# Patient Record
Sex: Male | Born: 1945
Health system: Southern US, Community
[De-identification: ages and names within clinical notes are randomized; demographics above are authoritative.]

## PROBLEM LIST (undated history)

## (undated) DIAGNOSIS — Z952 Presence of prosthetic heart valve: Secondary | ICD-10-CM

## (undated) DIAGNOSIS — R0600 Dyspnea, unspecified: Secondary | ICD-10-CM

## (undated) DIAGNOSIS — M7989 Other specified soft tissue disorders: Secondary | ICD-10-CM

## (undated) DIAGNOSIS — I10 Essential (primary) hypertension: Secondary | ICD-10-CM

## (undated) DIAGNOSIS — I4891 Unspecified atrial fibrillation: Secondary | ICD-10-CM

## (undated) DIAGNOSIS — I499 Cardiac arrhythmia, unspecified: Secondary | ICD-10-CM

## (undated) DIAGNOSIS — G4733 Obstructive sleep apnea (adult) (pediatric): Secondary | ICD-10-CM

## (undated) DIAGNOSIS — I82409 Acute embolism and thrombosis of unspecified deep veins of unspecified lower extremity: Secondary | ICD-10-CM

## (undated) HISTORY — PX: CARDIAC SURGERY: SHX584

## (undated) HISTORY — DX: Obstructive sleep apnea (adult) (pediatric): G47.33

## (undated) HISTORY — PX: EYE SURGERY: SHX253

## (undated) HISTORY — PX: THYROIDECTOMY, PARTIAL: SHX18

## (undated) HISTORY — DX: Other specified soft tissue disorders: M79.89

## (undated) HISTORY — PX: OTHER SURGICAL HISTORY: SHX169

## (undated) HISTORY — DX: Presence of prosthetic heart valve: Z95.2

---

## 2007-04-08 HISTORY — PX: NM MYOVIEW LTD: HXRAD82

## 2007-05-03 ENCOUNTER — Encounter: Admission: RE | Admit: 2007-05-03 | Discharge: 2007-05-03 | Payer: Self-pay | Admitting: *Deleted

## 2007-05-10 ENCOUNTER — Encounter: Payer: Self-pay | Admitting: Surgery

## 2007-05-10 ENCOUNTER — Ambulatory Visit (HOSPITAL_COMMUNITY): Admission: RE | Admit: 2007-05-10 | Discharge: 2007-05-10 | Payer: Self-pay | Admitting: *Deleted

## 2007-05-10 HISTORY — PX: CARDIAC CATHETERIZATION: SHX172

## 2007-05-14 ENCOUNTER — Ambulatory Visit: Payer: Self-pay | Admitting: Surgery

## 2007-05-16 ENCOUNTER — Ambulatory Visit: Admission: RE | Admit: 2007-05-16 | Discharge: 2007-05-16 | Payer: Self-pay | Admitting: Surgery

## 2007-05-22 ENCOUNTER — Encounter: Payer: Self-pay | Admitting: Surgery

## 2007-05-22 ENCOUNTER — Inpatient Hospital Stay (HOSPITAL_COMMUNITY): Admission: RE | Admit: 2007-05-22 | Discharge: 2007-05-27 | Payer: Self-pay | Admitting: Surgery

## 2007-05-23 ENCOUNTER — Ambulatory Visit: Payer: Self-pay | Admitting: Surgery

## 2007-06-11 ENCOUNTER — Encounter: Admission: RE | Admit: 2007-06-11 | Discharge: 2007-06-11 | Payer: Self-pay | Admitting: Surgery

## 2007-06-11 ENCOUNTER — Ambulatory Visit: Payer: Self-pay | Admitting: Surgery

## 2007-06-20 ENCOUNTER — Encounter (HOSPITAL_COMMUNITY): Admission: RE | Admit: 2007-06-20 | Discharge: 2007-09-08 | Payer: Self-pay | Admitting: *Deleted

## 2007-07-16 ENCOUNTER — Ambulatory Visit: Payer: Self-pay | Admitting: Surgery

## 2007-08-02 ENCOUNTER — Ambulatory Visit (HOSPITAL_COMMUNITY): Admission: RE | Admit: 2007-08-02 | Discharge: 2007-08-02 | Payer: Self-pay | Admitting: *Deleted

## 2007-08-02 HISTORY — PX: CARDIOVERSION: SHX1299

## 2007-10-25 DIAGNOSIS — G4733 Obstructive sleep apnea (adult) (pediatric): Secondary | ICD-10-CM

## 2007-10-25 HISTORY — DX: Obstructive sleep apnea (adult) (pediatric): G47.33

## 2008-08-17 ENCOUNTER — Ambulatory Visit (HOSPITAL_COMMUNITY): Admission: RE | Admit: 2008-08-17 | Discharge: 2008-08-17 | Payer: Self-pay | Admitting: Cardiology

## 2009-08-22 ENCOUNTER — Encounter (INDEPENDENT_AMBULATORY_CARE_PROVIDER_SITE_OTHER): Payer: Self-pay | Admitting: Emergency Medicine

## 2009-08-22 ENCOUNTER — Ambulatory Visit: Payer: Self-pay | Admitting: Surgery

## 2009-08-22 ENCOUNTER — Emergency Department (HOSPITAL_COMMUNITY): Admission: EM | Admit: 2009-08-22 | Discharge: 2009-08-22 | Payer: Self-pay | Admitting: Emergency Medicine

## 2009-12-03 ENCOUNTER — Ambulatory Visit (HOSPITAL_COMMUNITY): Admission: RE | Admit: 2009-12-03 | Discharge: 2009-12-03 | Payer: Self-pay | Admitting: Gastroenterology

## 2010-06-27 LAB — URINALYSIS, ROUTINE W REFLEX MICROSCOPIC
Bilirubin Urine: NEGATIVE
Glucose, UA: NEGATIVE mg/dL
Hgb urine dipstick: NEGATIVE
Ketones, ur: NEGATIVE mg/dL
Nitrite: NEGATIVE
Protein, ur: NEGATIVE mg/dL
Specific Gravity, Urine: 1.011 (ref 1.005–1.030)
Urobilinogen, UA: 0.2 mg/dL (ref 0.0–1.0)
pH: 8 (ref 5.0–8.0)

## 2010-06-27 LAB — POCT I-STAT, CHEM 8
BUN: 13 mg/dL (ref 6–23)
Calcium, Ion: 1.18 mmol/L (ref 1.12–1.32)
Chloride: 105 mEq/L (ref 96–112)
Creatinine, Ser: 0.8 mg/dL (ref 0.4–1.5)
Glucose, Bld: 93 mg/dL (ref 70–99)
HCT: 38 % — ABNORMAL LOW (ref 39.0–52.0)
Hemoglobin: 12.9 g/dL — ABNORMAL LOW (ref 13.0–17.0)
Potassium: 4.3 mEq/L (ref 3.5–5.1)
Sodium: 141 mEq/L (ref 135–145)
TCO2: 27 mmol/L (ref 0–100)

## 2010-06-27 LAB — PROTIME-INR
INR: 3.79 — ABNORMAL HIGH (ref 0.00–1.49)
Prothrombin Time: 37.1 seconds — ABNORMAL HIGH (ref 11.6–15.2)

## 2010-06-27 LAB — APTT

## 2010-08-23 NOTE — Assessment & Plan Note (Signed)
OFFICE VISIT   TRAVER, MECKES  DOB:  05-28-1945                                        June 11, 2007  CHART #:  [id number]   Mr. Astacio returned today for a followup status post coronary artery  bypass graft surgery x2 and Bentall procedure with a 25 mm St. Jude  mechanical valve conduit on May 22, 2007.  His postoperative course  was uncomplicated and he was discharged home on Coumadin.  His INR has  been followed in our office since surgery.  He is currently on 7.5 mg of  Coumadin per day and his last INR checked on Monday was 2.4.  He has  been walking daily without chest pain or shortness of breath.   PHYSICAL EXAMINATION:  His blood pressure is 129/77, pulse 58 and  regular.  Respiratory rate is 18, unlabored.  Oxygen saturation on room  air is 98%.  He looks well.  CARDIAC:  Shows a regular rate and rhythm with normal heart sounds.  There is a crisp mechanical valve click.  There is no murmur.  LUNGS:  Clear.  The chest incision is healing well and the sternum is  stable.  His leg incision is healing well and there is no lower extremity edema.   Followup chest x-ray today shows clear lung fields and no pleural  effusions.   MEDICATIONS:  1. Ramipril 10 mg daily.  2. Bystolic 2.5 mg daily.  3. Lipitor 10 mg daily.  4. Fluticasone nasal spray p.r.n.  5. Baby aspirin 81 mg daily.  6. Coumadin 7.5 mg daily.  7. Multivitamin daily.  8. Vitamin C 1000 mg daily.  9. Calcium with vitamin D 2400 mg daily.  10.Fish oil 1000 mg daily.  11.He had been on Lipitor 20 mg daily prior to surgery and I asked him      to restart that dose.   He has not been taking his oxycodone IR because it upset his stomach and  I wrote him a prescription today for Ultram 50 mg p.o. q.6 hours p.r.n.  for pain #30.   Overall, Mr. Newbury is recovering well following his surgery.  I  encouraged him to continue walking as much as possible. He is interested  in starting  cardiac rehab.  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  He works as a  [employer] and said that he has to lift about 100 pounds at work. I  told him he would have to wait three months to do that.  We will check  his coagulation profile again on June 13, 2007, and if his INR is stable  within the therapeutic window, then I will ask Dr. Mikey Bussing office to  follow his anticoagulation profile long-term.  I will plan to see him  back in about one month.   Evelene Croon, M.D.  Electronically Signed   BB/MEDQ  D:  06/11/2007  T:  06/11/2007  Job:  [id number]   cc:   Darlin Priestly, MD

## 2010-08-23 NOTE — Cardiovascular Report (Signed)
NAMESHOTA, KOHRS NO.:  [id number]   MEDICAL RECORD NO.:  [id number]          PATIENT TYPE:  OIB   LOCATION:  2859                         FACILITY:  [facility]   PHYSICIAN:  Darlin Priestly, MD  DATE OF BIRTH:  May 11, 1945   DATE OF PROCEDURE:  08/02/2007  DATE OF DISCHARGE:                            CARDIAC CATHETERIZATION   PROCEDURE:  DC cardioversion.   ATTENDING PHYSICIAN:  Darlin Priestly, MD.   COMPLICATIONS:  None.   INDICATIONS:  Mr. Arps is a 65 year old male with a history of ascending  aortic aneurysm with moderate-to-severe aortic insufficiency.  He  underwent a Bentall procedure with St. Jude aortic valve replacement.  Postop, he developed AFib.  He has been loaded on amiodarone and failed  to convert chemically.  He is now brought for a DC cardioversion.   DESCRIPTION OF PROCEDURE:  After getting informed consent, the patient  was brought to endoscopy suite in fasting state.  He then underwent DC  cardioversion.  He received 150 Joule biphasic shock with restoration of  sinus rhythm.  Heart rate was 60.  He remained hemodynamically stable.  He woke in satisfactory condition.   CONCLUSION:  Successful DC cardioversion to sinus rhythm.      Darlin Priestly, MD  Electronically Signed     RHM/MEDQ  D:  08/02/2007  T:  08/02/2007  Job:  [id number]

## 2010-08-23 NOTE — Op Note (Signed)
NAMEDOLORES, MCGOVERN NO.:  [id number]   MEDICAL RECORD NO.:  [id number]          PATIENT TYPE:  INP   LOCATION:  2006                         FACILITY:  [facility]   PHYSICIAN:  Evelene Croon, M.D.     DATE OF BIRTH:  1945-08-08   DATE OF PROCEDURE:  05/22/2007  DATE OF DISCHARGE:                               OPERATIVE REPORT   PREOPERATIVE DIAGNOSIS:  Ascending aortic aneurysm with severe aortic  insufficiency and two vessel coronary disease.   POSTOPERATIVE DIAGNOSIS:  Ascending aortic aneurysm with severe aortic  insufficiency and two vessel coronary disease.   OPERATIVE PROCEDURE:  Median sternotomy, extracorporeal circulation,  coronary bypass graft surgery x2 using saphenous vein grafts to the  obtuse marginal branch of the left circumflex coronary and the right  coronary artery; replacement of the aortic valve and ascending aortic  aneurysm using a 25 mm St. Jude mechanical valve conduit with  reimplantation of the right and left coronary artery using deep  hypothermic circulatory arrest (Bentall procedure) endoscopic vein  harvesting from the left leg.   SURGEON:  Evelene Croon, M.D.   ASSISTANT:  Rowe Clack, P.A.-C.   ANESTHESIA:  General endotracheal.   CLINICAL HISTORY:  This patient is a 65 year old gentleman with a  history of hypertension and known moderate aortic insufficiency with a  mildly dilated aortic root that had been followed with yearly  echocardiograms and CT scans.  Previous studies have shown normal left  ventricular function and no significant ischemia.  A repeat  echocardiogram on April 08, 2007, showed a mildly dilated left  ventricle with left ventricular systolic dimension that had now  increased to 4.5 cm which was about 1 cm larger than last year.  Ejection fraction remained normal.  He was found have moderate to severe  aortic insufficiency which had progressed.  There was mild mitral  regurgitation.  The aortic root  diameter was measured at 5.2 cm in the  past and repeat scan showed that increased to 5 x 5.5 cm.  There was no  evidence of aortic dissection.  CT scan of the abdomen and pelvis also  showed ectasia of the iliac arteries but no abdominal aortic aneurysm.  He underwent cardiac catheterization by Dr. Jenne Campus on May 10, 2007.  This showed the left main to be patent without significant disease.  The  LAD was a large vessel that had an insignificant 30% proximal narrowing.  There was a diagonal branch that had about 60% ostial stenosis in one  view.  The left circumflex was a large vessel that gave off one large  bifurcating branch.  There is about a 75% stenosis in the AV groove  portion of the left circumflex compromising this branch.  The right  coronary artery was a small to medium size vessel that was dominant.  There was 99% mid vessel stenosis as well as sequential 90% lesion in  the distal portion extending into the posterior descending branch.  The  posterolateral branch was not visualized off the right coronary artery  and collateralized from the AV groove portion of  the left circumflex.  Ejection fraction was about 50%.  Aortography showed a severely dilated  aortic root with moderate to severe aortic insufficiency.  Abdominal  aortogram showed normal renal arteries.  There were two small infrarenal  aortic aneurysms.  The iliac arteries appeared dilated and tortuous.  PA  pressure was 31/10 with a wedge pressure of 10 and a cardiac index of  2.7.  After review of these studies and examination of the patient in my  office, I felt that it would be best to proceed with coronary bypass to  the left circumflex and right coronary territories as well as  replacement of his aortic valve and the ascending aortic aneurysm using  a mechanical valve conduit.  I discussed the options for valve  replacement with he and his wife including mechanical and tissue valves  and I recommended a  mechanical valve given his age.  I discussed the  operative procedure in detail with them including alternatives,  benefits, and risks including but not limited to bleeding, blood  transfusion, infection, stroke, myocardial infarction, graft failure,  organ failure, and death.  They understood and agreed to proceed.   OPERATIVE PROCEDURE:  The patient was taken to the operating room and  placed on the table in a supine position.  After induction of general  endotracheal anesthesia, a Foley catheter was placed in the bladder  using sterile technique.  Preoperative intravenous antibiotics were  given.  Then, transesophageal echocardiogram was performed.  This showed  severe aortic insufficiency.  There was no aortic stenosis.  There was a  markedly dilated ascending aorta which was the largest in the aortic  root.  There was no significant mitral regurgitation.  There was mild  tricuspid regurgitation.  Left ventricular function appeared well  preserved.   Then, the chest, abdomen and both lower extremities were prepped and  draped in the usual sterile manner.  The chest was opened through a  median sternotomy incision and the pericardium opened in the midline.  Examination of the heart showed good ventricular contractility.  The  ascending aorta was aneurysmal and this was the largest in the aortic  root portion.  At the same time, a segment of greater saphenous vein was  harvested from the left thigh using endoscopic vein harvest technique.  This vein was medium size and good quality.   Then, the patient was heparinized and when an adequate activated  clotting time was achieved, the distal ascending aorta was cannulated  using a 20-French aortic cannula for arterial inflow.  Venous outflow  was achieved using a two stage venous cannula through the right atrial  appendage.  A retrograde cardioplegia cannula was inserted through the  right atrium and the coronary sinus.  A left  ventricular vent was placed  in the right superior pulmonary vein.  A retrograde cerebroplegia  cannula was placed through a pursestring suture in the superior vena  cava.  The superior vena cava was encircled with tape.   The patient was then placed on cardiopulmonary bypass and cooled to a  nasopharyngeal and bladder temperature of 18 degrees centigrade.  The  aorta was then crossclamped and 500 mL of cold blood retrograde  cardioplegia was administered with quick arrest of the heart.  Topical  hypothermia with iced saline was used.  A temperature probe was placed  in the septum and insulating pad in the pericardium.   While the patient was cooling to 18 degrees centigrade, the coronary  distal anastomoses  were performed.  The first anastomosis was performed  to the obtuse marginal branch of the left circumflex coronary.  The  internal diameter was 1.75 mm.  The conduit used was a segment of  greater saphenous vein and the anastomosis was performed in an end-to-  side manner using continuous 7-0 Prolene suture.  Flow was noted through  the graft and was excellent.   The second distal anastomosis was performed to the distal right  coronary.  The internal diameter was about 1.6 mm.  The posterior  descending and posterolateral branches, themselves, were small non-  graftable vessels.  The conduit used was a second segment of greater  saphenous vein.  The anastomosis was performed in an end-to-side manner  using continuous 7-0 Prolene suture.  Flow was noted through the graft  and was excellent.  Then, another dose of retrograde cardioplegia was  given.  About this time, the patient had cooled sufficiently to a  nasopharyngeal and bladder temperature of 18 degrees centigrade.  The  patient was given 200 mg of Solu-Medrol and a dose of Pentothal by  anesthesiology.  The head was packed in ice.  The patient was placed in  a steep Trendelenburg position.  Then, the circulatory arrest was  begun  and the patient's blood volume emptied back into the pump.  The aortic  cannula was removed.  The distal aorta was transected just proximal to  the innominate artery.  The aorta had normal caliber at this location.  Retrograde cerebroplegia was begun at the time of circulatory arrest and  continued throughout the circulatory arrest period.  There was good flow  of the retrograde cerebroplegia out of the head vessels.  Then, a 30 mm  Hemashield platinum tube graft with 10 mm sidearm was cut to the  appropriate length.  This was anastomosed end-to-end to the proximal  aortic arch using continuous 3-0 Prolene suture with a felt strip to  reinforce the anastomosis.  It was lightly coated with CoSeal for  hemostasis.  Then, the arterial line was connected to the sidearm of  this graft.  Circulation was slowly resumed and the graft was  crossclamped just proximal to the sidearm to allow perfusion of the  body.  Total circulatory arrest time was 18 minutes.  Then, the patient  was rewarmed to 28 degrees centigrade.  Another dose of retrograde  cardioplegia was given and additional doses were given at about 20  minute intervals to maintain myocardial temperature around 10 degrees  centigrade.  The heart was packed in ice throughout the procedure.   Then, the ascending aorta was opened down the midline.  The right and  left coronary ostia were identified.  The right coronary artery had two  ostia, one was the primary ostia and one was a small vessel.  They were  lying fairly close to each other and, therefore, were excised as a  single coronary button.  The left coronary ostium was also excised as a  button.  This was of normal caliber.  There was some calcium around the  ostium but no stenosis.  These were retracted out of the way and marked  to prevent rotation.  Then, the native valve was excised.  This valve  appeared fairly normal except that it was stretched open by the aortic  root  aneurysm.  Then, a series of pledgeted 2-0 Ethibond horizontal  mattress sutures were placed around the aortic annulus with the pledgets  in a subannular position.  The annulus was sized and a 25 mm St. Jude  mechanical valve conduit was chosen.  This had model number [id number],  serial number [id number].  The sutures were then placed through a strip of  pericardium for reinforcement of the annulus and then through the sewing  ring.  The valve was lowered into place and the sutures tied  sequentially.  The valve seated nicely.  The leaflets were moving  normally.   Then, the left coronary button was anastomosed to the side of the Dacron  graft in an end-to-side manner using continuous 5-0 Prolene suture.  Then, the graft was cut to the appropriate length and anastomosed end-to-  end to the distal aortic graft using continuous 3-0 Prolene suture.  This anastomosis was also coated with CoSeal for hemostasis.  Then, the  graft was filled with blood and the position of the right coronary  anastomosis was marked.  A small opening was made using hot cautery and  then the right coronary button was anastomosed to the graft in an end-to-  side manner using continuous 5-0 Prolene suture.   Then, the two proximal vein graft anastomoses were anastomosed to the  Dacron graft in an end-to-side manner using continuous 6-0 Prolene  suture.  The patient was rewarmed to 37 degrees centigrade.  The left  side of the heart was then de-aired and the head placed in Trendelenburg  position.  The crossclamp was removed with a time of 174 minutes.  There  was spontaneous return of a complete heart block rhythm.  The proximal  and distal anastomoses appeared hemostatic and the life of the grafts  satisfactory.  The coronary buttons appeared hemostatic.  All those  suture lines appeared hemostatic.  Vein graft markers were placed around  the proximal anastomoses.  Two temporary right ventricular and right  atrial  pacing wires were placed and brought through the skin.   When the patient had been rewarmed to 37 degrees centigrade, he was AV  paced at 80 beats per minute and was weaned from cardiopulmonary bypass  on low dose dopamine.  Total bypass time was 214 minutes.  Cardiac  function appeared good with a cardiac output of 5 liters per minute.  Transesophageal echocardiogram showed normal function of the mechanical  valve.  Left ventricular function appeared well-preserved.  Right  ventricular function appeared normal.  Then, protamine was given and the  venous and aortic cannulae were removed without difficulty.  The patient  was given platelet transfusion due to thrombocytopenia with a platelet  count of 90,000 and obvious coagulopathy.  This resulted in good  hemostasis.  Then, two chest tubes were placed, two in the posterior  pericardium and one in the anterior mediastinum.  The sternum was closed  with the double #6 stainless steel wires.  The fascia was closed with  continuous #1 Vicryl suture.  The subcutaneous tissue was closed with  continuous 2-0 Vicryl and the skin with 3-0 Vicryl subcuticular closure.  The lower extremity vein harvest site was closed in layers  in a similar manner.  The sponge, needle, and instrument counts were  correct according to the scrub nurse.  A dry sterile dressing was  applied over the incisions and around the chest tubes which were hooked  to Pleur-Evac suction.  The patient remained hemodynamically stable and  was transferred to the SICU in guarded but stable condition.      Evelene Croon, M.D.  Electronically Signed     BB/MEDQ  D:  05/22/2007  T:  05/23/2007  Job:  [id number]   cc:   Darlin Priestly, MD

## 2010-08-23 NOTE — Consult Note (Signed)
NEW PATIENT CONSULTATION   Marcus [name], Marcus [name]  DOB:  [birth year]                                        May 14, 2007  CHART #:  [id number]   DATE OF CONSULTATION:  05/14/2007   REFERRING PHYSICIAN:  Darlin Priestly, MD   REASON FOR CONSULTATION:  Aortic root aneurysm and severe aortic  insufficiency and two-vessel coronary artery disease.   CLINICAL HISTORY:  I was asked by Dr. Jenne [name] to evaluate Marcus [name] for  consideration of treatment of an ascending aortic aneurysm with severe  aortic insufficiency and significant two-vessel coronary artery disease.  He is a 65 year old gentleman with a history of hypertension and known  moderate aortic insufficiency with mildly dilated aortic root that has  been having yearly follow up with echocardiogram and CT scan.  Previous  studies have shown normal left ventricular function and no significant  ischemia.  A repeat echocardiogram was done April 08, 2007 that  showed a mildly dilated left ventricle with an LV systolic dimension  that was now at 4.5 cm which is increased to about a centimeter over the  past year.  Ejection fraction remained normal.  He was found to have  moderate to severe aortic insufficiency which had progressed.  There is  mild mitral regurgitation.  The aortic root diameter was measured at 5.2  cm in the past, and repeat CT scan of the chest on May 06, 2007  showed an aortic-root diameter of 5 cm x 5.5 cm.  There is no evidence  of aortic dissection.  CT scan of the abdomen and pelvis was also done  which showed some ectasia of the iliac arteries but no abdominal aortic  aneurysm.  He underwent cardiac catheterization on 05/10/2007.  This  showed the left main to be without significant disease.  The LAD was a  large vessel that had insignificant 30% proximal narrowing.  There was a  diagonal branch with a 60% osteal stenosis.  The left circumflex was a  medium-sized vessel that gave one  large bifurcating branch.  There  appeared to be 75% stenosis in the AV groove portion of the left  circumflex compromising a large marginal branch.  The right coronary  artery was a small to medium-sized vessel that was dominant.  There was  99% mid vessel stenosis as well as sequential 90% lesions in the distal  portion extending in the posterior ascending branch.  There is a  posterolateral branch that was not visualized off the right coronary  artery and collateralized from the AV groove portion of the left  circumflex.  Ejection fraction was about 50%.  Aortography showed  severely dilated aortic root with moderate to severe aortic  insufficiency.  Abdominal aortogram showed normal renal arteries.  There  were two small intrarenal aneurysms present.  The iliacs appeared  dilated and tortuous.  PA pressure was 31/10 with a wedge pressure of  10.  Cardiac index was 2.7.   REVIEW OF SYSTEMS:  GENERAL:  He denies any fever or chills.  He has had  no recent weight changes.  He denies fatigue.  HEENT:  Eyes:  Negative.  ENT:  Negative.  ENDOCRINE:  He denies diabetes and hypothyroidism.  CARDIOVASCULAR:  He denies any chest pain or pressure.  He has had  exertional dyspnea.  He has some  arrhythmia with PVCs noted on previous  tracings.  He denies peripheral edema.  He denies PND and orthopnea.  RESPIRATORY:  He denies cough and sputum production.  GI:  He has had no  nausea or vomiting.  He denies melena and bright red blood per rectum.  GU:  He denies dysuria and hematuria.  VASCULAR:  He denies claudication  and phlebitis.  He has never had a DVT.  NEUROLOGIC:  He denies any  focal weakness or numbness.  He denies dizziness and syncope.  He never  had a TIA or a stroke.  MUSCULOSKELETAL:  He denies arthralgias and  myalgias.  PSYCHIATRIC:  Negative.  HEMATOLOGICAL:  Negative.   ALLERGIES:  CRESTOR CAUSES CRAMPS, VYTORIN CAUSES SLEEPINESS, PENICILLIN  CAUSED SWELLING AND DIFFICULTY  BREATHING.   MEDICATIONS:  1. Ramipril 10 mg daily.  2. Bystolic 2.5 mg daily.  3. Cardia XL 120 mg daily.  4. Lipitor 20 mg daily.  5. Fluticasone nasal spray daily.  6. Aspirin 81 mg daily.  7. He also uses Vitamin C 1000 mg daily.  8. Calcium 2400 mg daily.  9. Fish oil 1000 mg daily with a multivitamin.   PAST MEDICAL HISTORY:  Significant for hyperlipidemia.  He has a history  of hypertension.  He has a history of dilated aortic root with aortic  insufficiency as noted above.  He has had previous arthroscopy on his  knee.   SOCIAL HISTORY:  He is married and has two children.  He works as a  [employer].  He quit smoking in 1989.  He drinks two or three beers a  week.   FAMILY HISTORY:  Negative for cardiac disease.  There is no history of  aneurysm disease or valve disease.   PHYSICAL EXAMINATION:  VITAL SIGNS:  His blood pressure today is 130/71,  pulse of 58 and regular.  Respiratory rate is 18 and unlabored.  Oxygen  saturation on room air is 98%.  GENERAL:  He is a well-developed white male in no distress.  HEENT EXAM:  Shows him to be normocephalic and atraumatic.  Pupils were  equal and reactive to light and accommodation.  Extraocular muscles are  intact.  His throat is clear.  Teeth are in good condition.  NECK EXAM:  Shows normal carotid pulses bilaterally.  There are no  bruits.  There is no adenopathy or thyromegaly.  CARDIAC EXAM:  Shows a regular rate and rhythm with a normal S1 and a  soft S2.  There is a grade 1/6 systolic murmur over the aorta.  There is  a grade 2/6 diastolic murmur at the apex.  LUNGS:  Clear.  ABDOMINAL EXAM:  Shows active bowel sounds.  His abdomen is soft and  nontender.  No palpable masses or organomegaly.  EXTREMITY EXAM:  Shows no peripheral edema.  Pedal pulses are palpable  bilaterally.  SKIN:  Warm and dry.  NEUROLOGIC EXAM:  Shows him to be alert and oriented x3.  Motor and  sensory exams are grossly normal.   Carotid  Doppler exam shows no internal artery stenosis bilaterally.   My review of his CT angiogram of his chest shows that the diameter of  the aortic root is 5 cm x 5.5 cm.  This dilation extends up to the  proximal aortic arch with a diameter just proximal to the innominate  artery about 4 cm.  The aortic arch is mildly enlarged, and the  descending thoracic aorta is mildly enlarged throughout.  IMPRESSION:  Mr. Leabo has a large aortic root aneurysm extending up to  the proximal aortic arch.  He has moderate to severe aortic  insufficiency and significant two-vessel coronary artery disease.  I  think the lesion in his left circumflex and right coronary artery are  significant and should be bypassed.  He does have some mild osteal  narrowing at the takeoff of his diagonal branch which Dr. Jenne [name]  estimated at about 60%.  This does not really look significant to me.  I  think the best treatment would be replacement of his aortic valve and  ascending aorta using a mechanical valve conduit up to the undersurface  of his aortic arch.  He should also have coronary bypass to the right  coronary circulation and left circumflex.  His surgery would require  hypothermic circulatory arrest for replacement to the undersurface of  his aortic arch.  I discussed the operative procedure in detail with the  patient and his wife including alternatives, benefits, and risks  including but not limited to bleeding, blood transfusion, infection,  stroke, myocardial  infarction, graft failure, organ failure, and death.  He understands and  would like to proceed as quickly as possible.  We will plan to do this  on Thursday, 05/16/2007.   Evelene Croon, M.D.  Electronically Signed   BB/MEDQ  D:  05/14/2007  T:  05/15/2007  Job:  [id number]   cc:   Marcus Priestly, MD

## 2010-08-23 NOTE — Cardiovascular Report (Signed)
Marcus [name], Marcus [name] NO.:  [id number]   MEDICAL RECORD NO.:  [id number]          PATIENT TYPE:  OIB   LOCATION:  2871                         FACILITY:  [facility]   PHYSICIAN:  Darlin Priestly, MD  DATE OF BIRTH:  [birth year]   DATE OF PROCEDURE:  05/10/2007  DATE OF DISCHARGE:                            CARDIAC CATHETERIZATION   PROCEDURE:  1. Right heart catheterization.  2. Left heart catheterization.  3. Coronary angiography.  4. Left ventriculogram.  5. Ascending aortography.  6. Abdominal aortogram..   COMPLICATIONS:  None.   INDICATIONS:  Ms. Brow is a 65 year old male with a history of ascending  aortic aneurysm, moderate aortic insufficiency and hypertension who we  have been following via echocardiogram and CT scans for aortic aneurysm  and aortic valve insufficiency.  He is recently noted to have an  increasing LV systolic diameter with a slightly enlarged aortic root and  increasing aortic insufficiency.  He is now brought for cardiac  catheterization to evaluated his coronary anatomy as well as aortic  valve in preparation for possible aortic valve, aortic root and possible  coronary grafting.   DESCRIPTION:  After informed consent, the patient brought to the cardiac  cath lab.  Right groin shaved, prepped and draped in sterile fashion.  Monitors were established.  Using modified Seldinger technique a 7-  French venous sheath inserted into the right femoral vein, #6-French  arterial sheath right femoral artery.  Next under fluoroscopic guidance,  #7-French Swan-Ganz catheter floated to the RA, RV, PA wedge position.  Hemodynamic measures were obtained.   A 6-French diagnostic catheters was used to perform diagnostic  angiography.  It should be noted that a 6-French JL 6 catheter was used  to engage the left main.   Left main was a medium-to-large vessel which is calcified with no  significant disease.   The LAD is a large vessel which  courses to the apex using one diagonal  branch.  The LAD is noted to be calcified throughout its proximal and  mid segment with up to 30% proximal and early mid stenosis.  There is no  further significant disease in the remainder of the LAD.   First diagonal is a medium-size vessel with 60% ostial lesion.   Left circumflex is a medium-size vessel coursing to the AV groove and  goes to one large bifurcating obtuse marginal branch.  There is  calcification noted in the first OM.  The AV groove circumflex does give  a large nodal branch which collateralized the distal posterolateral  branch which is a large vessel.   The first OM is a large vessel which bifurcates in its mid segment.  There is a 50% proximal stenosis.   The right coronary artery is a small to medium size vessel which is  dominant.  There is a 99% midvessel lesion as well as a sequential 90%  lesion in the distal portion of the RCA extending into the PDA.  The PLA  is not visualized off the right coronary.  This is a large vessel which  is collateralized from  the AV groove circumflex.   Left ventriculogram reveals a moderately dilated left ventricle with EF  approximately 50%.   Ascending aortography reveals a severely dilated aortic root with  moderate to severe aortic insufficiency.   Abdominal aortogram reveals normal renal arteries.  There are two small  infrarenal aneurysms present.  The iliacs appear dilated and tortuous.   HEMODYNAMICS:  RA 4, RV 31/2, PA 31/10, pulmonary capillary wedge  pressure 10, systemic arterial pressure 112/58, LV system pressure  115/3, LVEDP of 13, cardiac output 5.7, cardiac index 2.7, PA saturation  69%, AO saturation 96%.   CONCLUSIONS:  1. Significant one-vessel CAD.  2. Mildly dilated left ventricle with low normal EF.  3. Severely dilated aortic root with moderate severe aortic      insufficiency.  4. Normal renal arteries.  5. Small infrarenal aneurysm with tortuous and  dilated iliacs      bilaterally.  6. Normal right heart pressures.  7. Cardiac output 5.7, cardiac index 2.7.  8. PA saturation is 69%, AO saturation 96%.      Darlin Priestly, MD  Electronically Signed     RHM/MEDQ  D:  05/10/2007  T:  05/10/2007  Job:  [id number]

## 2010-08-23 NOTE — Assessment & Plan Note (Signed)
OFFICE VISIT   SARON, TWEED  DOB:  23-Jun-1945                                        July 16, 2007  CHART #:  [id number]   Marcus [name] returns today for followup status post coronary bypass graft  surgery x2 and Bentall procedure with a 25-mm St. Jude mechanical valve  conduit on May 22, 2007.  We had been following his INR in our  office since surgery.  He is currently taking Coumadin 7.5 mg per day.  Last INR on April 1 was 3.1.  He is walking daily without chest pain or  shortness of breath.  His only complaint is a mild left lower leg  swelling which is the side where he had saphenous vein harvested.  He is  anxious to return to work.   PHYSICAL EXAMINATION:  Vital signs:  Today his blood pressure is 144/86,  and his pulse is 64 and irregular.  Respiratory rate is 18 unlabored.  Oxygen saturation on room air is 98%. General:  He looks well.  Cardiac:  Exam shows a regular rate and rhythm with frequent premature beats.  There is a crisp valve click.  There is no murmur.  Lungs:  Clear.  The  incision is healing well, and the sternum is stable.  Extremities:  His  left leg incision is well healed, and there is minimal edema in the left  ankle.   MEDICATIONS:  1. Ramipril 10 mg daily.  2 . Bystolic 2.5 mg daily.  1. Lipitor 20 mg daily.\  2. Baby aspirin 81 mg daily.  5 . Coumadin 7.5 mg daily.  1. He is also taking vitamin C 1000 mg daily.  2. Calcium with vitamin D 2400 mg daily.  3. Fish oil 1000 mg daily.  4. Vitamin daily.   IMPRESSION:  Overall, Marcus [name] is making a very good recovery following  his surgery.  His INR is staying within the therapeutic range on 7.5 mg  per day.  He does have an irregular rhythm and said that Dr. Mikey Bussing  office is following that.  He sounds like he is in sinus rhythm with  frequent premature beats today.  I asked him to continue his present  dose of Coumadin, and we are planning on checking him on July 17, 2007.  We will then transfer long-term monitoring of his coagulation profile to  Dr. Mikey Bussing office.  Hopefully the patient can get on home testing  after 3 months.  I told him he can continue to increase his activity  level at home.  I have asked him not to lift anything heavier than 10  pounds for a total of 3 months from date of surgery.  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  He is making plans to  perform his return-to-work physical exam at that time.  I will plan to  see him back just before that to check his incisions and give him final  clearance to return to work.   Evelene Croon, M.D.  Electronically Signed   BB/MEDQ  D:  07/16/2007  T:  07/16/2007  Job:  [id number]   cc:   Darlin Priestly, MD

## 2010-08-23 NOTE — Discharge Summary (Signed)
Marcus [name], Marcus [name] NO.:  [id number]   MEDICAL RECORD NO.:  [id number]          PATIENT TYPE:  INP   LOCATION:  2006                         FACILITY:  [facility]   PHYSICIAN:  Evelene Croon, M.D.     DATE OF BIRTH:  [birth year]   DATE OF ADMISSION:  05/22/2007  DATE OF DISCHARGE:  05/27/2007                               DISCHARGE SUMMARY   REASON FOR ADMISSION:  The patient is a 65 year old white male referred  to Dr. Laneta Simmers in thoracic surgical consultation for an aortic root  aneurysm and severe aortic insufficiency with two-vessel coronary artery  disease.   HISTORY OF PRESENT ILLNESS:  The patient is a 65 year old gentleman with  a history of hypertension and known moderate aortic insufficiency with  mildly dilated aortic root that had been having yearly follow-up with  echocardiogram and CT scan.  Previous studies have shown normal left  ventricular function and no significant ischemia.  An echocardiogram  done in December 2008 showed a mildly dilated left ventricle with an LV  systolic dimension that was now at 4.5 cm, which has increased about a  centimeter over the past year.  Ejection fraction remained normal.  He  was found to have moderate to severe aortic insufficiency, which has  progressed.  There is mild mitral regurgitation. The aortic root  diameter was measured at 5.2 cm in the past and a repeat CT scan of the  chest on May 06, 2007, showed the aortic root diameter to be 5 x 5.5  cm.  There was no evidence of aortic dissection.  There was also a CT  scan of the abdomen and pelvis which showed some ectasia of the iliac  arteries but no abdominal aortic aneurysm.  He underwent a cardiac  catheterization on May 10, 2007.  This showed a left main with no  significant disease.  The LAD was a large vessel but had an  insignificant 30% proximal narrowing.  There was a diagonal branch with  a 60% ostial stenosis.  The left circumflex was a  medium-sized vessel  that gave one large bifurcating branch.  There appeared to be a 75%  stenosis at the AV groove of the left circumflex, compromising a large  marginal branch.  The right coronary artery was a small to medium-sized  vessel that was dominant.  There was a 99% midvessel stenosis as well as  a 90% lesion in the distal portion extending to the posterior ascending  branch.  There is a posterolateral branch that was not visualized off of  the right coronary artery and collateralized from the AV groove portion  of the left circumflex.  Ejection fraction was about 50%.  Aortography  showed a severely dilated aortic root with moderate to severe aortic  insufficiency.  Abdominal aortogram showed normal renal arteries.  There  were two intrarenal aneurysms present.  The iliacs appeared dilated and  tortuous.  PA pressure was 31/10 with a wedge pressure of 10.  Cardiac  index was 2.7.  These studies were evaluated, as was the patient, per  Dr. Laneta Simmers,  who agreed with recommendations to proceed with surgical  revascularization and replacement of the aortic valve and aortic root.  He was admitted this hospitalization for the procedure.   ALLERGIES:  CRESTOR causes cramps.  VYTORIN causes sleepiness.  PENICILLIN caused swelling and difficulty breathing in the past.   MEDICATIONS PRIOR TO ADMISSION:  1. Ramipril 10 mg daily.  2. Bystolic 2.5 mg daily.  3. Cartia XL 120 mg daily.  4. Lipitor 20 mg daily.  5. Fluticasone nasal spray daily.  6. Aspirin 81 mg daily.  7. Vitamin C 1000 mg daily.  8. Calcium 2400 mg daily.  9. Fish oil 1000 mg daily with a multivitamin.   PAST MEDICAL HISTORY:  1. Hyperlipidemia.  2. Hypertension.  3. Previous arthroscopy of his knee.   Social history, family history, review of systems and physical exam:  Please see the history and physical done at the time of admission.   HOSPITAL COURSE:  The patient was admitted electively and on May 22, 2007, he was taken to the operating room, at which time he underwent  the following procedure:  Aortic valve replacement and root replacement  with a 25-mm St. Jude mechanical valve conduit.  He additionally  underwent a Bentall procedure and coronary artery bypass grafting x2  with a saphenous vein graft to the obtuse marginal and a saphenous vein  graft to the right coronary artery.  The patient tolerated the procedure  well and was taken to the surgical intensive care unit in stable  condition.   Postoperative hospital course:  The patient has done quite well.  He has  remained hemodynamically stable.  He did have postoperative atrial  fibrillation, which was chemically cardioverted with amiodarone back to  normal sinus rhythm.  All routine lines, monitors and drainage devices  have been discontinued in the standard fashion.  His laboratories do  reveal a stable postoperative acute blood loss anemia.  Most recent  hemoglobin and hematocrit dated May 24, 2007, are 9.4 and 27.2,  respectively.  He has been started postoperatively on Coumadin.  His INR  most recently was 1.2 on May 27, 2007.  This will be followed  closely through the office and home health nursing with adjustments on  his dosing.  His incisions are all healing well without evidence of  infection.  He is tolerating routine activities commensurate for level  of postoperative convalescence using standard protocols.  Oxygen has  been weaned and he maintains good saturations on room air.  Overall his  status is felt to be quite stable for discharge on May 27, 2007.   MEDICATIONS ON DISCHARGE:  1. Aspirin 81 mg daily.  2. Bystolic 2.5 mg daily.  3. Altace 10 mg daily.  4. Lipitor 10 mg daily.  5. Coumadin 5 mg daily and as directed.  6. Amiodarone 400 mg twice daily for 7 days, then 400 mg once daily.  7. For pain oxycodone 5 mg one to two every 4-6 hours as needed.  8. Fluticasone nasal spray.  9.  Multivitamin daily.  10.Fish oil 1000 mg daily.  11.Vitamin C 1000 mg daily.   INSTRUCTIONS:  The patient received written instructions regarding  medications, activity, diet, wound care and follow-up.  Follow-up will  include:   1. Dr. Jenne Campus in two weeks.  He is instructed to arrange this      appointment.  2. Dr. Laneta Simmers on June 11, 2007.   We will arrange a home health nurse to draw  a PT/INR on May 29, 2007, to be sending the results to Dr. Laneta Simmers.   FINAL DIAGNOSES:  1. Aortic insufficiency with aortic root aneurysm, now status post      surgical repair with an aortic valve root conduit and coronary      artery bypass grafting x2.  2. Postoperative atrial fibrillation.  3. Postoperative acute blood loss anemia.  4. Hypertension.  5. Hyperlipidemia.  6. Remote history of tobacco use.  He quit smoking in 1989.  7. History of arthroscopy of his knee.      Rowe Clack, P.A.-C.      Evelene Croon, M.D.  Electronically Signed    WEG/MEDQ  D:  05/27/2007  T:  05/28/2007  Job:  [id number]   cc:   Darlin Priestly, MD

## 2010-10-19 ENCOUNTER — Ambulatory Visit: Payer: Medicare Other | Attending: Orthopedic Surgery | Admitting: Physical Therapy

## 2010-10-19 DIAGNOSIS — R5381 Other malaise: Secondary | ICD-10-CM | POA: Insufficient documentation

## 2010-10-19 DIAGNOSIS — M2560 Stiffness of unspecified joint, not elsewhere classified: Secondary | ICD-10-CM | POA: Insufficient documentation

## 2010-10-19 DIAGNOSIS — M25649 Stiffness of unspecified hand, not elsewhere classified: Secondary | ICD-10-CM | POA: Insufficient documentation

## 2010-10-19 DIAGNOSIS — IMO0001 Reserved for inherently not codable concepts without codable children: Secondary | ICD-10-CM | POA: Insufficient documentation

## 2010-10-24 ENCOUNTER — Ambulatory Visit: Payer: Medicare Other | Admitting: Physical Therapy

## 2010-10-26 ENCOUNTER — Ambulatory Visit: Payer: Medicare Other | Admitting: Physical Therapy

## 2010-10-27 ENCOUNTER — Ambulatory Visit: Payer: Medicare Other | Admitting: Rehabilitation

## 2010-11-01 ENCOUNTER — Ambulatory Visit: Payer: Medicare Other | Admitting: Rehabilitation

## 2010-11-02 ENCOUNTER — Ambulatory Visit: Payer: Medicare Other | Admitting: Physical Therapy

## 2010-11-03 ENCOUNTER — Ambulatory Visit: Payer: Medicare Other | Admitting: Rehabilitation

## 2010-11-07 ENCOUNTER — Ambulatory Visit: Payer: Medicare Other | Admitting: Rehabilitation

## 2010-11-11 ENCOUNTER — Ambulatory Visit: Payer: Medicare Other | Attending: Orthopedic Surgery | Admitting: Rehabilitation

## 2010-11-11 DIAGNOSIS — R5381 Other malaise: Secondary | ICD-10-CM | POA: Insufficient documentation

## 2010-11-11 DIAGNOSIS — M2560 Stiffness of unspecified joint, not elsewhere classified: Secondary | ICD-10-CM | POA: Insufficient documentation

## 2010-11-11 DIAGNOSIS — IMO0001 Reserved for inherently not codable concepts without codable children: Secondary | ICD-10-CM | POA: Insufficient documentation

## 2010-11-11 DIAGNOSIS — M25649 Stiffness of unspecified hand, not elsewhere classified: Secondary | ICD-10-CM | POA: Insufficient documentation

## 2010-11-14 ENCOUNTER — Ambulatory Visit: Payer: Medicare Other | Admitting: Physical Therapy

## 2010-11-16 ENCOUNTER — Encounter: Payer: BC Managed Care – PPO | Admitting: Physical Therapy

## 2010-11-16 ENCOUNTER — Ambulatory Visit: Payer: Medicare Other | Admitting: Physical Therapy

## 2010-11-21 ENCOUNTER — Encounter: Payer: BC Managed Care – PPO | Admitting: Physical Therapy

## 2010-11-23 ENCOUNTER — Encounter: Payer: BC Managed Care – PPO | Admitting: Physical Therapy

## 2010-11-23 ENCOUNTER — Ambulatory Visit: Payer: Medicare Other | Admitting: Physical Therapy

## 2010-11-25 ENCOUNTER — Ambulatory Visit: Payer: Medicare Other | Admitting: Rehabilitation

## 2010-12-06 ENCOUNTER — Ambulatory Visit: Payer: Medicare Other | Admitting: Rehabilitation

## 2010-12-14 ENCOUNTER — Ambulatory Visit: Payer: Medicare Other | Attending: Orthopedic Surgery | Admitting: Physical Therapy

## 2010-12-14 DIAGNOSIS — R5381 Other malaise: Secondary | ICD-10-CM | POA: Insufficient documentation

## 2010-12-14 DIAGNOSIS — M25649 Stiffness of unspecified hand, not elsewhere classified: Secondary | ICD-10-CM | POA: Insufficient documentation

## 2010-12-14 DIAGNOSIS — IMO0001 Reserved for inherently not codable concepts without codable children: Secondary | ICD-10-CM | POA: Insufficient documentation

## 2010-12-14 DIAGNOSIS — M2560 Stiffness of unspecified joint, not elsewhere classified: Secondary | ICD-10-CM | POA: Insufficient documentation

## 2010-12-21 ENCOUNTER — Ambulatory Visit: Payer: Medicare Other | Admitting: Rehabilitation

## 2010-12-28 ENCOUNTER — Encounter: Payer: No Typology Code available for payment source | Admitting: Physical Therapy

## 2010-12-29 LAB — POCT I-STAT 3, VENOUS BLOOD GAS (G3P V)
Acid-base deficit: 1
Bicarbonate: 24.8 — ABNORMAL HIGH
O2 Saturation: 69
Operator id: 149272
TCO2: 26
pCO2, Ven: 43.6 — ABNORMAL LOW
pH, Ven: 7.363 — ABNORMAL HIGH
pO2, Ven: 38

## 2010-12-29 LAB — POCT I-STAT 3, ART BLOOD GAS (G3+)
Acid-Base Excess: 1
Bicarbonate: 24.5 — ABNORMAL HIGH
O2 Saturation: 96
Operator id: 149272
TCO2: 26
pCO2 arterial: 35.6
pH, Arterial: 7.446
pO2, Arterial: 77 — ABNORMAL LOW

## 2010-12-30 LAB — POCT I-STAT 4, (NA,K, GLUC, HGB,HCT)
Glucose, Bld: 102 — ABNORMAL HIGH
Glucose, Bld: 113 — ABNORMAL HIGH
Glucose, Bld: 116 — ABNORMAL HIGH
Glucose, Bld: 116 — ABNORMAL HIGH
Glucose, Bld: 150 — ABNORMAL HIGH
Glucose, Bld: 154 — ABNORMAL HIGH
Glucose, Bld: 92
HCT: 26 — ABNORMAL LOW
HCT: 26 — ABNORMAL LOW
HCT: 27 — ABNORMAL LOW
HCT: 29 — ABNORMAL LOW
HCT: 32 — ABNORMAL LOW
HCT: 32 — ABNORMAL LOW
HCT: 34 — ABNORMAL LOW
Hemoglobin: 10.9 — ABNORMAL LOW
Hemoglobin: 10.9 — ABNORMAL LOW
Hemoglobin: 11.6 — ABNORMAL LOW
Hemoglobin: 8.8 — ABNORMAL LOW
Hemoglobin: 8.8 — ABNORMAL LOW
Hemoglobin: 9.2 — ABNORMAL LOW
Hemoglobin: 9.9 — ABNORMAL LOW
Operator id: 288291
Operator id: 3402
Operator id: 3402
Operator id: 3402
Operator id: 3402
Operator id: 3402
Operator id: 3402
Potassium: 3.4 — ABNORMAL LOW
Potassium: 3.9
Potassium: 4.2
Potassium: 4.3
Potassium: 4.5
Potassium: 5.1
Potassium: 5.2 — ABNORMAL HIGH
Sodium: 132 — ABNORMAL LOW
Sodium: 133 — ABNORMAL LOW
Sodium: 135
Sodium: 136
Sodium: 139
Sodium: 140
Sodium: 142

## 2010-12-30 LAB — COMPREHENSIVE METABOLIC PANEL
ALT: 20
ALT: 21
AST: 24
AST: 25
Albumin: 3.8
Albumin: 3.9
Alkaline Phosphatase: 42
Alkaline Phosphatase: 47
BUN: 9
BUN: 9
CO2: 22
CO2: 30
Calcium: 8.7
Calcium: 8.9
Chloride: 103
Chloride: 104
Creatinine, Ser: 0.8
Creatinine, Ser: 0.88
GFR calc Af Amer: >60
GFR calc non Af Amer: >60
GFR calc non Af Amer: >60
Glucose, Bld: 100 — ABNORMAL HIGH
Glucose, Bld: 93
Potassium: 4
Potassium: 4.4
Sodium: 133 — ABNORMAL LOW
Sodium: 140
Total Bilirubin: 1.3 — ABNORMAL HIGH
Total Bilirubin: 1.6 — ABNORMAL HIGH
Total Protein: 6.9
Total Protein: 7

## 2010-12-30 LAB — CBC
HCT: 26.3 — ABNORMAL LOW
HCT: 27.2 — ABNORMAL LOW
HCT: 28.3 — ABNORMAL LOW
HCT: 30.6 — ABNORMAL LOW
HCT: 39.6
HCT: 41.4
Hemoglobin: 10.7 — ABNORMAL LOW
Hemoglobin: 13.7
Hemoglobin: 14.3
Hemoglobin: 9.2 — ABNORMAL LOW
Hemoglobin: 9.4 — ABNORMAL LOW
Hemoglobin: 9.8 — ABNORMAL LOW
MCHC: 34.4
MCHC: 34.5
MCHC: 34.6
MCHC: 34.6
MCHC: 35.1
MCHC: 35.2
MCV: 95.3
MCV: 95.7
MCV: 95.9
MCV: 96
MCV: 96.3
MCV: 97.8
Platelets: 120 — ABNORMAL LOW
Platelets: 133 — ABNORMAL LOW
Platelets: 151
Platelets: 162
Platelets: 176
Platelets: 190
RBC: 2.73 — ABNORMAL LOW
RBC: 2.78 — ABNORMAL LOW
RBC: 2.96 — ABNORMAL LOW
RBC: 3.19 — ABNORMAL LOW
RBC: 4.13 — ABNORMAL LOW
RBC: 4.31
RDW: 13.2
RDW: 13.3
RDW: 13.3
RDW: 13.4
RDW: 13.6
RDW: 13.9
WBC: 10.4
WBC: 11.4 — ABNORMAL HIGH
WBC: 13.9 — ABNORMAL HIGH
WBC: 15.4 — ABNORMAL HIGH
WBC: 6.2
WBC: 7.2

## 2010-12-30 LAB — POCT I-STAT 3, ART BLOOD GAS (G3+)
Acid-base deficit: 1
Acid-base deficit: 5 — ABNORMAL HIGH
Bicarbonate: 18.7 — ABNORMAL LOW
Bicarbonate: 23
Bicarbonate: 25.4 — ABNORMAL HIGH
O2 Saturation: 100
O2 Saturation: 100
O2 Saturation: 99
Operator id: 199821
Operator id: 3402
Operator id: 3402
Patient temperature: 37.3
TCO2: 19
TCO2: 24
TCO2: 27
pCO2 arterial: 27.4 — ABNORMAL LOW
pCO2 arterial: 29.7 — ABNORMAL LOW
pCO2 arterial: 52.7 — ABNORMAL HIGH
pH, Arterial: 7.292 — ABNORMAL LOW
pH, Arterial: 7.442
pH, Arterial: 7.497 — ABNORMAL HIGH
pO2, Arterial: 119 — ABNORMAL HIGH
pO2, Arterial: 303 — ABNORMAL HIGH
pO2, Arterial: 384 — ABNORMAL HIGH

## 2010-12-30 LAB — URINALYSIS, ROUTINE W REFLEX MICROSCOPIC
Bilirubin Urine: NEGATIVE
Bilirubin Urine: NEGATIVE
Glucose, UA: NEGATIVE
Glucose, UA: NEGATIVE
Hgb urine dipstick: NEGATIVE
Hgb urine dipstick: NEGATIVE
Ketones, ur: NEGATIVE
Ketones, ur: NEGATIVE
Nitrite: NEGATIVE
Nitrite: NEGATIVE
Protein, ur: NEGATIVE
Protein, ur: NEGATIVE
Specific Gravity, Urine: 1.017
Specific Gravity, Urine: 1.019
Urobilinogen, UA: 0.2
Urobilinogen, UA: 0.2
pH: 6
pH: 7

## 2010-12-30 LAB — I-STAT EC8
Acid-base deficit: 4 — ABNORMAL HIGH
BUN: 11
Bicarbonate: 20.5
Chloride: 108
Glucose, Bld: 167 — ABNORMAL HIGH
HCT: 26 — ABNORMAL LOW
Hemoglobin: 8.8 — ABNORMAL LOW
Operator id: 199821
Potassium: 5.2 — ABNORMAL HIGH
Sodium: 139
TCO2: 22
pCO2 arterial: 33.6 — ABNORMAL LOW
pH, Arterial: 7.394

## 2010-12-30 LAB — BASIC METABOLIC PANEL
BUN: 11
BUN: 14
CO2: 24
CO2: 24
Calcium: 7.1 — ABNORMAL LOW
Calcium: 7.6 — ABNORMAL LOW
Chloride: 108
Chloride: 112
Creatinine, Ser: 0.77
Creatinine, Ser: 0.8
GFR calc Af Amer: >60
GFR calc Af Amer: >60
GFR calc non Af Amer: >60
GFR calc non Af Amer: >60
Glucose, Bld: 118 — ABNORMAL HIGH
Glucose, Bld: 126 — ABNORMAL HIGH
Potassium: 4.3
Potassium: 4.7
Sodium: 140
Sodium: 141

## 2010-12-30 LAB — MAGNESIUM
Magnesium: 2.5
Magnesium: 2.9 — ABNORMAL HIGH

## 2010-12-30 LAB — POCT I-STAT 3, VENOUS BLOOD GAS (G3P V)
Acid-base deficit: 2
Bicarbonate: 22.9
O2 Saturation: 97
Operator id: 288291
Patient temperature: 35.3
TCO2: 24
pCO2, Ven: 35.9 — ABNORMAL LOW
pH, Ven: 7.405 — ABNORMAL HIGH
pO2, Ven: 85 — ABNORMAL HIGH

## 2010-12-30 LAB — CREATININE, SERUM
Creatinine, Ser: 0.8
GFR calc Af Amer: >60
GFR calc non Af Amer: >60

## 2010-12-30 LAB — PREPARE PLATELET PHERESIS

## 2010-12-30 LAB — TYPE AND SCREEN
ABO/RH(D): O POS
ABO/RH(D): O POS
Antibody Screen: NEGATIVE
Antibody Screen: NEGATIVE

## 2010-12-30 LAB — HEMOGLOBIN AND HEMATOCRIT, BLOOD
HCT: 25.6 — ABNORMAL LOW
Hemoglobin: 9.1 — ABNORMAL LOW

## 2010-12-30 LAB — PROTIME-INR
INR: 0.9
INR: 0.9
INR: 1.1
INR: 1.3
INR: 0.9
INR: 1
INR: 1.2
Prothrombin Time: 12.4
Prothrombin Time: 12.4
Prothrombin Time: 14.7
Prothrombin Time: 16.3 — ABNORMAL HIGH
Prothrombin Time: 12.8
Prothrombin Time: 13.4
Prothrombin Time: 15.3 — ABNORMAL HIGH

## 2010-12-30 LAB — BLOOD GAS, ARTERIAL
Acid-Base Excess: 1
Bicarbonate: 24.6 — ABNORMAL HIGH
Drawn by: 274481
FIO2: 0.21
O2 Saturation: 97
Patient temperature: 98.6
TCO2: 25.8
pCO2 arterial: 36.4
pH, Arterial: 7.445
pO2, Arterial: 90.4

## 2010-12-30 LAB — HEMOGLOBIN A1C
Hgb A1c MFr Bld: 5.7
Mean Plasma Glucose: 126
Mean Plasma Glucose: 126

## 2010-12-30 LAB — PLATELET COUNT: Platelets: 90 — ABNORMAL LOW

## 2010-12-30 LAB — ABO/RH: ABO/RH(D): O POS

## 2010-12-30 LAB — APTT
aPTT: 35
aPTT: 36
aPTT: 36

## 2011-01-03 LAB — PROTIME-INR
INR: 3.2 — ABNORMAL HIGH
Prothrombin Time: 33.6 — ABNORMAL HIGH

## 2012-06-28 ENCOUNTER — Ambulatory Visit: Payer: Self-pay | Admitting: Cardiovascular Disease

## 2012-06-28 DIAGNOSIS — Z7901 Long term (current) use of anticoagulants: Secondary | ICD-10-CM

## 2012-06-28 DIAGNOSIS — Z79899 Other long term (current) drug therapy: Secondary | ICD-10-CM | POA: Insufficient documentation

## 2012-06-28 DIAGNOSIS — Z952 Presence of prosthetic heart valve: Secondary | ICD-10-CM | POA: Insufficient documentation

## 2012-09-04 ENCOUNTER — Telehealth: Payer: Self-pay | Admitting: Cardiovascular Disease

## 2012-09-04 NOTE — Telephone Encounter (Signed)
Returned call and spoke w/ pt's wife, Sedalia Muta.  Stated Dr. Salena Saner told pt to take 2.5mg  (1/2 tab) Bystolic.  Stated pt's BP has been 130s/70s.  Wife stated she thinks that is too high and wants to know if pt can go back on the 5mg  daily.  Stated pt stated sometimes he feels dizzy.  Wife informed Dr. Salena Saner will be notified for further instructions.  Verbalized understanding and agreed w/ plan.

## 2012-09-04 NOTE — Telephone Encounter (Signed)
Please call-having problem with blood pressure medicine!

## 2012-09-05 ENCOUNTER — Emergency Department (HOSPITAL_COMMUNITY): Payer: Medicare Other

## 2012-09-05 ENCOUNTER — Emergency Department (HOSPITAL_COMMUNITY)
Admission: EM | Admit: 2012-09-05 | Discharge: 2012-09-05 | Disposition: A | Discharge status: Home / Self Care | Payer: Medicare Other | Attending: Emergency Medicine | Admitting: Emergency Medicine

## 2012-09-05 ENCOUNTER — Encounter (HOSPITAL_COMMUNITY): Payer: Self-pay | Admitting: *Deleted

## 2012-09-05 DIAGNOSIS — I10 Essential (primary) hypertension: Secondary | ICD-10-CM | POA: Insufficient documentation

## 2012-09-05 DIAGNOSIS — I498 Other specified cardiac arrhythmias: Secondary | ICD-10-CM | POA: Insufficient documentation

## 2012-09-05 DIAGNOSIS — Z7982 Long term (current) use of aspirin: Secondary | ICD-10-CM | POA: Insufficient documentation

## 2012-09-05 DIAGNOSIS — Z79899 Other long term (current) drug therapy: Secondary | ICD-10-CM | POA: Insufficient documentation

## 2012-09-05 DIAGNOSIS — Z8679 Personal history of other diseases of the circulatory system: Secondary | ICD-10-CM | POA: Insufficient documentation

## 2012-09-05 DIAGNOSIS — Z7901 Long term (current) use of anticoagulants: Secondary | ICD-10-CM | POA: Insufficient documentation

## 2012-09-05 DIAGNOSIS — R001 Bradycardia, unspecified: Secondary | ICD-10-CM

## 2012-09-05 HISTORY — DX: Unspecified atrial fibrillation: I48.91

## 2012-09-05 HISTORY — DX: Essential (primary) hypertension: I10

## 2012-09-05 LAB — COMPREHENSIVE METABOLIC PANEL
ALT: 25 U/L (ref 0–53)
AST: 30 U/L (ref 0–37)
Albumin: 3.9 g/dL (ref 3.5–5.2)
Alkaline Phosphatase: 51 U/L (ref 39–117)
BUN: 14 mg/dL (ref 6–23)
CO2: 27 mEq/L (ref 19–32)
Calcium: 9.1 mg/dL (ref 8.4–10.5)
Chloride: 103 mEq/L (ref 96–112)
Creatinine, Ser: 0.82 mg/dL (ref 0.50–1.35)
GFR calc Af Amer: >90 mL/min (ref >90)
GFR calc non Af Amer: 89 mL/min — ABNORMAL LOW (ref >90)
Glucose, Bld: 93 mg/dL (ref 70–99)
Potassium: 5 mEq/L (ref 3.5–5.1)
Sodium: 138 mEq/L (ref 135–145)
Total Bilirubin: 1 mg/dL (ref 0.3–1.2)
Total Protein: 6.9 g/dL (ref 6.0–8.3)

## 2012-09-05 LAB — CBC WITH DIFFERENTIAL/PLATELET
Basophils Absolute: 0 10*3/uL (ref 0.0–0.1)
Basophils Relative: 1 % (ref 0–1)
Eosinophils Absolute: 0.1 10*3/uL (ref 0.0–0.7)
Eosinophils Relative: 2 % (ref 0–5)
HCT: 39.5 % (ref 39.0–52.0)
Hemoglobin: 13.6 g/dL (ref 13.0–17.0)
Lymphocytes Relative: 30 % (ref 12–46)
Lymphs Abs: 1.6 10*3/uL (ref 0.7–4.0)
MCH: 32.3 pg (ref 26.0–34.0)
MCHC: 34.4 g/dL (ref 30.0–36.0)
MCV: 93.8 fL (ref 78.0–100.0)
Monocytes Absolute: 0.4 10*3/uL (ref 0.1–1.0)
Monocytes Relative: 9 % (ref 3–12)
Neutro Abs: 3 10*3/uL (ref 1.7–7.7)
Neutrophils Relative %: 59 % (ref 43–77)
Platelets: 124 10*3/uL — ABNORMAL LOW (ref 150–400)
RBC: 4.21 MIL/uL — ABNORMAL LOW (ref 4.22–5.81)
RDW: 13.3 % (ref 11.5–15.5)
WBC: 5.2 10*3/uL (ref 4.0–10.5)

## 2012-09-05 LAB — POCT I-STAT TROPONIN I: Troponin i, poc: 0 ng/mL (ref 0.00–0.08)

## 2012-09-05 LAB — POCT INR: INR: 3

## 2012-09-05 NOTE — ED Notes (Addendum)
Pt reports that he has been dizzy recently with increased dizziness with bending over.  States hx of vertigo-feels the same.  Also reports that he took his BP at home and the machine said he had irregular heart beat.  Hx of A-fib with ablation.  Pt HR is normally between 45-50

## 2012-09-05 NOTE — ED Notes (Signed)
Pt alert and mentating appropriately upon d/c. Pt given d/c teaching and follow up care instructions. Pt verbalizes understanding of d/c teaching and follow up care. NAD noted upon d/c. Pt denies pain. Pt leaving with wife. Pt leaving with d/c teaching. Pt ambulatory upon d/c.

## 2012-09-05 NOTE — ED Provider Notes (Signed)
History     CSN: [id number]  Arrival date & time 09/05/12  1226   First MD Initiated Contact with Patient 09/05/12 1505      Chief Complaint  Patient presents with  . Dizziness  . Irregular Heart Beat    (Consider location/radiation/quality/duration/timing/severity/associated sxs/prior treatment) The history is provided by the patient.   67 year old male came in to the emergency department because when he checked his blood pressure, the machine said that for irregular heartbeat. He has a history of atrial fibrillation which did require cardioversion. He is not aware of any palpitations and denies chest pain, heaviness, tightness, pressure. He denies dyspnea, nausea, diaphoresis. Since last night, he has noted that when he bends forward and then stands up that he had some momentary lightheadedness. He denies any vertigo. He is currently taking nebivolol 2.5 mg twice a day. He had his INR checked this morning and was 3.0.  Past Medical History  Diagnosis Date  . Hypertension   . Atrial fibrillation     ablation    Past Surgical History  Procedure Laterality Date  . Cardiac surgery      History reviewed. No pertinent family history.  History  Substance Use Topics  . Smoking status: Never Smoker   . Smokeless tobacco: Not on file  . Alcohol Use: No      Review of Systems  All other systems reviewed and are negative.    Allergies  Tetanus toxoids and Penicillins  Home Medications   Current Outpatient Rx  Name  Route  Sig  Dispense  Refill  . Ascorbic Acid (VITAMIN C) 1000 MG tablet   Oral   Take 1,000 mg by mouth daily.         Marland Kitchen aspirin EC 81 MG tablet   Oral   Take 81 mg by mouth daily.         Marland Kitchen atorvastatin (LIPITOR) 80 MG tablet   Oral   Take 80 mg by mouth daily.         . Calcium Carb-Cholecalciferol (CALCIUM 1000 + D PO)   Oral   Take 1 tablet by mouth 2 (two) times daily.         . cetirizine (ZYRTEC) 10 MG tablet   Oral   Take 10 mg  by mouth daily.         . Cholecalciferol (VITAMIN D) 2000 UNITS CAPS   Oral   Take 1 capsule by mouth daily.         Marland Kitchen CINNAMON PO   Oral   Take 1 tablet by mouth daily.         . Multiple Vitamins-Minerals (MULTIVITAMIN WITH MINERALS) tablet   Oral   Take 1 tablet by mouth daily.         . nebivolol (BYSTOLIC) 2.5 MG tablet   Oral   Take 2.5 mg by mouth daily.         . Omega-3 Fatty Acids (FISH OIL) 1200 MG CAPS   Oral   Take 1 capsule by mouth 2 (two) times daily.         . ramipril (ALTACE) 10 MG tablet   Oral   Take 10 mg by mouth daily.         Marland Kitchen warfarin (COUMADIN) 10 MG tablet   Oral   Take 10-15 mg by mouth daily. Takes 10 mg on Monday and Friday and 15 mg all other days           BP  138/74  Pulse 42  Temp(Src) 97.9 F (36.6 C) (Oral)  Resp 13  Ht 5\' 11"  (1.803 m)  Wt 180 lb (81.647 kg)  BMI 25.12 kg/m2  SpO2 97%  Physical Exam  Nursing note and vitals reviewed.  67 year old male, resting comfortably and in no acute distress. Vital signs are significant for bradycardia with a pulse of 42. Oxygen saturation is 97%, which is normal. Head is normocephalic and atraumatic. PERRLA, EOMI. Oropharynx is clear. Neck is nontender and supple without adenopathy or JVD. Back is nontender and there is no CVA tenderness. Lungs are clear without rales, wheezes, or rhonchi. Chest is nontender. Heart has regular rate and rhythm without murmur. Abdomen is soft, flat, nontender without masses or hepatosplenomegaly and peristalsis is normoactive. Extremities have no cyanosis or edema, full range of motion is present. Skin is warm and dry without rash. Neurologic: Mental status is normal, cranial nerves are intact, there are no motor or sensory deficits. Dizziness is not reproduced by head movement. There is no nystagmus.  ED Course  Procedures (including critical care time)  Results for orders placed during the hospital encounter of 09/05/12  CBC WITH  DIFFERENTIAL      Result Value Range   WBC 5.2  4.0 - 10.5 K/uL   RBC 4.21 (*) 4.22 - 5.81 MIL/uL   Hemoglobin 13.6  13.0 - 17.0 g/dL   HCT 14.7  82.9 - 56.2 %   MCV 93.8  78.0 - 100.0 fL   MCH 32.3  26.0 - 34.0 pg   MCHC 34.4  30.0 - 36.0 g/dL   RDW 13.0  86.5 - 78.4 %   Platelets 124 (*) 150 - 400 K/uL   Neutrophils Relative % 59  43 - 77 %   Neutro Abs 3.0  1.7 - 7.7 K/uL   Lymphocytes Relative 30  12 - 46 %   Lymphs Abs 1.6  0.7 - 4.0 K/uL   Monocytes Relative 9  3 - 12 %   Monocytes Absolute 0.4  0.1 - 1.0 K/uL   Eosinophils Relative 2  0 - 5 %   Eosinophils Absolute 0.1  0.0 - 0.7 K/uL   Basophils Relative 1  0 - 1 %   Basophils Absolute 0.0  0.0 - 0.1 K/uL  COMPREHENSIVE METABOLIC PANEL      Result Value Range   Sodium 138  135 - 145 mEq/L   Potassium 5.0  3.5 - 5.1 mEq/L   Chloride 103  96 - 112 mEq/L   CO2 27  19 - 32 mEq/L   Glucose, Bld 93  70 - 99 mg/dL   BUN 14  6 - 23 mg/dL   Creatinine, Ser 6.96  0.50 - 1.35 mg/dL   Calcium 9.1  8.4 - 29.5 mg/dL   Total Protein 6.9  6.0 - 8.3 g/dL   Albumin 3.9  3.5 - 5.2 g/dL   AST 30  0 - 37 U/L   ALT 25  0 - 53 U/L   Alkaline Phosphatase 51  39 - 117 U/L   Total Bilirubin 1.0  0.3 - 1.2 mg/dL   GFR calc non Af Amer 89 (*) >90 mL/min   GFR calc Af Amer >90  >90 mL/min  POCT I-STAT TROPONIN I      Result Value Range   Troponin i, poc 0.00  0.00 - 0.08 ng/mL   Comment 3            Dg Chest 2 View  09/05/2012   *RADIOLOGY REPORT*  Clinical Data: Dizziness, irregular heartbeat  CHEST - 2 VIEW  Comparison: 06/11/2007  Findings: Postsurgical changes are again identified and stable. The lungs are clear bilaterally.  The cardiac shadow is stable.  No acute bony abnormality is seen.  IMPRESSION: No acute abnormality noted.   Original Report Authenticated By: Alcide Clever, M.D.     Date: 09/05/2012  Rate: 44  Rhythm: sinus bradycardia  QRS Axis: normal  Intervals: normal  ST/T Wave abnormalities: normal  Conduction  Disutrbances:right bundle branch block  Narrative Interpretation: Sinus bradycardia, right bundle branch block. When compared with ECG of 08/02/2007, rate is slower but no other significant changes are seen.  Old EKG Reviewed: unchanged    1. Sinus bradycardia       MDM  Cardiac monitor your continues to show sinus bradycardia. He does relate that is beta blocker dose was decreased in February because of bradycardia. He seems to be tolerating the slow heart rate well. Orthostatic vital signs will be checked and if negative, he will be discharged with instructions to followup with his cardiologist. Old records are reviewed and he did require cardioversion for atrial fibrillation in 2009.  Orthostatic vital signs showed no significant change in pulse or blood pressure. Although he does have a slow heart beat, he does not appear to be symptomatic. He'll be referred back to his cardiologist to discuss whether to accept the slow heart rate or whether he needs to have his medications adjusted.      Dione Booze, MD 09/05/12 1745

## 2012-09-05 NOTE — ED Notes (Signed)
The patient was placed on the monitor and did not complain of dizziness anymore.  His heart rate did drop to 41bpm, however, he is asymptomatic.  He denies pain, SOB, or any other complains.  The patient said he called his cardiologist, Dr. Johney Frame, with [facility] and they were not taking calls then.  He was told to come to the ED.  He has had valve replacement surgery and has been shocked into regular rhythm from Afib.  The patient did say he is on coumadin.  No other complaints.

## 2012-09-05 NOTE — ED Notes (Signed)
ekg given to dr. Hyacinth Meeker.  Patient has no old ekg in chart.  ekg was done in triage and dr. Fonnie Jarvis signed off on it.

## 2012-09-06 ENCOUNTER — Telehealth: Payer: Self-pay | Admitting: Cardiovascular Disease

## 2012-09-06 ENCOUNTER — Ambulatory Visit (INDEPENDENT_AMBULATORY_CARE_PROVIDER_SITE_OTHER): Payer: Self-pay | Admitting: Pharmacist Clinician (PhC)/ Clinical Pharmacy Specialist

## 2012-09-06 DIAGNOSIS — Z952 Presence of prosthetic heart valve: Secondary | ICD-10-CM

## 2012-09-06 DIAGNOSIS — Z7901 Long term (current) use of anticoagulants: Secondary | ICD-10-CM

## 2012-09-06 DIAGNOSIS — Z954 Presence of other heart-valve replacement: Secondary | ICD-10-CM

## 2012-09-06 NOTE — Telephone Encounter (Signed)
Marcus [name] is wanting to know what to do because his pulse rate keeps dropping . Went to the ER on yesterday and was told that they needed to speak to the doctor. Please call.  Thanks

## 2012-09-06 NOTE — Telephone Encounter (Signed)
Returned call and spoke w/ pt.  Pt stated he was seen in ER yesterday for low HR in 40s and felt light-headed.  Stated the doctor ran tests and did not make any changes to his meds.  Stated he was told the doctor would send Dr. Salena Saner all of the information and he needed to see Dr. Salena Saner to adjust his meds.  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  Pt scheduled appt to see Dr. Salena Saner on Monday, June 2nd at 8:15am.  Offered to work in today and pt declined.  ER instructions given.  Pt verbalized understanding and agreed w/ plan.  FYI Message forwarded to Dr. Royann Shivers.

## 2012-09-07 NOTE — Telephone Encounter (Signed)
BP is not too high. HR may be too low

## 2012-09-07 NOTE — Telephone Encounter (Signed)
Stop bystolic. Show me his paper chart Monday please

## 2012-09-09 ENCOUNTER — Encounter: Payer: Self-pay | Admitting: Cardiovascular Disease

## 2012-09-09 ENCOUNTER — Ambulatory Visit (INDEPENDENT_AMBULATORY_CARE_PROVIDER_SITE_OTHER): Payer: Medicare Other | Admitting: Cardiovascular Disease

## 2012-09-09 VITALS — BP 110/78 | HR 43 | Resp 16 | Ht 72.0 in | Wt 186.4 lb

## 2012-09-09 DIAGNOSIS — E785 Hyperlipidemia, unspecified: Secondary | ICD-10-CM

## 2012-09-09 DIAGNOSIS — Z9889 Other specified postprocedural states: Secondary | ICD-10-CM

## 2012-09-09 DIAGNOSIS — I9789 Other postprocedural complications and disorders of the circulatory system, not elsewhere classified: Secondary | ICD-10-CM

## 2012-09-09 DIAGNOSIS — I4891 Unspecified atrial fibrillation: Secondary | ICD-10-CM

## 2012-09-09 DIAGNOSIS — Z951 Presence of aortocoronary bypass graft: Secondary | ICD-10-CM

## 2012-09-09 DIAGNOSIS — E7800 Pure hypercholesterolemia, unspecified: Secondary | ICD-10-CM | POA: Insufficient documentation

## 2012-09-09 DIAGNOSIS — Z954 Presence of other heart-valve replacement: Secondary | ICD-10-CM

## 2012-09-09 DIAGNOSIS — I10 Essential (primary) hypertension: Secondary | ICD-10-CM

## 2012-09-09 DIAGNOSIS — Z952 Presence of prosthetic heart valve: Secondary | ICD-10-CM

## 2012-09-09 DIAGNOSIS — Z8679 Personal history of other diseases of the circulatory system: Secondary | ICD-10-CM

## 2012-09-09 DIAGNOSIS — I495 Sick sinus syndrome: Secondary | ICD-10-CM

## 2012-09-09 DIAGNOSIS — I2510 Atherosclerotic heart disease of native coronary artery without angina pectoris: Secondary | ICD-10-CM

## 2012-09-09 DIAGNOSIS — R001 Bradycardia, unspecified: Secondary | ICD-10-CM

## 2012-09-09 MED ORDER — AMLODIPINE BESYLATE 5 MG PO TABS: 5.0000 mg | ORAL_TABLET | Freq: Every day | ORAL | Status: DC

## 2012-09-09 NOTE — Assessment & Plan Note (Signed)
Lipid parameters at target

## 2012-09-09 NOTE — Assessment & Plan Note (Addendum)
Stopped Bystolic. Start amlodipine 5 mg daily. Followup with physician assistant/nurse practitioner in 6 weeks for further adjustment of antihypertensive medication.

## 2012-09-09 NOTE — Patient Instructions (Signed)
Your physician has recommended you make the following change in your medication: stop Bystolic. Start Amlodipine 5 mg daily.

## 2012-09-09 NOTE — Assessment & Plan Note (Signed)
Asymptomatic, free of angina despite active lifestyle. One wonders whether angina could be masked by his marked bradycardia.

## 2012-09-09 NOTE — Assessment & Plan Note (Signed)
He appears to have symptomatic sinus bradycardia now. His heart rate has been slow for years but recently has become symptomatic. I have asked him to stop the bystolic altogether. We will use other antihypertensive to compensate for its discontinuation. Ideally he would be on a beta blocker because of his history of aortic aneurysm repair, but I do not think he can safely continue this medication without implantation of a pacemaker. At the same time if he becomes asymptomatic after discontinuation of the bystolic, I think we can safely defer pacemaker implantation

## 2012-09-09 NOTE — Assessment & Plan Note (Signed)
We will recheck an echocardiogram to make sure that his symptoms are not due to worsening prosthetic valve performance.

## 2012-09-09 NOTE — Assessment & Plan Note (Signed)
Bentall with mechanical AVR St Jude 25 mm, right and left coronary artery reimplantation, SVG to distal RCA, SVG to OM, Dr. Rexanne Mano, February 2009

## 2012-09-09 NOTE — Progress Notes (Signed)
Patient ID: Marcus [name], male   DOB: June 29, 1945, 67 y.o.   MRN: [id number]    Reason for office visit Follow up after ED visit  Marcus [name] is a 67 year old gentleman who is now roughly 5 years status post repair of an ascending aortic aneurysm and aortic valve replacement with a mechanical prosthesis (St. Jude 25 mm), with reimplantation of the right and left coronary arteries and to saphenous vein graft bypasses (SVG to OM and SVG to distal RCA). Additional comorbidities include dyslipidemia systemic hypertension and obstructive sleep apnea.  He was seen in the emergency room with complaints of dizziness. As always she was quite bradycardic but did not have any evidence of arrhythmia. His blood pressure was normal. Laboratory tests were normal. An electrocardiogram showed sinus bradycardia which is a chronic abnormality.  He does complain of fatigue but remains very active. He continues to walk as much as 4 miles a day and at the end of his walk his heart rate is 58 beats per minute. He has not experienced syncope. He is compliant with his CPAP for his obstructive sleep apnea. He has not experienced chest pain shortness of breath focal neurological events bleeding complications. 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  We instructed him by phone to discontinue his bystolic but he was leery of doing this because he was worried of excessive rise in his blood pressure.  Allergies  Allergen Reactions  . Tetanus Toxoids Anaphylaxis  . Crestor (Rosuvastatin)   . Penicillins     Hives     Current Outpatient Prescriptions  Medication Sig Dispense Refill  . Ascorbic Acid (VITAMIN C) 1000 MG tablet Take 1,000 mg by mouth daily.      Marland Kitchen aspirin EC 81 MG tablet Take 81 mg by mouth daily.      Marland Kitchen atorvastatin (LIPITOR) 80 MG tablet Take 80 mg by mouth daily.      . Calcium Carb-Cholecalciferol (CALCIUM 1000 + D PO)  Take 1 tablet by mouth 2 (two) times daily.      . cetirizine (ZYRTEC) 10 MG tablet Take 10 mg by mouth daily.      . Cholecalciferol (VITAMIN D) 2000 UNITS CAPS Take 1 capsule by mouth daily.      Marland Kitchen CINNAMON PO Take 1 tablet by mouth daily.      . fluticasone (VERAMYST) 27.5 MCG/SPRAY nasal spray Place 2 sprays into the nose daily.      . Multiple Vitamins-Minerals (MULTIVITAMIN WITH MINERALS) tablet Take 1 tablet by mouth daily.      . Omega-3 Fatty Acids (FISH OIL) 1200 MG CAPS Take 1 capsule by mouth 2 (two) times daily.      . ramipril (ALTACE) 10 MG tablet Take 10 mg by mouth daily.      Marland Kitchen warfarin (COUMADIN) 10 MG tablet Take 10-15 mg by mouth daily. Takes 10 mg on Monday and Friday and 15 mg all other days      . amLODipine (NORVASC) 5 MG tablet Take 1 tablet (5 mg total) by mouth daily.  180 tablet  3   No current facility-administered medications for this visit.    Past Medical History  Diagnosis Date  . Hypertension   . Atrial fibrillation     ablation  . Obstructive sleep apnea 10/25/2007    AHI-0.71/hr  . Swelling of limb     LEA VENOUS, 08/22/2009 - no evidence  of deep vein or superficial thrombosis; partially rupturing Baker's Cyst  . S/P AVR     2D ECHO, 04/25/2011 - EF >55%, Right ventricle-mild-moderately dilated    Past Surgical History  Procedure Laterality Date  . Cardiac surgery    . Cardioversion  08/02/2007    150 Joule biphasic shock with restoration of sinus rhythm. Heart rate 60.  . Cardiac catheterization Bilateral 05/10/2007    Significant 1-vessel disease, severely dilated aortic root with moderate severe aortic insufficiency  . Nm myoview ltd  04/08/2007    No evidence of inducible myocardial ischemia    No family history on file.  History   Social History  . Marital Status: Married    Spouse Name: N/A    Number of Children: N/A  . Years of Education: N/A   Occupational History  . Not on file.   Social History Main Topics  . Smoking  status: Never Smoker   . Smokeless tobacco: Not on file  . Alcohol Use: No  . Drug Use: No  . Sexually Active: Not on file   Other Topics Concern  . Not on file   Social History Narrative  . No narrative on file    Review of systems: The patient specifically denies any chest pain at rest or with exertion, dyspnea at rest or with exertion, orthopnea, paroxysmal nocturnal dyspnea, syncope, palpitations, focal neurological deficits, intermittent claudication, lower extremity edema, unexplained weight gain, cough, hemoptysis or wheezing, gastrointestinal bleeding or other bleeding problems, abdominal pain, hematuria, dysuria, allergic reactions, mood problems, sleep abnormalities, polyuria or polydipsia, intolerance to heat or cold, change in bowel pattern or mood disorders.   PHYSICAL EXAM BP 110/78  Pulse 43  Resp 16  Ht 6' (1.829 m)  Wt 186 lb 6.4 oz (84.55 kg)  BMI 25.27 kg/m2  General: Alert, oriented x3, no distress Head: no evidence of trauma, PERRL, EOMI, no exophtalmos or lid lag, no myxedema, no xanthelasma; normal ears, nose and oropharynx Neck: normal jugular venous pulsations and no hepatojugular reflux; brisk carotid pulses without delay and no carotid bruits Chest: clear to auscultation, no signs of consolidation by percussion or palpation, normal fremitus, symmetrical and full respiratory excursions Cardiovascular: normal position and quality of the apical impulse, regular rhythm, normal first and second heart sounds, no rubs or gallops. Rate crisp prosthetic valve clicks. There is a grade 1/6 systolic ejection murmur in the aortic focus. There are no diastolic murmurs Abdomen: no tenderness or distention, no masses by palpation, no abnormal pulsatility or arterial bruits, normal bowel sounds, no hepatosplenomegaly Extremities: no clubbing, cyanosis or edema; 2+ radial, ulnar and brachial pulses bilaterally; 2+ right femoral, posterior tibial and dorsalis pedis pulses; 2+  left femoral, posterior tibial and dorsalis pedis pulses; no subclavian or femoral bruits Neurological: grossly nonfocal   EKG: Marked sinus bradycardia, chronic right bundle branch block   Lipid Panel  In January 2014 total cholesterol 156, triglycerides 65, HDL 58, LDL 85  BMET    Component Value Date/Time   NA 138 09/05/2012 1246   K 5.0 09/05/2012 1246   CL 103 09/05/2012 1246   CO2 27 09/05/2012 1246   GLUCOSE 93 09/05/2012 1246   BUN 14 09/05/2012 1246   CREATININE 0.82 09/05/2012 1246   CALCIUM 9.1 09/05/2012 1246   GFRNONAA 89* 09/05/2012 1246   GFRAA >90 09/05/2012 1246     ASSESSMENT AND PLAN Bradycardia, severe sinus He appears to have symptomatic sinus bradycardia now. His heart rate has been slow for  years but recently has become symptomatic. I have asked him to stop the bystolic altogether. We will use other antihypertensive to compensate for its discontinuation. Ideally he would be on a beta blocker because of his history of aortic aneurysm repair, but I do not think he can safely continue this medication without implantation of a pacemaker. At the same time if he becomes asymptomatic after discontinuation of the bystolic, I think we can safely defer pacemaker implantation  H/O aortic valve replacement We will recheck an echocardiogram to make sure that his symptoms are not due to worsening prosthetic valve performance.  CAD (coronary artery disease) Asymptomatic, free of angina despite active lifestyle. One wonders whether angina could be masked by his marked bradycardia.  S/P thoracic aortic aneurysm repair Bentall with mechanical AVR St Jude 25 mm, right and left coronary artery reimplantation, SVG to distal RCA, SVG to OM, Dr. Rexanne Mano, February 2009   Hyperlipidemia Lipid parameters at target  HTN, goal below 130/80 Stopped by systolic. Start amlodipine 5 mg daily. Followup with physician assistant/nurse practitioner in 6 weeks for further adjustment of  antihypertensive medication.     Junious Silk, MD, [facility] [phone] office [phone] pager

## 2012-09-20 NOTE — Telephone Encounter (Signed)
LMOM to call if still having problems w/BP or meds

## 2012-09-21 ENCOUNTER — Encounter: Payer: Self-pay | Admitting: Cardiovascular Disease

## 2012-10-07 LAB — POCT INR: INR: 3

## 2012-10-08 ENCOUNTER — Ambulatory Visit (INDEPENDENT_AMBULATORY_CARE_PROVIDER_SITE_OTHER): Payer: Self-pay | Admitting: Pharmacist Clinician (PhC)/ Clinical Pharmacy Specialist

## 2012-10-08 DIAGNOSIS — Z7901 Long term (current) use of anticoagulants: Secondary | ICD-10-CM

## 2012-10-08 DIAGNOSIS — Z954 Presence of other heart-valve replacement: Secondary | ICD-10-CM

## 2012-10-08 DIAGNOSIS — Z952 Presence of prosthetic heart valve: Secondary | ICD-10-CM

## 2012-10-14 ENCOUNTER — Other Ambulatory Visit: Payer: Self-pay | Admitting: Cardiovascular Disease

## 2012-10-14 NOTE — Telephone Encounter (Signed)
Rx was sent to pharmacy electronically. 

## 2012-10-21 ENCOUNTER — Other Ambulatory Visit (HOSPITAL_COMMUNITY): Payer: Self-pay | Admitting: Cardiovascular Disease

## 2012-10-21 ENCOUNTER — Ambulatory Visit: Payer: Medicare Other | Admitting: Pharmacist Clinician (PhC)/ Clinical Pharmacy Specialist

## 2012-10-21 ENCOUNTER — Telehealth: Payer: Self-pay | Admitting: *Deleted

## 2012-10-21 ENCOUNTER — Ambulatory Visit (HOSPITAL_COMMUNITY)
Admission: RE | Admit: 2012-10-21 | Discharge: 2012-10-21 | Disposition: A | Discharge status: Home / Self Care | Payer: Medicare Other | Source: Ambulatory Visit | Attending: Cardiovascular Disease | Admitting: Cardiovascular Disease

## 2012-10-21 DIAGNOSIS — I2510 Atherosclerotic heart disease of native coronary artery without angina pectoris: Secondary | ICD-10-CM | POA: Insufficient documentation

## 2012-10-21 DIAGNOSIS — I379 Nonrheumatic pulmonary valve disorder, unspecified: Secondary | ICD-10-CM | POA: Insufficient documentation

## 2012-10-21 DIAGNOSIS — I10 Essential (primary) hypertension: Secondary | ICD-10-CM | POA: Insufficient documentation

## 2012-10-21 DIAGNOSIS — I359 Nonrheumatic aortic valve disorder, unspecified: Secondary | ICD-10-CM

## 2012-10-21 DIAGNOSIS — I25810 Atherosclerosis of coronary artery bypass graft(s) without angina pectoris: Secondary | ICD-10-CM

## 2012-10-21 DIAGNOSIS — I079 Rheumatic tricuspid valve disease, unspecified: Secondary | ICD-10-CM | POA: Insufficient documentation

## 2012-10-21 DIAGNOSIS — I059 Rheumatic mitral valve disease, unspecified: Secondary | ICD-10-CM | POA: Insufficient documentation

## 2012-10-21 DIAGNOSIS — Z954 Presence of other heart-valve replacement: Secondary | ICD-10-CM | POA: Insufficient documentation

## 2012-10-21 NOTE — Telephone Encounter (Signed)
Left message on answer machine  Echo looks good any question call office.

## 2012-10-21 NOTE — Telephone Encounter (Signed)
Message copied by Tobin Chad on Mon Oct 21, 2012  6:17 PM ------      Message from: Thurmon Fair      Created: Mon Oct 21, 2012  5:37 PM       Normal prosthetic valve function and LV function. ------

## 2012-10-21 NOTE — Progress Notes (Signed)
2D Echo Performed 10/21/2012    Olivianna Higley, RCS  

## 2012-10-28 ENCOUNTER — Ambulatory Visit (INDEPENDENT_AMBULATORY_CARE_PROVIDER_SITE_OTHER): Payer: Self-pay | Admitting: Pharmacist Clinician (PhC)/ Clinical Pharmacy Specialist

## 2012-10-28 DIAGNOSIS — Z7901 Long term (current) use of anticoagulants: Secondary | ICD-10-CM

## 2012-10-28 DIAGNOSIS — Z954 Presence of other heart-valve replacement: Secondary | ICD-10-CM

## 2012-10-28 DIAGNOSIS — Z952 Presence of prosthetic heart valve: Secondary | ICD-10-CM

## 2012-10-28 LAB — POCT INR: INR: 3.1

## 2012-11-01 ENCOUNTER — Ambulatory Visit (INDEPENDENT_AMBULATORY_CARE_PROVIDER_SITE_OTHER): Payer: Medicare Other | Admitting: Cardiovascular Disease

## 2012-11-01 ENCOUNTER — Encounter: Payer: Self-pay | Admitting: Cardiovascular Disease

## 2012-11-01 VITALS — BP 128/62 | HR 56 | Ht 70.0 in | Wt 184.5 lb

## 2012-11-01 DIAGNOSIS — Z952 Presence of prosthetic heart valve: Secondary | ICD-10-CM

## 2012-11-01 DIAGNOSIS — I2510 Atherosclerotic heart disease of native coronary artery without angina pectoris: Secondary | ICD-10-CM

## 2012-11-01 DIAGNOSIS — G4730 Sleep apnea, unspecified: Secondary | ICD-10-CM | POA: Insufficient documentation

## 2012-11-01 DIAGNOSIS — Z9889 Other specified postprocedural states: Secondary | ICD-10-CM

## 2012-11-01 DIAGNOSIS — Z954 Presence of other heart-valve replacement: Secondary | ICD-10-CM

## 2012-11-01 DIAGNOSIS — Z951 Presence of aortocoronary bypass graft: Secondary | ICD-10-CM

## 2012-11-01 DIAGNOSIS — G4733 Obstructive sleep apnea (adult) (pediatric): Secondary | ICD-10-CM

## 2012-11-01 DIAGNOSIS — Z8679 Personal history of other diseases of the circulatory system: Secondary | ICD-10-CM

## 2012-11-01 NOTE — Progress Notes (Signed)
Patient ID: Marcus [name], male   DOB: [birth year], 67 y.o.   MRN: [id number]     HPI: Marcus [name], is a 67 y.o. male presents for one-year evaluation of his complex sleep apnea.  Marcus [name] is a 67 year old male who is status post into mechanical aortic valve prosthesis and proximal aortic stent with reimplantation of his right and left coronary arteries and 3 2009. As documented complex sleep apnea and has required BiPAP although several ventilation. He uses a [employer] mask he has felt significantly improved since he has been using that several ventilation. I had seen him one year ago at which time his AHI was 0.1, his average device IPAP pressure greater than 90% of the time was 16.3 and average CPAP pressure of 13.3.  Over the past year, and he admits to 100% compliance with this therapy the he typically goes to bed at 10:30 PM and wakes up at 6:30 AM. Apparently he is in need for new [employer]. He had been using a small company in [place] and would like to switch so that there will be less out-of-pocket cost based on insurance. He presently denies any residual daytime sleepiness. He is unaware of breakthrough snoring. He denies nocturnal tachycardia palpitations.  Past Medical History  Diagnosis Date  . Hypertension   . Atrial fibrillation     ablation  . Obstructive sleep apnea 10/25/2007    AHI-0.71/hr  . Swelling of limb     LEA VENOUS, 08/22/2009 - no evidence of deep vein or superficial thrombosis; partially rupturing Baker's Cyst  . S/P AVR     2D ECHO, 04/25/2011 - EF >55%, Right ventricle-mild-moderately dilated    Past Surgical History  Procedure Laterality Date  . Cardiac surgery    . Cardioversion  08/02/2007    150 Joule biphasic shock with restoration of sinus rhythm. Heart rate 60.  . Cardiac catheterization Bilateral 05/10/2007    Significant 1-vessel disease, severely dilated aortic root with moderate severe aortic insufficiency  . Nm myoview ltd   04/08/2007    No evidence of inducible myocardial ischemia    Allergies  Allergen Reactions  . Tetanus Toxoids Anaphylaxis  . Crestor (Rosuvastatin)   . Penicillins     Hives     Current Outpatient Prescriptions  Medication Sig Dispense Refill  . amLODipine (NORVASC) 5 MG tablet Take 1 tablet (5 mg total) by mouth daily.  180 tablet  3  . Ascorbic Acid (VITAMIN C) 1000 MG tablet Take 1,000 mg by mouth daily.      Marland Kitchen aspirin EC 81 MG tablet Take 81 mg by mouth daily.      Marland Kitchen atorvastatin (LIPITOR) 80 MG tablet Take 80 mg by mouth daily.      . Calcium Carb-Cholecalciferol (CALCIUM 1000 + D PO) Take 2,400 mg by mouth daily.       . cetirizine (ZYRTEC) 10 MG tablet Take 10 mg by mouth daily.      . Cholecalciferol (VITAMIN D) 2000 UNITS CAPS Take 1 capsule by mouth daily.      Marland Kitchen CINNAMON PO Take 1 tablet by mouth daily.      . Multiple Vitamins-Minerals (MULTIVITAMIN WITH MINERALS) tablet Take 1 tablet by mouth daily.      . Omega-3 Fatty Acids (FISH OIL) 1200 MG CAPS Take 1 capsule by mouth 2 (two) times daily.      . ramipril (ALTACE) 10 MG capsule TAKE ONE CAPSULE BY MOUTH EVERY DAY  90  capsule  3  . warfarin (COUMADIN) 10 MG tablet Take 10-15 mg by mouth daily. Takes 10 mg on Monday and Friday and 15 mg all other days      . fluticasone (VERAMYST) 27.5 MCG/SPRAY nasal spray Place 2 sprays into the nose daily.       No current facility-administered medications for this visit.    Socially he's married has 2 children 3 grandchildren.  He does exercise. No tobacco or alcohol use  ROS is negative for fevers, chills or night sweats. He denies any chest pain. He denies any breakthrough palpitations the he denies daytime sleepiness. He denies breakthrough snoring. There are no restless legs. Is no bruxism. He denies cachectic hallucinations. There is no PND or orthopnea.   Other system review is negative.  PE BP 128/62  Pulse 56  Ht 5\' 10"  (1.778 m)  Wt 184 lb 8 oz (83.689 kg)  BMI  26.47 kg/m2  General: Alert, oriented, no distress.  Skin: normal turgor, no rashes HEENT: Normocephalic, atraumatic. Pupils round and reactive; sclera anicteric;no lid lag.  Nose without nasal septal hypertrophy Mouth/Parynx benign; Mallinpatti scale 2 Neck: No JVD, no carotid briuts Lungs: clear to ausculatation and percussion; no wheezing or rales Heart: RRR, s1 s2 normal 2/6 systolic murmur with crisp mechanical St. Jude prosthetic valve clicks the  Abdomen: soft, nontender; no hepatosplenomehaly, BS+; abdominal aorta nontender and not dilated by palpation. Pulses 2+ Extremities: no clubbing cyanosis or edema, Homan's sign negative  Neurologic: grossly nonfocal  ECG: Sinus rhythm with a short PR. Right bundle-branch block with repolarization changes.  I did interrogate Marcus [name] unit  which he brought to the office today.  His 30 day sleep average is  7 hours and 18 minutes. His greater than 4 hour use is 100% of days. 90% IPAP pressure 16.2 and EPAP pressure of 12.7. AHI continues to be excellent at 0.1. Flexor set at 3. No leaks.  LABS:  BMET    Component Value Date/Time   NA 138 09/05/2012 1246   K 5.0 09/05/2012 1246   CL 103 09/05/2012 1246   CO2 27 09/05/2012 1246   GLUCOSE 93 09/05/2012 1246   BUN 14 09/05/2012 1246   CREATININE 0.82 09/05/2012 1246   CALCIUM 9.1 09/05/2012 1246   GFRNONAA 89* 09/05/2012 1246   GFRAA >90 09/05/2012 1246     Hepatic Function Panel     Component Value Date/Time   PROT 6.9 09/05/2012 1246   ALBUMIN 3.9 09/05/2012 1246   AST 30 09/05/2012 1246   ALT 25 09/05/2012 1246   ALKPHOS 51 09/05/2012 1246   BILITOT 1.0 09/05/2012 1246     CBC    Component Value Date/Time   WBC 5.2 09/05/2012 1246   RBC 4.21* 09/05/2012 1246   HGB 13.6 09/05/2012 1246   HCT 39.5 09/05/2012 1246   PLT 124* 09/05/2012 1246   MCV 93.8 09/05/2012 1246   MCH 32.3 09/05/2012 1246   MCHC 34.4 09/05/2012 1246   RDW 13.3 09/05/2012 1246   LYMPHSABS 1.6 09/05/2012  1246   MONOABS 0.4 09/05/2012 1246   EOSABS 0.1 09/05/2012 1246   BASOSABS 0.0 09/05/2012 1246     BNP No results found for this basename: probnp    Lipid Panel  No results found for this basename: chol, trig, hdl, cholhdl, vldl, ldlcalc     RADIOLOGY: No results found.    ASSESSMENT AND PLAN: Mr. Koppelman continues to do very well  now 5 years status  post his Bentall procedure in 2009 with a 25 mm St[employer] aortic prosthesis, and aortic root replacement with reimplantation of his right and left coronary arteries as well as saphenous vein bypass to the oblique marginal branch of the left circumflex coronary artery and graft to the distal right coronary artery. His blood pressure is well controlled. From a sleep perspective, he continues to meet Medicare compliant standards. He's deriving significant benefit with his BiPAP AutoSV unit. AHI is excellent at 0.1. Did suggest [employer] as an device company. Remotely, he had used advanced home [facility]. He will call our office to speak with Burna Mortimer and referral will be sent to them initiate obtaining of CPAP supplies  if his insurance authorizes use. Per Medicare requirements I will see him in one year for followup evaluation.     Lennette Bihari, MD, [facility]  11/01/2012 6:37 PM

## 2012-11-01 NOTE — Patient Instructions (Addendum)
Your physician recommends that you schedule a follow-up appointment in: 1 year  

## 2012-11-06 ENCOUNTER — Encounter: Payer: Self-pay | Admitting: Cardiovascular Disease

## 2012-12-01 LAB — POCT INR: INR: 2.5

## 2012-12-03 ENCOUNTER — Ambulatory Visit (INDEPENDENT_AMBULATORY_CARE_PROVIDER_SITE_OTHER): Payer: Self-pay | Admitting: Pharmacist Clinician (PhC)/ Clinical Pharmacy Specialist

## 2012-12-03 DIAGNOSIS — Z7901 Long term (current) use of anticoagulants: Secondary | ICD-10-CM

## 2012-12-03 DIAGNOSIS — Z952 Presence of prosthetic heart valve: Secondary | ICD-10-CM

## 2012-12-03 DIAGNOSIS — Z954 Presence of other heart-valve replacement: Secondary | ICD-10-CM

## 2012-12-17 ENCOUNTER — Ambulatory Visit (INDEPENDENT_AMBULATORY_CARE_PROVIDER_SITE_OTHER): Payer: Medicare Other | Admitting: Cardiovascular Disease

## 2012-12-17 ENCOUNTER — Encounter: Payer: Self-pay | Admitting: Cardiovascular Disease

## 2012-12-17 VITALS — BP 136/84 | HR 64 | Resp 20 | Ht 70.0 in | Wt 188.8 lb

## 2012-12-17 DIAGNOSIS — I4891 Unspecified atrial fibrillation: Secondary | ICD-10-CM

## 2012-12-17 DIAGNOSIS — Z952 Presence of prosthetic heart valve: Secondary | ICD-10-CM

## 2012-12-17 DIAGNOSIS — Z9889 Other specified postprocedural states: Secondary | ICD-10-CM

## 2012-12-17 DIAGNOSIS — Z954 Presence of other heart-valve replacement: Secondary | ICD-10-CM

## 2012-12-17 DIAGNOSIS — Z8679 Personal history of other diseases of the circulatory system: Secondary | ICD-10-CM

## 2012-12-17 DIAGNOSIS — G4733 Obstructive sleep apnea (adult) (pediatric): Secondary | ICD-10-CM

## 2012-12-17 DIAGNOSIS — I9789 Other postprocedural complications and disorders of the circulatory system, not elsewhere classified: Secondary | ICD-10-CM

## 2012-12-17 DIAGNOSIS — I10 Essential (primary) hypertension: Secondary | ICD-10-CM

## 2012-12-17 DIAGNOSIS — I2510 Atherosclerotic heart disease of native coronary artery without angina pectoris: Secondary | ICD-10-CM

## 2012-12-17 NOTE — Patient Instructions (Addendum)
Your physician has recommended you make the following change in your medication: Stop amlodipine  These record her blood pressure once a day while at rest for at least 10 minutes, sitting down. Please send [place] a list of those blood pressures.  Your physician recommends that you schedule a follow-up appointment in: 3 months

## 2012-12-22 ENCOUNTER — Encounter: Payer: Self-pay | Admitting: Cardiovascular Disease

## 2012-12-22 DIAGNOSIS — I9789 Other postprocedural complications and disorders of the circulatory system, not elsewhere classified: Secondary | ICD-10-CM | POA: Insufficient documentation

## 2012-12-22 DIAGNOSIS — I4891 Unspecified atrial fibrillation: Secondary | ICD-10-CM | POA: Insufficient documentation

## 2012-12-22 NOTE — Assessment & Plan Note (Signed)
No complaints of angina even with fairly intense exertion

## 2012-12-22 NOTE — Progress Notes (Signed)
Patient ID: Marcus [name], male   DOB: 1945/05/27, 67 y.o.   MRN: [id number]     Reason for office visit Retention followup; history of Bentall procedure with mechanical AVR and coronary bypass surgery  Marcus [name] has no serious somatic complaints but has a lot of neurological/psychiatric issues since we switched his medications. He had taken bystolic for blood pressure control until he appeared to develop symptomatic bradycardia. We stopped his medication and started amlodipine 5 mg daily. Ever since then he has complained of nervousness, anxiety, dry mouth, vivid dreams and an inability to concentrate. 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  Allergies  Allergen Reactions  . Tetanus Toxoids Anaphylaxis  . Crestor [Rosuvastatin]   . Penicillins     Hives     Current Outpatient Prescriptions  Medication Sig Dispense Refill  . amLODipine (NORVASC) 5 MG tablet Take 1 tablet (5 mg total) by mouth daily.  180 tablet  3  . Ascorbic Acid (VITAMIN C) 1000 MG tablet Take 1,000 mg by mouth daily.      Marland Kitchen aspirin EC 81 MG tablet Take 81 mg by mouth daily.      Marland Kitchen atorvastatin (LIPITOR) 80 MG tablet Take 80 mg by mouth daily.      . Calcium Carb-Cholecalciferol (CALCIUM 1000 + D PO) Take 2,400 mg by mouth daily.       . cetirizine (ZYRTEC) 10 MG tablet Take 10 mg by mouth daily.      . Cholecalciferol (VITAMIN D) 2000 UNITS CAPS Take 1 capsule by mouth daily.      Marland Kitchen CINNAMON PO Take 1 tablet by mouth daily.      . fluticasone (VERAMYST) 27.5 MCG/SPRAY nasal spray Place 2 sprays into the nose daily.      . Multiple Vitamins-Minerals (MULTIVITAMIN WITH MINERALS) tablet Take 1 tablet by mouth daily.      . Omega-3 Fatty Acids (FISH OIL) 1200 MG CAPS Take 1 capsule by mouth 2 (two) times daily.      . ramipril (ALTACE) 10 MG capsule TAKE ONE CAPSULE BY MOUTH EVERY DAY  90 capsule  3  . warfarin  (COUMADIN) 10 MG tablet Take 10-15 mg by mouth daily. Takes 10 mg on Monday and Friday and 15 mg all other days       No current facility-administered medications for this visit.    Past Medical History  Diagnosis Date  . Hypertension   . Atrial fibrillation     ablation  . Obstructive sleep apnea 10/25/2007    AHI-0.71/hr  . Swelling of limb     LEA VENOUS, 08/22/2009 - no evidence of deep vein or superficial thrombosis; partially rupturing Baker's Cyst  . S/P AVR     2D ECHO, 04/25/2011 - EF >55%, Right ventricle-mild-moderately dilated    Past Surgical History  Procedure Laterality Date  . Cardiac surgery    . Cardioversion  08/02/2007    150 Joule biphasic shock with restoration of sinus rhythm. Heart rate 60.  . Cardiac catheterization Bilateral 05/10/2007    Significant 1-vessel disease, severely dilated aortic root with moderate severe aortic insufficiency  . Nm myoview ltd  04/08/2007    No evidence of inducible myocardial ischemia    Family History  Problem Relation Age of Onset  . Cancer Father     History   Social History  . Marital Status: Married  Spouse Name: N/A    Number of Children: N/A  . Years of Education: N/A   Occupational History  . Not on file.   Social History Main Topics  . Smoking status: Former Smoker    Types: Cigarettes    Quit date: 04/11/1987  . Smokeless tobacco: Never Used  . Alcohol Use: No  . Drug Use: No  . Sexual Activity: Not on file   Other Topics Concern  . Not on file   Social History Narrative  . No narrative on file    Review of systems: The patient specifically denies any chest pain at rest or with exertion, dyspnea at rest or with exertion, orthopnea, paroxysmal nocturnal dyspnea, syncope, palpitations, focal neurological deficits, intermittent claudication, lower extremity edema, unexplained weight gain, cough, hemoptysis or wheezing.  The patient also denies abdominal pain, nausea, vomiting, dysphagia,  diarrhea, constipation, polyuria, polydipsia, dysuria, hematuria, frequency, urgency, abnormal bleeding or bruising, fever, chills, unexpected weight changes, mood swings, change in skin or hair texture, change in voice quality, auditory or visual problems, allergic reactions or rashes, new musculoskeletal complaints other than usual "aches and pains".    PHYSICAL EXAM BP 136/84  Pulse 64  Resp 20  Ht 5\' 10"  (1.778 m)  Wt 188 lb 12.8 oz (85.639 kg)  BMI 27.09 kg/m2  General: Alert, oriented x3, no distress Head: no evidence of trauma, PERRL, EOMI, no exophtalmos or lid lag, no myxedema, no xanthelasma; normal ears, nose and oropharynx Neck: normal jugular venous pulsations and no hepatojugular reflux; brisk carotid pulses without delay and no carotid bruits Chest: clear to auscultation, no signs of consolidation by percussion or palpation, normal fremitus, symmetrical and full respiratory excursions Cardiovascular: normal position and quality of the apical impulse, regular rhythm, normal first and second heart sounds, very crisp prosthetic valve clicks no murmurs, rubs or gallops Abdomen: no tenderness or distention, no masses by palpation, no abnormal pulsatility or arterial bruits, normal bowel sounds, no hepatosplenomegaly Extremities: no clubbing, cyanosis or edema; 2+ radial, ulnar and brachial pulses bilaterally; 2+ right femoral, posterior tibial and dorsalis pedis pulses; 2+ left femoral, posterior tibial and dorsalis pedis pulses; no subclavian or femoral bruits Neurological: grossly nonfocal  BMET    Component Value Date/Time   NA 138 09/05/2012 1246   K 5.0 09/05/2012 1246   CL 103 09/05/2012 1246   CO2 27 09/05/2012 1246   GLUCOSE 93 09/05/2012 1246   BUN 14 09/05/2012 1246   CREATININE 0.82 09/05/2012 1246   CALCIUM 9.1 09/05/2012 1246   GFRNONAA 89* 09/05/2012 1246   GFRAA >90 09/05/2012 1246     ASSESSMENT AND PLAN H/O aortic valve replacement Normal device function by his  last echocardiogram.Peak and mean gradients of 26 and .    CAD (coronary artery disease) No complaints of angina even with fairly intense exertion  S/P thoracic aortic aneurysm repair    HTN, goal below 130/80 Even though his blood pressure today is not perfect, have recommended that he discontinue the amlodipine. I have told him that the side effects that he describes to not sound typical for this particular agent. It sounds more like anxiety and possibly depression. He will keep a record of his blood pressure at home and send it to me.  OSA (obstructive sleep apnea) Recently saw Dr. Tresa Endo in followup.   history of postop atrial fibrillation requiring cardioversion in 2009 consider an event monitor Eurydice Calixto  Thurmon Fair, MD, [facility] [phone] office [phone] pager

## 2012-12-22 NOTE — Assessment & Plan Note (Addendum)
Even though his blood pressure today is not perfect, have recommended that he discontinue the amlodipine. I have told him that the side effects that he describes to not sound typical for this particular agent. It sounds more like anxiety and possibly depression. He will keep a record of his blood pressure at home and send it to me.

## 2012-12-22 NOTE — Assessment & Plan Note (Signed)
Normal device function by his last echocardiogram.Peak and mean gradients of 26 and .

## 2012-12-22 NOTE — Assessment & Plan Note (Signed)
Recently saw Dr. Tresa Endo in followup.

## 2012-12-24 ENCOUNTER — Telehealth: Payer: Self-pay | Admitting: Cardiovascular Disease

## 2012-12-24 NOTE — Telephone Encounter (Signed)
Forwarded to Barbara, CMA.  

## 2012-12-24 NOTE — Telephone Encounter (Signed)
Returned call.  Voicemail not set up.  Message has been forwarded to [facility].    PLEASE INFORM PT/WIFE that message received and sent to [facility], who is in the middle of seeing patients in clinic.  She will call back when she is available or they can leave a message with concerns.  Thanks.  Tavarius Grewe M. Lorin Picket, BSN, RN

## 2012-12-24 NOTE — Telephone Encounter (Signed)
Just monitor BP. Keep a daily record of BP sitting down calmly at least 10 minutes and send to [place] after 2 weeks. Effects of amlodipine on BP can last that long.

## 2012-12-24 NOTE — Telephone Encounter (Signed)
Wife will check his BP daily for the next 2 weeks and send [place] a record of the readings.

## 2012-12-24 NOTE — Telephone Encounter (Signed)
Per wife Marcus [name] is almost 100% better. Since stopping amlodipine only one dream the night after his last dose.  He felt so good yesterday he went out on a job.  BP's are running 140/87 - 134/82.  Does Dr. Salena Saner want him to try another BP med.  Will review w/Dr. C and let him know.

## 2012-12-24 NOTE — Telephone Encounter (Signed)
Was told by Dr C to call today ands ask for [facility].

## 2012-12-24 NOTE — Telephone Encounter (Signed)
Would like to speak to Marcus [name] about her husband Marcus [name] . Please Call    Thanks

## 2012-12-30 ENCOUNTER — Ambulatory Visit (INDEPENDENT_AMBULATORY_CARE_PROVIDER_SITE_OTHER): Payer: Medicare Other | Admitting: Pharmacist Clinician (PhC)/ Clinical Pharmacy Specialist

## 2012-12-30 DIAGNOSIS — Z954 Presence of other heart-valve replacement: Secondary | ICD-10-CM

## 2012-12-30 DIAGNOSIS — Z952 Presence of prosthetic heart valve: Secondary | ICD-10-CM

## 2012-12-30 DIAGNOSIS — Z7901 Long term (current) use of anticoagulants: Secondary | ICD-10-CM

## 2012-12-30 LAB — POCT INR: INR: 3.2

## 2013-01-13 ENCOUNTER — Telehealth: Payer: Self-pay | Admitting: Cardiovascular Disease

## 2013-01-13 NOTE — Telephone Encounter (Signed)
Calling w/BP reading over the past 14 days average: 131/78    HR: 64.  She will fax the list over for Dr. C.'s review. They are leaving for vacation Thursday.

## 2013-01-13 NOTE — Telephone Encounter (Signed)
Calling to give the report on Marcus [name]'s bp as Cr.[name] asked .Marland Kitchen Please Call     Thanks

## 2013-01-31 LAB — POCT INR: INR: 2.7

## 2013-02-03 ENCOUNTER — Ambulatory Visit (INDEPENDENT_AMBULATORY_CARE_PROVIDER_SITE_OTHER): Payer: Medicare Other | Admitting: Pharmacist Clinician (PhC)/ Clinical Pharmacy Specialist

## 2013-02-03 DIAGNOSIS — Z954 Presence of other heart-valve replacement: Secondary | ICD-10-CM

## 2013-02-03 DIAGNOSIS — Z952 Presence of prosthetic heart valve: Secondary | ICD-10-CM

## 2013-02-03 DIAGNOSIS — Z7901 Long term (current) use of anticoagulants: Secondary | ICD-10-CM

## 2013-02-13 ENCOUNTER — Other Ambulatory Visit: Payer: Self-pay

## 2013-02-14 ENCOUNTER — Telehealth: Payer: Self-pay | Admitting: Cardiovascular Disease

## 2013-02-14 NOTE — Telephone Encounter (Signed)
His BP on average was 130s/80s and I would not add back any medicine. His valve will not be hurt by an occasional BP over 150. Please call us if SBP consistently >140 (average of his readings was 134).

## 2013-02-14 NOTE — Telephone Encounter (Signed)
Returned call.  Left message to call back before 4pm.  

## 2013-02-14 NOTE — Telephone Encounter (Signed)
Returned call and pt verified x 2 w/ wife, [name].  Stated she called in a few weeks ago about pt's BP and has been waiting on response on what to do about pt's BP med.  Stated she did fax in BP recordings as requested by Britta Mccreedy.  Stated pt has been off BP med since she first called about this and BP has gotten up to 150s/80s a few times.  Stated pt is concerned the high BPs are going to affect his valve.  Wife informed Dr. Royann Shivers will be notified for further instructions.  Verbalized understanding.  Message forwarded to Dr. Royann Shivers.

## 2013-02-14 NOTE — Telephone Encounter (Signed)
Calling because someone was supposed to get back with him about his blood pressure medication .Marland Kitchen Please call on cell phone .Marland Kitchen [phone] or [phone]    Thanks

## 2013-02-17 NOTE — Telephone Encounter (Signed)
Returned call and informed wife per instructions by MD.  Verbalized understanding and agreed w/ plan.

## 2013-03-02 LAB — POCT INR: INR: 3.4

## 2013-03-05 ENCOUNTER — Ambulatory Visit (INDEPENDENT_AMBULATORY_CARE_PROVIDER_SITE_OTHER): Payer: Medicare Other | Admitting: Pharmacist Clinician (PhC)/ Clinical Pharmacy Specialist

## 2013-03-05 DIAGNOSIS — Z7901 Long term (current) use of anticoagulants: Secondary | ICD-10-CM

## 2013-03-05 DIAGNOSIS — Z954 Presence of other heart-valve replacement: Secondary | ICD-10-CM

## 2013-03-05 DIAGNOSIS — Z952 Presence of prosthetic heart valve: Secondary | ICD-10-CM

## 2013-03-21 ENCOUNTER — Ambulatory Visit: Payer: Medicare Other | Admitting: Cardiovascular Disease

## 2013-03-28 ENCOUNTER — Ambulatory Visit (INDEPENDENT_AMBULATORY_CARE_PROVIDER_SITE_OTHER): Payer: Medicare Other | Admitting: Pharmacist Clinician (PhC)/ Clinical Pharmacy Specialist

## 2013-03-28 DIAGNOSIS — Z7901 Long term (current) use of anticoagulants: Secondary | ICD-10-CM

## 2013-03-28 DIAGNOSIS — Z954 Presence of other heart-valve replacement: Secondary | ICD-10-CM

## 2013-03-28 DIAGNOSIS — Z952 Presence of prosthetic heart valve: Secondary | ICD-10-CM

## 2013-03-28 LAB — POCT INR: INR: 2.9

## 2013-05-03 LAB — POCT INR: INR: 2.6

## 2013-05-07 ENCOUNTER — Ambulatory Visit (INDEPENDENT_AMBULATORY_CARE_PROVIDER_SITE_OTHER): Payer: Medicare Other | Admitting: Pharmacist Clinician (PhC)/ Clinical Pharmacy Specialist

## 2013-05-07 DIAGNOSIS — Z952 Presence of prosthetic heart valve: Secondary | ICD-10-CM

## 2013-05-07 DIAGNOSIS — Z7901 Long term (current) use of anticoagulants: Secondary | ICD-10-CM

## 2013-05-07 DIAGNOSIS — Z954 Presence of other heart-valve replacement: Secondary | ICD-10-CM

## 2013-05-08 ENCOUNTER — Encounter: Payer: Self-pay | Admitting: Cardiovascular Disease

## 2013-05-08 ENCOUNTER — Ambulatory Visit: Payer: Medicare Other | Admitting: Cardiovascular Disease

## 2013-05-08 ENCOUNTER — Ambulatory Visit (INDEPENDENT_AMBULATORY_CARE_PROVIDER_SITE_OTHER): Payer: Medicare Other | Admitting: Cardiovascular Disease

## 2013-05-08 VITALS — BP 122/84 | HR 52 | Ht 71.0 in | Wt 190.0 lb

## 2013-05-08 DIAGNOSIS — I4891 Unspecified atrial fibrillation: Secondary | ICD-10-CM

## 2013-05-08 DIAGNOSIS — I9789 Other postprocedural complications and disorders of the circulatory system, not elsewhere classified: Secondary | ICD-10-CM

## 2013-05-08 DIAGNOSIS — Z952 Presence of prosthetic heart valve: Secondary | ICD-10-CM

## 2013-05-08 DIAGNOSIS — Z954 Presence of other heart-valve replacement: Secondary | ICD-10-CM

## 2013-05-08 DIAGNOSIS — Z9889 Other specified postprocedural states: Secondary | ICD-10-CM

## 2013-05-08 DIAGNOSIS — I10 Essential (primary) hypertension: Secondary | ICD-10-CM

## 2013-05-08 DIAGNOSIS — Z8679 Personal history of other diseases of the circulatory system: Secondary | ICD-10-CM

## 2013-05-08 DIAGNOSIS — I2510 Atherosclerotic heart disease of native coronary artery without angina pectoris: Secondary | ICD-10-CM

## 2013-05-08 NOTE — Patient Instructions (Signed)
Your physician wants you to follow-up in: 1 year. You will receive a reminder letter in the mail two months in advance. If you don't receive a letter, please call our office to schedule the follow-up appointment.  

## 2013-05-21 ENCOUNTER — Encounter: Payer: Self-pay | Admitting: Cardiovascular Disease

## 2013-05-21 NOTE — Assessment & Plan Note (Signed)
Asymptomatic. Normal nuclear perfusion study June 2014. Excellent functional capacity.

## 2013-05-21 NOTE — Assessment & Plan Note (Signed)
Recommend reimaging of the thoracic aorta this year. He did not have a bicuspid aortic valve as a cause of ascending aortic aneurysm. 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 Recommend CT angio of the aorta.

## 2013-05-21 NOTE — Assessment & Plan Note (Signed)
Required cardioversion postop in 2009 but no clinical recurrence since

## 2013-05-21 NOTE — Progress Notes (Signed)
Patient ID: Marcus [name], male   DOB: 01-14-1946, 68 y.o.   MRN: [id number]      Reason for office visit Status post mechanical aortic valve replacement/ascending aortic aneurysm repair CAD status post CABG  Marcus [name] feels a lot better after stopping treatment with amlodipine. This drug was causing unusual side effects including strange dreams and extreme anxiety. All of these side effects resolved when he discontinued the medication. His blood pressure is well controlled with the new regimen. 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  Allergies  Allergen Reactions  . Amlodipine Anaphylaxis    Weakness and fatigue   . Tetanus Toxoids Anaphylaxis  . Crestor [Rosuvastatin]   . Penicillins     Hives     Current Outpatient Prescriptions  Medication Sig Dispense Refill  . Ascorbic Acid (VITAMIN C) 1000 MG tablet Take 1,000 mg by mouth daily.      Marland Kitchen aspirin EC 81 MG tablet Take 81 mg by mouth daily.      Marland Kitchen atorvastatin (LIPITOR) 80 MG tablet Take 80 mg by mouth daily.      . Calcium Carb-Cholecalciferol (CALCIUM 1000 + D PO) Take 2,400 mg by mouth daily.       . cetirizine (ZYRTEC) 10 MG tablet Take 10 mg by mouth daily.      . Cholecalciferol (VITAMIN D) 2000 UNITS CAPS Take 1 capsule by mouth daily.      Marland Kitchen CINNAMON PO Take 1 tablet by mouth daily.      . fluticasone (VERAMYST) 27.5 MCG/SPRAY nasal spray Place 2 sprays into the nose daily.      . Multiple Vitamins-Minerals (MULTIVITAMIN WITH MINERALS) tablet Take 1 tablet by mouth daily.      . Omega-3 Fatty Acids (FISH OIL) 1200 MG CAPS Take 1 capsule by mouth 2 (two) times daily.      . ramipril (ALTACE) 10 MG capsule TAKE ONE CAPSULE BY MOUTH EVERY DAY  90 capsule  3  . warfarin (COUMADIN) 10 MG tablet Take 10-15 mg by mouth daily. Takes 10 mg on Monday and Friday and 15 mg all other days       No current facility-administered medications for this visit.    Past Medical History  Diagnosis  Date  . Hypertension   . Atrial fibrillation     ablation  . Obstructive sleep apnea 10/25/2007    AHI-0.71/hr  . Swelling of limb     LEA VENOUS, 08/22/2009 - no evidence of deep vein or superficial thrombosis; partially rupturing Baker's Cyst  . S/P AVR     2D ECHO, 04/25/2011 - EF >55%, Right ventricle-mild-moderately dilated    Past Surgical History  Procedure Laterality Date  . Cardiac surgery    . Cardioversion  08/02/2007    150 Joule biphasic shock with restoration of sinus rhythm. Heart rate 60.  . Cardiac catheterization Bilateral 05/10/2007    Significant 1-vessel disease, severely dilated aortic root with moderate severe aortic insufficiency  . Nm myoview ltd  04/08/2007    No evidence of inducible myocardial ischemia    Family History  Problem Relation Age of Onset  . Cancer Father     History   Social History  . Marital Status: Married    Spouse Name: N/A    Number of Children: N/A  . Years of Education: N/A   Occupational History  . Not on file.   Social History Main Topics  .  Smoking status: Former Smoker    Types: Cigarettes    Quit date: 04/11/1987  . Smokeless tobacco: Never Used  . Alcohol Use: No  . Drug Use: No  . Sexual Activity: Not on file   Other Topics Concern  . Not on file   Social History Narrative  . No narrative on file    Review of systems: The patient specifically denies any chest pain at rest exertion, dyspnea at rest or with exertion, orthopnea, paroxysmal nocturnal dyspnea, syncope, palpitations, focal neurological deficits, intermittent claudication, lower extremity edema, unexplained weight gain, cough, hemoptysis or wheezing.   PHYSICAL EXAM BP 122/84  Pulse 52  Ht 5\' 11"  (1.803 m)  Wt 86.183 kg (190 lb)  BMI 26.51 kg/m2  General: Alert, oriented x3, no distress Head: no evidence of trauma, PERRL, EOMI, no exophtalmos or lid lag, no myxedema, no xanthelasma; normal ears, nose and oropharynx Neck: normal jugular  venous pulsations and no hepatojugular reflux; brisk carotid pulses without delay and no carotid bruits Chest: clear to auscultation, no signs of consolidation by percussion or palpation, normal fremitus, symmetrical and full respiratory excursions Cardiovascular: normal position and quality of the apical impulse, regular rhythm, normal first and second heart sounds, no  rubs or gallops. Prosthetic valve clicks are very sharp. Early peaking grade 2/6 systolic ejection murmur. No diastolic murmur Abdomen: no tenderness or distention, no masses by palpation, no abnormal pulsatility or arterial bruits, normal bowel sounds, no hepatosplenomegaly Extremities: no clubbing, cyanosis or edema; 2+ radial, ulnar and brachial pulses bilaterally; 2+ right femoral, posterior tibial and dorsalis pedis pulses; 2+ left femoral, posterior tibial and dorsalis pedis pulses; no subclavian or femoral bruits Neurological: grossly nonfocal   EKG: Sinus bradycardia, right bundle branch block, unchanged   BMET    Component Value Date/Time   NA 138 09/05/2012 1246   K 5.0 09/05/2012 1246   CL 103 09/05/2012 1246   CO2 27 09/05/2012 1246   GLUCOSE 93 09/05/2012 1246   BUN 14 09/05/2012 1246   CREATININE 0.82 09/05/2012 1246   CALCIUM 9.1 09/05/2012 1246   GFRNONAA 89* 09/05/2012 1246   GFRAA >90 09/05/2012 1246     ASSESSMENT AND PLAN No problem-specific assessment & plan notes found for this encounter.  Orders Placed This Encounter  Procedures  . EKG 12-Lead   No orders of the defined types were placed in this encounter.    Holli Humbles, MD, [place] [phone] office [phone] pager

## 2013-05-21 NOTE — Assessment & Plan Note (Addendum)
25 mm St. Jude mechanical valve conduit with reimplantation of the right and left coronary arteries, SVG x2 to RCA and OM for ascending aortic aneurysm, severe aortic insufficiency and two-vessel CAD, Dr. Cyndia Bent, February 2009. Asymptomatic. Therapeutic anticoagulation without any bleeding complications her about the events. Normal prosthetic valve function by echocardiogram July 2014.

## 2013-05-21 NOTE — Assessment & Plan Note (Signed)
The pressure control is fair. Unable to receive beta blockers due to bradycardia, intolerance of amlodipine.

## 2013-05-26 ENCOUNTER — Telehealth: Payer: Self-pay | Admitting: *Deleted

## 2013-05-26 DIAGNOSIS — Z79899 Other long term (current) drug therapy: Secondary | ICD-10-CM

## 2013-05-26 DIAGNOSIS — Z8679 Personal history of other diseases of the circulatory system: Secondary | ICD-10-CM

## 2013-05-26 NOTE — Telephone Encounter (Signed)
Message copied by Tressa Busman on Mon May 26, 2013  9:18 AM ------      Message from: [name]      Created: Wed May 21, 2013  9:15 AM       Was reviewing his chart and he needs to have a followup CT angiogram of the thoracic and abdominal aorta to screen for new aneurysm formation. Reason history of aortic aneurysm. Nonurgent. ------

## 2013-05-26 NOTE — Telephone Encounter (Signed)
CT angio to be scheduled around the 3rd week of March at the patients request.  He will be having labs done through his PCP prior to the CT.

## 2013-06-01 LAB — POCT INR: INR: 4.6

## 2013-06-02 ENCOUNTER — Ambulatory Visit (INDEPENDENT_AMBULATORY_CARE_PROVIDER_SITE_OTHER): Payer: Medicare Other | Admitting: Pharmacist Clinician (PhC)/ Clinical Pharmacy Specialist

## 2013-06-02 DIAGNOSIS — Z952 Presence of prosthetic heart valve: Secondary | ICD-10-CM

## 2013-06-02 DIAGNOSIS — Z7901 Long term (current) use of anticoagulants: Secondary | ICD-10-CM

## 2013-06-02 DIAGNOSIS — Z954 Presence of other heart-valve replacement: Secondary | ICD-10-CM

## 2013-06-16 LAB — POCT INR: INR: 2.5

## 2013-06-17 ENCOUNTER — Ambulatory Visit (INDEPENDENT_AMBULATORY_CARE_PROVIDER_SITE_OTHER): Payer: Medicare Other | Admitting: Pharmacist Clinician (PhC)/ Clinical Pharmacy Specialist

## 2013-06-17 DIAGNOSIS — Z7901 Long term (current) use of anticoagulants: Secondary | ICD-10-CM

## 2013-06-17 DIAGNOSIS — Z954 Presence of other heart-valve replacement: Secondary | ICD-10-CM

## 2013-06-17 DIAGNOSIS — Z952 Presence of prosthetic heart valve: Secondary | ICD-10-CM

## 2013-06-30 ENCOUNTER — Ambulatory Visit
Admission: RE | Admit: 2013-06-30 | Discharge: 2013-06-30 | Disposition: A | Discharge status: Home / Self Care | Payer: Medicare Other | Source: Ambulatory Visit | Attending: Cardiovascular Disease | Admitting: Cardiovascular Disease

## 2013-06-30 DIAGNOSIS — Z8679 Personal history of other diseases of the circulatory system: Secondary | ICD-10-CM

## 2013-06-30 MED ORDER — IOHEXOL 350 MG/ML SOLN
80.0000 mL | Freq: Once | INTRAVENOUS | Status: AC | PRN
  Administered 2013-06-30: 80 mL via INTRAVENOUS

## 2013-07-08 ENCOUNTER — Other Ambulatory Visit: Payer: Self-pay | Admitting: Cardiovascular Disease

## 2013-07-20 LAB — POCT INR: INR: 2.8

## 2013-07-21 ENCOUNTER — Ambulatory Visit (INDEPENDENT_AMBULATORY_CARE_PROVIDER_SITE_OTHER): Payer: Medicare Other | Admitting: Pharmacist Clinician (PhC)/ Clinical Pharmacy Specialist

## 2013-07-21 DIAGNOSIS — Z954 Presence of other heart-valve replacement: Secondary | ICD-10-CM

## 2013-07-21 DIAGNOSIS — Z7901 Long term (current) use of anticoagulants: Secondary | ICD-10-CM

## 2013-07-21 DIAGNOSIS — Z952 Presence of prosthetic heart valve: Secondary | ICD-10-CM

## 2013-07-28 DIAGNOSIS — Z0279 Encounter for issue of other medical certificate: Secondary | ICD-10-CM

## 2013-08-19 LAB — POCT INR: INR: 1.6

## 2013-08-20 ENCOUNTER — Ambulatory Visit (INDEPENDENT_AMBULATORY_CARE_PROVIDER_SITE_OTHER): Payer: Medicare Other | Admitting: Pharmacist Clinician (PhC)/ Clinical Pharmacy Specialist

## 2013-08-20 DIAGNOSIS — Z7901 Long term (current) use of anticoagulants: Secondary | ICD-10-CM

## 2013-08-20 DIAGNOSIS — Z952 Presence of prosthetic heart valve: Secondary | ICD-10-CM

## 2013-08-20 DIAGNOSIS — Z954 Presence of other heart-valve replacement: Secondary | ICD-10-CM

## 2013-08-31 LAB — POCT INR: INR: 2.8

## 2013-09-03 ENCOUNTER — Ambulatory Visit (INDEPENDENT_AMBULATORY_CARE_PROVIDER_SITE_OTHER): Payer: Medicare Other | Admitting: Pharmacist Clinician (PhC)/ Clinical Pharmacy Specialist

## 2013-09-03 DIAGNOSIS — Z954 Presence of other heart-valve replacement: Secondary | ICD-10-CM

## 2013-09-03 DIAGNOSIS — Z7901 Long term (current) use of anticoagulants: Secondary | ICD-10-CM

## 2013-09-03 DIAGNOSIS — Z952 Presence of prosthetic heart valve: Secondary | ICD-10-CM

## 2013-09-28 LAB — POCT INR: INR: 3

## 2013-09-29 ENCOUNTER — Ambulatory Visit (INDEPENDENT_AMBULATORY_CARE_PROVIDER_SITE_OTHER): Payer: Medicare Other | Admitting: Pharmacist Clinician (PhC)/ Clinical Pharmacy Specialist

## 2013-09-29 DIAGNOSIS — Z952 Presence of prosthetic heart valve: Secondary | ICD-10-CM

## 2013-09-29 DIAGNOSIS — Z7901 Long term (current) use of anticoagulants: Secondary | ICD-10-CM

## 2013-09-29 DIAGNOSIS — Z954 Presence of other heart-valve replacement: Secondary | ICD-10-CM

## 2013-10-26 LAB — POCT INR: INR: 2.7

## 2013-10-28 ENCOUNTER — Ambulatory Visit (INDEPENDENT_AMBULATORY_CARE_PROVIDER_SITE_OTHER): Payer: Medicare Other | Admitting: Pharmacist Clinician (PhC)/ Clinical Pharmacy Specialist

## 2013-10-28 DIAGNOSIS — Z952 Presence of prosthetic heart valve: Secondary | ICD-10-CM

## 2013-10-28 DIAGNOSIS — Z7901 Long term (current) use of anticoagulants: Secondary | ICD-10-CM

## 2013-10-28 DIAGNOSIS — Z954 Presence of other heart-valve replacement: Secondary | ICD-10-CM

## 2013-11-17 ENCOUNTER — Encounter: Payer: Self-pay | Admitting: Cardiovascular Disease

## 2013-11-27 LAB — POCT INR: INR: 3.6

## 2013-11-28 ENCOUNTER — Ambulatory Visit (INDEPENDENT_AMBULATORY_CARE_PROVIDER_SITE_OTHER): Payer: Medicare Other | Admitting: Pharmacist Clinician (PhC)/ Clinical Pharmacy Specialist

## 2013-11-28 DIAGNOSIS — Z952 Presence of prosthetic heart valve: Secondary | ICD-10-CM

## 2013-11-28 DIAGNOSIS — Z7901 Long term (current) use of anticoagulants: Secondary | ICD-10-CM

## 2013-11-28 DIAGNOSIS — Z954 Presence of other heart-valve replacement: Secondary | ICD-10-CM

## 2013-12-28 LAB — PROTIME-INR: INR: 3.9 — AB (ref 0.9–1.1)

## 2013-12-30 ENCOUNTER — Other Ambulatory Visit: Payer: Self-pay | Admitting: Cardiovascular Disease

## 2013-12-30 ENCOUNTER — Other Ambulatory Visit: Payer: Self-pay | Admitting: Pharmacist Clinician (PhC)/ Clinical Pharmacy Specialist

## 2013-12-30 NOTE — Telephone Encounter (Signed)
Rx was sent to pharmacy electronically. 

## 2014-01-30 ENCOUNTER — Ambulatory Visit (INDEPENDENT_AMBULATORY_CARE_PROVIDER_SITE_OTHER): Payer: Medicare Other | Admitting: Pharmacist Clinician (PhC)/ Clinical Pharmacy Specialist

## 2014-01-30 DIAGNOSIS — Z952 Presence of prosthetic heart valve: Secondary | ICD-10-CM

## 2014-01-30 DIAGNOSIS — Z954 Presence of other heart-valve replacement: Secondary | ICD-10-CM

## 2014-01-30 LAB — POCT INR: INR: 3.9

## 2014-02-13 LAB — POCT INR: INR: 1.9

## 2014-02-16 ENCOUNTER — Ambulatory Visit (INDEPENDENT_AMBULATORY_CARE_PROVIDER_SITE_OTHER): Payer: Medicare Other | Admitting: Pharmacist Clinician (PhC)/ Clinical Pharmacy Specialist

## 2014-02-16 DIAGNOSIS — Z952 Presence of prosthetic heart valve: Secondary | ICD-10-CM

## 2014-02-16 DIAGNOSIS — Z954 Presence of other heart-valve replacement: Secondary | ICD-10-CM

## 2014-02-27 LAB — POCT INR: INR: 4

## 2014-03-02 ENCOUNTER — Ambulatory Visit (INDEPENDENT_AMBULATORY_CARE_PROVIDER_SITE_OTHER): Payer: Medicare Other | Admitting: Pharmacist Clinician (PhC)/ Clinical Pharmacy Specialist

## 2014-03-02 DIAGNOSIS — Z952 Presence of prosthetic heart valve: Secondary | ICD-10-CM

## 2014-03-02 DIAGNOSIS — Z954 Presence of other heart-valve replacement: Secondary | ICD-10-CM

## 2014-03-03 ENCOUNTER — Other Ambulatory Visit: Payer: Self-pay | Admitting: Cardiovascular Disease

## 2014-03-03 NOTE — Telephone Encounter (Signed)
Clindamycin 600 mg PO 1 hour before procedure, please call in to his pharmacy

## 2014-03-03 NOTE — Telephone Encounter (Signed)
Pt need a prescription for his antibiotic for dental work. Please call to [facility] in [place] on [address].

## 2014-03-03 NOTE — Telephone Encounter (Signed)
Rx called in to pharmacy. 

## 2014-03-23 ENCOUNTER — Ambulatory Visit (INDEPENDENT_AMBULATORY_CARE_PROVIDER_SITE_OTHER): Payer: Medicare Other | Admitting: Pharmacist Clinician (PhC)/ Clinical Pharmacy Specialist

## 2014-03-23 ENCOUNTER — Telehealth: Payer: Self-pay | Admitting: Cardiovascular Disease

## 2014-03-23 DIAGNOSIS — Z952 Presence of prosthetic heart valve: Secondary | ICD-10-CM

## 2014-03-23 DIAGNOSIS — Z954 Presence of other heart-valve replacement: Secondary | ICD-10-CM

## 2014-03-23 LAB — POCT INR: INR: 4.6

## 2014-03-24 NOTE — Telephone Encounter (Signed)
See anticoag encounter

## 2014-03-25 ENCOUNTER — Telehealth: Payer: Self-pay | Admitting: Cardiovascular Disease

## 2014-03-25 NOTE — Telephone Encounter (Signed)
Pt has a cold,with a  terrible cough. 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 She wants to know what can he take over the counter?

## 2014-03-25 NOTE — Telephone Encounter (Signed)
Picked up Robitussin at a truck stop - is it OK to take.  He is in [place] driving back home.  Told to go to his PCP when he gets back if this doesn't clear up soon.  Voiced understanding.

## 2014-03-29 LAB — POCT INR: INR: 1.7

## 2014-03-30 ENCOUNTER — Ambulatory Visit (INDEPENDENT_AMBULATORY_CARE_PROVIDER_SITE_OTHER): Payer: Medicare Other | Admitting: Pharmacist Clinician (PhC)/ Clinical Pharmacy Specialist

## 2014-03-30 DIAGNOSIS — Z952 Presence of prosthetic heart valve: Secondary | ICD-10-CM

## 2014-03-30 DIAGNOSIS — Z954 Presence of other heart-valve replacement: Secondary | ICD-10-CM

## 2014-04-06 ENCOUNTER — Ambulatory Visit (INDEPENDENT_AMBULATORY_CARE_PROVIDER_SITE_OTHER): Payer: Medicare Other | Admitting: Pharmacist Clinician (PhC)/ Clinical Pharmacy Specialist

## 2014-04-06 DIAGNOSIS — Z954 Presence of other heart-valve replacement: Secondary | ICD-10-CM

## 2014-04-06 DIAGNOSIS — Z952 Presence of prosthetic heart valve: Secondary | ICD-10-CM

## 2014-04-06 LAB — POCT INR: INR: 2.3

## 2014-04-08 ENCOUNTER — Other Ambulatory Visit: Payer: Self-pay | Admitting: Pharmacist Clinician (PhC)/ Clinical Pharmacy Specialist

## 2014-04-14 LAB — POCT INR: INR: 2.5

## 2014-04-16 ENCOUNTER — Ambulatory Visit: Payer: Self-pay | Admitting: Pharmacist Clinician (PhC)/ Clinical Pharmacy Specialist

## 2014-04-16 DIAGNOSIS — Z952 Presence of prosthetic heart valve: Secondary | ICD-10-CM

## 2014-04-24 LAB — POCT INR: INR: 2.4

## 2014-04-30 ENCOUNTER — Ambulatory Visit (INDEPENDENT_AMBULATORY_CARE_PROVIDER_SITE_OTHER): Payer: Self-pay | Admitting: Pharmacist Clinician (PhC)/ Clinical Pharmacy Specialist

## 2014-04-30 DIAGNOSIS — Z954 Presence of other heart-valve replacement: Secondary | ICD-10-CM

## 2014-04-30 DIAGNOSIS — Z952 Presence of prosthetic heart valve: Secondary | ICD-10-CM

## 2014-05-05 LAB — POCT INR: INR: 3

## 2014-05-08 ENCOUNTER — Ambulatory Visit (INDEPENDENT_AMBULATORY_CARE_PROVIDER_SITE_OTHER): Payer: Self-pay | Admitting: Pharmacist Clinician (PhC)/ Clinical Pharmacy Specialist

## 2014-05-08 DIAGNOSIS — Z954 Presence of other heart-valve replacement: Secondary | ICD-10-CM

## 2014-05-08 DIAGNOSIS — Z952 Presence of prosthetic heart valve: Secondary | ICD-10-CM

## 2014-05-19 LAB — POCT INR: INR: 4.2

## 2014-05-20 ENCOUNTER — Ambulatory Visit (INDEPENDENT_AMBULATORY_CARE_PROVIDER_SITE_OTHER): Payer: Self-pay | Admitting: Pharmacist Clinician (PhC)/ Clinical Pharmacy Specialist

## 2014-05-20 DIAGNOSIS — Z954 Presence of other heart-valve replacement: Secondary | ICD-10-CM

## 2014-05-20 DIAGNOSIS — Z952 Presence of prosthetic heart valve: Secondary | ICD-10-CM

## 2014-06-05 ENCOUNTER — Telehealth: Payer: Self-pay | Admitting: Pharmacist Clinician (PhC)/ Clinical Pharmacy Specialist

## 2014-06-05 ENCOUNTER — Ambulatory Visit (INDEPENDENT_AMBULATORY_CARE_PROVIDER_SITE_OTHER): Payer: Self-pay | Admitting: Pharmacist Clinician (PhC)/ Clinical Pharmacy Specialist

## 2014-06-05 DIAGNOSIS — Z954 Presence of other heart-valve replacement: Secondary | ICD-10-CM

## 2014-06-05 DIAGNOSIS — Z952 Presence of prosthetic heart valve: Secondary | ICD-10-CM

## 2014-06-05 LAB — POCT INR: INR: 4

## 2014-06-05 NOTE — Telephone Encounter (Signed)
See anticoag note

## 2014-06-05 NOTE — Telephone Encounter (Signed)
Calling INR results   INR 4.0   Please call with instructions.

## 2014-06-21 LAB — POCT INR: INR: 3

## 2014-06-21 LAB — PROTIME-INR: INR: 3 — AB (ref 0.9–1.1)

## 2014-06-22 ENCOUNTER — Ambulatory Visit (INDEPENDENT_AMBULATORY_CARE_PROVIDER_SITE_OTHER): Payer: Self-pay | Admitting: Pharmacist Clinician (PhC)/ Clinical Pharmacy Specialist

## 2014-06-22 DIAGNOSIS — Z954 Presence of other heart-valve replacement: Secondary | ICD-10-CM

## 2014-06-22 DIAGNOSIS — Z952 Presence of prosthetic heart valve: Secondary | ICD-10-CM

## 2014-06-26 ENCOUNTER — Ambulatory Visit: Payer: Self-pay | Admitting: Cardiovascular Disease

## 2014-07-01 ENCOUNTER — Other Ambulatory Visit: Payer: Self-pay | Admitting: Cardiovascular Disease

## 2014-07-18 LAB — PROTIME-INR: INR: 3.3 — AB (ref 0.9–1.1)

## 2014-07-20 ENCOUNTER — Ambulatory Visit (INDEPENDENT_AMBULATORY_CARE_PROVIDER_SITE_OTHER): Payer: Self-pay | Admitting: Pharmacist Clinician (PhC)/ Clinical Pharmacy Specialist

## 2014-07-20 DIAGNOSIS — Z954 Presence of other heart-valve replacement: Secondary | ICD-10-CM

## 2014-07-20 DIAGNOSIS — Z952 Presence of prosthetic heart valve: Secondary | ICD-10-CM

## 2014-07-27 ENCOUNTER — Ambulatory Visit: Payer: Self-pay | Admitting: Cardiovascular Disease

## 2014-07-28 ENCOUNTER — Encounter: Payer: Self-pay | Admitting: Cardiovascular Disease

## 2014-08-07 ENCOUNTER — Encounter: Payer: Self-pay | Admitting: Cardiovascular Disease

## 2014-08-07 ENCOUNTER — Ambulatory Visit (INDEPENDENT_AMBULATORY_CARE_PROVIDER_SITE_OTHER): Payer: Medicare HMO | Admitting: Cardiovascular Disease

## 2014-08-07 VITALS — BP 112/62 | HR 47 | Ht 71.0 in | Wt 192.1 lb

## 2014-08-07 DIAGNOSIS — Z951 Presence of aortocoronary bypass graft: Secondary | ICD-10-CM

## 2014-08-07 DIAGNOSIS — I10 Essential (primary) hypertension: Secondary | ICD-10-CM | POA: Diagnosis not present

## 2014-08-07 DIAGNOSIS — Z952 Presence of prosthetic heart valve: Secondary | ICD-10-CM

## 2014-08-07 DIAGNOSIS — I4891 Unspecified atrial fibrillation: Secondary | ICD-10-CM

## 2014-08-07 DIAGNOSIS — Z954 Presence of other heart-valve replacement: Secondary | ICD-10-CM

## 2014-08-07 DIAGNOSIS — I9789 Other postprocedural complications and disorders of the circulatory system, not elsewhere classified: Secondary | ICD-10-CM

## 2014-08-07 DIAGNOSIS — I25810 Atherosclerosis of coronary artery bypass graft(s) without angina pectoris: Secondary | ICD-10-CM | POA: Diagnosis not present

## 2014-08-07 DIAGNOSIS — E785 Hyperlipidemia, unspecified: Secondary | ICD-10-CM

## 2014-08-07 DIAGNOSIS — G4733 Obstructive sleep apnea (adult) (pediatric): Secondary | ICD-10-CM

## 2014-08-07 DIAGNOSIS — Z7901 Long term (current) use of anticoagulants: Secondary | ICD-10-CM

## 2014-08-07 DIAGNOSIS — R001 Bradycardia, unspecified: Secondary | ICD-10-CM

## 2014-08-07 DIAGNOSIS — Z9889 Other specified postprocedural states: Secondary | ICD-10-CM

## 2014-08-07 DIAGNOSIS — Z8679 Personal history of other diseases of the circulatory system: Secondary | ICD-10-CM

## 2014-08-07 MED ORDER — CLINDAMYCIN HCL 300 MG PO CAPS: ORAL_CAPSULE | ORAL | Status: DC

## 2014-08-07 NOTE — Patient Instructions (Signed)
Your physician has requested that you have an echocardiogram. Echocardiography is a painless test that uses sound waves to create images of your heart. It provides your doctor with information about the size and shape of your heart and how well your heart's chambers and valves are working. This procedure takes approximately one hour. There are no restrictions for this procedure.  Dr Sallyanne Kuster has sent Clindamycin 300 mg - take 2 tablets (600 mg total) by mouth prior to dental or medical procedures.  Dr Sallyanne Kuster recommends that you schedule a follow-up appointment in 1 year. You will receive a reminder letter in the mail two months in advance. If you don't receive a letter, please call our office to schedule the follow-up appointment.

## 2014-08-07 NOTE — Progress Notes (Signed)
Patient ID: Marcus [name], male   DOB: 04/17/1945, 69 y.o.   MRN: [id number]     Cardiology Office Note   Date:  08/11/2014   ID:  Marcus [name], DOB [birth year], MRN [id number]  PCP:  Norberto Sorenson, MD  Cardiologist:   [name], MD   Chief Complaint  Patient presents with  . Annual Exam      History of Present Illness: Marcus [name] is a 69 y.o. male who presents for follow-up of aortic valve mechanical prosthesis, previous repair of ascending aortic aneurysm, severe asymptomatic sinus bradycardia and coronary artery disease with previous bypass surgery (Severe AI - Bentall 23 mm St. Jude AVR, CABG x 2 - SVG-RCA and SVG-OM 2008, [facility])  Thane feels well and has no complaints whatsoever. He denies chest pain or dyspnea with exertion and continues to work as a Administrator. Has always he is quite bradycardic with a heart rate in the mid 40s and is completely asymptomatic. He is compliant with CPAP for obstructive sleep apnea. He probably will soon need a repeat colonoscopy. His last procedure was performed without discontinuation of warfarin anticoagulation. He has not had any bleeding issues or any focal neurological events to suggest stroke, no other embolic events.  His last functional study was a normal nuclear stress test in June 2014 - normal perfusion and excellent exercise tolerance. His last echocardiogram showed normal aortic valve prosthesis was performed in July 2014. CT angiography of the aorta in March 2015 did not show any new areas of aortic enlargement.   Past Medical History  Diagnosis Date  . Hypertension   . Atrial fibrillation     ablation  . Obstructive sleep apnea 10/25/2007    AHI-0.71/hr  . Swelling of limb     LEA VENOUS, 08/22/2009 - no evidence of deep vein or superficial thrombosis; partially rupturing Baker's Cyst  . S/P AVR     2D ECHO, 04/25/2011 - EF >55%, Right ventricle-mild-moderately dilated    Past Surgical History  Procedure Laterality  Date  . Cardiac surgery    . Cardioversion  08/02/2007    150 Joule biphasic shock with restoration of sinus rhythm. Heart rate 60.  . Cardiac catheterization Bilateral 05/10/2007    Significant 1-vessel disease, severely dilated aortic root with moderate severe aortic insufficiency  . Nm myoview ltd  04/08/2007    No evidence of inducible myocardial ischemia     Current Outpatient Prescriptions  Medication Sig Dispense Refill  . Ascorbic Acid (VITAMIN C) 1000 MG tablet Take 1,000 mg by mouth daily.    Marland Kitchen aspirin EC 81 MG tablet Take 81 mg by mouth daily.    Marland Kitchen atorvastatin (LIPITOR) 80 MG tablet Take 80 mg by mouth daily.    . Calcium Carb-Cholecalciferol (CALCIUM 1000 + D PO) Take 1,200 mg by mouth daily.     . cetirizine (ZYRTEC) 10 MG tablet Take 10 mg by mouth daily.    . Cholecalciferol (VITAMIN D) 2000 UNITS CAPS Take 1 capsule by mouth daily.    Marland Kitchen CINNAMON PO Take 1 tablet by mouth daily.    . fluticasone (VERAMYST) 27.5 MCG/SPRAY nasal spray Place 2 sprays into the nose as needed for allergies.     . Multiple Vitamins-Minerals (MULTIVITAMIN WITH MINERALS) tablet Take 1 tablet by mouth daily.    . Omega-3 Krill Oil 500 MG CAPS Take 1 capsule by mouth daily.    . ramipril (ALTACE) 10 MG capsule Take 1 capsule (10 mg total) by mouth daily.  90 capsule 0  . vitamin B-12 (CYANOCOBALAMIN) 1000 MCG tablet Take 1,000 mcg by mouth daily.    Marland Kitchen warfarin (COUMADIN) 10 MG tablet TAKE 1 AND 1/2 TABLET BY MOUTH DAILY ADJUST AS DIRECTED 135 tablet 1  . clindamycin (CLEOCIN) 300 MG capsule Take 2 tablets (600 mg total) 30-60 minutes prior to dental or medical procedure. 2 capsule 12   No current facility-administered medications for this visit.    Allergies:   Amlodipine; Tetanus toxoids; Crestor; and Penicillins    Social History:  The patient  reports that he quit smoking about 27 years ago. His smoking use included Cigarettes. He has never used smokeless tobacco. He reports that he does not  drink alcohol or use illicit drugs.   Family History:  The patient's family history includes Cancer in his father.    ROS:  Please see the history of present illness.    General: Alert, oriented x3, no distress Head: no evidence of trauma, PERRL, EOMI, no exophtalmos or lid lag, no myxedema, no xanthelasma; normal ears, nose and oropharynx Neck: normal jugular venous pulsations and no hepatojugular reflux; brisk carotid pulses without delay and no carotid bruits Chest: clear to auscultation, no signs of consolidation by percussion or palpation, normal fremitus, symmetrical and full respiratory excursions Cardiovascular: normal position and quality of the apical impulse, regular rhythm, normal first and second heart sounds, no rubs or gallops. Prosthetic valve clicks are very sharp. Early peaking grade 2/6 systolic ejection murmur. No diastolic murmur Abdomen: no tenderness or distention, no masses by palpation, no abnormal pulsatility or arterial bruits, normal bowel sounds, no hepatosplenomegaly Extremities: no clubbing, cyanosis or edema; 2+ radial, ulnar and brachial pulses bilaterally; 2+ right femoral, posterior tibial and dorsalis pedis pulses; 2+ left femoral, posterior tibial and dorsalis pedis pulses; no subclavian or femoral bruits Neurological: grossly nonfocal All other systems are reviewed and negative.    PHYSICAL EXAM: VS:  BP 112/62 mmHg  Pulse 47  Ht '5\' 11"'$  (1.803 m)  Wt 192 lb 1.6 oz (87.136 kg)  BMI 26.80 kg/m2 , BMI Body mass index is 26.8 kg/(m^2).  General: Alert, oriented x3, no distress Head: no evidence of trauma, PERRL, EOMI, no exophtalmos or lid lag, no myxedema, no xanthelasma; normal ears, nose and oropharynx Neck: normal jugular venous pulsations and no hepatojugular reflux; brisk carotid pulses without delay and no carotid bruits Chest: clear to auscultation, no signs of consolidation by percussion or palpation, normal fremitus, symmetrical and full  respiratory excursions Cardiovascular: normal position and quality of the apical impulse, regular rhythm, normal first and second heart sounds, no rubs or gallops. Prosthetic valve clicks are very sharp. Early peaking grade 2/6 systolic ejection murmur. No diastolic murmur Abdomen: no tenderness or distention, no masses by palpation, no abnormal pulsatility or arterial bruits, normal bowel sounds, no hepatosplenomegaly Extremities: no clubbing, cyanosis or edema; 2+ radial, ulnar and brachial pulses bilaterally; 2+ right femoral, posterior tibial and dorsalis pedis pulses; 2+ left femoral, posterior tibial and dorsalis pedis pulses; no subclavian or femoral bruits Neurological: grossly nonfocal Psych: euthymic mood, full affect   EKG:  EKG is ordered today. The ekg ordered today demonstrates sinus bradycardia, right bundle branch block (QRS 164 ms), QTC 410 ms   Recent Labs: Hemoglobin 12.7, TSH 1.296, normal renal and liver function tests    Lipid Panel Total cholesterol 137, HDL 48, LDL 77, triglycerides 60    Wt Readings from Last 3 Encounters:  08/07/14 192 lb 1.6 oz (87.136 kg)  05/08/13 190 lb (86.183 kg)  12/17/12 188 lb 12.8 oz (85.639 kg)     ASSESSMENT AND PLAN:  1. CAD s/p CABG - relatively recent normal nuclear stress test and excellent functional capacity 2. S/P mechanical AVR - normal exam, probably wise to repeat an echocardiogram every couple of years, also to evaluate the ascending aorta. Reinforced need for endocarditis prophylaxis, particularly when he has his frequent dental procedures (he has a standing prescription for clindamycin). Hopefully, if he needs a colonoscopy, his colonoscopy can again be performed without interruption of warfarin (he will need endocarditis prophylaxis for that procedure as well). 3. S/p thoracic aortic aneurysm repair - his aortic valve was not bicuspid; the entire thoracic and abdominal aorta appears normal in caliber by CT angiography  performed just over a year ago 4. Postoperative atrial fibrillation without recurrence since 2009 5. HTN - good control, continue current medicines. Note intolerance of amlodipine due to disturbed sleep and inability to receive beta blockers due to bradycardia   Current medicines are reviewed at length with the patient today.  The patient does not have concerns regarding medicines.  The following changes have been made:  no change  Labs/ tests ordered today include:  Orders Placed This Encounter  Procedures  . EKG 12-Lead  . Echocardiogram    Patient Instructions  Your physician has requested that you have an echocardiogram. Echocardiography is a painless test that uses sound waves to create images of your heart. It provides your doctor with information about the size and shape of your heart and how well your heart's chambers and valves are working. This procedure takes approximately one hour. There are no restrictions for this procedure.  Dr Sallyanne Kuster has sent Clindamycin 300 mg - take 2 tablets (600 mg total) by mouth prior to dental or medical procedures.  Dr Sallyanne Kuster recommends that you schedule a follow-up appointment in 1 year. You will receive a reminder letter in the mail two months in advance. If you don't receive a letter, please call our office to schedule the follow-up appointment.    Mikael Spray, MD  08/11/2014 4:59 PM    [name], MD, [facility] [phone] office [phone] pager

## 2014-08-10 ENCOUNTER — Telehealth (HOSPITAL_COMMUNITY): Payer: Self-pay | Admitting: *Deleted

## 2014-08-22 LAB — POCT INR: INR: 3.6

## 2014-08-24 ENCOUNTER — Telehealth: Payer: Self-pay | Admitting: *Deleted

## 2014-08-24 NOTE — Telephone Encounter (Signed)
[facility] Prescribed 08/04/14.

## 2014-08-26 ENCOUNTER — Ambulatory Visit (INDEPENDENT_AMBULATORY_CARE_PROVIDER_SITE_OTHER): Payer: Medicare HMO | Admitting: Pharmacist Clinician (PhC)/ Clinical Pharmacy Specialist

## 2014-08-26 DIAGNOSIS — Z954 Presence of other heart-valve replacement: Secondary | ICD-10-CM

## 2014-08-26 DIAGNOSIS — Z952 Presence of prosthetic heart valve: Secondary | ICD-10-CM

## 2014-08-26 DIAGNOSIS — Z7901 Long term (current) use of anticoagulants: Secondary | ICD-10-CM

## 2014-08-28 ENCOUNTER — Ambulatory Visit (HOSPITAL_COMMUNITY)
Admission: RE | Admit: 2014-08-28 | Discharge: 2014-08-28 | Disposition: A | Discharge status: Home / Self Care | Payer: Medicare HMO | Source: Ambulatory Visit | Attending: Cardiology | Admitting: Cardiology

## 2014-08-28 DIAGNOSIS — Z954 Presence of other heart-valve replacement: Secondary | ICD-10-CM

## 2014-08-28 DIAGNOSIS — E785 Hyperlipidemia, unspecified: Secondary | ICD-10-CM | POA: Diagnosis not present

## 2014-08-28 DIAGNOSIS — Z952 Presence of prosthetic heart valve: Secondary | ICD-10-CM

## 2014-08-28 DIAGNOSIS — I10 Essential (primary) hypertension: Secondary | ICD-10-CM | POA: Insufficient documentation

## 2014-08-28 DIAGNOSIS — I359 Nonrheumatic aortic valve disorder, unspecified: Secondary | ICD-10-CM | POA: Insufficient documentation

## 2014-09-16 LAB — PROTIME-INR: INR: 3.8 — AB (ref 0.9–1.1)

## 2014-09-16 LAB — POCT INR: INR: 3.8

## 2014-09-17 ENCOUNTER — Ambulatory Visit (INDEPENDENT_AMBULATORY_CARE_PROVIDER_SITE_OTHER): Payer: Medicare HMO | Admitting: Pharmacist Clinician (PhC)/ Clinical Pharmacy Specialist

## 2014-09-17 DIAGNOSIS — Z952 Presence of prosthetic heart valve: Secondary | ICD-10-CM

## 2014-09-17 DIAGNOSIS — Z7901 Long term (current) use of anticoagulants: Secondary | ICD-10-CM

## 2014-09-17 DIAGNOSIS — Z954 Presence of other heart-valve replacement: Secondary | ICD-10-CM

## 2014-09-28 ENCOUNTER — Other Ambulatory Visit: Payer: Self-pay | Admitting: Cardiovascular Disease

## 2014-09-28 NOTE — Telephone Encounter (Signed)
Rx(s) sent to pharmacy electronically.  

## 2014-10-04 LAB — POCT INR: INR: 2.3

## 2014-10-04 LAB — PROTIME-INR: INR: 2.3 — AB (ref 0.9–1.1)

## 2014-10-06 ENCOUNTER — Ambulatory Visit (INDEPENDENT_AMBULATORY_CARE_PROVIDER_SITE_OTHER): Payer: Medicare HMO | Admitting: Pharmacist Clinician (PhC)/ Clinical Pharmacy Specialist

## 2014-10-06 DIAGNOSIS — Z952 Presence of prosthetic heart valve: Secondary | ICD-10-CM

## 2014-10-06 DIAGNOSIS — Z7901 Long term (current) use of anticoagulants: Secondary | ICD-10-CM

## 2014-10-06 DIAGNOSIS — Z954 Presence of other heart-valve replacement: Secondary | ICD-10-CM

## 2014-10-19 LAB — POCT INR: INR: 3.5

## 2014-10-19 LAB — PROTIME-INR

## 2014-10-21 ENCOUNTER — Ambulatory Visit (INDEPENDENT_AMBULATORY_CARE_PROVIDER_SITE_OTHER): Payer: Medicare HMO | Admitting: Pharmacist Clinician (PhC)/ Clinical Pharmacy Specialist

## 2014-10-21 DIAGNOSIS — Z954 Presence of other heart-valve replacement: Secondary | ICD-10-CM

## 2014-10-21 DIAGNOSIS — Z7901 Long term (current) use of anticoagulants: Secondary | ICD-10-CM

## 2014-10-21 DIAGNOSIS — Z952 Presence of prosthetic heart valve: Secondary | ICD-10-CM

## 2014-11-06 ENCOUNTER — Encounter: Payer: Self-pay | Admitting: Cardiovascular Disease

## 2014-11-15 LAB — POCT INR: INR: 2.3

## 2014-11-16 ENCOUNTER — Ambulatory Visit (INDEPENDENT_AMBULATORY_CARE_PROVIDER_SITE_OTHER): Payer: Medicare HMO | Admitting: Pharmacist Clinician (PhC)/ Clinical Pharmacy Specialist

## 2014-11-16 DIAGNOSIS — Z954 Presence of other heart-valve replacement: Secondary | ICD-10-CM

## 2014-11-16 DIAGNOSIS — Z7901 Long term (current) use of anticoagulants: Secondary | ICD-10-CM

## 2014-11-16 DIAGNOSIS — Z952 Presence of prosthetic heart valve: Secondary | ICD-10-CM

## 2014-12-02 LAB — POCT INR: INR: 3.2

## 2014-12-03 ENCOUNTER — Ambulatory Visit (INDEPENDENT_AMBULATORY_CARE_PROVIDER_SITE_OTHER): Payer: Medicare HMO | Admitting: Pharmacist Clinician (PhC)/ Clinical Pharmacy Specialist

## 2014-12-03 DIAGNOSIS — Z7901 Long term (current) use of anticoagulants: Secondary | ICD-10-CM

## 2014-12-03 DIAGNOSIS — Z954 Presence of other heart-valve replacement: Secondary | ICD-10-CM

## 2014-12-03 DIAGNOSIS — Z952 Presence of prosthetic heart valve: Secondary | ICD-10-CM

## 2014-12-24 ENCOUNTER — Other Ambulatory Visit: Payer: Self-pay | Admitting: Cardiovascular Disease

## 2014-12-25 LAB — POCT INR: INR: 4.1

## 2014-12-29 ENCOUNTER — Ambulatory Visit (INDEPENDENT_AMBULATORY_CARE_PROVIDER_SITE_OTHER): Payer: Medicare HMO | Admitting: Pharmacist Clinician (PhC)/ Clinical Pharmacy Specialist

## 2014-12-29 DIAGNOSIS — Z952 Presence of prosthetic heart valve: Secondary | ICD-10-CM

## 2014-12-29 DIAGNOSIS — Z7901 Long term (current) use of anticoagulants: Secondary | ICD-10-CM

## 2014-12-29 DIAGNOSIS — Z954 Presence of other heart-valve replacement: Secondary | ICD-10-CM

## 2015-01-09 LAB — POCT INR: INR: 3.7

## 2015-01-11 ENCOUNTER — Ambulatory Visit (INDEPENDENT_AMBULATORY_CARE_PROVIDER_SITE_OTHER): Payer: Medicare HMO | Admitting: Pharmacist Clinician (PhC)/ Clinical Pharmacy Specialist

## 2015-01-11 DIAGNOSIS — Z952 Presence of prosthetic heart valve: Secondary | ICD-10-CM

## 2015-01-11 DIAGNOSIS — Z954 Presence of other heart-valve replacement: Secondary | ICD-10-CM

## 2015-01-11 DIAGNOSIS — Z7901 Long term (current) use of anticoagulants: Secondary | ICD-10-CM

## 2015-01-30 LAB — POCT INR: INR: 3.3

## 2015-02-01 ENCOUNTER — Ambulatory Visit (INDEPENDENT_AMBULATORY_CARE_PROVIDER_SITE_OTHER): Payer: Medicare HMO | Admitting: Pharmacist Clinician (PhC)/ Clinical Pharmacy Specialist

## 2015-02-01 DIAGNOSIS — Z952 Presence of prosthetic heart valve: Secondary | ICD-10-CM

## 2015-02-01 DIAGNOSIS — Z954 Presence of other heart-valve replacement: Secondary | ICD-10-CM

## 2015-02-01 DIAGNOSIS — Z7901 Long term (current) use of anticoagulants: Secondary | ICD-10-CM

## 2015-02-26 LAB — POCT INR: INR: 2.5

## 2015-03-02 ENCOUNTER — Ambulatory Visit (INDEPENDENT_AMBULATORY_CARE_PROVIDER_SITE_OTHER): Payer: Medicare HMO | Admitting: Pharmacist Clinician (PhC)/ Clinical Pharmacy Specialist

## 2015-03-02 DIAGNOSIS — Z952 Presence of prosthetic heart valve: Secondary | ICD-10-CM

## 2015-03-02 DIAGNOSIS — Z954 Presence of other heart-valve replacement: Secondary | ICD-10-CM

## 2015-03-02 DIAGNOSIS — Z7901 Long term (current) use of anticoagulants: Secondary | ICD-10-CM

## 2015-03-30 LAB — POCT INR: INR: 2.5

## 2015-03-31 ENCOUNTER — Ambulatory Visit (INDEPENDENT_AMBULATORY_CARE_PROVIDER_SITE_OTHER): Payer: Medicare HMO | Admitting: Pharmacist Clinician (PhC)/ Clinical Pharmacy Specialist

## 2015-03-31 DIAGNOSIS — Z7901 Long term (current) use of anticoagulants: Secondary | ICD-10-CM

## 2015-03-31 DIAGNOSIS — Z952 Presence of prosthetic heart valve: Secondary | ICD-10-CM

## 2015-03-31 DIAGNOSIS — Z954 Presence of other heart-valve replacement: Secondary | ICD-10-CM

## 2015-03-31 LAB — PROTIME-INR

## 2015-04-06 ENCOUNTER — Telehealth: Payer: Self-pay | Admitting: Cardiovascular Disease

## 2015-04-06 DIAGNOSIS — Z8679 Personal history of other diseases of the circulatory system: Secondary | ICD-10-CM

## 2015-04-06 DIAGNOSIS — Z952 Presence of prosthetic heart valve: Secondary | ICD-10-CM

## 2015-04-06 DIAGNOSIS — Z9889 Other specified postprocedural states: Secondary | ICD-10-CM

## 2015-04-06 DIAGNOSIS — Z024 Encounter for examination for driving license: Secondary | ICD-10-CM

## 2015-04-06 NOTE — Telephone Encounter (Signed)
Pt's wife called in wanting to speak with a nurse about the Echo the pt had back on 5/20. Please f/u and assist.  Thanks

## 2015-04-06 NOTE — Telephone Encounter (Signed)
Please order a stress echo by February. I can always just write a letter based on the results of that test, even if his appointment is later in the year. Thanks

## 2015-04-06 NOTE — Telephone Encounter (Signed)
WIFE IS AWARE WILL SCHEDULE  ECHO  TO BE DONE SOMETIME NOW AND BEFORE FEB 2017. ROUTED MESSAGE TO SCHEDULER

## 2015-04-06 NOTE — Telephone Encounter (Signed)
Spoke to patient  ---[phone] Patient state call wife she has information that is needed-----[phone] to wife. She states patient had a D.O.T. Exam. He did pass the  Exam but examiner states he need to have stress echo every five years. Wife was calling to see if what patient had in May 2016 is what  Illene Bolus was talking about. Satira Sark stated patient need one,in feb 2017 at appointment with Dr Sallyanne Kuster. RN informed wife, patient had a regular echo and that is different from stress echo.   wanted to know if patient if this can be order for feb 2017 , but patient is not schedule for recall  Office appointment until April 2017.  will defer to Dr Sallyanne Kuster and contact wife or patient.

## 2015-04-08 ENCOUNTER — Encounter: Payer: Self-pay | Admitting: Cardiovascular Disease

## 2015-04-20 ENCOUNTER — Telehealth: Payer: Self-pay | Admitting: Cardiovascular Disease

## 2015-04-20 NOTE — Telephone Encounter (Signed)
Please call,she asked for you. She said you were helping them to get Marcus [name] scheduled for a Stress Echo.

## 2015-04-21 NOTE — Telephone Encounter (Signed)
Spoke to wife  She states they have not heard form office about a schedule appointment.  RN informed wife , scheduler will call them today to schedule appointment- stress echo. verbalized understanding.

## 2015-04-25 LAB — POCT INR: INR: 3.7

## 2015-04-26 ENCOUNTER — Ambulatory Visit (INDEPENDENT_AMBULATORY_CARE_PROVIDER_SITE_OTHER): Payer: Medicare HMO | Admitting: Pharmacist Clinician (PhC)/ Clinical Pharmacy Specialist

## 2015-04-26 DIAGNOSIS — Z954 Presence of other heart-valve replacement: Secondary | ICD-10-CM

## 2015-04-26 DIAGNOSIS — Z952 Presence of prosthetic heart valve: Secondary | ICD-10-CM

## 2015-04-26 DIAGNOSIS — Z7901 Long term (current) use of anticoagulants: Secondary | ICD-10-CM

## 2015-05-10 ENCOUNTER — Ambulatory Visit (HOSPITAL_BASED_OUTPATIENT_CLINIC_OR_DEPARTMENT_OTHER): Payer: Medicare HMO

## 2015-05-10 ENCOUNTER — Ambulatory Visit (HOSPITAL_COMMUNITY): Payer: Medicare HMO | Attending: Internal Medicine

## 2015-05-10 DIAGNOSIS — Z8679 Personal history of other diseases of the circulatory system: Secondary | ICD-10-CM

## 2015-05-10 DIAGNOSIS — R0989 Other specified symptoms and signs involving the circulatory and respiratory systems: Secondary | ICD-10-CM

## 2015-05-10 DIAGNOSIS — I359 Nonrheumatic aortic valve disorder, unspecified: Secondary | ICD-10-CM | POA: Diagnosis present

## 2015-05-10 DIAGNOSIS — Z954 Presence of other heart-valve replacement: Secondary | ICD-10-CM

## 2015-05-10 DIAGNOSIS — I259 Chronic ischemic heart disease, unspecified: Secondary | ICD-10-CM | POA: Diagnosis not present

## 2015-05-10 DIAGNOSIS — Z024 Encounter for examination for driving license: Secondary | ICD-10-CM

## 2015-05-10 DIAGNOSIS — Z029 Encounter for administrative examinations, unspecified: Secondary | ICD-10-CM | POA: Diagnosis not present

## 2015-05-10 DIAGNOSIS — Z952 Presence of prosthetic heart valve: Secondary | ICD-10-CM

## 2015-05-10 DIAGNOSIS — Z9889 Other specified postprocedural states: Secondary | ICD-10-CM | POA: Diagnosis not present

## 2015-05-10 DIAGNOSIS — I25810 Atherosclerosis of coronary artery bypass graft(s) without angina pectoris: Secondary | ICD-10-CM | POA: Diagnosis not present

## 2015-05-12 LAB — ECHOCARDIOGRAM STRESS TEST
Estimated workload: 10.9 METS
Peak HR: 153 {beats}/min
Percent of predicted max HR: 102 %
Stage 1 DBP: 95 mmHg
Stage 1 Grade: 0 %
Stage 1 HR: 51 {beats}/min
Stage 1 SBP: 143 mmHg
Stage 1 Speed: 0 mph
Stage 2 DBP: 90 mmHg
Stage 2 Grade: 0 %
Stage 2 HR: 65 {beats}/min
Stage 2 SBP: 141 mmHg
Stage 2 Speed: 0 mph
Stage 3 Grade: 0 %
Stage 3 HR: 66 {beats}/min
Stage 3 Speed: 0 mph
Stage 4 DBP: 67 mmHg
Stage 4 Grade: 10 %
Stage 4 HR: 98 {beats}/min
Stage 4 SBP: 153 mmHg
Stage 4 Speed: 1.7 mph
Stage 5 DBP: 74 mmHg
Stage 5 Grade: 12 %
Stage 5 HR: 129 {beats}/min
Stage 5 SBP: 146 mmHg
Stage 5 Speed: 2.5 mph
Stage 6 DBP: 74 mmHg
Stage 6 Grade: 14 %
Stage 6 HR: 148 {beats}/min
Stage 6 SBP: 140 mmHg
Stage 6 Speed: 3.4 mph
Stage 7 Grade: 16 %
Stage 7 HR: 153 {beats}/min
Stage 7 Speed: 4.2 mph
Stage 8 DBP: 55 mmHg
Stage 8 Grade: 0 %
Stage 8 HR: 122 {beats}/min
Stage 8 SBP: 129 mmHg
Stage 8 Speed: 0 mph
Stage 9 DBP: 83 mmHg
Stage 9 Grade: 0 %
Stage 9 HR: 81 {beats}/min
Stage 9 SBP: 157 mmHg
Stage 9 Speed: 0 mph

## 2015-05-24 ENCOUNTER — Other Ambulatory Visit: Payer: Self-pay | Admitting: Cardiovascular Disease

## 2015-05-28 ENCOUNTER — Other Ambulatory Visit: Payer: Self-pay | Admitting: *Deleted

## 2015-05-28 MED ORDER — ATORVASTATIN CALCIUM 80 MG PO TABS: 80.0000 mg | ORAL_TABLET | Freq: Every day | ORAL | Status: DC

## 2015-05-28 NOTE — Telephone Encounter (Signed)
called pharm and the medications are there, they are insurance rejects, so i will inform pt and let her contact insurance to work this out.  contacted pt, informed him of above, he expressed understanding.

## 2015-06-02 LAB — POCT INR: INR: 3.4

## 2015-06-03 ENCOUNTER — Ambulatory Visit (INDEPENDENT_AMBULATORY_CARE_PROVIDER_SITE_OTHER): Payer: Medicare HMO | Admitting: Pharmacist Clinician (PhC)/ Clinical Pharmacy Specialist

## 2015-06-03 DIAGNOSIS — Z7901 Long term (current) use of anticoagulants: Secondary | ICD-10-CM

## 2015-06-03 DIAGNOSIS — Z954 Presence of other heart-valve replacement: Secondary | ICD-10-CM

## 2015-06-03 DIAGNOSIS — Z952 Presence of prosthetic heart valve: Secondary | ICD-10-CM

## 2015-06-19 LAB — POCT INR: INR: 3.2

## 2015-06-28 ENCOUNTER — Ambulatory Visit (INDEPENDENT_AMBULATORY_CARE_PROVIDER_SITE_OTHER): Payer: Medicare HMO | Admitting: Pharmacist Clinician (PhC)/ Clinical Pharmacy Specialist

## 2015-06-28 DIAGNOSIS — Z954 Presence of other heart-valve replacement: Secondary | ICD-10-CM

## 2015-06-28 DIAGNOSIS — Z7901 Long term (current) use of anticoagulants: Secondary | ICD-10-CM

## 2015-06-28 DIAGNOSIS — Z952 Presence of prosthetic heart valve: Secondary | ICD-10-CM

## 2015-07-15 ENCOUNTER — Ambulatory Visit (INDEPENDENT_AMBULATORY_CARE_PROVIDER_SITE_OTHER): Payer: Medicare HMO | Admitting: Cardiovascular Disease

## 2015-07-15 ENCOUNTER — Encounter: Payer: Self-pay | Admitting: Cardiovascular Disease

## 2015-07-15 VITALS — BP 120/72 | HR 44 | Ht 71.0 in | Wt 192.2 lb

## 2015-07-15 DIAGNOSIS — R001 Bradycardia, unspecified: Secondary | ICD-10-CM

## 2015-07-15 DIAGNOSIS — Z7901 Long term (current) use of anticoagulants: Secondary | ICD-10-CM

## 2015-07-15 DIAGNOSIS — Z9889 Other specified postprocedural states: Secondary | ICD-10-CM

## 2015-07-15 DIAGNOSIS — E785 Hyperlipidemia, unspecified: Secondary | ICD-10-CM

## 2015-07-15 DIAGNOSIS — I9789 Other postprocedural complications and disorders of the circulatory system, not elsewhere classified: Secondary | ICD-10-CM

## 2015-07-15 DIAGNOSIS — Z954 Presence of other heart-valve replacement: Secondary | ICD-10-CM

## 2015-07-15 DIAGNOSIS — Z952 Presence of prosthetic heart valve: Secondary | ICD-10-CM

## 2015-07-15 DIAGNOSIS — I25810 Atherosclerosis of coronary artery bypass graft(s) without angina pectoris: Secondary | ICD-10-CM | POA: Diagnosis not present

## 2015-07-15 DIAGNOSIS — Z79899 Other long term (current) drug therapy: Secondary | ICD-10-CM

## 2015-07-15 DIAGNOSIS — G4733 Obstructive sleep apnea (adult) (pediatric): Secondary | ICD-10-CM

## 2015-07-15 DIAGNOSIS — I4891 Unspecified atrial fibrillation: Secondary | ICD-10-CM

## 2015-07-15 DIAGNOSIS — I10 Essential (primary) hypertension: Secondary | ICD-10-CM | POA: Diagnosis not present

## 2015-07-15 DIAGNOSIS — Z8679 Personal history of other diseases of the circulatory system: Secondary | ICD-10-CM

## 2015-07-15 NOTE — Patient Instructions (Signed)
Dr Sallyanne Kuster recommends that you continue on your current medications as directed. Please refer to the Current Medication list given to you today.  Your physician recommends that you return for lab work at your convenience - FASTING.  Dr Sallyanne Kuster recommends that you schedule a follow-up appointment in 1 year. You will receive a reminder letter in the mail two months in advance. If you don't receive a letter, please call our office to schedule the follow-up appointment.  If you need a refill on your cardiac medications before your next appointment, please call your pharmacy.

## 2015-07-15 NOTE — Progress Notes (Signed)
Patient ID: Marcus [name], male   DOB: [birth year], 70 y.o.   MRN: [id number]    Cardiology Office Note    Date:  07/15/2015   ID:  BRIGGS EDELEN, DOB 1945-09-04, MRN [id number]  PCP:  Norberto Sorenson, MD  Cardiologist:   [name], MD   Chief Complaint  Patient presents with  . Annual Exam    no chest pain, no shortness of breath, no edema,no pain or cramping in legs, no lightheaded or dizziness, fatigue    History of Present Illness:  Marcus [name] is a 70 y.o. male who presents for follow-up of aortic valve mechanical prosthesis, previous repair of ascending aortic aneurysm, severe asymptomatic sinus bradycardia and coronary artery disease with previous bypass surgery (Severe AI - Bentall 23 mm [employer]. Jude AVR, CABG x 2 - SVG-RCA and SVG-OM 2008, [place]).  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 He denies dizziness, syncope, chest pain, exertional dyspnea. He reports 100% compliance with CPAP for sleep apnea. He has not had palpitations or focal neurological events and denies bleeding complications. He monitors warfarin anticoagulation with a home monitor, supervised by our Coumadin clinic.  He had a normal stress echo just a few weeks ago. CT angiogram of the aorta and March 2015 was normal. He has normal left ventricular systolic function and normal gradients across his aortic valve prosthesis.  He is scheduled for a physical and lab tests this week with Dr. Merlyn Lot.  Past Medical History  Diagnosis Date  . Hypertension   . Atrial fibrillation ([place])     ablation  . Obstructive sleep apnea 10/25/2007    AHI-0.71/hr  . Swelling of limb     LEA VENOUS, 08/22/2009 - no evidence of deep vein or superficial thrombosis; partially rupturing Baker's Cyst  . S/P AVR     2D ECHO, 04/25/2011 - EF >55%, Right ventricle-mild-moderately dilated    Past Surgical History  Procedure Laterality Date  . Cardiac surgery    . Cardioversion  08/02/2007    150 Joule  biphasic shock with restoration of sinus rhythm. Heart rate 60.  . Cardiac catheterization Bilateral 05/10/2007    Significant 1-vessel disease, severely dilated aortic root with moderate severe aortic insufficiency  . Nm myoview ltd  04/08/2007    No evidence of inducible myocardial ischemia    Current Medications: Outpatient Prescriptions Prior to Visit  Medication Sig Dispense Refill  . Ascorbic Acid (VITAMIN C) 1000 MG tablet Take 1,000 mg by mouth daily.    Marland Kitchen aspirin EC 81 MG tablet Take 81 mg by mouth daily.    Marland Kitchen atorvastatin (LIPITOR) 80 MG tablet Take 1 tablet (80 mg total) by mouth daily. 90 tablet 0  . Calcium Carb-Cholecalciferol (CALCIUM 1000 + D PO) Take 1,200 mg by mouth daily.     . cetirizine (ZYRTEC) 10 MG tablet Take 10 mg by mouth daily.    . Cholecalciferol (VITAMIN D) 2000 UNITS CAPS Take 1 capsule by mouth daily.    Marland Kitchen CINNAMON PO Take 1 tablet by mouth daily.    . clindamycin (CLEOCIN) 300 MG capsule Take 2 tablets (600 mg total) 30-60 minutes prior to dental or medical procedure. 2 capsule 12  . Multiple Vitamins-Minerals (MULTIVITAMIN WITH MINERALS) tablet Take 1 tablet by mouth daily.    . Omega-3 Krill Oil 500 MG CAPS Take 1 capsule by mouth daily.    . ramipril (ALTACE) 10 MG capsule TAKE 1 CAPSULE BY MOUTH EVERY DAY 90 capsule 0  .  vitamin B-12 (CYANOCOBALAMIN) 1000 MCG tablet Take 1,000 mcg by mouth daily.    Marland Kitchen warfarin (COUMADIN) 10 MG tablet TAKE 1 AND 1/2 TABLET BY MOUTH DAILY AND ADJUST DOSE AS DIRECTED 135 tablet 0  . fluticasone (VERAMYST) 27.5 MCG/SPRAY nasal spray Place 2 sprays into the nose as needed for allergies. Reported on 07/15/2015     No facility-administered medications prior to visit.     Allergies:   Amlodipine; Tetanus toxoids; Crestor; and Penicillins   Social History   Social History  . Marital Status: Married    Spouse Name: N/A  . Number of Children: N/A  . Years of Education: N/A   Social History Main Topics  . Smoking status:  Former Smoker    Types: Cigarettes    Quit date: 04/11/1987  . Smokeless tobacco: Never Used  . Alcohol Use: No  . Drug Use: No  . Sexual Activity: Not Asked   Other Topics Concern  . None   Social History Narrative     Family History:  The patient's family history includes Cancer in his father.   ROS:   Please see the history of present illness.    ROS All other systems reviewed and are negative.   PHYSICAL EXAM:   VS:  BP 120/72 mmHg  Pulse 44  Ht '5\' 11"'$  (1.803 m)  Wt 87.204 kg (192 lb 4 oz)  BMI 26.83 kg/m2   GEN: Well nourished, well developed, in no acute distress HEENT: normal Neck: no JVD, carotid bruits, or masses Cardiac: Cardiac cardia, crisp prosthetic valve clicks, RRR; no murmurs, rubs, or gallops,no edema  Respiratory:  clear to auscultation bilaterally, normal work of breathing GI: soft, nontender, nondistended, + BS MS: no deformity or atrophy Skin: warm and dry, no rash Neuro:  Alert and Oriented x 3, Strength and sensation are intact Psych: euthymic mood, full affect  Wt Readings from Last 3 Encounters:  07/15/15 87.204 kg (192 lb 4 oz)  08/07/14 87.136 kg (192 lb 1.6 oz)  05/08/13 86.183 kg (190 lb)      Studies/Labs Reviewed:   EKG:  EKG is ordered today.  The ekg ordered today demonstrates Marked sinus bradycardia, right bundle branch block (old)  Recent Labs: No results found for requested labs within last 365 days.   Lipid Panel No results found for: CHOL, TRIG, HDL, CHOLHDL, VLDL, LDLCALC, LDLDIRECT   ASSESSMENT:    1. Coronary artery disease involving coronary bypass graft of native heart without angina pectoris   2. HTN, goal below 130/80   3. Dyslipidemia   4. Bradycardia, severe sinus   5. Postoperative atrial fibrillation (HCC)   6. OSA (obstructive sleep apnea)   7. S/P thoracic aortic aneurysm repair   8. H/O aortic valve replacement   9. Long term current use of anticoagulant therapy   10. Medication management       PLAN:  In order of problems listed above:  1. CAD s/p CABG: Preserved left ventricular systolic function, no angina pectoris, risk factors generally well addressed. 2. HTN: Excellent control 3. HLP: Repeat lipids later this week 4. He has had severe sinus bradycardia for years, never symptomatic 5. Postop AF: Required cardioversion in 2009 without any clinically evident recurrence since that time. He is on anticoagulation anyway for his prostatic aortic valve 6. OSA: Reports 100% compliance with CPAP 7. Ao aneurysm s/p Bentall repair: No evidence of other areas of aortic dilation on recent CT angiogram. At this point plan to repeat once every  5 years or so. 8. Mechanical AVR: Normal function of aortic valve prosthesis by exam and by recent echocardiography 9. Warfarin: No bleeding complications, no embolic events    Medication Adjustments/Labs and Tests Ordered: Current medicines are reviewed at length with the patient today.  Concerns regarding medicines are outlined above.  Medication changes, Labs and Tests ordered today are listed in the Patient Instructions below. Patient Instructions  Dr Sallyanne Kuster recommends that you continue on your current medications as directed. Please refer to the Current Medication list given to you today.  Your physician recommends that you return for lab work at your convenience - FASTING.  Dr Sallyanne Kuster recommends that you schedule a follow-up appointment in 1 year. You will receive a reminder letter in the mail two months in advance. If you don't receive a letter, please call our office to schedule the follow-up appointment.  If you need a refill on your cardiac medications before your next appointment, please call your pharmacy.    Mikael Spray, MD  07/15/2015 1:43 PM    [facility] [place], [place], [place]  [address] Phone: [phone]; Fax: [phone]  \

## 2015-07-18 LAB — POCT INR: INR: 3.3

## 2015-07-20 ENCOUNTER — Ambulatory Visit (INDEPENDENT_AMBULATORY_CARE_PROVIDER_SITE_OTHER): Payer: Medicare HMO | Admitting: Pharmacist Clinician (PhC)/ Clinical Pharmacy Specialist

## 2015-07-20 DIAGNOSIS — Z7901 Long term (current) use of anticoagulants: Secondary | ICD-10-CM

## 2015-07-20 DIAGNOSIS — Z954 Presence of other heart-valve replacement: Secondary | ICD-10-CM | POA: Diagnosis not present

## 2015-07-20 DIAGNOSIS — Z952 Presence of prosthetic heart valve: Secondary | ICD-10-CM

## 2015-08-03 ENCOUNTER — Encounter: Payer: Self-pay | Admitting: Pulmonary Disease

## 2015-08-03 ENCOUNTER — Ambulatory Visit (INDEPENDENT_AMBULATORY_CARE_PROVIDER_SITE_OTHER): Payer: Medicare HMO | Admitting: Pulmonary Disease

## 2015-08-03 VITALS — BP 122/82 | HR 59 | Ht 71.0 in | Wt 192.4 lb

## 2015-08-03 DIAGNOSIS — G4733 Obstructive sleep apnea (adult) (pediatric): Secondary | ICD-10-CM | POA: Diagnosis not present

## 2015-08-03 NOTE — Assessment & Plan Note (Signed)
BiPAP download report will be reviewed Home sleep study will be scheduled  - based on this we will advise you whether you still need the BiPAP   Weight loss encouraged, compliance with goal of at least 4-6 hrs every night is the expectation. Advised against medications with sedative side effects Cautioned against driving when sleepy - understanding that sleepiness will vary on a day to day basis

## 2015-08-03 NOTE — Patient Instructions (Signed)
BiPAP download report will be reviewed Home sleep study will be scheduled  - based on this we will advise you whether you still need the BiPAP

## 2015-08-03 NOTE — Progress Notes (Signed)
Subjective:    Patient ID: Marcus [name], male    DOB: June 08, 1945, 71 y.o.   MRN: [id number]  HPI  Chief Complaint  Patient presents with  . Sleep Referral    Self Referral.  wears CPAP every night.  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 His wife also has sleep apnea and follows with [place] PSG 2009 showed an AHI of 26/hour (Dr Claiborne Billings) - 191 lbs. This was performed after he had CABG and aVR. Follow-up CPAP titration showed increased presence of centrals, hence he was placed on auto BiPAP-he has had his current machine for at least 4 years He reports good compliance with his machine with improvement in his daytime somnolence and fatigue He still drives for long distances up to [place] and denies any problems driving  Epworth sleepiness score is 2 Bedtime is around 10 PM, sleep latency is minimal he sleeps on his side with one pillow reports minimal nocturnal awakenings or denies nocturia. He is out of bed at 7:30 AM feeling rested without dryness of mouth or headaches. On his workdays he is up as early as 3 AM. He is able to use his BiPAP on the truck  There is no history suggestive of cataplexy, sleep paralysis or parasomnias  [facility]  His weight is unchanged at 192 pounds for the last 6 years     Past Medical History  Diagnosis Date  . Hypertension   . Atrial fibrillation ([place])     ablation  . Obstructive sleep apnea 10/25/2007    AHI-0.71/hr  . Swelling of limb     LEA VENOUS, 08/22/2009 - no evidence of deep vein or superficial thrombosis; partially rupturing Baker's Cyst  . S/P AVR     2D ECHO, 04/25/2011 - EF >55%, Right ventricle-mild-moderately dilated    Past Surgical History  Procedure Laterality Date  . Cardiac surgery    . Cardioversion  08/02/2007    150 Joule biphasic shock with restoration of sinus rhythm. Heart rate 60.  . Cardiac catheterization Bilateral 05/10/2007   Significant 1-vessel disease, severely dilated aortic root with moderate severe aortic insufficiency  . Nm myoview ltd  04/08/2007    No evidence of inducible myocardial ischemia     Allergies  Allergen Reactions  . Amlodipine Anaphylaxis    Weakness and fatigue   . Tetanus Toxoids Anaphylaxis  . Crestor [Rosuvastatin]   . Penicillins     Hives    Social History   Social History  . Marital Status: Married    Spouse Name: N/A  . Number of Children: N/A  . Years of Education: N/A   Occupational History  . Not on file.   Social History Main Topics  . Smoking status: Former Smoker    Types: Cigarettes    Quit date: 04/11/1987  . Smokeless tobacco: Never Used  . Alcohol Use: No  . Drug Use: No  . Sexual Activity: Not on file   Other Topics Concern  . Not on file   Social History Narrative     Family History  Problem Relation Age of Onset  . Cancer Father       Review of Systems neg for any significant sore throat, dysphagia, itching, sneezing, nasal congestion or excess/ purulent secretions, fever, chills, sweats, unintended wt loss, pleuritic or exertional cp, hempoptysis, orthopnea pnd or change in chronic leg swelling. Also denies presyncope, palpitations, heartburn,  abdominal pain, nausea, vomiting, diarrhea or change in bowel or urinary habits, dysuria,hematuria, rash, arthralgias, visual complaints, headache, numbness weakness or ataxia.     Objective:   Physical Exam  Gen. Pleasant, well-nourished, in no distress ENT - no lesions, no post nasal drip Neck: No JVD, no thyromegaly, no carotid bruits Lungs: no use of accessory muscles, no dullness to percussion, clear without rales or rhonchi  Cardiovascular: Rhythm regular, heart sounds  normal, no murmurs or gallops, no peripheral edema Musculoskeletal: No deformities, no cyanosis or clubbing        Assessment & Plan:

## 2015-08-05 ENCOUNTER — Telehealth: Payer: Self-pay | Admitting: Pulmonary Disease

## 2015-08-05 NOTE — Telephone Encounter (Signed)
Per Dr. Elsworth Soho: Compliance report shows on auto BiPAP - average pressure 16/13, no residuals, good seal, settings are fine.  --------------------------------------------  Patient's wife notified of results (ok per DPR) Nothing further needed.

## 2015-08-10 ENCOUNTER — Encounter: Payer: Self-pay | Admitting: Pulmonary Disease

## 2015-08-16 LAB — POCT INR: INR: 3.1

## 2015-08-17 ENCOUNTER — Ambulatory Visit (INDEPENDENT_AMBULATORY_CARE_PROVIDER_SITE_OTHER): Payer: Medicare HMO | Admitting: Pharmacist

## 2015-08-17 ENCOUNTER — Encounter: Payer: Self-pay | Admitting: Cardiovascular Disease

## 2015-08-17 DIAGNOSIS — Z952 Presence of prosthetic heart valve: Secondary | ICD-10-CM

## 2015-08-17 DIAGNOSIS — Z7901 Long term (current) use of anticoagulants: Secondary | ICD-10-CM

## 2015-08-17 DIAGNOSIS — Z954 Presence of other heart-valve replacement: Secondary | ICD-10-CM

## 2015-08-26 DIAGNOSIS — G4733 Obstructive sleep apnea (adult) (pediatric): Secondary | ICD-10-CM | POA: Diagnosis not present

## 2015-08-31 ENCOUNTER — Other Ambulatory Visit: Payer: Self-pay | Admitting: Cardiovascular Disease

## 2015-08-31 NOTE — Telephone Encounter (Signed)
Rx(s) sent to pharmacy electronically.  

## 2015-09-03 ENCOUNTER — Telehealth: Payer: Self-pay | Admitting: Pulmonary Disease

## 2015-09-03 DIAGNOSIS — G4733 Obstructive sleep apnea (adult) (pediatric): Secondary | ICD-10-CM | POA: Diagnosis not present

## 2015-09-03 NOTE — Telephone Encounter (Signed)
Per Dr. Elsworth Soho  Pt has mild sleep apnea, especially supine - stay on BiPAP -----------------------------------

## 2015-09-03 NOTE — Telephone Encounter (Signed)
Patient's wife notified of Dr. Bari Mantis recommendations. Nothing further needed.

## 2015-09-07 ENCOUNTER — Other Ambulatory Visit: Payer: Self-pay | Admitting: *Deleted

## 2015-09-07 DIAGNOSIS — G4733 Obstructive sleep apnea (adult) (pediatric): Secondary | ICD-10-CM

## 2015-09-18 LAB — POCT INR: INR: 4.1

## 2015-09-20 ENCOUNTER — Ambulatory Visit (INDEPENDENT_AMBULATORY_CARE_PROVIDER_SITE_OTHER): Payer: Medicare HMO | Admitting: Pharmacist

## 2015-09-20 DIAGNOSIS — Z7901 Long term (current) use of anticoagulants: Secondary | ICD-10-CM

## 2015-09-20 DIAGNOSIS — Z954 Presence of other heart-valve replacement: Secondary | ICD-10-CM

## 2015-09-20 DIAGNOSIS — Z952 Presence of prosthetic heart valve: Secondary | ICD-10-CM

## 2015-10-19 ENCOUNTER — Ambulatory Visit (INDEPENDENT_AMBULATORY_CARE_PROVIDER_SITE_OTHER): Payer: Medicare HMO | Admitting: Pharmacist

## 2015-10-19 DIAGNOSIS — Z952 Presence of prosthetic heart valve: Secondary | ICD-10-CM

## 2015-10-19 DIAGNOSIS — Z954 Presence of other heart-valve replacement: Secondary | ICD-10-CM

## 2015-10-19 DIAGNOSIS — Z7901 Long term (current) use of anticoagulants: Secondary | ICD-10-CM

## 2015-10-19 LAB — POCT INR: INR: 3.7

## 2015-11-02 ENCOUNTER — Ambulatory Visit: Payer: Medicare HMO | Admitting: Pulmonary Disease

## 2015-11-03 ENCOUNTER — Other Ambulatory Visit: Payer: Self-pay | Admitting: Cardiovascular Disease

## 2015-11-10 LAB — POCT INR: INR: 3.5

## 2015-11-11 ENCOUNTER — Ambulatory Visit (INDEPENDENT_AMBULATORY_CARE_PROVIDER_SITE_OTHER): Payer: Medicare HMO | Admitting: Pharmacist Clinician (PhC)/ Clinical Pharmacy Specialist

## 2015-11-11 DIAGNOSIS — Z7901 Long term (current) use of anticoagulants: Secondary | ICD-10-CM

## 2015-11-11 DIAGNOSIS — Z954 Presence of other heart-valve replacement: Secondary | ICD-10-CM

## 2015-11-11 DIAGNOSIS — Z952 Presence of prosthetic heart valve: Secondary | ICD-10-CM

## 2015-12-01 ENCOUNTER — Other Ambulatory Visit: Payer: Self-pay | Admitting: Cardiovascular Disease

## 2015-12-12 LAB — POCT INR: INR: 3.3

## 2015-12-14 ENCOUNTER — Ambulatory Visit (INDEPENDENT_AMBULATORY_CARE_PROVIDER_SITE_OTHER): Payer: Medicare HMO | Admitting: Pharmacist

## 2015-12-14 DIAGNOSIS — Z7901 Long term (current) use of anticoagulants: Secondary | ICD-10-CM

## 2015-12-14 DIAGNOSIS — Z952 Presence of prosthetic heart valve: Secondary | ICD-10-CM

## 2015-12-14 DIAGNOSIS — Z954 Presence of other heart-valve replacement: Secondary | ICD-10-CM

## 2016-01-09 LAB — POCT INR: INR: 4.6

## 2016-01-10 ENCOUNTER — Ambulatory Visit (INDEPENDENT_AMBULATORY_CARE_PROVIDER_SITE_OTHER): Payer: Medicare HMO | Admitting: Pharmacist Clinician (PhC)/ Clinical Pharmacy Specialist

## 2016-01-10 ENCOUNTER — Telehealth: Payer: Self-pay | Admitting: Cardiovascular Disease

## 2016-01-10 DIAGNOSIS — Z952 Presence of prosthetic heart valve: Secondary | ICD-10-CM

## 2016-01-10 DIAGNOSIS — Z7901 Long term (current) use of anticoagulants: Secondary | ICD-10-CM

## 2016-01-10 NOTE — Telephone Encounter (Signed)
New message   Kathlee Nations is calling to verify if [facility] has received the fax that was sent to number [phone]   She wants rn to call pt

## 2016-01-10 NOTE — Telephone Encounter (Signed)
See anticoag note

## 2016-01-10 NOTE — Telephone Encounter (Signed)
No fax received - I called [facility] and verified report from telephone clinician - Patient had a result of 4.6 INR on oct 1st. They are re-sending fax for purposes of medical record. Will route to pharmD for dosing recommendations.

## 2016-01-20 ENCOUNTER — Encounter: Payer: Self-pay | Admitting: Cardiovascular Disease

## 2016-01-20 LAB — POCT INR: INR: 2.6

## 2016-01-21 ENCOUNTER — Ambulatory Visit (INDEPENDENT_AMBULATORY_CARE_PROVIDER_SITE_OTHER): Payer: Medicare HMO | Admitting: Pharmacist Clinician (PhC)/ Clinical Pharmacy Specialist

## 2016-01-21 DIAGNOSIS — Z7901 Long term (current) use of anticoagulants: Secondary | ICD-10-CM

## 2016-01-21 DIAGNOSIS — Z952 Presence of prosthetic heart valve: Secondary | ICD-10-CM

## 2016-02-05 LAB — PROTIME-INR: INR: 3.7 — AB (ref 0.9–1.1)

## 2016-02-07 ENCOUNTER — Ambulatory Visit (INDEPENDENT_AMBULATORY_CARE_PROVIDER_SITE_OTHER): Payer: Medicare HMO | Admitting: Pharmacist Clinician (PhC)/ Clinical Pharmacy Specialist

## 2016-02-07 DIAGNOSIS — Z952 Presence of prosthetic heart valve: Secondary | ICD-10-CM

## 2016-02-07 DIAGNOSIS — Z7901 Long term (current) use of anticoagulants: Secondary | ICD-10-CM

## 2016-02-26 LAB — POCT INR: INR: 4

## 2016-02-28 ENCOUNTER — Ambulatory Visit (INDEPENDENT_AMBULATORY_CARE_PROVIDER_SITE_OTHER): Payer: Medicare HMO | Admitting: Pharmacist

## 2016-02-28 DIAGNOSIS — Z7901 Long term (current) use of anticoagulants: Secondary | ICD-10-CM

## 2016-02-28 DIAGNOSIS — Z952 Presence of prosthetic heart valve: Secondary | ICD-10-CM

## 2016-03-04 ENCOUNTER — Other Ambulatory Visit: Payer: Self-pay | Admitting: Cardiovascular Disease

## 2016-03-12 LAB — POCT INR: INR: 2.1

## 2016-03-13 ENCOUNTER — Ambulatory Visit (INDEPENDENT_AMBULATORY_CARE_PROVIDER_SITE_OTHER): Payer: Medicare HMO | Admitting: Pharmacist Clinician (PhC)/ Clinical Pharmacy Specialist

## 2016-03-13 DIAGNOSIS — Z7901 Long term (current) use of anticoagulants: Secondary | ICD-10-CM

## 2016-03-13 DIAGNOSIS — Z952 Presence of prosthetic heart valve: Secondary | ICD-10-CM

## 2016-03-20 ENCOUNTER — Ambulatory Visit (INDEPENDENT_AMBULATORY_CARE_PROVIDER_SITE_OTHER): Payer: Medicare HMO | Admitting: Pharmacist Clinician (PhC)/ Clinical Pharmacy Specialist

## 2016-03-20 ENCOUNTER — Telehealth: Payer: Self-pay | Admitting: Cardiovascular Disease

## 2016-03-20 DIAGNOSIS — Z952 Presence of prosthetic heart valve: Secondary | ICD-10-CM

## 2016-03-20 DIAGNOSIS — Z7901 Long term (current) use of anticoagulants: Secondary | ICD-10-CM

## 2016-03-20 LAB — POCT INR: INR: 2.5

## 2016-03-20 NOTE — Telephone Encounter (Signed)
See anticoag note

## 2016-03-20 NOTE — Telephone Encounter (Signed)
Pt's wife calling regarding pt's INR-pls call

## 2016-03-22 DIAGNOSIS — G4733 Obstructive sleep apnea (adult) (pediatric): Secondary | ICD-10-CM | POA: Diagnosis not present

## 2016-03-27 LAB — POCT INR: INR: 3.1

## 2016-03-28 ENCOUNTER — Ambulatory Visit (INDEPENDENT_AMBULATORY_CARE_PROVIDER_SITE_OTHER): Payer: Medicare HMO | Admitting: Pharmacist

## 2016-03-28 DIAGNOSIS — Z952 Presence of prosthetic heart valve: Secondary | ICD-10-CM

## 2016-03-28 DIAGNOSIS — Z7901 Long term (current) use of anticoagulants: Secondary | ICD-10-CM

## 2016-04-12 LAB — PROTIME-INR

## 2016-04-12 LAB — POCT INR: INR: 3.3

## 2016-04-13 ENCOUNTER — Ambulatory Visit (INDEPENDENT_AMBULATORY_CARE_PROVIDER_SITE_OTHER): Payer: Medicare HMO | Admitting: Pharmacist Clinician (PhC)/ Clinical Pharmacy Specialist

## 2016-04-13 DIAGNOSIS — Z7901 Long term (current) use of anticoagulants: Secondary | ICD-10-CM

## 2016-04-13 DIAGNOSIS — Z952 Presence of prosthetic heart valve: Secondary | ICD-10-CM

## 2016-04-19 DIAGNOSIS — R69 Illness, unspecified: Secondary | ICD-10-CM | POA: Diagnosis not present

## 2016-04-26 LAB — POCT INR: INR: 3.6

## 2016-04-28 ENCOUNTER — Ambulatory Visit (INDEPENDENT_AMBULATORY_CARE_PROVIDER_SITE_OTHER): Payer: Medicare HMO | Admitting: Pharmacist Clinician (PhC)/ Clinical Pharmacy Specialist

## 2016-04-28 DIAGNOSIS — Z952 Presence of prosthetic heart valve: Secondary | ICD-10-CM

## 2016-04-28 DIAGNOSIS — Z7901 Long term (current) use of anticoagulants: Secondary | ICD-10-CM

## 2016-05-10 ENCOUNTER — Telehealth: Payer: Self-pay | Admitting: Cardiovascular Disease

## 2016-05-10 LAB — POCT INR: INR: 3.9

## 2016-05-10 MED ORDER — CLINDAMYCIN HCL 300 MG PO CAPS: ORAL_CAPSULE | ORAL | 5 refills | Status: DC

## 2016-05-10 NOTE — Telephone Encounter (Signed)
Thanks, Ovid Curd, clindamycin it is (hives with penicillins).

## 2016-05-10 NOTE — Telephone Encounter (Signed)
Spoke w caller and communicated recommendations & instructions for rx, and informed that this was sent to [facility] [place] She voiced thanks for prompt assistance and awareness of instructions.

## 2016-05-10 NOTE — Telephone Encounter (Signed)
Pt of Dr. Loletha Grayer  s/p AVR, meets proph criteria for routine dental work.  Called wife back, she notes Dr. Sallyanne Kuster typically writes antibiotic prescription rather than dentist office. Pt needing more frequent dental work, she asks if it's possible to have refills on the antibiotic.  Notes clindamycin used last time. Aware I will seek preferred med recommendation from provider and call her back.

## 2016-05-10 NOTE — Telephone Encounter (Signed)
Marcus [name] because Mr. Kyllo is calling to see if they can get a new prescription for an antibiotic to go to the dentist . Please call

## 2016-05-11 ENCOUNTER — Ambulatory Visit (INDEPENDENT_AMBULATORY_CARE_PROVIDER_SITE_OTHER): Payer: Medicare HMO | Admitting: Pharmacist Clinician (PhC)/ Clinical Pharmacy Specialist

## 2016-05-11 DIAGNOSIS — Z952 Presence of prosthetic heart valve: Secondary | ICD-10-CM

## 2016-05-11 DIAGNOSIS — Z7901 Long term (current) use of anticoagulants: Secondary | ICD-10-CM

## 2016-05-24 LAB — PROTIME-INR: INR: 3.2 — AB (ref 0.9–1.1)

## 2016-05-25 ENCOUNTER — Ambulatory Visit (INDEPENDENT_AMBULATORY_CARE_PROVIDER_SITE_OTHER): Payer: Medicare HMO | Admitting: Pharmacist

## 2016-05-25 DIAGNOSIS — Z7901 Long term (current) use of anticoagulants: Secondary | ICD-10-CM

## 2016-05-25 DIAGNOSIS — Z952 Presence of prosthetic heart valve: Secondary | ICD-10-CM

## 2016-05-26 DIAGNOSIS — Z952 Presence of prosthetic heart valve: Secondary | ICD-10-CM | POA: Diagnosis not present

## 2016-06-01 ENCOUNTER — Telehealth: Payer: Self-pay | Admitting: Cardiovascular Disease

## 2016-06-01 MED ORDER — ATORVASTATIN CALCIUM 80 MG PO TABS: ORAL_TABLET | ORAL | 1 refills | Status: DC

## 2016-06-01 MED ORDER — RAMIPRIL 10 MG PO CAPS: ORAL_CAPSULE | ORAL | 1 refills | Status: DC

## 2016-06-01 MED ORDER — WARFARIN SODIUM 10 MG PO TABS: ORAL_TABLET | ORAL | 0 refills | Status: DC

## 2016-06-01 NOTE — Telephone Encounter (Signed)
E- [facility] [place] Ramipril, atorvastatin,warfarin # 90 supply with 1 refill  patient aware.

## 2016-06-01 NOTE — Telephone Encounter (Signed)
New message    Saralyn Pilar from [facility] is calling about new prescriptions for pt. Pt is changing pharmacies and [facility] [place] [place] If patient is at the pharmacy, call can be transferred to refill team.   1. Which medications need to be refilled? (please list name of each medication and dose if known) [facility]'t [facility] a [facility]. He said pt called him and asked to call us to send over his current medications.  2. Which pharmacy/location (including street and city if local pharmacy) is medication to be sent to?[facility] [place] [place] Do they need a 30 day or 90 day supply? 90 day.

## 2016-06-08 ENCOUNTER — Other Ambulatory Visit: Payer: Self-pay | Admitting: Cardiovascular Disease

## 2016-06-11 LAB — POCT INR: INR: 2.8

## 2016-06-12 ENCOUNTER — Ambulatory Visit (INDEPENDENT_AMBULATORY_CARE_PROVIDER_SITE_OTHER): Payer: Medicare HMO | Admitting: Pharmacist Clinician (PhC)/ Clinical Pharmacy Specialist

## 2016-06-12 DIAGNOSIS — Z7901 Long term (current) use of anticoagulants: Secondary | ICD-10-CM

## 2016-06-12 DIAGNOSIS — Z952 Presence of prosthetic heart valve: Secondary | ICD-10-CM

## 2016-06-28 ENCOUNTER — Encounter: Payer: Self-pay | Admitting: Pulmonary Disease

## 2016-06-29 DIAGNOSIS — D2371 Other benign neoplasm of skin of right lower limb, including hip: Secondary | ICD-10-CM | POA: Diagnosis not present

## 2016-06-29 DIAGNOSIS — L720 Epidermal cyst: Secondary | ICD-10-CM | POA: Diagnosis not present

## 2016-06-29 DIAGNOSIS — L814 Other melanin hyperpigmentation: Secondary | ICD-10-CM | POA: Diagnosis not present

## 2016-06-29 DIAGNOSIS — D485 Neoplasm of uncertain behavior of skin: Secondary | ICD-10-CM | POA: Diagnosis not present

## 2016-06-29 DIAGNOSIS — L821 Other seborrheic keratosis: Secondary | ICD-10-CM | POA: Diagnosis not present

## 2016-06-29 DIAGNOSIS — L570 Actinic keratosis: Secondary | ICD-10-CM | POA: Diagnosis not present

## 2016-06-29 DIAGNOSIS — X32XXXA Exposure to sunlight, initial encounter: Secondary | ICD-10-CM | POA: Diagnosis not present

## 2016-06-29 DIAGNOSIS — D692 Other nonthrombocytopenic purpura: Secondary | ICD-10-CM | POA: Diagnosis not present

## 2016-06-30 ENCOUNTER — Ambulatory Visit (INDEPENDENT_AMBULATORY_CARE_PROVIDER_SITE_OTHER): Payer: Medicare HMO | Admitting: Pulmonary Disease

## 2016-06-30 ENCOUNTER — Encounter: Payer: Self-pay | Admitting: Pulmonary Disease

## 2016-06-30 DIAGNOSIS — G4733 Obstructive sleep apnea (adult) (pediatric): Secondary | ICD-10-CM

## 2016-06-30 DIAGNOSIS — I10 Essential (primary) hypertension: Secondary | ICD-10-CM | POA: Diagnosis not present

## 2016-06-30 NOTE — Assessment & Plan Note (Signed)
controlled 

## 2016-06-30 NOTE — Assessment & Plan Note (Signed)
CPAP supplies will be renewed for a year. 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 Advised against medications with sedative side effects Cautioned against driving when sleepy - understanding that sleepiness will vary on a day to day basis

## 2016-06-30 NOTE — Addendum Note (Signed)
Addended by: Valerie Salts on: 06/30/2016 04:38 PM   Modules accepted: Orders

## 2016-06-30 NOTE — Patient Instructions (Signed)
CPAP supplies will be renewed for a year. We will ask [facility] to check the download report on your  machine

## 2016-06-30 NOTE — Progress Notes (Signed)
   Subjective:    Patient ID: Marcus [name], male    DOB: 30-Jan-1946, 71 y.o.   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 His wife also has sleep apnea and follows with [place] [facility] 2009 showed an AHI of 26/hour (Dr Claiborne Billings) - 191 lbs. This was performed after he had CABG and aVR. Follow-up CPAP titration showed increased presence of centrals, hence he was placed on auto [facility]APDME is [facility]   Chief Complaint  Patient presents with  . Follow-up    10yrOSA visit. Breathing has been fine. Machine is working well.    He is tolerating his auto BiPAP well. He has a full face mask due to being a mouth breather. Due to increased pressure in his lower jaw he reports some bleeding gums. Otherwise feels rested when he wakes up and does not have any problems driving.  Review. A home sleep study in 08/2015 which showed very mild OSA especially in the supine position 13/h. He slept was right as well as in the left lateral position and did not have any events < 5/h His weight is unchanged at 192 pounds for the last 6 years  07/2015 auto BiPAP - average pressure 16/13, no residuals, good seal, settings are fine. 5/2017HST mild OSA , esp supine  Past Medical History:  Diagnosis Date  . [place]    ablation  . Hypertension   . Obstructive sleep apnea 10/25/2007   AHI-0.71/hr  . S/P AVR    2D ECHO, 04/25/2011 - EF >55%, Right ventricle-mild-moderately dilated  . Swelling of limb    LEA VENOUS, 08/22/2009 - no evidence of deep vein or superficial thrombosis; partially rupturing Baker's Cyst     Review of Systems Patient denies significant dyspnea,cough, hemoptysis,  chest pain, palpitations, pedal edema, orthopnea, paroxysmal nocturnal dyspnea, lightheadedness, nausea, vomiting, abdominal or  leg pains      Objective:   Physical Exam Gen. Pleasant, obese, in no distress ENT - class 2 airway, no post nasal drip Neck: No JVD, no thyromegaly, no carotid  bruits Lungs: no use of accessory muscles, no dullness to percussion, decreased without rales or rhonchi  Cardiovascular: Rhythm regular, heart sounds  normal, no murmurs or gallops, no peripheral edema Musculoskeletal: No deformities, no cyanosis or clubbing , no tremors        Assessment & Plan:

## 2016-07-05 ENCOUNTER — Ambulatory Visit (INDEPENDENT_AMBULATORY_CARE_PROVIDER_SITE_OTHER): Payer: Medicare HMO | Admitting: Pharmacist

## 2016-07-05 DIAGNOSIS — Z7901 Long term (current) use of anticoagulants: Secondary | ICD-10-CM

## 2016-07-05 DIAGNOSIS — Z952 Presence of prosthetic heart valve: Secondary | ICD-10-CM

## 2016-07-05 LAB — PROTIME-INR: INR: 3.2 — AB (ref 0.9–1.1)

## 2016-07-13 DIAGNOSIS — E559 Vitamin D deficiency, unspecified: Secondary | ICD-10-CM | POA: Diagnosis not present

## 2016-07-13 DIAGNOSIS — E291 Testicular hypofunction: Secondary | ICD-10-CM | POA: Diagnosis not present

## 2016-07-13 DIAGNOSIS — Z952 Presence of prosthetic heart valve: Secondary | ICD-10-CM | POA: Diagnosis not present

## 2016-07-13 DIAGNOSIS — I10 Essential (primary) hypertension: Secondary | ICD-10-CM | POA: Diagnosis not present

## 2016-07-14 ENCOUNTER — Encounter: Payer: Self-pay | Admitting: Cardiovascular Disease

## 2016-07-14 ENCOUNTER — Ambulatory Visit (INDEPENDENT_AMBULATORY_CARE_PROVIDER_SITE_OTHER): Payer: Medicare HMO | Admitting: Cardiovascular Disease

## 2016-07-14 VITALS — BP 122/70 | HR 57 | Ht 71.0 in | Wt 197.0 lb

## 2016-07-14 DIAGNOSIS — Z952 Presence of prosthetic heart valve: Secondary | ICD-10-CM

## 2016-07-14 DIAGNOSIS — E7800 Pure hypercholesterolemia, unspecified: Secondary | ICD-10-CM | POA: Diagnosis not present

## 2016-07-14 DIAGNOSIS — I25810 Atherosclerosis of coronary artery bypass graft(s) without angina pectoris: Secondary | ICD-10-CM

## 2016-07-14 DIAGNOSIS — I10 Essential (primary) hypertension: Secondary | ICD-10-CM | POA: Diagnosis not present

## 2016-07-14 DIAGNOSIS — Z8679 Personal history of other diseases of the circulatory system: Secondary | ICD-10-CM | POA: Diagnosis not present

## 2016-07-14 DIAGNOSIS — R001 Bradycardia, unspecified: Secondary | ICD-10-CM

## 2016-07-14 DIAGNOSIS — Z9889 Other specified postprocedural states: Secondary | ICD-10-CM | POA: Diagnosis not present

## 2016-07-14 DIAGNOSIS — Z7901 Long term (current) use of anticoagulants: Secondary | ICD-10-CM

## 2016-07-14 DIAGNOSIS — I9789 Other postprocedural complications and disorders of the circulatory system, not elsewhere classified: Secondary | ICD-10-CM | POA: Diagnosis not present

## 2016-07-14 DIAGNOSIS — I4891 Unspecified atrial fibrillation: Secondary | ICD-10-CM

## 2016-07-14 DIAGNOSIS — G4733 Obstructive sleep apnea (adult) (pediatric): Secondary | ICD-10-CM | POA: Diagnosis not present

## 2016-07-14 NOTE — Patient Instructions (Signed)
Dr [name] recommends that you schedule a follow-up appointment in 1 year. You will receive a reminder letter in the mail two months in advance. If you don't receive a letter, please call our office to schedule the follow-up appointment.  If you need a refill on your cardiac medications before your next appointment, please call your pharmacy. 

## 2016-07-14 NOTE — Progress Notes (Signed)
. Patient ID: Marcus [name], male   DOB: 04/22/1945, 71 y.o.   MRN: [id number]    Cardiology Office Note    Date:  07/14/2016   ID:  Marcus [name], DOB 04-Dec-1945, MRN [id number]  PCP:  Norberto Sorenson, MD  Cardiologist:   [name], MD   Chief Complaint  Patient presents with  . Follow-up    pt c/o DOE    History of Present Illness:  Marcus [name] is a 71 y.o. male who presents for follow-up of aortic valve mechanical prosthesis, previous repair of ascending aortic aneurysm, severe asymptomatic sinus bradycardia and coronary artery disease with previous bypass surgery (Severe AI - Bentall 23 mm St. Jude AVR, CABG x 2 - SVG-RCA and SVG-OM 2008, [place]).  Marcus [name] feels great. He has decided to retire (again) as of December 2017. However he is still very busy and physically very active. He walks daily for 45 miles. At a recent vacation at the beach he had to walk up 3 flights of stairs several times a day without any difficulty. He complains that he is more short of breath and in the past working in the yard, but he is clearly performing very hard labor. He is pulling out tree roots and carrying chopped wood he only has to stop and rest every 2 or 3 hours. He denies angina pectoris and has not had palpitations, dizziness, bleeding, focal neurological complaints, syncope or persistent leg edema. Recently he had transient swelling in his right ankle but this has resolved. He is on chronic warfarin anticoagulation. He has a history of a right leg Baker's cyst.  He reports 100% compliance with CPAP for sleep apnea. He monitors warfarin anticoagulation with a home monitor, supervised by our Coumadin clinic.  He had a normal stress echo one year ago. CT angiogram of the aorta and March 2015 was normal. He has normal left ventricular systolic function and normal gradients across his aortic valve prosthesis.  He is scheduled for a physical later this month with Dr. Merlyn Lot. He has already had his  blood tests including a lipid profile drawn, but the results are not yet available.  Past Medical History:  Diagnosis Date  . Atrial fibrillation ([place])    ablation  . Hypertension   . Obstructive sleep apnea 10/25/2007   AHI-0.71/hr  . S/P AVR    2D ECHO, 04/25/2011 - EF >55%, Right ventricle-mild-moderately dilated  . Swelling of limb    LEA VENOUS, 08/22/2009 - no evidence of deep vein or superficial thrombosis; partially rupturing Baker's Cyst    Past Surgical History:  Procedure Laterality Date  . CARDIAC CATHETERIZATION Bilateral 05/10/2007   Significant 1-vessel disease, severely dilated aortic root with moderate severe aortic insufficiency  . CARDIAC SURGERY    . CARDIOVERSION  08/02/2007   150 Joule biphasic shock with restoration of sinus rhythm. Heart rate 60.  Marland Kitchen NM MYOVIEW LTD  04/08/2007   No evidence of inducible myocardial ischemia    Current Medications: Outpatient Medications Prior to Visit  Medication Sig Dispense Refill  . Ascorbic Acid (VITAMIN C) 1000 MG tablet Take 1,000 mg by mouth daily.    Marland Kitchen aspirin EC 81 MG tablet Take 81 mg by mouth daily.    Marland Kitchen atorvastatin (LIPITOR) 80 MG tablet TAKE 1 TABLET(80 MG) BY MOUTH DAILY 90 tablet 1  . Calcium Carb-Cholecalciferol (CALCIUM 1000 + D PO) Take 1,200 mg by mouth daily.     . cetirizine (ZYRTEC) 10 MG tablet Take 10 mg  by mouth daily.    . Cholecalciferol (VITAMIN D) 2000 UNITS CAPS Take 1 capsule by mouth daily.    Marland Kitchen CINNAMON PO Take 1 tablet by mouth daily.    . clindamycin (CLEOCIN) 300 MG capsule Take 2 tablets (600 mg total) 30-60 minutes prior to dental or medical procedure. 2 capsule 5  . Multiple Vitamins-Minerals (MULTIVITAMIN WITH MINERALS) tablet Take 1 tablet by mouth daily.    . Omega-3 Krill Oil 500 MG CAPS Take 1 capsule by mouth daily.    . ramipril (ALTACE) 10 MG capsule TAKE 1 CAPSULE BY MOUTH EVERY DAY 90 capsule 1  . vitamin B-12 (CYANOCOBALAMIN) 1000 MCG tablet Take 1,000 mcg by mouth daily.      Marland Kitchen warfarin (COUMADIN) 10 MG tablet TAKE 1 AND 1/2 TABLET BY MOUTH DAILY AND ADJUST DOSE AS DIRECTED 135 tablet 0   No facility-administered medications prior to visit.      Allergies:   Amlodipine; Tetanus toxoids; Crestor [rosuvastatin]; and Penicillins   Social History   Social History  . Marital status: Married    Spouse name: N/A  . Number of children: N/A  . Years of education: N/A   Social History Main Topics  . Smoking status: Former Smoker    Types: Cigarettes    Quit date: 04/11/1987  . Smokeless tobacco: Never Used  . Alcohol use No  . Drug use: No  . Sexual activity: Not Asked   Other Topics Concern  . None   Social History Narrative  . None     Family History:  The patient's family history includes Cancer in his father.   ROS:   Please see the history of present illness.    ROS All other systems reviewed and are negative.   PHYSICAL EXAM:   VS:  BP 122/70   Pulse (!) 57   Ht '5\' 11"'$  (1.803 m)   Wt 89.4 kg (197 lb)   BMI 27.48 kg/m    GEN: Well nourished, well developed, in no acute distress  HEENT: normal  Neck: no JVD, carotid bruits, or masses Cardiac:  crisp prosthetic valve clicks, RRR; 0-3/4 early peaking aortic focus systolic ejection murmur, no diastolic murmurs, rubs, or gallops,no edema  Respiratory:  clear to auscultation bilaterally, normal work of breathing GI: soft, nontender, nondistended, + BS MS: no deformity or atrophy  Skin: warm and dry, no rash Neuro:  Alert and Oriented x 3, Strength and sensation are intact Psych: euthymic mood, full affect  Wt Readings from Last 3 Encounters:  07/14/16 89.4 kg (197 lb)  06/30/16 88.5 kg (195 lb)  08/03/15 87.3 kg (192 lb 6.4 oz)      Studies/Labs Reviewed:   EKG:  EKG is ordered today.  The ekg ordered today demonstrates mild sinus bradycardia, right bundle branch block (old)   ASSESSMENT:    1. Coronary artery disease involving coronary bypass graft of native heart without angina  pectoris   2. HTN, goal below 130/80   3. Pure hypercholesterolemia   4. Bradycardia, severe sinus   5. Postoperative atrial fibrillation (HCC)   6. OSA (obstructive sleep apnea)   7. S/P thoracic aortic aneurysm repair   8. H/O aortic valve replacement   9. Long term current use of anticoagulant therapy      PLAN:  In order of problems listed above:  1. CAD s/p CABG: Preserved left ventricular systolic function, no angina pectoris, risk factors generally well addressed.He has excellent functional status. His wife believes that he  is working very intensely and that his expectations for his stamina and endurance are unreasonable at his age. I do believe that he is doing well and don't think additional testing is necessary at this time. He had a normal stress echo one year ago 2. HTN: Excellent control 3. HLP: Repeat lipids will be available for review later this week 4. He has had more severe sinus bradycardia for years, never symptomatic 5. Postop AF: Required cardioversion in 2009 without any clinically evident recurrence since that time. He is on anticoagulation anyway for his prosthetic aortic valve 6. OSA: Reports 100% compliance with CPAP 7. Ao aneurysm s/p Bentall repair: No evidence of other areas of aortic dilation on 2015 CT angiogram. At this point plan to repeat once every 5 years or so.  8. In 2020 9. Mechanical AVR: Normal function of aortic valve prosthesis by exam and by recent echocardiography 10. Warfarin: No bleeding complications, no embolic events. Has a home INR monitor.    Medication Adjustments/Labs and Tests Ordered: Current medicines are reviewed at length with the patient today.  Concerns regarding medicines are outlined above.  Medication changes, Labs and Tests ordered today are listed in the Patient Instructions below. Patient Instructions  Dr Sallyanne Kuster recommends that you schedule a follow-up appointment in 1 year. You will receive a reminder letter in the  mail two months in advance. If you don't receive a letter, please call our office to schedule the follow-up appointment.  If you need a refill on your cardiac medications before your next appointment, please call your pharmacy.    Signed, [name], MD  07/14/2016 8:32 AM    [facility] [address], [place], [place]  [address] Phone: [phone]; Fax: [phone]  \

## 2016-07-18 ENCOUNTER — Telehealth: Payer: Self-pay | Admitting: Pulmonary Disease

## 2016-07-18 NOTE — Telephone Encounter (Signed)
Received BiPap DL from [facility]. DL showed good usage. Average pressure 16/13. No change to settings.

## 2016-07-31 DIAGNOSIS — Z0000 Encounter for general adult medical examination without abnormal findings: Secondary | ICD-10-CM | POA: Diagnosis not present

## 2016-07-31 DIAGNOSIS — E291 Testicular hypofunction: Secondary | ICD-10-CM | POA: Diagnosis not present

## 2016-07-31 DIAGNOSIS — Z952 Presence of prosthetic heart valve: Secondary | ICD-10-CM | POA: Diagnosis not present

## 2016-07-31 DIAGNOSIS — E559 Vitamin D deficiency, unspecified: Secondary | ICD-10-CM | POA: Diagnosis not present

## 2016-07-31 DIAGNOSIS — G4730 Sleep apnea, unspecified: Secondary | ICD-10-CM | POA: Diagnosis not present

## 2016-07-31 DIAGNOSIS — I10 Essential (primary) hypertension: Secondary | ICD-10-CM | POA: Diagnosis not present

## 2016-07-31 LAB — POCT INR: INR: 3.5

## 2016-08-01 ENCOUNTER — Ambulatory Visit (INDEPENDENT_AMBULATORY_CARE_PROVIDER_SITE_OTHER): Payer: Self-pay | Admitting: Pharmacist Clinician (PhC)/ Clinical Pharmacy Specialist

## 2016-08-01 DIAGNOSIS — Z7901 Long term (current) use of anticoagulants: Secondary | ICD-10-CM

## 2016-08-01 DIAGNOSIS — Z952 Presence of prosthetic heart valve: Secondary | ICD-10-CM

## 2016-08-21 LAB — POCT INR: INR: 3.6

## 2016-08-22 ENCOUNTER — Ambulatory Visit (INDEPENDENT_AMBULATORY_CARE_PROVIDER_SITE_OTHER): Payer: Medicare HMO | Admitting: Pharmacist Clinician (PhC)/ Clinical Pharmacy Specialist

## 2016-08-22 DIAGNOSIS — Z7901 Long term (current) use of anticoagulants: Secondary | ICD-10-CM

## 2016-08-22 DIAGNOSIS — Z952 Presence of prosthetic heart valve: Secondary | ICD-10-CM | POA: Diagnosis not present

## 2016-08-24 DIAGNOSIS — R69 Illness, unspecified: Secondary | ICD-10-CM | POA: Diagnosis not present

## 2016-08-26 ENCOUNTER — Encounter: Payer: Self-pay | Admitting: Cardiovascular Disease

## 2016-09-05 ENCOUNTER — Ambulatory Visit (INDEPENDENT_AMBULATORY_CARE_PROVIDER_SITE_OTHER): Payer: Medicare HMO | Admitting: Pharmacist

## 2016-09-05 DIAGNOSIS — Z952 Presence of prosthetic heart valve: Secondary | ICD-10-CM

## 2016-09-05 DIAGNOSIS — Z7901 Long term (current) use of anticoagulants: Secondary | ICD-10-CM

## 2016-09-05 LAB — PROTIME-INR: INR: 2.8 — AB (ref 0.9–1.1)

## 2016-09-21 DIAGNOSIS — R69 Illness, unspecified: Secondary | ICD-10-CM | POA: Diagnosis not present

## 2016-09-25 LAB — PROTIME-INR: INR: 2.3 — AB (ref 0.9–1.1)

## 2016-09-26 ENCOUNTER — Ambulatory Visit (INDEPENDENT_AMBULATORY_CARE_PROVIDER_SITE_OTHER): Payer: Medicare HMO | Admitting: Pharmacist

## 2016-09-26 DIAGNOSIS — Z952 Presence of prosthetic heart valve: Secondary | ICD-10-CM

## 2016-09-26 DIAGNOSIS — Z0000 Encounter for general adult medical examination without abnormal findings: Secondary | ICD-10-CM | POA: Diagnosis not present

## 2016-09-26 DIAGNOSIS — Z7689 Persons encountering health services in other specified circumstances: Secondary | ICD-10-CM | POA: Diagnosis not present

## 2016-09-26 DIAGNOSIS — Z7901 Long term (current) use of anticoagulants: Secondary | ICD-10-CM

## 2016-10-15 LAB — PROTIME-INR: INR: 2.7 — AB (ref 0.9–1.1)

## 2016-10-16 ENCOUNTER — Ambulatory Visit (INDEPENDENT_AMBULATORY_CARE_PROVIDER_SITE_OTHER): Payer: Medicare HMO | Admitting: Pharmacist

## 2016-10-16 DIAGNOSIS — Z7901 Long term (current) use of anticoagulants: Secondary | ICD-10-CM

## 2016-10-16 DIAGNOSIS — Z952 Presence of prosthetic heart valve: Secondary | ICD-10-CM

## 2016-10-23 ENCOUNTER — Telehealth: Payer: Self-pay | Admitting: Cardiovascular Disease

## 2016-10-23 MED ORDER — CLINDAMYCIN HCL 300 MG PO CAPS: ORAL_CAPSULE | ORAL | 5 refills | Status: DC

## 2016-10-23 NOTE — Telephone Encounter (Signed)
New message  Pt c/o medication issue:  1. Name of Medication: Clindamycin   2. How are you currently taking this medication (dosage and times per day)? 300mg    3. Are you having a reaction (difficulty breathing--STAT)? no  4. What is your medication issue? Pt wife call to get an authorization for medication. Please call back to discuss

## 2016-10-23 NOTE — Telephone Encounter (Signed)
Spoke w wife. Pt takes clindamycin for dental prophylaxis. He usually goes to dentist three times a year, but is now having more frequent visits for dental work.  His clindamycin refills require authorization. She needs Dr. Victorino December [place] to refill. States they usually get 6 pills at a time.  Last Rx was for 300mg  2 tablets dispensed w 5 refills.  Routed to provider for review.

## 2016-10-23 NOTE — Telephone Encounter (Signed)
Please refill MCr

## 2016-10-23 NOTE — Telephone Encounter (Signed)
Refilled, wife aware and verbalized thanks.

## 2016-11-06 ENCOUNTER — Ambulatory Visit (INDEPENDENT_AMBULATORY_CARE_PROVIDER_SITE_OTHER): Payer: Self-pay | Admitting: Pharmacist Clinician (PhC)/ Clinical Pharmacy Specialist

## 2016-11-06 DIAGNOSIS — Z952 Presence of prosthetic heart valve: Secondary | ICD-10-CM

## 2016-11-06 DIAGNOSIS — Z7901 Long term (current) use of anticoagulants: Secondary | ICD-10-CM

## 2016-11-06 LAB — POCT INR: INR: 2.7

## 2016-11-15 DIAGNOSIS — D689 Coagulation defect, unspecified: Secondary | ICD-10-CM | POA: Diagnosis not present

## 2016-11-15 DIAGNOSIS — Z86010 Personal history of colonic polyps: Secondary | ICD-10-CM | POA: Diagnosis not present

## 2016-11-15 DIAGNOSIS — Z952 Presence of prosthetic heart valve: Secondary | ICD-10-CM | POA: Diagnosis not present

## 2016-11-16 ENCOUNTER — Other Ambulatory Visit: Payer: Self-pay | Admitting: Gastroenterology

## 2016-11-20 ENCOUNTER — Other Ambulatory Visit: Payer: Self-pay | Admitting: Cardiovascular Disease

## 2016-11-20 ENCOUNTER — Telehealth: Payer: Self-pay

## 2016-11-20 ENCOUNTER — Encounter: Payer: Self-pay | Admitting: Cardiovascular Disease

## 2016-11-20 NOTE — Telephone Encounter (Signed)
1. Type of surgery: colonoscopy 2. Date of surgery: 12/15/16 3. Surgeon: Dr Benson Norway 4. Medications that need to be held & how long: Warfarin 10 mg as directed per coumadin clinic 5. Fax and/or Phone: (p) [phone] (f) [phone]

## 2016-11-20 NOTE — Telephone Encounter (Signed)
epicd 

## 2016-11-22 DIAGNOSIS — R208 Other disturbances of skin sensation: Secondary | ICD-10-CM | POA: Diagnosis not present

## 2016-11-22 DIAGNOSIS — L820 Inflamed seborrheic keratosis: Secondary | ICD-10-CM | POA: Diagnosis not present

## 2016-11-22 DIAGNOSIS — R209 Unspecified disturbances of skin sensation: Secondary | ICD-10-CM | POA: Diagnosis not present

## 2016-11-22 DIAGNOSIS — L570 Actinic keratosis: Secondary | ICD-10-CM | POA: Diagnosis not present

## 2016-11-22 DIAGNOSIS — X32XXXA Exposure to sunlight, initial encounter: Secondary | ICD-10-CM | POA: Diagnosis not present

## 2016-11-22 DIAGNOSIS — R238 Other skin changes: Secondary | ICD-10-CM | POA: Diagnosis not present

## 2016-11-22 DIAGNOSIS — L538 Other specified erythematous conditions: Secondary | ICD-10-CM | POA: Diagnosis not present

## 2016-11-23 NOTE — Telephone Encounter (Signed)
Follow up      Pt is due to have a colonoscopy in sept.  He does not want to stop his coumadin because he is afraid he will have a stroke.  Pt stated that when he had a colonoscopy the last time, he did not stop his medication.  Talk to the doctor to get his opinion about this.  Please call at your convenience.

## 2016-11-23 NOTE — Telephone Encounter (Signed)
Talked to patient and discussed risk of bleeding during procedure associated with not holding his warfarin vs risk of stroke by holding warfarin for only 5 days.   Patient insist on NOT holding warfarin prior to colonoscopy. Instructed to call Dr Benson Norway to discuss anticoagulation prior to procedure with him.

## 2016-11-26 ENCOUNTER — Other Ambulatory Visit: Payer: Self-pay | Admitting: Cardiovascular Disease

## 2016-11-28 ENCOUNTER — Encounter (HOSPITAL_COMMUNITY): Payer: Self-pay | Admitting: *Deleted

## 2016-11-30 ENCOUNTER — Encounter (HOSPITAL_COMMUNITY): Payer: Self-pay | Admitting: *Deleted

## 2016-12-07 ENCOUNTER — Telehealth: Payer: Self-pay | Admitting: Pharmacist Clinician (PhC)/ Clinical Pharmacy Specialist

## 2016-12-07 NOTE — Telephone Encounter (Signed)
Coumadin letter

## 2016-12-09 LAB — POCT INR: INR: 2.6

## 2016-12-12 ENCOUNTER — Ambulatory Visit (INDEPENDENT_AMBULATORY_CARE_PROVIDER_SITE_OTHER): Payer: Medicare HMO | Admitting: Pharmacist Clinician (PhC)/ Clinical Pharmacy Specialist

## 2016-12-12 DIAGNOSIS — Z7901 Long term (current) use of anticoagulants: Secondary | ICD-10-CM | POA: Diagnosis not present

## 2016-12-12 DIAGNOSIS — Z952 Presence of prosthetic heart valve: Secondary | ICD-10-CM | POA: Diagnosis not present

## 2016-12-15 ENCOUNTER — Ambulatory Visit (HOSPITAL_COMMUNITY): Payer: Medicare HMO | Admitting: Anesthesiology

## 2016-12-15 ENCOUNTER — Ambulatory Visit (HOSPITAL_COMMUNITY)
Admission: RE | Admit: 2016-12-15 | Discharge: 2016-12-15 | Disposition: A | Discharge status: Home / Self Care | Payer: Medicare HMO | Source: Ambulatory Visit | Attending: Gastroenterology | Admitting: Gastroenterology

## 2016-12-15 ENCOUNTER — Ambulatory Visit (INDEPENDENT_AMBULATORY_CARE_PROVIDER_SITE_OTHER): Payer: Medicare HMO | Admitting: Pharmacist Clinician (PhC)/ Clinical Pharmacy Specialist

## 2016-12-15 ENCOUNTER — Encounter (HOSPITAL_COMMUNITY): Payer: Self-pay | Admitting: Anesthesiology

## 2016-12-15 ENCOUNTER — Encounter (HOSPITAL_COMMUNITY)
Admission: RE | Disposition: A | Discharge status: Home / Self Care | Payer: Self-pay | Source: Ambulatory Visit | Attending: Gastroenterology

## 2016-12-15 DIAGNOSIS — Z952 Presence of prosthetic heart valve: Secondary | ICD-10-CM

## 2016-12-15 DIAGNOSIS — K5730 Diverticulosis of large intestine without perforation or abscess without bleeding: Secondary | ICD-10-CM | POA: Diagnosis not present

## 2016-12-15 DIAGNOSIS — K5520 Angiodysplasia of colon without hemorrhage: Secondary | ICD-10-CM | POA: Insufficient documentation

## 2016-12-15 DIAGNOSIS — Z880 Allergy status to penicillin: Secondary | ICD-10-CM | POA: Diagnosis not present

## 2016-12-15 DIAGNOSIS — I4891 Unspecified atrial fibrillation: Secondary | ICD-10-CM | POA: Insufficient documentation

## 2016-12-15 DIAGNOSIS — Z7901 Long term (current) use of anticoagulants: Secondary | ICD-10-CM | POA: Diagnosis not present

## 2016-12-15 DIAGNOSIS — Z1211 Encounter for screening for malignant neoplasm of colon: Secondary | ICD-10-CM | POA: Insufficient documentation

## 2016-12-15 DIAGNOSIS — D122 Benign neoplasm of ascending colon: Secondary | ICD-10-CM | POA: Insufficient documentation

## 2016-12-15 DIAGNOSIS — Z87891 Personal history of nicotine dependence: Secondary | ICD-10-CM | POA: Insufficient documentation

## 2016-12-15 DIAGNOSIS — Z86010 Personal history of colonic polyps: Secondary | ICD-10-CM | POA: Diagnosis not present

## 2016-12-15 DIAGNOSIS — D127 Benign neoplasm of rectosigmoid junction: Secondary | ICD-10-CM | POA: Diagnosis not present

## 2016-12-15 DIAGNOSIS — I10 Essential (primary) hypertension: Secondary | ICD-10-CM | POA: Insufficient documentation

## 2016-12-15 HISTORY — DX: Dyspnea, unspecified: R06.00

## 2016-12-15 HISTORY — PX: COLONOSCOPY WITH PROPOFOL: SHX5780

## 2016-12-15 LAB — POCT INR: INR: 4.6

## 2016-12-15 LAB — PROTIME-INR

## 2016-12-15 SURGERY — COLONOSCOPY WITH PROPOFOL
Anesthesia: General

## 2016-12-15 MED ORDER — LIDOCAINE 2% (20 MG/ML) 5 ML SYRINGE
INTRAMUSCULAR | Status: AC
  Filled 2016-12-15: qty 5

## 2016-12-15 MED ORDER — PROPOFOL 10 MG/ML IV BOLUS
INTRAVENOUS | Status: AC
  Filled 2016-12-15: qty 40

## 2016-12-15 MED ORDER — LACTATED RINGERS IV SOLN
INTRAVENOUS | Status: DC
Start: 2016-12-15 — End: 2016-12-15
  Administered 2016-12-15: 1000 mL via INTRAVENOUS

## 2016-12-15 MED ORDER — PROPOFOL 10 MG/ML IV BOLUS
INTRAVENOUS | Status: DC | PRN
  Administered 2016-12-15 (×2): 20 mg via INTRAVENOUS

## 2016-12-15 MED ORDER — LIDOCAINE 2% (20 MG/ML) 5 ML SYRINGE
INTRAMUSCULAR | Status: DC | PRN
  Administered 2016-12-15: 100 mg via INTRAVENOUS

## 2016-12-15 MED ORDER — SODIUM CHLORIDE 0.9 % IV SOLN: INTRAVENOUS | Status: DC

## 2016-12-15 MED ORDER — PROPOFOL 500 MG/50ML IV EMUL
INTRAVENOUS | Status: DC | PRN
  Administered 2016-12-15: 140 ug/kg/min via INTRAVENOUS

## 2016-12-15 SURGICAL SUPPLY — 21 items

## 2016-12-15 NOTE — Discharge Instructions (Signed)

## 2016-12-15 NOTE — Anesthesia Postprocedure Evaluation (Addendum)
Anesthesia Post Note  Patient: Marny Lowenstein  Procedure(s) Performed: Procedure(s) (LRB): COLONOSCOPY WITH PROPOFOL (N/A)     Patient location during evaluation: PACU Anesthesia Type: MAC Level of consciousness: awake and alert Pain management: pain level controlled Vital Signs Assessment: post-procedure vital signs reviewed and stable Respiratory status: spontaneous breathing, nonlabored ventilation and respiratory function stable Cardiovascular status: blood pressure returned to baseline Anesthetic complications: no    Last Vitals:  Vitals:   12/15/16 1110 12/15/16 1120  BP: (!) 143/78 138/78  Pulse: (!) 48 (!) 50  Resp: 16 17  Temp:    SpO2: 100% 100%    Last Pain:  Vitals:   12/15/16 1104  TempSrc: Oral                 Audry Pili

## 2016-12-15 NOTE — Anesthesia Preprocedure Evaluation (Addendum)
Anesthesia Evaluation  Patient identified by MRN, date of birth, ID band Patient awake    Reviewed: Allergy & Precautions, NPO status , Patient's Chart, lab work & pertinent test results  Airway Mallampati: II  TM Distance: >3 FB Neck ROM: Full    Dental  (+) Dental Advisory Given   Pulmonary sleep apnea , former smoker,    Pulmonary exam normal breath sounds clear to auscultation       Cardiovascular Exercise Tolerance: Good hypertension, + CAD and + CABG  Normal cardiovascular exam+ dysrhythmias Atrial Fibrillation  Rhythm:Regular Rate:Normal + Systolic Click S/p AVR and Bentall  EKG SB with RBBB  Stress Echo 2017 - Stress echo: ETT positive for ischemia (16mm ST depression/aVR elevation) however no chest pain and good overall exercise time  9:30. Echo with normal hyperdynamic response to stress.  Moderate risk study based upon ETT portion of study   Neuro/Psych negative neurological ROS  negative psych ROS   GI/Hepatic negative GI ROS, Neg liver ROS,   Endo/Other  negative endocrine ROS  Renal/GU negative Renal ROS  negative genitourinary   Musculoskeletal negative musculoskeletal ROS (+)   Abdominal   Peds  Hematology negative hematology ROS (+)   Anesthesia Other Findings   Reproductive/Obstetrics                           Anesthesia Physical Anesthesia Plan  ASA: III  Anesthesia Plan: General   Post-op Pain Management:    Induction: Intravenous  PONV Risk Score and Plan: 2 and Ondansetron, Dexamethasone and Treatment may vary due to age or medical condition  Airway Management Planned: LMA  Additional Equipment: None  Intra-op Plan:   Post-operative Plan: Extubation in OR  Informed Consent: I have reviewed the patients History and Physical, chart, labs and discussed the procedure including the risks, benefits and alternatives for the proposed anesthesia with the patient  or authorized representative who has indicated his/her understanding and acceptance.   Dental advisory given  Plan Discussed with: CRNA  Anesthesia Plan Comments:         Anesthesia Quick Evaluation

## 2016-12-15 NOTE — Op Note (Addendum)
[facility] Patient Name: Marcus [name] Procedure Date: 12/15/2016 MRN: [id number] Attending MD: Carol Ada , MD Date of Birth: 17-Jan-1946 CSN: [id number] Age: 71 Admit Type: Outpatient Procedure:                Colonoscopy Indications:              High risk colon cancer surveillance: Personal                            history of colonic polyps Providers:                Carol Ada, MD, Cleda Daub, RN, William Dalton, Technician Referring MD:              Medicines:                Propofol per Anesthesia Complications:            No immediate complications. Estimated Blood Loss:     Estimated blood loss was minimal. Procedure:                Pre-Anesthesia Assessment:                           - Prior to the procedure, a History and Physical                            was performed, and patient medications and                            allergies were reviewed. The patient's tolerance of                            previous anesthesia was also reviewed. The risks                            and benefits of the procedure and the sedation                            options and risks were discussed with the patient.                            All questions were answered, and informed consent                            was obtained. Prior Anticoagulants: The patient has                            taken Coumadin (warfarin), last dose was day of                            procedure. ASA Grade Assessment: III - A patient  with severe systemic disease. After reviewing the                            risks and benefits, the patient was deemed in                            satisfactory condition to undergo the procedure.                           - Sedation was administered by an anesthesia                            professional. Deep sedation was attained.                           After obtaining informed consent, the  colonoscope                            was passed under direct vision. Throughout the                            procedure, the patient's blood pressure, pulse, and                            oxygen saturations were monitored continuously. The                            EC-[id number] ([id number]) scope was introduced through                            the anus and advanced to the the cecum, identified                            by appendiceal orifice and ileocecal valve. The                            colonoscopy was performed without difficulty. The                            patient tolerated the procedure well. The quality                            of the bowel preparation was good. The ileocecal                            valve, appendiceal orifice, and rectum were                            photographed. Scope In: 10:28:05 AM Scope Out: 11:00:21 AM Scope Withdrawal Time: 0 hours 27 minutes 3 seconds  Total Procedure Duration: 0 hours 32 minutes 16 seconds  Findings:      Two sessile polyps were found in the ascending colon. The polyps were 1       to 2 mm in size. These polyps  were removed with a cold biopsy forceps.       Resection and retrieval were complete.      A 2 mm polyp was found in the ascending colon. The polyp was sessile.       The polyp was removed with a cold snare. Resection and retrieval were       complete.      A 8 mm polyp was found in the recto-sigmoid colon. The polyp was       sessile. The polyp was removed with a hot snare. Resection and retrieval       were complete. To prevent bleeding post-intervention, two hemostatic       clips were successfully placed (MR unsafe). There was no bleeding at the       end of the procedure.      Scattered small and large-mouthed diverticula were found in the sigmoid       colon, descending colon and transverse colon.      The cold snare and cold biopsy polypectomies sites were monitored for       any bleeding. The sites clotted  quickly and bled minimally.      Two medium-sized localized angiodysplastic lesions without bleeding were       found in the cecum. Impression:               - Two 1 to 2 mm polyps in the ascending colon,                            removed with a cold biopsy forceps. Resected and                            retrieved.                           - One 2 mm polyp in the ascending colon, removed                            with a cold snare. Resected and retrieved.                           - One 8 mm polyp at the recto-sigmoid colon,                            removed with a hot snare. Resected and retrieved.                            Clips (MR unsafe) were placed.                           - Diverticulosis in the sigmoid colon, in the                            descending colon and in the transverse colon.                           - Two non-bleeding colonic angiodysplastic lesions. Moderate Sedation:      N/A- Per Anesthesia Care Recommendation:           -  Patient has a contact number available for                            emergencies. The signs and symptoms of potential                            delayed complications were discussed with the                            patient. Return to normal activities tomorrow.                            Written discharge instructions were provided to the                            patient.                           - Resume previous diet.                           - Continue present medications.                           - Await pathology results.                           - Repeat colonoscopy in 3 - 5 years for                            surveillance. Procedure Code(s):        --- Professional ---                           [phone], Colonoscopy, flexible; with removal of                            tumor(s), polyp(s), or other lesion(s) by snare                            technique                           45380, 31, Colonoscopy, flexible; with  biopsy,                            single or multiple Diagnosis Code(s):        --- Professional ---                           D12.2, Benign neoplasm of ascending colon                           Z86.010, Personal history of colonic polyps                           D12.7, Benign neoplasm of rectosigmoid junction  K55.20, Angiodysplasia of colon without hemorrhage                           K57.30, Diverticulosis of large intestine without                            perforation or abscess without bleeding CPT copyright 2016 American Medical Association. All rights reserved. The codes documented in this report are preliminary and upon coder review may  be revised to meet current compliance requirements. Carol Ada, MD Carol Ada, MD 12/15/2016 11:04:05 AM This report has been signed electronically. Number of Addenda: 0

## 2016-12-15 NOTE — Anesthesia Procedure Notes (Signed)
Date/Time: 12/15/2016 10:23 AM Performed by: Danley Danker L Oxygen Delivery Method: Simple face mask

## 2016-12-15 NOTE — H&P (Signed)
Marny Lowenstein HPI: At this time the patient denies any problems with nausea, vomiting, fevers, chills, abdominal pain, diarrhea, constipation, hematochezia, melena, GERD, or dysphagia. The patient denies any known family history of colon cancers. No complaints of chest pain, SOB, MI, or sleep apnea. On 12/03/2009 the patient's colonoscopy was negative for any adenomas and it was performed while he was on coumadin. After the procedure he did suffer with afib and it was controlled with medication. The afib was as a result of the electrolyte disturbance with the prep.  Past Medical History:  Diagnosis Date  . Atrial fibrillation ([place])   . Dyspnea    with exertion   . Hypertension   . Obstructive sleep apnea 10/25/2007    no longer on cpap  . S/P AVR    2D ECHO, 04/25/2011 - EF >55%, Right ventricle-mild-moderately dilated  . Swelling of limb    LEA VENOUS, 08/22/2009 - no evidence of deep vein or superficial thrombosis; partially rupturing Baker's Cyst    Past Surgical History:  Procedure Laterality Date  . CARDIAC CATHETERIZATION Bilateral 05/10/2007   Significant 1-vessel disease, severely dilated aortic root with moderate severe aortic insufficiency  . CARDIAC SURGERY    . CARDIOVERSION  08/02/2007   150 Joule biphasic shock with restoration of sinus rhythm. Heart rate 60.  . cataract surgery     . NM MYOVIEW LTD  04/08/2007   No evidence of inducible myocardial ischemia  . THYROIDECTOMY, PARTIAL    . torn meniscus in right knee surgery       Family History  Problem Relation Age of Onset  . Cancer Father     Social History:  reports that he quit smoking about 29 years ago. His smoking use included Cigarettes. He has never used smokeless tobacco. He reports that he does not drink alcohol or use drugs.  Allergies:  Allergies  Allergen Reactions  . Amlodipine Anaphylaxis    Weakness and fatigue   . Tetanus Toxoids Anaphylaxis  . Crestor [Rosuvastatin]     Myalgia   . Penicillins  Hives    Has patient had a PCN reaction causing immediate rash, facial/tongue/throat swelling, SOB or lightheadedness with hypotension: No Has patient had a PCN reaction causing severe rash involving mucus membranes or skin necrosis: Yes Has patient had a PCN reaction that required hospitalization: No Has patient had a PCN reaction occurring within the last 10 years: No If all of the above answers are "NO", then may proceed with Cephalosporin use.     Medications:  Scheduled:  Continuous: . sodium chloride    . lactated ringers      No results found for this or any previous visit (from the past 24 hour(s)).   No results found.  ROS:  As stated above in the HPI otherwise negative.  There were no vitals taken for this visit.    PE: Gen: NAD, Alert and Oriented HEENT:  [place]/AT, EOMI Neck: Supple, no LAD Lungs: CTA Bilaterally CV: RRR without M/G/R ABM: Soft, NTND, +BS Ext: No C/C/E  Assessment/Plan: 1) Personal history of polyps - colonoscopy.  Layaan Mott D 12/15/2016, 9:58 AM

## 2016-12-15 NOTE — Transfer of Care (Signed)
Immediate Anesthesia Transfer of Care Note  Patient: Marcus [name]  Procedure(s) Performed: Procedure(s): COLONOSCOPY WITH PROPOFOL (N/A)  Patient Location: Endoscopy Unit  Anesthesia Type:MAC  Level of Consciousness: awake  Airway & Oxygen Therapy: Patient Spontanous Breathing and Patient connected to face mask oxygen  Post-op Assessment: Report given to RN and Post -op Vital signs reviewed and stable  Post vital signs: Reviewed and stable  Last Vitals:  Vitals:   12/15/16 1003  BP: (!) 180/81  Pulse: (!) 57  Resp: 18  Temp: 36.4 C  SpO2: 97%    Last Pain:  Vitals:   12/15/16 1003  TempSrc: Oral         Complications: No apparent anesthesia complications

## 2016-12-18 ENCOUNTER — Encounter (HOSPITAL_COMMUNITY): Payer: Self-pay | Admitting: Gastroenterology

## 2016-12-18 NOTE — Addendum Note (Signed)
Addendum  created 12/18/16 1318 by Audry Pili, MD   Sign clinical note

## 2016-12-19 DIAGNOSIS — H353132 Nonexudative age-related macular degeneration, bilateral, intermediate dry stage: Secondary | ICD-10-CM | POA: Diagnosis not present

## 2016-12-19 DIAGNOSIS — H5000 Unspecified esotropia: Secondary | ICD-10-CM | POA: Diagnosis not present

## 2016-12-19 DIAGNOSIS — Z0100 Encounter for examination of eyes and vision without abnormal findings: Secondary | ICD-10-CM | POA: Diagnosis not present

## 2016-12-19 DIAGNOSIS — Z961 Presence of intraocular lens: Secondary | ICD-10-CM | POA: Diagnosis not present

## 2016-12-19 DIAGNOSIS — H35033 Hypertensive retinopathy, bilateral: Secondary | ICD-10-CM | POA: Diagnosis not present

## 2016-12-19 DIAGNOSIS — H02831 Dermatochalasis of right upper eyelid: Secondary | ICD-10-CM | POA: Diagnosis not present

## 2016-12-19 DIAGNOSIS — H524 Presbyopia: Secondary | ICD-10-CM | POA: Diagnosis not present

## 2016-12-19 DIAGNOSIS — H52223 Regular astigmatism, bilateral: Secondary | ICD-10-CM | POA: Diagnosis not present

## 2016-12-19 DIAGNOSIS — H02834 Dermatochalasis of left upper eyelid: Secondary | ICD-10-CM | POA: Diagnosis not present

## 2016-12-20 LAB — POCT INR: INR: 1.4

## 2016-12-21 ENCOUNTER — Ambulatory Visit (INDEPENDENT_AMBULATORY_CARE_PROVIDER_SITE_OTHER): Payer: Medicare HMO | Admitting: Pharmacist

## 2016-12-21 ENCOUNTER — Telehealth: Payer: Self-pay

## 2016-12-21 DIAGNOSIS — Z952 Presence of prosthetic heart valve: Secondary | ICD-10-CM

## 2016-12-21 DIAGNOSIS — Z7901 Long term (current) use of anticoagulants: Secondary | ICD-10-CM

## 2016-12-21 NOTE — Telephone Encounter (Signed)
Thanks for the update.  See anti-coagulation note fore details

## 2016-12-21 NOTE — Telephone Encounter (Signed)
Kathlee Nations called from [facility] that does home INR checks she states that pt's home INR reading was critical value INR=1.4 this was done 12-20-16 they do not do PT with this. She mentioned that she already faxed results over

## 2016-12-27 DIAGNOSIS — R69 Illness, unspecified: Secondary | ICD-10-CM | POA: Diagnosis not present

## 2016-12-28 DIAGNOSIS — R69 Illness, unspecified: Secondary | ICD-10-CM | POA: Diagnosis not present

## 2016-12-28 LAB — POCT INR: INR: 3.1

## 2016-12-29 ENCOUNTER — Ambulatory Visit (INDEPENDENT_AMBULATORY_CARE_PROVIDER_SITE_OTHER): Payer: Medicare HMO | Admitting: Pharmacist

## 2016-12-29 DIAGNOSIS — Z952 Presence of prosthetic heart valve: Secondary | ICD-10-CM

## 2016-12-29 DIAGNOSIS — Z7901 Long term (current) use of anticoagulants: Secondary | ICD-10-CM

## 2017-01-04 DIAGNOSIS — Z0100 Encounter for examination of eyes and vision without abnormal findings: Secondary | ICD-10-CM | POA: Diagnosis not present

## 2017-01-22 DIAGNOSIS — E785 Hyperlipidemia, unspecified: Secondary | ICD-10-CM | POA: Diagnosis not present

## 2017-01-22 DIAGNOSIS — E559 Vitamin D deficiency, unspecified: Secondary | ICD-10-CM | POA: Diagnosis not present

## 2017-01-22 DIAGNOSIS — I10 Essential (primary) hypertension: Secondary | ICD-10-CM | POA: Diagnosis not present

## 2017-01-22 DIAGNOSIS — E291 Testicular hypofunction: Secondary | ICD-10-CM | POA: Diagnosis not present

## 2017-01-22 DIAGNOSIS — Z952 Presence of prosthetic heart valve: Secondary | ICD-10-CM | POA: Diagnosis not present

## 2017-01-29 LAB — POCT INR: INR: 3.6

## 2017-01-30 DIAGNOSIS — E785 Hyperlipidemia, unspecified: Secondary | ICD-10-CM | POA: Diagnosis not present

## 2017-01-30 DIAGNOSIS — G4730 Sleep apnea, unspecified: Secondary | ICD-10-CM | POA: Diagnosis not present

## 2017-01-30 DIAGNOSIS — E559 Vitamin D deficiency, unspecified: Secondary | ICD-10-CM | POA: Diagnosis not present

## 2017-01-30 DIAGNOSIS — Z79899 Other long term (current) drug therapy: Secondary | ICD-10-CM | POA: Diagnosis not present

## 2017-01-30 DIAGNOSIS — E291 Testicular hypofunction: Secondary | ICD-10-CM | POA: Diagnosis not present

## 2017-01-30 DIAGNOSIS — I10 Essential (primary) hypertension: Secondary | ICD-10-CM | POA: Diagnosis not present

## 2017-01-30 DIAGNOSIS — D696 Thrombocytopenia, unspecified: Secondary | ICD-10-CM | POA: Diagnosis not present

## 2017-01-30 DIAGNOSIS — Z952 Presence of prosthetic heart valve: Secondary | ICD-10-CM | POA: Diagnosis not present

## 2017-02-12 ENCOUNTER — Ambulatory Visit (INDEPENDENT_AMBULATORY_CARE_PROVIDER_SITE_OTHER): Payer: Medicare HMO | Admitting: Pharmacist Clinician (PhC)/ Clinical Pharmacy Specialist

## 2017-02-12 DIAGNOSIS — Z952 Presence of prosthetic heart valve: Secondary | ICD-10-CM

## 2017-02-12 DIAGNOSIS — Z7901 Long term (current) use of anticoagulants: Secondary | ICD-10-CM | POA: Diagnosis not present

## 2017-02-24 ENCOUNTER — Other Ambulatory Visit: Payer: Self-pay | Admitting: Cardiovascular Disease

## 2017-02-25 LAB — POCT INR: INR: 3

## 2017-02-26 ENCOUNTER — Ambulatory Visit (INDEPENDENT_AMBULATORY_CARE_PROVIDER_SITE_OTHER): Payer: Medicare HMO | Admitting: Pharmacist Clinician (PhC)/ Clinical Pharmacy Specialist

## 2017-02-26 DIAGNOSIS — I4891 Unspecified atrial fibrillation: Secondary | ICD-10-CM

## 2017-02-26 DIAGNOSIS — Z952 Presence of prosthetic heart valve: Secondary | ICD-10-CM | POA: Diagnosis not present

## 2017-02-26 DIAGNOSIS — Z7901 Long term (current) use of anticoagulants: Secondary | ICD-10-CM

## 2017-02-26 DIAGNOSIS — I9789 Other postprocedural complications and disorders of the circulatory system, not elsewhere classified: Secondary | ICD-10-CM

## 2017-03-29 ENCOUNTER — Ambulatory Visit (INDEPENDENT_AMBULATORY_CARE_PROVIDER_SITE_OTHER): Payer: Medicare HMO | Admitting: Pharmacist Clinician (PhC)/ Clinical Pharmacy Specialist

## 2017-03-29 DIAGNOSIS — Z952 Presence of prosthetic heart valve: Secondary | ICD-10-CM

## 2017-03-29 DIAGNOSIS — Z7901 Long term (current) use of anticoagulants: Secondary | ICD-10-CM | POA: Diagnosis not present

## 2017-03-29 LAB — POCT INR: INR: 2.1

## 2017-04-11 ENCOUNTER — Telehealth: Payer: Self-pay | Admitting: Cardiology

## 2017-04-11 LAB — POCT INR: INR: 5.3

## 2017-04-11 NOTE — Telephone Encounter (Signed)
Received a call from Ms. Denslow about Marcus [name]'s INR level being elevated at 5.3 that was drawn today, 04/11/17. The patient has been taking 15 mg on Mondays and Fridays and 10 mg the other five days of the week.  Was advised to give him 5 mg today, then 10 mg daily and to have his INR re-checked this Friday, 04/13/17.    Kathyrn Drown, NP

## 2017-04-12 ENCOUNTER — Telehealth: Payer: Self-pay | Admitting: Cardiovascular Disease

## 2017-04-12 ENCOUNTER — Ambulatory Visit (INDEPENDENT_AMBULATORY_CARE_PROVIDER_SITE_OTHER): Payer: Medicare HMO | Admitting: Pharmacist Clinician (PhC)/ Clinical Pharmacy Specialist

## 2017-04-12 DIAGNOSIS — Z952 Presence of prosthetic heart valve: Secondary | ICD-10-CM | POA: Diagnosis not present

## 2017-04-12 DIAGNOSIS — Z7901 Long term (current) use of anticoagulants: Secondary | ICD-10-CM | POA: Diagnosis not present

## 2017-04-12 NOTE — Telephone Encounter (Signed)
Closed Encounter  °

## 2017-04-13 ENCOUNTER — Ambulatory Visit (INDEPENDENT_AMBULATORY_CARE_PROVIDER_SITE_OTHER): Payer: Medicare HMO | Admitting: Pharmacist Clinician (PhC)/ Clinical Pharmacy Specialist

## 2017-04-13 DIAGNOSIS — Z952 Presence of prosthetic heart valve: Secondary | ICD-10-CM

## 2017-04-13 DIAGNOSIS — Z7901 Long term (current) use of anticoagulants: Secondary | ICD-10-CM

## 2017-04-13 LAB — POCT INR: INR: 1.9

## 2017-04-20 LAB — POCT INR: INR: 2.7

## 2017-04-23 ENCOUNTER — Ambulatory Visit (INDEPENDENT_AMBULATORY_CARE_PROVIDER_SITE_OTHER): Payer: Medicare HMO | Admitting: Pharmacist Clinician (PhC)/ Clinical Pharmacy Specialist

## 2017-04-23 DIAGNOSIS — Z7901 Long term (current) use of anticoagulants: Secondary | ICD-10-CM | POA: Diagnosis not present

## 2017-04-23 DIAGNOSIS — Z952 Presence of prosthetic heart valve: Secondary | ICD-10-CM | POA: Diagnosis not present

## 2017-05-04 ENCOUNTER — Ambulatory Visit (INDEPENDENT_AMBULATORY_CARE_PROVIDER_SITE_OTHER): Payer: Medicare HMO | Admitting: Pharmacist

## 2017-05-04 DIAGNOSIS — Z7901 Long term (current) use of anticoagulants: Secondary | ICD-10-CM

## 2017-05-04 DIAGNOSIS — Z952 Presence of prosthetic heart valve: Secondary | ICD-10-CM

## 2017-05-04 LAB — POCT INR: INR: 2.3

## 2017-05-09 DIAGNOSIS — R140 Abdominal distension (gaseous): Secondary | ICD-10-CM | POA: Diagnosis not present

## 2017-05-23 LAB — POCT INR: INR: 3.2

## 2017-05-24 ENCOUNTER — Ambulatory Visit (INDEPENDENT_AMBULATORY_CARE_PROVIDER_SITE_OTHER): Payer: Medicare HMO | Admitting: Pharmacist

## 2017-05-24 DIAGNOSIS — Z7901 Long term (current) use of anticoagulants: Secondary | ICD-10-CM | POA: Diagnosis not present

## 2017-05-24 DIAGNOSIS — Z952 Presence of prosthetic heart valve: Secondary | ICD-10-CM

## 2017-06-14 ENCOUNTER — Other Ambulatory Visit: Payer: Self-pay | Admitting: Cardiovascular Disease

## 2017-06-14 DIAGNOSIS — R69 Illness, unspecified: Secondary | ICD-10-CM | POA: Diagnosis not present

## 2017-06-25 ENCOUNTER — Ambulatory Visit (INDEPENDENT_AMBULATORY_CARE_PROVIDER_SITE_OTHER): Payer: Medicare HMO | Admitting: Pharmacist

## 2017-06-25 DIAGNOSIS — Z7901 Long term (current) use of anticoagulants: Secondary | ICD-10-CM | POA: Diagnosis not present

## 2017-06-25 DIAGNOSIS — Z952 Presence of prosthetic heart valve: Secondary | ICD-10-CM

## 2017-06-25 LAB — POCT INR: INR: 3

## 2017-06-28 DIAGNOSIS — Z872 Personal history of diseases of the skin and subcutaneous tissue: Secondary | ICD-10-CM | POA: Diagnosis not present

## 2017-06-28 DIAGNOSIS — L814 Other melanin hyperpigmentation: Secondary | ICD-10-CM | POA: Diagnosis not present

## 2017-06-28 DIAGNOSIS — D2371 Other benign neoplasm of skin of right lower limb, including hip: Secondary | ICD-10-CM | POA: Diagnosis not present

## 2017-06-28 DIAGNOSIS — D692 Other nonthrombocytopenic purpura: Secondary | ICD-10-CM | POA: Diagnosis not present

## 2017-06-28 DIAGNOSIS — Z09 Encounter for follow-up examination after completed treatment for conditions other than malignant neoplasm: Secondary | ICD-10-CM | POA: Diagnosis not present

## 2017-06-28 DIAGNOSIS — L821 Other seborrheic keratosis: Secondary | ICD-10-CM | POA: Diagnosis not present

## 2017-07-10 DIAGNOSIS — H9190 Unspecified hearing loss, unspecified ear: Secondary | ICD-10-CM | POA: Diagnosis not present

## 2017-07-13 ENCOUNTER — Ambulatory Visit (INDEPENDENT_AMBULATORY_CARE_PROVIDER_SITE_OTHER): Payer: Medicare HMO | Admitting: Pharmacist Clinician (PhC)/ Clinical Pharmacy Specialist

## 2017-07-13 DIAGNOSIS — Z952 Presence of prosthetic heart valve: Secondary | ICD-10-CM | POA: Diagnosis not present

## 2017-07-13 DIAGNOSIS — Z7901 Long term (current) use of anticoagulants: Secondary | ICD-10-CM

## 2017-07-13 LAB — POCT INR: INR: 2.3

## 2017-07-19 DIAGNOSIS — E785 Hyperlipidemia, unspecified: Secondary | ICD-10-CM | POA: Diagnosis not present

## 2017-07-19 DIAGNOSIS — H9190 Unspecified hearing loss, unspecified ear: Secondary | ICD-10-CM | POA: Diagnosis not present

## 2017-07-19 DIAGNOSIS — E291 Testicular hypofunction: Secondary | ICD-10-CM | POA: Diagnosis not present

## 2017-07-19 DIAGNOSIS — I10 Essential (primary) hypertension: Secondary | ICD-10-CM | POA: Diagnosis not present

## 2017-07-19 DIAGNOSIS — Z79899 Other long term (current) drug therapy: Secondary | ICD-10-CM | POA: Diagnosis not present

## 2017-08-02 DIAGNOSIS — R69 Illness, unspecified: Secondary | ICD-10-CM | POA: Diagnosis not present

## 2017-08-09 ENCOUNTER — Ambulatory Visit (INDEPENDENT_AMBULATORY_CARE_PROVIDER_SITE_OTHER): Payer: Medicare HMO | Admitting: Pharmacist Clinician (PhC)/ Clinical Pharmacy Specialist

## 2017-08-09 DIAGNOSIS — I9789 Other postprocedural complications and disorders of the circulatory system, not elsewhere classified: Secondary | ICD-10-CM | POA: Diagnosis not present

## 2017-08-09 DIAGNOSIS — I4891 Unspecified atrial fibrillation: Secondary | ICD-10-CM | POA: Diagnosis not present

## 2017-08-09 DIAGNOSIS — Z7901 Long term (current) use of anticoagulants: Secondary | ICD-10-CM | POA: Diagnosis not present

## 2017-08-09 DIAGNOSIS — Z952 Presence of prosthetic heart valve: Secondary | ICD-10-CM | POA: Diagnosis not present

## 2017-08-09 LAB — POCT INR: INR: 4

## 2017-08-09 NOTE — Progress Notes (Signed)
. Patient ID: Marcus [name], male   DOB: 07-Oct-1945, 72 y.o.   MRN: [id number]    Cardiology Office Note    Date:  08/10/2017   ID:  Marcus [name], DOB September 09, 1945, MRN [id number]  PCP:  Norberto Sorenson, MD  Cardiologist:   [name], MD   Chief Complaint  Patient presents with  . Follow-up    states feeling a burning on his left side of his neck    History of Present Illness:  Marcus [name] is a 72 y.o. male who presents for follow-up of aortic valve mechanical prosthesis, previous repair of ascending aortic aneurysm, severe asymptomatic sinus bradycardia and coronary artery disease with previous bypass surgery (Severe AI - Bentall 23 mm [employer] Jude AVR, CABG x 2 - SVG-RCA and SVG-OM 2008, [facility]).  He retired about a year ago but remains physically active.  He complains of some burning discomfort overlying the posterior surface of his left shoulder that sounds distinctly neuropathic.  He does not have any exertional chest or shoulder discomfort.  He has noticed however some decrease in his stamina and some worsened shortness of breath with activity.  He is markedly bradycardic but this is a long-standing phenomenon.  His heart rate is almost always in the 40s. He is on chronic warfarin anticoagulation. He has a history of a right leg Baker's cyst and he feels that it has grown again.  After he left the hospital we got his most recent lipid profile collected April 12: Total cholesterol 162, HDL 47, triglycerides 75, LDL 99.  He is on maximum dose atorvastatin.  Additional recent labs include glucose 86, creatinine 0.8, potassium 4.6 and normal liver function tests.  He reports 100% compliance with CPAP for sleep apnea. He monitors warfarin anticoagulation with a home monitor, supervised by our Coumadin clinic.  He had normal echocardiograms in 2014 and 2016 with a mean aortic valve prosthesis gradient of 14 mmHg.  CT angiograms have shown stable dimension of the aortic root to 29 mm.  We  have decided to follow-up on an every five-year basis so he will be due for a CT angiogram of the aorta next year.   Past Medical History:  Diagnosis Date  . Atrial fibrillation ([place])   . Dyspnea    with exertion   . Hypertension   . Obstructive sleep apnea 10/25/2007    no longer on cpap  . S/P AVR    2D ECHO, 04/25/2011 - EF >55%, Right ventricle-mild-moderately dilated  . Swelling of limb    LEA VENOUS, 08/22/2009 - no evidence of deep vein or superficial thrombosis; partially rupturing Baker's Cyst    Past Surgical History:  Procedure Laterality Date  . CARDIAC CATHETERIZATION Bilateral 05/10/2007   Significant 1-vessel disease, severely dilated aortic root with moderate severe aortic insufficiency  . CARDIAC SURGERY    . CARDIOVERSION  08/02/2007   150 Joule biphasic shock with restoration of sinus rhythm. Heart rate 60.  . cataract surgery     . COLONOSCOPY WITH PROPOFOL N/A 12/15/2016   Procedure: COLONOSCOPY WITH PROPOFOL;  Surgeon: Carol Ada, MD;  Location: [facility];  Service: Endoscopy;  Laterality: N/A;  . NM MYOVIEW LTD  04/08/2007   No evidence of inducible myocardial ischemia  . THYROIDECTOMY, PARTIAL    . torn meniscus in right knee surgery       Current Medications: Outpatient Medications Prior to Visit  Medication Sig Dispense Refill  . Ascorbic Acid (VITAMIN C) 1000 MG tablet Take  1,000 mg by mouth daily.    Marland Kitchen aspirin EC 81 MG tablet Take 81 mg by mouth daily.    Marland Kitchen atorvastatin (LIPITOR) 80 MG tablet TAKE 1 TABLET BY MOUTH EVERY DAY 90 tablet 2  . CALCIUM PO Take 1,200 mg by mouth daily.    . carboxymethylcellulose (REFRESH PLUS) 0.5 % SOLN Place 1 drop into both eyes daily as needed (dry eyes).     . cetirizine (ZYRTEC) 10 MG tablet Take 10 mg by mouth daily.    . cholecalciferol (VITAMIN D) 1000 units tablet Take 1,000 Units by mouth daily.    Marland Kitchen CINNAMON PO Take 1,000 mg by mouth daily.     . clindamycin (CLEOCIN) 300 MG capsule Take 2 tablets (600 mg  total) 30-60 minutes prior to dental or medical procedure. 2 capsule 5  . Multiple Vitamins-Minerals (MULTIVITAMIN WITH MINERALS) tablet Take 1 tablet by mouth daily.    . Omega-3 Krill Oil 500 MG CAPS Take 500 mg by mouth daily.     . ramipril (ALTACE) 10 MG capsule TAKE 1 CAPSULE BY MOUTH EVERY DAY 90 capsule 2  . vitamin B-12 (CYANOCOBALAMIN) 1000 MCG tablet Take 1,000 mcg by mouth daily.    Marland Kitchen warfarin (COUMADIN) 10 MG tablet TAKE 1 TO 1 AND 1/2 TABLET BY MOUTH DAILY AS DIRECTED BY COUMADIN CLINIC 135 tablet 0   No facility-administered medications prior to visit.      Allergies:   Amlodipine; Tetanus toxoids; Crestor [rosuvastatin]; and Penicillins   Social History   Socioeconomic History  . Marital status: Married    Spouse name: Not on file  . Number of children: Not on file  . Years of education: Not on file  . Highest education level: Not on file  Occupational History  . Not on file  Social Needs  . Financial resource strain: Not on file  . Food insecurity:    Worry: Not on file    Inability: Not on file  . Transportation needs:    Medical: Not on file    Non-medical: Not on file  Tobacco Use  . Smoking status: Former Smoker    Types: Cigarettes    Last attempt to quit: 04/11/1987    Years since quitting: 30.3  . Smokeless tobacco: Never Used  Substance and Sexual Activity  . Alcohol use: No  . Drug use: No  . Sexual activity: Not on file  Lifestyle  . Physical activity:    Days per week: Not on file    Minutes per session: Not on file  . Stress: Not on file  Relationships  . Social connections:    Talks on phone: Not on file    Gets together: Not on file    Attends religious service: Not on file    Active member of club or organization: Not on file    Attends meetings of clubs or organizations: Not on file    Relationship status: Not on file  Other Topics Concern  . Not on file  Social History Narrative  . Not on file     Family History:  The  patient's family history includes Cancer in his father.   ROS:   Please see the history of present illness.    ROS All other systems reviewed and are negative.   PHYSICAL EXAM:   VS:  BP 114/82   Pulse (!) 47   Ht 5\' 11"  (1.803 m)   Wt 184 lb 12.8 oz (83.8 kg)   BMI 25.77  kg/m      General: Alert, oriented x3, no distress, slender and fit. Head: no evidence of trauma, PERRL, EOMI, no exophtalmos or lid lag, no myxedema, no xanthelasma; normal ears, nose and oropharynx Neck: normal jugular venous pulsations and no hepatojugular reflux; brisk carotid pulses without delay and no carotid bruits Chest: clear to auscultation, no signs of consolidation by percussion or palpation, normal fremitus, symmetrical and full respiratory excursions Cardiovascular: normal position and quality of the apical impulse, regular rhythm, normal first and second heart sounds, prosthetic clicks, 2/6 early peaking aortic ejection murmur, no diastolic murmurs, rubs or gallops Abdomen: no tenderness or distention, no masses by palpation, no abnormal pulsatility or arterial bruits, normal bowel sounds, no hepatosplenomegaly Extremities: no clubbing, cyanosis or edema; 2+ radial, ulnar and brachial pulses bilaterally; 2+ right femoral, posterior tibial and dorsalis pedis pulses; 2+ left femoral, posterior tibial and dorsalis pedis pulses; no subclavian or femoral bruits Neurological: grossly nonfocal Psych: Normal mood and affect   Wt Readings from Last 3 Encounters:  08/10/17 184 lb 12.8 oz (83.8 kg)  12/15/16 183 lb (83 kg)  07/14/16 197 lb (89.4 kg)      Studies/Labs Reviewed:   EKG:  EKG is ordered today.  The ekg ordered today demonstrates sinus bradycardia, old right bundle branch block (QRS 134 ms), QTC 415 ms   ASSESSMENT:    1. Coronary artery disease involving coronary bypass graft of native heart without angina pectoris   2. HTN, goal below 130/80   3. Pure hypercholesterolemia   4.  Bradycardia, severe sinus   5. Postoperative atrial fibrillation (HCC)   6. OSA (obstructive sleep apnea)   7. S/P thoracic aortic aneurysm repair   8. H/O aortic valve replacement   9. Long term current use of anticoagulant therapy   10. Shortness of breath   11. Thoracic aortic aneurysm without rupture ([place])      PLAN:  In order of problems listed above:  1. CAD s/p CABG: Preserved left ventricular systolic function, no angina pectoris, risk factors generally well addressed.He has excellent functional status.  He had a normal stress echo 04/2015.  2. HTN: Very well controlled 3. HLP: Ideally would shoot for a target LDL under 70.  Did not have his lipid profile available at the time of his clinic appointment so did not have an opportunity to discuss adding PCSK9 inhibitors. 4. Sinus bradycardia: is a chronic abnormality and has never been associated with symptoms in the past. 5. Postop AF: Required cardioversion in 2009 without any clinically evident recurrence since that time. He is on anticoagulation anyway for his prosthetic aortic valve.  No clinical recurrence. 6. OSA: Reports 100% compliance with CPAP.  Denies hypersomnolence and reports good results with this treatment. 7. Ao aneurysm s/p Bentall repair: No evidence of other areas of aortic dilation on 2015 CT angiogram.  Plan to repeat a CT angiogram next year, in 2020 8. Mechanical AVR: Recheck echo in the next few weeks 9. Warfarin: Not serious bleeding complications or embolic events. Has a home INR monitor. 10. Exertional dyspnea: Sounds like he has excellent functional status, but he complains of some shortness of breath, will check echocardiogram. 11. Left shoulder pain sounds neuralgia/neuropathic, consider cervical spine disease.  It occurs at rest when he lies in bed and sounds positional.  It is not compatible with angina pectoris.    Medication Adjustments/Labs and Tests Ordered: Current medicines are reviewed at  length with the patient today.  Concerns regarding medicines  are outlined above.  Medication changes, Labs and Tests ordered today are listed in the Patient Instructions below. Patient Instructions  Medication Instructions: Dr Sallyanne Kuster recommends that you continue on your current medications as directed. Please refer to the Current Medication list given to you today.  Labwork: NONE ORDERED  Testing/Procedures: 1. Echocardiogram now - Your physician has requested that you have an echocardiogram. Echocardiography is a painless test that uses sound waves to create images of your heart. It provides your doctor with information about the size and shape of your heart and how well your heart's chambers and valves are working. This procedure takes approximately one hour. There are no restrictions for this procedure.  2. CT Angiogram Chest Aorta in ONE YEAR  These tests will be performed at our [facility] location [place], [address] [place] [address] [phone]  Follow-up: Dr Sallyanne Kuster recommends that you schedule a follow-up appointment in 12 months. You will receive a reminder letter in the mail two months in advance. If you don't receive a letter, please call our office to schedule the follow-up appointment.  If you need a refill on your cardiac medications before your next appointment, please call your pharmacy.    Signed, [name], MD  08/10/2017 7:45 PM    [address], [place], [place]  [address] Phone: [phone]; Fax: [phone]  \

## 2017-08-10 ENCOUNTER — Encounter: Payer: Self-pay | Admitting: Cardiovascular Disease

## 2017-08-10 ENCOUNTER — Encounter

## 2017-08-10 ENCOUNTER — Ambulatory Visit: Payer: Medicare HMO | Admitting: Cardiovascular Disease

## 2017-08-10 VITALS — BP 114/82 | HR 47 | Ht 71.0 in | Wt 184.8 lb

## 2017-08-10 DIAGNOSIS — Z7901 Long term (current) use of anticoagulants: Secondary | ICD-10-CM | POA: Diagnosis not present

## 2017-08-10 DIAGNOSIS — R0602 Shortness of breath: Secondary | ICD-10-CM

## 2017-08-10 DIAGNOSIS — I25810 Atherosclerosis of coronary artery bypass graft(s) without angina pectoris: Secondary | ICD-10-CM | POA: Diagnosis not present

## 2017-08-10 DIAGNOSIS — I712 Thoracic aortic aneurysm, without rupture, unspecified: Secondary | ICD-10-CM

## 2017-08-10 DIAGNOSIS — G4733 Obstructive sleep apnea (adult) (pediatric): Secondary | ICD-10-CM

## 2017-08-10 DIAGNOSIS — Z952 Presence of prosthetic heart valve: Secondary | ICD-10-CM | POA: Diagnosis not present

## 2017-08-10 DIAGNOSIS — I9789 Other postprocedural complications and disorders of the circulatory system, not elsewhere classified: Secondary | ICD-10-CM

## 2017-08-10 DIAGNOSIS — R001 Bradycardia, unspecified: Secondary | ICD-10-CM

## 2017-08-10 DIAGNOSIS — I4891 Unspecified atrial fibrillation: Secondary | ICD-10-CM

## 2017-08-10 DIAGNOSIS — Z8679 Personal history of other diseases of the circulatory system: Secondary | ICD-10-CM | POA: Diagnosis not present

## 2017-08-10 DIAGNOSIS — Z9889 Other specified postprocedural states: Secondary | ICD-10-CM | POA: Diagnosis not present

## 2017-08-10 DIAGNOSIS — I10 Essential (primary) hypertension: Secondary | ICD-10-CM

## 2017-08-10 DIAGNOSIS — E7800 Pure hypercholesterolemia, unspecified: Secondary | ICD-10-CM

## 2017-08-10 NOTE — Patient Instructions (Signed)
Medication Instructions: Dr Sallyanne Kuster recommends that you continue on your current medications as directed. Please refer to the Current Medication list given to you today.  Labwork: NONE ORDERED  Testing/Procedures: 1. Echocardiogram now - Your physician has requested that you have an echocardiogram. Echocardiography is a painless test that uses sound waves to create images of your heart. It provides your doctor with information about the size and shape of your heart and how well your heart's chambers and valves are working. This procedure takes approximately one hour. There are no restrictions for this procedure.  2. CT Angiogram Chest Aorta in ONE YEAR  These tests will be performed at our [facility] location [place], [address] [place] [address] [phone]  Follow-up: Dr Sallyanne Kuster recommends that you schedule a follow-up appointment in 12 months. You will receive a reminder letter in the mail two months in advance. If you don't receive a letter, please call our office to schedule the follow-up appointment.  If you need a refill on your cardiac medications before your next appointment, please call your pharmacy.

## 2017-08-13 DIAGNOSIS — Z0000 Encounter for general adult medical examination without abnormal findings: Secondary | ICD-10-CM | POA: Diagnosis not present

## 2017-08-13 DIAGNOSIS — E291 Testicular hypofunction: Secondary | ICD-10-CM | POA: Diagnosis not present

## 2017-08-13 DIAGNOSIS — E785 Hyperlipidemia, unspecified: Secondary | ICD-10-CM | POA: Diagnosis not present

## 2017-08-13 DIAGNOSIS — D696 Thrombocytopenia, unspecified: Secondary | ICD-10-CM | POA: Diagnosis not present

## 2017-08-13 DIAGNOSIS — I10 Essential (primary) hypertension: Secondary | ICD-10-CM | POA: Diagnosis not present

## 2017-08-13 DIAGNOSIS — Z79899 Other long term (current) drug therapy: Secondary | ICD-10-CM | POA: Diagnosis not present

## 2017-08-13 DIAGNOSIS — E559 Vitamin D deficiency, unspecified: Secondary | ICD-10-CM | POA: Diagnosis not present

## 2017-08-13 DIAGNOSIS — Z125 Encounter for screening for malignant neoplasm of prostate: Secondary | ICD-10-CM | POA: Diagnosis not present

## 2017-08-14 ENCOUNTER — Telehealth: Payer: Self-pay | Admitting: Cardiology

## 2017-08-14 NOTE — Telephone Encounter (Signed)
New Message:       *STAT* If patient is at the pharmacy, call can be transferred to refill team.   1. Which medications need to be refilled? (please list name of each medication and dose if known) clindamycin (CLEOCIN) 300 MG capsule  2. Which pharmacy/location (including street and city if local pharmacy) is medication to be sent to?Take 2 tablets (600 mg total) 30-60 minutes prior to dental or medical procedure.  3. Do they need a 30 day or 90 day supply? 10   Dr. Sallyanne Kuster

## 2017-08-15 MED ORDER — CLINDAMYCIN HCL 300 MG PO CAPS: ORAL_CAPSULE | ORAL | 1 refills | Status: DC

## 2017-08-15 NOTE — Telephone Encounter (Signed)
Rx(s) sent to pharmacy electronically. Patient has had AVR

## 2017-08-16 ENCOUNTER — Other Ambulatory Visit (HOSPITAL_COMMUNITY): Payer: Medicare HMO

## 2017-08-20 ENCOUNTER — Other Ambulatory Visit: Payer: Self-pay

## 2017-08-20 ENCOUNTER — Ambulatory Visit (HOSPITAL_COMMUNITY): Payer: Medicare HMO | Attending: Internal Medicine

## 2017-08-20 DIAGNOSIS — I088 Other rheumatic multiple valve diseases: Secondary | ICD-10-CM | POA: Diagnosis not present

## 2017-08-20 DIAGNOSIS — Z9889 Other specified postprocedural states: Secondary | ICD-10-CM | POA: Insufficient documentation

## 2017-08-20 DIAGNOSIS — R0602 Shortness of breath: Secondary | ICD-10-CM | POA: Diagnosis not present

## 2017-08-20 DIAGNOSIS — G4733 Obstructive sleep apnea (adult) (pediatric): Secondary | ICD-10-CM | POA: Diagnosis not present

## 2017-08-20 DIAGNOSIS — Z952 Presence of prosthetic heart valve: Secondary | ICD-10-CM | POA: Insufficient documentation

## 2017-08-20 DIAGNOSIS — I4891 Unspecified atrial fibrillation: Secondary | ICD-10-CM | POA: Diagnosis not present

## 2017-08-20 DIAGNOSIS — Z8679 Personal history of other diseases of the circulatory system: Secondary | ICD-10-CM | POA: Insufficient documentation

## 2017-08-29 ENCOUNTER — Telehealth: Payer: Self-pay | Admitting: Cardiovascular Disease

## 2017-08-29 LAB — POCT INR: INR: 3.9 — AB (ref 2.0–3.0)

## 2017-08-29 NOTE — Telephone Encounter (Signed)
Return call to pt who states he was calling to confirm whether Dr. Sallyanne Kuster received lab results from his PCP. Pt states Dr. Loletha Grayer is wanting to change his cholesterol medication but awaiting lab results.   Routed to MD and Nurse.

## 2017-08-29 NOTE — Telephone Encounter (Signed)
New Message:   Pt wants to know if you received his lab results from about 2 weeks or more,he had it primary doctor.He says he needs to know about taking his medicine.

## 2017-08-30 NOTE — Telephone Encounter (Signed)
Spoke with pt and advised that Dr. Sallyanne Kuster received some of his labs but not the lipids. Informed that his pcp has been contacted for results. Pt states he will refax labs as well.

## 2017-08-30 NOTE — Telephone Encounter (Signed)
We got some labs, but not the lipids. We have called them back for that [facility]

## 2017-08-31 ENCOUNTER — Ambulatory Visit (INDEPENDENT_AMBULATORY_CARE_PROVIDER_SITE_OTHER): Payer: Medicare HMO | Admitting: Pharmacist Clinician (PhC)/ Clinical Pharmacy Specialist

## 2017-08-31 ENCOUNTER — Other Ambulatory Visit: Payer: Self-pay | Admitting: Cardiovascular Disease

## 2017-08-31 DIAGNOSIS — I4891 Unspecified atrial fibrillation: Secondary | ICD-10-CM | POA: Diagnosis not present

## 2017-08-31 DIAGNOSIS — Z952 Presence of prosthetic heart valve: Secondary | ICD-10-CM | POA: Diagnosis not present

## 2017-08-31 DIAGNOSIS — I9789 Other postprocedural complications and disorders of the circulatory system, not elsewhere classified: Secondary | ICD-10-CM

## 2017-08-31 DIAGNOSIS — Z7901 Long term (current) use of anticoagulants: Secondary | ICD-10-CM | POA: Diagnosis not present

## 2017-08-31 NOTE — Telephone Encounter (Signed)
Rx request sent to pharmacy.  

## 2017-09-05 ENCOUNTER — Telehealth: Payer: Self-pay | Admitting: Cardiovascular Disease

## 2017-09-05 NOTE — Telephone Encounter (Signed)
Spoke with patient.  He wanted to know if dietary changes would get him to goal.  While I encouraged that he make changes to improve diet, it would not likely get him the 30% drop we need.  He is agreeable to see if PSCK-9i are affordable.

## 2017-09-05 NOTE — Telephone Encounter (Signed)
New Message:       Pt is calling to speak with Erasmo Downer.

## 2017-09-11 DIAGNOSIS — R69 Illness, unspecified: Secondary | ICD-10-CM | POA: Diagnosis not present

## 2017-09-12 ENCOUNTER — Other Ambulatory Visit: Payer: Self-pay | Admitting: Pharmacist Clinician (PhC)/ Clinical Pharmacy Specialist

## 2017-09-12 MED ORDER — EVOLOCUMAB 140 MG/ML ~~LOC~~ SOAJ: 140.0000 mg | SUBCUTANEOUS | 12 refills | Status: DC

## 2017-09-12 MED ORDER — CLINDAMYCIN HCL 300 MG PO CAPS: ORAL_CAPSULE | ORAL | 1 refills | Status: DC

## 2017-09-18 LAB — POCT INR: INR: 3.9 — AB (ref 2.0–3.0)

## 2017-09-19 ENCOUNTER — Ambulatory Visit (INDEPENDENT_AMBULATORY_CARE_PROVIDER_SITE_OTHER): Payer: Medicare HMO | Admitting: Pharmacist

## 2017-09-19 DIAGNOSIS — Z7901 Long term (current) use of anticoagulants: Secondary | ICD-10-CM

## 2017-09-19 DIAGNOSIS — Z952 Presence of prosthetic heart valve: Secondary | ICD-10-CM

## 2017-09-27 DIAGNOSIS — Z0000 Encounter for general adult medical examination without abnormal findings: Secondary | ICD-10-CM | POA: Diagnosis not present

## 2017-09-27 DIAGNOSIS — Z1389 Encounter for screening for other disorder: Secondary | ICD-10-CM | POA: Diagnosis not present

## 2017-09-27 DIAGNOSIS — Z6826 Body mass index (BMI) 26.0-26.9, adult: Secondary | ICD-10-CM | POA: Diagnosis not present

## 2017-09-27 DIAGNOSIS — Z7689 Persons encountering health services in other specified circumstances: Secondary | ICD-10-CM | POA: Diagnosis not present

## 2017-10-03 ENCOUNTER — Ambulatory Visit (INDEPENDENT_AMBULATORY_CARE_PROVIDER_SITE_OTHER): Payer: Medicare HMO | Admitting: Pharmacist Clinician (PhC)/ Clinical Pharmacy Specialist

## 2017-10-03 DIAGNOSIS — Z952 Presence of prosthetic heart valve: Secondary | ICD-10-CM

## 2017-10-03 DIAGNOSIS — Z7901 Long term (current) use of anticoagulants: Secondary | ICD-10-CM

## 2017-10-03 LAB — POCT INR: INR: 3.1 — AB (ref 2.0–3.0)

## 2017-10-10 ENCOUNTER — Other Ambulatory Visit: Payer: Self-pay | Admitting: Cardiovascular Disease

## 2017-10-23 LAB — POCT INR: INR: 4.3 — AB (ref 2.0–3.0)

## 2017-10-24 ENCOUNTER — Ambulatory Visit (INDEPENDENT_AMBULATORY_CARE_PROVIDER_SITE_OTHER): Payer: Medicare HMO | Admitting: Pharmacist

## 2017-10-24 DIAGNOSIS — Z7901 Long term (current) use of anticoagulants: Secondary | ICD-10-CM

## 2017-10-24 DIAGNOSIS — Z952 Presence of prosthetic heart valve: Secondary | ICD-10-CM | POA: Diagnosis not present

## 2017-10-25 DIAGNOSIS — Z952 Presence of prosthetic heart valve: Secondary | ICD-10-CM | POA: Diagnosis not present

## 2017-11-05 LAB — PROTIME-INR: INR: 1.9 — AB (ref <=1.1)

## 2017-11-06 ENCOUNTER — Telehealth: Payer: Self-pay | Admitting: Cardiovascular Disease

## 2017-11-06 ENCOUNTER — Ambulatory Visit (INDEPENDENT_AMBULATORY_CARE_PROVIDER_SITE_OTHER): Payer: Medicare HMO | Admitting: Pharmacist

## 2017-11-06 DIAGNOSIS — Z952 Presence of prosthetic heart valve: Secondary | ICD-10-CM

## 2017-11-06 DIAGNOSIS — Z7901 Long term (current) use of anticoagulants: Secondary | ICD-10-CM

## 2017-11-06 MED ORDER — COUMADIN 10 MG PO TABS: ORAL_TABLET | ORAL | 5 refills | Status: DC

## 2017-11-06 NOTE — Telephone Encounter (Signed)
Please see anti-coagulation note for details.

## 2017-11-06 NOTE — Telephone Encounter (Signed)
New message;   Patient wife calling about a INR report.

## 2017-11-06 NOTE — Telephone Encounter (Signed)
Spoke with wife and INR 1.9 checked on 11/05/17 at 9:45 pm. Will forward to Pharm D for review

## 2017-11-20 LAB — POCT INR: INR: 2.7 (ref 2.0–3.0)

## 2017-11-21 ENCOUNTER — Ambulatory Visit (INDEPENDENT_AMBULATORY_CARE_PROVIDER_SITE_OTHER): Payer: Medicare HMO | Admitting: Pharmacist

## 2017-11-21 DIAGNOSIS — Z7901 Long term (current) use of anticoagulants: Secondary | ICD-10-CM | POA: Diagnosis not present

## 2017-11-21 DIAGNOSIS — Z952 Presence of prosthetic heart valve: Secondary | ICD-10-CM

## 2017-12-04 ENCOUNTER — Ambulatory Visit (INDEPENDENT_AMBULATORY_CARE_PROVIDER_SITE_OTHER): Payer: Medicare HMO | Admitting: Pharmacist

## 2017-12-04 DIAGNOSIS — Z952 Presence of prosthetic heart valve: Secondary | ICD-10-CM

## 2017-12-04 DIAGNOSIS — Z7901 Long term (current) use of anticoagulants: Secondary | ICD-10-CM

## 2017-12-04 LAB — POCT INR: INR: 3.1 — AB (ref 2.0–3.0)

## 2017-12-11 DIAGNOSIS — R69 Illness, unspecified: Secondary | ICD-10-CM | POA: Diagnosis not present

## 2017-12-25 DIAGNOSIS — H5000 Unspecified esotropia: Secondary | ICD-10-CM | POA: Diagnosis not present

## 2017-12-25 DIAGNOSIS — H353132 Nonexudative age-related macular degeneration, bilateral, intermediate dry stage: Secondary | ICD-10-CM | POA: Diagnosis not present

## 2017-12-25 DIAGNOSIS — Z961 Presence of intraocular lens: Secondary | ICD-10-CM | POA: Diagnosis not present

## 2017-12-25 DIAGNOSIS — H35033 Hypertensive retinopathy, bilateral: Secondary | ICD-10-CM | POA: Diagnosis not present

## 2018-01-03 LAB — POCT INR: INR: 3.8 — AB (ref 2.0–3.0)

## 2018-01-04 ENCOUNTER — Ambulatory Visit (INDEPENDENT_AMBULATORY_CARE_PROVIDER_SITE_OTHER): Payer: Medicare HMO | Admitting: Pharmacist Clinician (PhC)/ Clinical Pharmacy Specialist

## 2018-01-04 DIAGNOSIS — Z7901 Long term (current) use of anticoagulants: Secondary | ICD-10-CM

## 2018-01-04 DIAGNOSIS — Z952 Presence of prosthetic heart valve: Secondary | ICD-10-CM | POA: Diagnosis not present

## 2018-01-09 DIAGNOSIS — R69 Illness, unspecified: Secondary | ICD-10-CM | POA: Diagnosis not present

## 2018-01-28 DIAGNOSIS — Z79899 Other long term (current) drug therapy: Secondary | ICD-10-CM | POA: Diagnosis not present

## 2018-01-28 DIAGNOSIS — E785 Hyperlipidemia, unspecified: Secondary | ICD-10-CM | POA: Diagnosis not present

## 2018-01-28 DIAGNOSIS — I10 Essential (primary) hypertension: Secondary | ICD-10-CM | POA: Diagnosis not present

## 2018-01-28 DIAGNOSIS — E291 Testicular hypofunction: Secondary | ICD-10-CM | POA: Diagnosis not present

## 2018-01-29 LAB — POCT INR: INR: 3.9 — AB (ref 2.0–3.0)

## 2018-01-30 ENCOUNTER — Other Ambulatory Visit: Payer: Self-pay | Admitting: Cardiovascular Disease

## 2018-01-30 ENCOUNTER — Ambulatory Visit (INDEPENDENT_AMBULATORY_CARE_PROVIDER_SITE_OTHER): Payer: Medicare HMO | Admitting: Pharmacist

## 2018-01-30 DIAGNOSIS — Z7901 Long term (current) use of anticoagulants: Secondary | ICD-10-CM

## 2018-01-30 DIAGNOSIS — Z952 Presence of prosthetic heart valve: Secondary | ICD-10-CM | POA: Diagnosis not present

## 2018-02-03 ENCOUNTER — Telehealth: Payer: Self-pay | Admitting: Pharmacist Clinician (PhC)/ Clinical Pharmacy Specialist

## 2018-02-03 DIAGNOSIS — E7800 Pure hypercholesterolemia, unspecified: Secondary | ICD-10-CM

## 2018-02-03 NOTE — Telephone Encounter (Signed)
Labs to continue Repatha PA

## 2018-02-11 DIAGNOSIS — Z79899 Other long term (current) drug therapy: Secondary | ICD-10-CM | POA: Diagnosis not present

## 2018-02-11 DIAGNOSIS — E291 Testicular hypofunction: Secondary | ICD-10-CM | POA: Diagnosis not present

## 2018-02-11 DIAGNOSIS — E7800 Pure hypercholesterolemia, unspecified: Secondary | ICD-10-CM | POA: Diagnosis not present

## 2018-02-11 DIAGNOSIS — I10 Essential (primary) hypertension: Secondary | ICD-10-CM | POA: Diagnosis not present

## 2018-02-13 LAB — POCT INR: INR: 4.1 — AB (ref 2.0–3.0)

## 2018-02-14 ENCOUNTER — Ambulatory Visit: Payer: Self-pay | Admitting: Pharmacist

## 2018-02-14 DIAGNOSIS — Z7901 Long term (current) use of anticoagulants: Secondary | ICD-10-CM

## 2018-02-14 DIAGNOSIS — Z952 Presence of prosthetic heart valve: Secondary | ICD-10-CM

## 2018-03-02 LAB — POCT INR: INR: 2.6 (ref 2.0–3.0)

## 2018-03-04 ENCOUNTER — Ambulatory Visit (INDEPENDENT_AMBULATORY_CARE_PROVIDER_SITE_OTHER): Payer: Medicare HMO | Admitting: Pharmacist Clinician (PhC)/ Clinical Pharmacy Specialist

## 2018-03-04 DIAGNOSIS — Z7901 Long term (current) use of anticoagulants: Secondary | ICD-10-CM | POA: Diagnosis not present

## 2018-03-04 DIAGNOSIS — Z952 Presence of prosthetic heart valve: Secondary | ICD-10-CM | POA: Diagnosis not present

## 2018-03-13 DIAGNOSIS — R1084 Generalized abdominal pain: Secondary | ICD-10-CM | POA: Diagnosis not present

## 2018-03-13 DIAGNOSIS — R197 Diarrhea, unspecified: Secondary | ICD-10-CM | POA: Diagnosis not present

## 2018-03-13 DIAGNOSIS — R140 Abdominal distension (gaseous): Secondary | ICD-10-CM | POA: Diagnosis not present

## 2018-03-20 ENCOUNTER — Telehealth: Payer: Self-pay

## 2018-03-20 NOTE — Telephone Encounter (Signed)
Called pt to find out if they have had labs for lipid they have at [facility] and they are going to fax the results.

## 2018-03-21 DIAGNOSIS — R69 Illness, unspecified: Secondary | ICD-10-CM | POA: Diagnosis not present

## 2018-03-23 LAB — POCT INR: INR: 3.9 — AB (ref 2.0–3.0)

## 2018-03-25 ENCOUNTER — Ambulatory Visit (INDEPENDENT_AMBULATORY_CARE_PROVIDER_SITE_OTHER): Payer: Medicare HMO | Admitting: Cardiology

## 2018-03-25 DIAGNOSIS — Z7901 Long term (current) use of anticoagulants: Secondary | ICD-10-CM | POA: Diagnosis not present

## 2018-03-25 DIAGNOSIS — Z952 Presence of prosthetic heart valve: Secondary | ICD-10-CM

## 2018-03-25 NOTE — Telephone Encounter (Signed)
Called pt to find out why [facility] hasn't sent labs I requested for repatha renewal PA

## 2018-04-13 LAB — POCT INR: INR: 2.4 (ref 2.0–3.0)

## 2018-04-15 ENCOUNTER — Ambulatory Visit (INDEPENDENT_AMBULATORY_CARE_PROVIDER_SITE_OTHER): Payer: Medicare HMO | Admitting: Cardiovascular Disease

## 2018-04-15 DIAGNOSIS — Z7901 Long term (current) use of anticoagulants: Secondary | ICD-10-CM | POA: Diagnosis not present

## 2018-04-15 DIAGNOSIS — Z952 Presence of prosthetic heart valve: Secondary | ICD-10-CM

## 2018-04-22 ENCOUNTER — Other Ambulatory Visit: Payer: Self-pay

## 2018-04-22 MED ORDER — EVOLOCUMAB 140 MG/ML ~~LOC~~ SOAJ: 140.0000 mg | SUBCUTANEOUS | 12 refills | Status: DC

## 2018-04-22 NOTE — Telephone Encounter (Signed)
Called to let pt know repatha pa approved and rx [facility] [place]

## 2018-04-23 ENCOUNTER — Other Ambulatory Visit: Payer: Self-pay | Admitting: Cardiovascular Disease

## 2018-04-24 ENCOUNTER — Other Ambulatory Visit: Payer: Self-pay

## 2018-04-24 ENCOUNTER — Other Ambulatory Visit: Payer: Self-pay | Admitting: Cardiovascular Disease

## 2018-04-24 MED ORDER — RAMIPRIL 10 MG PO CAPS: ORAL_CAPSULE | ORAL | 0 refills | Status: DC

## 2018-04-24 MED ORDER — WARFARIN SODIUM 10 MG PO TABS: ORAL_TABLET | ORAL | 0 refills | Status: DC

## 2018-04-24 MED ORDER — ATORVASTATIN CALCIUM 80 MG PO TABS: 80.0000 mg | ORAL_TABLET | Freq: Every day | ORAL | 0 refills | Status: DC

## 2018-04-24 NOTE — Telephone Encounter (Signed)
 *  STAT* If patient is at the pharmacy, call can be transferred to refill team.   1. Which medications need to be refilled? (please list name of each medication and dose if known)  warfarin (COUMADIN) 10 MG tablet ramipril (ALTACE) 10 MG capsule atorvastatin (LIPITOR) 80 MG tablet  2. Which pharmacy/location (including street and city if local pharmacy) is [facility] [place]  3. Do they need a 30 day or 90 day supply? 30  Patient is in [place] and stayed longer than expected and ran out of medicine. Wife is requesting a 30 day RX for these meds just to cover them till they come home. [facility] day as a special vacation script. Pt will be out of meds soon.

## 2018-04-24 NOTE — Telephone Encounter (Signed)
Refill request; needs 30-day supply of coumadin. Ramipril and atorvastatin have already been refilled. [facility] [place] [place] [phone]

## 2018-04-24 NOTE — Telephone Encounter (Signed)
error 

## 2018-04-30 DIAGNOSIS — Z952 Presence of prosthetic heart valve: Secondary | ICD-10-CM | POA: Diagnosis not present

## 2018-04-30 LAB — POCT INR: INR: 2.7 (ref 2.0–3.0)

## 2018-05-01 ENCOUNTER — Ambulatory Visit (INDEPENDENT_AMBULATORY_CARE_PROVIDER_SITE_OTHER): Payer: Medicare HMO | Admitting: Pharmacist Clinician (PhC)/ Clinical Pharmacy Specialist

## 2018-05-01 DIAGNOSIS — Z952 Presence of prosthetic heart valve: Secondary | ICD-10-CM

## 2018-05-01 DIAGNOSIS — Z7901 Long term (current) use of anticoagulants: Secondary | ICD-10-CM | POA: Diagnosis not present

## 2018-05-21 ENCOUNTER — Ambulatory Visit (INDEPENDENT_AMBULATORY_CARE_PROVIDER_SITE_OTHER): Payer: Medicare HMO | Admitting: Cardiology

## 2018-05-21 ENCOUNTER — Telehealth: Payer: Self-pay

## 2018-05-21 DIAGNOSIS — Z7901 Long term (current) use of anticoagulants: Secondary | ICD-10-CM

## 2018-05-21 DIAGNOSIS — Z952 Presence of prosthetic heart valve: Secondary | ICD-10-CM

## 2018-05-21 LAB — POCT INR: INR: 2.8 (ref 2.0–3.0)

## 2018-05-21 NOTE — Telephone Encounter (Signed)
Called let pt know that he needs to self test today

## 2018-05-22 DIAGNOSIS — R69 Illness, unspecified: Secondary | ICD-10-CM | POA: Diagnosis not present

## 2018-06-02 LAB — POCT INR: INR: 3.5 — AB (ref 2.0–3.0)

## 2018-06-02 LAB — PROTIME-INR

## 2018-06-05 ENCOUNTER — Ambulatory Visit (INDEPENDENT_AMBULATORY_CARE_PROVIDER_SITE_OTHER): Payer: Medicare HMO | Admitting: Pharmacist Clinician (PhC)/ Clinical Pharmacy Specialist

## 2018-06-05 DIAGNOSIS — R69 Illness, unspecified: Secondary | ICD-10-CM | POA: Diagnosis not present

## 2018-06-05 DIAGNOSIS — Z952 Presence of prosthetic heart valve: Secondary | ICD-10-CM

## 2018-06-05 DIAGNOSIS — Z7901 Long term (current) use of anticoagulants: Secondary | ICD-10-CM

## 2018-06-22 LAB — POCT INR: INR: 2.6 (ref 2.0–3.0)

## 2018-06-24 ENCOUNTER — Ambulatory Visit (INDEPENDENT_AMBULATORY_CARE_PROVIDER_SITE_OTHER): Payer: Medicare HMO | Admitting: Cardiovascular Disease

## 2018-06-24 DIAGNOSIS — Z7901 Long term (current) use of anticoagulants: Secondary | ICD-10-CM | POA: Diagnosis not present

## 2018-06-24 DIAGNOSIS — Z952 Presence of prosthetic heart valve: Secondary | ICD-10-CM

## 2018-07-19 LAB — POCT INR: INR: 2.7 (ref 2.0–3.0)

## 2018-07-29 ENCOUNTER — Ambulatory Visit (INDEPENDENT_AMBULATORY_CARE_PROVIDER_SITE_OTHER): Payer: Medicare HMO | Admitting: Pharmacist Clinician (PhC)/ Clinical Pharmacy Specialist

## 2018-07-29 DIAGNOSIS — Z952 Presence of prosthetic heart valve: Secondary | ICD-10-CM

## 2018-07-29 DIAGNOSIS — Z7901 Long term (current) use of anticoagulants: Secondary | ICD-10-CM | POA: Diagnosis not present

## 2018-08-12 ENCOUNTER — Telehealth: Payer: Self-pay

## 2018-08-12 MED ORDER — WARFARIN SODIUM 10 MG PO TABS: ORAL_TABLET | ORAL | 1 refills | Status: DC

## 2018-08-12 NOTE — Telephone Encounter (Signed)
rx refill send as requested

## 2018-08-18 LAB — POCT INR: INR: 3.1 — AB (ref 2.0–3.0)

## 2018-08-19 DIAGNOSIS — Z952 Presence of prosthetic heart valve: Secondary | ICD-10-CM | POA: Diagnosis not present

## 2018-08-26 ENCOUNTER — Telehealth: Payer: Self-pay | Admitting: Pharmacist

## 2018-08-26 ENCOUNTER — Ambulatory Visit (INDEPENDENT_AMBULATORY_CARE_PROVIDER_SITE_OTHER): Payer: Medicare HMO | Admitting: Pharmacist

## 2018-08-26 DIAGNOSIS — Z7901 Long term (current) use of anticoagulants: Secondary | ICD-10-CM | POA: Diagnosis not present

## 2018-08-26 DIAGNOSIS — Z952 Presence of prosthetic heart valve: Secondary | ICD-10-CM | POA: Diagnosis not present

## 2018-08-26 NOTE — Telephone Encounter (Signed)
INR not reported but not received from [place] See coumadin note for details

## 2018-08-27 DIAGNOSIS — I10 Essential (primary) hypertension: Secondary | ICD-10-CM | POA: Diagnosis not present

## 2018-08-27 DIAGNOSIS — E7800 Pure hypercholesterolemia, unspecified: Secondary | ICD-10-CM | POA: Diagnosis not present

## 2018-08-27 DIAGNOSIS — Z79899 Other long term (current) drug therapy: Secondary | ICD-10-CM | POA: Diagnosis not present

## 2018-08-27 DIAGNOSIS — E291 Testicular hypofunction: Secondary | ICD-10-CM | POA: Diagnosis not present

## 2018-08-27 DIAGNOSIS — Z125 Encounter for screening for malignant neoplasm of prostate: Secondary | ICD-10-CM | POA: Diagnosis not present

## 2018-09-07 ENCOUNTER — Other Ambulatory Visit: Payer: Self-pay | Admitting: Cardiology

## 2018-09-09 DIAGNOSIS — Z0000 Encounter for general adult medical examination without abnormal findings: Secondary | ICD-10-CM | POA: Diagnosis not present

## 2018-09-09 DIAGNOSIS — Z79899 Other long term (current) drug therapy: Secondary | ICD-10-CM | POA: Diagnosis not present

## 2018-09-09 DIAGNOSIS — Z952 Presence of prosthetic heart valve: Secondary | ICD-10-CM | POA: Diagnosis not present

## 2018-09-09 DIAGNOSIS — D696 Thrombocytopenia, unspecified: Secondary | ICD-10-CM | POA: Diagnosis not present

## 2018-09-09 DIAGNOSIS — G4730 Sleep apnea, unspecified: Secondary | ICD-10-CM | POA: Diagnosis not present

## 2018-09-09 DIAGNOSIS — E559 Vitamin D deficiency, unspecified: Secondary | ICD-10-CM | POA: Diagnosis not present

## 2018-09-09 DIAGNOSIS — I10 Essential (primary) hypertension: Secondary | ICD-10-CM | POA: Diagnosis not present

## 2018-09-09 DIAGNOSIS — E785 Hyperlipidemia, unspecified: Secondary | ICD-10-CM | POA: Diagnosis not present

## 2018-09-16 LAB — POCT INR: INR: 2.8 (ref 2.0–3.0)

## 2018-09-17 ENCOUNTER — Ambulatory Visit (INDEPENDENT_AMBULATORY_CARE_PROVIDER_SITE_OTHER): Payer: Medicare HMO | Admitting: Pharmacist Clinician (PhC)/ Clinical Pharmacy Specialist

## 2018-09-17 DIAGNOSIS — Z7901 Long term (current) use of anticoagulants: Secondary | ICD-10-CM | POA: Diagnosis not present

## 2018-09-17 DIAGNOSIS — Z952 Presence of prosthetic heart valve: Secondary | ICD-10-CM | POA: Diagnosis not present

## 2018-09-17 DIAGNOSIS — I9789 Other postprocedural complications and disorders of the circulatory system, not elsewhere classified: Secondary | ICD-10-CM

## 2018-09-17 DIAGNOSIS — I4891 Unspecified atrial fibrillation: Secondary | ICD-10-CM | POA: Diagnosis not present

## 2018-09-19 ENCOUNTER — Telehealth: Payer: Self-pay | Admitting: Cardiovascular Disease

## 2018-09-19 DIAGNOSIS — R69 Illness, unspecified: Secondary | ICD-10-CM | POA: Diagnosis not present

## 2018-09-19 MED ORDER — RAMIPRIL 10 MG PO CAPS: ORAL_CAPSULE | ORAL | 0 refills | Status: DC

## 2018-09-19 MED ORDER — WARFARIN SODIUM 10 MG PO TABS: ORAL_TABLET | ORAL | 2 refills | Status: DC

## 2018-09-19 NOTE — Telephone Encounter (Signed)
Please advise of the Warfarin refill, thank you!

## 2018-09-19 NOTE — Telephone Encounter (Signed)
New message   Pt c/o medication issue:  1. Name of Medication:  ramipril (ALTACE) 10 MG capsule          warfarin (COUMADIN) 10 MG tablet         2. How are you currently taking this medication (dosage and times per day)? Ramipril:1 time daily   Wafarin: 1 time daily except for Monday and Friday takes 15 mg   3. Are you having a reaction (difficulty breathing--STAT)?n/a  4. What is your medication issue?Patient's wife states that needs 90 day prescriptions for these medications [facility] [place], [place]

## 2018-09-30 ENCOUNTER — Other Ambulatory Visit: Payer: Self-pay | Admitting: *Deleted

## 2018-09-30 DIAGNOSIS — I25810 Atherosclerosis of coronary artery bypass graft(s) without angina pectoris: Secondary | ICD-10-CM

## 2018-09-30 DIAGNOSIS — I712 Thoracic aortic aneurysm, without rupture, unspecified: Secondary | ICD-10-CM

## 2018-10-07 ENCOUNTER — Telehealth: Payer: Self-pay | Admitting: Cardiovascular Disease

## 2018-10-07 NOTE — Telephone Encounter (Signed)
LMTCB. Patient is scheduled for CT Angio Chest/Aorta on July 21 at 10:45 at [facility].  No solid food 2 hours prior to the test. Liquids OK.

## 2018-10-14 LAB — POCT INR: INR: 3.4 — AB (ref 2.0–3.0)

## 2018-10-15 ENCOUNTER — Ambulatory Visit (INDEPENDENT_AMBULATORY_CARE_PROVIDER_SITE_OTHER): Payer: Medicare HMO | Admitting: Pharmacist

## 2018-10-15 DIAGNOSIS — R69 Illness, unspecified: Secondary | ICD-10-CM | POA: Diagnosis not present

## 2018-10-15 DIAGNOSIS — Z952 Presence of prosthetic heart valve: Secondary | ICD-10-CM

## 2018-10-15 DIAGNOSIS — Z7901 Long term (current) use of anticoagulants: Secondary | ICD-10-CM | POA: Diagnosis not present

## 2018-10-21 ENCOUNTER — Ambulatory Visit (INDEPENDENT_AMBULATORY_CARE_PROVIDER_SITE_OTHER): Payer: Medicare HMO | Admitting: Pharmacist Clinician (PhC)/ Clinical Pharmacy Specialist

## 2018-10-21 ENCOUNTER — Telehealth: Payer: Self-pay | Admitting: Cardiovascular Disease

## 2018-10-21 DIAGNOSIS — Z952 Presence of prosthetic heart valve: Secondary | ICD-10-CM

## 2018-10-21 DIAGNOSIS — Z7901 Long term (current) use of anticoagulants: Secondary | ICD-10-CM

## 2018-10-21 LAB — POCT INR: INR: 4.3 — AB (ref 2.0–3.0)

## 2018-10-21 NOTE — Telephone Encounter (Signed)
New Message    Marcus [name] is calling from Dr Marcus [name] office and said the pt is having some Oral bleeding from a procedure that was done last week and wanting to know what he can take for this and they have additional questions....  Pts wife told them that he could come off of the blood thinner for tonight and tomorrow and she said she is needing that information to be faxed to their office for their records   Fax number [phone]   Please call

## 2018-10-21 NOTE — Telephone Encounter (Signed)
INR report with instructions faxed to [facility]

## 2018-10-21 NOTE — Patient Instructions (Signed)
Hold warfarin dose Monday July 13 and Tuesday July 14.  Then take only 1/2 tablet (5 mg) on Wednesday July 15.  Resume normal dosing on Thursday, with 10 mg daily except 15 mg each Monday and Friday.  Repeat INR in 1 week  (would normally hold warfarin x 1 day for elevated INR with his goal at 2.5-3.5).  Will hold x 2 days and give lower dose on Wednesday to help with clotting in his gums.  Patient wife aware to call with any other concers.  Will fax this to his dentist for their records.

## 2018-10-28 ENCOUNTER — Telehealth: Payer: Self-pay | Admitting: *Deleted

## 2018-10-28 ENCOUNTER — Other Ambulatory Visit: Payer: Self-pay

## 2018-10-28 ENCOUNTER — Other Ambulatory Visit: Payer: Medicare HMO | Admitting: *Deleted

## 2018-10-28 ENCOUNTER — Other Ambulatory Visit: Payer: Self-pay | Admitting: *Deleted

## 2018-10-28 DIAGNOSIS — I25810 Atherosclerosis of coronary artery bypass graft(s) without angina pectoris: Secondary | ICD-10-CM

## 2018-10-28 DIAGNOSIS — I712 Thoracic aortic aneurysm, without rupture, unspecified: Secondary | ICD-10-CM

## 2018-10-28 NOTE — Telephone Encounter (Signed)

## 2018-10-29 ENCOUNTER — Ambulatory Visit (INDEPENDENT_AMBULATORY_CARE_PROVIDER_SITE_OTHER): Payer: Medicare HMO | Admitting: Pharmacist Clinician (PhC)/ Clinical Pharmacy Specialist

## 2018-10-29 ENCOUNTER — Telehealth: Payer: Self-pay | Admitting: Cardiovascular Disease

## 2018-10-29 ENCOUNTER — Ambulatory Visit (INDEPENDENT_AMBULATORY_CARE_PROVIDER_SITE_OTHER)
Admission: RE | Admit: 2018-10-29 | Discharge: 2018-10-29 | Disposition: A | Discharge status: Home / Self Care | Payer: Medicare HMO | Source: Ambulatory Visit | Attending: Cardiovascular Disease | Admitting: Cardiovascular Disease

## 2018-10-29 DIAGNOSIS — I712 Thoracic aortic aneurysm, without rupture, unspecified: Secondary | ICD-10-CM

## 2018-10-29 DIAGNOSIS — Z952 Presence of prosthetic heart valve: Secondary | ICD-10-CM | POA: Diagnosis not present

## 2018-10-29 DIAGNOSIS — Z7901 Long term (current) use of anticoagulants: Secondary | ICD-10-CM

## 2018-10-29 LAB — BASIC METABOLIC PANEL
BUN/Creatinine Ratio: 12 (ref 10–24)
BUN: 10 mg/dL (ref 8–27)
CO2: 24 mmol/L (ref 20–29)
Calcium: 8.9 mg/dL (ref 8.6–10.2)
Chloride: 104 mmol/L (ref 96–106)
Creatinine, Ser: 0.83 mg/dL (ref 0.76–1.27)
GFR calc Af Amer: 101 mL/min/{1.73_m2} (ref >59)
GFR calc non Af Amer: 87 mL/min/{1.73_m2} (ref >59)
Glucose: 87 mg/dL (ref 65–99)
Potassium: 4.3 mmol/L (ref 3.5–5.2)
Sodium: 141 mmol/L (ref 134–144)

## 2018-10-29 LAB — POCT INR: INR: 1.3 — AB (ref 2.0–3.0)

## 2018-10-29 MED ORDER — IOHEXOL 350 MG/ML SOLN
100.0000 mL | Freq: Once | INTRAVENOUS | Status: AC | PRN
  Administered 2018-10-29: 100 mL via INTRAVENOUS

## 2018-10-29 NOTE — Telephone Encounter (Signed)
Sent to [facility]

## 2018-10-29 NOTE — Telephone Encounter (Signed)
New Message      Marcus [name] is calling to report an out of range INR  1.3 Today

## 2018-10-29 NOTE — Telephone Encounter (Signed)
See anticoag note

## 2018-10-30 ENCOUNTER — Telehealth: Payer: Self-pay

## 2018-10-30 NOTE — Telephone Encounter (Signed)
Pt's spouse has questions regarding ct scan pt needs call back from kristin

## 2018-10-30 NOTE — Telephone Encounter (Signed)
Pt and spouse reviewed information received in MyChart and spoke to [employer]

## 2018-11-06 LAB — POCT INR: INR: 2.8 (ref 2.0–3.0)

## 2018-11-07 DIAGNOSIS — Z952 Presence of prosthetic heart valve: Secondary | ICD-10-CM | POA: Diagnosis not present

## 2018-11-08 ENCOUNTER — Ambulatory Visit (INDEPENDENT_AMBULATORY_CARE_PROVIDER_SITE_OTHER): Payer: Medicare HMO | Admitting: Pharmacist Clinician (PhC)/ Clinical Pharmacy Specialist

## 2018-11-08 DIAGNOSIS — Z7901 Long term (current) use of anticoagulants: Secondary | ICD-10-CM

## 2018-11-08 DIAGNOSIS — Z952 Presence of prosthetic heart valve: Secondary | ICD-10-CM

## 2018-11-11 ENCOUNTER — Encounter (HOSPITAL_COMMUNITY): Payer: Self-pay

## 2018-11-11 ENCOUNTER — Other Ambulatory Visit: Payer: Self-pay

## 2018-11-11 ENCOUNTER — Emergency Department (HOSPITAL_COMMUNITY)
Admission: EM | Admit: 2018-11-11 | Discharge: 2018-11-11 | Disposition: A | Discharge status: Home / Self Care | Payer: Medicare HMO | Attending: Emergency Medicine | Admitting: Emergency Medicine

## 2018-11-11 DIAGNOSIS — R55 Syncope and collapse: Secondary | ICD-10-CM | POA: Insufficient documentation

## 2018-11-11 DIAGNOSIS — R42 Dizziness and giddiness: Secondary | ICD-10-CM | POA: Diagnosis not present

## 2018-11-11 DIAGNOSIS — Z5321 Procedure and treatment not carried out due to patient leaving prior to being seen by health care provider: Secondary | ICD-10-CM | POA: Insufficient documentation

## 2018-11-11 LAB — CBC
HCT: 41.3 % (ref 39.0–52.0)
Hemoglobin: 13.6 g/dL (ref 13.0–17.0)
MCH: 32.2 pg (ref 26.0–34.0)
MCHC: 32.9 g/dL (ref 30.0–36.0)
MCV: 97.9 fL (ref 80.0–100.0)
Platelets: 136 10*3/uL — ABNORMAL LOW (ref 150–400)
RBC: 4.22 MIL/uL (ref 4.22–5.81)
RDW: 12.8 % (ref 11.5–15.5)
WBC: 4.8 10*3/uL (ref 4.0–10.5)
nRBC: 0 % (ref 0.0–0.2)

## 2018-11-11 LAB — BASIC METABOLIC PANEL
Anion gap: 5 (ref 5–15)
BUN: 11 mg/dL (ref 8–23)
CO2: 25 mmol/L (ref 22–32)
Calcium: 8.8 mg/dL — ABNORMAL LOW (ref 8.9–10.3)
Chloride: 107 mmol/L (ref 98–111)
Creatinine, Ser: 0.81 mg/dL (ref 0.61–1.24)
GFR calc Af Amer: >60 mL/min (ref >60)
GFR calc non Af Amer: >60 mL/min (ref >60)
Glucose, Bld: 120 mg/dL — ABNORMAL HIGH (ref 70–99)
Potassium: 3.9 mmol/L (ref 3.5–5.1)
Sodium: 137 mmol/L (ref 135–145)

## 2018-11-11 NOTE — ED Triage Notes (Signed)
Pt endorses having syncopal episode while working in his yard today around 1600. Denies dizziness prior to episode. Denies CP, shob or any cardiac sx. Pt takes a medication to control HR since valve replacement. VSS here. Axox4. No neuro sx.

## 2018-11-11 NOTE — ED Notes (Signed)
Levander Campion Wife [phone] Please Call

## 2018-11-11 NOTE — ED Notes (Signed)
Pt did not want to wait any longer and left

## 2018-11-13 ENCOUNTER — Telehealth: Payer: Self-pay | Admitting: *Deleted

## 2018-11-13 ENCOUNTER — Telehealth: Payer: Self-pay | Admitting: Radiology

## 2018-11-13 DIAGNOSIS — R55 Syncope and collapse: Secondary | ICD-10-CM

## 2018-11-13 NOTE — Telephone Encounter (Signed)
Enrolled patient for a 14 day Preventcie Event Monitor to be mailed. Brief instructions were gone over with patient and he knows to expect the monitor to arrive in 3-4 days.

## 2018-11-13 NOTE — Telephone Encounter (Signed)
The patient has been called and advised that Dr. Sallyanne Kuster would like for the patient to wear a 14 day live heart monitor due to his recent syncope and collapse episode. The patient has agreed and the order has been placed. He has been advised that someone will be calling him to set this up. His follow up appointment is 12/04/2018  Per Dr. Sallyanne Kuster, I would like him to wear a 14 day event monitor (live reports, please, either zio or preventice), before he sees me. Drink plenty of water, avoid heat. If event monitor is unrevealing, may need to discuss implantable loop recorder at his appointment.

## 2018-11-17 ENCOUNTER — Emergency Department (HOSPITAL_COMMUNITY): Payer: Medicare HMO

## 2018-11-17 ENCOUNTER — Encounter (HOSPITAL_COMMUNITY): Payer: Self-pay | Admitting: Emergency Medicine

## 2018-11-17 ENCOUNTER — Other Ambulatory Visit: Payer: Self-pay

## 2018-11-17 ENCOUNTER — Emergency Department (HOSPITAL_COMMUNITY)
Admission: EM | Admit: 2018-11-17 | Discharge: 2018-11-17 | Disposition: A | Discharge status: Home / Self Care | Payer: Medicare HMO | Attending: Emergency Medicine | Admitting: Emergency Medicine

## 2018-11-17 ENCOUNTER — Telehealth: Payer: Self-pay | Admitting: Student

## 2018-11-17 DIAGNOSIS — F17210 Nicotine dependence, cigarettes, uncomplicated: Secondary | ICD-10-CM | POA: Diagnosis not present

## 2018-11-17 DIAGNOSIS — Z7982 Long term (current) use of aspirin: Secondary | ICD-10-CM | POA: Diagnosis not present

## 2018-11-17 DIAGNOSIS — R42 Dizziness and giddiness: Secondary | ICD-10-CM | POA: Diagnosis not present

## 2018-11-17 DIAGNOSIS — Z7901 Long term (current) use of anticoagulants: Secondary | ICD-10-CM | POA: Diagnosis not present

## 2018-11-17 DIAGNOSIS — I10 Essential (primary) hypertension: Secondary | ICD-10-CM | POA: Insufficient documentation

## 2018-11-17 DIAGNOSIS — R531 Weakness: Secondary | ICD-10-CM | POA: Diagnosis not present

## 2018-11-17 DIAGNOSIS — R55 Syncope and collapse: Secondary | ICD-10-CM | POA: Diagnosis not present

## 2018-11-17 DIAGNOSIS — Z79899 Other long term (current) drug therapy: Secondary | ICD-10-CM | POA: Insufficient documentation

## 2018-11-17 DIAGNOSIS — R69 Illness, unspecified: Secondary | ICD-10-CM | POA: Diagnosis not present

## 2018-11-17 DIAGNOSIS — Z951 Presence of aortocoronary bypass graft: Secondary | ICD-10-CM | POA: Insufficient documentation

## 2018-11-17 LAB — TSH: TSH: 0.846 u[IU]/mL (ref 0.350–4.500)

## 2018-11-17 LAB — MAGNESIUM: Magnesium: 2.1 mg/dL (ref 1.7–2.4)

## 2018-11-17 LAB — CALCIUM: Calcium: 8.7 mg/dL — ABNORMAL LOW (ref 8.9–10.3)

## 2018-11-17 LAB — TROPONIN I (HIGH SENSITIVITY)
Troponin I (High Sensitivity): 3 ng/L (ref <18)
Troponin I (High Sensitivity): 3 ng/L (ref <18)

## 2018-11-17 NOTE — ED Provider Notes (Signed)
[facility] EMERGENCY DEPARTMENT Provider Note   CSN: [id number] Arrival date & time: 11/17/18  1103    History   Chief Complaint Chief Complaint  Patient presents with   Dizziness   Hypertension    HPI Marcus [name] is a 73 y.o. male, PMH A. Fib s/p AVR on warfarin, CAD history of CABG, hypertension, hyperlipidemia  Patient presents with complaints of presyncope that started on 8/3.  He states that he was working in his garden, went to walk back into his house, when he felt very weak and dizzy.  He notes that he was able to tell his wife "I am about to pass out" and she was able to lie him down.  Patient stated that he felt his legs "buckle."  He states he did not fall because she was able to catch him, denies hitting any extremities or his head.  He presented to the ED following this episode, but did not stay for evaluation.  He called his cardiologist the following day, and was told that an order for an event monitor was placed, the patient never received this.  He states that since then, he has had about 3 more episodes where he feels dizzy, nauseous, and weak, and all have been associated with "very high blood pressures."  Notes that most recent episode was this morning.  States that he woke up, walked into the bathroom, urinated, then when he went to leave the restroom, he felt dizzy, weak, and was able to sit.  He yelled for his wife, who came in and checked his blood pressure.  He states that it was elevated to 190s.  Notes that after a few moments, his blood pressure improved, and improved at the same time that his symptoms improve.  His wife called the on-call provider for his cardiologist, who advised him to come to ED for follow-up.  He denies chest pain, shortness of breath, fevers, lower extremity swelling.  Denies dizziness upon standing.  States that dizziness always occurs randomly, usually when he is walking.  States that outside of episodes, he feels to be in  his normal state of health.  He denies any weakness, paresthesias, headaches, changes in vision.  Past Medical History:  Diagnosis Date   Atrial fibrillation ([place])    Dyspnea    with exertion    Hypertension    Obstructive sleep apnea 10/25/2007    no longer on cpap   S/P AVR    2D ECHO, 04/25/2011 - EF >55%, Right ventricle-mild-moderately dilated   Swelling of limb    LEA VENOUS, 08/22/2009 - no evidence of deep vein or superficial thrombosis; partially rupturing Baker's Cyst    Patient Active Problem List   Diagnosis Date Noted   Postoperative atrial fibrillation ([place]) 12/22/2012   OSA (obstructive sleep apnea) 11/01/2012   Bradycardia, severe sinus 09/09/2012   CAD (coronary artery disease) 09/09/2012   S/P thoracic aortic aneurysm repair 09/09/2012   Hx of CABG 09/09/2012   Hyperlipidemia 09/09/2012   HTN, goal below 130/80 09/09/2012   Long term current use of anticoagulant therapy 06/28/2012   H/O aortic valve replacement 06/28/2012    Past Surgical History:  Procedure Laterality Date   CARDIAC CATHETERIZATION Bilateral 05/10/2007   Significant 1-vessel disease, severely dilated aortic root with moderate severe aortic insufficiency   CARDIAC SURGERY     CARDIOVERSION  08/02/2007   150 Joule biphasic shock with restoration of sinus rhythm. Heart rate 60.   cataract surgery  COLONOSCOPY WITH PROPOFOL N/A 12/15/2016   Procedure: COLONOSCOPY WITH PROPOFOL;  Surgeon: Carol Ada, MD;  Location: [facility];  Service: Endoscopy;  Laterality: N/A;   NM MYOVIEW LTD  04/08/2007   No evidence of inducible myocardial ischemia   THYROIDECTOMY, PARTIAL     torn meniscus in right knee surgery           Home Medications    Prior to Admission medications   Medication Sig Start Date End Date Taking? Authorizing Provider  Ascorbic Acid (VITAMIN C) 1000 MG tablet Take 1,000 mg by mouth daily.    [provider]  aspirin EC 81 MG tablet Take  81 mg by mouth daily.    [provider]  atorvastatin (LIPITOR) 80 MG tablet Take 1 tablet (80 mg total) by mouth daily. 04/24/18   [name], Mihai, MD  CALCIUM PO Take 1,200 mg by mouth daily.    [provider]  carboxymethylcellulose (REFRESH PLUS) 0.5 % SOLN Place 1 drop into both eyes daily as needed (dry eyes).     [provider]  cetirizine (ZYRTEC) 10 MG tablet Take 10 mg by mouth daily.    [provider]  cholecalciferol (VITAMIN D) 1000 units tablet Take 1,000 Units by mouth daily.    [provider]  CINNAMON PO Take 1,000 mg by mouth daily.     [provider]  clindamycin (CLEOCIN) 300 MG capsule Take 2 tablets (600 mg total) 30-60 minutes prior to dental or medical procedure. 09/12/17   [name], Mihai, MD  Evolocumab (REPATHA SURECLICK) 595 MG/ML SOAJ Inject 140 mg into the skin every 14 (fourteen) days. 04/22/18   Troy Sine, MD  Multiple Vitamins-Minerals (MULTIVITAMIN WITH MINERALS) tablet Take 1 tablet by mouth daily.    [provider]  Omega-3 Krill Oil 500 MG CAPS Take 500 mg by mouth daily.     [provider]  ramipril (ALTACE) 10 MG capsule TAKE 1 CAPSULE BY MOUTH EVERY DAY 09/19/18   [name], Mihai, MD  vitamin B-12 (CYANOCOBALAMIN) 1000 MCG tablet Take 1,000 mcg by mouth daily.    [provider]  warfarin (COUMADIN) 10 MG tablet Take 1 to 1 and 1/2 tablets daily as directed by coumadin clinic 09/19/18   Troy Sine, MD    Family History Family History  Problem Relation Age of Onset   Cancer Father     Social History Social History   Tobacco Use   Smoking status: Former Smoker    Types: Cigarettes    Quit date: 04/11/1987    Years since quitting: 31.6   Smokeless tobacco: Never Used  Substance Use Topics   Alcohol use: No   Drug use: No     Allergies   Amlodipine, Tetanus toxoids, Crestor [rosuvastatin], and Penicillins   Review of Systems Review of Systems    Constitutional: Negative for activity change, appetite change, fatigue and fever.  HENT: Negative for congestion and sore throat.   Eyes: Negative for visual disturbance.  Respiratory: Negative for cough and shortness of breath.   Cardiovascular: Negative for chest pain and leg swelling.  Gastrointestinal: Positive for nausea. Negative for abdominal pain, blood in stool, constipation, diarrhea and vomiting.  Genitourinary: Negative for difficulty urinating, dysuria, frequency and hematuria.  Neurological: Positive for dizziness. Negative for syncope, facial asymmetry, weakness, numbness and headaches.     Physical Exam Updated Vital Signs BP 136/84    Pulse (!) 57    Temp 98.8 F (37.1 C) (Oral)  Resp 13    Ht 5\' 11"  (1.803 m)    Wt 80.7 kg    SpO2 97%    BMI 24.83 kg/m   Physical Exam Vitals signs and nursing note reviewed.  Constitutional:      General: He is not in acute distress.    Appearance: Normal appearance. He is not ill-appearing.  HENT:     Head: Normocephalic and atraumatic.     Mouth/Throat:     Mouth: Mucous membranes are moist.  Eyes:     Extraocular Movements: Extraocular movements intact.     Conjunctiva/sclera: Conjunctivae normal.     Pupils: Pupils are equal, round, and reactive to light.  Neck:     Musculoskeletal: Normal range of motion.  Cardiovascular:     Rate and Rhythm: Bradycardia present. Rhythm irregular.     Pulses: Normal pulses.     Heart sounds: Murmur (mechanical click) present.  Pulmonary:     Effort: Pulmonary effort is normal.     Breath sounds: Normal breath sounds. No wheezing, rhonchi or rales.  Abdominal:     General: Abdomen is flat. Bowel sounds are normal.     Palpations: Abdomen is soft.     Tenderness: There is no abdominal tenderness.  Musculoskeletal: Normal range of motion.        General: No tenderness.     Right lower leg: No edema.     Left lower leg: No edema.  Skin:    General: Skin is warm and dry.   Neurological:     General: No focal deficit present.     Mental Status: He is alert and oriented to person, place, and time. Mental status is at baseline.     Cranial Nerves: No cranial nerve deficit.     Sensory: No sensory deficit.     Motor: No weakness.  Psychiatric:        Mood and Affect: Mood normal.        Behavior: Behavior normal.      ED Treatments / Results  Labs (all labs ordered are listed, but only abnormal results are displayed) Labs Reviewed  CALCIUM - Abnormal; Notable for the following components:      Result Value   Calcium 8.7 (*)    All other components within normal limits  MAGNESIUM  TSH  TROPONIN I (HIGH SENSITIVITY)  TROPONIN I (HIGH SENSITIVITY)    EKG EKG Interpretation  Date/Time:  Sunday November 17 2018 11:56:44 EDT Ventricular Rate:  63 PR Interval:  152 QRS Duration: 134 QT Interval:  426 QTC Calculation: 435 R Axis:   49 Text Interpretation:  Sinus rhythm with Premature atrial complexes Right bundle branch block No significant change since last tracing Confirmed by Blanchie Dessert [phone]) on 11/17/2018 12:05:28 PM   Radiology Dg Chest 2 View  Result Date: 11/17/2018 CLINICAL DATA:  73 year old male with history of dizziness, nausea and weakness. EXAM: CHEST - 2 VIEW COMPARISON:  Chest x-ray 09/05/2012. FINDINGS: Lung volumes are normal. No consolidative airspace disease. No pleural effusions. No pneumothorax. No pulmonary nodule or mass noted. Pulmonary vasculature and the cardiomediastinal silhouette are within normal limits. Atherosclerosis in the thoracic aorta. Status post median sternotomy for aortic valve replacement with a mechanical aortic valve. IMPRESSION: 1.  No radiographic evidence of acute cardiopulmonary disease. 2. Aortic atherosclerosis. 3. Status post aortic valve replacement. Electronically Signed   By: Vinnie Langton M.D.   On: 11/17/2018 14:23    Procedures Procedures (including critical care time)  Medications  Ordered in ED Medications - No data to display   Initial Impression / Assessment and Plan / ED Course  I have reviewed the triage vital signs and the nursing notes.  Pertinent labs & imaging results that were available during my care of the patient were reviewed by me and considered in my medical decision making (see chart for details).  DAMAREA MERKEL is a 73 year old with past medical history of atrial fibrillation on Coumadin, s/p mechanical AVR, CAD s/p CABG, HTN, HLD who presents with multiple episodes of presyncope in the last 6 days.  He was diagnosed to come to ED by on-call provider with cardiology.  Doubt PE in the setting of compliance with Coumadin and INR of 2.8 on 7/29.  Doubt aortic dissection given stable vital signs and good pulses on exam.  Patient is bradycardic on exam, but on chart review appears to be chronic.  Could consider arrhythmia or atypical ACS as cause.  Patient had BMP and CBC on 8/3 without abnormalities.  Will order calcium, magnesium, chest x-ray, troponins to assess.  Ca mildly decreased, Mag WNL.  High Sensitivity trop 3, and drawn >2 hrs since episode, therefore would not repeat.  CXR without acute abnormality.  Wife also now notes that patient's rhythm is charted as irregular on BP cuff when this occurs.  Spoke with on-call cardiologist Dr. Curt Bears, stated that no further ED work up indicated at this time and that patient should follow up with Dr. Sallyanne Kuster as outpatient with outpatient event monitor, that has already been ordered and will be delivered.    Patient's wife also reports a history of thyroid tumor in 2014, and states that he has not had his thyroid checked since then.  Will obtain TSH.  Test results and cardiology recommendations with patient.  He and his wife agree to plan.  If TSH within normal limits, can discharge home with follow-up with cardiology.  Reassured that he has remained asymptomatic throughout his time in the ED with stable  vitals.  Patient signed out to oncoming provider, Dr. Ralene Bathe.  Final Clinical Impressions(s) / ED Diagnoses   Final diagnoses:  Pre-syncope    ED Discharge Orders    None       Cleophas Dunker, DO 11/17/18 [place], MD 11/17/18 2121

## 2018-11-17 NOTE — ED Triage Notes (Signed)
Pt here for eval of dizziness for 1 week associated with hight blood pressure. No chest pain no headache, no problems with speech or vision. Pt states his throat has been scratchy for several days. Noted pt was here 8/3.

## 2018-11-17 NOTE — ED Provider Notes (Signed)
Patient care assumed at 1500.  Pt here for recurrent near syncopal episodes.  Has Cardiology follow up.  Pending TSH.  TSH within normal limits.  D/w pt home care for near syncope. Discussed outpatient Cardiology follow up and return precautions.     Quintella Reichert, MD 11/17/18 1626

## 2018-11-17 NOTE — Discharge Instructions (Addendum)
You were seen in the hospital for episodes of dizziness and weakness.  Your lab work has looked fine, we spoke with cardiology, you should follow-up with Dr. Recardo Evangelist.  Give his office a call, and let them know that you have not yet received your event monitor.  Should you have worsening symptoms, chest pain, shortness of breath, you should be seen right away.

## 2018-11-17 NOTE — Telephone Encounter (Signed)
   Received page from Answering Service that patient's wife called with concerns for high blood pressure and near syncope. Patient had syncopal episode on 11/11/2018. Went to the ED but left before being seen by provider after waiting 6 hours. Patient has continued to have episodes of near syncope since then. She estimates he has had at least 5 episodes of near syncope with latest one being this morning. BP this morning was 167/81. After he took his home Ramipril, it was 144/83 with heart rate of 49 bpm. NO chest pain. Per chart review, Dr. Sallyanne Kuster has order 14 day Event Monitor for further evaluation. However, patient's wife is very concerned and sounds tearful. Advised her that patient should be re-evaluated in the ED today given persisted pre-syncopal episode. Wife voiced understanding and agreed. She will either drive patient or call EMS.  Darreld Mclean, PA-C 11/17/2018 9:09 AM

## 2018-11-17 NOTE — ED Notes (Signed)
DC instructions discussed with pt and wife. Home stable with steady gait.

## 2018-11-17 NOTE — ED Notes (Signed)
ED Provider at bedside. 

## 2018-11-17 NOTE — ED Notes (Signed)
Patient transported to X-ray 

## 2018-11-18 ENCOUNTER — Telehealth: Payer: Self-pay | Admitting: *Deleted

## 2018-11-18 ENCOUNTER — Encounter (INDEPENDENT_AMBULATORY_CARE_PROVIDER_SITE_OTHER): Payer: Medicare HMO

## 2018-11-18 DIAGNOSIS — R55 Syncope and collapse: Secondary | ICD-10-CM | POA: Diagnosis not present

## 2018-11-18 NOTE — Telephone Encounter (Signed)
The patient stated that he went the ED yesterday and would like for Dr. Sallyanne Kuster to know. He would also like for him to review the tests that were completed. He has a follow up on 8/26 with Dr. Sallyanne Kuster.

## 2018-11-19 ENCOUNTER — Ambulatory Visit (INDEPENDENT_AMBULATORY_CARE_PROVIDER_SITE_OTHER): Payer: Medicare HMO | Admitting: Pharmacist Clinician (PhC)/ Clinical Pharmacy Specialist

## 2018-11-19 DIAGNOSIS — Z952 Presence of prosthetic heart valve: Secondary | ICD-10-CM

## 2018-11-19 DIAGNOSIS — Z7901 Long term (current) use of anticoagulants: Secondary | ICD-10-CM

## 2018-11-19 DIAGNOSIS — I4891 Unspecified atrial fibrillation: Secondary | ICD-10-CM | POA: Diagnosis not present

## 2018-11-19 DIAGNOSIS — I9789 Other postprocedural complications and disorders of the circulatory system, not elsewhere classified: Secondary | ICD-10-CM

## 2018-11-19 LAB — POCT INR: INR: 4.2 — AB (ref 2.0–3.0)

## 2018-11-20 NOTE — Telephone Encounter (Signed)
Call placed to the patient. His appointment has been moved up to 8/13. He verbalized his understanding.

## 2018-11-21 ENCOUNTER — Ambulatory Visit: Payer: Medicare HMO | Admitting: Cardiovascular Disease

## 2018-11-21 ENCOUNTER — Encounter: Payer: Self-pay | Admitting: Cardiovascular Disease

## 2018-11-21 ENCOUNTER — Other Ambulatory Visit: Payer: Self-pay

## 2018-11-21 VITALS — BP 144/77 | HR 71 | Ht 71.0 in | Wt 177.8 lb

## 2018-11-21 DIAGNOSIS — E7800 Pure hypercholesterolemia, unspecified: Secondary | ICD-10-CM

## 2018-11-21 DIAGNOSIS — I10 Essential (primary) hypertension: Secondary | ICD-10-CM | POA: Diagnosis not present

## 2018-11-21 DIAGNOSIS — I9789 Other postprocedural complications and disorders of the circulatory system, not elsewhere classified: Secondary | ICD-10-CM

## 2018-11-21 DIAGNOSIS — I4891 Unspecified atrial fibrillation: Secondary | ICD-10-CM

## 2018-11-21 DIAGNOSIS — Z952 Presence of prosthetic heart valve: Secondary | ICD-10-CM | POA: Diagnosis not present

## 2018-11-21 DIAGNOSIS — I712 Thoracic aortic aneurysm, without rupture: Secondary | ICD-10-CM

## 2018-11-21 DIAGNOSIS — G4733 Obstructive sleep apnea (adult) (pediatric): Secondary | ICD-10-CM

## 2018-11-21 DIAGNOSIS — I7121 Aneurysm of the ascending aorta, without rupture: Secondary | ICD-10-CM

## 2018-11-21 DIAGNOSIS — R55 Syncope and collapse: Secondary | ICD-10-CM | POA: Diagnosis not present

## 2018-11-21 DIAGNOSIS — Z7901 Long term (current) use of anticoagulants: Secondary | ICD-10-CM | POA: Diagnosis not present

## 2018-11-21 DIAGNOSIS — I25810 Atherosclerosis of coronary artery bypass graft(s) without angina pectoris: Secondary | ICD-10-CM

## 2018-11-21 NOTE — Patient Instructions (Signed)
Medication Instructions:  Your physician recommends that you continue on your current medications as directed. Please refer to the Current Medication list given to you today.  If you need a refill on your cardiac medications before your next appointment, please call your pharmacy.   Lab work: None ordered If you have labs (blood work) drawn today and your tests are completely normal, you will receive your results only by: [place] (if you have MyChart) OR A paper copy in the mail If you have any lab test that is abnormal or we need to change your treatment, we will call you to review the results.  Testing/Procedures: Your physician has requested that you have an echocardiogram. Echocardiography is a painless test that uses sound waves to create images of your heart. It provides your doctor with information about the size and shape of your heart and how well your heart's chambers and valves are working. You may receive an ultrasound enhancing agent through an IV if needed to better visualize your heart during the echo.This procedure takes approximately one hour. There are no restrictions for this procedure. This will take place at the [address].    Follow-Up: Keep your follow on 8/26 with Dr. Sallyanne Kuster

## 2018-11-21 NOTE — Progress Notes (Signed)
. Patient ID: Marcus [name], male   DOB: January 04, 1946, 73 y.o.   MRN: [id number]    Cardiology Office Note    Date:  11/23/2018   ID:  TARIS [name], DOB Aug 19, 1945, MRN [id number]  PCP:  Marcus Sorenson, MD  Cardiologist:   [name], MD   No chief complaint on file.   History of Present Illness:  Marcus [name] is a 73 y.o. male who presents for follow-up of aortic valve mechanical prosthesis, previous repair of ascending aortic aneurysm, severe asymptomatic sinus bradycardia and coronary artery disease with previous bypass surgery (Severe AI - Bentall 23 mm [employer] AVR, CABG x 2 - SVG-RCA and SVG-OM 2008, [place]).  [name] has had a couple of near syncopal episodes in the last week.  One event occurred on Tuesday, August 4 the other on Sunday, August 9.  They were similar and that they both occurred not long after he stood up and taken a few steps.  Both of them had prodromal symptoms of dizziness and he had time to tell his wife "I am going down".  They both associated nausea but no vomiting or true vertigo.  He felt clammy.  He looked pale.  He has had some discomfort and fullness in his right ear, but no fever, chills or other symptoms of upper respiratory infection.  The first event led him to the emergency room, but he went home since he was feeling better and has been waiting in the waiting room for several hours.  Second event he came to the emergency room by ambulance.  On both occasions labs including high-sensitivity troponin were normal.  Hemoglobin is 13.6, creatinine was 0.81.  Potassium was 3.9.  Chest x-ray on August 9 showed aortic atherosclerosis but no other abnormalities.  EKG showed sinus rhythm with occasional PACs, known right bundle branch block.   He reports 100% compliance with CPAP for sleep apnea. He monitors warfarin anticoagulation with a home monitor, supervised by our Coumadin clinic. INR was 4.3 on 8/11.  He had normal echocardiograms in 2014 and 2016 and  recently may 2019 with a mean aortic valve prosthesis gradient of 14 mmHg.  CT angiograms have shown stable mild dilation of the aortic root to 39 mm occluding the study performed on October 29, 2018.  We have decided to follow-up on an every five-year basis since it has been so stable.   Past Medical History:  Diagnosis Date  . Atrial fibrillation ([place])   . Dyspnea    with exertion   . Hypertension   . Obstructive sleep apnea 10/25/2007    no longer on cpap  . S/P AVR    2D ECHO, 04/25/2011 - EF >55%, Right ventricle-mild-moderately dilated  . Swelling of limb    LEA VENOUS, 08/22/2009 - no evidence of deep vein or superficial thrombosis; partially rupturing Baker's Cyst    Past Surgical History:  Procedure Laterality Date  . CARDIAC CATHETERIZATION Bilateral 05/10/2007   Significant 1-vessel disease, severely dilated aortic root with moderate severe aortic insufficiency  . CARDIAC SURGERY    . CARDIOVERSION  08/02/2007   150 Joule biphasic shock with restoration of sinus rhythm. Heart rate 60.  . cataract surgery     . COLONOSCOPY WITH PROPOFOL N/A 12/15/2016   Procedure: COLONOSCOPY WITH PROPOFOL;  Surgeon: Carol Ada, MD;  Location: [facility];  Service: Endoscopy;  Laterality: N/A;  . NM MYOVIEW LTD  04/08/2007   No evidence of inducible myocardial ischemia  . THYROIDECTOMY,  PARTIAL    . torn meniscus in right knee surgery       Current Medications: Outpatient Medications Prior to Visit  Medication Sig Dispense Refill  . Ascorbic Acid (VITAMIN C) 1000 MG tablet Take 1,000 mg by mouth daily.    Marland Kitchen aspirin EC 81 MG tablet Take 81 mg by mouth daily.    Marland Kitchen atorvastatin (LIPITOR) 80 MG tablet Take 1 tablet (80 mg total) by mouth daily. 30 tablet 0  . CALCIUM PO Take 1,200 mg by mouth daily.    . carboxymethylcellulose (REFRESH PLUS) 0.5 % SOLN Place 1 drop into both eyes daily as needed (dry eyes).     . cetirizine (ZYRTEC) 10 MG tablet Take 10 mg by mouth daily.    .  cholecalciferol (VITAMIN D) 1000 units tablet Take 1,000 Units by mouth daily.    Marland Kitchen CINNAMON PO Take 1,000 mg by mouth daily.     . clindamycin (CLEOCIN) 300 MG capsule Take 2 tablets (600 mg total) 30-60 minutes prior to dental or medical procedure. 8 capsule 1  . Multiple Vitamins-Minerals (MULTIVITAMIN WITH MINERALS) tablet Take 1 tablet by mouth daily.    . Omega-3 Krill Oil 500 MG CAPS Take 500 mg by mouth daily.     . ramipril (ALTACE) 10 MG capsule TAKE 1 CAPSULE BY MOUTH EVERY DAY 30 capsule 0  . vitamin B-12 (CYANOCOBALAMIN) 1000 MCG tablet Take 1,000 mcg by mouth daily.    Marland Kitchen warfarin (COUMADIN) 10 MG tablet Take 1 to 1 and 1/2 tablets daily as directed by coumadin clinic 45 tablet 2  . Evolocumab (REPATHA SURECLICK) 440 MG/ML SOAJ Inject 140 mg into the skin every 14 (fourteen) days. 2 pen 12   No facility-administered medications prior to visit.      Allergies:   Amlodipine, Tetanus toxoids, Crestor [rosuvastatin], and Penicillins   Social History   Socioeconomic History  . Marital status: Married    Spouse name: Not on file  . Number of children: Not on file  . Years of education: Not on file  . Highest education level: Not on file  Occupational History  . Not on file  Social Needs  . Financial resource strain: Not on file  . Food insecurity    Worry: Not on file    Inability: Not on file  . Transportation needs    Medical: Not on file    Non-medical: Not on file  Tobacco Use  . Smoking status: Former Smoker    Types: Cigarettes    Quit date: 04/11/1987    Years since quitting: 31.6  . Smokeless tobacco: Never Used  Substance and Sexual Activity  . Alcohol use: No  . Drug use: No  . Sexual activity: Not on file  Lifestyle  . Physical activity    Days per week: Not on file    Minutes per session: Not on file  . Stress: Not on file  Relationships  . Social Herbalist on phone: Not on file    Gets together: Not on file    Attends religious service:  Not on file    Active member of club or organization: Not on file    Attends meetings of clubs or organizations: Not on file    Relationship status: Not on file  Other Topics Concern  . Not on file  Social History Narrative  . Not on file     Family History:  The patient's family history includes Cancer in his father.  ROS:   Please see the history of present illness.    ROS All other systems reviewed and are negative.   PHYSICAL EXAM:   VS:  BP (!) 144/77   Pulse 71   Ht 5\' 11"  (1.803 m)   Wt 177 lb 12.8 oz (80.6 kg)   SpO2 98%   BMI 24.80 kg/m      General: Alert, oriented x3, no distress, slender and fit. Head: no evidence of trauma, PERRL, EOMI, no exophtalmos or lid lag, no myxedema, no xanthelasma; normal ears, nose and oropharynx Neck: normal jugular venous pulsations and no hepatojugular reflux; brisk carotid pulses without delay and no carotid bruits Chest: clear to auscultation, no signs of consolidation by percussion or palpation, normal fremitus, symmetrical and full respiratory excursions Cardiovascular: normal position and quality of the apical impulse, regular rhythm, normal first and second heart sounds, prosthetic clicks, 2/6 early peaking aortic ejection murmur, no diastolic murmurs, rubs or gallops Abdomen: no tenderness or distention, no masses by palpation, no abnormal pulsatility or arterial bruits, normal bowel sounds, no hepatosplenomegaly Extremities: no clubbing, cyanosis or edema; 2+ radial, ulnar and brachial pulses bilaterally; 2+ right femoral, posterior tibial and dorsalis pedis pulses; 2+ left femoral, posterior tibial and dorsalis pedis pulses; no subclavian or femoral bruits Neurological: grossly nonfocal Psych: Normal mood and affect   Wt Readings from Last 3 Encounters:  11/21/18 177 lb 12.8 oz (80.6 kg)  11/17/18 178 lb (80.7 kg)  08/10/17 184 lb 12.8 oz (83.8 kg)      Studies/Labs Reviewed:   EKG:  EKG is not ordered today.  The  ekg ordered 11/17/2018 demonstrates sinus rhythm with one PAC,  old right bundle branch block (QRS 134 ms), QTC 435 ms  BMET    Component Value Date/Time   NA 137 11/11/2018 1836   NA 141 10/28/2018 1256   K 3.9 11/11/2018 1836   CL 107 11/11/2018 1836   CO2 25 11/11/2018 1836   GLUCOSE 120 (H) 11/11/2018 1836   BUN 11 11/11/2018 1836   BUN 10 10/28/2018 1256   CREATININE 0.81 11/11/2018 1836   CALCIUM 8.7 (L) 11/17/2018 1305   GFRNONAA >60 11/11/2018 1836   GFRAA >60 11/11/2018 1836    Lipid Panel  No results found for: CHOL, TRIG, HDL, CHOLHDL, VLDL, LDLCALC, LDLDIRECT  08/13/2017 CHOL 162, HDL 47, LDL 99, TG 75  ASSESSMENT:    1. Syncope, unspecified syncope type   2. Coronary artery disease involving coronary bypass graft of native heart without angina pectoris   3. Essential hypertension   4. Hypercholesterolemia   5. Postoperative atrial fibrillation (H[facility])   6. OSA (obstructive sleep apnea)   7. Ascending aortic aneurysm ([place])   8. History of prosthetic aortic valve replacement   9. Long term (current) use of anticoagulants      PLAN:  In order of problems listed above:  1. Near-syncope: symptoms suggest vasovagal events, not arrhythmia. No arrhythmia seen so far on event monitor except PACs. Has history of asymptomatic sinus bradycardia in the 40s. Will wait for full monitor report. 2. CAD s/p CABG: No angina. He had a normal stress echo 04/2015.  3. HTN: usually well controlled. Avoid diuretics. 4. HLP: Last year LDL 99. Target LDL <70. 5. Postop AF: Required cardioversion in 2009 without any clinically evident recurrence since that time. He is on anticoagulation anyway for his prosthetic aortic valve.   6. OSA: Reports 100% compliance with CPAP.  Denies hypersomnolence and reports good results with  this treatment. 7. Ao aneurysm s/p Bentall repair: No evidence of other areas of aortic dilation on 2015 CT angiogram.  Due for a repeat CT angio soon. Will wait for  completion of current workup, in case he needs other coronary w/u. 8. Mechanical AVR: Recheck echo in view of recent symptom change. 9. Warfarin: Not serious bleeding complications or embolic events. Has a home INR monitor.    Medication Adjustments/Labs and Tests Ordered: Current medicines are reviewed at length with the patient today.  Concerns regarding medicines are outlined above.  Medication changes, Labs and Tests ordered today are listed in the Patient Instructions below. Patient Instructions  Medication Instructions:  Your physician recommends that you continue on your current medications as directed. Please refer to the Current Medication list given to you today.  If you need a refill on your cardiac medications before your next appointment, please call your pharmacy.   Lab work: None ordered If you have labs (blood work) drawn today and your tests are completely normal, you will receive your results only by: [place] (if you have MyChart) OR A paper copy in the mail If you have any lab test that is abnormal or we need to change your treatment, we will call you to review the results.  Testing/Procedures: Your physician has requested that you have an echocardiogram. Echocardiography is a painless test that uses sound waves to create images of your heart. It provides your doctor with information about the size and shape of your heart and how well your heart's chambers and valves are working. You may receive an ultrasound enhancing agent through an IV if needed to better visualize your heart during the echo.This procedure takes approximately one hour. There are no restrictions for this procedure. This will take place at the [address].    Follow-Up: Keep your follow on 8/26 with Dr. Sallyanne Kuster         Signed, [name], MD  11/23/2018 10:57 PM    [address], [place], [place]  [address] Phone: [phone];  Fax: [phone]  \

## 2018-11-25 ENCOUNTER — Other Ambulatory Visit: Payer: Self-pay

## 2018-11-25 ENCOUNTER — Ambulatory Visit (HOSPITAL_COMMUNITY): Payer: Medicare HMO | Attending: Cardiology

## 2018-11-25 DIAGNOSIS — R55 Syncope and collapse: Secondary | ICD-10-CM | POA: Diagnosis not present

## 2018-11-26 ENCOUNTER — Telehealth: Payer: Self-pay | Admitting: Cardiovascular Disease

## 2018-11-26 NOTE — Telephone Encounter (Signed)
° ° °  Please call with echo results

## 2018-11-26 NOTE — Telephone Encounter (Signed)
Patient made aware of results and verbalized understanding.  

## 2018-11-30 LAB — POCT INR: INR: 3 (ref 2.0–3.0)

## 2018-12-02 ENCOUNTER — Ambulatory Visit (INDEPENDENT_AMBULATORY_CARE_PROVIDER_SITE_OTHER): Payer: Medicare HMO | Admitting: Cardiology

## 2018-12-02 DIAGNOSIS — Z952 Presence of prosthetic heart valve: Secondary | ICD-10-CM

## 2018-12-02 DIAGNOSIS — Z7901 Long term (current) use of anticoagulants: Secondary | ICD-10-CM

## 2018-12-04 ENCOUNTER — Encounter: Payer: Self-pay | Admitting: Cardiovascular Disease

## 2018-12-04 ENCOUNTER — Ambulatory Visit: Payer: Medicare HMO | Admitting: Cardiovascular Disease

## 2018-12-04 ENCOUNTER — Other Ambulatory Visit: Payer: Self-pay

## 2018-12-04 VITALS — BP 133/78 | HR 54 | Temp 97.7°F | Ht 71.0 in | Wt 179.0 lb

## 2018-12-04 DIAGNOSIS — I4891 Unspecified atrial fibrillation: Secondary | ICD-10-CM

## 2018-12-04 DIAGNOSIS — I712 Thoracic aortic aneurysm, without rupture: Secondary | ICD-10-CM | POA: Diagnosis not present

## 2018-12-04 DIAGNOSIS — E7800 Pure hypercholesterolemia, unspecified: Secondary | ICD-10-CM | POA: Diagnosis not present

## 2018-12-04 DIAGNOSIS — Z7901 Long term (current) use of anticoagulants: Secondary | ICD-10-CM

## 2018-12-04 DIAGNOSIS — I25810 Atherosclerosis of coronary artery bypass graft(s) without angina pectoris: Secondary | ICD-10-CM

## 2018-12-04 DIAGNOSIS — Z952 Presence of prosthetic heart valve: Secondary | ICD-10-CM | POA: Diagnosis not present

## 2018-12-04 DIAGNOSIS — R55 Syncope and collapse: Secondary | ICD-10-CM | POA: Diagnosis not present

## 2018-12-04 DIAGNOSIS — G4733 Obstructive sleep apnea (adult) (pediatric): Secondary | ICD-10-CM | POA: Diagnosis not present

## 2018-12-04 DIAGNOSIS — I10 Essential (primary) hypertension: Secondary | ICD-10-CM | POA: Diagnosis not present

## 2018-12-04 DIAGNOSIS — I9789 Other postprocedural complications and disorders of the circulatory system, not elsewhere classified: Secondary | ICD-10-CM | POA: Diagnosis not present

## 2018-12-04 DIAGNOSIS — R400 Somnolence: Secondary | ICD-10-CM | POA: Diagnosis not present

## 2018-12-04 DIAGNOSIS — I7121 Aneurysm of the ascending aorta, without rupture: Secondary | ICD-10-CM

## 2018-12-04 NOTE — Patient Instructions (Signed)
Medication Instructions:  Your physician recommends that you continue on your current medications as directed. Please refer to the Current Medication list given to you today.  If you need a refill on your cardiac medications before your next appointment, please call your pharmacy.   Lab work: None ordered If you have labs (blood work) drawn today and your tests are completely normal, you will receive your results only by: [place] (if you have MyChart) OR A paper copy in the mail If you have any lab test that is abnormal or we need to change your treatment, we will call you to review the results.  Testing/Procedures: Your physician has recommended that you have an in home sleep study. This test records several body functions during sleep, including: brain activity, eye movement, oxygen and carbon dioxide blood levels, heart rate and rhythm, breathing rate and rhythm, the flow of air through your mouth and nose, snoring, body muscle movements, and chest and belly movement.  Follow-Up: At [facility], you and your health needs are our priority.  As part of our continuing mission to provide you with exceptional heart care, we have created designated Provider Care Teams.  These Care Teams include your primary Cardiologist (physician) and Advanced Practice Providers (APPs -  Physician Assistants and Nurse Practitioners) who all work together to provide you with the care you need, when you need it. You will need a follow up appointment in 3 months.  You may see [name], MD or one of the following Advanced Practice Providers on your designated Care Team: Almyra Deforest, [place] Fabian Sharp, [place]  Any Other Special Instructions Will Be Listed Below (If Applicable). Dr. Sallyanne Kuster would like for you to have an in home sleep study. We will call you to schedule this.  Dr. Sallyanne Kuster would also like for you to have an in home CPAP titration. We will call you to schedule this.

## 2018-12-04 NOTE — Progress Notes (Signed)
. Patient ID: Marcus [name], male   DOB: 10/11/1945, 73 y.o.   MRN: [id number]    Cardiology Office Note    Date:  12/06/2018   ID:  Marcus [name], DOB 1946/02/12, MRN [id number]  PCP:  Norberto Sorenson, MD  Cardiologist:   [name], MD   Chief Complaint  Patient presents with  . Dizziness    Near syncope  . Coronary Artery Disease    s/p CABG and AVR    History of Present Illness:  Marcus [name] is a 73 y.o. male who presents for follow-up of aortic valve mechanical prosthesis, previous repair of ascending aortic aneurysm, severe asymptomatic sinus bradycardia and coronary artery disease with previous bypass surgery (Severe AI - Bentall 23 mm [employer] AVR, CABG x 2 - SVG-RCA and SVG-OM 2008, [place]).  After he had a couple of near syncopal episodes earlier this month Marcus [name] had an echocardiogram which shows normal function of his AVR and normal left ventricular systolic function and has been ordered an event monitor that to date has not shown any meaningful arrhythmia.  At most he has had mild sinus bradycardia and he has had a few episodes of bigeminal premature atrial contractions.  While wearing the monitor he did not have any recurrent symptoms.  He is therapeutically anticoagulated without any bleeding problems and monitors his INR with a home device.  He does not have a history of stroke, TIA or other embolic events.  He reports 100% compliance with CPAP  He had normal echocardiograms in 2014 and 2016 and May 2019 and August 2020, with a mean aortic valve prosthesis gradient of 8-14 mmHg.  CT angiograms have shown stable mild dilation of the aortic root to 39 mm occluding the study performed on October 29, 2018.  We have decided to follow-up on an every five-year basis since it has been so stable.  He was diagnosed with sleep apnea based on a study in 2009 he used CPAP for years but then after losing weight was told that he does not need to use it apnea anymore, following a repeat  assessment in 2017 that showed only mild sleep.  He still has his own equipment, which he purchased at the time.  He now has clear-cut symptoms of daytime hypersomnolence.  He frequently falls asleep during quiet conversations with his wife.  He is not able to read a book or complete a show on television without falling asleep.  He is no longer driving commercially.  His recent echocardiogram showed enlargement of the right atrium and right ventricle.  Echocardiograms from 2009 in 2014 described the right ventricle is normal in size.  The echocardiograms in 2016 2019 described the right ventricle as mildly dilated.  The most recent echo from August 2020 states that the right ventricle is severely enlarged and the right atrium is also mild-moderately dilated.   Past Medical History:  Diagnosis Date  . Atrial fibrillation ([place])   . Dyspnea    with exertion   . Hypertension   . Obstructive sleep apnea 10/25/2007    no longer on cpap  . S/P AVR    2D ECHO, 04/25/2011 - EF >55%, Right ventricle-mild-moderately dilated  . Swelling of limb    LEA VENOUS, 08/22/2009 - no evidence of deep vein or superficial thrombosis; partially rupturing Baker's Cyst    Past Surgical History:  Procedure Laterality Date  . CARDIAC CATHETERIZATION Bilateral 05/10/2007   Significant 1-vessel disease, severely dilated aortic root with moderate  severe aortic insufficiency  . CARDIAC SURGERY    . CARDIOVERSION  08/02/2007   150 Joule biphasic shock with restoration of sinus rhythm. Heart rate 60.  . cataract surgery     . COLONOSCOPY WITH PROPOFOL N/A 12/15/2016   Procedure: COLONOSCOPY WITH PROPOFOL;  Surgeon: Carol Ada, MD;  Location: [facility];  Service: Endoscopy;  Laterality: N/A;  . NM MYOVIEW LTD  04/08/2007   No evidence of inducible myocardial ischemia  . THYROIDECTOMY, PARTIAL    . torn meniscus in right knee surgery       Current Medications: Outpatient Medications Prior to Visit  Medication Sig  Dispense Refill  . Ascorbic Acid (VITAMIN C) 1000 MG tablet Take 1,000 mg by mouth daily.    Marland Kitchen aspirin EC 81 MG tablet Take 81 mg by mouth daily.    Marland Kitchen atorvastatin (LIPITOR) 80 MG tablet Take 1 tablet (80 mg total) by mouth daily. 30 tablet 0  . CALCIUM PO Take 1,200 mg by mouth daily.    . carboxymethylcellulose (REFRESH PLUS) 0.5 % SOLN Place 1 drop into both eyes daily as needed (dry eyes).     . cetirizine (ZYRTEC) 10 MG tablet Take 10 mg by mouth daily.    . cholecalciferol (VITAMIN D) 1000 units tablet Take 1,000 Units by mouth daily.    Marland Kitchen CINNAMON PO Take 1,000 mg by mouth daily.     . clindamycin (CLEOCIN) 300 MG capsule Take 2 tablets (600 mg total) 30-60 minutes prior to dental or medical procedure. 8 capsule 1  . Multiple Vitamins-Minerals (MULTIVITAMIN WITH MINERALS) tablet Take 1 tablet by mouth daily.    . Omega-3 Krill Oil 500 MG CAPS Take 500 mg by mouth daily.     . ramipril (ALTACE) 10 MG capsule TAKE 1 CAPSULE BY MOUTH EVERY DAY 30 capsule 0  . vitamin B-12 (CYANOCOBALAMIN) 1000 MCG tablet Take 1,000 mcg by mouth daily.    Marland Kitchen warfarin (COUMADIN) 10 MG tablet Take 1 to 1 and 1/2 tablets daily as directed by coumadin clinic 45 tablet 2   No facility-administered medications prior to visit.      Allergies:   Amlodipine, Tetanus toxoids, Crestor [rosuvastatin], and Penicillins   Social History   Socioeconomic History  . Marital status: Married    Spouse name: Not on file  . Number of children: Not on file  . Years of education: Not on file  . Highest education level: Not on file  Occupational History  . Not on file  Social Needs  . Financial resource strain: Not on file  . Food insecurity    Worry: Not on file    Inability: Not on file  . Transportation needs    Medical: Not on file    Non-medical: Not on file  Tobacco Use  . Smoking status: Former Smoker    Types: Cigarettes    Quit date: 04/11/1987    Years since quitting: 31.6  . Smokeless tobacco: Never  Used  Substance and Sexual Activity  . Alcohol use: No  . Drug use: No  . Sexual activity: Not on file  Lifestyle  . Physical activity    Days per week: Not on file    Minutes per session: Not on file  . Stress: Not on file  Relationships  . Social Herbalist on phone: Not on file    Gets together: Not on file    Attends religious service: Not on file    Active member of  club or organization: Not on file    Attends meetings of clubs or organizations: Not on file    Relationship status: Not on file  Other Topics Concern  . Not on file  Social History Narrative  . Not on file     Family History:  The patient's family history includes Cancer in his father.   ROS:   Please see the history of present illness.    All other systems are reviewed and are negative  PHYSICAL EXAM:   VS:  BP 133/78   Pulse (!) 54   Temp 97.7 F (36.5 C)   Ht 5\' 11"  (1.803 m)   Wt 179 lb (81.2 kg)   SpO2 98%   BMI 24.97 kg/m      General: Alert, oriented x3, no distress, lean, appears younger than stated age Head: no evidence of trauma, PERRL, EOMI, no exophtalmos or lid lag, no myxedema, no xanthelasma; normal ears, nose and oropharynx Neck: normal jugular venous pulsations and no hepatojugular reflux; brisk carotid pulses without delay and no carotid bruits Chest: clear to auscultation, no signs of consolidation by percussion or palpation, normal fremitus, symmetrical and full respiratory excursions Cardiovascular: normal position and quality of the apical impulse, regular rhythm, crisp prosthetic valve clicks, early peaking systolic ejection murmur in the aortic focus, no diastolic murmurs, rubs or gallops Abdomen: no tenderness or distention, no masses by palpation, no abnormal pulsatility or arterial bruits, normal bowel sounds, no hepatosplenomegaly Extremities: no clubbing, cyanosis or edema; 2+ radial, ulnar and brachial pulses bilaterally; 2+ right femoral, posterior tibial and  dorsalis pedis pulses; 2+ left femoral, posterior tibial and dorsalis pedis pulses; no subclavian or femoral bruits Neurological: grossly nonfocal Psych: Normal mood and affect    Wt Readings from Last 3 Encounters:  12/04/18 179 lb (81.2 kg)  11/21/18 177 lb 12.8 oz (80.6 kg)  11/17/18 178 lb (80.7 kg)      Studies/Labs Reviewed:   EKG:  EKG is not ordered today.  The ekg ordered 11/17/2018 demonstrates sinus rhythm with one PAC,  old right bundle branch block (QRS 134 ms), QTC 435 ms  BMET    Component Value Date/Time   NA 137 11/11/2018 1836   NA 141 10/28/2018 1256   K 3.9 11/11/2018 1836   CL 107 11/11/2018 1836   CO2 25 11/11/2018 1836   GLUCOSE 120 (H) 11/11/2018 1836   BUN 11 11/11/2018 1836   BUN 10 10/28/2018 1256   CREATININE 0.81 11/11/2018 1836   CALCIUM 8.7 (L) 11/17/2018 1305   GFRNONAA >60 11/11/2018 1836   GFRAA >60 11/11/2018 1836    Lipid Panel  No results found for: CHOL, TRIG, HDL, CHOLHDL, VLDL, LDLCALC, LDLDIRECT  08/13/2017 CHOL 162, HDL 47, LDL 99, TG 75  ASSESSMENT:    1. Near syncope   2. Coronary artery disease involving coronary bypass graft of native heart without angina pectoris   3. Essential hypertension   4. Hypercholesterolemia   5. Postoperative atrial fibrillation (HCC)   6. OSA (obstructive sleep apnea)   7. Ascending aortic aneurysm ([place])   8. History of prosthetic aortic valve replacement   9. Long term current use of anticoagulant therapy   10. Daytime somnolence      PLAN:  In order of problems listed above:  1. Near-syncope: Symptoms are most suggestive of vasovagal syncope.  Encouraged him to stay very well-hydrated, avoid prolonged orthostasis, avoid extreme heat, any other triggers that he can identify.  Waiting for the full  report of his arrhythmia monitor, but so far no meaningful findings. 2. CAD s/p CABG: Asymptomatic. He had a normal stress echo 04/2015.  3. HTN: Well-controlled, should avoid diuretics 4.  HLP: On high-dose atorvastatin, target LDL <70. 5. Postop AF: Required cardioversion in 2009 without any clinically evident recurrence since that time. He is on anticoagulation anyway for his prosthetic aortic valve.   6. OSA: He stopped using his CPAP a few years ago when he was told that he does not need it unless symptomatic.  However, he describes very typical daytime hypersomnolence.  His echocardiogram shows dilated right heart chambers and a degree of right ventricular dilation seems to be progressively worse.  I think we need to reevaluate his need for CPAP.  Scheduled for an in-home titration.  He still has his equipment, which he purchased years ago. 7. Ao aneurysm s/p Bentall repair: No evidence of other areas of aortic dilation on 2015 CT angiogram.  Recent CT angiogram shows stable aneurysm size on several serial studies, will start checking every 5 years. 8. Mechanical AVR: Normal prosthetic valve function on very recent echo. 9. Warfarin: Compliant with home INR monitoring, no recent falls or injuries or bleeding problems    Medication Adjustments/Labs and Tests Ordered: Current medicines are reviewed at length with the patient today.  Concerns regarding medicines are outlined above.  Medication changes, Labs and Tests ordered today are listed in the Patient Instructions below. Patient Instructions  Medication Instructions:  Your physician recommends that you continue on your current medications as directed. Please refer to the Current Medication list given to you today.  If you need a refill on your cardiac medications before your next appointment, please call your pharmacy.   Lab work: None ordered If you have labs (blood work) drawn today and your tests are completely normal, you will receive your results only by: [place] (if you have MyChart) OR A paper copy in the mail If you have any lab test that is abnormal or we need to change your treatment, we will call you to  review the results.  Testing/Procedures: Your physician has recommended that you have an in home sleep study. This test records several body functions during sleep, including: brain activity, eye movement, oxygen and carbon dioxide blood levels, heart rate and rhythm, breathing rate and rhythm, the flow of air through your mouth and nose, snoring, body muscle movements, and chest and belly movement.  Follow-Up: At [facility], you and your health needs are our priority.  As part of our continuing mission to provide you with exceptional heart care, we have created designated Provider Care Teams.  These Care Teams include your primary Cardiologist (physician) and Advanced Practice Providers (APPs -  Physician Assistants and Nurse Practitioners) who all work together to provide you with the care you need, when you need it. You will need a follow up appointment in 3 months.  You may see [name], MD or one of the following Advanced Practice Providers on your designated Care Team: Almyra Deforest, [place] Fabian Sharp, [place]  Any Other Special Instructions Will Be Listed Below (If Applicable). Dr. Sallyanne Kuster would like for you to have an in home sleep study. We will call you to schedule this.  Dr. Sallyanne Kuster would also like for you to have an in home CPAP titration. We will call you to schedule this.          Signed, [name], MD  12/06/2018 4:02 PM    [facility] [place], [place], [place]  [address] Phone: [phone]; Fax: [phone]  \

## 2018-12-06 ENCOUNTER — Encounter: Payer: Self-pay | Admitting: Cardiovascular Disease

## 2018-12-10 DIAGNOSIS — H903 Sensorineural hearing loss, bilateral: Secondary | ICD-10-CM | POA: Diagnosis not present

## 2018-12-10 DIAGNOSIS — J3089 Other allergic rhinitis: Secondary | ICD-10-CM | POA: Diagnosis not present

## 2018-12-10 DIAGNOSIS — H6123 Impacted cerumen, bilateral: Secondary | ICD-10-CM | POA: Diagnosis not present

## 2018-12-11 DIAGNOSIS — D2371 Other benign neoplasm of skin of right lower limb, including hip: Secondary | ICD-10-CM | POA: Diagnosis not present

## 2018-12-11 DIAGNOSIS — L814 Other melanin hyperpigmentation: Secondary | ICD-10-CM | POA: Diagnosis not present

## 2018-12-11 DIAGNOSIS — D485 Neoplasm of uncertain behavior of skin: Secondary | ICD-10-CM | POA: Diagnosis not present

## 2018-12-11 DIAGNOSIS — D692 Other nonthrombocytopenic purpura: Secondary | ICD-10-CM | POA: Diagnosis not present

## 2018-12-11 DIAGNOSIS — C44219 Basal cell carcinoma of skin of left ear and external auricular canal: Secondary | ICD-10-CM | POA: Diagnosis not present

## 2018-12-11 DIAGNOSIS — L570 Actinic keratosis: Secondary | ICD-10-CM | POA: Diagnosis not present

## 2018-12-11 DIAGNOSIS — L821 Other seborrheic keratosis: Secondary | ICD-10-CM | POA: Diagnosis not present

## 2018-12-12 ENCOUNTER — Telehealth: Payer: Self-pay | Admitting: *Deleted

## 2018-12-12 NOTE — Telephone Encounter (Signed)
Called and left HST appointment details on patient's VM. (ok per dpr)

## 2018-12-12 NOTE — Telephone Encounter (Signed)
-----   Message from Ricci Barker, RN sent at 12/04/2018 12:04 PM EDT ----- Hello,  This patient needs to be scheduled for an in home sleep study.  Thank you, Lattie Haw

## 2018-12-17 LAB — POCT INR: INR: 3.6 — AB (ref 2.0–3.0)

## 2018-12-18 ENCOUNTER — Ambulatory Visit (INDEPENDENT_AMBULATORY_CARE_PROVIDER_SITE_OTHER): Payer: Medicare HMO | Admitting: Pharmacist

## 2018-12-18 DIAGNOSIS — Z7901 Long term (current) use of anticoagulants: Secondary | ICD-10-CM

## 2018-12-18 DIAGNOSIS — R69 Illness, unspecified: Secondary | ICD-10-CM | POA: Diagnosis not present

## 2018-12-18 DIAGNOSIS — Z952 Presence of prosthetic heart valve: Secondary | ICD-10-CM

## 2018-12-18 DIAGNOSIS — Z5181 Encounter for therapeutic drug level monitoring: Secondary | ICD-10-CM

## 2018-12-26 LAB — POCT INR: INR: 3.4 — AB (ref 2.0–3.0)

## 2018-12-27 ENCOUNTER — Ambulatory Visit (INDEPENDENT_AMBULATORY_CARE_PROVIDER_SITE_OTHER): Payer: Medicare HMO | Admitting: Pharmacist Clinician (PhC)/ Clinical Pharmacy Specialist

## 2018-12-27 DIAGNOSIS — Z7901 Long term (current) use of anticoagulants: Secondary | ICD-10-CM

## 2018-12-27 DIAGNOSIS — Z952 Presence of prosthetic heart valve: Secondary | ICD-10-CM

## 2018-12-27 DIAGNOSIS — C44219 Basal cell carcinoma of skin of left ear and external auricular canal: Secondary | ICD-10-CM | POA: Diagnosis not present

## 2018-12-31 DIAGNOSIS — Z961 Presence of intraocular lens: Secondary | ICD-10-CM | POA: Diagnosis not present

## 2018-12-31 DIAGNOSIS — H02834 Dermatochalasis of left upper eyelid: Secondary | ICD-10-CM | POA: Diagnosis not present

## 2018-12-31 DIAGNOSIS — H353132 Nonexudative age-related macular degeneration, bilateral, intermediate dry stage: Secondary | ICD-10-CM | POA: Diagnosis not present

## 2018-12-31 DIAGNOSIS — H02831 Dermatochalasis of right upper eyelid: Secondary | ICD-10-CM | POA: Diagnosis not present

## 2019-01-03 ENCOUNTER — Telehealth: Payer: Self-pay | Admitting: Cardiovascular Disease

## 2019-01-03 NOTE — Telephone Encounter (Signed)
New message   Patient states that he had surgery on ear and he is having nervous issues. Patient would like to talk about getting a prescription for his nerves. Please call to discuss.

## 2019-01-03 NOTE — Telephone Encounter (Signed)
Patient reports he had surgery on his ear and got his flu shot. He reports his nerves are on edge. He is requesting anxiety medication. Explained that this is typically not Rx'ed by cardiology and he should contact PCP - an evaluation of symptoms may be warranted before prescribing a new medication. He voiced understanding. Will send to MD as Juluis Rainier

## 2019-01-03 NOTE — Telephone Encounter (Signed)
Agree - PCP to handle that request

## 2019-01-17 ENCOUNTER — Ambulatory Visit (HOSPITAL_BASED_OUTPATIENT_CLINIC_OR_DEPARTMENT_OTHER): Payer: Medicare HMO | Attending: Cardiovascular Disease | Admitting: Cardiovascular Disease

## 2019-01-17 ENCOUNTER — Ambulatory Visit (INDEPENDENT_AMBULATORY_CARE_PROVIDER_SITE_OTHER): Payer: Medicare HMO | Admitting: Pharmacist Clinician (PhC)/ Clinical Pharmacy Specialist

## 2019-01-17 DIAGNOSIS — I10 Essential (primary) hypertension: Secondary | ICD-10-CM | POA: Insufficient documentation

## 2019-01-17 DIAGNOSIS — Z7901 Long term (current) use of anticoagulants: Secondary | ICD-10-CM | POA: Diagnosis not present

## 2019-01-17 DIAGNOSIS — G4733 Obstructive sleep apnea (adult) (pediatric): Secondary | ICD-10-CM | POA: Insufficient documentation

## 2019-01-17 DIAGNOSIS — Z952 Presence of prosthetic heart valve: Secondary | ICD-10-CM

## 2019-01-17 DIAGNOSIS — Z0100 Encounter for examination of eyes and vision without abnormal findings: Secondary | ICD-10-CM | POA: Diagnosis not present

## 2019-01-17 LAB — POCT INR: INR: 3.6 — AB (ref 2.0–3.0)

## 2019-01-25 ENCOUNTER — Encounter (HOSPITAL_BASED_OUTPATIENT_CLINIC_OR_DEPARTMENT_OTHER): Payer: Self-pay | Admitting: Cardiovascular Disease

## 2019-01-25 NOTE — Procedures (Signed)
      Patient Name: Marcus [name], Marcus [name] Date: 01/18/2019 Gender: Male D.O.B: 03/21/1946 Age (years): 2 Referring Provider: [name] [name] Height (inches): 71 Interpreting Physician: Shelva Majestic MD, ABSM Weight (lbs): 178 RPSGT: Jacolyn Reedy BMI: 25 MRN: [id number] Neck Size: 16.00  CLINICAL INFORMATION Sleep Study Type: HST  Indication for sleep study: Excessive Daytime Sleepiness  Epworth Sleepiness Score: 14  SLEEP STUDY TECHNIQUE A multi-channel overnight portable sleep study was performed. The channels recorded were: nasal airflow, thoracic respiratory movement, and oxygen saturation with a pulse oximetry. Snoring was also monitored.  MEDICATIONS     Ascorbic Acid (VITAMIN C) 1000 MG tablet         aspirin EC 81 MG tablet         atorvastatin (LIPITOR) 80 MG tablet         CALCIUM PO         carboxymethylcellulose (REFRESH PLUS) 0.5 % SOLN         cetirizine (ZYRTEC) 10 MG tablet         cholecalciferol (VITAMIN D) 1000 units tablet         CINNAMON PO         clindamycin (CLEOCIN) 300 MG capsule         Multiple Vitamins-Minerals (MULTIVITAMIN WITH MINERALS) tablet         Omega-3 Krill Oil 500 MG CAPS         ramipril (ALTACE) 10 MG capsule         vitamin B-12 (CYANOCOBALAMIN) 1000 MCG tablet         warfarin (COUMADIN) 10 MG tablet      Patient self administered medications include: N/A.  SLEEP ARCHITECTURE Patient was studied for 458 minutes. The sleep efficiency was 99.9 % and the patient was supine for 90.7%. The arousal index was 0.0 per hour.  RESPIRATORY PARAMETERS The overall AHI was 29.6 per hour, with a central apnea index of 0.3 per hour. There is a significant positional component with supine sleep AHI 32.3/h versus non-supine sleep AHI 2.82/h.  The oxygen nadir was 85% during sleep.  CARDIAC DATA Mean heart rate during sleep was 49.8 bpm.  IMPRESSIONS - Moderately-severe obstructive sleep apnea occurred during this study (AHI  29.6/h). Sleep apnea is severe with supine sleep. - No significant central sleep apnea occurred during this study (CAI = 0.3/h). - Moderate oxygen desaturation to a nadir of 85%. - Patient snored 6.7% during the sleep.  DIAGNOSIS - Obstructive Sleep Apnea (327.23 [G47.33 ICD-10])  RECOMMENDATIONS - In this patient with significant cardiovascular co-morbidities recommend a CPAP titration study. If unable to do an in-lab study then initiate AutoPap with EPR of 3 at 7-20 cm of water with heated humidification.  - Effot should be made to optimize nasal and oropharyngeal patency.  - Positional therapy avoiding supine position during sleep. - Avoid alcohol, sedatives and other CNS depressants that may worsen sleep apnea and disrupt normal sleep architecture. - Sleep hygiene should be reviewed to assess factors that may improve sleep quality. - Weight management and regular exercise should be initiated or continued. - Recommend a download in 30 days after CPAP initiation and sleep clinic evaluation after 4 weeks of therapy.    [Electronically signed] 01/25/2019 11:44 AM  Shelva Majestic MD, [facility], [place], American Board of Sleep Medicine   NPI: [id number] [place] PH: [phone]   FX: [phone] [place]

## 2019-01-29 ENCOUNTER — Other Ambulatory Visit: Payer: Self-pay | Admitting: Cardiovascular Disease

## 2019-01-29 DIAGNOSIS — G4733 Obstructive sleep apnea (adult) (pediatric): Secondary | ICD-10-CM

## 2019-01-29 DIAGNOSIS — I10 Essential (primary) hypertension: Secondary | ICD-10-CM

## 2019-02-01 ENCOUNTER — Encounter: Payer: Self-pay | Admitting: Cardiovascular Disease

## 2019-02-01 LAB — PROTIME-INR: INR: 3.3 — AB (ref 0.9–1.1)

## 2019-02-01 LAB — POCT INR: INR: 3.3 — AB (ref 2.0–3.0)

## 2019-02-03 ENCOUNTER — Ambulatory Visit (INDEPENDENT_AMBULATORY_CARE_PROVIDER_SITE_OTHER): Payer: Medicare HMO | Admitting: Cardiology

## 2019-02-03 ENCOUNTER — Encounter (HOSPITAL_BASED_OUTPATIENT_CLINIC_OR_DEPARTMENT_OTHER): Payer: Medicare HMO | Admitting: Cardiovascular Disease

## 2019-02-03 DIAGNOSIS — Z7901 Long term (current) use of anticoagulants: Secondary | ICD-10-CM

## 2019-02-03 DIAGNOSIS — Z952 Presence of prosthetic heart valve: Secondary | ICD-10-CM | POA: Diagnosis not present

## 2019-02-04 ENCOUNTER — Telehealth: Payer: Self-pay | Admitting: Cardiovascular Disease

## 2019-02-04 NOTE — Telephone Encounter (Signed)
Do we have any results of sleep study-  Will send to sleep coordinator.

## 2019-02-04 NOTE — Telephone Encounter (Signed)
New Message  Patient's wife is calling in for the sleep study results. Please give patient/patient's wife a call back.

## 2019-02-05 NOTE — Telephone Encounter (Signed)
Follow up:    Patient wife calling back concering some results. She has also left message threw My chart. Please call back wife back.

## 2019-02-05 NOTE — Telephone Encounter (Signed)
Returned a call to patient's wife. Gave her HST results and recommendations. She has agreed to CPAP titration study.

## 2019-02-05 NOTE — Telephone Encounter (Signed)
Spoke with pt wife who is awaiting results of pt sleep study. She states she was told that Dr. Sallyanne Kuster would review results with pt. She is eager to know the next steps for pt. Informed her that triage nurse would route message to Dr. Sallyanne Kuster to see if pt will be contacted with results or if pt needs appt to discuss them. Pt wife agreeable

## 2019-02-05 NOTE — Telephone Encounter (Signed)
My understanding is that the next step is for him to have a CPAP titration study.  I believe Dr. Claiborne Billings was setting that up.  I am not sure whether it was going to be performed as an in-home titration study or in the sleep center (the latter option is preferable).  I'm sending a copy of this message to Dr. Shela Leff to [place].

## 2019-02-20 ENCOUNTER — Telehealth: Payer: Self-pay | Admitting: *Deleted

## 2019-02-20 NOTE — Telephone Encounter (Signed)
Patient's wife informed Holland Falling denied CPAP titration. APAP orders will be sent to [facility].

## 2019-02-21 LAB — POCT INR: INR: 3.4 — AB (ref 2.0–3.0)

## 2019-02-24 ENCOUNTER — Ambulatory Visit (INDEPENDENT_AMBULATORY_CARE_PROVIDER_SITE_OTHER): Payer: Medicare HMO | Admitting: Cardiovascular Disease

## 2019-02-24 DIAGNOSIS — Z7901 Long term (current) use of anticoagulants: Secondary | ICD-10-CM | POA: Diagnosis not present

## 2019-02-24 DIAGNOSIS — Z952 Presence of prosthetic heart valve: Secondary | ICD-10-CM

## 2019-02-28 DIAGNOSIS — E785 Hyperlipidemia, unspecified: Secondary | ICD-10-CM | POA: Diagnosis not present

## 2019-02-28 DIAGNOSIS — I10 Essential (primary) hypertension: Secondary | ICD-10-CM | POA: Diagnosis not present

## 2019-02-28 DIAGNOSIS — Z79899 Other long term (current) drug therapy: Secondary | ICD-10-CM | POA: Diagnosis not present

## 2019-02-28 DIAGNOSIS — E291 Testicular hypofunction: Secondary | ICD-10-CM | POA: Diagnosis not present

## 2019-03-03 DIAGNOSIS — G4733 Obstructive sleep apnea (adult) (pediatric): Secondary | ICD-10-CM | POA: Diagnosis not present

## 2019-03-11 ENCOUNTER — Ambulatory Visit: Payer: Medicare HMO | Admitting: Cardiovascular Disease

## 2019-03-11 ENCOUNTER — Other Ambulatory Visit: Payer: Self-pay

## 2019-03-11 VITALS — BP 122/72 | HR 61 | Ht 71.0 in | Wt 180.0 lb

## 2019-03-11 DIAGNOSIS — I480 Paroxysmal atrial fibrillation: Secondary | ICD-10-CM

## 2019-03-11 DIAGNOSIS — E559 Vitamin D deficiency, unspecified: Secondary | ICD-10-CM | POA: Diagnosis not present

## 2019-03-11 DIAGNOSIS — R55 Syncope and collapse: Secondary | ICD-10-CM | POA: Diagnosis not present

## 2019-03-11 DIAGNOSIS — I712 Thoracic aortic aneurysm, without rupture, unspecified: Secondary | ICD-10-CM

## 2019-03-11 DIAGNOSIS — Z7901 Long term (current) use of anticoagulants: Secondary | ICD-10-CM

## 2019-03-11 DIAGNOSIS — I10 Essential (primary) hypertension: Secondary | ICD-10-CM

## 2019-03-11 DIAGNOSIS — G4730 Sleep apnea, unspecified: Secondary | ICD-10-CM | POA: Diagnosis not present

## 2019-03-11 DIAGNOSIS — G4733 Obstructive sleep apnea (adult) (pediatric): Secondary | ICD-10-CM | POA: Diagnosis not present

## 2019-03-11 DIAGNOSIS — Z952 Presence of prosthetic heart valve: Secondary | ICD-10-CM | POA: Diagnosis not present

## 2019-03-11 DIAGNOSIS — I2510 Atherosclerotic heart disease of native coronary artery without angina pectoris: Secondary | ICD-10-CM

## 2019-03-11 DIAGNOSIS — E785 Hyperlipidemia, unspecified: Secondary | ICD-10-CM | POA: Diagnosis not present

## 2019-03-11 DIAGNOSIS — I7121 Aneurysm of the ascending aorta, without rupture: Secondary | ICD-10-CM

## 2019-03-11 DIAGNOSIS — Z79899 Other long term (current) drug therapy: Secondary | ICD-10-CM | POA: Diagnosis not present

## 2019-03-11 DIAGNOSIS — E291 Testicular hypofunction: Secondary | ICD-10-CM | POA: Diagnosis not present

## 2019-03-11 DIAGNOSIS — Z0000 Encounter for general adult medical examination without abnormal findings: Secondary | ICD-10-CM | POA: Diagnosis not present

## 2019-03-11 DIAGNOSIS — E010 Iodine-deficiency related diffuse (endemic) goiter: Secondary | ICD-10-CM | POA: Diagnosis not present

## 2019-03-11 DIAGNOSIS — R911 Solitary pulmonary nodule: Secondary | ICD-10-CM | POA: Diagnosis not present

## 2019-03-11 NOTE — Progress Notes (Signed)
. Patient ID: Marcus [name], male   DOB: Aug 25, 1945, 73 y.o.   MRN: [id number]    Cardiology Office Note    Date:  03/13/2019   ID:  Marcus [name], DOB 1946/02/01, MRN [id number]  PCP:  Norberto Sorenson, MD  Cardiologist:   [name], MD   Chief Complaint  Patient presents with  . Coronary Artery Disease  . Cardiac Valve Problem  . Pacemaker Check    History of Present Illness:  Marcus [name] is a 73 y.o. male who presents for follow-up of aortic valve mechanical prosthesis, previous repair of ascending aortic aneurysm, severe asymptomatic sinus bradycardia and coronary artery disease with previous bypass surgery (Severe AI - Bentall 23 mm St. Jude AVR, CABG x 2 - SVG-RCA and SVG-OM 2008, [place]).  Marcus [name] has been doing quite well.  He is occasionally troubled by palpitations, but only when he lies in bed at night.  They do not associate dyspnea angina or presyncope.  He has not had any further episodes of near syncope.  His arrhythmia monitor was unrevealing, but he was asymptomatic while wearing it.  The patient specifically denies any chest pain at rest exertion, dyspnea at rest or with exertion, orthopnea, paroxysmal nocturnal dyspnea, syncope, focal neurological deficits, intermittent claudication, lower extremity edema, unexplained weight gain, cough, hemoptysis or wheezing.  He had an echocardiogram performed on August 23 shows normal function of his aortic valve prosthesis and preserved left ventricular systolic function.    He is compliant with warfarin anticoagulation and INR monitoring and is consistently therapeutically anticoagulated.  He has never had a stroke or other embolic events.  He had normal echocardiograms in 2014 and 2016 and May 2019 and August 2020, with a mean aortic valve prosthesis gradient of 8-14 mmHg.  CT angiograms have shown stable mild dilation of the aortic root to 39 mm including the study performed on October 29, 2018.  We have decided to follow-up on an  every five-year basis since it has been so stable.  He was diagnosed with sleep apnea based on a study in 2009 he used CPAP for years but then after losing weight was told that he does not need to use it apnea anymore, following a repeat assessment in 2017 that showed only mild sleep.  He still has his own equipment, which he purchased at the time.  He now has clear-cut symptoms of daytime hypersomnolence.  He frequently falls asleep during quiet conversations with his wife.  He is not able to read a book or complete a show on television without falling asleep.  He is no longer driving commercially.  His recent echocardiogram showed enlargement of the right atrium and right ventricle.  Echocardiograms from 2009 in 2014 described the right ventricle is normal in size.  The echocardiograms in 2016 2019 described the right ventricle as mildly dilated.  The most recent echo from August 2020 states that the right ventricle is severely enlarged and the right atrium is also mild-moderately dilated.   Past Medical History:  Diagnosis Date  . Atrial fibrillation ([place])   . Dyspnea    with exertion   . Hypertension   . Obstructive sleep apnea 10/25/2007    no longer on cpap  . S/P AVR    2D ECHO, 04/25/2011 - EF >55%, Right ventricle-mild-moderately dilated  . Swelling of limb    LEA VENOUS, 08/22/2009 - no evidence of deep vein or superficial thrombosis; partially rupturing Baker's Cyst    Past Surgical History:  Procedure Laterality Date  . CARDIAC CATHETERIZATION Bilateral 05/10/2007   Significant 1-vessel disease, severely dilated aortic root with moderate severe aortic insufficiency  . CARDIAC SURGERY    . CARDIOVERSION  08/02/2007   150 Joule biphasic shock with restoration of sinus rhythm. Heart rate 60.  . cataract surgery     . COLONOSCOPY WITH PROPOFOL N/A 12/15/2016   Procedure: COLONOSCOPY WITH PROPOFOL;  Surgeon: Carol Ada, MD;  Location: [facility];  Service: Endoscopy;  Laterality:  N/A;  . NM MYOVIEW LTD  04/08/2007   No evidence of inducible myocardial ischemia  . THYROIDECTOMY, PARTIAL    . torn meniscus in right knee surgery       Current Medications: Outpatient Medications Prior to Visit  Medication Sig Dispense Refill  . Ascorbic Acid (VITAMIN C) 1000 MG tablet Take 1,000 mg by mouth daily.    Marland Kitchen aspirin EC 81 MG tablet Take 81 mg by mouth daily.    Marland Kitchen atorvastatin (LIPITOR) 80 MG tablet Take 1 tablet (80 mg total) by mouth daily. 30 tablet 0  . CALCIUM PO Take 1,200 mg by mouth daily.    . carboxymethylcellulose (REFRESH PLUS) 0.5 % SOLN Place 1 drop into both eyes daily as needed (dry eyes).     . cetirizine (ZYRTEC) 10 MG tablet Take 10 mg by mouth daily.    . cholecalciferol (VITAMIN D) 1000 units tablet Take 1,000 Units by mouth daily.    Marland Kitchen CINNAMON PO Take 1,000 mg by mouth daily.     . clindamycin (CLEOCIN) 300 MG capsule Take 2 tablets (600 mg total) 30-60 minutes prior to dental or medical procedure. 8 capsule 1  . Multiple Vitamins-Minerals (MULTIVITAMIN WITH MINERALS) tablet Take 1 tablet by mouth daily.    . Omega-3 Krill Oil 500 MG CAPS Take 500 mg by mouth daily.     . ramipril (ALTACE) 10 MG capsule TAKE 1 CAPSULE BY MOUTH EVERY DAY 30 capsule 0  . vitamin B-12 (CYANOCOBALAMIN) 1000 MCG tablet Take 1,000 mcg by mouth daily.    Marland Kitchen warfarin (COUMADIN) 10 MG tablet Take 1 to 1 and 1/2 tablets daily as directed by coumadin clinic 45 tablet 2   No facility-administered medications prior to visit.      Allergies:   Amlodipine, Tetanus toxoids, Crestor [rosuvastatin], and Penicillins   Social History   Socioeconomic History  . Marital status: Married    Spouse name: Not on file  . Number of children: Not on file  . Years of education: Not on file  . Highest education level: Not on file  Occupational History  . Not on file  Social Needs  . Financial resource strain: Not on file  . Food insecurity    Worry: Not on file    Inability: Not on  file  . Transportation needs    Medical: Not on file    Non-medical: Not on file  Tobacco Use  . Smoking status: Former Smoker    Types: Cigarettes    Quit date: 04/11/1987    Years since quitting: 31.9  . Smokeless tobacco: Never Used  Substance and Sexual Activity  . Alcohol use: No  . Drug use: No  . Sexual activity: Not on file  Lifestyle  . Physical activity    Days per week: Not on file    Minutes per session: Not on file  . Stress: Not on file  Relationships  . Social connections    Talks on phone: Not on file  Gets together: Not on file    Attends religious service: Not on file    Active member of club or organization: Not on file    Attends meetings of clubs or organizations: Not on file    Relationship status: Not on file  Other Topics Concern  . Not on file  Social History Narrative  . Not on file     Family History:  The patient's family history includes Cancer in his father.   ROS:   Please see the history of present illness.    All other systems are reviewed and are negative.   PHYSICAL EXAM:   VS:  BP 122/72   Pulse 61   Ht 5\' 11"  (1.803 m)   Wt 180 lb (81.6 kg)   SpO2 95%   BMI 25.10 kg/m     General: Alert, oriented x3, no distress, very lean and fit and appears younger than stated age.  Healthy sternotomy scar and left subclavian pacemaker site. Head: no evidence of trauma, PERRL, EOMI, no exophtalmos or lid lag, no myxedema, no xanthelasma; normal ears, nose and oropharynx Neck: normal jugular venous pulsations and no hepatojugular reflux; brisk carotid pulses without delay and no carotid bruits Chest: clear to auscultation, no signs of consolidation by percussion or palpation, normal fremitus, symmetrical and full respiratory excursions Cardiovascular: normal position and quality of the apical impulse, regular rhythm, loud and crisp mechanical prosthetic heart sounds, no murmurs, rubs or gallops Abdomen: no tenderness or distention, no masses  by palpation, no abnormal pulsatility or arterial bruits, normal bowel sounds, no hepatosplenomegaly Extremities: no clubbing, cyanosis or edema; 2+ radial, ulnar and brachial pulses bilaterally; 2+ right femoral, posterior tibial and dorsalis pedis pulses; 2+ left femoral, posterior tibial and dorsalis pedis pulses; no subclavian or femoral bruits Neurological: grossly nonfocal Psych: Normal mood and affect   Wt Readings from Last 3 Encounters:  03/11/19 180 lb (81.6 kg)  12/04/18 179 lb (81.2 kg)  11/21/18 177 lb 12.8 oz (80.6 kg)      Studies/Labs Reviewed:   EKG:  EKG is ordered today.  Shows sinus rhythm with occasional PACs, chronic right bundle branch block, QTC 521 ms (QRS 140 ms) and her   Event monitor 12/11/2018  Dominant rhythm is normal sinus with typical normal circadian variation. At times there is fairly significant nocturnal bradycardia and there is tachycardia with activity.  There is no evidence of atrial fibrillation or of any meaningful ventricular arrhythmia.  No pauses are seen.  There are occasional premature atrial complexes, many times in a pattern of bigeminy.   Mildly abnormal event monitor with periods of moderate sinus bradycardia at night and occasional atrial bigeminy.  Echo 11/21/2018   1. The left ventricle has normal systolic function with an ejection fraction of 60-65%. The cavity size was normal. Mild basal septal hypertrophy. Left ventricular diastolic parameters were normal.  2. The right ventricle has normal systolic function. The cavity was severely enlarged. There is no increase in right ventricular wall thickness. Right ventricular systolic pressure is normal with an estimated pressure of 20.9 mmHg.  3. The interatrial septum appears to be lipomatous.  4. A 44mm St. Jude valve is present in the aortic position. Normal aortic valve prosthesis. AV Mean Grad: 8.4 mmHg. There is trivial perivalvular AI.  5. Compared to prior echo, no  significant change  BMET    Component Value Date/Time   NA 137 11/11/2018 1836   NA 141 10/28/2018 1256   K 3.9 11/11/2018  1836   CL 107 11/11/2018 1836   CO2 25 11/11/2018 1836   GLUCOSE 120 (H) 11/11/2018 1836   BUN 11 11/11/2018 1836   BUN 10 10/28/2018 1256   CREATININE 0.81 11/11/2018 1836   CALCIUM 8.7 (L) 11/17/2018 1305   GFRNONAA >60 11/11/2018 1836   GFRAA >60 11/11/2018 1836    Lipid Panel  No results found for: CHOL, TRIG, HDL, CHOLHDL, VLDL, LDLCALC, LDLDIRECT  08/13/2017 CHOL 162, HDL 47, LDL 99, TG 75  ASSESSMENT:    1. Coronary artery disease involving native coronary artery of native heart without angina pectoris   2. Essential hypertension   3. Paroxysmal atrial fibrillation ([facility])   4. Near syncope   5. OSA (obstructive sleep apnea)   6. Ascending aortic aneurysm ([place])   7. Thoracic aortic aneurysm without rupture ([place])   8. History of prosthetic aortic valve replacement   9. Long term current use of anticoagulant therapy      PLAN:  In order of problems listed above:  1. CAD s/p CABG: Asymptomatic. He had a normal stress echo 04/2015.  2. HTN: Well-controlled with current medications.  Avoid diuretics. 3. HLP: On high-dose atorvastatin.  He had labs performed today with Dr. Merlyn Lot. 4. Postop AF: No clinical recurrence and not seen on his event monitor.  Required cardioversion in 2009 without any clinically evident recurrence since that time. He is on anticoagulation anyway for his prosthetic aortic valve.   5. Near-syncope: No episodes recently.  A couple of episodes occurred over the summer while working outside in the heat.  Suspect these were related to dehydration.  They have not recurred. 6. OSA: His insurance company denied in-home titration. 7. Ao aneurysm s/p Bentall repair: No evidence of other areas of aortic dilation on 2015 CT angiogram.  Recent CT angiogram shows stable aneurysm size on several serial studies, will start checking every  5 years.  His most recent scan did describe dilation of the celiac axis with a short axis of dissection, but this was also stable since 2015. 8. Mechanical AVR: Normal prosthetic valve function on very recent echo. 9. Warfarin: Compliant with INR monitoring and no bleeding problems.  Compliant with home INR monitoring, no recent falls or injuries or bleeding problems    Medication Adjustments/Labs and Tests Ordered: Current medicines are reviewed at length with the patient today.  Concerns regarding medicines are outlined above.  Medication changes, Labs and Tests ordered today are listed in the Patient Instructions below. Patient Instructions  Medication Instructions:  No changes *If you need a refill on your cardiac medications before your next appointment, please call your pharmacy*  Lab Work: None ordered If you have labs (blood work) drawn today and your tests are completely normal, you will receive your results only by: Marland Kitchen MyChart Message (if you have MyChart) OR . A paper copy in the mail If you have any lab test that is abnormal or we need to change your treatment, we will call you to review the results.  Testing/Procedures: Non-Cardiac CT Angiography (CTA), is a special type of CT scan that uses a computer to produce multi-dimensional views of major blood vessels throughout the body. In CT angiography, a contrast material is injected through an IV to help visualize the blood vessels  CTA to be completed in 6 months. We will call you to set this up.  Follow-Up: At [facility], you and your health needs are our priority.  As part of our continuing mission to provide  you with exceptional heart care, we have created designated Provider Care Teams.  These Care Teams include your primary Cardiologist (physician) and Advanced Practice Providers (APPs -  Physician Assistants and Nurse Practitioners) who all work together to provide you with the care you need, when you need it.  Your  next appointment:   12 month(s)  The format for your next appointment:   In Person  Provider:   [name], MD      Signed, [name], MD  03/13/2019 7:06 PM    [address], [place], [place]  [address] Phone: [phone]; Fax: [phone]  \

## 2019-03-11 NOTE — Patient Instructions (Addendum)
Medication Instructions:  No changes *If you need a refill on your cardiac medications before your next appointment, please call your pharmacy*  Lab Work: None ordered If you have labs (blood work) drawn today and your tests are completely normal, you will receive your results only by: Marland Kitchen MyChart Message (if you have MyChart) OR . A paper copy in the mail If you have any lab test that is abnormal or we need to change your treatment, we will call you to review the results.  Testing/Procedures: Non-Cardiac CT Angiography (CTA), is a special type of CT scan that uses a computer to produce multi-dimensional views of major blood vessels throughout the body. In CT angiography, a contrast material is injected through an IV to help visualize the blood vessels  CTA to be completed in 6 months. We will call you to set this up.  Follow-Up: At [facility], you and your health needs are our priority.  As part of our continuing mission to provide you with exceptional heart care, we have created designated Provider Care Teams.  These Care Teams include your primary Cardiologist (physician) and Advanced Practice Providers (APPs -  Physician Assistants and Nurse Practitioners) who all work together to provide you with the care you need, when you need it.  Your next appointment:   12 month(s)  The format for your next appointment:   In Person  Provider:   [name], MD

## 2019-03-12 ENCOUNTER — Other Ambulatory Visit: Payer: Self-pay | Admitting: *Deleted

## 2019-03-12 DIAGNOSIS — I712 Thoracic aortic aneurysm, without rupture: Secondary | ICD-10-CM

## 2019-03-12 DIAGNOSIS — I7121 Aneurysm of the ascending aorta, without rupture: Secondary | ICD-10-CM

## 2019-03-12 DIAGNOSIS — I10 Essential (primary) hypertension: Secondary | ICD-10-CM

## 2019-03-13 ENCOUNTER — Encounter: Payer: Self-pay | Admitting: Cardiovascular Disease

## 2019-03-14 ENCOUNTER — Telehealth: Payer: Self-pay | Admitting: Cardiovascular Disease

## 2019-03-14 NOTE — Telephone Encounter (Signed)
The patient's wife has been made aware. She has been advised to call back or send a MyChart message if they have any further concerns or if they get out of town and have a question. She verbalized her understanding.

## 2019-03-14 NOTE — Telephone Encounter (Signed)
Patient's wife called stating that the patient's medical doctor says he needs a pulmonogist. Patient would like to have Dr.[name]'s opinion.

## 2019-03-14 NOTE — Telephone Encounter (Signed)
Please tell them about back opacity that concerned the PCP is exactly the reason why we are repeating his CT scan in 6 months.  I think the pulmonologist is a good idea, but I do not think that this should prevent him from heading out of town.  They can make the appointment with the pulmonologist after the first of the year.

## 2019-03-14 NOTE — Telephone Encounter (Signed)
Spoke with the patient's wife. She stated that the patient had an appointment with his PCP and he has recommended the patient see a pulmonologist due to the results of his CT in July.  The wife stated that they are heading out of town tomorrow and won't be back until after the new year. They are now hesitant to go out of town because the patient is worried.   Lungs/Pleura: No pleural effusion. No pneumothorax. Pulmonary emphysema. 2.5 cm pleural-based opacity medially in the anterior right upper lobe, previously 1.2 cm. There is some scattered sub solid nodular opacities in the adjacent lung parenchyma. Left lung clear.

## 2019-03-15 LAB — POCT INR: INR: 4.2 — AB (ref 2.0–3.0)

## 2019-03-17 ENCOUNTER — Ambulatory Visit (INDEPENDENT_AMBULATORY_CARE_PROVIDER_SITE_OTHER): Payer: Medicare HMO | Admitting: Cardiology

## 2019-03-17 DIAGNOSIS — Z7901 Long term (current) use of anticoagulants: Secondary | ICD-10-CM

## 2019-03-17 DIAGNOSIS — Z952 Presence of prosthetic heart valve: Secondary | ICD-10-CM

## 2019-03-28 ENCOUNTER — Other Ambulatory Visit: Payer: Self-pay | Admitting: Cardiovascular Disease

## 2019-03-30 LAB — POCT INR: INR: 3.7 — AB (ref 2.0–3.0)

## 2019-04-01 ENCOUNTER — Ambulatory Visit (INDEPENDENT_AMBULATORY_CARE_PROVIDER_SITE_OTHER): Payer: Medicare HMO | Admitting: Cardiology

## 2019-04-01 DIAGNOSIS — Z952 Presence of prosthetic heart valve: Secondary | ICD-10-CM

## 2019-04-01 DIAGNOSIS — Z7901 Long term (current) use of anticoagulants: Secondary | ICD-10-CM | POA: Diagnosis not present

## 2019-04-02 DIAGNOSIS — G4733 Obstructive sleep apnea (adult) (pediatric): Secondary | ICD-10-CM | POA: Diagnosis not present

## 2019-04-15 LAB — POCT INR: INR: 5.2 — AB (ref 2.0–3.0)

## 2019-04-16 ENCOUNTER — Telehealth: Payer: Self-pay | Admitting: *Deleted

## 2019-04-16 NOTE — Telephone Encounter (Signed)
Patient's wife called in to me because she could not get through the switchboard to ask if I will send a message to Dr Sallyanne Kuster that her husband Marcus [name] is experiencing lightheadedness and feeling faint. This is also causing him to be " sick on the stomach."  She would like appointment with Dr Sallyanne Kuster as well as a pulmonology referral , which she states Dr Sallyanne Kuster is aware of. Call will be routed to Dr Sallyanne Kuster for review and recommendations to his scheduler.

## 2019-04-16 NOTE — Telephone Encounter (Signed)
Please make first available appointment. Did they check vital signs when he felt faint?

## 2019-04-17 ENCOUNTER — Ambulatory Visit: Payer: Self-pay | Admitting: Pharmacist Clinician (PhC)/ Clinical Pharmacy Specialist

## 2019-04-17 DIAGNOSIS — Z952 Presence of prosthetic heart valve: Secondary | ICD-10-CM

## 2019-04-17 DIAGNOSIS — Z7901 Long term (current) use of anticoagulants: Secondary | ICD-10-CM

## 2019-04-17 NOTE — Telephone Encounter (Signed)
Spoke with Mrs. Fishbaugh regarding appointment with Dr.Byrum at [facility] 04/21/19 at 1:30 pm--arrival time is  1:15pm--[address]---Phone [phone].  Be sure to wear a mask.. Mrs. Hamric voiced her understanding.

## 2019-04-21 ENCOUNTER — Ambulatory Visit: Payer: Medicare HMO | Admitting: Emergency Medicine

## 2019-04-21 ENCOUNTER — Encounter: Payer: Self-pay | Admitting: Emergency Medicine

## 2019-04-21 ENCOUNTER — Other Ambulatory Visit: Payer: Self-pay

## 2019-04-21 DIAGNOSIS — R9389 Abnormal findings on diagnostic imaging of other specified body structures: Secondary | ICD-10-CM | POA: Diagnosis not present

## 2019-04-21 DIAGNOSIS — R911 Solitary pulmonary nodule: Secondary | ICD-10-CM | POA: Diagnosis not present

## 2019-04-21 DIAGNOSIS — G4733 Obstructive sleep apnea (adult) (pediatric): Secondary | ICD-10-CM

## 2019-04-21 NOTE — Assessment & Plan Note (Signed)
Just restarted CPAP rx, tolerating well.

## 2019-04-21 NOTE — Assessment & Plan Note (Signed)
Irregularly shaped anterior medial right upper lobe opacity at the visceral pleura/pericardium.  There has been some interval increase in size compared with 2015, also some associated adjacent groundglass nodular change.  He denies any infectious symptoms or exposures.  Concerning for possible malignancy given his tobacco history.  We will perform a PET scan now to look for interval change in size, hypermetabolism.  Based on our index of suspicion we will decide whether to follow, pursue navigational bronchoscopy for biopsy, possibly even refer back to Dr. Cyndia Bent for resection.  I will perform pulmonary function testing to assess his degree of obstructive lung disease (minimal symptoms) so that we can factor this in as we decide whether he is a surgical candidate going forward.

## 2019-04-21 NOTE — Progress Notes (Signed)
Subjective:    Patient ID: Marcus [name], male    DOB: [birth year], 74 y.o.   MRN: [id number]  HPI 74 year old former smoker (48 pack years), formerly seen here for obstructive sleep apnea no longer on CPAP.  Also with a history of hypertension, atrial fibrillation, CAD/CABG, repair of an ascending aortic aneurysm and mechanical AVR on anticoagulation.  He is referred today for evaluation of an abnormal CT chest.  CT scan of the chest done on 10/28/2020 evaluate his aorta reviewed.  This shows a somewhat wedgelike 2.5 cm pleural-based opacity medially in the right upper lobe of unclear etiology.  Comparison film done 06/30/2013 shows that the opacity is larger, previously 1.2 cm.  Repeat sleep study done 01/17/2019 showed an AHI 29.6/h, no central apneas.  Restarting CPAP was recommended. He restarted it in November - he has good compliance, and is clinically benefiting.   He has some dyspnea with walking over 3 miles. No fevers, wt loss. No cough. Rarely has some mid-sternal pain, feels like GERD.     Review of Systems  Constitutional: Negative for activity change, appetite change, chills, diaphoresis, fatigue, fever and unexpected weight change.  HENT: Negative for congestion, dental problem, nosebleeds, postnasal drip, rhinorrhea, sinus pressure, sneezing, trouble swallowing and voice change.   Eyes: Negative for itching and visual disturbance.  Respiratory: Positive for shortness of breath. Negative for cough, choking, chest tightness, wheezing and stridor.   Cardiovascular: Negative for chest pain, palpitations and leg swelling.  Gastrointestinal: Negative for abdominal pain.  Musculoskeletal: Negative for joint swelling and myalgias.  Skin: Negative for rash.  Neurological: Negative for syncope, light-headedness and headaches.  Psychiatric/Behavioral: Negative for sleep disturbance.    Past Medical History:  Diagnosis Date  . Atrial fibrillation ([place])   . Dyspnea    with exertion     . Hypertension   . Obstructive sleep apnea 10/25/2007    no longer on cpap  . S/P AVR    2D ECHO, 04/25/2011 - EF >55%, Right ventricle-mild-moderately dilated  . Swelling of limb    LEA VENOUS, 08/22/2009 - no evidence of deep vein or superficial thrombosis; partially rupturing Baker's Cyst     Family History  Problem Relation Age of Onset  . Cancer Father      Social History   Socioeconomic History  . Marital status: Married    Spouse name: Not on file  . Number of children: Not on file  . Years of education: Not on file  . Highest education level: Not on file  Occupational History  . Not on file  Tobacco Use  . Smoking status: Former Smoker    Types: Cigarettes    Quit date: 04/11/1987    Years since quitting: 32.0  . Smokeless tobacco: Never Used  Substance and Sexual Activity  . Alcohol use: No  . Drug use: No  . Sexual activity: Not on file  Other Topics Concern  . Not on file  Social History Narrative  . Not on file   Social Determinants of Health   Financial Resource Strain:   . Difficulty of Paying Living Expenses: Not on file  Food Insecurity:   . Worried About Charity fundraiser in the Last Year: Not on file  . Ran Out of Food in the Last Year: Not on file  Transportation Needs:   . Lack of Transportation (Medical): Not on file  . Lack of Transportation (Non-Medical): Not on file  Physical Activity:   . Days of  Exercise per Week: Not on file  . Minutes of Exercise per Session: Not on file  Stress:   . Feeling of Stress : Not on file  Social Connections:   . Frequency of Communication with Friends and Family: Not on file  . Frequency of Social Gatherings with Friends and Family: Not on file  . Attends Religious Services: Not on file  . Active Member of Clubs or Organizations: Not on file  . Attends Archivist Meetings: Not on file  . Marital Status: Not on file  Intimate Partner Violence:   . Fear of Current or Ex-Partner: Not on file   . Emotionally Abused: Not on file  . Physically Abused: Not on file  . Sexually Abused: Not on file   Worked as a truck driver No military No water damage  Formerly had dogs, never birds.   Allergies  Allergen Reactions  . Amlodipine Anaphylaxis    Weakness and fatigue   . Tetanus Toxoids Anaphylaxis  . Crestor [Rosuvastatin]     Myalgia   . Penicillins Hives    Has patient had a PCN reaction causing immediate rash, facial/tongue/throat swelling, SOB or lightheadedness with hypotension: No Has patient had a PCN reaction causing severe rash involving mucus membranes or skin necrosis: Yes Has patient had a PCN reaction that required hospitalization: No Has patient had a PCN reaction occurring within the last 10 years: No If all of the above answers are "NO", then may proceed with Cephalosporin use.      Outpatient Medications Prior to Visit  Medication Sig Dispense Refill  . Ascorbic Acid (VITAMIN C) 1000 MG tablet Take 1,000 mg by mouth daily.    Marland Kitchen aspirin EC 81 MG tablet Take 81 mg by mouth daily.    Marland Kitchen atorvastatin (LIPITOR) 80 MG tablet Take 1 tablet (80 mg total) by mouth daily. 30 tablet 0  . CALCIUM PO Take 1,200 mg by mouth daily.    . carboxymethylcellulose (REFRESH PLUS) 0.5 % SOLN Place 1 drop into both eyes daily as needed (dry eyes).     . cetirizine (ZYRTEC) 10 MG tablet Take 10 mg by mouth daily.    . cholecalciferol (VITAMIN D) 1000 units tablet Take 1,000 Units by mouth daily.    Marland Kitchen CINNAMON PO Take 1,000 mg by mouth daily.     . Multiple Vitamins-Minerals (MULTIVITAMIN WITH MINERALS) tablet Take 1 tablet by mouth daily.    . ramipril (ALTACE) 10 MG capsule TAKE 1 CAPSULE BY MOUTH EVERY DAY 30 capsule 0  . vitamin B-12 (CYANOCOBALAMIN) 1000 MCG tablet Take 1,000 mcg by mouth daily.    Marland Kitchen warfarin (COUMADIN) 10 MG tablet TAKE 1 TO 1 AND 1/2 TABLETS DAILY AS DIRECTED BY COUMADIN CLINIC 135 tablet 1  . clindamycin (CLEOCIN) 300 MG capsule Take 2 tablets (600 mg  total) 30-60 minutes prior to dental or medical procedure. 8 capsule 1  . Omega-3 Krill Oil 500 MG CAPS Take 500 mg by mouth daily.      No facility-administered medications prior to visit.        Objective:   Physical Exam Vitals:   04/21/19 1340  BP: 120/74  Pulse: (!) 54  Temp: 97.7 F (36.5 C)  TempSrc: Temporal  SpO2: 96%  Weight: 182 lb (82.6 kg)  Height: 5\' 11"  (1.803 m)   Gen: Pleasant, well-nourished, in no distress,  normal affect  ENT: No lesions,  mouth clear,  oropharynx clear, no postnasal drip  Neck: No JVD, no  stridor  Lungs: No use of accessory muscles, no crackles or wheezing on normal respiration, no wheeze on forced expiration  Cardiovascular: RRR, early systolic murmur with a loud mechanical S2  Musculoskeletal: No deformities, no cyanosis or clubbing  Neuro: alert, awake, non focal  Skin: Warm, no lesions or rash      Assessment & Plan:  OSA (obstructive sleep apnea) Just restarted CPAP rx, tolerating well.   Abnormal CT of the chest Irregularly shaped anterior medial right upper lobe opacity at the visceral pleura/pericardium.  There has been some interval increase in size compared with 2015, also some associated adjacent groundglass nodular change.  He denies any infectious symptoms or exposures.  Concerning for possible malignancy given his tobacco history.  We will perform a PET scan now to look for interval change in size, hypermetabolism.  Based on our index of suspicion we will decide whether to follow, pursue navigational bronchoscopy for biopsy, possibly even refer back to Dr. Cyndia Bent for resection.  I will perform pulmonary function testing to assess his degree of obstructive lung disease (minimal symptoms) so that we can factor this in as we decide whether he is a surgical candidate going forward.   Baltazar Apo, MD, PhD 04/21/2019, 2:16 PM [facility] [phone] or if no answer [phone]

## 2019-04-21 NOTE — Patient Instructions (Signed)
We reviewed your CT scans of the chest today. We will perform a PET scan to further evaluate your right upper lobe pulmonary nodule/opacity. We will perform pulmonary function testing. Follow with Dr. Lamonte Sakai next available after your testing so that we can review together and plan next steps.

## 2019-04-23 DIAGNOSIS — R69 Illness, unspecified: Secondary | ICD-10-CM | POA: Diagnosis not present

## 2019-04-25 DIAGNOSIS — Z952 Presence of prosthetic heart valve: Secondary | ICD-10-CM | POA: Diagnosis not present

## 2019-04-30 ENCOUNTER — Ambulatory Visit (HOSPITAL_COMMUNITY)
Admission: RE | Admit: 2019-04-30 | Discharge: 2019-04-30 | Disposition: A | Discharge status: Home / Self Care | Payer: Medicare HMO | Source: Ambulatory Visit | Attending: Emergency Medicine | Admitting: Emergency Medicine

## 2019-04-30 ENCOUNTER — Other Ambulatory Visit: Payer: Self-pay

## 2019-04-30 DIAGNOSIS — Z79899 Other long term (current) drug therapy: Secondary | ICD-10-CM | POA: Insufficient documentation

## 2019-04-30 DIAGNOSIS — R918 Other nonspecific abnormal finding of lung field: Secondary | ICD-10-CM | POA: Insufficient documentation

## 2019-04-30 DIAGNOSIS — G4733 Obstructive sleep apnea (adult) (pediatric): Secondary | ICD-10-CM | POA: Diagnosis not present

## 2019-04-30 DIAGNOSIS — I4891 Unspecified atrial fibrillation: Secondary | ICD-10-CM | POA: Diagnosis not present

## 2019-04-30 DIAGNOSIS — Z87891 Personal history of nicotine dependence: Secondary | ICD-10-CM | POA: Diagnosis not present

## 2019-04-30 DIAGNOSIS — I10 Essential (primary) hypertension: Secondary | ICD-10-CM | POA: Diagnosis not present

## 2019-04-30 DIAGNOSIS — R911 Solitary pulmonary nodule: Secondary | ICD-10-CM | POA: Diagnosis present

## 2019-04-30 LAB — GLUCOSE, CAPILLARY: Glucose-Capillary: 93 mg/dL (ref 70–99)

## 2019-04-30 MED ORDER — FLUDEOXYGLUCOSE F - 18 (FDG) INJECTION
8.8600 | Freq: Once | INTRAVENOUS | Status: AC | PRN
  Administered 2019-04-30: 8.86 via INTRAVENOUS

## 2019-05-02 NOTE — Telephone Encounter (Signed)
Patient wants results of PET explained to him.  No results for staff to see. Patient has results sent to him on Mychart.   Dr. Lamonte Sakai please advise on results  Will route to [facility] also to follow up on

## 2019-05-02 NOTE — Telephone Encounter (Signed)
Pt is returning Dr. Agustina Caroli phone call - please return call at [phone]

## 2019-05-02 NOTE — Telephone Encounter (Signed)
Called the pt to review PET, Left voice mail. Will try him again.

## 2019-05-03 DIAGNOSIS — G4733 Obstructive sleep apnea (adult) (pediatric): Secondary | ICD-10-CM | POA: Diagnosis not present

## 2019-05-05 ENCOUNTER — Ambulatory Visit (INDEPENDENT_AMBULATORY_CARE_PROVIDER_SITE_OTHER): Payer: Medicare HMO | Admitting: Pharmacist Clinician (PhC)/ Clinical Pharmacy Specialist

## 2019-05-05 DIAGNOSIS — Z952 Presence of prosthetic heart valve: Secondary | ICD-10-CM | POA: Diagnosis not present

## 2019-05-05 DIAGNOSIS — Z7901 Long term (current) use of anticoagulants: Secondary | ICD-10-CM

## 2019-05-05 LAB — POCT INR: INR: 2.7 (ref 2.0–3.0)

## 2019-05-05 NOTE — Telephone Encounter (Signed)
Dr. Lamonte Sakai, please advise on the PET scan results. Thanks!

## 2019-05-05 NOTE — Telephone Encounter (Signed)
Attempted to call, no answer, left message  I discussed the PET results with the patient's wife today Plan is for repeat CT chest in May. He has PFT set up for March.

## 2019-05-07 ENCOUNTER — Telehealth (INDEPENDENT_AMBULATORY_CARE_PROVIDER_SITE_OTHER): Payer: Medicare HMO | Admitting: Cardiovascular Disease

## 2019-05-07 ENCOUNTER — Encounter: Payer: Self-pay | Admitting: Cardiovascular Disease

## 2019-05-07 VITALS — BP 127/74 | HR 54 | Ht 71.0 in | Wt 182.0 lb

## 2019-05-07 DIAGNOSIS — G4733 Obstructive sleep apnea (adult) (pediatric): Secondary | ICD-10-CM

## 2019-05-07 DIAGNOSIS — I7121 Aneurysm of the ascending aorta, without rupture: Secondary | ICD-10-CM

## 2019-05-07 DIAGNOSIS — I712 Thoracic aortic aneurysm, without rupture: Secondary | ICD-10-CM | POA: Diagnosis not present

## 2019-05-07 DIAGNOSIS — Z952 Presence of prosthetic heart valve: Secondary | ICD-10-CM

## 2019-05-07 DIAGNOSIS — I10 Essential (primary) hypertension: Secondary | ICD-10-CM

## 2019-05-07 DIAGNOSIS — E7800 Pure hypercholesterolemia, unspecified: Secondary | ICD-10-CM | POA: Diagnosis not present

## 2019-05-07 NOTE — Patient Instructions (Signed)
Medication Instructions:  Continue current medications  *If you need a refill on your cardiac medications before your next appointment, please call your pharmacy*  Lab Work: None Ordered  Testing/Procedures: None ordered  Follow-Up: At [facility], you and your health needs are our priority.  As part of our continuing mission to provide you with exceptional heart care, we have created designated Provider Care Teams.  These Care Teams include your primary Cardiologist (physician) and Advanced Practice Providers (APPs -  Physician Assistants and Nurse Practitioners) who all work together to provide you with the care you need, when you need it.  Your next appointment:   1 year(s)  The format for your next appointment:   In Person  Provider:   Shelva Majestic, MD

## 2019-05-07 NOTE — Progress Notes (Signed)
Virtual Visit via Telephone Note   This visit type was conducted due to national recommendations for restrictions regarding the COVID-19 Pandemic (e.g. social distancing) in an effort to limit this patient's exposure and mitigate transmission in our community.  Due to his co-morbid illnesses, this patient is at least at moderate risk for complications without adequate follow up.  This format is felt to be most appropriate for this patient at this time.  The patient did not have access to video technology/had technical difficulties with video requiring transitioning to audio format only (telephone).  All issues noted in this document were discussed and addressed.  No physical exam could be performed with this format.  Please refer to the patient's chart for his  consent to telehealth for [facility].   Date:  05/07/2019   ID:  Marny Lowenstein, DOB 09/27/1945, MRN [id number]  Patient Location: Home Provider Location: Office  PCP:  Norberto Sorenson, MD  Cardiologist:  Shelva Majestic, MD (sleep); Dr. Orene Desanctis Electrophysiologist:  None   Evaluation Performed:    New sleep evaluation following reinstitution of CPAP therapy  History of Present Illness:    COLESTON DIROSA is a 74 y.o. male with who is followed by Dr. Sallyanne Kuster for his primary cardiology care.  The patient has a history of aortic valve mechanical prosthesis, repair of ascending aortic aneurysm, CAD, and has undergone surgery with Bentall 23 Saint Jude aortic valve replacement for severe AR, and CABG revascularization x2 with an SVG to RCA and SVG to OM in 2008 by Dr. Cyndia Bent.  Patient states that he was diagnosed as having sleep apnea in 2009.  He was being followed by Dr. Tami Ribas at that time.  A sleep study at [facility] had revealed an AHI of 26.5/h.  He had undergone CPAP titration but due to central apneas and Cheyne-Stokes breathing he ultimately was treated with BiPAP auto SV with an EPAP pressure of 12  minimum pressure and IPAP maximum pressure up to 30 cm.  He states he had used BiPAP auto SV for many years.  In June 2015, a download of his BiPAP auto SV revealed excellent benefit with an AHI of 0.2/h with his 90th percentile pressure at 15.7/12 0.6 cm of water.  He was a Administrator for 10 years.  With weight loss he ultimately stopped using therapy.  He has not used therapy for approximately 3 years.  He has subsequently developed increasing symptomatology of sleep apnea.  He would often fall asleep during quiet conversations with his wife.  He was unable to read a book or complete a show on television without falling asleep.  He had seen Dr. Sallyanne Kuster and because of concerns for progressive sleep apnea he underwent repeat evaluation in October 2020 with a home study.  At the time, his Epworth Sleepiness Scale score was 14.  He was found to have moderately severe obstructive sleep apnea with an AHI of 29.6/h with severe sleep apnea with supine sleep (AHI 32.3/h.  Since this was a home study, the severity during REM sleep could not be evaluated.  An in lab CPAP titration study was recommended, but unfortunately this was not approved and he underwent AutoPap therapy.  His recent CPAP set up date was March 03, 2019.  He has been using a ResMed air fit F 30 medium size mask.  His minimum pressure was set at 7 with a maximum CPAP pressure of 20.  95th percentile pressure was 9.9 with a  maximum average pressure of 12.1.  AHI is excellent at 1.3/h.  Compliance was 100% usage with average use at 8 hours and 45 minutes.  Since initiating therapy he feels significantly improved.  He feels much more rested.  His he is unaware of breakthrough snoring.  He no longer is frequently awakening.  He denies residual daytime sleepiness.  He presents for evaluation.   The patient does not have symptoms concerning for COVID-19 infection (fever, chills, cough, or new shortness of breath).    Past Medical History:  Diagnosis  Date  . Atrial fibrillation ([place])   . Dyspnea    with exertion   . Hypertension   . Obstructive sleep apnea 10/25/2007    no longer on cpap  . S/P AVR    2D ECHO, 04/25/2011 - EF >55%, Right ventricle-mild-moderately dilated  . Swelling of limb    LEA VENOUS, 08/22/2009 - no evidence of deep vein or superficial thrombosis; partially rupturing Baker's Cyst   Past Surgical History:  Procedure Laterality Date  . CARDIAC CATHETERIZATION Bilateral 05/10/2007   Significant 1-vessel disease, severely dilated aortic root with moderate severe aortic insufficiency  . CARDIAC SURGERY    . CARDIOVERSION  08/02/2007   150 Joule biphasic shock with restoration of sinus rhythm. Heart rate 60.  . cataract surgery     . COLONOSCOPY WITH PROPOFOL N/A 12/15/2016   Procedure: COLONOSCOPY WITH PROPOFOL;  Surgeon: Carol Ada, MD;  Location: [facility] END[facility]COPY;  Service: Endoscopy;  Laterality: N/A;  . NM MYOVIEW LTD  04/08/2007   No evidence of inducible myocardial ischemia  . THYROIDECTOMY, PARTIAL    . torn meniscus in right knee surgery        Current Meds  Medication Sig  . Ascorbic Acid (VITAMIN C) 1000 MG tablet Take 1,000 mg by mouth daily.  Marland Kitchen aspirin EC 81 MG tablet Take 81 mg by mouth daily.  Marland Kitchen atorvastatin (LIPITOR) 80 MG tablet Take 1 tablet (80 mg total) by mouth daily.  Marland Kitchen CALCIUM PO Take 1,200 mg by mouth daily.  . carboxymethylcellulose (REFRESH PLUS) 0.5 % SOLN Place 1 drop into both eyes daily as needed (dry eyes).   . cetirizine (ZYRTEC) 10 MG tablet Take 10 mg by mouth daily.  . cholecalciferol (VITAMIN D) 1000 units tablet Take 1,000 Units by mouth daily.  Marland Kitchen CINNAMON PO Take 1,000 mg by mouth daily.   . clindamycin (CLEOCIN) 300 MG capsule Take 2 tablets (600 mg total) 30-60 minutes prior to dental or medical procedure.  . Multiple Vitamins-Minerals (MULTIVITAMIN WITH MINERALS) tablet Take 1 tablet by mouth daily.  . Omega-3 Krill Oil 500 MG CAPS Take 500 mg by mouth daily.   . ramipril  (ALTACE) 10 MG capsule TAKE 1 CAPSULE BY MOUTH EVERY DAY  . vitamin B-12 (CYANOCOBALAMIN) 1000 MCG tablet Take 1,000 mcg by mouth daily.  Marland Kitchen warfarin (COUMADIN) 10 MG tablet TAKE 1 TO 1 AND 1/2 TABLETS DAILY AS DIRECTED BY COUMADIN CLINIC     Allergies:   Amlodipine, Tetanus toxoids, Crestor [rosuvastatin], and Penicillins   Social History   Tobacco Use  . Smoking status: Former Smoker    Types: Cigarettes    Quit date: 04/11/1987    Years since quitting: 32.0  . Smokeless tobacco: Never Used  Substance Use Topics  . Alcohol use: No  . Drug use: No     Family Hx: The patient's family history includes Cancer in his father.  ROS:   Please see the history of present illness.  No fevers chills or night sweats No cough or wheezing Stable heart rhythm No abdominal pain No bleeding on warfarin Sleeping significantly improved with resolution of snoring, daytime sleepiness, frequent awakenings and improvement in nocturia All other systems reviewed and are negative.   Prior CV studies:   The following studies were reviewed today: I reviewed the patient's initial [place] heart sleep study evaluations, prior downloads, new sleep evaluation, and obtained a new download from April 04, 2019 through May 03, 2019 which shows an AHI of 1.3/h as noted above.  I reviewed his medical records of Dr. Sallyanne Kuster and his most recent echo Doppler study from August 2020 which showed an EF of 60 to 65%.  He had a well-seated Saint Jude valve with a mean gradient of 8.4.  Labs/Other Tests and Data Reviewed:    EKG:  An ECG dated March 11, 2019 was personally reviewed today and demonstrated:  Sinus rhythm at 61 with occasional PACs, right bundle branch block, QTC 521 with a QRS interval of 140 ms  Recent Labs: 11/11/2018: BUN 11; Creatinine, Ser 0.81; Hemoglobin 13.6; Platelets 136; Potassium 3.9; Sodium 137 11/17/2018: Magnesium 2.1; TSH 0.846   Recent Lipid Panel No results found for: CHOL,  TRIG, HDL, CHOLHDL, LDLCALC, LDLDIRECT  Wt Readings from Last 3 Encounters:  05/07/19 182 lb (82.6 kg)  04/21/19 182 lb (82.6 kg)  03/11/19 180 lb (81.6 kg)     Objective:    Vital Signs:  BP 127/74   Pulse (!) 54   Ht 5\' 11"  (1.803 m)   Wt 182 lb (82.6 kg)   SpO2 96%   BMI 25.38 kg/m   Since this was a telemedicine visit I could not physically examine the patient. Breathing was normal and not labored There was no audible wheezing Cardiovascular please palpation There was no chest pain He denied abdominal pains There was no swelling His sleep was significantly improved with reinstitution of CPAP therapy He had normal affect and mood   ASSESSMENT & PLAN:    1. OSA: Patient history of sleep apnea dates back to 2009 when he initially required BiPAP auto SV due to central apneas and Cheyne-Stokes respiration noted on his CPAP titration study.  He had used therapy and in 2015 download data verified excellent compliance and at the time 90th percentile pressure was 15.7/12.6 with a maximum titrated pressure to 20.9/18.9.  He had lost weight.  He had not been on therapy for over 3 years but developed recurrent symptomatology.  He had recently undergone a home study and I reviewed this with him in detail.  Unfortunately he did not have an in lab CPAP titration which would have been helpful in light of his prior history.  However he seems to be doing well with CPAP alone and his most recent download confirms excellent compliance with 95th percentile pressure at 9.9 with maximum average pressure of 12.1 giving an AHI of 1.3.  He is using a ResMed air fit F 30 mask.  There is no significant leak.  He is sleeping over 8 hours duration on average.  Presently I will not make any adjustments to his settings since he is doing well. 2. CAD, status post CABG revascularization: Currently asymptomatic.  Note anginal symptoms or exertional dyspnea. 3. Status post Bentall repair recent CT angiogram showed  stable aneurysm size. 4. Hyperlipidemia: He continues to be on atorvastatin followed by Dr. Spero Curb in [place]. 5. Status post AVR: Echo data reviewed  COVID-19 Education: The signs and symptoms  of COVID-19 were discussed with the patient and how to seek care for testing (follow up with PCP or arrange E-visit).  The importance of social distancing was discussed today.  Time:   Today, I have spent 23 minutes with the patient with telehealth technology discussing the above problems.     Medication Adjustments/Labs and Tests Ordered: Current medicines are reviewed at length with the patient today.  Concerns regarding medicines are outlined above.   Tests Ordered: No orders of the defined types were placed in this encounter.   Medication Changes: No orders of the defined types were placed in this encounter.   Follow Up: 1 year  Signed, Shelva Majestic, MD  05/07/2019 10:55 AM    [place]

## 2019-05-20 DIAGNOSIS — G4733 Obstructive sleep apnea (adult) (pediatric): Secondary | ICD-10-CM | POA: Diagnosis not present

## 2019-05-21 ENCOUNTER — Telehealth: Payer: Self-pay | Admitting: Emergency Medicine

## 2019-05-21 NOTE — Telephone Encounter (Signed)
Dr. Lamonte Sakai please see FYI from patient's wife. Not sure if you are aware of this.

## 2019-05-23 LAB — POCT INR: INR: 3.1 — AB (ref 2.0–3.0)

## 2019-05-23 NOTE — Telephone Encounter (Signed)
OK thank you for the information

## 2019-05-26 ENCOUNTER — Ambulatory Visit (INDEPENDENT_AMBULATORY_CARE_PROVIDER_SITE_OTHER): Payer: Medicare HMO | Admitting: Cardiovascular Disease

## 2019-05-26 DIAGNOSIS — Z7901 Long term (current) use of anticoagulants: Secondary | ICD-10-CM | POA: Diagnosis not present

## 2019-05-26 DIAGNOSIS — Z952 Presence of prosthetic heart valve: Secondary | ICD-10-CM

## 2019-06-03 DIAGNOSIS — G4733 Obstructive sleep apnea (adult) (pediatric): Secondary | ICD-10-CM | POA: Diagnosis not present

## 2019-06-07 ENCOUNTER — Other Ambulatory Visit (HOSPITAL_COMMUNITY)
Admission: RE | Admit: 2019-06-07 | Discharge: 2019-06-07 | Disposition: A | Discharge status: Home / Self Care | Payer: Medicare HMO | Source: Ambulatory Visit | Attending: Emergency Medicine | Admitting: Emergency Medicine

## 2019-06-07 DIAGNOSIS — Z01812 Encounter for preprocedural laboratory examination: Secondary | ICD-10-CM | POA: Insufficient documentation

## 2019-06-07 DIAGNOSIS — Z20822 Contact with and (suspected) exposure to covid-19: Secondary | ICD-10-CM | POA: Diagnosis not present

## 2019-06-07 LAB — SARS CORONAVIRUS 2 (TAT 6-24 HRS): SARS Coronavirus 2: NEGATIVE

## 2019-06-10 ENCOUNTER — Other Ambulatory Visit: Payer: Self-pay

## 2019-06-10 ENCOUNTER — Ambulatory Visit (INDEPENDENT_AMBULATORY_CARE_PROVIDER_SITE_OTHER): Payer: Medicare HMO | Admitting: Emergency Medicine

## 2019-06-10 DIAGNOSIS — R911 Solitary pulmonary nodule: Secondary | ICD-10-CM | POA: Diagnosis not present

## 2019-06-10 LAB — PULMONARY FUNCTION TEST
DL/VA % pred: 86 %
DL/VA: 3.46 ml/min/mmHg/L
DLCO unc % pred: 110 %
DLCO unc: 27.91 ml/min/mmHg
FEF 25-75 Post: 2.87 L/sec
FEF 25-75 Pre: 2.68 L/sec
FEF2575-%Change-Post: 6 %
FEF2575-%Pred-Post: 126 %
FEF2575-%Pred-Pre: 117 %
FEV1-%Change-Post: 1 %
FEV1-%Pred-Post: 121 %
FEV1-%Pred-Pre: 120 %
FEV1-Post: 3.79 L
FEV1-Pre: 3.73 L
FEV1FVC-%Change-Post: 2 %
FEV1FVC-%Pred-Pre: 101 %
FEV6-%Change-Post: 0 %
FEV6-%Pred-Post: 123 %
FEV6-%Pred-Pre: 124 %
FEV6-Post: 4.95 L
FEV6-Pre: 4.99 L
FEV6FVC-%Change-Post: 0 %
FEV6FVC-%Pred-Post: 105 %
FEV6FVC-%Pred-Pre: 104 %
FVC-%Change-Post: 0 %
FVC-%Pred-Post: 117 %
FVC-%Pred-Pre: 118 %
FVC-Post: 5.01 L
FVC-Pre: 5.06 L
Post FEV1/FVC ratio: 76 %
Post FEV6/FVC ratio: 99 %
Pre FEV1/FVC ratio: 74 %
Pre FEV6/FVC Ratio: 99 %
RV % pred: 153 %
RV: 3.89 L
TLC % pred: 131 %
TLC: 9.24 L

## 2019-06-10 NOTE — Progress Notes (Signed)
Full PFT performed today. °

## 2019-06-16 ENCOUNTER — Other Ambulatory Visit: Payer: Self-pay

## 2019-06-16 ENCOUNTER — Ambulatory Visit: Payer: Medicare HMO | Admitting: Emergency Medicine

## 2019-06-16 ENCOUNTER — Encounter: Payer: Self-pay | Admitting: Emergency Medicine

## 2019-06-16 VITALS — BP 108/64 | HR 54 | Temp 97.5°F | Ht 71.0 in | Wt 185.2 lb

## 2019-06-16 DIAGNOSIS — K469 Unspecified abdominal hernia without obstruction or gangrene: Secondary | ICD-10-CM | POA: Insufficient documentation

## 2019-06-16 DIAGNOSIS — G4733 Obstructive sleep apnea (adult) (pediatric): Secondary | ICD-10-CM | POA: Diagnosis not present

## 2019-06-16 DIAGNOSIS — K439 Ventral hernia without obstruction or gangrene: Secondary | ICD-10-CM | POA: Diagnosis not present

## 2019-06-16 DIAGNOSIS — J449 Chronic obstructive pulmonary disease, unspecified: Secondary | ICD-10-CM

## 2019-06-16 DIAGNOSIS — R9389 Abnormal findings on diagnostic imaging of other specified body structures: Secondary | ICD-10-CM

## 2019-06-16 MED ORDER — SPIRIVA RESPIMAT 2.5 MCG/ACT IN AERS: 1.0000 | INHALATION_SPRAY | Freq: Every day | RESPIRATORY_TRACT | 0 refills | Status: DC

## 2019-06-16 NOTE — Progress Notes (Signed)
Subjective:    Patient ID: Marcus [name], male    DOB: Jun 19, 1945, 74 y.o.   MRN: [id number]  HPI 74 year old former smoker (48 pack years), formerly seen here for obstructive sleep apnea no longer on CPAP.  Also with a history of hypertension, atrial fibrillation, CAD/CABG, repair of an ascending aortic aneurysm and mechanical AVR on anticoagulation.  He is referred today for evaluation of an abnormal CT chest.  CT scan of the chest done on 10/29/2018 evaluate his aorta reviewed.  This shows a somewhat wedgelike 2.5 cm pleural-based opacity medially in the right upper lobe of unclear etiology.  Comparison film done 06/30/2013 shows that the opacity is larger, previously 1.2 cm.  Repeat sleep study done 01/17/2019 showed an AHI 29.6/h, no central apneas.  Restarting CPAP was recommended. He restarted it in November - he has good compliance, and is clinically benefiting.   He has some dyspnea with walking over 3 miles. No fevers, wt loss. No cough. Rarely has some mid-sternal pain, feels like GERD.   ROV 06/16/2019 --follow-up visit for 74 year old former smoker for abnormal CT scan of the chest, enlarging wedgelike 2.5 cm pleural-based right upper lobe opacity (compared with 2015).  I performed a PET scan on 04/30/2019, reviewed today, shows persistent peripheral groundglass atelectasis/consolidation in the medial aspect of the right upper lobe, 2.8 cm, with low metabolic activity (SUV max 2.5).  Increased adjacent clustered semisolid nodular disease, suspected to be postinflammatory .He has daily exertional SOB. Also mentions to me a bulge in his supra-umbilical area, not painful.    Review of Systems  Constitutional: Negative for activity change, appetite change, chills, diaphoresis, fatigue, fever and unexpected weight change.  HENT: Negative for congestion, dental problem, nosebleeds, postnasal drip, rhinorrhea, sinus pressure, sneezing, trouble swallowing and voice change.   Eyes: Negative for  itching and visual disturbance.  Respiratory: Positive for shortness of breath. Negative for cough, choking, chest tightness, wheezing and stridor.   Cardiovascular: Negative for chest pain, palpitations and leg swelling.  Gastrointestinal: Negative for abdominal pain.  Musculoskeletal: Negative for joint swelling and myalgias.  Skin: Negative for rash.  Neurological: Negative for syncope, light-headedness and headaches.  Psychiatric/Behavioral: Negative for sleep disturbance.    Past Medical History:  Diagnosis Date  . Atrial fibrillation ([place])   . Dyspnea    with exertion   . Hypertension   . Obstructive sleep apnea 10/25/2007    no longer on cpap  . S/P AVR    2D ECHO, 04/25/2011 - EF >55%, Right ventricle-mild-moderately dilated  . Swelling of limb    LEA VENOUS, 08/22/2009 - no evidence of deep vein or superficial thrombosis; partially rupturing Baker's Cyst     Family History  Problem Relation Age of Onset  . Cancer Father      Social History   Socioeconomic History  . Marital status: Married    Spouse name: Not on file  . Number of children: Not on file  . Years of education: Not on file  . Highest education level: Not on file  Occupational History  . Not on file  Tobacco Use  . Smoking status: Former Smoker    Types: Cigarettes    Quit date: 04/11/1987    Years since quitting: 32.2  . Smokeless tobacco: Never Used  Substance and Sexual Activity  . Alcohol use: No  . Drug use: No  . Sexual activity: Not on file  Other Topics Concern  . Not on file  Social History Narrative  .  Not on file   Social Determinants of Health   Financial Resource Strain:   . Difficulty of Paying Living Expenses: Not on file  Food Insecurity:   . Worried About Charity fundraiser in the Last Year: Not on file  . Ran Out of Food in the Last Year: Not on file  Transportation Needs:   . Lack of Transportation (Medical): Not on file  . Lack of Transportation (Non-Medical): Not on  file  Physical Activity:   . Days of Exercise per Week: Not on file  . Minutes of Exercise per Session: Not on file  Stress:   . Feeling of Stress : Not on file  Social Connections:   . Frequency of Communication with Friends and Family: Not on file  . Frequency of Social Gatherings with Friends and Family: Not on file  . Attends Religious Services: Not on file  . Active Member of Clubs or Organizations: Not on file  . Attends Archivist Meetings: Not on file  . Marital Status: Not on file  Intimate Partner Violence:   . Fear of Current or Ex-Partner: Not on file  . Emotionally Abused: Not on file  . Physically Abused: Not on file  . Sexually Abused: Not on file   Worked as a truck driver No military No water damage  Formerly had dogs, never birds.   Allergies  Allergen Reactions  . Amlodipine Anaphylaxis    Weakness and fatigue   . Tetanus Toxoids Anaphylaxis  . Crestor [Rosuvastatin]     Myalgia   . Penicillins Hives    Has patient had a PCN reaction causing immediate rash, facial/tongue/throat swelling, SOB or lightheadedness with hypotension: No Has patient had a PCN reaction causing severe rash involving mucus membranes or skin necrosis: Yes Has patient had a PCN reaction that required hospitalization: No Has patient had a PCN reaction occurring within the last 10 years: No If all of the above answers are "NO", then may proceed with Cephalosporin use.      Outpatient Medications Prior to Visit  Medication Sig Dispense Refill  . Ascorbic Acid (VITAMIN C) 1000 MG tablet Take 1,000 mg by mouth daily.    Marland Kitchen aspirin EC 81 MG tablet Take 81 mg by mouth daily.    Marland Kitchen atorvastatin (LIPITOR) 80 MG tablet Take 1 tablet (80 mg total) by mouth daily. 30 tablet 0  . CALCIUM PO Take 1,200 mg by mouth daily.    . carboxymethylcellulose (REFRESH PLUS) 0.5 % SOLN Place 1 drop into both eyes daily as needed (dry eyes).     . cetirizine (ZYRTEC) 10 MG tablet Take 10 mg by  mouth daily.    . cholecalciferol (VITAMIN D) 1000 units tablet Take 1,000 Units by mouth daily.    Marland Kitchen CINNAMON PO Take 1,000 mg by mouth daily.     . clindamycin (CLEOCIN) 300 MG capsule Take 2 tablets (600 mg total) 30-60 minutes prior to dental or medical procedure. 8 capsule 1  . Multiple Vitamins-Minerals (MULTIVITAMIN WITH MINERALS) tablet Take 1 tablet by mouth daily.    . Omega-3 Krill Oil 500 MG CAPS Take 500 mg by mouth daily.     . ramipril (ALTACE) 10 MG capsule TAKE 1 CAPSULE BY MOUTH EVERY DAY 30 capsule 0  . vitamin B-12 (CYANOCOBALAMIN) 1000 MCG tablet Take 1,000 mcg by mouth daily.    Marland Kitchen warfarin (COUMADIN) 10 MG tablet TAKE 1 TO 1 AND 1/2 TABLETS DAILY AS DIRECTED BY COUMADIN CLINIC 135  tablet 1   No facility-administered medications prior to visit.        Objective:   Physical Exam Vitals:   06/16/19 1130  BP: 108/64  Pulse: (!) 54  Temp: (!) 97.5 F (36.4 C)  TempSrc: Temporal  SpO2: 100%  Weight: 185 lb 3.2 oz (84 kg)  Height: 5\' 11"  (1.803 m)   Gen: Pleasant, well-nourished, in no distress,  normal affect  ENT: No lesions,  mouth clear,  oropharynx clear, no postnasal drip  Neck: No JVD, no stridor  Lungs: No use of accessory muscles, no crackles or wheezing on normal respiration, no wheeze on forced expiration  Cardiovascular: RRR, early systolic murmur with a loud mechanical S2  Abd: slight bulge above the umbilicus with bearing down and a deep breath. Not clearly a full hernia on my exam. Not painful to palpation  Musculoskeletal: No deformities, no cyanosis or clubbing  Neuro: alert, awake, non focal  Skin: Warm, no lesions or rash      Assessment & Plan:  Abdominal hernia He has a history of an abdominal wall hernia and a repair over 20 years ago in [place].  I do not palpate an overt hernia on exam but he does have an area of bulging that could reflect muscle wall defect.  Painless on palpation.  He is interested in following up with  general surgery to ensure there is nothing needs to do about this.  I will refer him locally at his request.  We will try to get notes from [facility] which I am sure will be helpful in the evaluation  Abnormal CT of the chest Right upper lobe medial nodular opacity adjacent to the aorta that does not show hypermetabolism on PET scan.  Has grown since 2015.  Significance unclear.  I think he needs to continue serial imaging, next in July.  We will adjust the timing of his CT that was planned for May to evaluate his aorta.  OSA (obstructive sleep apnea) Good compliance with his CPAP  COPD (chronic obstructive pulmonary disease) ([place]) I reviewed his pulmonary function testing from 06/13/2019, shows evidence for mild obstruction, hyperinflation.  We will give a therapeutic trial of Spiriva to see if he gets benefit.  If so then we will continue going forward.   Baltazar Apo, MD, PhD 06/16/2019, 12:38 PM [facility] [phone] or if no answer [phone]

## 2019-06-16 NOTE — Assessment & Plan Note (Signed)
Right upper lobe medial nodular opacity adjacent to the aorta that does not show hypermetabolism on PET scan.  Has grown since 2015.  Significance unclear.  I think he needs to continue serial imaging, next in July.  We will adjust the timing of his CT that was planned for May to evaluate his aorta.

## 2019-06-16 NOTE — Assessment & Plan Note (Signed)
I reviewed his pulmonary function testing from 06/13/2019, shows evidence for mild obstruction, hyperinflation.  We will give a therapeutic trial of Spiriva to see if he gets benefit.  If so then we will continue going forward.

## 2019-06-16 NOTE — Assessment & Plan Note (Signed)
He has a history of an abdominal wall hernia and a repair over 20 years ago in [place].  I do not palpate an overt hernia on exam but he does have an area of bulging that could reflect muscle wall defect.  Painless on palpation.  He is interested in following up with general surgery to ensure there is nothing needs to do about this.  I will refer him locally at his request.  We will try to get notes from [facility] which I am sure will be helpful in the evaluation

## 2019-06-16 NOTE — Assessment & Plan Note (Signed)
Good compliance with his CPAP

## 2019-06-16 NOTE — Patient Instructions (Signed)
We will push back your planned CT scan of the chest from May 2021 to July 2021 to follow both your aorta and your right upper lobe pulmonary nodule. We will do a trial of Spiriva 2 puffs once daily to see if this benefit your breathing.  If so then please call our office and we will prescribe this for you through your pharmacy. Continue to use your CPAP every night as you have been doing. Follow with Dr Lamonte Sakai in July after your CT scan to review, or sooner if you have any problems.

## 2019-06-17 ENCOUNTER — Ambulatory Visit (INDEPENDENT_AMBULATORY_CARE_PROVIDER_SITE_OTHER): Payer: Medicare HMO | Admitting: Pharmacist Clinician (PhC)/ Clinical Pharmacy Specialist

## 2019-06-17 DIAGNOSIS — Z952 Presence of prosthetic heart valve: Secondary | ICD-10-CM

## 2019-06-17 DIAGNOSIS — Z7901 Long term (current) use of anticoagulants: Secondary | ICD-10-CM

## 2019-06-17 LAB — POCT INR: INR: 3.8 — AB (ref 2.0–3.0)

## 2019-07-01 DIAGNOSIS — G4733 Obstructive sleep apnea (adult) (pediatric): Secondary | ICD-10-CM | POA: Diagnosis not present

## 2019-07-02 DIAGNOSIS — M6208 Separation of muscle (nontraumatic), other site: Secondary | ICD-10-CM | POA: Diagnosis not present

## 2019-07-09 ENCOUNTER — Ambulatory Visit (INDEPENDENT_AMBULATORY_CARE_PROVIDER_SITE_OTHER): Payer: Medicare HMO | Admitting: Internal Medicine

## 2019-07-09 DIAGNOSIS — Z7901 Long term (current) use of anticoagulants: Secondary | ICD-10-CM

## 2019-07-09 DIAGNOSIS — Z952 Presence of prosthetic heart valve: Secondary | ICD-10-CM | POA: Diagnosis not present

## 2019-07-09 LAB — POCT INR: INR: 5.9 — AB (ref 2.0–3.0)

## 2019-07-14 ENCOUNTER — Other Ambulatory Visit: Payer: Self-pay

## 2019-07-14 MED ORDER — RAMIPRIL 10 MG PO CAPS: ORAL_CAPSULE | ORAL | 0 refills | Status: DC

## 2019-07-16 ENCOUNTER — Ambulatory Visit (INDEPENDENT_AMBULATORY_CARE_PROVIDER_SITE_OTHER): Payer: Medicare HMO | Admitting: Cardiology

## 2019-07-16 DIAGNOSIS — Z952 Presence of prosthetic heart valve: Secondary | ICD-10-CM | POA: Diagnosis not present

## 2019-07-16 DIAGNOSIS — Z7901 Long term (current) use of anticoagulants: Secondary | ICD-10-CM

## 2019-07-16 LAB — POCT INR: INR: 2.4 (ref 2.0–3.0)

## 2019-07-22 ENCOUNTER — Telehealth: Payer: Self-pay | Admitting: Emergency Medicine

## 2019-07-22 MED ORDER — SPIRIVA RESPIMAT 2.5 MCG/ACT IN AERS: 1.0000 | INHALATION_SPRAY | Freq: Every day | RESPIRATORY_TRACT | 5 refills | Status: DC

## 2019-07-22 NOTE — Telephone Encounter (Signed)
Spoke with wife and advised her that I could call in the spiriva to their pharmacy. She agreed and Spiriva was sent to the pharmacy. Nothing further is needed.

## 2019-07-25 ENCOUNTER — Ambulatory Visit (INDEPENDENT_AMBULATORY_CARE_PROVIDER_SITE_OTHER): Payer: Medicare HMO | Admitting: Pharmacist Clinician (PhC)/ Clinical Pharmacy Specialist

## 2019-07-25 DIAGNOSIS — Z7901 Long term (current) use of anticoagulants: Secondary | ICD-10-CM

## 2019-07-25 DIAGNOSIS — Z952 Presence of prosthetic heart valve: Secondary | ICD-10-CM

## 2019-07-25 LAB — POCT INR: INR: 2.7 (ref 2.0–3.0)

## 2019-08-01 DIAGNOSIS — G4733 Obstructive sleep apnea (adult) (pediatric): Secondary | ICD-10-CM | POA: Diagnosis not present

## 2019-08-09 ENCOUNTER — Other Ambulatory Visit: Payer: Self-pay | Admitting: Cardiovascular Disease

## 2019-08-09 LAB — POCT INR: INR: 4.6 — AB (ref 2.0–3.0)

## 2019-08-11 ENCOUNTER — Ambulatory Visit (INDEPENDENT_AMBULATORY_CARE_PROVIDER_SITE_OTHER): Payer: Medicare HMO | Admitting: Cardiology

## 2019-08-11 DIAGNOSIS — Z7901 Long term (current) use of anticoagulants: Secondary | ICD-10-CM

## 2019-08-11 DIAGNOSIS — Z952 Presence of prosthetic heart valve: Secondary | ICD-10-CM

## 2019-08-26 DIAGNOSIS — E010 Iodine-deficiency related diffuse (endemic) goiter: Secondary | ICD-10-CM | POA: Diagnosis not present

## 2019-08-26 DIAGNOSIS — I10 Essential (primary) hypertension: Secondary | ICD-10-CM | POA: Diagnosis not present

## 2019-08-26 DIAGNOSIS — E785 Hyperlipidemia, unspecified: Secondary | ICD-10-CM | POA: Diagnosis not present

## 2019-08-26 DIAGNOSIS — Z79899 Other long term (current) drug therapy: Secondary | ICD-10-CM | POA: Diagnosis not present

## 2019-08-31 DIAGNOSIS — G4733 Obstructive sleep apnea (adult) (pediatric): Secondary | ICD-10-CM | POA: Diagnosis not present

## 2019-09-01 ENCOUNTER — Ambulatory Visit (INDEPENDENT_AMBULATORY_CARE_PROVIDER_SITE_OTHER): Payer: Medicare HMO | Admitting: Pharmacist

## 2019-09-01 DIAGNOSIS — Z7901 Long term (current) use of anticoagulants: Secondary | ICD-10-CM | POA: Diagnosis not present

## 2019-09-01 DIAGNOSIS — Z952 Presence of prosthetic heart valve: Secondary | ICD-10-CM | POA: Diagnosis not present

## 2019-09-01 LAB — POCT INR: INR: 4.1 — AB (ref 2.0–3.0)

## 2019-09-11 DIAGNOSIS — G4733 Obstructive sleep apnea (adult) (pediatric): Secondary | ICD-10-CM | POA: Diagnosis not present

## 2019-09-14 LAB — POCT INR: INR: 4.1 — AB (ref 2.0–3.0)

## 2019-09-16 ENCOUNTER — Ambulatory Visit (INDEPENDENT_AMBULATORY_CARE_PROVIDER_SITE_OTHER): Payer: Medicare HMO | Admitting: Pharmacist

## 2019-09-16 DIAGNOSIS — Z952 Presence of prosthetic heart valve: Secondary | ICD-10-CM | POA: Diagnosis not present

## 2019-09-16 DIAGNOSIS — Z7901 Long term (current) use of anticoagulants: Secondary | ICD-10-CM | POA: Diagnosis not present

## 2019-09-29 LAB — POCT INR: INR: 2.6 (ref 2.0–3.0)

## 2019-10-01 ENCOUNTER — Ambulatory Visit (INDEPENDENT_AMBULATORY_CARE_PROVIDER_SITE_OTHER): Payer: Medicare HMO | Admitting: Cardiology

## 2019-10-01 DIAGNOSIS — Z952 Presence of prosthetic heart valve: Secondary | ICD-10-CM

## 2019-10-01 DIAGNOSIS — Z7901 Long term (current) use of anticoagulants: Secondary | ICD-10-CM | POA: Diagnosis not present

## 2019-10-01 DIAGNOSIS — G4733 Obstructive sleep apnea (adult) (pediatric): Secondary | ICD-10-CM | POA: Diagnosis not present

## 2019-10-12 ENCOUNTER — Other Ambulatory Visit: Payer: Self-pay | Admitting: Cardiovascular Disease

## 2019-10-14 DIAGNOSIS — I10 Essential (primary) hypertension: Secondary | ICD-10-CM | POA: Diagnosis not present

## 2019-10-14 DIAGNOSIS — Z7689 Persons encountering health services in other specified circumstances: Secondary | ICD-10-CM | POA: Diagnosis not present

## 2019-10-14 DIAGNOSIS — Z952 Presence of prosthetic heart valve: Secondary | ICD-10-CM | POA: Diagnosis not present

## 2019-10-14 DIAGNOSIS — E559 Vitamin D deficiency, unspecified: Secondary | ICD-10-CM | POA: Diagnosis not present

## 2019-10-14 DIAGNOSIS — Z0000 Encounter for general adult medical examination without abnormal findings: Secondary | ICD-10-CM | POA: Diagnosis not present

## 2019-10-14 DIAGNOSIS — D696 Thrombocytopenia, unspecified: Secondary | ICD-10-CM | POA: Diagnosis not present

## 2019-10-14 DIAGNOSIS — Z1389 Encounter for screening for other disorder: Secondary | ICD-10-CM | POA: Diagnosis not present

## 2019-10-14 DIAGNOSIS — E291 Testicular hypofunction: Secondary | ICD-10-CM | POA: Diagnosis not present

## 2019-10-22 LAB — POCT INR: INR: 3.6 — AB (ref 2.0–3.0)

## 2019-10-23 ENCOUNTER — Ambulatory Visit (INDEPENDENT_AMBULATORY_CARE_PROVIDER_SITE_OTHER): Payer: Medicare HMO | Admitting: Cardiovascular Disease

## 2019-10-23 DIAGNOSIS — Z952 Presence of prosthetic heart valve: Secondary | ICD-10-CM | POA: Diagnosis not present

## 2019-10-23 DIAGNOSIS — Z7901 Long term (current) use of anticoagulants: Secondary | ICD-10-CM

## 2019-10-27 ENCOUNTER — Other Ambulatory Visit: Payer: Self-pay | Admitting: *Deleted

## 2019-10-27 DIAGNOSIS — I712 Thoracic aortic aneurysm, without rupture, unspecified: Secondary | ICD-10-CM

## 2019-10-28 ENCOUNTER — Other Ambulatory Visit: Payer: Self-pay

## 2019-10-28 DIAGNOSIS — I712 Thoracic aortic aneurysm, without rupture, unspecified: Secondary | ICD-10-CM

## 2019-10-28 LAB — BASIC METABOLIC PANEL
BUN/Creatinine Ratio: 12 (ref 10–24)
BUN: 10 mg/dL (ref 8–27)
CO2: 25 mmol/L (ref 20–29)
Calcium: 8.9 mg/dL (ref 8.6–10.2)
Chloride: 104 mmol/L (ref 96–106)
Creatinine, Ser: 0.81 mg/dL (ref 0.76–1.27)
GFR calc Af Amer: 101 mL/min/{1.73_m2} (ref >59)
GFR calc non Af Amer: 88 mL/min/{1.73_m2} (ref >59)
Glucose: 85 mg/dL (ref 65–99)
Potassium: 5.1 mmol/L (ref 3.5–5.2)
Sodium: 139 mmol/L (ref 134–144)

## 2019-10-29 ENCOUNTER — Encounter: Payer: Self-pay | Admitting: *Deleted

## 2019-10-29 DIAGNOSIS — R69 Illness, unspecified: Secondary | ICD-10-CM | POA: Diagnosis not present

## 2019-10-31 DIAGNOSIS — G4733 Obstructive sleep apnea (adult) (pediatric): Secondary | ICD-10-CM | POA: Diagnosis not present

## 2019-11-05 ENCOUNTER — Other Ambulatory Visit: Payer: Self-pay | Admitting: Cardiovascular Disease

## 2019-11-06 ENCOUNTER — Ambulatory Visit (INDEPENDENT_AMBULATORY_CARE_PROVIDER_SITE_OTHER)
Admission: RE | Admit: 2019-11-06 | Discharge: 2019-11-06 | Disposition: A | Discharge status: Home / Self Care | Payer: Medicare HMO | Source: Ambulatory Visit | Attending: Cardiovascular Disease | Admitting: Cardiovascular Disease

## 2019-11-06 ENCOUNTER — Other Ambulatory Visit: Payer: Self-pay

## 2019-11-06 ENCOUNTER — Telehealth: Payer: Self-pay

## 2019-11-06 DIAGNOSIS — I712 Thoracic aortic aneurysm, without rupture, unspecified: Secondary | ICD-10-CM

## 2019-11-06 DIAGNOSIS — I700 Atherosclerosis of aorta: Secondary | ICD-10-CM | POA: Diagnosis not present

## 2019-11-06 DIAGNOSIS — I2510 Atherosclerotic heart disease of native coronary artery without angina pectoris: Secondary | ICD-10-CM | POA: Diagnosis not present

## 2019-11-06 DIAGNOSIS — I728 Aneurysm of other specified arteries: Secondary | ICD-10-CM | POA: Diagnosis not present

## 2019-11-06 MED ORDER — IOHEXOL 350 MG/ML SOLN
100.0000 mL | Freq: Once | INTRAVENOUS | Status: AC | PRN
  Administered 2019-11-06: 100 mL via INTRAVENOUS

## 2019-11-06 NOTE — Telephone Encounter (Signed)
Incoming call from [facility] with [facility]. Reporting a CT Chest for AAA Repair. Malachy Mood reports that "they saw interval enlargement of anterior medial R upper lobe with ground glass appearance. Adenocarcinoma considered. They recommend Thoracic surgery consult."  Will make DOD aware since Dr. Sallyanne Kuster is not in office.  Per DOD Dr.Kelly, place thoracic surgery consult and make Dr.C aware.

## 2019-11-06 NOTE — Telephone Encounter (Signed)
Thanks. Will also forward to Dr. Lamonte Sakai, who has seen him for this abnormality.

## 2019-11-07 ENCOUNTER — Telehealth: Payer: Self-pay | Admitting: Emergency Medicine

## 2019-11-07 NOTE — Telephone Encounter (Signed)
Called and spoke with pt's wife [name] in regards to pt's CTA which was ordered by Dr. Sallyanne Kuster.  I see the message from Dr. Sallyanne Kuster that the abnormality was in the right upper lung showing that it has worsened/enlarged. Report was sent to our office by Dr. Sallyanne Kuster but Dr. Lamonte Sakai can review the results in pt's epic chart.  Stated to [name] that I was going to send this message to Dr. Lamonte Sakai for him to review and then once we heard from hm in regards to recommendations that we would call them back and she verbalized understanding.  Dr. Lamonte Sakai, please advise on the results of pt's CTA.

## 2019-11-10 NOTE — Telephone Encounter (Signed)
I reviewed the CT with the patient.  The right upper lobe groundglass opacity is slightly larger.  He does have the adequate pulmonary function testing to be a candidate for primary resection.  Dr. Sallyanne Kuster has referred him to thoracic surgery.  Patient is wondering whether he could see Dr. Cyndia Bent since he knows him from a prior procedure.  I will ask [facility] if this is possible -they may rather keep the appointment with Dr. Kipp Brood on 11/18/2019 since he specializes in thoracic cases.  The patient will be contacted by [facility] to solidify the plan.

## 2019-11-17 ENCOUNTER — Ambulatory Visit (INDEPENDENT_AMBULATORY_CARE_PROVIDER_SITE_OTHER): Payer: Medicare HMO | Admitting: Pharmacist Clinician (PhC)/ Clinical Pharmacy Specialist

## 2019-11-17 DIAGNOSIS — Z87891 Personal history of nicotine dependence: Secondary | ICD-10-CM | POA: Diagnosis not present

## 2019-11-17 DIAGNOSIS — I10 Essential (primary) hypertension: Secondary | ICD-10-CM | POA: Diagnosis not present

## 2019-11-17 DIAGNOSIS — Z7901 Long term (current) use of anticoagulants: Secondary | ICD-10-CM | POA: Diagnosis not present

## 2019-11-17 DIAGNOSIS — Z952 Presence of prosthetic heart valve: Secondary | ICD-10-CM

## 2019-11-17 DIAGNOSIS — G4733 Obstructive sleep apnea (adult) (pediatric): Secondary | ICD-10-CM | POA: Diagnosis not present

## 2019-11-17 DIAGNOSIS — I2510 Atherosclerotic heart disease of native coronary artery without angina pectoris: Secondary | ICD-10-CM | POA: Diagnosis not present

## 2019-11-17 DIAGNOSIS — Z7982 Long term (current) use of aspirin: Secondary | ICD-10-CM | POA: Diagnosis not present

## 2019-11-17 DIAGNOSIS — E785 Hyperlipidemia, unspecified: Secondary | ICD-10-CM | POA: Diagnosis not present

## 2019-11-17 DIAGNOSIS — I4891 Unspecified atrial fibrillation: Secondary | ICD-10-CM | POA: Diagnosis not present

## 2019-11-17 DIAGNOSIS — D6869 Other thrombophilia: Secondary | ICD-10-CM | POA: Diagnosis not present

## 2019-11-17 LAB — POCT INR: INR: 3.6 — AB (ref 2.0–3.0)

## 2019-11-18 ENCOUNTER — Other Ambulatory Visit: Payer: Self-pay | Admitting: *Deleted

## 2019-11-18 ENCOUNTER — Encounter: Payer: Self-pay | Admitting: Thoracic Surgery (Cardiothoracic Vascular Surgery)

## 2019-11-18 ENCOUNTER — Other Ambulatory Visit: Payer: Self-pay

## 2019-11-18 ENCOUNTER — Institutional Professional Consult (permissible substitution): Payer: Medicare HMO | Admitting: Thoracic Surgery (Cardiothoracic Vascular Surgery)

## 2019-11-18 VITALS — BP 143/81 | HR 48 | Temp 97.3°F | Resp 16 | Ht 71.0 in | Wt 179.0 lb

## 2019-11-18 DIAGNOSIS — Z8679 Personal history of other diseases of the circulatory system: Secondary | ICD-10-CM

## 2019-11-18 DIAGNOSIS — R918 Other nonspecific abnormal finding of lung field: Secondary | ICD-10-CM

## 2019-11-18 DIAGNOSIS — Z952 Presence of prosthetic heart valve: Secondary | ICD-10-CM

## 2019-11-18 DIAGNOSIS — D381 Neoplasm of uncertain behavior of trachea, bronchus and lung: Secondary | ICD-10-CM | POA: Diagnosis not present

## 2019-11-18 DIAGNOSIS — Z7901 Long term (current) use of anticoagulants: Secondary | ICD-10-CM | POA: Diagnosis not present

## 2019-11-18 DIAGNOSIS — Z9889 Other specified postprocedural states: Secondary | ICD-10-CM | POA: Diagnosis not present

## 2019-11-18 DIAGNOSIS — Z951 Presence of aortocoronary bypass graft: Secondary | ICD-10-CM

## 2019-11-18 NOTE — Progress Notes (Signed)
[address]       [name] [id number]             [phone]                    Marcus [name] [place] Medical Record #[id number] Date of Birth: [birth year]  Referring: Marcus Sine, MD Primary Care: Marcus Sorenson, MD Primary Cardiologist: No primary care provider on file.  Chief Complaint:    Chief Complaint  Patient presents with  . Lung Lesion    evaluate RULobe lesion per CTA 11/06/19, PET 04/30/19, PFT 06/10/19    History of Present Illness:    Marcus [name] 74 y.o. male 74 year old male referred by Marcus [name] for surgical evaluation of 3.9 cm right upper lobe pulmonary mass. This was originally found on cross-sectional imaging back in July 2020. On repeat imaging visits around 2.5 cm adenoma PET scan from January 2021 had an SUV max 2.4. He is a former smoker who quit in 1970s. He is asymptomatic from a respiratory standpoint. He denies any weight changes or any neurologic symptoms. In 2009 he underwent aortic valve replacement with mechanical valve by Dr. Caffie [name]. He has had no issues since then. He is somewhat hesitant to discuss surgical options.    Smoking Hx: Former smoker   Zubrod Score: At the time of surgery this patient's most appropriate activity status/level should be described as: [x]     0    Normal activity, no symptoms []     1    Restricted in physical strenuous activity but ambulatory, able to do out light work []     2    Ambulatory and capable of self care, unable to do work activities, up and about               >50 % of waking hours                              []     3    Only limited self care, in bed greater than 50% of waking hours []     4    Completely disabled, no self care, confined to bed or chair []     5    Moribund   Past Medical History:  Diagnosis Date  . Atrial fibrillation ([place])   . Dyspnea    with exertion   . Hypertension   . Obstructive sleep apnea 10/25/2007    no longer on cpap  . S/P AVR    2D ECHO,  04/25/2011 - EF >55%, Right ventricle-mild-moderately dilated  . Swelling of limb    LEA VENOUS, 08/22/2009 - no evidence of deep vein or superficial thrombosis; partially rupturing Baker's Cyst    Past Surgical History:  Procedure Laterality Date  . CARDIAC CATHETERIZATION Bilateral 05/10/2007   Significant 1-vessel disease, severely dilated aortic root with moderate severe aortic insufficiency  . CARDIAC SURGERY    . CARDIOVERSION  08/02/2007   150 Joule biphasic shock with restoration of sinus rhythm. Heart rate 60.  . cataract surgery     . COLONOSCOPY WITH PROPOFOL N/A 12/15/2016   Procedure: COLONOSCOPY WITH PROPOFOL;  Surgeon: Marcus Ada, MD;  Location: [facility];  Service: Endoscopy;  Laterality: N/A;  . NM MYOVIEW LTD  04/08/2007   No evidence of inducible myocardial ischemia  . THYROIDECTOMY, PARTIAL    . torn meniscus in right knee surgery  Family History  Problem Relation Age of Onset  . Cancer Father      Social History   Tobacco Use  Smoking Status Former Smoker  . Types: Cigarettes  . Quit date: 04/11/1987  . Years since quitting: 32.6  Smokeless Tobacco Never Used    Social History   Substance and Sexual Activity  Alcohol Use No     Allergies  Allergen Reactions  . Amlodipine Anaphylaxis    Weakness and fatigue   . Tetanus Toxoids Anaphylaxis  . Crestor [Rosuvastatin]     Myalgia   . Penicillins Hives    Has patient had a PCN reaction causing immediate rash, facial/tongue/throat swelling, SOB or lightheadedness with hypotension: No Has patient had a PCN reaction causing severe rash involving mucus membranes or skin necrosis: Yes Has patient had a PCN reaction that required hospitalization: No Has patient had a PCN reaction occurring within the last 10 years: No If all of the above answers are "NO", then may proceed with Cephalosporin use.     Current Outpatient Medications  Medication Sig Dispense Refill  . aspirin EC 81 MG tablet Take  81 mg by mouth daily.    Marland Kitchen atorvastatin (LIPITOR) 80 MG tablet TAKE 1 TABLET BY MOUTH EVERY DAY 90 tablet 2  . CALCIUM PO Take 1,200 mg by mouth daily.    . carboxymethylcellulose (REFRESH PLUS) 0.5 % SOLN Place 1 drop into both eyes daily as needed (dry eyes).     . cetirizine (ZYRTEC) 10 MG tablet Take 10 mg by mouth daily.    Marland Kitchen CINNAMON PO Take 1,000 mg by mouth daily.     . clindamycin (CLEOCIN) 300 MG capsule Take 2 tablets (600 mg total) 30-60 minutes prior to dental or medical procedure. 8 capsule 1  . Multiple Vitamins-Minerals (MULTIVITAMIN WITH MINERALS) tablet Take 1 tablet by mouth daily.    . Omega-3 Krill Oil 500 MG CAPS Take 500 mg by mouth daily.     . ramipril (ALTACE) 10 MG capsule TAKE 1 CAPSULE BY MOUTH EVERY DAY 90 capsule 3  . warfarin (COUMADIN) 10 MG tablet TAKE 1 TO 1 AND 1/2 TABLETS DAILY AS DIRECTED BY COUMADIN CLINIC 135 tablet 1  . cholecalciferol (VITAMIN D) 1000 units tablet Take 1,000 Units by mouth daily.    . Tiotropium Bromide Monohydrate (SPIRIVA RESPIMAT) 2.5 MCG/ACT AERS Inhale 1 puff into the lungs daily. 4 g 5  . vitamin B-12 (CYANOCOBALAMIN) 1000 MCG tablet Take 1,000 mcg by mouth daily.     No current facility-administered medications for this visit.    Review of Systems  Constitutional: Negative.   Respiratory: Negative.   Cardiovascular: Negative.   Neurological: Negative.   Psychiatric/Behavioral: The patient is nervous/anxious.      PHYSICAL EXAMINATION: BP (!) 143/81 (BP Location: Right Arm, Patient Position: Sitting, Cuff Size: Normal)   Pulse (!) 48   Temp (!) 97.3 F (36.3 C)   Resp 16   Ht 5\' 11"  (1.803 m)   Wt 179 lb (81.2 kg)   SpO2 96% Comment: RA  BMI 24.97 kg/m  Physical Exam Constitutional:      General: He is not in acute distress.    Appearance: Normal appearance. He is not ill-appearing.  Eyes:     Extraocular Movements: Extraocular movements intact.     Conjunctiva/sclera: Conjunctivae normal.  Cardiovascular:       Rate and Rhythm: Normal rate and regular rhythm.  Pulmonary:     Effort: Pulmonary effort is normal. No  respiratory distress.  Musculoskeletal:        General: Normal range of motion.  Skin:    General: Skin is warm and dry.  Neurological:     General: No focal deficit present.     Mental Status: He is alert and oriented to person, place, and time.     Diagnostic Studies & Laboratory data:     Recent Radiology Findings:   CT ANGIO CHEST AORTA W/CM & OR WO/CM  Result Date: 11/06/2019 CLINICAL DATA:  Status post thoracic aortic aneurysm air, pulmonary nodules EXAM: CT ANGIOGRAPHY CHEST WITH CONTRAST TECHNIQUE: Multidetector CT imaging of the chest was performed using the standard protocol during bolus administration of intravenous contrast. Multiplanar CT image reconstructions and MIPs were obtained to evaluate the vascular anatomy. CONTRAST:  170mL OMNIPAQUE IOHEXOL 350 MG/ML SOLN COMPARISON:  04/30/2019, 11/17/2018 FINDINGS: Cardiovascular: Stable appearance status post ascending aortic aneurysm repair and aortic valve replacement. Similar slight angulation of the ascending aortic repair without evidence of acute vascular process by CTA. Atherosclerotic changes of the aorta noted. Patent 3 vessel arch anatomy. Previous coronary bypass changes noted. Normal heart size. No pericardial effusion. Native coronary atherosclerosis. No significant acute pulmonary embolus demonstrated. Central venous anatomy is all patent. 3.8 cm proximal aortic arch, stable 3.4 cm proximal descending thoracic aorta. 2.9 cm distal descending thoracic aorta. 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 Celiac and SMA appear to remain patent. Visualized renal arteries have calcified origins but appear patent. Mediastinum/Nodes: No enlarged mediastinal, hilar, or axillary lymph nodes. Thyroid gland, trachea, and esophagus demonstrate no significant findings.  Lungs/Pleura: Stable background emphysema pattern. Right upper lobe anteromedial mixed ground-glass/pleural based opacity has enlarged. Dominant area now measures 3.9 x 2.8 cm and abuts against the anterior mediastinum medially and the pleural surface anteriorly. There is also increase in size and number of adjacent subcentimeter ground-glass and subsolid nodules, images 36 through 49 series 5. Slow progression since the last exam is concerning for low-grade adenocarcinoma over a chronic infectious/inflammatory process. No pleural abnormality, effusion or pneumothorax. Upper Abdomen: Right upper pole renal cyst again noted. No other acute upper abdominal finding. Musculoskeletal: Degenerative changes noted of the spine. No acute compression fracture. Previous median sternotomy noted. Review of the MIP images confirms the above findings. IMPRESSION: Stable appearance of the aortic root graft and aortic valve replacement. No other acute intrathoracic vascular finding. Interval enlargement of the anteromedial right upper lobe mixed ground-glass/airspace opacity with increase in size and number of adjacent satellite sub solid and ground-glass nodules. Adenocarcinoma is considered. Thoracic surgery consultation is recommended. These recommendations are taken from: Recommendations for the Management of Subsolid Pulmonary Nodules Detected at CT: A Statement from the [facility] 2013; 266:1, [phone]. Aortic Atherosclerosis (ICD10-I70.0). These results will be called to the ordering clinician or representative by the Radiologist Assistant, and communication documented in the PACS or [employer]. Electronically Signed   By: Jerilynn Mages.  Shick M.D.   On: 11/06/2019 09:16       I have independently reviewed the above radiology studies  and reviewed the findings with the patient.   Recent Lab Findings: Lab Results  Component Value Date   WBC 4.8 11/11/2018   HGB 13.6 11/11/2018   HCT 41.3 11/11/2018    PLT 136 (L) 11/11/2018   GLUCOSE 85 10/28/2019   ALT 25 09/05/2012   AST 30 09/05/2012   NA 139 10/28/2019   K 5.1 10/28/2019   CL 104 10/28/2019  CREATININE 0.81 10/28/2019   BUN 10 10/28/2019   CO2 25 10/28/2019   TSH 0.846 11/17/2018   INR 3.6 (A) 11/17/2019   HGBA1C  05/22/2007    5.7 (NOTE)   The [name] recommends the following therapeutic goals for glycemic   control related to Hgb A1C measurement:   Goal of Therapy:   < 7.0% Hgb A1C   Action Suggested:  > 8.0% Hgb A1C   Ref:  Diabetes Care, 22, Suppl. 1, 1999     PFTs: March 2021 - FVC: 118% - FEV1: 120% -DLCO: 110%  Problem List: 3.9 cm right upper lobe pulmonary mass History of aortic valve replacement currently on Coumadin  Assessment / Plan:   This is a 74 year old male who presents with a 3.9 cm right upper lobe mass that shown interval growth over the last year. There is also significant groundglass opacity surrounding the nodule. The PET/CT from January of this year did showed mild uptake. The risk benefits and alternatives for surgical resection were discussed with him in detail and he is somewhat hesitant to proceed. We also covered several other options which included navigational versus CT-guided biopsy. Given the proximity to the aorta I think that a CT-guided biopsy would be a little bit risky. I will discuss the case again with Marcus [name] to see about options for navigational bronchoscopy if he does not want to proceed with direct resection. Surgical standpoint he does have good lung function, and would need to undergo a stress test for cardiac clearance. Outside of that he would be a good candidate for robotic assisted wedge resection followed by a lobectomy if this proves to be a primary lung cancer. I have ordered another PET scan given that the previous one is in for 6 months old. I will contact him with the results.     I  spent 55 minutes with  the patient face to face and greater then 50% of the time was spent  in counseling and coordination of care.    [name] 11/18/2019 5:46 PM

## 2019-11-24 ENCOUNTER — Other Ambulatory Visit: Payer: Self-pay

## 2019-11-24 ENCOUNTER — Ambulatory Visit (HOSPITAL_COMMUNITY)
Admission: RE | Admit: 2019-11-24 | Discharge: 2019-11-24 | Disposition: A | Discharge status: Home / Self Care | Payer: Medicare HMO | Source: Ambulatory Visit | Attending: Thoracic Surgery (Cardiothoracic Vascular Surgery) | Admitting: Thoracic Surgery (Cardiothoracic Vascular Surgery)

## 2019-11-24 DIAGNOSIS — R918 Other nonspecific abnormal finding of lung field: Secondary | ICD-10-CM | POA: Insufficient documentation

## 2019-11-24 DIAGNOSIS — I77811 Abdominal aortic ectasia: Secondary | ICD-10-CM | POA: Diagnosis not present

## 2019-11-24 DIAGNOSIS — I700 Atherosclerosis of aorta: Secondary | ICD-10-CM | POA: Insufficient documentation

## 2019-11-24 DIAGNOSIS — J439 Emphysema, unspecified: Secondary | ICD-10-CM | POA: Insufficient documentation

## 2019-11-24 DIAGNOSIS — J432 Centrilobular emphysema: Secondary | ICD-10-CM | POA: Diagnosis not present

## 2019-11-24 DIAGNOSIS — I7789 Other specified disorders of arteries and arterioles: Secondary | ICD-10-CM | POA: Diagnosis not present

## 2019-11-24 LAB — GLUCOSE, CAPILLARY: Glucose-Capillary: 87 mg/dL (ref 70–99)

## 2019-11-24 MED ORDER — FLUDEOXYGLUCOSE F - 18 (FDG) INJECTION
9.0000 | Freq: Once | INTRAVENOUS | Status: AC | PRN
  Administered 2019-11-24: 9 via INTRAVENOUS

## 2019-12-01 ENCOUNTER — Other Ambulatory Visit: Payer: Self-pay | Admitting: Thoracic Surgery (Cardiothoracic Vascular Surgery)

## 2019-12-01 ENCOUNTER — Other Ambulatory Visit: Payer: Self-pay

## 2019-12-01 ENCOUNTER — Other Ambulatory Visit: Payer: Self-pay | Admitting: *Deleted

## 2019-12-01 ENCOUNTER — Telehealth (HOSPITAL_COMMUNITY): Payer: Self-pay | Admitting: *Deleted

## 2019-12-01 DIAGNOSIS — G4733 Obstructive sleep apnea (adult) (pediatric): Secondary | ICD-10-CM | POA: Diagnosis not present

## 2019-12-01 DIAGNOSIS — R918 Other nonspecific abnormal finding of lung field: Secondary | ICD-10-CM

## 2019-12-01 LAB — POCT INR: INR: 5.1 — AB (ref 2.0–3.0)

## 2019-12-01 MED ORDER — ENOXAPARIN SODIUM 80 MG/0.8ML ~~LOC~~ SOLN: 80.0000 mg | Freq: Two times a day (BID) | SUBCUTANEOUS | 0 refills | Status: DC

## 2019-12-01 NOTE — Progress Notes (Signed)
Per Dr. Kipp Brood, patient is to start Lovenox bridge, twice daily, 5 days prior to surgery 12/08/19.  Patient called and confirmed [facility] [place], [place].  Patient also advised how to take medication to call the office if further instructions needed.  Patient acknowledged receipt. Script sent electronically.

## 2019-12-01 NOTE — Telephone Encounter (Signed)
Patient given detailed instructions per Myocardial Perfusion Study Information Sheet for the test on 12/02/19 at 7:30. Patient notified to arrive 15 minutes early and that it is imperative to arrive on time for appointment to keep from having the test rescheduled.  If you need to cancel or reschedule your appointment, please call the office within 24 hours of your appointment. . Patient verbalized understanding.Marcus [name]

## 2019-12-02 ENCOUNTER — Other Ambulatory Visit: Payer: Self-pay

## 2019-12-02 ENCOUNTER — Ambulatory Visit (INDEPENDENT_AMBULATORY_CARE_PROVIDER_SITE_OTHER): Payer: Medicare HMO | Admitting: Pharmacist

## 2019-12-02 ENCOUNTER — Ambulatory Visit (HOSPITAL_COMMUNITY): Payer: Medicare HMO | Attending: Cardiovascular Disease

## 2019-12-02 VITALS — Ht 71.0 in | Wt 179.0 lb

## 2019-12-02 DIAGNOSIS — Z7901 Long term (current) use of anticoagulants: Secondary | ICD-10-CM | POA: Diagnosis not present

## 2019-12-02 DIAGNOSIS — Z952 Presence of prosthetic heart valve: Secondary | ICD-10-CM | POA: Diagnosis not present

## 2019-12-02 DIAGNOSIS — R918 Other nonspecific abnormal finding of lung field: Secondary | ICD-10-CM

## 2019-12-02 DIAGNOSIS — I2510 Atherosclerotic heart disease of native coronary artery without angina pectoris: Secondary | ICD-10-CM | POA: Insufficient documentation

## 2019-12-02 LAB — MYOCARDIAL PERFUSION IMAGING
LV dias vol: 108 mL (ref 62–150)
LV sys vol: 40 mL
Peak HR: 76 {beats}/min
Rest HR: 52 {beats}/min
SDS: 1
SRS: 2
SSS: 3
TID: 1.18

## 2019-12-02 MED ORDER — TECHNETIUM TC 99M TETROFOSMIN IV KIT
10.2000 | PACK | Freq: Once | INTRAVENOUS | Status: AC | PRN
  Administered 2019-12-02: 10.2 via INTRAVENOUS
  Filled 2019-12-02: qty 11

## 2019-12-02 MED ORDER — TECHNETIUM TC 99M TETROFOSMIN IV KIT
31.1000 | PACK | Freq: Once | INTRAVENOUS | Status: AC | PRN
  Administered 2019-12-02: 31.1 via INTRAVENOUS
  Filled 2019-12-02: qty 32

## 2019-12-02 MED ORDER — REGADENOSON 0.4 MG/5ML IV SOLN
0.4000 mg | Freq: Once | INTRAVENOUS | Status: AC
  Administered 2019-12-02: 0.4 mg via INTRAVENOUS

## 2019-12-03 ENCOUNTER — Ambulatory Visit (INDEPENDENT_AMBULATORY_CARE_PROVIDER_SITE_OTHER): Payer: Medicare HMO | Admitting: Pharmacist

## 2019-12-03 LAB — POCT INR: INR: 1.8 — AB (ref 2.0–3.0)

## 2019-12-04 ENCOUNTER — Other Ambulatory Visit (HOSPITAL_COMMUNITY)
Admission: RE | Admit: 2019-12-04 | Discharge: 2019-12-04 | Disposition: A | Discharge status: Home / Self Care | Payer: Medicare HMO | Source: Ambulatory Visit | Attending: Thoracic Surgery (Cardiothoracic Vascular Surgery) | Admitting: Thoracic Surgery (Cardiothoracic Vascular Surgery)

## 2019-12-04 ENCOUNTER — Other Ambulatory Visit: Payer: Self-pay

## 2019-12-04 ENCOUNTER — Encounter (HOSPITAL_COMMUNITY): Payer: Self-pay

## 2019-12-04 ENCOUNTER — Encounter (HOSPITAL_COMMUNITY)
Admission: RE | Admit: 2019-12-04 | Discharge: 2019-12-04 | Disposition: A | Discharge status: Home / Self Care | Payer: Medicare HMO | Source: Ambulatory Visit | Attending: Thoracic Surgery (Cardiothoracic Vascular Surgery) | Admitting: Thoracic Surgery (Cardiothoracic Vascular Surgery)

## 2019-12-04 DIAGNOSIS — Z20822 Contact with and (suspected) exposure to covid-19: Secondary | ICD-10-CM | POA: Insufficient documentation

## 2019-12-04 DIAGNOSIS — Z01812 Encounter for preprocedural laboratory examination: Secondary | ICD-10-CM | POA: Insufficient documentation

## 2019-12-04 DIAGNOSIS — R918 Other nonspecific abnormal finding of lung field: Secondary | ICD-10-CM

## 2019-12-04 DIAGNOSIS — Z01818 Encounter for other preprocedural examination: Secondary | ICD-10-CM | POA: Diagnosis not present

## 2019-12-04 HISTORY — DX: Cardiac arrhythmia, unspecified: I49.9

## 2019-12-04 LAB — TYPE AND SCREEN
ABO/RH(D): O POS
Antibody Screen: NEGATIVE

## 2019-12-04 LAB — URINALYSIS, ROUTINE W REFLEX MICROSCOPIC
Bilirubin Urine: NEGATIVE
Glucose, UA: NEGATIVE mg/dL
Hgb urine dipstick: NEGATIVE
Ketones, ur: NEGATIVE mg/dL
Leukocytes,Ua: NEGATIVE
Nitrite: NEGATIVE
Protein, ur: NEGATIVE mg/dL
Specific Gravity, Urine: 1.009 (ref 1.005–1.030)
pH: 7 (ref 5.0–8.0)

## 2019-12-04 LAB — COMPREHENSIVE METABOLIC PANEL
ALT: 24 U/L (ref 0–44)
AST: 32 U/L (ref 15–41)
Albumin: 4 g/dL (ref 3.5–5.0)
Alkaline Phosphatase: 50 U/L (ref 38–126)
Anion gap: 8 (ref 5–15)
BUN: 9 mg/dL (ref 8–23)
CO2: 23 mmol/L (ref 22–32)
Calcium: 8.8 mg/dL — ABNORMAL LOW (ref 8.9–10.3)
Chloride: 109 mmol/L (ref 98–111)
Creatinine, Ser: 0.72 mg/dL (ref 0.61–1.24)
GFR calc Af Amer: >60 mL/min (ref >60)
GFR calc non Af Amer: >60 mL/min (ref >60)
Glucose, Bld: 93 mg/dL (ref 70–99)
Potassium: 3.9 mmol/L (ref 3.5–5.1)
Sodium: 140 mmol/L (ref 135–145)
Total Bilirubin: 2.8 mg/dL — ABNORMAL HIGH (ref 0.3–1.2)
Total Protein: 6.6 g/dL (ref 6.5–8.1)

## 2019-12-04 LAB — CBC
HCT: 41.4 % (ref 39.0–52.0)
Hemoglobin: 13.7 g/dL (ref 13.0–17.0)
MCH: 32.3 pg (ref 26.0–34.0)
MCHC: 33.1 g/dL (ref 30.0–36.0)
MCV: 97.6 fL (ref 80.0–100.0)
Platelets: 114 10*3/uL — ABNORMAL LOW (ref 150–400)
RBC: 4.24 MIL/uL (ref 4.22–5.81)
RDW: 13.4 % (ref 11.5–15.5)
WBC: 4.9 10*3/uL (ref 4.0–10.5)
nRBC: 0 % (ref 0.0–0.2)

## 2019-12-04 LAB — BLOOD GAS, ARTERIAL
Acid-Base Excess: 1.1 mmol/L (ref 0.0–2.0)
Bicarbonate: 24.6 mmol/L (ref 20.0–28.0)
Drawn by: 58793
FIO2: 21
O2 Saturation: 97.8 %
Patient temperature: 37
pCO2 arterial: 35.8 mmHg (ref 32.0–48.0)
pH, Arterial: 7.452 — ABNORMAL HIGH (ref 7.350–7.450)
pO2, Arterial: 98.3 mmHg (ref 83.0–108.0)

## 2019-12-04 LAB — SURGICAL PCR SCREEN
MRSA, PCR: NEGATIVE
Staphylococcus aureus: NEGATIVE

## 2019-12-04 LAB — SARS CORONAVIRUS 2 (TAT 6-24 HRS): SARS Coronavirus 2: NEGATIVE

## 2019-12-04 LAB — APTT: aPTT: 49 seconds — ABNORMAL HIGH (ref 24–36)

## 2019-12-04 LAB — PROTIME-INR
INR: 1.2 (ref 0.8–1.2)
Prothrombin Time: 14.5 seconds (ref 11.4–15.2)

## 2019-12-04 NOTE — Pre-Procedure Instructions (Signed)
Marcus [name]  12/04/2019      [id number] [address], [address] [place] [address] Phone: [phone] Fax: [phone]    Your procedure is scheduled on 12/08/19.  Report to [facility] Admitting at 530 A.M.  Call this number if you have problems the morning of surgery:  [phone]   Remember:  Do not eat or drink after midnight.  Y Take these medicines the morning of surgery with A SIP OF WATER ----all inhalers    Do not wear jewelry, make-up or nail polish.  Do not wear lotions, powders, or perfumes, or deodorant.  Do not shave 48 hours prior to surgery.  Men may shave face and neck.  Do not bring valuables to the hospital.  [facility] is not responsible for any belongings or valuables.  Contacts, dentures or bridgework may not be worn into surgery.  Leave your suitcase in the car.  After surgery it may be brought to your room.  For patients admitted to the hospital, discharge time will be determined by your treatment team.  Patients discharged the day of surgery will not be allowed to drive home.   i       All instructions explained to the patient, with a verbal understanding of the material. Patient agrees to go over the instructions while at home for a better understanding. Patient also instructed to self quarantine after being tested for COVID-19. The opportunity to ask questions was provided.  [facility] - Preparing for Surgery  Before surgery, you can play an important role.  Because skin is not sterile, your skin needs to be as free of germs as possible.  You can reduce the number of germs on you skin by washing with CHG (chlorahexidine gluconate) soap before surgery.  CHG is an antiseptic cleaner which kills germs and bonds with the skin to continue killing germs even after washing.  Oral Hygiene is also important in reducing the risk of infection.  Remember to brush your teeth with your regular toothpaste the  morning of surgery.  Please DO NOT use if you have an allergy to CHG or antibacterial soaps.  If your skin becomes reddened/irritated stop using the CHG and inform your nurse when you arrive at Short Stay.  Do not shave (including legs and underarms) for at least 48 hours prior to the first CHG shower.  You may shave your face.  Please follow these instructions carefully:   1.  Shower with CHG Soap the night before surgery and the morning of Surgery.  2.  If you choose to wash your hair, wash your hair first as usual with your normal shampoo.  3.  After you shampoo, rinse your hair and body thoroughly to remove the shampoo. 4.  Use CHG as you would any other liquid soap.  You can apply chg directly to the skin and wash gently with a      scrungie or washcloth.           5.  Apply the CHG Soap to your body ONLY FROM THE NECK DOWN.   Do not use on open wounds or open sores. Avoid contact with your eyes, ears, mouth and genitals (private parts).  Wash genitals (private parts) with your normal soap.  6.  Wash thoroughly, paying special attention to the area where your surgery will be performed.  7.  Thoroughly rinse your body with warm water from the neck down.  8.  DO NOT shower/wash with your normal soap after using and rinsing off the CHG Soap.  9.  Pat yourself dry with a clean towel.            10.  Wear clean pajamas.            11.  Place clean sheets on your bed the night of your first shower and do not sleep with pets.  Day of Surgery  Do not apply any lotions/deoderants the morning of surgery.   Please wear clean clothes to the hospital/surgery center. Remember to brush your teeth with toothpaste.   Please read over the following fact sheets that you were given. Coughing and Deep Breathing

## 2019-12-04 NOTE — Progress Notes (Signed)
PCP - DR Leana Roe [facility] Cardiologist - DR [name]  Chest x-ray - TO DO DOS EKG - TODAY Stress Test - 8/21 ECHO - 8/20 Cardiac Cath - 2009    Blood Thinner Instructions:COUMADIN STOPPED LAST WEEK.  LOVENOX STARTED AS INSTRUCTED BY MD Aspirin Instructions:STOP     COVID TEST- DONE TODAY   Anesthesia review: HEART HX  Patient denies shortness of breath, fever, cough and chest pain at PAT appointment   All instructions explained to the patient, with a verbal understanding of the material. Patient agrees to go over the instructions while at home for a better understanding. Patient also instructed to self quarantine after being tested for COVID-19. The opportunity to ask questions was provided.

## 2019-12-05 ENCOUNTER — Ambulatory Visit: Payer: Medicare HMO | Admitting: Thoracic Surgery (Cardiothoracic Vascular Surgery)

## 2019-12-05 NOTE — Progress Notes (Signed)
Anesthesia Chart Review:  Case: [id number] Date/Time: 12/08/19 0715   Procedures:      XI ROBOTIC ASSISTED THORASCOPY-RIGHT UPPER LOBE WEDGE RESECTION (Right Chest)     possible XI ROBOTIC ASSISTED THORASCOPY-LOBECTOMY (Right Chest)   Anesthesia type: General   Pre-op diagnosis: RUL LUNG MASS   Location: [facility] OR ROOM 10 / [facility]   Surgeons: [name], MD      DISCUSSION: Patient is a 74 year old male scheduled for the above procedure.   History includes former smoker (quit 04/11/87), HTN, CAD with ascending TAA with severe AI (s/p CABG/Bentall procedure 05/22/07: SVG-OM, SVG-RCA, Bentall 25 mm St. Jude AVR), afib (s/p DCCV 08/02/07), exertional dyspnea, OSA (CPAP).   Dr. Kipp Brood classified his Zubrod score as 0 (normal activity, no symptoms). He did order a preoperative stress test which was normal on 12/02/19. Stable echo in 11/2018.  Patient reported warfarin on hold and currently on Lovenox bridge.   Preoperative labs reviewed. H/H 13.7/41.1. INR 1.2, PTT 49. PLT 114K (previously 124K 09/05/12, 136K 11/11/18). Total bilirubin 2.8 (with normal AST/ALT). Currently no recent comparison LFTs. Results communicated with [facility] RN Thurmond Butts who will have Dr. Kipp Brood review. Will defer additional preoperative labs orders, if any, to Dr. Kipp Brood.    He is for CXR on the day of surgery. 12/04/19 presurgical COVID-19 test negative. Anesthesia team to evaluate on the day of surgery.    VS: BP (!) 153/81   Pulse (!) 58   Temp 36.8 C (Oral)   Resp 20   Ht 5\' 11"  (1.803 m)   Wt 82.2 kg   SpO2 98%   BMI 25.29 kg/m     PROVIDERS: Norberto Sorenson, MD is listed as PCP  [name], [name], MD is cardiologist Shelva Majestic, MD is Sleep Medicine cardiologist Baltazar Apo, MD is pulmonologist   LABS: Labs reviewed: Acceptable for surgery.  (all labs ordered are listed, but only abnormal results are displayed)  Labs Reviewed  APTT - Abnormal; Notable for the following components:      Result  Value   aPTT 49 (*)    All other components within normal limits  BLOOD GAS, ARTERIAL - Abnormal; Notable for the following components:   pH, Arterial 7.452 (*)    Allens test (pass/fail) BRACHIAL ARTERY (*)    All other components within normal limits  CBC - Abnormal; Notable for the following components:   Platelets 114 (*)    All other components within normal limits  COMPREHENSIVE METABOLIC PANEL - Abnormal; Notable for the following components:   Calcium 8.8 (*)    Total Bilirubin 2.8 (*)    All other components within normal limits  SURGICAL PCR SCREEN  PROTIME-INR  URINALYSIS, ROUTINE W REFLEX MICROSCOPIC  TYPE AND SCREEN    PFTs 06/10/19: FVC 5.06 (118%), post 5.01 (117%). FEV1 3.73 (120%), post 3.79 (121%). DLCO und 27.91 (110%).    IMAGES: PET Scan 11/24/19: IMPRESSION: 1. Part solid lesion within the anteromedial aspect of the right upper has increased in size and degree of FDG uptake compared with 04/30/2019. Imaging findings are suspicious for underlying neoplasm. Adenocarcinoma is considered. 2. Surrounding the FDG avid right upper lobe lung lesion are multiple, progressive non solid, part solid and solid nodules which are suspicious and may represent satellite lesions. 3. No FDG avid nodal metastasis or distant metastatic disease. 4. Aortic Atherosclerosis (ICD10-I70.0) and Emphysema (ICD10-J43.9).   CTA Chest 11/06/19: IMPRESSION: - Stable appearance of the aortic root graft and aortic valve replacement. No  other acute intrathoracic vascular finding. - Interval enlargement of the anteromedial right upper lobe mixed ground-glass/airspace opacity with increase in size and number of adjacent satellite sub solid and ground-glass nodules. - Adenocarcinoma is considered. Thoracic surgery consultation is recommended. - These recommendations are taken from: - Recommendations for the Management of Subsolid Pulmonary Nodules Detected at CT: A Statement from the  [facility] 2013; 266:1, [phone]. - Aortic Atherosclerosis (ICD10-I70.0).   EKG: 12/04/19: Sinus bradycardia at 51 bpm Non-specific intra-ventricular conduction block Abnormal ECG Sinus bradycardia , new Premature atrial complexes NO LONGER PRESENT Confirmed by Shelva Majestic [phone]) on 12/04/2019 6:35:56 PM   CV: Nuclear stress test 12/02/19:  Nuclear stress EF: 63%.  There was no ST segment deviation noted during stress.  No T wave inversion was noted during stress.  The study is normal.  This is a low risk study. Low risk stress nuclear study with normal perfusion and normal left ventricular regional and global systolic function.   Cardiac event monitor 11/18/18-12/01/18:  Dominant rhythm is normal sinus with typical normal circadian variation. At times there is fairly significant nocturnal bradycardia and there is tachycardia with activity.  There is no evidence of atrial fibrillation or of any meaningful ventricular arrhythmia.  No pauses are seen.  There are occasional premature atrial complexes, many times in a pattern of bigeminy.  Mildly abnormal event monitor with periods of moderate sinus bradycardia at night and occasional atrial bigeminy.   Echo 11/25/18: IMPRESSIONS  1. The left ventricle has normal systolic function with an ejection  fraction of 60-65%. The cavity size was normal. Mild basal septal  hypertrophy. Left ventricular diastolic parameters were normal.  2. The right ventricle has normal systolic function. The cavity was  severely enlarged. There is no increase in right ventricular wall  thickness. Right ventricular systolic pressure is normal with an estimated  pressure of 20.9 mmHg.  3. The interatrial septum appears to be lipomatous.  4. A 52mm St. Jude valve is present in the aortic position. Normal aortic  valve prosthesis. AV Mean Grad: 8.4 mmHg. There is trivial perivalvular  AI.  5. Compared to prior echo, no  significant change    Past Medical History:  Diagnosis Date  . Atrial fibrillation ([place])   . Dyspnea    with exertion   . Dysrhythmia   . Hypertension   . Obstructive sleep apnea 10/25/2007   cpap  . S/P AVR    2D ECHO, 04/25/2011 - EF >55%, Right ventricle-mild-moderately dilated  . Swelling of limb    LEA VENOUS, 08/22/2009 - no evidence of deep vein or superficial thrombosis; partially rupturing Baker's Cyst    Past Surgical History:  Procedure Laterality Date  . CARDIAC CATHETERIZATION Bilateral 05/10/2007   Significant 1-vessel disease, severely dilated aortic root with moderate severe aortic insufficiency  . CARDIAC SURGERY    . CARDIOVERSION  08/02/2007   150 Joule biphasic shock with restoration of sinus rhythm. Heart rate 60.  . cataract surgery     . COLONOSCOPY WITH PROPOFOL N/A 12/15/2016   Procedure: COLONOSCOPY WITH PROPOFOL;  Surgeon: Carol Ada, MD;  Location: [facility];  Service: Endoscopy;  Laterality: N/A;  . EYE SURGERY    . NM MYOVIEW LTD  04/08/2007   No evidence of inducible myocardial ischemia  . THYROIDECTOMY, PARTIAL    . torn meniscus in right knee surgery       MEDICATIONS: . testosterone (ANDROGEL) 50 MG/5GM (1%) GEL  . aspirin EC 81 MG tablet  .  atorvastatin (LIPITOR) 80 MG tablet  . Calcium Carb-Cholecalciferol (CALCIUM 600+D3 PO)  . carboxymethylcellulose (REFRESH PLUS) 0.5 % SOLN  . CINNAMON PO  . clindamycin (CLEOCIN) 300 MG capsule  . Coenzyme Q10 (COQ10 PO)  . enoxaparin (LOVENOX) 80 MG/0.8ML injection  . fexofenadine (ALLEGRA) 180 MG tablet  . Krill Oil 350 MG CAPS  . Multiple Vitamins-Minerals (EYE SUPPORT PO)  . NONFORMULARY OR COMPOUNDED ITEM  . ramipril (ALTACE) 10 MG capsule  . Tiotropium Bromide Monohydrate (SPIRIVA RESPIMAT) 2.5 MCG/ACT AERS  . warfarin (COUMADIN) 10 MG tablet   No current facility-administered medications for this encounter.  By medication list, not currently taking Spiriva.    Myra Gianotti,  PA-C Surgical Short Stay/Anesthesiology [facility] Phone [phone] [facility] Phone [phone] 12/05/2019 11:40 AM

## 2019-12-05 NOTE — Anesthesia Preprocedure Evaluation (Addendum)
Anesthesia Evaluation  Patient identified by MRN, date of birth, ID band Patient awake    Reviewed: Allergy & Precautions, NPO status , Patient's Chart, lab work & pertinent test results  Airway Mallampati: II  TM Distance: >3 FB Neck ROM: Full    Dental no notable dental hx.    Pulmonary sleep apnea and Continuous Positive Airway Pressure Ventilation , COPD, former smoker,    Pulmonary exam normal breath sounds clear to auscultation       Cardiovascular hypertension,  Rhythm:Regular Rate:Normal + Systolic murmurs S/P AVR EF 55%   Neuro/Psych negative neurological ROS  negative psych ROS   GI/Hepatic negative GI ROS, Neg liver ROS,   Endo/Other  negative endocrine ROS  Renal/GU negative Renal ROS  negative genitourinary   Musculoskeletal negative musculoskeletal ROS (+)   Abdominal   Peds negative pediatric ROS (+)  Hematology negative hematology ROS (+)   Anesthesia Other Findings   Reproductive/Obstetrics negative OB ROS                            Anesthesia Physical Anesthesia Plan  ASA: III  Anesthesia Plan: General   Post-op Pain Management:    Induction: Intravenous  PONV Risk Score and Plan: 2 and Ondansetron and Dexamethasone  Airway Management Planned: Double Lumen EBT  Additional Equipment: Arterial line  Intra-op Plan:   Post-operative Plan: Extubation in OR  Informed Consent: I have reviewed the patients History and Physical, chart, labs and discussed the procedure including the risks, benefits and alternatives for the proposed anesthesia with the patient or authorized representative who has indicated his/her understanding and acceptance.     Dental advisory given  Plan Discussed with: CRNA and Surgeon  Anesthesia Plan Comments: (PAT note written 12/05/2019 by Myra Gianotti, PA-C. )       Anesthesia Quick Evaluation

## 2019-12-08 ENCOUNTER — Inpatient Hospital Stay (HOSPITAL_COMMUNITY): Payer: Medicare HMO

## 2019-12-08 ENCOUNTER — Other Ambulatory Visit: Payer: Self-pay

## 2019-12-08 ENCOUNTER — Encounter (HOSPITAL_COMMUNITY): Payer: Self-pay | Admitting: Thoracic Surgery (Cardiothoracic Vascular Surgery)

## 2019-12-08 ENCOUNTER — Inpatient Hospital Stay (HOSPITAL_COMMUNITY): Payer: Medicare HMO | Admitting: Vascular Surgery

## 2019-12-08 ENCOUNTER — Encounter (HOSPITAL_COMMUNITY)
Admission: RE | Disposition: A | Discharge status: Home / Self Care | Payer: Self-pay | Source: Home / Self Care | Attending: Thoracic Surgery (Cardiothoracic Vascular Surgery)

## 2019-12-08 ENCOUNTER — Inpatient Hospital Stay (HOSPITAL_COMMUNITY)
Admission: RE | Admit: 2019-12-08 | Discharge: 2019-12-18 | DRG: 164 | Disposition: A | Discharge status: Home / Self Care | Payer: Medicare HMO | Attending: Thoracic Surgery (Cardiothoracic Vascular Surgery) | Admitting: Thoracic Surgery (Cardiothoracic Vascular Surgery)

## 2019-12-08 ENCOUNTER — Inpatient Hospital Stay (HOSPITAL_COMMUNITY): Payer: Medicare HMO | Admitting: Certified Registered Nurse Anesthetist

## 2019-12-08 DIAGNOSIS — R197 Diarrhea, unspecified: Secondary | ICD-10-CM | POA: Diagnosis not present

## 2019-12-08 DIAGNOSIS — C771 Secondary and unspecified malignant neoplasm of intrathoracic lymph nodes: Secondary | ICD-10-CM | POA: Diagnosis not present

## 2019-12-08 DIAGNOSIS — Z20822 Contact with and (suspected) exposure to covid-19: Secondary | ICD-10-CM | POA: Diagnosis present

## 2019-12-08 DIAGNOSIS — Z7901 Long term (current) use of anticoagulants: Secondary | ICD-10-CM

## 2019-12-08 DIAGNOSIS — Z7982 Long term (current) use of aspirin: Secondary | ICD-10-CM

## 2019-12-08 DIAGNOSIS — R911 Solitary pulmonary nodule: Secondary | ICD-10-CM | POA: Diagnosis present

## 2019-12-08 DIAGNOSIS — J9382 Other air leak: Secondary | ICD-10-CM | POA: Diagnosis not present

## 2019-12-08 DIAGNOSIS — R918 Other nonspecific abnormal finding of lung field: Secondary | ICD-10-CM

## 2019-12-08 DIAGNOSIS — Z79899 Other long term (current) drug therapy: Secondary | ICD-10-CM

## 2019-12-08 DIAGNOSIS — D6959 Other secondary thrombocytopenia: Secondary | ICD-10-CM | POA: Diagnosis not present

## 2019-12-08 DIAGNOSIS — E785 Hyperlipidemia, unspecified: Secondary | ICD-10-CM | POA: Diagnosis present

## 2019-12-08 DIAGNOSIS — Z7951 Long term (current) use of inhaled steroids: Secondary | ICD-10-CM | POA: Diagnosis not present

## 2019-12-08 DIAGNOSIS — Z952 Presence of prosthetic heart valve: Secondary | ICD-10-CM

## 2019-12-08 DIAGNOSIS — Z09 Encounter for follow-up examination after completed treatment for conditions other than malignant neoplasm: Secondary | ICD-10-CM

## 2019-12-08 DIAGNOSIS — J9383 Other pneumothorax: Secondary | ICD-10-CM | POA: Diagnosis not present

## 2019-12-08 DIAGNOSIS — I480 Paroxysmal atrial fibrillation: Secondary | ICD-10-CM | POA: Diagnosis present

## 2019-12-08 DIAGNOSIS — Z4682 Encounter for fitting and adjustment of non-vascular catheter: Secondary | ICD-10-CM | POA: Diagnosis not present

## 2019-12-08 DIAGNOSIS — I4892 Unspecified atrial flutter: Secondary | ICD-10-CM | POA: Diagnosis present

## 2019-12-08 DIAGNOSIS — C3411 Malignant neoplasm of upper lobe, right bronchus or lung: Secondary | ICD-10-CM | POA: Diagnosis not present

## 2019-12-08 DIAGNOSIS — Z87891 Personal history of nicotine dependence: Secondary | ICD-10-CM

## 2019-12-08 DIAGNOSIS — D62 Acute posthemorrhagic anemia: Secondary | ICD-10-CM | POA: Diagnosis not present

## 2019-12-08 DIAGNOSIS — Z01818 Encounter for other preprocedural examination: Secondary | ICD-10-CM | POA: Diagnosis not present

## 2019-12-08 DIAGNOSIS — J449 Chronic obstructive pulmonary disease, unspecified: Secondary | ICD-10-CM | POA: Diagnosis not present

## 2019-12-08 DIAGNOSIS — J90 Pleural effusion, not elsewhere classified: Secondary | ICD-10-CM | POA: Diagnosis not present

## 2019-12-08 DIAGNOSIS — I10 Essential (primary) hypertension: Secondary | ICD-10-CM | POA: Diagnosis present

## 2019-12-08 DIAGNOSIS — J439 Emphysema, unspecified: Secondary | ICD-10-CM | POA: Diagnosis not present

## 2019-12-08 DIAGNOSIS — J939 Pneumothorax, unspecified: Secondary | ICD-10-CM | POA: Diagnosis not present

## 2019-12-08 DIAGNOSIS — I2510 Atherosclerotic heart disease of native coronary artery without angina pectoris: Secondary | ICD-10-CM | POA: Diagnosis not present

## 2019-12-08 DIAGNOSIS — J9811 Atelectasis: Secondary | ICD-10-CM | POA: Diagnosis not present

## 2019-12-08 DIAGNOSIS — G9389 Other specified disorders of brain: Secondary | ICD-10-CM | POA: Diagnosis not present

## 2019-12-08 DIAGNOSIS — Z9689 Presence of other specified functional implants: Secondary | ICD-10-CM

## 2019-12-08 HISTORY — PX: INTERCOSTAL NERVE BLOCK: SHX5021

## 2019-12-08 HISTORY — PX: NODE DISSECTION: SHX5269

## 2019-12-08 SURGERY — WEDGE RESECTION, LUNG, ROBOT-ASSISTED, THORACOSCOPIC
Anesthesia: General | Site: Chest | Laterality: Right

## 2019-12-08 MED ORDER — EPHEDRINE 5 MG/ML INJ
INTRAVENOUS | Status: AC
  Filled 2019-12-08: qty 10

## 2019-12-08 MED ORDER — VANCOMYCIN HCL IN DEXTROSE 1-5 GM/200ML-% IV SOLN
1000.0000 mg | Freq: Two times a day (BID) | INTRAVENOUS | Status: AC
  Administered 2019-12-08: 1000 mg via INTRAVENOUS
  Filled 2019-12-08: qty 200

## 2019-12-08 MED ORDER — WARFARIN SODIUM 5 MG PO TABS
5.0000 mg | ORAL_TABLET | ORAL | Status: AC
  Filled 2019-12-08: qty 1

## 2019-12-08 MED ORDER — KETOROLAC TROMETHAMINE 30 MG/ML IJ SOLN
INTRAMUSCULAR | Status: AC
  Filled 2019-12-08: qty 1

## 2019-12-08 MED ORDER — SODIUM CHLORIDE FLUSH 0.9 % IV SOLN
INTRAVENOUS | Status: DC | PRN
  Administered 2019-12-08: 100 mL

## 2019-12-08 MED ORDER — SUGAMMADEX SODIUM 200 MG/2ML IV SOLN
INTRAVENOUS | Status: DC | PRN
  Administered 2019-12-08: 200 mg via INTRAVENOUS

## 2019-12-08 MED ORDER — WARFARIN - PHYSICIAN DOSING INPATIENT
Freq: Every day | Status: DC
  Administered 2019-12-15: 1

## 2019-12-08 MED ORDER — ROCURONIUM BROMIDE 100 MG/10ML IV SOLN
INTRAVENOUS | Status: DC | PRN
  Administered 2019-12-08: 10 mg via INTRAVENOUS
  Administered 2019-12-08: 20 mg via INTRAVENOUS
  Administered 2019-12-08: 10 mg via INTRAVENOUS
  Administered 2019-12-08: 80 mg via INTRAVENOUS
  Administered 2019-12-08 (×2): 20 mg via INTRAVENOUS

## 2019-12-08 MED ORDER — PROPOFOL 10 MG/ML IV BOLUS
INTRAVENOUS | Status: AC
  Filled 2019-12-08: qty 20

## 2019-12-08 MED ORDER — SENNOSIDES-DOCUSATE SODIUM 8.6-50 MG PO TABS
1.0000 | ORAL_TABLET | Freq: Every day | ORAL | Status: DC
  Administered 2019-12-08 – 2019-12-16 (×4): 1 via ORAL
  Filled 2019-12-08 (×6): qty 1

## 2019-12-08 MED ORDER — MIDAZOLAM HCL 2 MG/2ML IJ SOLN
INTRAMUSCULAR | Status: AC
  Filled 2019-12-08: qty 2

## 2019-12-08 MED ORDER — ATORVASTATIN CALCIUM 80 MG PO TABS
80.0000 mg | ORAL_TABLET | Freq: Every evening | ORAL | Status: DC
  Administered 2019-12-08 – 2019-12-17 (×10): 80 mg via ORAL
  Filled 2019-12-08 (×10): qty 1

## 2019-12-08 MED ORDER — ROCURONIUM BROMIDE 10 MG/ML (PF) SYRINGE
PREFILLED_SYRINGE | INTRAVENOUS | Status: AC
  Filled 2019-12-08: qty 10

## 2019-12-08 MED ORDER — SODIUM CHLORIDE 0.9 % IV SOLN: INTRAVENOUS | Status: DC | PRN

## 2019-12-08 MED ORDER — LACTATED RINGERS IV SOLN: INTRAVENOUS | Status: DC

## 2019-12-08 MED ORDER — OXYCODONE HCL 5 MG/5ML PO SOLN: 5.0000 mg | Freq: Once | ORAL | Status: DC | PRN

## 2019-12-08 MED ORDER — LUNG SURGERY BOOK
Freq: Once | Status: AC
  Filled 2019-12-08: qty 1

## 2019-12-08 MED ORDER — FENTANYL CITRATE (PF) 250 MCG/5ML IJ SOLN
INTRAMUSCULAR | Status: AC
  Filled 2019-12-08: qty 5

## 2019-12-08 MED ORDER — ENOXAPARIN SODIUM 40 MG/0.4ML ~~LOC~~ SOLN: 40.0000 mg | Freq: Every day | SUBCUTANEOUS | Status: DC

## 2019-12-08 MED ORDER — ENOXAPARIN SODIUM 80 MG/0.8ML ~~LOC~~ SOLN: 1.0000 mg/kg | Freq: Two times a day (BID) | SUBCUTANEOUS | Status: DC

## 2019-12-08 MED ORDER — PHENYLEPHRINE HCL (PRESSORS) 10 MG/ML IV SOLN
INTRAVENOUS | Status: DC | PRN
  Administered 2019-12-08: 40 ug via INTRAVENOUS
  Administered 2019-12-08 (×3): 80 ug via INTRAVENOUS

## 2019-12-08 MED ORDER — TRAMADOL HCL 50 MG PO TABS
50.0000 mg | ORAL_TABLET | Freq: Four times a day (QID) | ORAL | Status: DC | PRN
  Administered 2019-12-14 – 2019-12-18 (×9): 50 mg via ORAL
  Filled 2019-12-08 (×9): qty 1

## 2019-12-08 MED ORDER — BISACODYL 5 MG PO TBEC
10.0000 mg | DELAYED_RELEASE_TABLET | Freq: Every day | ORAL | Status: DC
  Administered 2019-12-09 – 2019-12-10 (×2): 10 mg via ORAL
  Filled 2019-12-08 (×5): qty 2

## 2019-12-08 MED ORDER — ONDANSETRON HCL 4 MG/2ML IJ SOLN
4.0000 mg | Freq: Four times a day (QID) | INTRAMUSCULAR | Status: DC | PRN
  Administered 2019-12-08 – 2019-12-15 (×2): 4 mg via INTRAVENOUS
  Filled 2019-12-08: qty 2

## 2019-12-08 MED ORDER — HYDROMORPHONE HCL 1 MG/ML IJ SOLN: 0.2500 mg | INTRAMUSCULAR | Status: DC | PRN

## 2019-12-08 MED ORDER — PHENYLEPHRINE 40 MCG/ML (10ML) SYRINGE FOR IV PUSH (FOR BLOOD PRESSURE SUPPORT)
PREFILLED_SYRINGE | INTRAVENOUS | Status: AC
  Filled 2019-12-08: qty 10

## 2019-12-08 MED ORDER — ONDANSETRON HCL 4 MG/2ML IJ SOLN
INTRAMUSCULAR | Status: AC
  Filled 2019-12-08: qty 2

## 2019-12-08 MED ORDER — OXYCODONE HCL 5 MG PO TABS: 5.0000 mg | ORAL_TABLET | Freq: Once | ORAL | Status: DC | PRN

## 2019-12-08 MED ORDER — ORAL CARE MOUTH RINSE: 15.0000 mL | Freq: Once | OROMUCOSAL | Status: AC

## 2019-12-08 MED ORDER — PROPOFOL 10 MG/ML IV BOLUS
INTRAVENOUS | Status: DC | PRN
  Administered 2019-12-08: 150 mg via INTRAVENOUS

## 2019-12-08 MED ORDER — ACETAMINOPHEN 500 MG PO TABS
1000.0000 mg | ORAL_TABLET | Freq: Four times a day (QID) | ORAL | Status: AC
  Administered 2019-12-08 – 2019-12-13 (×18): 1000 mg via ORAL
  Filled 2019-12-08 (×17): qty 2

## 2019-12-08 MED ORDER — WARFARIN SODIUM 5 MG PO TABS
5.0000 mg | ORAL_TABLET | Freq: Every day | ORAL | Status: DC
  Administered 2019-12-08 – 2019-12-09 (×2): 5 mg via ORAL
  Filled 2019-12-08: qty 1

## 2019-12-08 MED ORDER — ASPIRIN EC 81 MG PO TBEC
81.0000 mg | DELAYED_RELEASE_TABLET | Freq: Every day | ORAL | Status: DC
  Administered 2019-12-09 – 2019-12-18 (×10): 81 mg via ORAL
  Filled 2019-12-08 (×10): qty 1

## 2019-12-08 MED ORDER — FENTANYL CITRATE (PF) 100 MCG/2ML IJ SOLN
INTRAMUSCULAR | Status: DC | PRN
Start: 2019-12-08 — End: 2019-12-08
  Administered 2019-12-08 (×5): 50 ug via INTRAVENOUS

## 2019-12-08 MED ORDER — VANCOMYCIN HCL IN DEXTROSE 1-5 GM/200ML-% IV SOLN
1000.0000 mg | INTRAVENOUS | Status: AC
  Administered 2019-12-08: 1000 mg via INTRAVENOUS
  Filled 2019-12-08: qty 200

## 2019-12-08 MED ORDER — ONDANSETRON HCL 4 MG/2ML IJ SOLN
INTRAMUSCULAR | Status: DC | PRN
  Administered 2019-12-08: 4 mg via INTRAVENOUS

## 2019-12-08 MED ORDER — KETOROLAC TROMETHAMINE 30 MG/ML IJ SOLN
15.0000 mg | Freq: Four times a day (QID) | INTRAMUSCULAR | Status: AC
  Administered 2019-12-08 – 2019-12-13 (×18): 15 mg via INTRAVENOUS
  Filled 2019-12-08 (×16): qty 1

## 2019-12-08 MED ORDER — LIDOCAINE 2% (20 MG/ML) 5 ML SYRINGE
INTRAMUSCULAR | Status: AC
  Filled 2019-12-08: qty 5

## 2019-12-08 MED ORDER — LACTATED RINGERS IV SOLN: INTRAVENOUS | Status: DC | PRN

## 2019-12-08 MED ORDER — CHLORHEXIDINE GLUCONATE 0.12 % MT SOLN
15.0000 mL | Freq: Once | OROMUCOSAL | Status: AC
  Administered 2019-12-08: 15 mL via OROMUCOSAL
  Filled 2019-12-08: qty 15

## 2019-12-08 MED ORDER — EPHEDRINE SULFATE-NACL 50-0.9 MG/10ML-% IV SOSY
PREFILLED_SYRINGE | INTRAVENOUS | Status: DC | PRN
  Administered 2019-12-08: 5 mg via INTRAVENOUS

## 2019-12-08 MED ORDER — BUPIVACAINE HCL (PF) 0.5 % IJ SOLN
INTRAMUSCULAR | Status: AC
  Filled 2019-12-08: qty 30

## 2019-12-08 MED ORDER — LIDOCAINE HCL (CARDIAC) PF 100 MG/5ML IV SOSY
PREFILLED_SYRINGE | INTRAVENOUS | Status: DC | PRN
  Administered 2019-12-08: 100 mg via INTRAVENOUS

## 2019-12-08 MED ORDER — LORATADINE 10 MG PO TABS
10.0000 mg | ORAL_TABLET | Freq: Every day | ORAL | Status: DC
  Administered 2019-12-09 – 2019-12-18 (×8): 10 mg via ORAL
  Filled 2019-12-08 (×10): qty 1

## 2019-12-08 MED ORDER — BUPIVACAINE LIPOSOME 1.3 % IJ SUSP
20.0000 mL | Freq: Once | INTRAMUSCULAR | Status: DC
  Filled 2019-12-08: qty 20

## 2019-12-08 MED ORDER — ACETAMINOPHEN 160 MG/5ML PO SOLN: 1000.0000 mg | Freq: Four times a day (QID) | ORAL | Status: AC

## 2019-12-08 MED ORDER — 0.9 % SODIUM CHLORIDE (POUR BTL) OPTIME
TOPICAL | Status: DC | PRN
  Administered 2019-12-08: 2000 mL

## 2019-12-08 MED ORDER — DEXAMETHASONE SODIUM PHOSPHATE 10 MG/ML IJ SOLN
INTRAMUSCULAR | Status: DC | PRN
  Administered 2019-12-08: 4 mg via INTRAVENOUS

## 2019-12-08 MED ORDER — DEXAMETHASONE SODIUM PHOSPHATE 10 MG/ML IJ SOLN
INTRAMUSCULAR | Status: AC
  Filled 2019-12-08: qty 1

## 2019-12-08 SURGICAL SUPPLY — 134 items
BLADE CLIPPER SURG (BLADE) IMPLANT
BLADE SURG 11 STRL SS (BLADE) ×2 IMPLANT
BNDG COHESIVE 6X5 TAN STRL LF (GAUZE/BANDAGES/DRESSINGS) IMPLANT
CANISTER SUCT 3000ML PPV (MISCELLANEOUS) ×4 IMPLANT
CANNULA REDUC XI 12-8 STAPL (CANNULA) ×2
CANNULA REDUCER 12-8 DVNC XI (CANNULA) ×2 IMPLANT
CATH ROBINSON RED A/P 16FR (CATHETERS) ×2 IMPLANT
CATH THORACIC 28FR (CATHETERS) ×2 IMPLANT
CATH THORACIC 28FR RT ANG (CATHETERS) IMPLANT
CATH THORACIC 36FR (CATHETERS) IMPLANT
CATH THORACIC 36FR RT ANG (CATHETERS) IMPLANT
CATH TROCAR 20FR (CATHETERS) IMPLANT
CHLORAPREP W/TINT 26 (MISCELLANEOUS) ×4 IMPLANT
CLIP VESOCCLUDE MED 6/CT (CLIP) IMPLANT
CNTNR URN SCR LID CUP LEK RST (MISCELLANEOUS) ×10 IMPLANT
CONN ST 1/4X3/8  BEN (MISCELLANEOUS)
CONN ST 1/4X3/8 BEN (MISCELLANEOUS) IMPLANT
CONN Y 3/8X3/8X3/8  BEN (MISCELLANEOUS)
CONN Y 3/8X3/8X3/8 BEN (MISCELLANEOUS) IMPLANT
CONT SPEC 4OZ STRL OR WHT (MISCELLANEOUS) ×10
COVER SURGICAL LIGHT HANDLE (MISCELLANEOUS) IMPLANT
DEFOGGER SCOPE WARMER CLEARIFY (MISCELLANEOUS) ×2 IMPLANT
DERMABOND ADVANCED (GAUZE/BANDAGES/DRESSINGS) ×1
DERMABOND ADVANCED .7 DNX12 (GAUZE/BANDAGES/DRESSINGS) ×1 IMPLANT
DISSECTOR BLUNT TIP ENDO 5MM (MISCELLANEOUS) IMPLANT
DRAIN CHANNEL 28F RND 3/8 FF (WOUND CARE) IMPLANT
DRAIN CHANNEL 32F RND 10.7 FF (WOUND CARE) IMPLANT
DRAPE ARM DVNC X/XI (DISPOSABLE) ×4 IMPLANT
DRAPE COLUMN DVNC XI (DISPOSABLE) ×1 IMPLANT
DRAPE CV SPLIT W-CLR ANES SCRN (DRAPES) ×2 IMPLANT
DRAPE DA VINCI XI ARM (DISPOSABLE) ×4
DRAPE DA VINCI XI COLUMN (DISPOSABLE) ×1
DRAPE ORTHO SPLIT 77X108 STRL (DRAPES) ×1
DRAPE SURG ORHT 6 SPLT 77X108 (DRAPES) ×1 IMPLANT
DRAPE WARM FLUID 44X44 (DRAPES) IMPLANT
ELECT BLADE 6.5 EXT (BLADE) ×2 IMPLANT
ELECT REM PT RETURN 9FT ADLT (ELECTROSURGICAL) ×2
ELECTRODE REM PT RTRN 9FT ADLT (ELECTROSURGICAL) ×1 IMPLANT
GAUZE KITTNER 4X10 (MISCELLANEOUS) IMPLANT
GAUZE KITTNER 4X5 RF (MISCELLANEOUS) ×2 IMPLANT
GAUZE KITTNER 4X8 (MISCELLANEOUS) IMPLANT
GAUZE SPONGE 4X4 12PLY STRL (GAUZE/BANDAGES/DRESSINGS) ×2 IMPLANT
GLOVE BIO SURGEON STRL SZ 6.5 (GLOVE) ×8 IMPLANT
GLOVE BIO SURGEON STRL SZ7.5 (GLOVE) ×4 IMPLANT
GLOVE BIOGEL PI IND STRL 6.5 (GLOVE) ×6 IMPLANT
GLOVE BIOGEL PI INDICATOR 6.5 (GLOVE) ×6
GOWN STRL REUS W/ TWL LRG LVL3 (GOWN DISPOSABLE) ×6 IMPLANT
GOWN STRL REUS W/ TWL XL LVL3 (GOWN DISPOSABLE) ×2 IMPLANT
GOWN STRL REUS W/TWL 2XL LVL3 (GOWN DISPOSABLE) ×2 IMPLANT
GOWN STRL REUS W/TWL LRG LVL3 (GOWN DISPOSABLE) ×6
GOWN STRL REUS W/TWL XL LVL3 (GOWN DISPOSABLE) ×2
HEMOSTAT SURGICEL 2X14 (HEMOSTASIS) ×2 IMPLANT
IRRIGATION STRYKERFLOW (MISCELLANEOUS) ×1 IMPLANT
IRRIGATOR STRYKERFLOW (MISCELLANEOUS) ×2
KIT BASIN OR (CUSTOM PROCEDURE TRAY) ×2 IMPLANT
KIT SUCTION CATH 14FR (SUCTIONS) IMPLANT
KIT TURNOVER KIT B (KITS) ×2 IMPLANT
LOOP VESSEL SUPERMAXI WHITE (MISCELLANEOUS) IMPLANT
NEEDLE 22X1 1/2 (OR ONLY) (NEEDLE) ×2 IMPLANT
NEEDLE HYPO 25GX1X1/2 BEV (NEEDLE) IMPLANT
NS IRRIG 1000ML POUR BTL (IV SOLUTION) ×4 IMPLANT
OBTURATOR OPTICAL STANDARD 8MM (TROCAR)
OBTURATOR OPTICAL STND 8 DVNC (TROCAR)
OBTURATOR OPTICALSTD 8 DVNC (TROCAR) IMPLANT
PACK CHEST (CUSTOM PROCEDURE TRAY) ×2 IMPLANT
PAD ARMBOARD 7.5X6 YLW CONV (MISCELLANEOUS) ×6 IMPLANT
PORT ACCESS TROCAR AIRSEAL 12 (TROCAR) ×1 IMPLANT
PORT ACCESS TROCAR AIRSEAL 5M (TROCAR) ×1
POUCH ENDO CATCH II 15MM (MISCELLANEOUS) IMPLANT
POUCH SPECIMEN RETRIEVAL 10MM (ENDOMECHANICALS) IMPLANT
RELOAD STAPLER 2.5X45 WHT DVNC (STAPLE) ×3 IMPLANT
RELOAD STAPLER 3.5X45 BLU DVNC (STAPLE) ×11 IMPLANT
RELOAD STAPLER 4.3X45 GRN DVNC (STAPLE) ×1 IMPLANT
RETRACTOR WOUND ALXS 19CM XSML (INSTRUMENTS) IMPLANT
RTRCTR WOUND ALEXIS 19CM XSML (INSTRUMENTS)
SCISSORS LAP 5X35 DISP (ENDOMECHANICALS) IMPLANT
SEAL CANN UNIV 5-8 DVNC XI (MISCELLANEOUS) ×2 IMPLANT
SEAL XI 5MM-8MM UNIVERSAL (MISCELLANEOUS) ×2
SEALANT PROGEL (MISCELLANEOUS) IMPLANT
SEALANT SURG COSEAL 4ML (VASCULAR PRODUCTS) IMPLANT
SEALANT SURG COSEAL 8ML (VASCULAR PRODUCTS) IMPLANT
SEALER LIGASURE MARYLAND 30 (ELECTROSURGICAL) IMPLANT
SET TRI-LUMEN FLTR TB AIRSEAL (TUBING) ×2 IMPLANT
SET TUBE SMOKE EVAC HIGH FLOW (TUBING) IMPLANT
SOLUTION ELECTROLUBE (MISCELLANEOUS) ×2 IMPLANT
SPONGE INTESTINAL PEANUT (DISPOSABLE) IMPLANT
SPONGE TONSIL TAPE 1 RFD (DISPOSABLE) IMPLANT
STAPLE RELOAD 45 2.0 GRAY (STAPLE) ×1
STAPLE RELOAD 45 2.0 GRAY DVNC (STAPLE) ×1 IMPLANT
STAPLER 45 SUREFORM CVD (STAPLE) ×1
STAPLER 45 SUREFORM CVD DVNC (STAPLE) ×1 IMPLANT
STAPLER CANNULA SEAL DVNC XI (STAPLE) ×2 IMPLANT
STAPLER CANNULA SEAL XI (STAPLE) ×2
STAPLER RELOAD 2.5X45 WHITE (STAPLE) ×3
STAPLER RELOAD 2.5X45 WHT DVNC (STAPLE) ×3
STAPLER RELOAD 3.5X45 BLU DVNC (STAPLE) ×11
STAPLER RELOAD 3.5X45 BLUE (STAPLE) ×11
STAPLER RELOAD 4.3X45 GREEN (STAPLE) ×1
STAPLER RELOAD 4.3X45 GRN DVNC (STAPLE) ×1
STOPCOCK 4 WAY LG BORE MALE ST (IV SETS) ×2 IMPLANT
SUT MNCRL AB 3-0 PS2 18 (SUTURE) IMPLANT
SUT MON AB 2-0 CT1 36 (SUTURE) IMPLANT
SUT PDS AB 1 CTX 36 (SUTURE) IMPLANT
SUT PROLENE 4 0 RB 1 (SUTURE)
SUT PROLENE 4-0 RB1 .5 CRCL 36 (SUTURE) IMPLANT
SUT SILK  1 MH (SUTURE) ×2
SUT SILK 1 MH (SUTURE) ×2 IMPLANT
SUT SILK 1 TIES 10X30 (SUTURE) IMPLANT
SUT SILK 2 0 SH (SUTURE) IMPLANT
SUT SILK 2 0SH CR/8 30 (SUTURE) IMPLANT
SUT VIC AB 1 CTX 36 (SUTURE)
SUT VIC AB 1 CTX36XBRD ANBCTR (SUTURE) IMPLANT
SUT VIC AB 2-0 CT1 27 (SUTURE) ×1
SUT VIC AB 2-0 CT1 TAPERPNT 27 (SUTURE) ×1 IMPLANT
SUT VIC AB 3-0 SH 27 (SUTURE) ×2
SUT VIC AB 3-0 SH 27X BRD (SUTURE) ×2 IMPLANT
SUT VICRYL 0 TIES 12 18 (SUTURE) ×2 IMPLANT
SUT VICRYL 0 UR6 27IN ABS (SUTURE) ×4 IMPLANT
SUT VICRYL 2 TP 1 (SUTURE) IMPLANT
SYR 10ML LL (SYRINGE) ×2 IMPLANT
SYR 20ML LL LF (SYRINGE) ×2 IMPLANT
SYR 50ML LL SCALE MARK (SYRINGE) ×2 IMPLANT
SYSTEM RETRIEVAL ANCHOR 15 (MISCELLANEOUS) ×2 IMPLANT
SYSTEM RETRIEVAL ANCHOR 8 (MISCELLANEOUS) ×2 IMPLANT
SYSTEM SAHARA CHEST DRAIN ATS (WOUND CARE) ×2 IMPLANT
TAPE CLOTH 4X10 WHT NS (GAUZE/BANDAGES/DRESSINGS) ×2 IMPLANT
TAPE CLOTH SURG 4X10 WHT LF (GAUZE/BANDAGES/DRESSINGS) ×2 IMPLANT
TIP APPLICATOR SPRAY EXTEND 16 (VASCULAR PRODUCTS) IMPLANT
TOWEL GREEN STERILE (TOWEL DISPOSABLE) ×2 IMPLANT
TRAY FOLEY MTR SLVR 16FR STAT (SET/KITS/TRAYS/PACK) ×2 IMPLANT
TROCAR BLADELESS 15MM (ENDOMECHANICALS) IMPLANT
TROCAR XCEL 12X100 BLDLESS (ENDOMECHANICALS) ×2 IMPLANT
TUBING EXTENTION W/L.L. (IV SETS) ×2 IMPLANT
WATER STERILE IRR 1000ML POUR (IV SOLUTION) ×2 IMPLANT

## 2019-12-08 NOTE — Brief Op Note (Signed)
12/08/2019  11:34 AM  PATIENT:  Marcus [name]  74 y.o. male  PRE-OPERATIVE DIAGNOSIS:  RIGHT UPPER LOBE LUNG MASS  POST-OPERATIVE DIAGNOSIS:  ADENOCARCINOMA of the RIGHT UPPER LOBE LUNG MASS  PROCEDURE:  XI ROBOTIC ASSISTED THORACOSCOPY,RIGHT UPPER LOBE WEDGE RESECTION, followed by RUL, LYMPH NODE DISSECTION, and INTERCOSTAL NERVE BLOCK  SURGEON:  Surgeon(s) and Role:    Lightfoot, Lucile Crater, MD - Primary  PHYSICIAN ASSISTANT: Lars Pinks PA-C  ANESTHESIA:   general  EBL:  25 mL   BLOOD ADMINISTERED:none  DRAINS: 28 Chest tube placed in the right pleural space   LOCAL MEDICATIONS USED:  OTHER Exparel  SPECIMEN:  Source of Specimen:  Wedge RUL, RUL, multiple lymph nodes  DISPOSITION OF SPECIMEN:  PATHOLOGY  COUNTS CORRECT:  YES  DICTATION: .Dragon Dictation  PLAN OF CARE: Admit to inpatient   PATIENT DISPOSITION:  PACU - hemodynamically stable.   Delay start of Pharmacological VTE agent (>24hrs) due to surgical blood loss or risk of bleeding: yes

## 2019-12-08 NOTE — Anesthesia Postprocedure Evaluation (Signed)
Anesthesia Post Note  Patient: Marny Lowenstein  Procedure(s) Performed: XI ROBOTIC ASSISTED THORASCOPY-RIGHT UPPER LOBE WEDGE RESECTION (Right Chest) XI ROBOTIC ASSISTED THORASCOPY-RIGHT UPPER LOBECTOMY (Right Chest) INTERCOSTAL NERVE BLOCK (Right Chest) NODE DISSECTION (Right Chest)     Patient location during evaluation: PACU Anesthesia Type: General Level of consciousness: awake and alert Pain management: pain level controlled Vital Signs Assessment: post-procedure vital signs reviewed and stable Respiratory status: spontaneous breathing, nonlabored ventilation, respiratory function stable and patient connected to nasal cannula oxygen Cardiovascular status: blood pressure returned to baseline and stable Postop Assessment: no apparent nausea or vomiting Anesthetic complications: no   No complications documented.  Last Vitals:  Vitals:   12/08/19 1250 12/08/19 1305  BP: (!) 152/71 (!) 145/68  Pulse: (!) 49 (!) 42  Resp: 15 14  Temp:  36.4 C  SpO2: 95% 95%    Last Pain:  Vitals:   12/08/19 1305  PainSc: 0-No pain                 Rossanna Spitzley S

## 2019-12-08 NOTE — Op Note (Signed)
[address]       [place] [address]             [phone]        12/08/2019  Patient:  Marcus [name] Pre-Op Dx: Right upper lobe pulmonary mass Post-op Dx: Right upper lobe adenocarcinoma Procedure: - Robotic assisted right video thoracoscopy -Right upper lobe wedge resection -Right upper completion lobectomy - Mediastinal lymph node sampling - Intercostal nerve block  Surgeon and Role:      * Antinette Keough, Lucile Crater, MD - Primary    *D. Tacy Dura, PA-C- assisting  Anesthesia  general EBL: 100 ml Blood Administration: None Specimen: Right upper lobe wedge resection, right upper lobe station 4, 8, 10 lymph nodes.  Multiple hilar lymph nodes.  Drains: 41 F argyle chest tube in right chest Counts: correct   Indications: 74 year old male with a 3.9 cm right upper lobe pulmonary mass that has had an increasing solid component.  Additionally a PET/CT showed avidity within this area.  He was not a candidate for a CT-guided biopsy given its proximity to the aorta or navigational bronchoscopy.  The risks and benefits for a robotic assisted wedge resection completion lobectomy were discussed and he was agreeable to proceed with that plan.  Findings: The right upper lobe was adherent to the mediastinum.  Previous pledgets from his open heart surgery were also evident close to where the lung was stuck.  The pulmonary mass was wedged out and the biopsy did show adenocarcinoma.  The pulmonary vein was stuck to the pulmonary artery.  Meticulous dissection was performed.  He had a large firm lymph node that was at the base of the truncus anterior.  Operative Technique: After the risks, benefits and alternatives were thoroughly discussed, the patient was brought to the operative theatre.  Anesthesia was induced, and the patient was then placed in a left lateral decubitus position and was prepped and draped in normal sterile fashion.  An appropriate surgical pause was  performed, and pre-operative antibiotics were dosed accordingly.  We began by placing our 4 robotic ports in the the 7th intercostal space targeting the hilum of the lung.  A 40mm assistant port was placed in the 9th intercostal space in the anterior axillary line.  The robot was then docked and all instruments were passed under direct visualization.    The upper lobe was dissected off of the mediastinum where it was adhesed.  A generous wedge resection was performed including the pulmonary mass and this was sent off for specimen.  The frozen section did show adenocarcinoma.  The lung was then retracted superiorly, and the inferior pulmonary ligament was divided.  The hilum was mobilized anteriorly and posteriorly.  We identified the upper lobe pulmonary vein, and after careful isolation, it was divided with a vascular stapler.  We next moved to the apex of the hilum..  The artery was then divided with a vascular load stapler.  The bronchus to the upper lobe was then isolated.  After a test clamp, with good ventilation of the middle and lower lobes, the bronchus was then divided.  The fissure was completed, and the specimen was passed into an endocatch bag.  It was removed from the superior access site.    Lymph nodes were then sampled at levels 4, 8, 10, and multiple hilar nodes.  The chest was irrigated, and an air leak test was performed.  An intercostal nerve block was performed under direct visualization.  A 28 F chest with then placed, and we watch the remaining lobes re-expand.  The skin and soft tissue were closed with absorbable suture    The patient tolerated the procedure without any immediate complications, and was transferred to the PACU in stable condition.  Marcus [name] Marcus [name]

## 2019-12-08 NOTE — Anesthesia Procedure Notes (Signed)
Procedure Name: Intubation Date/Time: 12/08/2019 8:23 AM Performed by: Colin Benton, CRNA Pre-anesthesia Checklist: Patient identified, Emergency Drugs available, Patient being monitored, Suction available and Timeout performed Patient Re-evaluated:Patient Re-evaluated prior to induction Oxygen Delivery Method: Circle system utilized Preoxygenation: Pre-oxygenation with 100% oxygen Induction Type: IV induction Ventilation: Oral airway inserted - appropriate to patient size and Mask ventilation without difficulty Laryngoscope Size: Miller and 2 Grade View: Grade I Tube type: Oral Endobronchial tube: Double lumen EBT, EBT position confirmed by fiberoptic bronchoscope, EBT position confirmed by auscultation and Left and 39 Fr Number of attempts: 1 Airway Equipment and Method: Stylet Placement Confirmation: ETT inserted through vocal cords under direct vision,  breath sounds checked- equal and bilateral and positive ETCO2 ETT to lip (cm): Secured at biforcation of tubes at appropriate level based on visualization of DBT. Tube secured with: Tape Dental Injury: Teeth and Oropharynx as per pre-operative assessment  Comments: Placed by Hilda Blades

## 2019-12-08 NOTE — Progress Notes (Signed)
Pt was on coumadin for AVR/afib. He is s/p robotic assisted right video thoracoscopy. Coumadin was resumed today by TCTS. Full dose lovenox and prophylaxis were ordered. D/w Dr. Prescott Gum about timing of Lovenox. He stated to hold for tonight and discuss with team in AM.   Onnie Boer, PharmD, BCIDP, AAHIVP, CPP Infectious Disease Pharmacist 12/08/2019 7:51 PM

## 2019-12-08 NOTE — H&P (Signed)
[address]       [place] [address]             [phone]       No events since his clinic appointment PET shows persistent lesion, with avidity Stress test shows low risk disease OR today for Right RATS, wedge resection of the upper lobe, possible completion lobectomy   Per my last clinic note  [facility] Record #[id number] Date of Birth: 16-Nov-1945  Referring: Troy Sine, MD Primary Care: Norberto Sorenson, MD Primary Cardiologist: No primary care provider on file.  Chief Complaint:        Chief Complaint  Patient presents with  . Lung Lesion    evaluate RULobe lesion per CTA 11/06/19, PET 04/30/19, PFT 06/10/19    History of Present Illness:    Marcus [name] 74 y.o. male 74 year old male referred by Dr. Lamonte Sakai for surgical evaluation of 3.9 cm right upper lobe pulmonary mass. This was originally found on cross-sectional imaging back in July 2020. On repeat imaging visits around 2.5 cm adenoma PET scan from January 2021 had an SUV max 2.4. He is a former smoker who quit in 1970s. He is asymptomatic from a respiratory standpoint. He denies any weight changes or any neurologic symptoms. In 2009 he underwent aortic valve replacement with mechanical valve by Dr. Caffie Pinto. He has had no issues since then. He is somewhat hesitant to discuss surgical options.    Smoking Hx: Former smoker   Zubrod Score: At the time of surgery this patient's most appropriate activity status/level should be described as: [x] ?    0    Normal activity, no symptoms [] ?    1    Restricted in physical strenuous activity but ambulatory, able to do out light work [] ?    2    Ambulatory and capable of self care, unable to do work activities, up and about               >50 % of waking hours                              [] ?    3    Only limited self care, in bed greater than 50% of waking hours [] ?    4    Completely disabled, no self care, confined to  bed or chair [] ?    5    Moribund   Past Medical History:  Diagnosis Date  . Atrial fibrillation ([place])   . Dyspnea    with exertion   . Hypertension   . Obstructive sleep apnea 10/25/2007    no longer on cpap  . S/P AVR    2D ECHO, 04/25/2011 - EF >55%, Right ventricle-mild-moderately dilated  . Swelling of limb    LEA VENOUS, 08/22/2009 - no evidence of deep vein or superficial thrombosis; partially rupturing Baker's Cyst         Past Surgical History:  Procedure Laterality Date  . CARDIAC CATHETERIZATION Bilateral 05/10/2007   Significant 1-vessel disease, severely dilated aortic root with moderate severe aortic insufficiency  . CARDIAC SURGERY    . CARDIOVERSION  08/02/2007   150 Joule biphasic shock with restoration of sinus rhythm. Heart rate 60.  . cataract surgery     . COLONOSCOPY WITH PROPOFOL N/A 12/15/2016   Procedure: COLONOSCOPY WITH PROPOFOL;  Surgeon: Carol Ada, MD;  Location: [facility];  Service: Endoscopy;  Laterality: N/A;  . NM MYOVIEW LTD  04/08/2007   No evidence of inducible myocardial ischemia  . THYROIDECTOMY, PARTIAL    . torn meniscus in right knee surgery            Family History  Problem Relation Age of Onset  . Cancer Father      Social History       Tobacco Use  Smoking Status Former Smoker  . Types: Cigarettes  . Quit date: 04/11/1987  . Years since quitting: 32.6  Smokeless Tobacco Never Used    Social History      Substance and Sexual Activity  Alcohol Use No          Allergies  Allergen Reactions  . Amlodipine Anaphylaxis    Weakness and fatigue   . Tetanus Toxoids Anaphylaxis  . Crestor [Rosuvastatin]     Myalgia   . Penicillins Hives    Has patient had a PCN reaction causing immediate rash, facial/tongue/throat swelling, SOB or lightheadedness with hypotension: No Has patient had a PCN reaction causing severe rash involving mucus membranes or skin necrosis: Yes Has  patient had a PCN reaction that required hospitalization: No Has patient had a PCN reaction occurring within the last 10 years: No If all of the above answers are "NO", then may proceed with Cephalosporin use.           Current Outpatient Medications  Medication Sig Dispense Refill  . aspirin EC 81 MG tablet Take 81 mg by mouth daily.    Marland Kitchen atorvastatin (LIPITOR) 80 MG tablet TAKE 1 TABLET BY MOUTH EVERY DAY 90 tablet 2  . CALCIUM PO Take 1,200 mg by mouth daily.    . carboxymethylcellulose (REFRESH PLUS) 0.5 % SOLN Place 1 drop into both eyes daily as needed (dry eyes).     . cetirizine (ZYRTEC) 10 MG tablet Take 10 mg by mouth daily.    Marland Kitchen CINNAMON PO Take 1,000 mg by mouth daily.     . clindamycin (CLEOCIN) 300 MG capsule Take 2 tablets (600 mg total) 30-60 minutes prior to dental or medical procedure. 8 capsule 1  . Multiple Vitamins-Minerals (MULTIVITAMIN WITH MINERALS) tablet Take 1 tablet by mouth daily.    . Omega-3 Krill Oil 500 MG CAPS Take 500 mg by mouth daily.     . ramipril (ALTACE) 10 MG capsule TAKE 1 CAPSULE BY MOUTH EVERY DAY 90 capsule 3  . warfarin (COUMADIN) 10 MG tablet TAKE 1 TO 1 AND 1/2 TABLETS DAILY AS DIRECTED BY COUMADIN CLINIC 135 tablet 1  . cholecalciferol (VITAMIN D) 1000 units tablet Take 1,000 Units by mouth daily.    . Tiotropium Bromide Monohydrate (SPIRIVA RESPIMAT) 2.5 MCG/ACT AERS Inhale 1 puff into the lungs daily. 4 g 5  . vitamin B-12 (CYANOCOBALAMIN) 1000 MCG tablet Take 1,000 mcg by mouth daily.     No current facility-administered medications for this visit.    Review of Systems  Constitutional: Negative.   Respiratory: Negative.   Cardiovascular: Negative.   Neurological: Negative.   Psychiatric/Behavioral: The patient is nervous/anxious.      PHYSICAL EXAMINATION: BP (!) 143/81 (BP Location: Right Arm, Patient Position: Sitting, Cuff Size: Normal)   Pulse (!) 48   Temp (!) 97.3 F (36.3 C)   Resp 16   Ht  5\' 11"  (1.803 m)   Wt 179 lb (81.2 kg)   SpO2 96% Comment: RA  BMI 24.97 kg/m  Physical Exam Constitutional:  General: He is not in acute distress.    Appearance: Normal appearance. He is not ill-appearing.  Eyes:     Extraocular Movements: Extraocular movements intact.     Conjunctiva/sclera: Conjunctivae normal.  Cardiovascular:     Rate and Rhythm: Normal rate and regular rhythm.  Pulmonary:     Effort: Pulmonary effort is normal. No respiratory distress.  Musculoskeletal:        General: Normal range of motion.  Skin:    General: Skin is warm and dry.  Neurological:     General: No focal deficit present.     Mental Status: He is alert and oriented to person, place, and time.     Diagnostic Studies & Laboratory data:     Recent Radiology Findings:    Imaging Results  CT ANGIO CHEST AORTA W/CM & OR WO/CM  Result Date: 11/06/2019 CLINICAL DATA:  Status post thoracic aortic aneurysm air, pulmonary nodules EXAM: CT ANGIOGRAPHY CHEST WITH CONTRAST TECHNIQUE: Multidetector CT imaging of the chest was performed using the standard protocol during bolus administration of intravenous contrast. Multiplanar CT image reconstructions and MIPs were obtained to evaluate the vascular anatomy. CONTRAST:  179mL OMNIPAQUE IOHEXOL 350 MG/ML SOLN COMPARISON:  04/30/2019, 11/17/2018 FINDINGS: Cardiovascular: Stable appearance status post ascending aortic aneurysm repair and aortic valve replacement. Similar slight angulation of the ascending aortic repair without evidence of acute vascular process by CTA. Atherosclerotic changes of the aorta noted. Patent 3 vessel arch anatomy. Previous coronary bypass changes noted. Normal heart size. No pericardial effusion. Native coronary atherosclerosis. No significant acute pulmonary embolus demonstrated. Central venous anatomy is all patent. 3.8 cm proximal aortic arch, stable 3.4 cm proximal descending thoracic aorta. 2.9 cm distal descending thoracic  aorta. 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 Celiac and SMA appear to remain patent. Visualized renal arteries have calcified origins but appear patent. Mediastinum/Nodes: No enlarged mediastinal, hilar, or axillary lymph nodes. Thyroid gland, trachea, and esophagus demonstrate no significant findings. Lungs/Pleura: Stable background emphysema pattern. Right upper lobe anteromedial mixed ground-glass/pleural based opacity has enlarged. Dominant area now measures 3.9 x 2.8 cm and abuts against the anterior mediastinum medially and the pleural surface anteriorly. There is also increase in size and number of adjacent subcentimeter ground-glass and subsolid nodules, images 36 through 49 series 5. Slow progression since the last exam is concerning for low-grade adenocarcinoma over a chronic infectious/inflammatory process. No pleural abnormality, effusion or pneumothorax. Upper Abdomen: Right upper pole renal cyst again noted. No other acute upper abdominal finding. Musculoskeletal: Degenerative changes noted of the spine. No acute compression fracture. Previous median sternotomy noted. Review of the MIP images confirms the above findings. IMPRESSION: Stable appearance of the aortic root graft and aortic valve replacement. No other acute intrathoracic vascular finding. Interval enlargement of the anteromedial right upper lobe mixed ground-glass/airspace opacity with increase in size and number of adjacent satellite sub solid and ground-glass nodules. Adenocarcinoma is considered. Thoracic surgery consultation is recommended. These recommendations are taken from: Recommendations for the Management of Subsolid Pulmonary Nodules Detected at CT: A Statement from the [facility] 2013; 266:1, [phone]. Aortic Atherosclerosis (ICD10-I70.0). These results will be called to the ordering clinician or representative by the Radiologist Assistant, and  communication documented in the PACS or [employer]. Electronically Signed   By: Jerilynn Mages.  Shick M.D.   On: 11/06/2019 09:16        I have independently reviewed the above radiology studies  and reviewed the  findings with the patient.   Recent Lab Findings: Recent Labs        Lab Results  Component Value Date   WBC 4.8 11/11/2018   HGB 13.6 11/11/2018   HCT 41.3 11/11/2018   PLT 136 (L) 11/11/2018   GLUCOSE 85 10/28/2019   ALT 25 09/05/2012   AST 30 09/05/2012   NA 139 10/28/2019   K 5.1 10/28/2019   CL 104 10/28/2019   CREATININE 0.81 10/28/2019   BUN 10 10/28/2019   CO2 25 10/28/2019   TSH 0.846 11/17/2018   INR 3.6 (A) 11/17/2019   HGBA1C  05/22/2007    5.7 (NOTE)   The ADA recommends the following therapeutic goals for glycemic   control related to Hgb A1C measurement:   Goal of Therapy:   < 7.0% Hgb A1C   Action Suggested:  > 8.0% Hgb A1C   Ref:  Diabetes Care, 22, Suppl. 1, 1999       PFTs: March 2021 - FVC: 118% - FEV1: 120% -DLCO: 110%  Problem List: 3.9 cm right upper lobe pulmonary mass History of aortic valve replacement currently on Coumadin  Assessment / Plan:   This is a 74 year old male who presents with a 3.9 cm right upper lobe mass that shown interval growth over the last year. There is also significant groundglass opacity surrounding the nodule. The PET/CT from January of this year did showed mild uptake. The risk benefits and alternatives for surgical resection were discussed with him in detail and he is somewhat hesitant to proceed. We also covered several other options which included navigational versus CT-guided biopsy. Given the proximity to the aorta I think that a CT-guided biopsy would be a little bit risky. I will discuss the case again with Dr. Lamonte Sakai to see about options for navigational bronchoscopy if he does not want to proceed with direct resection. Surgical standpoint he does have good lung function, and would  need to undergo a stress test for cardiac clearance. Outside of that he would be a good candidate for robotic assisted wedge resection followed by a lobectomy if this proves to be a primary lung cancer. I have ordered another PET scan given that the previous one is in for 6 months old. I will contact him with the results.

## 2019-12-08 NOTE — Anesthesia Procedure Notes (Signed)
Arterial Line Insertion Start/End8/30/2021 6:55 AM, 12/08/2019 7:05 AM  Patient location: Pre-op. Preanesthetic checklist: patient identified, IV checked, site marked, risks and benefits discussed, surgical consent, monitors and equipment checked, pre-op evaluation and timeout performed Lidocaine 1% used for infiltration radial was placed Catheter size: 20 G Hand hygiene performed , maximum sterile barriers used  and Seldinger technique used Allen's test indicative of satisfactory collateral circulation Attempts: 1 Procedure performed without using ultrasound guided technique. Following insertion, dressing applied and Biopatch. Post procedure assessment: normal  Patient tolerated the procedure well with no immediate complications. Additional procedure comments: Placed by Hilda Blades.

## 2019-12-08 NOTE — Transfer of Care (Signed)
Immediate Anesthesia Transfer of Care Note  Patient: Marcus [name]  Procedure(s) Performed: XI ROBOTIC ASSISTED THORASCOPY-RIGHT UPPER LOBE WEDGE RESECTION (Right Chest) XI ROBOTIC ASSISTED THORASCOPY-RIGHT UPPER LOBECTOMY (Right Chest) INTERCOSTAL NERVE BLOCK (Right Chest) NODE DISSECTION (Right Chest)  Patient Location: PACU  Anesthesia Type:General  Level of Consciousness: drowsy, patient cooperative and responds to stimulation  Airway & Oxygen Therapy: Patient Spontanous Breathing and Patient connected to face mask oxygen  Post-op Assessment: Report given to RN and Post -op Vital signs reviewed and stable  Post vital signs: Reviewed and stable  Last Vitals:  Vitals Value Taken Time  BP 140/103 12/08/19 1204  Temp    Pulse 37 12/08/19 1209  Resp 19 12/08/19 1209  SpO2 100 % 12/08/19 1209  Vitals shown include unvalidated device data.  Last Pain:  Vitals:   12/08/19 0624  PainSc: 0-No pain      Patients Stated Pain Goal: 3 (63/14/97 0263)  Complications: No complications documented.

## 2019-12-09 ENCOUNTER — Inpatient Hospital Stay (HOSPITAL_COMMUNITY): Payer: Medicare HMO

## 2019-12-09 ENCOUNTER — Encounter (HOSPITAL_COMMUNITY): Payer: Self-pay | Admitting: Thoracic Surgery (Cardiothoracic Vascular Surgery)

## 2019-12-09 LAB — BASIC METABOLIC PANEL
Anion gap: 9 (ref 5–15)
BUN: 10 mg/dL (ref 8–23)
CO2: 23 mmol/L (ref 22–32)
Calcium: 8 mg/dL — ABNORMAL LOW (ref 8.9–10.3)
Chloride: 106 mmol/L (ref 98–111)
Creatinine, Ser: 0.81 mg/dL (ref 0.61–1.24)
GFR calc Af Amer: >60 mL/min (ref >60)
GFR calc non Af Amer: >60 mL/min (ref >60)
Glucose, Bld: 130 mg/dL — ABNORMAL HIGH (ref 70–99)
Potassium: 3.9 mmol/L (ref 3.5–5.1)
Sodium: 138 mmol/L (ref 135–145)

## 2019-12-09 LAB — CBC
HCT: 33.6 % — ABNORMAL LOW (ref 39.0–52.0)
Hemoglobin: 11.1 g/dL — ABNORMAL LOW (ref 13.0–17.0)
MCH: 32 pg (ref 26.0–34.0)
MCHC: 33 g/dL (ref 30.0–36.0)
MCV: 96.8 fL (ref 80.0–100.0)
Platelets: 111 10*3/uL — ABNORMAL LOW (ref 150–400)
RBC: 3.47 MIL/uL — ABNORMAL LOW (ref 4.22–5.81)
RDW: 13.3 % (ref 11.5–15.5)
WBC: 7.8 10*3/uL (ref 4.0–10.5)
nRBC: 0 % (ref 0.0–0.2)

## 2019-12-09 LAB — PROTIME-INR
INR: 1.1 (ref 0.8–1.2)
Prothrombin Time: 13.4 seconds (ref 11.4–15.2)

## 2019-12-09 MED ORDER — ENOXAPARIN SODIUM 80 MG/0.8ML ~~LOC~~ SOLN
80.0000 mg | Freq: Two times a day (BID) | SUBCUTANEOUS | Status: DC
  Administered 2019-12-09 – 2019-12-15 (×13): 80 mg via SUBCUTANEOUS
  Filled 2019-12-09 (×14): qty 0.8

## 2019-12-09 NOTE — Progress Notes (Addendum)
      [address]       [place] [address]             [phone]      1 Day Post-Op Procedure(s) (LRB): XI ROBOTIC ASSISTED THORASCOPY-RIGHT UPPER LOBE WEDGE RESECTION (Right) XI ROBOTIC ASSISTED THORASCOPY-RIGHT UPPER LOBECTOMY (Right) INTERCOSTAL NERVE BLOCK (Right) NODE DISSECTION (Right) Subjective: Feels okay this morning. Pain is well controlled with Tylenol  Objective: Vital signs in last 24 hours: Temp:  [97 F (36.1 C)-98.4 F (36.9 C)] 98.4 F (36.9 C) (08/31 0452) Pulse Rate:  [42-89] 53 (08/31 0452) Cardiac Rhythm: Sinus bradycardia (08/31 0700) Resp:  [11-20] 20 (08/31 0452) BP: (105-155)/(55-83) 116/64 (08/31 0452) SpO2:  [92 %-99 %] 95 % (08/31 0452) Arterial Line BP: (175-189)/(69-73) 188/71 (08/30 1635)     Intake/Output from previous day: 08/30 0701 - 08/31 0700 In: 1640 [P.O.:240; I.V.:1400] Out: 1695 [Urine:805; Blood:50; Chest Tube:840] Intake/Output this shift: No intake/output data recorded.  General appearance: alert, cooperative and no distress Heart: bradycardia Lungs: clear to auscultation bilaterally Abdomen: soft, non-tender; bowel sounds normal; no masses,  no organomegaly Extremities: extremities normal, atraumatic, no cyanosis or edema Wound: clean and dry  Lab Results: Recent Labs    12/09/19 0101  WBC 7.8  HGB 11.1*  HCT 33.6*  PLT 111*   BMET:  Recent Labs    12/09/19 0101  NA 138  K 3.9  CL 106  CO2 23  GLUCOSE 130*  BUN 10  CREATININE 0.81  CALCIUM 8.0*    PT/INR:  Recent Labs    12/09/19 0101  LABPROT 13.4  INR 1.1   ABG    Component Value Date/Time   PHART 7.452 (H) 12/04/2019 1308   HCO3 24.6 12/04/2019 1308   TCO2 27 08/22/2009 1222   ACIDBASEDEF 5.0 (H) 05/23/2007 0000   O2SAT 97.8 12/04/2019 1308   CBG (last 3)  No results for input(s): GLUCAP in the last 72 hours.  Assessment/Plan: S/P Procedure(s) (LRB): XI ROBOTIC ASSISTED THORASCOPY-RIGHT UPPER LOBE WEDGE  RESECTION (Right) XI ROBOTIC ASSISTED THORASCOPY-RIGHT UPPER LOBECTOMY (Right) INTERCOSTAL NERVE BLOCK (Right) NODE DISSECTION (Right)  1. CV-BP well controlled, bradycardia not on a bb 2. Pulm- CXR stable, on room air with good oxygen saturation. Chest tube with 840cc/since surgery 3. Renal-creatinine 0.81, electrolytes okay 4. H and H stable 11.1/33.6 5. Pain control: tramadol, tylenol, and toradol  Plan: Chest tube to water seal. He doesn't have an incentive spirometer, so will get one for him. Continue chest tube for another day due to output. No air leak.    LOS: 1 day    Elgie Collard 12/09/2019   Agree with above pulm toilet and IS today Will transition to [place] On lovenox bridge until INR therapeutic.  Varsha Knock Bary Leriche

## 2019-12-09 NOTE — Hospital Course (Addendum)
HPI:   Marcus [name] 74 y.o. male 74 year old male referred by Dr. Lamonte Sakai for surgical evaluation of 3.9 cm right upper lobe pulmonary mass. This was originally found on cross-sectional imaging back in July 2020. On repeat imaging visits around 2.5 cm adenoma PET scan from January 2021 had an SUV max 2.4. He is a former smoker who quit in 1970s. He is asymptomatic from a respiratory standpoint. He denies any weight changes or any neurologic symptoms. In 2009 he underwent aortic valve replacement with mechanical valve by Dr. Caffie Pinto. He has had no issues since then. He is somewhat hesitant to discuss surgical options.  Hospital Course:   On 12/08/2019 [name] underwent a robotic assisted right video thoracoscopy, right upper lobe wedge resection and right upper lobectomy. He tolerated the procedure well and was transferred to the telemetry unit for continued care. POD 1 his pain was mostly controlled with tylenol. We placed his chest tube to water seal but left it in place due to the output amount. His creatinine remained stable. We encouraged use of incentive spirometry hourly. He continued to have a chest tube leak and his chest xray showed a 15% pneumothorax on the right side. We continued dosing of his coumadin and his INR started to climb.  His chest tube was removed on ***. Follow-up CXR showed: ***. Today, the patient is tolerating room air, his incisions are healing well, he is ambulating with limited assistance, and he is ready for discharge home.

## 2019-12-09 NOTE — Plan of Care (Signed)
  Problem: Education: Goal: Knowledge of disease or condition will improve Outcome: Progressing Goal: Knowledge of the prescribed therapeutic regimen will improve Outcome: Progressing   Problem: Activity: Goal: Risk for activity intolerance will decrease Outcome: Progressing   Problem: Cardiac: Goal: Will achieve and/or maintain hemodynamic stability Outcome: Progressing   Problem: Clinical Measurements: Goal: Postoperative complications will be avoided or minimized Outcome: Progressing   Problem: Respiratory: Goal: Respiratory status will improve Outcome: Progressing   Problem: Pain Management: Goal: Pain level will decrease Outcome: Progressing   Problem: Skin Integrity: Goal: Wound healing without signs and symptoms infection will improve Outcome: Progressing

## 2019-12-09 NOTE — Discharge Instructions (Signed)
Discharge Instructions:  1. You may shower, please wash incisions daily with soap and water and keep dry.  If you wish to cover wounds with dressing you may do so but please keep clean and change daily.  No tub baths or swimming until incisions have completely healed.  If your incisions become red or develop any drainage please call our office at [phone]  2. No Driving until cleared by our office and you are no longer using narcotic pain medications  3. Monitor your weight daily.. Please use the same scale and weigh at same time... If you gain 3-5 lbs in 48 hours with associated lower extremity swelling, please contact our office at [phone]  4. Fever of 101.5 for at least 24 hours with no source, please contact our office at [phone]  5. Activity- up as tolerated, please walk at least 3 times per day.  Avoid strenuous activity, no lifting, pushing, or pulling with your arms over 8-10 lbs for a minimum of 6 weeks  6. If any questions or concerns arise, please do not hesitate to contact our office at [facility], Care After This sheet gives you information about how to care for yourself after your procedure. Your health care provider may also give you more specific instructions. If you have problems or questions, contact your health care provider. What can I expect after the procedure? After the procedure, it is common to have:  Some pain and soreness in your chest.  Pain when breathing in (inhaling) and coughing.  Constipation.  Fatigue.  Difficulty sleeping. Follow these instructions at home: Preventing pneumonia  Take deep breaths or do breathing exercises as instructed by your health care provider. Doing this helps prevent lung infection (pneumonia).  Cough frequently. Coughing may cause discomfort, but it is important to clear mucus (phlegm) and expand your lungs. If it hurts to cough, hold a pillow against your chest or place  the palms of both hands on top of the incision (use splinting) when you cough. This may help relieve discomfort.  If you were given an incentive spirometer, use it as directed. An incentive spirometer is a tool that measures how well you are filling your lungs with each breath.  Participate in pulmonary rehabilitation as directed by your health care provider. This is a program that combines education, exercise, and support from a team of specialists. The goal is to help you heal and get back to your normal activities as soon as possible. Medicines  Take over-the-counter or prescription medicines only as told by your health care provider.  If you have pain, take pain-relieving medicine before your pain becomes severe. This is important because if your pain is under control, you will be able to breathe and cough more comfortably.  If you were prescribed an antibiotic medicine, take it as told by your health care provider. Do not stop taking the antibiotic even if you start to feel better. Activity  Ask your health care provider what activities are safe for you.  Avoid activities that use your chest muscles for at least 3-4 weeks.  Do not lift anything that is heavier than 10 lb (4.5 kg), or the limit that your health care provider tells you, until he or she says that it is safe. Incision care  Follow instructions from your health care provider about how to take care of your incision(s). Make sure you: ? Wash your hands with soap and water before you change your bandage (dressing).  If soap and water are not available, use hand sanitizer. ? Change your dressing as told by your health care provider. ? Leave stitches (sutures), skin glue, or adhesive strips in place. These skin closures may need to stay in place for 2 weeks or longer. If adhesive strip edges start to loosen and curl up, you may trim the loose edges. Do not remove adhesive strips completely unless your health care provider tells you  to do that.  Keep your dressing dry until it has been removed.  Check your incision area every day for signs of infection. Check for: ? Redness, swelling, or pain. ? Fluid or blood. ? Warmth. ? Pus or a bad smell. Bathing  Do not take baths, swim, or use a hot tub until your health care provider approves. You may take showers.  After your dressing has been removed, use soap and water to gently wash your incision area. Do not use anything else to clean your incision(s) unless your health care provider tells you to do this. Driving   Do not drive until your health care provider approves.  Do not drive or use heavy machinery while taking prescription pain medicine. Eating and drinking  Eat a healthy, balanced diet as instructed by your health care provider. A healthy diet includes plenty of fresh fruits and vegetables, whole grains, and low-fat (lean) proteins.  Limit foods that are high in fat and processed sugars, such as fried and sweet foods.  Drink enough fluid to keep your urine clear or pale yellow. General instructions   To prevent or treat constipation while you are taking prescription pain medicine, your health care provider may recommend that you: ? Take over-the-counter or prescription medicines. ? Eat foods that are high in fiber, such as beans, fresh fruits and vegetables, and whole grains.  Do not use any products that contain nicotine or tobacco, such as cigarettes and e-cigarettes. If you need help quitting, ask your health care provider.  Avoid secondhand smoke.  Wear compression stockings as told by your health care provider. These stockings help to prevent blood clots and reduce swelling in your legs.  If you have a chest tube, care for it as instructed by your health care provider. Do not travel by airplane during the 2 weeks after your chest tube is removed, or until your health care provider says that this is safe.  Keep all follow-up visits as told by  your health care provider. This is important. Contact a health care provider if:  You have redness, swelling, or pain around an incision.  You have fluid or blood coming from an incision.  Your incision area feels warm to the touch.  You have pus or a bad smell coming from an incision.  You have a fever or chills.  You have nausea or vomiting.  You have pain that does not get better with medicine. Get help right away if:  You have chest pain.  Your heart is fluttering or beating rapidly.  You develop a rash.  You have shortness of breath or trouble breathing.  You are confused.  You have trouble speaking.  You feel weak, light-headed, or dizzy.  You faint. Summary  To help prevent lung infection (pneumonia), take deep breaths or do breathing exercises as instructed by your health care provider.  Cough frequently to clear mucus (phlegm) and expand your lungs. If it hurts to cough, hold a pillow against your chest or place the palms of both hands on top of  the incision (use splinting) when you cough.  If you have pain, take pain-relieving medicine before your pain becomes severe. This is important because if your pain is under control, you will be able to breathe and cough more comfortably.  Ask your health care provider what activities are safe for you. This information is not intended to replace advice given to you by your health care provider. Make sure you discuss any questions you have with your health care provider. Document Revised: 03/09/2017 Document Reviewed: 03/06/2016 Elsevier Patient Education  2020 [employer].

## 2019-12-09 NOTE — Progress Notes (Signed)
Patient refused CPAP Hs tonight. Patient has home mask but it doesn't fit on our equipment. He will bring missing pieces tomorrow. Our masks were tried and he didn't like either one. He wants to wait until tomorrow.

## 2019-12-10 ENCOUNTER — Inpatient Hospital Stay (HOSPITAL_COMMUNITY): Payer: Medicare HMO

## 2019-12-10 LAB — CBC
HCT: 33.8 % — ABNORMAL LOW (ref 39.0–52.0)
Hemoglobin: 11.2 g/dL — ABNORMAL LOW (ref 13.0–17.0)
MCH: 32.4 pg (ref 26.0–34.0)
MCHC: 33.1 g/dL (ref 30.0–36.0)
MCV: 97.7 fL (ref 80.0–100.0)
Platelets: 102 10*3/uL — ABNORMAL LOW (ref 150–400)
RBC: 3.46 MIL/uL — ABNORMAL LOW (ref 4.22–5.81)
RDW: 13.3 % (ref 11.5–15.5)
WBC: 6.4 10*3/uL (ref 4.0–10.5)
nRBC: 0 % (ref 0.0–0.2)

## 2019-12-10 LAB — COMPREHENSIVE METABOLIC PANEL
ALT: 125 U/L — ABNORMAL HIGH (ref 0–44)
AST: 98 U/L — ABNORMAL HIGH (ref 15–41)
Albumin: 3 g/dL — ABNORMAL LOW (ref 3.5–5.0)
Alkaline Phosphatase: 42 U/L (ref 38–126)
Anion gap: 8 (ref 5–15)
BUN: 10 mg/dL (ref 8–23)
CO2: 27 mmol/L (ref 22–32)
Calcium: 8.1 mg/dL — ABNORMAL LOW (ref 8.9–10.3)
Chloride: 103 mmol/L (ref 98–111)
Creatinine, Ser: 1.06 mg/dL (ref 0.61–1.24)
GFR calc Af Amer: >60 mL/min (ref >60)
GFR calc non Af Amer: >60 mL/min (ref >60)
Glucose, Bld: 98 mg/dL (ref 70–99)
Potassium: 4.1 mmol/L (ref 3.5–5.1)
Sodium: 138 mmol/L (ref 135–145)
Total Bilirubin: 2.1 mg/dL — ABNORMAL HIGH (ref 0.3–1.2)
Total Protein: 5.4 g/dL — ABNORMAL LOW (ref 6.5–8.1)

## 2019-12-10 LAB — SURGICAL PATHOLOGY

## 2019-12-10 LAB — PROTIME-INR
INR: 1.2 (ref 0.8–1.2)
Prothrombin Time: 14.6 seconds (ref 11.4–15.2)

## 2019-12-10 MED ORDER — LOPERAMIDE HCL 2 MG PO CAPS
2.0000 mg | ORAL_CAPSULE | ORAL | Status: DC | PRN
  Administered 2019-12-10: 2 mg via ORAL
  Filled 2019-12-10: qty 1

## 2019-12-10 MED ORDER — WARFARIN SODIUM 7.5 MG PO TABS
7.5000 mg | ORAL_TABLET | Freq: Every day | ORAL | Status: DC
  Administered 2019-12-10: 7.5 mg via ORAL
  Filled 2019-12-10: qty 1

## 2019-12-10 NOTE — Progress Notes (Addendum)
      [address]       [place] [address]             [phone]      2 Days Post-Op Procedure(s) (LRB): XI ROBOTIC ASSISTED THORASCOPY-RIGHT UPPER LOBE WEDGE RESECTION (Right) XI ROBOTIC ASSISTED THORASCOPY-RIGHT UPPER LOBECTOMY (Right) INTERCOSTAL NERVE BLOCK (Right) NODE DISSECTION (Right) Subjective: Feels okay this morning. Patient and wife are worried about care when he gets home since they live farther out from the hospital.   Objective: Vital signs in last 24 hours: Temp:  [97.7 F (36.5 C)-98 F (36.7 C)] 97.9 F (36.6 C) (09/01 0259) Pulse Rate:  [52-76] 76 (09/01 0300) Cardiac Rhythm: Atrial flutter (09/01 0700) Resp:  [15-20] 18 (09/01 0300) BP: (97-112)/(60-72) 112/72 (09/01 0300) SpO2:  [94 %-96 %] 94 % (09/01 0300)     Intake/Output from previous day: 08/31 0701 - 09/01 0700 In: -  Out: 190 [Chest Tube:190] Intake/Output this shift: No intake/output data recorded.  General appearance: alert, cooperative and no distress Heart:irregularly irregular. No murmur Abdomen: soft, non-tender; bowel sounds normal; no masses,  no organomegaly Extremities: extremities normal, atraumatic, no cyanosis or edema Wound: clean and dry  Lab Results: Recent Labs    12/09/19 0101 12/10/19 0114  WBC 7.8 6.4  HGB 11.1* 11.2*  HCT 33.6* 33.8*  PLT 111* 102*   BMET:  Recent Labs    12/09/19 0101 12/10/19 0114  NA 138 138  K 3.9 4.1  CL 106 103  CO2 23 27  GLUCOSE 130* 98  BUN 10 10  CREATININE 0.81 1.06  CALCIUM 8.0* 8.1*    PT/INR:  Recent Labs    12/10/19 0114  LABPROT 14.6  INR 1.2   ABG    Component Value Date/Time   PHART 7.452 (H) 12/04/2019 1308   HCO3 24.6 12/04/2019 1308   TCO2 27 08/22/2009 1222   ACIDBASEDEF 5.0 (H) 05/23/2007 0000   O2SAT 97.8 12/04/2019 1308   CBG (last 3)  No results for input(s): GLUCAP in the last 72 hours.  Assessment/Plan: S/P Procedure(s) (LRB): XI ROBOTIC ASSISTED THORASCOPY-RIGHT  UPPER LOBE WEDGE RESECTION (Right) XI ROBOTIC ASSISTED THORASCOPY-RIGHT UPPER LOBECTOMY (Right) INTERCOSTAL NERVE BLOCK (Right) NODE DISSECTION (Right)  1. CV-BP well controlled, NSR in the 60s-70s 2. Pulm- CXR with right apical pneumo, on room air with good oxygen saturation. Chest tube with 160cc/since surgery. + air leak with cough.  3. Renal-creatinine 1.06, electrolytes okay 4. H and H stable 11.2/33.8 5. Pain control: tramadol, tylenol, and toradol 6. Coumadin started with INR of 1.2, on lovenox bridge. He was on 10mg  at home daily and 15mg  on Monday. Will increase to 7.5mg  today.   Plan: He is doing really well. He was walking independently yesterday around the unit. CXR with right apical pneumo. Discussed with Dr. Kipp Brood and will continue patient on water seal. Continue coumadin for afib and mechanical aortic valve. Remains subtherapeutic on Lovenox bridge.     LOS: 2 days    Elgie Collard 12/10/2019   Leak slowly improving. Chest x-ray is unchanged but given that he is not ambulating much due to diarrhea will place him on -10 mm of suction. Awaiting therapeutic INR.  Hennie Gosa Bary Leriche

## 2019-12-10 NOTE — Plan of Care (Signed)
  Problem: Education: Goal: Knowledge of disease or condition will improve Outcome: Progressing   Problem: Cardiac: Goal: Will achieve and/or maintain hemodynamic stability Outcome: Progressing   Problem: Activity: Goal: Risk for activity intolerance will decrease Outcome: Progressing   Problem: Clinical Measurements: Goal: Postoperative complications will be avoided or minimized Outcome: Progressing   Problem: Respiratory: Goal: Respiratory status will improve Outcome: Progressing

## 2019-12-11 ENCOUNTER — Inpatient Hospital Stay (HOSPITAL_COMMUNITY): Payer: Medicare HMO

## 2019-12-11 LAB — CBC
HCT: 31.9 % — ABNORMAL LOW (ref 39.0–52.0)
Hemoglobin: 10.5 g/dL — ABNORMAL LOW (ref 13.0–17.0)
MCH: 31.8 pg (ref 26.0–34.0)
MCHC: 32.9 g/dL (ref 30.0–36.0)
MCV: 96.7 fL (ref 80.0–100.0)
Platelets: 116 10*3/uL — ABNORMAL LOW (ref 150–400)
RBC: 3.3 MIL/uL — ABNORMAL LOW (ref 4.22–5.81)
RDW: 13.3 % (ref 11.5–15.5)
WBC: 5.6 10*3/uL (ref 4.0–10.5)
nRBC: 0 % (ref 0.0–0.2)

## 2019-12-11 LAB — PROTIME-INR
INR: 1.1 (ref 0.8–1.2)
Prothrombin Time: 13.8 seconds (ref 11.4–15.2)

## 2019-12-11 MED ORDER — WARFARIN SODIUM 10 MG PO TABS
10.0000 mg | ORAL_TABLET | Freq: Every day | ORAL | Status: DC
  Administered 2019-12-11 – 2019-12-13 (×3): 10 mg via ORAL
  Filled 2019-12-11 (×3): qty 1

## 2019-12-11 NOTE — Progress Notes (Signed)
      [address]       [place] [address]             [phone]      3 Days Post-Op Procedure(s) (LRB): XI ROBOTIC ASSISTED THORASCOPY-RIGHT UPPER LOBE WEDGE RESECTION (Right) XI ROBOTIC ASSISTED THORASCOPY-RIGHT UPPER LOBECTOMY (Right) INTERCOSTAL NERVE BLOCK (Right) NODE DISSECTION (Right) Subjective: Multiple bouts of diarrhea yesterday and last night. Otherwise, doing well.   Objective: Vital signs in last 24 hours: Temp:  [97.6 F (36.4 C)-98.4 F (36.9 C)] 97.9 F (36.6 C) (09/02 1114) Pulse Rate:  [70-89] 82 (09/02 1114) Cardiac Rhythm: Atrial flutter (09/02 0910) Resp:  [14-20] 20 (09/02 1114) BP: (105-125)/(67-85) 106/73 (09/02 1114) SpO2:  [93 %-98 %] 97 % (09/02 1114)     Intake/Output from previous day: 09/01 0701 - 09/02 0700 In: 720 [P.O.:720] Out: 140 [Chest Tube:140] Intake/Output this shift: Total I/O In: 240 [P.O.:240] Out: -   General appearance: alert, cooperative and no distress Heart: regular rate and rhythm, S1, S2 normal, no murmur, click, rub or gallop Lungs: clear to auscultation bilaterally and diminished at the upper right lobe, all other fields with good breath sounds Abdomen: soft, non-tender; bowel sounds normal; no masses,  no organomegaly Extremities: extremities normal, atraumatic, no cyanosis or edema Wound: clean and dry  Lab Results: Recent Labs    12/10/19 0114 12/11/19 0135  WBC 6.4 5.6  HGB 11.2* 10.5*  HCT 33.8* 31.9*  PLT 102* 116*   BMET:  Recent Labs    12/09/19 0101 12/10/19 0114  NA 138 138  K 3.9 4.1  CL 106 103  CO2 23 27  GLUCOSE 130* 98  BUN 10 10  CREATININE 0.81 1.06  CALCIUM 8.0* 8.1*    PT/INR:  Recent Labs    12/11/19 0135  LABPROT 13.8  INR 1.1   ABG    Component Value Date/Time   PHART 7.452 (H) 12/04/2019 1308   HCO3 24.6 12/04/2019 1308   TCO2 27 08/22/2009 1222   ACIDBASEDEF 5.0 (H) 05/23/2007 0000   O2SAT 97.8 12/04/2019 1308   CBG (last 3)  No  results for input(s): GLUCAP in the last 72 hours.  Assessment/Plan: S/P Procedure(s) (LRB): XI ROBOTIC ASSISTED THORASCOPY-RIGHT UPPER LOBE WEDGE RESECTION (Right) XI ROBOTIC ASSISTED THORASCOPY-RIGHT UPPER LOBECTOMY (Right) INTERCOSTAL NERVE BLOCK (Right) NODE DISSECTION (Right)  1. CV-BP well controlled, NSR/rate-controlled afib 2. Pulm- CXR stable with a 15% pneumo which is unchanged, + air leak with cough. Will leave on WS 3. Renal-creatinine 1.06, electrolytes okay 4. H and H stable 10.5/31.9 5. Pain control: tramadol, tylenol, and toradol 6. Coumadin started with INR of 1.1, on lovenox bridge. He was on 10mg  at home daily and 15mg  on Monday. Will increase to 10mg  today.  7. Multiple bouts of diarrhea-on imodium as needed. Stop stool softeners  Plan: Continue chest tube and change to 80mm of suction. Continue to titrate coumadin for bump in INR.    LOS: 3 days    Elgie Collard 12/11/2019

## 2019-12-11 NOTE — Plan of Care (Signed)
  Problem: Education: Goal: Knowledge of disease or condition will improve Outcome: Progressing Goal: Knowledge of the prescribed therapeutic regimen will improve Outcome: Progressing   Problem: Activity: Goal: Risk for activity intolerance will decrease Outcome: Progressing   Problem: Cardiac: Goal: Will achieve and/or maintain hemodynamic stability Outcome: Progressing   Problem: Clinical Measurements: Goal: Postoperative complications will be avoided or minimized Outcome: Progressing   Problem: Respiratory: Goal: Respiratory status will improve Outcome: Progressing   Problem: Pain Management: Goal: Pain level will decrease Outcome: Progressing   Problem: Skin Integrity: Goal: Wound healing without signs and symptoms infection will improve Outcome: Progressing

## 2019-12-11 NOTE — Procedures (Signed)
Patient declined CPAP for tonight.  

## 2019-12-12 ENCOUNTER — Inpatient Hospital Stay (HOSPITAL_COMMUNITY): Payer: Medicare HMO

## 2019-12-12 LAB — PROTIME-INR
INR: 1.3 — ABNORMAL HIGH (ref 0.8–1.2)
Prothrombin Time: 15.9 seconds — ABNORMAL HIGH (ref 11.4–15.2)

## 2019-12-12 NOTE — Progress Notes (Signed)
RT note. Pt. Refused CPAP tonight. Pt. Resting comfortable. RT will continue to monitor

## 2019-12-12 NOTE — Progress Notes (Signed)
Patient refused use of CPAP for the evening 

## 2019-12-12 NOTE — Progress Notes (Addendum)
      [address]       ,[place] [address]             [phone]      4 Days Post-Op Procedure(s) (LRB): XI ROBOTIC ASSISTED THORASCOPY-RIGHT UPPER LOBE WEDGE RESECTION (Right) XI ROBOTIC ASSISTED THORASCOPY-RIGHT UPPER LOBECTOMY (Right) INTERCOSTAL NERVE BLOCK (Right) NODE DISSECTION (Right) Subjective: Feels good this morning. He is a little concerned about his bloody drainage the tube.   Objective: Vital signs in last 24 hours: Temp:  [97.8 F (36.6 C)-98.5 F (36.9 C)] 97.8 F (36.6 C) (09/03 0335) Pulse Rate:  [62-86] 80 (09/03 0335) Cardiac Rhythm: Atrial flutter (09/03 0435) Resp:  [16-20] 20 (09/03 0335) BP: (105-112)/(68-83) 108/83 (09/03 0335) SpO2:  [95 %-98 %] 97 % (09/03 0335)     Intake/Output from previous day: 09/02 0701 - 09/03 0700 In: 720 [P.O.:720] Out: 560 [Urine:400; Chest Tube:160] Intake/Output this shift: No intake/output data recorded.  General appearance: alert, cooperative and no distress Heart: irregularly irregular rhythm Lungs: clear to auscultation bilaterally Abdomen: soft, non-tender; bowel sounds normal; no masses,  no organomegaly Extremities: extremities normal, atraumatic, no cyanosis or edema Wound: hematoma above the chest tube site  Lab Results: Recent Labs    12/10/19 0114 12/11/19 0135  WBC 6.4 5.6  HGB 11.2* 10.5*  HCT 33.8* 31.9*  PLT 102* 116*   BMET:  Recent Labs    12/10/19 0114  NA 138  K 4.1  CL 103  CO2 27  GLUCOSE 98  BUN 10  CREATININE 1.06  CALCIUM 8.1*    PT/INR:  Recent Labs    12/12/19 0121  LABPROT 15.9*  INR 1.3*   ABG    Component Value Date/Time   PHART 7.452 (H) 12/04/2019 1308   HCO3 24.6 12/04/2019 1308   TCO2 27 08/22/2009 1222   ACIDBASEDEF 5.0 (H) 05/23/2007 0000   O2SAT 97.8 12/04/2019 1308   CBG (last 3)  No results for input(s): GLUCAP in the last 72 hours.  Assessment/Plan: S/P Procedure(s) (LRB): XI ROBOTIC ASSISTED THORASCOPY-RIGHT UPPER  LOBE WEDGE RESECTION (Right) XI ROBOTIC ASSISTED THORASCOPY-RIGHT UPPER LOBECTOMY (Right) INTERCOSTAL NERVE BLOCK (Right) NODE DISSECTION (Right)  1. CXR yesterday showed: Similar moderate right pneumothorax with similar positioning of a right sided chest tube. On 10 cm of suction 2. CV- continue asa, statin, and coumadin for afib/mech valve. INR 1.3. Will continue with 10mg  tonight. Continue lovenox bridge.  3. Renal-creatinine1.06, electrolytes okay 4. H and H stable 10.5/31.9 5. Pain control: tramadol, tylenol, and toradol  Plan: Keep to suction. Continue coumadin and lovenox however, with hematoma may need to consider something different. Will discuss with Dr. Kipp Brood   LOS: 4 days    Elgie Collard 12/12/2019  Agree with above. Diarrhea improved, patient denies any pleuritic chest pain. Small leak on exam, but chest x-ray shows the lung is better expanded.  There is still a small apical space. Bruising around the right lateral chest wall.  Hemoglobin is stable. Patient will be aggressive with ambulation today thus chest tube placed back to waterseal.  Chest tube to waterseal today.  Continue pulmonary toilet Awaiting therapeutic INR  Peder Allums [place]

## 2019-12-12 NOTE — Progress Notes (Addendum)
Patient's chest tube dressing on right was removed by PA for dr lightfoot to assess, patient's dressing continues to be saturated with blood due to hemotoma at that site, dr lightfoot has come to room and states site is ok, I have redressed the site due to continued bleeding, reinforced with ABD pad, 4x4 gauze and paper tape, dr lightfoot advised

## 2019-12-13 ENCOUNTER — Inpatient Hospital Stay (HOSPITAL_COMMUNITY): Payer: Medicare HMO

## 2019-12-13 LAB — PROTIME-INR
INR: 1.7 — ABNORMAL HIGH (ref 0.8–1.2)
Prothrombin Time: 19 seconds — ABNORMAL HIGH (ref 11.4–15.2)

## 2019-12-13 NOTE — Progress Notes (Signed)
     [address]       [place] [address]             [phone]       POD 5 Doing well Vitals:   12/13/19 0420 12/13/19 0925  BP: 121/88 109/75  Pulse: 78 75  Resp: 17 18  Temp: 98.3 F (36.8 C) 97.9 F (36.6 C)  SpO2: 93%    Alert NAD Sinus EWOB Small 1 column leak on CT  CXR stable INR 1.7  POD 5 s/p RATS RULectomy Will continue to monitor hematoma Will transition to [place] Awaiting therapeutic INR  [place]

## 2019-12-13 NOTE — Plan of Care (Signed)
  Problem: Education: Goal: Knowledge of disease or condition will improve Outcome: Progressing   Problem: Education: Goal: Knowledge of the prescribed therapeutic regimen will improve Outcome: Progressing   Problem: Activity: Goal: Risk for activity intolerance will decrease Outcome: Progressing   Problem: Cardiac: Goal: Will achieve and/or maintain hemodynamic stability Outcome: Progressing   Problem: Clinical Measurements: Goal: Postoperative complications will be avoided or minimized Outcome: Progressing   Problem: Skin Integrity: Goal: Wound healing without signs and symptoms infection will improve Outcome: Progressing   Problem: Education: Goal: Knowledge of General Education information will improve Description: Including pain rating scale, medication(s)/side effects and non-pharmacologic comfort measures Outcome: Progressing   Problem: Health Behavior/Discharge Planning: Goal: Ability to manage health-related needs will improve Outcome: Progressing   Problem: Clinical Measurements: Goal: Ability to maintain clinical measurements within normal limits will improve Outcome: Progressing   Problem: Clinical Measurements: Goal: Diagnostic test results will improve Outcome: Progressing   Problem: Nutrition: Goal: Adequate nutrition will be maintained Outcome: Progressing   Problem: Pain Managment: Goal: General experience of comfort will improve Outcome: Progressing   Problem: Skin Integrity: Goal: Risk for impaired skin integrity will decrease Outcome: Progressing

## 2019-12-14 ENCOUNTER — Inpatient Hospital Stay (HOSPITAL_COMMUNITY): Payer: Medicare HMO

## 2019-12-14 LAB — CBC
HCT: 27.7 % — ABNORMAL LOW (ref 39.0–52.0)
Hemoglobin: 9.1 g/dL — ABNORMAL LOW (ref 13.0–17.0)
MCH: 32.2 pg (ref 26.0–34.0)
MCHC: 32.9 g/dL (ref 30.0–36.0)
MCV: 97.9 fL (ref 80.0–100.0)
Platelets: 137 10*3/uL — ABNORMAL LOW (ref 150–400)
RBC: 2.83 MIL/uL — ABNORMAL LOW (ref 4.22–5.81)
RDW: 13.5 % (ref 11.5–15.5)
WBC: 6.1 10*3/uL (ref 4.0–10.5)
nRBC: 0 % (ref 0.0–0.2)

## 2019-12-14 LAB — PROTIME-INR
INR: 1.8 — ABNORMAL HIGH (ref 0.8–1.2)
Prothrombin Time: 19.9 seconds — ABNORMAL HIGH (ref 11.4–15.2)

## 2019-12-14 MED ORDER — WARFARIN SODIUM 5 MG PO TABS
5.0000 mg | ORAL_TABLET | Freq: Every day | ORAL | Status: DC
  Administered 2019-12-14 – 2019-12-15 (×2): 5 mg via ORAL
  Filled 2019-12-14 (×2): qty 1

## 2019-12-14 NOTE — Progress Notes (Addendum)
[address]       [place] [address]             [phone]      6 Days Post-Op Procedure(s) (LRB): XI ROBOTIC ASSISTED THORASCOPY-RIGHT UPPER LOBE WEDGE RESECTION (Right) XI ROBOTIC ASSISTED THORASCOPY-RIGHT UPPER LOBECTOMY (Right) INTERCOSTAL NERVE BLOCK (Right) NODE DISSECTION (Right) Subjective: Feels pretty well   Objective: Vital signs in last 24 hours: Temp:  [97.5 F (36.4 C)-98.8 F (37.1 C)] 98.8 F (37.1 C) (09/05 0428) Pulse Rate:  [67-88] 78 (09/05 0428) Cardiac Rhythm: Normal sinus rhythm (09/05 0428) Resp:  [16-19] 18 (09/05 0428) BP: (116-133)/(67-71) 128/69 (09/05 0428) SpO2:  [96 %-98 %] 98 % (09/05 0428)  Hemodynamic parameters for last 24 hours:    Intake/Output from previous day: 09/04 0701 - 09/05 0700 In: -  Out: 5462 [Urine:1725; Chest Tube:120] Intake/Output this shift: No intake/output data recorded.  General appearance: alert, cooperative and no distress Heart: irregularly irregular rhythm Lungs: min dim in right base Abdomen: benign Extremities: no edema Wound: healing well , extensive echymosis with some hematoma appears to be pretty stable  Lab Results: Recent Labs    12/14/19 0554  WBC 6.1  HGB 9.1*  HCT 27.7*  PLT 137*   BMET: No results for input(s): NA, K, CL, CO2, GLUCOSE, BUN, CREATININE, CALCIUM in the last 72 hours.  PT/INR:  Recent Labs    12/14/19 0554  LABPROT 19.9*  INR 1.8*   ABG    Component Value Date/Time   PHART 7.452 (H) 12/04/2019 1308   HCO3 24.6 12/04/2019 1308   TCO2 27 08/22/2009 1222   ACIDBASEDEF 5.0 (H) 05/23/2007 0000   O2SAT 97.8 12/04/2019 1308   CBG (last 3)  No results for input(s): GLUCAP in the last 72 hours.  Meds Scheduled Meds: . aspirin EC  81 mg Oral Daily  . atorvastatin  80 mg Oral QPM  . bisacodyl  10 mg Oral Daily  . enoxaparin (LOVENOX) injection  80 mg Subcutaneous Q12H  . loratadine  10 mg Oral Daily  . senna-docusate  1 tablet Oral QHS    . warfarin  10 mg Oral q1600  . Warfarin - Physician Dosing Inpatient   Does not apply q1600   Continuous Infusions: . sodium chloride     PRN Meds:.Place/Maintain arterial line **AND** sodium chloride, loperamide, ondansetron (ZOFRAN) IV, traMADol  Xrays DG Chest Port 1 View  Result Date: 12/13/2019 CLINICAL DATA:  Pneumothorax. EXAM: PORTABLE CHEST 1 VIEW COMPARISON:  December 12, 2019. FINDINGS: Status post cardiac valve repair. Right-sided chest tube is unchanged in position. Stable minimal right apical pneumothorax is noted minimal bibasilar subsegmental atelectasis is noted. Bony thorax is unremarkable. IMPRESSION: Stable minimal right apical pneumothorax. Right-sided chest tube is unchanged in position. Minimal bibasilar subsegmental atelectasis. Electronically Signed   By: Marijo Conception M.D.   On: 12/13/2019 08:49    Assessment/Plan: S/P Procedure(s) (LRB): XI ROBOTIC ASSISTED THORASCOPY-RIGHT UPPER LOBE WEDGE RESECTION (Right) XI ROBOTIC ASSISTED THORASCOPY-RIGHT UPPER LOBECTOMY (Right) INTERCOSTAL NERVE BLOCK (Right) NODE DISSECTION (Right)  1 doing well overall, + air leak on H2O seal- CXR is pending, leave in place, may need mini-express at discharge 2 afeb, VSS, sinus rhythm with PAC's 3 H/H is lower at 9.1/27.7, difficult situation with hematoma and mechanical valve. INR is 1.8- cont coumadin, consider stop lovenox when gets to  2.0. thrombocytopenia is improved- will reduce to 5 mg coumadin today so we dont overshoot too quickly 4  recheck labs in am  LOS: 6 days    John Giovanni PA-C Pager [phone] 12/14/2019   Agree with above [name]

## 2019-12-14 NOTE — Plan of Care (Signed)

## 2019-12-14 NOTE — Plan of Care (Signed)
  Problem: Education: Goal: Knowledge of disease or condition will improve Outcome: Progressing   Problem: Education: Goal: Knowledge of the prescribed therapeutic regimen will improve Outcome: Progressing   Problem: Activity: Goal: Risk for activity intolerance will decrease Outcome: Progressing   Problem: Cardiac: Goal: Will achieve and/or maintain hemodynamic stability Outcome: Progressing   Problem: Clinical Measurements: Goal: Postoperative complications will be avoided or minimized Outcome: Progressing   Problem: Pain Management: Goal: Pain level will decrease Outcome: Progressing   Problem: Skin Integrity: Goal: Wound healing without signs and symptoms infection will improve Outcome: Progressing   Problem: Education: Goal: Knowledge of General Education information will improve Description: Including pain rating scale, medication(s)/side effects and non-pharmacologic comfort measures Outcome: Progressing   Problem: Clinical Measurements: Goal: Ability to maintain clinical measurements within normal limits will improve Outcome: Progressing   Problem: Clinical Measurements: Goal: Diagnostic test results will improve Outcome: Progressing   Problem: Activity: Goal: Risk for activity intolerance will decrease Outcome: Progressing   Problem: Nutrition: Goal: Adequate nutrition will be maintained Outcome: Progressing   Problem: Pain Managment: Goal: General experience of comfort will improve Outcome: Progressing   Problem: Skin Integrity: Goal: Risk for impaired skin integrity will decrease Outcome: Progressing

## 2019-12-15 ENCOUNTER — Inpatient Hospital Stay (HOSPITAL_COMMUNITY): Payer: Medicare HMO

## 2019-12-15 LAB — BASIC METABOLIC PANEL
Anion gap: 6 (ref 5–15)
BUN: 12 mg/dL (ref 8–23)
CO2: 27 mmol/L (ref 22–32)
Calcium: 7.9 mg/dL — ABNORMAL LOW (ref 8.9–10.3)
Chloride: 104 mmol/L (ref 98–111)
Creatinine, Ser: 0.74 mg/dL (ref 0.61–1.24)
GFR calc Af Amer: >60 mL/min (ref >60)
GFR calc non Af Amer: >60 mL/min (ref >60)
Glucose, Bld: 108 mg/dL — ABNORMAL HIGH (ref 70–99)
Potassium: 4.4 mmol/L (ref 3.5–5.1)
Sodium: 137 mmol/L (ref 135–145)

## 2019-12-15 LAB — PROTIME-INR
INR: 2 — ABNORMAL HIGH (ref 0.8–1.2)
Prothrombin Time: 21.9 seconds — ABNORMAL HIGH (ref 11.4–15.2)

## 2019-12-15 LAB — CBC
HCT: 26.1 % — ABNORMAL LOW (ref 39.0–52.0)
Hemoglobin: 8.6 g/dL — ABNORMAL LOW (ref 13.0–17.0)
MCH: 32.2 pg (ref 26.0–34.0)
MCHC: 33 g/dL (ref 30.0–36.0)
MCV: 97.8 fL (ref 80.0–100.0)
Platelets: 150 K/uL (ref 150–400)
RBC: 2.67 MIL/uL — ABNORMAL LOW (ref 4.22–5.81)
RDW: 13.7 % (ref 11.5–15.5)
WBC: 5.5 K/uL (ref 4.0–10.5)
nRBC: 0 % (ref 0.0–0.2)

## 2019-12-15 NOTE — Progress Notes (Signed)
     [address]       [place] [address]             [phone]       Doing well Vitals:   12/14/19 2345 12/15/19 0335  BP: 135/78 132/81  Pulse: 79 66  Resp: 15 18  Temp: 98 F (36.7 C) 98.4 F (36.9 C)  SpO2: 98% 98%    Alert NAD EWOB Small leak with cough  POD 7 s/p RATS, RULectomy Will clamp chest tube today Will likely remove CT today INR therapeutic  Michaeal Davis O Georgi Tuel

## 2019-12-15 NOTE — Discharge Summary (Signed)
Physician Discharge Summary  Patient ID: Marcus [name] MRN: [id number] DOB/AGE: [birth year] 74 y.o.  Admit date: 12/08/2019 Discharge date: 12/18/2019  Admission Diagnoses: Right lower lobe pulmonary nodule  Discharge Diagnoses:  Active Problems:   Right lower lobe pulmonary nodule  Patient Active Problem List   Diagnosis Date Noted  . Right lower lobe pulmonary nodule 12/08/2019  . Abdominal hernia 06/16/2019  . COPD (chronic obstructive pulmonary disease) ([place]) 06/16/2019  . Abnormal CT of the chest 04/21/2019  . Postoperative atrial fibrillation ([place]) 12/22/2012  . OSA (obstructive sleep apnea) 11/01/2012  . Bradycardia, severe sinus 09/09/2012  . CAD (coronary artery disease) 09/09/2012  . S/P thoracic aortic aneurysm repair 09/09/2012  . Hx of CABG 09/09/2012  . Hyperlipidemia 09/09/2012  . HTN, goal below 130/80 09/09/2012  . Long term current use of anticoagulant therapy 06/28/2012  . H/O aortic valve replacement 06/28/2012    History of Present Illness:at time of office consultation Marcus [place]74 y.o.male74 year old male referred by Dr. Lamonte Sakai for surgical evaluation of 3.9 cm right upper lobe pulmonary mass. This was originally found on cross-sectional imaging back in July 2020. On repeat imaging visits around 2.5 cm adenoma PET scan from January 2021 had an SUV max 2.4. He is a former smoker who quit in 1970s. He is asymptomatic from a respiratory standpoint. He denies any weight changes or any neurologic symptoms. In 2009 he underwent aortic valve replacement with mechanical valve by Dr. Cyndia Bent. He has had no issues since then. He is somewhat hesitant to discuss surgical options.  A PET scan was obtained which did show a persistent lesion with activity.  Stress test showed low risk disease.  Patient agreed to proceed and was admitted this hospitalization electively for the procedure.  Discharged Condition: good  Hospital Course: Patient was admitted electively and on  12/08/2019 taken to the operating room where he underwent the below described procedure.  He tolerated it well and was taken to the postanesthesia care unit in stable condition.  Postoperative hospital course:  Patient has done well postoperatively.  He has maintained stable hemodynamics.  Pain has been under good control using usual measures.  He does have a mild expected acute blood loss anemia which is stable.  Renal function has remained within normal limits.  Coumadin has been resumed for his mechanical valve.  A Lovenox bridge was also used.  He did develop some early diarrhea but this is resolved.  He has developed a hematoma in the region of the incision as well as significant ecchymosis which is stabilized.  There does not appear to be any signs of associated infection.  Most recent hemoglobin hematocrit dated 12/15/2019 are 8.6 and 26.1 respectively.  He did have a postoperative thrombocytopenia but this has resolved.  Most recent INR on 12/18/2019 is 2.1.  Patient has mechanical AVR with goal INR range of 2.0-3.0.  Blood sugars have been under good control and he is not a diabetic.  During the postoperative period he has a small air leak which is showing steady improvement.  On 12/15/2019 the chest tube has been clamped.  CXR showed slight increase in pneumothorax.  His chest tube remained in place.  His air leak resolved on 12/16/2019.  Follow up CXR showed stable appearance of pneumothorax.  His chest tube was again clamped with no change in pneumothorax.  His chest tube was removed on 12/17/2019.  He had some episodic PAF, which patient's states is chronic for him.  He will follow up with  Cardiology as scheduled.  He is felt medically stable for discharge home today.  Consults: None  Significant Diagnostic Studies: routine serial post-op labs and CXR's  Treatments: surgery:  12/08/2019  Patient:  Marny Lowenstein Pre-Op Dx: Right upper lobe pulmonary mass Post-op Dx: Right upper lobe  adenocarcinoma Procedure: - Robotic assisted right video thoracoscopy -Right upper lobe wedge resection -Right upper completion lobectomy - Mediastinal lymph node sampling - Intercostal nerve block  Surgeon and Role:      * Lightfoot, Lucile Crater, MD - Primary    *D. Ezekiel Slocumb- assisting   SURGICAL PATHOLOGY  CASE: [phone]  PATIENT: Marcus [name]  Surgical Pathology Report   Clinical History: Right upper lobe lung mass (ms)   FINAL MICROSCOPIC DIAGNOSIS:   A. LUNG, RIGHT UPPER LOBE WEDGE, RESECTION:  - Adenocarcinoma, 4.8 cm.  - Adenocarcinoma extends to staple line.  - Adenocarcinoma involves subpleural connective tissue.  - See oncology table and comment.   B. LUNG, RIGHT UPPER LOBE, RESECTION:  - Adenocarcinoma, 1.5 cm.  - Margins not involved.  - Adenocarcinoma involves subpleural connective tissue.  - One anthracotic lymph node with no metastatic carcinoma identified.  - See oncology table and comment   C. LYMPH NODE, 8, BIOPSY:  - Anthracotic lymph node with no metastatic carcinoma identified.   D. LYMPH NODE, 10R, BIOPSY:  - Anthracotic lymph node with no metastatic carcinoma identified.   E. LYMPH NODE, HILAR #1, BIOPSY:  - Metastatic carcinoma involving anthracotic lymph node.   F. LYMPH NODE, HILAR #2, BIOPSY:  - Metastatic carcinoma involving anthracotic lymph node.   G. LYMPH NODE, HILAR #3, BIOPSY:  - Metastatic carcinoma involving anthracotic lymph node.   H. LYMPH NODE, HILAR #4, BIOPSY:  - Metastatic carcinoma involving anthracotic lymph node.   I. LYMPH NODE, 4R #1, BIOPSY:  - Benign connective tissue.  - No lymph node tissue or carcinoma.   J. LYMPH NODE, 4R #2, BIOPSY:  - Anthracotic lymph node with no metastatic carcinoma identified.   K. LYMPH NODE, 4R #3, BIOPSY:  - Metastatic carcinoma involving anthracotic lymph node.   L. LYMPH NODE, HILAR #5, BIOPSY:  - Blood clot.  - No lymph node tissue or carcinoma.   ONCOLOGY  TABLE:  LUNG: Resection  Procedure: Right upper lobe wedge biopsy, right upper lobectomy and  lymph node biopsies.  Specimen Laterality: Right upper lung lobe.  Tumor Site: Right upper lung lobe.  Tumor Size: 6.3 cm, see comment.  Tumor Focality: Unifocal.  Histologic Type: Adenocarcinoma with papillary and acinar features.  Visceral Pleura Invasion: Carcinoma involves subpleural connective  tissue.  Lymphovascular Invasion: Present.  Margins: Final margins not involved by carcinoma.  Treatment Effect: No known presurgical therapy.  Regional Lymph Nodes:    Number of Lymph Nodes Involved: 5    Number of Lymph Nodes Examined: 9  Pathologic Stage Classification (pTNM, AJCC 8th Edition): pT3, pN2  Ancillary Studies: Can be performed.  Representative Tumor Block: A2, A3, A4 and A5.  Comment(s): The carcinoma in the right upper lobe wedge (part A) is 4.8  cm and the carcinoma in the right upper lobectomy (part B) is 1.5 cm for  greatest tumor dimension of 6.3 cm.  ([id number])   INTRAOPERATIVE DIAGNOSIS:   A1. LUNG, RIGHT UPPER LOBE MASS, WEDGE RESECTION, FROZEN SECTION:    Adenocarcinoma.    Rapid intraoperative consult diagnosis rendered by Dr. Melina Copa @  [phone] 12/08/2019.   B1. LUNG, RIGHT UPPER LOBE, BRONCHIAL MARGIN, FROZEN SECTION:  Uninvolved by carcinoma.    Rapid intraoperative consult diagnosis rendered by Dr. Melina Copa @  1146 12/08/2019.    GROSS DESCRIPTION:  Specimen A: Received fresh for rapid intraoperative consult by frozen  section is a 33 g, 10.2 x 4.5 x 3.6 cm wedge of lung, clinically right  upper lobe. There is a staple line on one aspect. The pleura is  pink-gray to red-purple, smooth, with scattered anthracosis. Attached  at one area of staple line is a suture, clinically identifying the mass.  On sectioning through this area, there is a 4.8 x 3.4 x 2.7 cm pink-gray  to red firm to friable ill-defined mass which is involved with the   staple line. The mass does not grossly involve overlying pleura.  Block summary:  Block 1 = section of mass submitted for frozen section  Blocks 2, 3 = sections of mass for routine histology  Blocks 4, 5 = sections of mass with overlying pleura, for routine  histology   Specimen B: Lung, right upper lobectomy, suture on bronchus, received  fresh for rapid intraoperative consult on bronchial margin by frozen  section.  Specimen integrity: There are 2 staple lines across pleura, 1 on lateral  aspect from apical to anterior and the other on medial aspect at hilum.  Size, weight: 190 g, 11 x 11 x 4.5 cm  Pleura: Pink-red to red-purple, wrinkled over apical segment. On  sectioning through the pleura at apex, there are underlying focally  dilated subpleural airspaces consistent with emphysematous change.  Lesion: No residual lesion identified.  Margin(s): Bronchial margin is patent and grossly free of lesions.  Hilar vessels: Unremarkable  Nonneoplastic parenchyma: Pink-red to dark red spongy parenchyma with  scattered f.  Lymph nodes: Found at the hilum is a focus of soft anthracotic vaguely  nodular tissue, possibly lymph node.  Block Summary:  Block 1 = shave of bronchial margin submitted for frozen section.  Blocks 2, 3 representative sections of tissue along staple lines  Block 4 = sections at apex with possible emphysematous change  Block 5 = 1 possible node at hilum  Block 6 = vascular margins   Specimen C: Received fresh labeled level 8 node is a 1 x 0.3x 0.2 cm  yellow-pink to anthracotic soft tissue, in toto 1 block.   Specimen D: Received fresh labeled level 10 R node #1 is a 0.9 x 0.6 x  0.2 cm yellow-pink to anthracotic soft tissue, in toto 1 block.   Specimen E: Received fresh labeled hilar node #1 is a 0.7 x 0.3 x 0.25  cm soft anthracotic tissue, in toto 1 block.   Specimen F: Received fresh labeled hilar node #2 is a 0.5 x 0.4 x 0.2 cm  soft anthracotic tissue,  in toto 1 block.   Specimen G: Received fresh labeled hilar node #3 are few pieces of  pink-red to anthracotic soft tissue, 0.3 x 0.2 x 0.1 cm in aggregate,  submitted 1 block.   Specimen H: Received fresh labeled hilar node #4 is a 0.8 x 0.5 x 0.2 cm  aggregate of anthracotic soft tissue, submitted in 1 block.   Specimen I: Received fresh labeled 4 R node #1 is a 1 x 0.8 x 0.2 cm  aggregate of yellow-pink soft tissue, submitted in 1 block.   Specimen J: Received fresh and labeled 4 R node #2 is a 1 x 1 x 0.3 cm  aggregate of yellow-pink to anthracotic soft tissue, submitted in 1  block.  Specimen K: Received fresh labeled 4 R node #3 is a 0.5 x 0.5 x 0.2 cm  yellow-red soft tissue, in toto 1 block.   Specimen L: Received fresh labeled hilar node #5 is a 0.5 x 0.5 x 0.2 cm  aggregate of dark red soft tissue/material, submitted 1 block.  SW 12/08/2019   Final Diagnosis performed by Claudette Laws, MD.  Electronically signed  12/10/2019  Technical component performed at [facility]. [facility], [place] [address] [place], [place] [address].  Professional component performed at [facility],  [place] [address], [place], [place] [address].  Immunohistochemistry Technical component (if applicable) was performed  at [facility]. [address] [place],  [place], [place] [address].  IMMUNOHISTOCHEMISTRY DISCLAIMER (if applicable):  Some of these immunohistochemical stains may have been developed and the  performance characteristics determine by [facility]. Some  may not have been cleared or approved by the U.S. Food and Drug  Administration. The FDA has determined that such clearance or approval  is not necessary. This test is used for clinical purposes. It should not  be regarded as investigational or for research. This laboratory is  certified under the [facility]  (CLIA-88) as qualified to  perform high complexity clinical laboratory  testing. The controls stained appropriately.  Discharge Exam: Blood pressure 129/67, pulse (!) 169, temperature 98.4 F (36.9 C), temperature source Oral, resp. rate 12, height 5\' 11"  (1.803 m), weight 82.2 kg, SpO2 (!) 81 %.  General appearance: alert, cooperative and no distress Heart: regular rate and rhythm Lungs: clear to auscultation bilaterally Abdomen: soft, non-tender; bowel sounds normal; no masses,  no organomegaly Extremities: extremities normal, atraumatic, no cyanosis or edema Wound: clean and dry, ecchymosis right flank   Discharge disposition: 01-Home or Self Care   Allergies as of 12/18/2019      Reactions   Amlodipine Anaphylaxis   Weakness and fatigue   Tetanus Toxoids Anaphylaxis   Crestor [rosuvastatin]    Myalgia    Penicillins Hives   Has patient had a PCN reaction causing immediate rash, facial/tongue/throat swelling, SOB or lightheadedness with hypotension: No Has patient had a PCN reaction causing severe rash involving mucus membranes or skin necrosis: Yes Has patient had a PCN reaction that required hospitalization: No Has patient had a PCN reaction occurring within the last 10 years: No If all of the above answers are "NO", then may proceed with Cephalosporin use.      Medication List    STOP taking these medications   enoxaparin 80 MG/0.8ML injection Commonly known as: LOVENOX     TAKE these medications   acetaminophen 325 MG tablet Commonly known as: TYLENOL Take 2 tablets (650 mg total) by mouth every 6 (six) hours as needed for mild pain.   aspirin EC 81 MG tablet Take 81 mg by mouth daily.   atorvastatin 80 MG tablet Commonly known as: LIPITOR TAKE 1 TABLET BY MOUTH EVERY DAY What changed: when to take this   CALCIUM 600+D3 PO Take 1 tablet by mouth daily.   carboxymethylcellulose 0.5 % Soln Commonly known as: REFRESH PLUS Place 1 drop into both eyes daily as needed (dry eyes).    CINNAMON PO Take 1,000 mg by mouth every evening.   clindamycin 300 MG capsule Commonly known as: CLEOCIN Take 2 tablets (600 mg total) 30-60 minutes prior to dental or medical procedure. What changed:   how much to take  how to  take this  when to take this   COQ10 PO Take 1 capsule by mouth daily.   EYE SUPPORT PO Take 1 tablet by mouth daily.   fexofenadine 180 MG tablet Commonly known as: ALLEGRA Take 180 mg by mouth every evening.   Krill Oil 350 MG Caps Take 350 mg by mouth every evening.   NONFORMULARY OR COMPOUNDED ITEM Apply 2 sprays topically as directed. Testosterone Topical  Preparation 100 mg (Compounded by [facility])  Apply 2 clicks (50 mg) daily   ramipril 10 MG capsule Commonly known as: ALTACE TAKE 1 CAPSULE BY MOUTH EVERY DAY What changed: how much to take   Spiriva Respimat 2.5 MCG/ACT Aers Generic drug: Tiotropium Bromide Monohydrate Inhale 1 puff into the lungs daily.   traMADol 50 MG tablet Commonly known as: ULTRAM Take 1 tablet (50 mg total) by mouth every 6 (six) hours as needed (mild pain).   warfarin 10 MG tablet Commonly known as: COUMADIN Take as directed. If you are unsure how to take this medication, talk to your nurse or doctor. Original instructions: TAKE 1 TO 1 AND 1/2 TABLETS DAILY AS DIRECTED BY COUMADIN CLINIC What changed: See the new instructions.       Follow-up Information    Norberto Sorenson, MD. Call in 1 day(s).   Specialty: Internal Medicine Contact information: [place] [address] [phone]               Signed: Original note by Jadene Pierini PA-C   Updated by:  Ellwood Handler, PA-C 12/18/2019, 7:29 AM

## 2019-12-15 NOTE — Plan of Care (Signed)
  Problem: Education: Goal: Knowledge of disease or condition will improve Outcome: Progressing   Problem: Education: Goal: Knowledge of the prescribed therapeutic regimen will improve Outcome: Progressing   Problem: Activity: Goal: Risk for activity intolerance will decrease Outcome: Progressing   Problem: Clinical Measurements: Goal: Postoperative complications will be avoided or minimized Outcome: Progressing   Problem: Pain Management: Goal: Pain level will decrease Outcome: Progressing   Problem: Skin Integrity: Goal: Wound healing without signs and symptoms infection will improve Outcome: Progressing   Problem: Education: Goal: Knowledge of General Education information will improve Description: Including pain rating scale, medication(s)/side effects and non-pharmacologic comfort measures Outcome: Progressing   Problem: Health Behavior/Discharge Planning: Goal: Ability to manage health-related needs will improve Outcome: Progressing   Problem: Clinical Measurements: Goal: Diagnostic test results will improve Outcome: Progressing   Problem: Nutrition: Goal: Adequate nutrition will be maintained Outcome: Progressing   Problem: Pain Managment: Goal: General experience of comfort will improve Outcome: Progressing   Problem: Skin Integrity: Goal: Risk for impaired skin integrity will decrease Outcome: Progressing

## 2019-12-16 ENCOUNTER — Inpatient Hospital Stay (HOSPITAL_COMMUNITY): Payer: Medicare HMO

## 2019-12-16 LAB — PROTIME-INR
INR: 1.9 — ABNORMAL HIGH (ref 0.8–1.2)
Prothrombin Time: 20.7 seconds — ABNORMAL HIGH (ref 11.4–15.2)

## 2019-12-16 MED ORDER — ALPRAZOLAM 0.25 MG PO TABS
0.2500 mg | ORAL_TABLET | Freq: Three times a day (TID) | ORAL | Status: DC | PRN
  Administered 2019-12-16 – 2019-12-18 (×2): 0.25 mg via ORAL
  Filled 2019-12-16 (×2): qty 1

## 2019-12-16 MED ORDER — WARFARIN SODIUM 10 MG PO TABS
10.0000 mg | ORAL_TABLET | Freq: Every day | ORAL | Status: DC
  Administered 2019-12-16 – 2019-12-17 (×2): 10 mg via ORAL
  Filled 2019-12-16 (×2): qty 1

## 2019-12-16 MED ORDER — KETOROLAC TROMETHAMINE 30 MG/ML IJ SOLN
15.0000 mg | Freq: Four times a day (QID) | INTRAMUSCULAR | Status: DC
  Administered 2019-12-16 (×2): 15 mg via INTRAVENOUS
  Filled 2019-12-16 (×2): qty 1

## 2019-12-16 MED ORDER — TRAMADOL HCL 50 MG PO TABS
50.0000 mg | ORAL_TABLET | Freq: Four times a day (QID) | ORAL | 0 refills | Status: DC | PRN
Start: 2019-12-16 — End: 2019-12-26

## 2019-12-16 MED FILL — traMADol HCL 50 MG TABS: 50 | 7 days supply | Qty: 30 | Fill #0

## 2019-12-16 NOTE — Progress Notes (Addendum)
      [address]       [place] [address]             [phone]      8 Days Post-Op Procedure(s) (LRB): XI ROBOTIC ASSISTED THORASCOPY-RIGHT UPPER LOBE WEDGE RESECTION (Right) XI ROBOTIC ASSISTED THORASCOPY-RIGHT UPPER LOBECTOMY (Right) INTERCOSTAL NERVE BLOCK (Right) NODE DISSECTION (Right)   Subjective:  Up in chair eating breakfast.  No specific complaints.  His wife is concerned about his anxiety level and thinks he needs a medication to help with this.  Objective: Vital signs in last 24 hours: Temp:  [98.1 F (36.7 C)-98.7 F (37.1 C)] 98.1 F (36.7 C) (09/07 0324) Pulse Rate:  [77-85] 77 (09/07 0324) Cardiac Rhythm: Normal sinus rhythm (09/07 0324) Resp:  [19-20] 19 (09/07 0324) BP: (122-133)/(51-79) 127/79 (09/07 0324) SpO2:  [98 %-100 %] 100 % (09/07 0324)  Intake/Output from previous day: 09/06 0701 - 09/07 0700 In: -  Out: 1535 [Urine:1075; Chest Tube:460]  General appearance: alert, cooperative and no distress Heart: regular rate and rhythm Lungs: clear to auscultation bilaterally Abdomen: soft, non-tender; bowel sounds normal; no masses,  no organomegaly Extremities: extremities normal, atraumatic, no cyanosis or edema Wound: clean and dry, ecchymosis of right side/flank  Lab Results: Recent Labs    12/14/19 0554 12/15/19 0158  WBC 6.1 5.5  HGB 9.1* 8.6*  HCT 27.7* 26.1*  PLT 137* 150   BMET:  Recent Labs    12/15/19 0158  NA 137  K 4.4  CL 104  CO2 27  GLUCOSE 108*  BUN 12  CREATININE 0.74  CALCIUM 7.9*    PT/INR:  Recent Labs    12/16/19 0139  LABPROT 20.7*  INR 1.9*   ABG    Component Value Date/Time   PHART 7.452 (H) 12/04/2019 1308   HCO3 24.6 12/04/2019 1308   TCO2 27 08/22/2009 1222   ACIDBASEDEF 5.0 (H) 05/23/2007 0000   O2SAT 97.8 12/04/2019 1308   CBG (last 3)  No results for input(s): GLUCAP in the last 72 hours.  Assessment/Plan: S/P Procedure(s) (LRB): XI ROBOTIC ASSISTED  THORASCOPY-RIGHT UPPER LOBE WEDGE RESECTION (Right) XI ROBOTIC ASSISTED THORASCOPY-RIGHT UPPER LOBECTOMY (Right) INTERCOSTAL NERVE BLOCK (Right) NODE DISSECTION (Right)  1. CV- hemodynamically stable, S/P Mechanical AVR in the past... INR goal is 2-2.5, however patient's wife states they were told 2.5-3.5 2. INR 1.9, patient takes 10 mg daily at home, 15 mg on Monday.. he has only been given 5 mg the past 2 days, will restart coumadin at 10 mg daily 3. Pulm- CT, no air leak present this morning, some tidaling present... no CXR ordered, will obtain... if CXR is stable, possibly d/c chest tube today vs. Tomorrow, will defer to Dr. Kipp Brood 4. Dispo- patient stable, no air leak present, will get CXR this morning if stable could possibly d/c chest tube today, continue coumadin, patient is stable, possible d/c later today vs tomorrow depending chest tube management   LOS: 8 days    Ellwood Handler, PA-C  12/16/2019  We will keep to waterseal for now. We will perform another test clamp tomorrow. Patient states that he does not want to go home with a chest tube and would prefer to stay in house until it is removed. Continue Coumadin as INR is 1.9  Marcus [name] O Marcus [name]

## 2019-12-16 NOTE — Plan of Care (Signed)

## 2019-12-17 ENCOUNTER — Inpatient Hospital Stay (HOSPITAL_COMMUNITY): Payer: Medicare HMO

## 2019-12-17 LAB — CBC
HCT: 28.9 % — ABNORMAL LOW (ref 39.0–52.0)
Hemoglobin: 9.4 g/dL — ABNORMAL LOW (ref 13.0–17.0)
MCH: 32.1 pg (ref 26.0–34.0)
MCHC: 32.5 g/dL (ref 30.0–36.0)
MCV: 98.6 fL (ref 80.0–100.0)
Platelets: 244 10*3/uL (ref 150–400)
RBC: 2.93 MIL/uL — ABNORMAL LOW (ref 4.22–5.81)
RDW: 13.8 % (ref 11.5–15.5)
WBC: 6.6 10*3/uL (ref 4.0–10.5)
nRBC: 0 % (ref 0.0–0.2)

## 2019-12-17 LAB — PROTIME-INR
INR: 1.7 — ABNORMAL HIGH (ref 0.8–1.2)
Prothrombin Time: 19.3 seconds — ABNORMAL HIGH (ref 11.4–15.2)

## 2019-12-17 MED ORDER — KETOROLAC TROMETHAMINE 15 MG/ML IJ SOLN
15.0000 mg | Freq: Three times a day (TID) | INTRAMUSCULAR | Status: DC | PRN
  Administered 2019-12-17: 15 mg via INTRAVENOUS
  Filled 2019-12-17: qty 1

## 2019-12-17 MED ORDER — ACETAMINOPHEN 325 MG PO TABS: 650.0000 mg | ORAL_TABLET | Freq: Four times a day (QID) | ORAL | Status: DC | PRN

## 2019-12-17 NOTE — Progress Notes (Addendum)
      [address]       [place] [address]             [phone]      9 Days Post-Op Procedure(s) (LRB): XI ROBOTIC ASSISTED THORASCOPY-RIGHT UPPER LOBE WEDGE RESECTION (Right) XI ROBOTIC ASSISTED THORASCOPY-RIGHT UPPER LOBECTOMY (Right) INTERCOSTAL NERVE BLOCK (Right) NODE DISSECTION (Right)   Subjective:  Up in chair, no new complaints.  Doesn't wish to go home with chest tube.  Objective: Vital signs in last 24 hours: Temp:  [97.6 F (36.4 C)-98.6 F (37 C)] 98.6 F (37 C) (09/08 0359) Pulse Rate:  [67-92] 70 (09/07 1900) Cardiac Rhythm: Normal sinus rhythm (09/08 0708) Resp:  [13-22] 13 (09/08 0359) BP: (105-122)/(64-90) 118/64 (09/08 0359) SpO2:  [92 %-100 %] 98 % (09/07 1900)  Intake/Output from previous day: 09/07 0701 - 09/08 0700 In: 960 [P.O.:960] Out: 700 [Urine:450; Chest Tube:250]  General appearance: alert, cooperative and no distress Heart: regular rate and rhythm Lungs: clear to auscultation bilaterally Abdomen: soft, non-tender; bowel sounds normal; no masses,  no organomegaly Wound: clean and dry  Lab Results: Recent Labs    12/15/19 0158  WBC 5.5  HGB 8.6*  HCT 26.1*  PLT 150   BMET:  Recent Labs    12/15/19 0158  NA 137  K 4.4  CL 104  CO2 27  GLUCOSE 108*  BUN 12  CREATININE 0.74  CALCIUM 7.9*    PT/INR:  Recent Labs    12/16/19 0139  LABPROT 20.7*  INR 1.9*   ABG    Component Value Date/Time   PHART 7.452 (H) 12/04/2019 1308   HCO3 24.6 12/04/2019 1308   TCO2 27 08/22/2009 1222   ACIDBASEDEF 5.0 (H) 05/23/2007 0000   O2SAT 97.8 12/04/2019 1308   CBG (last 3)  No results for input(s): GLUCAP in the last 72 hours.  Assessment/Plan: S/P Procedure(s) (LRB): XI ROBOTIC ASSISTED THORASCOPY-RIGHT UPPER LOBE WEDGE RESECTION (Right) XI ROBOTIC ASSISTED THORASCOPY-RIGHT UPPER LOBECTOMY (Right) INTERCOSTAL NERVE BLOCK (Right) NODE DISSECTION (Right)  1. CV- hemodynamically stable, S/P AVR  mechanical 2. INR 1.9 yesterday, no phlebotomist to draw labs overnight, will need to get PT/INR result prior to dosing coumadin 3. Pulm- no air leak present, mild tidaling, new sub q emphysema mild, CXR shows slight increase in pneumothorax in apex, basilar is stable... possibly try to clamp chest tube today 4. Dispo- patient stable, need to get labs drawn this morning to assess INR, will adjust coumadin dosing as needed, however patient's home dose was 10 mg with 15 mg on Monday, no air leak present, some tidaling, pneumothorax slightly increased, will defer CT management to Dr. Kipp Brood   LOS: 9 days   Ellwood Handler, PA-C 12/17/2019  Agree with above. We will clamp chest tube today If stable will remove chest tube.  Alicia Ackert Bary Leriche

## 2019-12-18 ENCOUNTER — Other Ambulatory Visit: Payer: Self-pay | Admitting: *Deleted

## 2019-12-18 ENCOUNTER — Inpatient Hospital Stay (HOSPITAL_COMMUNITY): Payer: Medicare HMO

## 2019-12-18 LAB — PROTIME-INR
INR: 2.1 — ABNORMAL HIGH (ref 0.8–1.2)
Prothrombin Time: 23.1 seconds — ABNORMAL HIGH (ref 11.4–15.2)

## 2019-12-18 MED ORDER — ACETAMINOPHEN 325 MG PO TABS: 650.0000 mg | ORAL_TABLET | Freq: Four times a day (QID) | ORAL | Status: DC | PRN

## 2019-12-18 MED ORDER — GADOBUTROL 1 MMOL/ML IV SOLN
8.0000 mL | Freq: Once | INTRAVENOUS | Status: AC | PRN
  Administered 2019-12-18: 8 mL via INTRAVENOUS

## 2019-12-18 NOTE — Progress Notes (Signed)
      [address]       [place] [address]             [phone]      10 Days Post-Op Procedure(s) (LRB): XI ROBOTIC ASSISTED THORASCOPY-RIGHT UPPER LOBE WEDGE RESECTION (Right) XI ROBOTIC ASSISTED THORASCOPY-RIGHT UPPER LOBECTOMY (Right) INTERCOSTAL NERVE BLOCK (Right) NODE DISSECTION (Right)   Subjective:  No new complaints.  Having some A. Fib overnight, which patient has a history of.    Objective: Vital signs in last 24 hours: Temp:  [98 F (36.7 C)-99.1 F (37.3 C)] 98.4 F (36.9 C) (09/09 0409) Pulse Rate:  [70-169] 169 (09/09 0019) Cardiac Rhythm: Atrial fibrillation (09/09 0706) Resp:  [12-21] 12 (09/09 0409) BP: (115-132)/(67-75) 129/67 (09/09 0409) SpO2:  [81 %-98 %] 81 % (09/09 0019)  Intake/Output from previous day: 09/08 0701 - 09/09 0700 In: 120 [P.O.:120] Out: 50 [Chest Tube:50]  General appearance: alert, cooperative and no distress Heart: regular rate and rhythm Lungs: clear to auscultation bilaterally Abdomen: soft, non-tender; bowel sounds normal; no masses,  no organomegaly Extremities: extremities normal, atraumatic, no cyanosis or edema Wound: clean and dry, ecchymosis right flank  Lab Results: Recent Labs    12/17/19 0740  WBC 6.6  HGB 9.4*  HCT 28.9*  PLT 244   BMET: No results for input(s): NA, K, CL, CO2, GLUCOSE, BUN, CREATININE, CALCIUM in the last 72 hours.  PT/INR:  Recent Labs    12/18/19 0036  LABPROT 23.1*  INR 2.1*   ABG    Component Value Date/Time   PHART 7.452 (H) 12/04/2019 1308   HCO3 24.6 12/04/2019 1308   TCO2 27 08/22/2009 1222   ACIDBASEDEF 5.0 (H) 05/23/2007 0000   O2SAT 97.8 12/04/2019 1308   CBG (last 3)  No results for input(s): GLUCAP in the last 72 hours.  Assessment/Plan: S/P Procedure(s) (LRB): XI ROBOTIC ASSISTED THORASCOPY-RIGHT UPPER LOBE WEDGE RESECTION (Right) XI ROBOTIC ASSISTED THORASCOPY-RIGHT UPPER LOBECTOMY (Right) INTERCOSTAL NERVE BLOCK (Right) NODE  DISSECTION (Right)  1. PAF, currently rate controlled in the 80s- patient not on BB due to Bradycardia.Marland Kitchen will arrange follow up with his Cardiologist 2. INR 2.1, continue coumadin at home regimen 3. Pulm- no acute issues, slight increase in pneumothorax, could also be apical space 4. dispo- patient stable, rate controlled A. Fib, patient has long standing history of PAF, INR is therapeutic at 2.1 resume home regimen of coumadin, will d/c home today   LOS: 10 days    Ellwood Handler, PA-C  12/18/2019

## 2019-12-18 NOTE — Progress Notes (Signed)
The proposed treatment discussed in cancer conference is for discussion purpose only and is not a binding recommendation. The patient was not physically examined nor present for their treatment options. Therefore, final treatment plans cannot be decided.  ?

## 2019-12-19 ENCOUNTER — Ambulatory Visit: Payer: Medicare HMO | Admitting: Thoracic Surgery (Cardiothoracic Vascular Surgery)

## 2019-12-22 ENCOUNTER — Ambulatory Visit (INDEPENDENT_AMBULATORY_CARE_PROVIDER_SITE_OTHER): Payer: Medicare HMO | Admitting: Cardiology

## 2019-12-22 DIAGNOSIS — Z7901 Long term (current) use of anticoagulants: Secondary | ICD-10-CM | POA: Diagnosis not present

## 2019-12-22 DIAGNOSIS — Z952 Presence of prosthetic heart valve: Secondary | ICD-10-CM | POA: Diagnosis not present

## 2019-12-22 LAB — POCT INR: INR: 3.2 — AB (ref 2.0–3.0)

## 2019-12-23 ENCOUNTER — Telehealth: Payer: Self-pay | Admitting: Internal Medicine

## 2019-12-23 NOTE — Telephone Encounter (Signed)
Patients wife called and stated

## 2019-12-24 ENCOUNTER — Other Ambulatory Visit: Payer: Self-pay | Admitting: *Deleted

## 2019-12-24 ENCOUNTER — Telehealth: Payer: Self-pay | Admitting: *Deleted

## 2019-12-24 ENCOUNTER — Encounter: Payer: Self-pay | Admitting: *Deleted

## 2019-12-24 DIAGNOSIS — R911 Solitary pulmonary nodule: Secondary | ICD-10-CM

## 2019-12-24 NOTE — Telephone Encounter (Signed)
I received a call from patient's wife.  She was inquiring about her husbands appt. I called Marcus [name] about his appt and he requested me to call his wife. I called Marcus [name] and updated on appt time and place.

## 2019-12-24 NOTE — Patient Outreach (Signed)
[facility]) Care Management  12/24/2019  Marcus [name] 06/01/1945 [id number]   [facility] EMMI-general discharge referral  On APL  RED ON EMMI ALERT Day # 4 Date: Tuesday 12/23/19 1001 patient Red Alert Reason: Lost interest in things?Yes Other questions/problems?Yes  Insurance: Bernadene Person [facility] admissions x  1 ED visits x 1  in the last 6 months  Last admission 12/08/19 -12/18/19 right lower lobe pulmonary nodule -Right upper lobe adenocarcinoma post robotic assisted with video thoracoscopy, wedge resection, lobectomy, lymph node sampling intercostal nerve block (Dr h Kipp Brood)  Outreach attempt # 1 Patient is able to verify HIPAA, DOB and address [place] [facility] RN reviewed and addressed red alert with patient He reports at intervals issues with hearing, possible audio concerns [name] was noted in the background assisting him with audio and repeating some questions for him  EMMI:  [name] reported he did have questions but his wife was able to outreach to Dr Kipp Brood on 12/23/19 to get the answers needed He denies needing assistance at this time for lost of interest in things  He reports he has it "handled" He did agreed to one further [facility] outreach to follow up that he has no further care coordination needs    Consent: [facility] RN CM reviewed [facility] services with patient. Patient gave verbal consent for services [facility] telephonic RN CM.   Advised patient that there will be further automated EMMI- post discharge calls to assess how the patient is doing following the recent hospitalization Advised the patient that another call may be received from a nurse if any of their responses were abnormal. Patient voiced understanding and was appreciative of f/u call.   Plan: [facility] RN CM will follow up with [name] within 30 days to assess for any further care coordination needs prior to EMMI case closure  [facility] RN CM sent a successful outreach letter as discussed with [facility] brochure  enclosed for review Pt encouraged to return a call to [facility] RN CM prn   Lennell Shanks L. Lavina Hamman, RN, BSN, [place] Coordinator Office number [phone] Mobile number [phone]  Main [facility] number [phone] Fax number [phone]

## 2019-12-26 ENCOUNTER — Other Ambulatory Visit: Payer: Self-pay

## 2019-12-26 ENCOUNTER — Ambulatory Visit (INDEPENDENT_AMBULATORY_CARE_PROVIDER_SITE_OTHER): Payer: Self-pay | Admitting: Thoracic Surgery (Cardiothoracic Vascular Surgery)

## 2019-12-26 ENCOUNTER — Encounter: Payer: Self-pay | Admitting: Thoracic Surgery (Cardiothoracic Vascular Surgery)

## 2019-12-26 VITALS — BP 138/76 | HR 118 | Resp 20 | Ht 71.0 in | Wt 171.0 lb

## 2019-12-26 DIAGNOSIS — C3411 Malignant neoplasm of upper lobe, right bronchus or lung: Secondary | ICD-10-CM

## 2019-12-26 DIAGNOSIS — Z902 Acquired absence of lung [part of]: Secondary | ICD-10-CM

## 2019-12-26 MED ORDER — TRAMADOL HCL 50 MG PO TABS
50.0000 mg | ORAL_TABLET | Freq: Four times a day (QID) | ORAL | 0 refills | Status: DC | PRN
Start: 2019-12-26 — End: 2020-02-10

## 2019-12-26 NOTE — Progress Notes (Signed)
[address]       [name] [address]             [phone]        Marcus [name] [place] Medical Record #[id number] Date of Birth: Sep 10, 1945  Referring: Marcus Sine, MD Primary Care: Marcus Sorenson, MD Primary Cardiologist:Marcus [name], MD  Reason for visit:   follow-up  History of Present Illness:     Marcus [name] comes in for his first follow-up appointment.  Overall he is doing well.  He continues to have some numbness around his access incision, and a small amount of pain when he moves around.  Physical Exam: BP 138/76   Pulse (!) 118   Resp 20   Ht 5\' 11"  (1.803 m)   Wt 171 lb (77.6 kg)   BMI 23.85 kg/m   Alert NAD Incision clean, stitch removed.   Abdomen soft, ND No peripheral edema   Diagnostic Studies & Laboratory data:  Path: A. LUNG, RIGHT UPPER LOBE WEDGE, RESECTION:  - Adenocarcinoma, 4.8 cm.  - Adenocarcinoma extends to staple line.  - Adenocarcinoma involves subpleural connective tissue.  - See oncology table and comment.   B. LUNG, RIGHT UPPER LOBE, RESECTION:  - Adenocarcinoma, 1.5 cm.  - Margins not involved.  - Adenocarcinoma involves subpleural connective tissue.  - One anthracotic lymph node with no metastatic carcinoma identified.  - See oncology table and comment   C. LYMPH NODE, 8, BIOPSY:  - Anthracotic lymph node with no metastatic carcinoma identified.   D. LYMPH NODE, 10R, BIOPSY:  - Anthracotic lymph node with no metastatic carcinoma identified.   E. LYMPH NODE, HILAR #1, BIOPSY:  - Metastatic carcinoma involving anthracotic lymph node.   F. LYMPH NODE, HILAR #2, BIOPSY:  - Metastatic carcinoma involving anthracotic lymph node.   G. LYMPH NODE, HILAR #3, BIOPSY:  - Metastatic carcinoma involving anthracotic lymph node.   H. LYMPH NODE, HILAR #4, BIOPSY:  - Metastatic carcinoma involving anthracotic lymph node.   I. LYMPH NODE, 4R #1, BIOPSY:  - Benign connective tissue.  - No lymph node  tissue or carcinoma.   J. LYMPH NODE, 4R #2, BIOPSY:  - Anthracotic lymph node with no metastatic carcinoma identified.   K. LYMPH NODE, 4R #3, BIOPSY:  - Metastatic carcinoma involving anthracotic lymph node.   L. LYMPH NODE, HILAR #5, BIOPSY:  - Blood clot.  - No lymph node tissue or carcinoma.   ONCOLOGY TABLE:  LUNG: Resection  Procedure: Right upper lobe wedge biopsy, right upper lobectomy and  lymph node biopsies.  Specimen Laterality: Right upper lung lobe.  Tumor Site: Right upper lung lobe.  Tumor Size: 6.3 cm, see comment.  Tumor Focality: Unifocal.  Histologic Type: Adenocarcinoma with papillary and acinar features.  Visceral Pleura Invasion: Carcinoma involves subpleural connective  tissue.  Lymphovascular Invasion: Present.  Margins: Final margins not involved by carcinoma.  Treatment Effect: No known presurgical therapy.  Regional Lymph Nodes:    Number of Lymph Nodes Involved: 5    Number of Lymph Nodes Examined: 9  Pathologic Stage Classification (pTNM, AJCC 8th Edition): pT3, pN2     Assessment / Plan:   74 year old male with a T3 N2 M0 stage IIIb adenocarcinoma of the right upper lobe.  He is status post robotic assisted lobectomy.  His MRI brain was negative.  He is scheduled to meet with medical oncology on Wednesday.  I will see him back in 1 month with  post op chest x-ray.   Marcus [name] 12/26/2019 2:03 PM

## 2019-12-29 ENCOUNTER — Ambulatory Visit (INDEPENDENT_AMBULATORY_CARE_PROVIDER_SITE_OTHER): Payer: Medicare HMO | Admitting: Internal Medicine

## 2019-12-29 DIAGNOSIS — Z952 Presence of prosthetic heart valve: Secondary | ICD-10-CM | POA: Diagnosis not present

## 2019-12-29 DIAGNOSIS — Z7901 Long term (current) use of anticoagulants: Secondary | ICD-10-CM

## 2019-12-29 LAB — POCT INR: INR: 2.9 (ref 2.0–3.0)

## 2019-12-31 ENCOUNTER — Other Ambulatory Visit: Payer: Self-pay | Admitting: *Deleted

## 2019-12-31 ENCOUNTER — Other Ambulatory Visit: Payer: Self-pay

## 2019-12-31 ENCOUNTER — Encounter: Payer: Self-pay | Admitting: *Deleted

## 2019-12-31 DIAGNOSIS — Z952 Presence of prosthetic heart valve: Secondary | ICD-10-CM | POA: Insufficient documentation

## 2019-12-31 DIAGNOSIS — Z125 Encounter for screening for malignant neoplasm of prostate: Secondary | ICD-10-CM | POA: Insufficient documentation

## 2019-12-31 DIAGNOSIS — E559 Vitamin D deficiency, unspecified: Secondary | ICD-10-CM | POA: Insufficient documentation

## 2019-12-31 DIAGNOSIS — E291 Testicular hypofunction: Secondary | ICD-10-CM | POA: Insufficient documentation

## 2019-12-31 DIAGNOSIS — D696 Thrombocytopenia, unspecified: Secondary | ICD-10-CM | POA: Insufficient documentation

## 2019-12-31 DIAGNOSIS — E010 Iodine-deficiency related diffuse (endemic) goiter: Secondary | ICD-10-CM | POA: Insufficient documentation

## 2019-12-31 MED ORDER — ALPRAZOLAM 0.25 MG PO TABS: 0.2500 mg | ORAL_TABLET | Freq: Three times a day (TID) | ORAL | 0 refills | Status: DC | PRN

## 2019-12-31 NOTE — Patient Outreach (Signed)
[facility]) Care Management  12/31/2019  Marcus [name] 02-21-1946 [id number]   [facility] outreach follow up   Mr Marcus [name] was referred to [facility] for EMMI general for loss of interest/questions on 12/24/19 The EMMI issue were resolved on 12/24/19   Follow up  Mr Marcus [name] continues to report he is doing well  He does report somw hoarseness today but feels it is related to his oncology concerns (right lung nodule),  allergies and use of his CPAP He is for follow up with oncologist 01/01/20 He and [facility] RN CM discussed good nasal care with use of his CIPAP to include moist nares, saline and cleaing of DME regularly He and Mrs Marcus [name] confirm no further EMMI concerns or care coordination is needed at this time  Plans [facility] RN CM will close this case per pt agreement and no further identified needs Case closure letter sent to patient  Marcus Millin L. Lavina Hamman, RN, BSN, [place] Coordinator Office number [phone] Main [facility] number [phone] Fax number [phone]

## 2020-01-01 ENCOUNTER — Telehealth: Payer: Self-pay | Admitting: Internal Medicine

## 2020-01-01 ENCOUNTER — Inpatient Hospital Stay: Payer: Medicare HMO

## 2020-01-01 ENCOUNTER — Inpatient Hospital Stay: Payer: Medicare HMO | Attending: Internal Medicine | Admitting: Internal Medicine

## 2020-01-01 ENCOUNTER — Encounter: Payer: Self-pay | Admitting: Internal Medicine

## 2020-01-01 ENCOUNTER — Ambulatory Visit: Payer: Medicare HMO | Admitting: Physician Assistant

## 2020-01-01 ENCOUNTER — Encounter: Payer: Self-pay | Admitting: Physician Assistant

## 2020-01-01 ENCOUNTER — Other Ambulatory Visit: Payer: Self-pay

## 2020-01-01 VITALS — BP 105/64 | HR 52 | Temp 97.3°F | Resp 16 | Ht 71.0 in | Wt 170.4 lb

## 2020-01-01 VITALS — BP 130/64 | HR 55 | Ht 71.0 in | Wt 171.0 lb

## 2020-01-01 DIAGNOSIS — I25810 Atherosclerosis of coronary artery bypass graft(s) without angina pectoris: Secondary | ICD-10-CM

## 2020-01-01 DIAGNOSIS — I480 Paroxysmal atrial fibrillation: Secondary | ICD-10-CM | POA: Diagnosis not present

## 2020-01-01 DIAGNOSIS — I10 Essential (primary) hypertension: Secondary | ICD-10-CM

## 2020-01-01 DIAGNOSIS — R918 Other nonspecific abnormal finding of lung field: Secondary | ICD-10-CM

## 2020-01-01 DIAGNOSIS — Z87891 Personal history of nicotine dependence: Secondary | ICD-10-CM

## 2020-01-01 DIAGNOSIS — Z5111 Encounter for antineoplastic chemotherapy: Secondary | ICD-10-CM | POA: Insufficient documentation

## 2020-01-01 DIAGNOSIS — C3491 Malignant neoplasm of unspecified part of right bronchus or lung: Secondary | ICD-10-CM

## 2020-01-01 DIAGNOSIS — R634 Abnormal weight loss: Secondary | ICD-10-CM

## 2020-01-01 DIAGNOSIS — G4733 Obstructive sleep apnea (adult) (pediatric): Secondary | ICD-10-CM | POA: Diagnosis not present

## 2020-01-01 DIAGNOSIS — Z952 Presence of prosthetic heart valve: Secondary | ICD-10-CM

## 2020-01-01 DIAGNOSIS — Z801 Family history of malignant neoplasm of trachea, bronchus and lung: Secondary | ICD-10-CM | POA: Diagnosis not present

## 2020-01-01 DIAGNOSIS — C3411 Malignant neoplasm of upper lobe, right bronchus or lung: Secondary | ICD-10-CM | POA: Insufficient documentation

## 2020-01-01 DIAGNOSIS — Z902 Acquired absence of lung [part of]: Secondary | ICD-10-CM

## 2020-01-01 DIAGNOSIS — R911 Solitary pulmonary nodule: Secondary | ICD-10-CM

## 2020-01-01 LAB — CBC WITH DIFFERENTIAL (CANCER CENTER ONLY)
Abs Immature Granulocytes: 0.02 10*3/uL (ref 0.00–0.07)
Basophils Absolute: 0 10*3/uL (ref 0.0–0.1)
Basophils Relative: 1 %
Eosinophils Absolute: 0.2 10*3/uL (ref 0.0–0.5)
Eosinophils Relative: 3 %
HCT: 33.5 % — ABNORMAL LOW (ref 39.0–52.0)
Hemoglobin: 10.7 g/dL — ABNORMAL LOW (ref 13.0–17.0)
Immature Granulocytes: 0 %
Lymphocytes Relative: 21 %
Lymphs Abs: 1.3 10*3/uL (ref 0.7–4.0)
MCH: 31.3 pg (ref 26.0–34.0)
MCHC: 31.9 g/dL (ref 30.0–36.0)
MCV: 98 fL (ref 80.0–100.0)
Monocytes Absolute: 0.5 10*3/uL (ref 0.1–1.0)
Monocytes Relative: 8 %
Neutro Abs: 4.3 10*3/uL (ref 1.7–7.7)
Neutrophils Relative %: 67 %
Platelet Count: 243 10*3/uL (ref 150–400)
RBC: 3.42 MIL/uL — ABNORMAL LOW (ref 4.22–5.81)
RDW: 14.4 % (ref 11.5–15.5)
WBC Count: 6.4 10*3/uL (ref 4.0–10.5)
nRBC: 0 % (ref 0.0–0.2)

## 2020-01-01 LAB — CMP (CANCER CENTER ONLY)
ALT: 26 U/L (ref 0–44)
AST: 28 U/L (ref 15–41)
Albumin: 3.3 g/dL — ABNORMAL LOW (ref 3.5–5.0)
Alkaline Phosphatase: 100 U/L (ref 38–126)
Anion gap: 3 — ABNORMAL LOW (ref 5–15)
BUN: 13 mg/dL (ref 8–23)
CO2: 31 mmol/L (ref 22–32)
Calcium: 8.8 mg/dL — ABNORMAL LOW (ref 8.9–10.3)
Chloride: 106 mmol/L (ref 98–111)
Creatinine: 0.84 mg/dL (ref 0.61–1.24)
GFR, Est AFR Am: >60 mL/min (ref >60)
GFR, Estimated: >60 mL/min (ref >60)
Glucose, Bld: 81 mg/dL (ref 70–99)
Potassium: 4.3 mmol/L (ref 3.5–5.1)
Sodium: 140 mmol/L (ref 135–145)
Total Bilirubin: 1 mg/dL (ref 0.3–1.2)
Total Protein: 6.7 g/dL (ref 6.5–8.1)

## 2020-01-01 NOTE — Research (Signed)
[id number] (ALCHEMIST) GENETIC TESTING FOR PATIENTS WITH RESECTABLE or RESECTED LUNG CANCER  01/01/2020 14:00PM  REFERRAL/CONSENT: Met with Marcus [name] and his wife [name] in exam room 19 for 45 minutes following his provider visit to discuss voluntary participation in the [place] study. The main study consent form is reviewed, including the usual approach to treatment, alternatives to participation, the voluntary nature of participation, study purpose, how results affect subsequent treatment options, length of study, blood draw procedure and tissue request, questionnaires, potential risks and benefits, personal rights and responsibilities, study costs and study contact information. An opportunity to ask questions is provided and all questions are answered. The HIPAA consent form is also reviewed, including shared medical information, purpose of sharing, who will see the information, activities to keep information private, alternatives to signing, principal investigator information, length of authorization, and personal rights. Upon completion of review, Marcus [name] a desire to voluntarily participate in the ALCHEMIST study. Marcus [name] signs the main consent form (protocol version 12/17/2018, [place] active date 01/09/2019) at 14:24PM and he consents to optional banking. Marcus [name] also signs the HIPAA consent form (dated 11/25/12) himself. Copies of both signed forms are provided to the patient along with the [place] clinical research brochure and my business card with direct contact information. Marcus [name] and [name] are advised that there are specific criteria that determine study eligibility and that I will be reviewing the chart for eligibility. They are informed that should the patient not be eligible for participation, they will be notified immediately and both verbalize understanding. Marcus [name] and [name] are also advised that a pathology request for tissue submission will be sent to the pathology department and that blood  samples will be collected at the next visit. Both are advised that it can take up to three weeks to receive testing results and they verbalize understanding. Both Marcus [name] and [name] are encouraged to contact me directly for questions or concerns. Current plan is to verify study eligibility by this nurse along with a second research nurse and if eligible, request tissue specimen for submission. On-study questionnaires will be conducted once eligibility is confirmed.  Dionne Bucy. Sharlett Iles, BSN, RN, CIC 01/01/2020 3:20 PM  ELIGIBILITY: Patient is confirmed as eligible for participation by myself and clinical research nurse Yolande Jolly. Dr. Julien Nordmann notified that patient has consented to participation and is eligible. Tissue request faxed to [facility] pathology department.  Dionne Bucy. Sharlett Iles, BSN, RN, CIC 01/01/2020 4:20 PM

## 2020-01-01 NOTE — Patient Instructions (Signed)
Medication Instructions:  Your physician recommends that you continue on your current medications as directed. Please refer to the Current Medication list given to you today.  *If you need a refill on your cardiac medications before your next appointment, please call your pharmacy*  Lab Work: NONE ordered at this time of appointment   If you have labs (blood work) drawn today and your tests are completely normal, you will receive your results only by: Marland Kitchen MyChart Message (if you have MyChart) OR . A paper copy in the mail If you have any lab test that is abnormal or we need to change your treatment, we will call you to review the results.  Testing/Procedures: NONE ordered at this time of appointment   Follow-Up: At [facility], you and your health needs are our priority.  As part of our continuing mission to provide you with exceptional heart care, we have created designated Provider Care Teams.  These Care Teams include your primary Cardiologist (physician) and Advanced Practice Providers (APPs -  Physician Assistants and Nurse Practitioners) who all work together to provide you with the care you need, when you need it.  Your next appointment:   Follow up as scheduled   The format for your next appointment:   In Person  Provider:   [name], MD   Other Instructions

## 2020-01-01 NOTE — Telephone Encounter (Signed)
Scheduled appointments per 9/23 los. Spoke to patient who is aware of appointments. Gave patient calendar print out.

## 2020-01-01 NOTE — Progress Notes (Signed)
[place] [phone]   [phone]  CONSULT NOTE  REFERRING PHYSICIAN:Dr. Tera Mater Lightfoot  REASON FOR CONSULTATION:  74 years old white male recently diagnosed with lung cancer.  HPI Marcus [name] is a 74 y.o. male with past medical history significant for hypertension, dyslipidemia, sleep apnea, atrial fibrillation as well as history of smoking but quit in 1981.  The patient has a history of coronary artery disease and aortic aneurysm.  He had CT angiogram of the chest performed on October 29, 2018 that showed 2.5 cm pleural-based opacity medially in the right upper lobe previously measured 1.2 cm.  Neoplasm and atypical/indolent infectious process where the primary differential consideration.  The patient had a PET scan on April 30, 2019 and the focus of peripheral consolidation in the right upper lobe had mild metabolic activity favoring postinflammatory or infectious process.  On November 06, 2019 the patient had repeat CT angiogram of the chest and it showed enlargement of the right upper lobe anterior medial mixed groundglass/pleural-based opacity which measured 3.9 x 2.8 cm and abuts against the anterior mediastinum medially and the pleural service anteriorly.  There was also increasing size and number of adjacent subcentimeter groundglass and subsolid nodules.  These were suspicious for low-grade adenocarcinoma.  A PET scan was performed on 11/24/2019 and it showed increase in the size and degree of FDG uptake of the partially solid lesion within the anterior medial aspect of the right upper lobe suspicious for underlying neoplasm.  There was also surrounding multiple progressive nonsolid partially solid and solid nodules which are suspicious and may represent satellite lesions.  No FDG avid nodal metastasis or distant metastatic disease. The patient was referred to Dr. Kipp Brood and on December 08, 2019 he underwent robotic assisted right video thoracoscopy  with right upper lobectomy and lymph node sampling. The final pathology [phone]) adenocarcinoma measuring 4.8 cm with extension to the staple line and adenocarcinoma involving subpleural connective tissue.  There was another focus measuring 1.5 cm involving the subpleural connective tissue.  There was evidence of metastatic carcinoma involving hilar lymph node as well as 4R lymph nodes. The patient had MRI of the brain on 12/18/2019 that showed no evidence of metastatic disease to the brain. Dr. Kipp Brood kindly referred the patient to the multidisciplinary thoracic oncology clinic today for evaluation and recommendation regarding adjuvant therapy. When seen today he is feeling fine except for soreness in the back from the surgical scar but no significant chest pain.  He has shortness of breath with exertion and dry cough with no hemoptysis.  He lost around 8 pounds in the last 2 months.  He has no nausea, vomiting, diarrhea or constipation.  He has no headache or visual changes. Family history significant for father with lung cancer and mother had Alzheimer. The patient is married and has 2 children a son and daughter.  He was accompanied today by his wife [name].  The patient is currently retired and used to work as a Administrator.  He has a history of smoking 1 pack/day for around 25 years and quit in 1981.  He has a history of alcohol abuse in the past but not recently and no history of drug abuse.  HPI  Past Medical History:  Diagnosis Date  . Atrial fibrillation ([place])   . Dyspnea    with exertion   . Dysrhythmia   . Hypertension   . Obstructive sleep apnea 10/25/2007   cpap  . S/P AVR  2D ECHO, 04/25/2011 - EF >55%, Right ventricle-mild-moderately dilated  . Swelling of limb    LEA VENOUS, 08/22/2009 - no evidence of deep vein or superficial thrombosis; partially rupturing Baker's Cyst    Past Surgical History:  Procedure Laterality Date  . CARDIAC CATHETERIZATION Bilateral  05/10/2007   Significant 1-vessel disease, severely dilated aortic root with moderate severe aortic insufficiency  . CARDIAC SURGERY    . CARDIOVERSION  08/02/2007   150 Joule biphasic shock with restoration of sinus rhythm. Heart rate 60.  . cataract surgery     . COLONOSCOPY WITH PROPOFOL N/A 12/15/2016   Procedure: COLONOSCOPY WITH PROPOFOL;  Surgeon: Carol Ada, MD;  Location: [facility];  Service: Endoscopy;  Laterality: N/A;  . EYE SURGERY    . INTERCOSTAL NERVE BLOCK Right 12/08/2019   Procedure: INTERCOSTAL NERVE BLOCK;  Surgeon: [name], MD;  Location: [facility];  Service: Thoracic;  Laterality: Right;  . NM MYOVIEW LTD  04/08/2007   No evidence of inducible myocardial ischemia  . NODE DISSECTION Right 12/08/2019   Procedure: NODE DISSECTION;  Surgeon: [name], MD;  Location: [facility];  Service: Thoracic;  Laterality: Right;  . THYROIDECTOMY, PARTIAL    . torn meniscus in right knee surgery       Family History  Problem Relation Age of Onset  . Cancer Father     Social History Social History   Tobacco Use  . Smoking status: Former Smoker    Types: Cigarettes    Quit date: 04/11/1987    Years since quitting: 32.7  . Smokeless tobacco: Never Used  Vaping Use  . Vaping Use: Never used  Substance Use Topics  . Alcohol use: No  . Drug use: No    Allergies  Allergen Reactions  . Amlodipine Anaphylaxis    Weakness and fatigue   . Tetanus Toxoids Anaphylaxis  . Crestor [Rosuvastatin]     Myalgia   . Penicillins Hives    Has patient had a PCN reaction causing immediate rash, facial/tongue/throat swelling, SOB or lightheadedness with hypotension: No Has patient had a PCN reaction causing severe rash involving mucus membranes or skin necrosis: Yes Has patient had a PCN reaction that required hospitalization: No Has patient had a PCN reaction occurring within the last 10 years: No If all of the above answers are "NO", then may proceed with  Cephalosporin use.     Current Outpatient Medications  Medication Sig Dispense Refill  . acetaminophen (TYLENOL) 325 MG tablet Take 2 tablets (650 mg total) by mouth every 6 (six) hours as needed for mild pain.    Marland Kitchen ALPRAZolam (XANAX) 0.25 MG tablet Take 1 tablet (0.25 mg total) by mouth every 8 (eight) hours as needed for up to 20 doses for anxiety. 20 tablet 0  . aspirin EC 81 MG tablet Take 81 mg by mouth daily.    Marland Kitchen atorvastatin (LIPITOR) 80 MG tablet TAKE 1 TABLET BY MOUTH EVERY DAY (Patient taking differently: Take 80 mg by mouth every evening. ) 90 tablet 2  . Calcium Carb-Cholecalciferol (CALCIUM 600+D3 PO) Take 1 tablet by mouth daily.    . carboxymethylcellulose (REFRESH PLUS) 0.5 % SOLN Place 1 drop into both eyes daily as needed (dry eyes).     . CINNAMON PO Take 1,000 mg by mouth every evening.     . clindamycin (CLEOCIN) 300 MG capsule Take 2 tablets (600 mg total) 30-60 minutes prior to dental or medical procedure. (Patient taking differently: Take 600 mg by  mouth See admin instructions. Take 2 tablets (600 mg total) 30-60 minutes prior to dental or medical procedure.) 8 capsule 1  . Coenzyme Q10 (COQ10 PO) Take 1 capsule by mouth daily.    . fexofenadine (ALLEGRA) 180 MG tablet Take 180 mg by mouth every evening.    Javier Docker Oil 350 MG CAPS Take 350 mg by mouth every evening.    . Multiple Vitamins-Minerals (EYE SUPPORT PO) Take 1 tablet by mouth daily.    . NONFORMULARY OR COMPOUNDED ITEM Apply 2 sprays topically as directed. Testosterone Topical  Preparation 100 mg (Compounded by [place])  Apply 2 clicks (50 mg) daily    . ramipril (ALTACE) 10 MG capsule TAKE 1 CAPSULE BY MOUTH EVERY DAY (Patient taking differently: Take 10 mg by mouth daily. ) 90 capsule 3  . traMADol (ULTRAM) 50 MG tablet Take 1 tablet (50 mg total) by mouth every 6 (six) hours as needed (mild pain). 40 tablet 0  . warfarin (COUMADIN) 10 MG tablet TAKE 1 TO 1 AND 1/2 TABLETS DAILY AS  DIRECTED BY COUMADIN CLINIC (Patient taking differently: Take 10-15 mg by mouth See admin instructions. Take 1 tablet (10 mg) by mouth in the evening on Sundays, Tuesdays, Wednesdays, Thursdays, Fridays & Saturday. Take 1.5 tablets (15 mg) by mouth in the evening on Mondays only.) 135 tablet 1   No current facility-administered medications for this visit.    Review of Systems  Constitutional: positive for fatigue and weight loss Eyes: negative Ears, nose, mouth, throat, and face: negative Respiratory: positive for cough and dyspnea on exertion Cardiovascular: negative Gastrointestinal: negative Genitourinary:negative Integument/breast: negative Hematologic/lymphatic: negative Musculoskeletal:negative Neurological: negative Behavioral/Psych: negative Endocrine: negative Allergic/Immunologic: negative  Physical Exam  NIO:EVOJJ, healthy, no distress, well nourished, well developed and anxious SKIN: skin color, texture, turgor are normal, no rashes or significant lesions HEAD: Normocephalic, No masses, lesions, tenderness or abnormalities EYES: normal, PERRLA, Conjunctiva are pink and non-injected EARS: External ears normal, Canals clear OROPHARYNX:no exudate, no erythema and lips, buccal mucosa, and tongue normal  NECK: supple, no adenopathy, no JVD LYMPH:  no palpable lymphadenopathy, no hepatosplenomegaly BREAST:not examined LUNGS: clear to auscultation , and palpation HEART: regular rate & rhythm, no murmurs and no gallops ABDOMEN:abdomen soft, non-tender, normal bowel sounds and no masses or organomegaly BACK: No CVA tenderness, Range of motion is normal EXTREMITIES:no joint deformities, effusion, or inflammation, no edema  NEURO: alert & oriented x 3 with fluent speech, no focal motor/sensory deficits  PERFORMANCE STATUS: ECOG 1  LABORATORY DATA: Lab Results  Component Value Date   WBC 6.4 01/01/2020   HGB 10.7 (L) 01/01/2020   HCT 33.5 (L) 01/01/2020   MCV 98.0  01/01/2020   PLT 243 01/01/2020      Chemistry      Component Value Date/Time   NA 140 01/01/2020 1227   NA 139 10/28/2019 1057   K 4.3 01/01/2020 1227   CL 106 01/01/2020 1227   CO2 31 01/01/2020 1227   BUN 13 01/01/2020 1227   BUN 10 10/28/2019 1057   CREATININE 0.84 01/01/2020 1227      Component Value Date/Time   CALCIUM 8.8 (L) 01/01/2020 1227   ALKPHOS 100 01/01/2020 1227   AST 28 01/01/2020 1227   ALT 26 01/01/2020 1227   BILITOT 1.0 01/01/2020 1227       RADIOGRAPHIC STUDIES: DG Chest 1 View  Result Date: 12/12/2019 CLINICAL DATA:  Pneumothorax.  Chest tube. EXAM: CHEST  1 VIEW COMPARISON:  12/11/2019.  FINDINGS: Right chest tube in stable position. Right apical pneumothorax again noted, improved from prior exam. Small right pleural effusion cannot be excluded. Mild bibasilar atelectasis. Prior cardiac valve replacement. Stable cardiomegaly. Mild right chest wall subcutaneous emphysema. IMPRESSION: 1. Right chest tube in stable position. Right apical pneumothorax again noted, improved from prior exam. Small right pleural effusion cannot be excluded. Mild right chest wall subcutaneous emphysema. 2.  Mild bibasilar atelectasis. 3.  Prior cardiac valve replacement.  Stable cardiomegaly. Electronically Signed   By: Marcello Moores  Register   On: 12/12/2019 08:34   DG Chest 1 View  Result Date: 12/11/2019 CLINICAL DATA:  Pneumothorax EXAM: CHEST  1 VIEW COMPARISON:  12/10/2019 FINDINGS: Similar size of a moderate right apical pneumothorax. Similar positioning of a right-sided chest tube with tip of the chest tube projecting at the right lung apex. Similar partial lung collapse centrally. Postsurgical changes of CABG, median sternotomy, and aortic valve replacement. Similar cardiac contours. Similar subcutaneous emphysema involving the right chest wall. IMPRESSION: Similar moderate right pneumothorax with similar positioning of a right sided chest tube. Electronically Signed   By: Margaretha Sheffield MD   On: 12/11/2019 08:32   DG Chest 1 View  Result Date: 12/10/2019 CLINICAL DATA:  Chest tube present, history of pneumothorax. EXAM: CHEST  1 VIEW COMPARISON:  Multiple priors most recent December 09, 2019 FINDINGS: RIGHT-sided chest tube remains in place with persistent subcutaneous emphysema. Tip of chest tube at the RIGHT lung apex. Sideholes within the chest cavity. Moderate pneumothorax approximately 4 cm from the apex with partial lung collapse centrally based on added density along the RIGHT heart border. Post median sternotomy. Cardiomediastinal contours hilar structures are stable with changes of aortic valve replacement and CABG. LEFT chest is clear. No mediastinal shift. On limited assessment skeletal structures without acute process. IMPRESSION: 1. Moderate RIGHT pneumothorax despite presence of RIGHT-sided chest tube. 2. Stable appearance of the RIGHT chest tube with subcutaneous emphysema. These results were called by telephone at the time of interpretation on 12/10/2019 at 9:10 am to provider [facility] , who verbally acknowledged these results. Electronically Signed   By: Zetta Bills M.D.   On: 12/10/2019 09:10   DG Chest 2 View  Result Date: 12/18/2019 CLINICAL DATA:  Follow-up pneumothorax EXAM: CHEST - 2 VIEW COMPARISON:  12/17/2019 FINDINGS: Postoperative changes in the mediastinum. Interval removal of right chest tube. Small residual right apical pneumothorax is mildly increased in size since previous study. Persistent nodular opacity in the right lung base. Left lung is clear. Heart size and pulmonary vascularity are normal. IMPRESSION: Interval removal of right chest tube with mild increase in size of right apical pneumothorax. Electronically Signed   By: Lucienne Capers M.D.   On: 12/18/2019 06:42   DG Chest 2 View  Result Date: 12/14/2019 CLINICAL DATA:  Follow-up right chest tube and minimal right apical pneumothorax. EXAM: CHEST - 2 VIEW COMPARISON:  Yesterday. FINDINGS:  The right chest tube is unchanged. The tip is at the right lung apex. There is a small right apical pneumothorax with an interval mild increase in size, currently estimated at 5-10% of the volume of the right hemithorax. The heart remains normal in size with post CABG changes and prosthetic aortic valve noted. The lungs are mildly hyperexpanded. Lower thoracic spine degenerative changes. IMPRESSION: 1. 5-10% right apical pneumothorax, mildly increased in size. 2. Mild changes of COPD. Electronically Signed   By: Claudie Revering M.D.   On: 12/14/2019 11:45   DG Chest  2 View  Result Date: 12/08/2019 CLINICAL DATA:  74 year old male undergoing preoperative evaluation EXAM: CHEST - 2 VIEW COMPARISON:  Prior chest x-ray 11/17/2018 FINDINGS: Cardiac and mediastinal contours remain within normal limits. Patient is status post median sternotomy with evidence of prior CABG and aortic valve replacement. The lungs are clear. No suspicious pulmonary nodule or mass. No focal airspace consolidation, pulmonary edema, pneumothorax or pleural effusion. Osseous structures are intact and unremarkable. IMPRESSION: No active cardiopulmonary disease. Electronically Signed   By: Jacqulynn Cadet M.D.   On: 12/08/2019 07:51   MR BRAIN W WO CONTRAST  Result Date: 12/18/2019 CLINICAL DATA:  Non-small cell lung cancer, staging. EXAM: MRI HEAD WITHOUT AND WITH CONTRAST TECHNIQUE: Multiplanar, multiecho pulse sequences of the brain and surrounding structures were obtained without and with intravenous contrast. CONTRAST:  38mL GADAVIST GADOBUTROL 1 MMOL/ML IV SOLN COMPARISON:  None. FINDINGS: Brain: No diffusion-weighted signal abnormality. Moderate cerebral atrophy with ex vacuo dilatation. Numerous scattered foci of SWI signal dropout demonstrating peripheral predominance. Superficial siderosis involving the bilateral frontal, parietooccipital and bilateral cerebellar regions. Scattered and confluent FLAIR hyperintense foci involving the  periventricular and subcortical white matter. No midline shift, ventriculomegaly or extra-axial fluid collection. No mass lesion. No abnormal enhancement. Vascular: Major intracranial vessels are patent. Tortuosity and focal dilatation measuring up to 9.7 mm (18:8) involving the distal left ICA cervical segment. Skull and upper cervical spine: Normal marrow signal. Sinuses/Orbits: Sequela of bilateral lens replacement. Clear paranasal sinuses. Trace right mastoid effusion. Other: None. IMPRESSION: No evidence of intracranial metastases. No acute intracranial process. Sequela of cerebral amyloid angiopathy. Superficial siderosis involving the bilateral frontal, parietooccipital and cerebellar regions. Moderate cerebral atrophy. Distal left ICA cervical segment dilatation measuring up to 9.7 mm. Consider MRA neck for further evaluation. These results will be called to the ordering clinician or representative by the Radiologist Assistant, and communication documented in the PACS or [employer]. Electronically Signed   By: Primitivo Gauze M.D.   On: 12/18/2019 10:33   DG Chest Port 1 View  Result Date: 12/17/2019 CLINICAL DATA:  Chest tube placement for recent pneumothorax EXAM: PORTABLE CHEST 1 VIEW COMPARISON:  December 17, 2019 study obtained earlier in the day FINDINGS: Chest tube is again noted on the right with the tip in the right apex region, unchanged in position. There is a small right apical pneumothorax, stable. There is loculated effusion in the right base region laterally. Lungs elsewhere are clear. Heart size and pulmonary vascularity are normal. Patient is status post aortic valve replacement and coronary artery bypass grafting. No evident adenopathy. No bone lesions. IMPRESSION: Chest tube on the right with small apical pneumothorax, unchanged. No tension component. Loculated pleural effusion lateral right base. No new opacity evident. Stable cardiac silhouette. Electronically Signed   By:  Lowella Grip III M.D.   On: 12/17/2019 12:03   DG CHEST PORT 1 VIEW  Result Date: 12/17/2019 CLINICAL DATA:  74 year old male status post right upper lobectomy for adenocarcinoma on 12/08/2019. Chest tube. EXAM: PORTABLE CHEST 1 VIEW COMPARISON:  Portable chest 12/16/2019 and earlier. FINDINGS: Portable AP upright view at 0650 hours. Small right apical pneumothorax today is smaller since yesterday, and with a superimposed conspicuous skin fold also in the right upper chest. Stable right chest tube, tip at the right apex. Mildly larger lung volumes, mild postoperative tenting of the right diaphragm now. Stable cardiac size and mediastinal contours. No confluent pulmonary opacity. Stable visualized osseous structures. Prior sternotomy. IMPRESSION: 1. Stable right chest tube with  decreased right apical pneumothorax. 2. Larger lung volumes.  No new cardiopulmonary abnormality. Electronically Signed   By: Genevie Ann M.D.   On: 12/17/2019 07:58   DG CHEST PORT 1 VIEW  Result Date: 12/16/2019 CLINICAL DATA:  Recent thoracotomy with chest tube in place EXAM: PORTABLE CHEST 1 VIEW COMPARISON:  December 15, 2019 FINDINGS: Chest tube remains on the right, unchanged in position. Pneumothorax on the right is smaller compared to 1 day prior. There is a small right pleural effusion. There is mild bibasilar atelectasis. Heart size is upper normal with pulmonary vascularity normal. No adenopathy. Patient is status post aortic valve replacement and coronary artery bypass grafting. No adenopathy. No bone lesions. IMPRESSION: Pneumothorax on the right smaller compared to 1 day prior. No tension component. Chest tube position unchanged. Small right pleural effusion. Mild bibasilar atelectasis. Lungs otherwise clear. Stable cardiac silhouette. Electronically Signed   By: Lowella Grip III M.D.   On: 12/16/2019 07:56   DG Chest Port 1 View  Result Date: 12/15/2019 CLINICAL DATA:  Chest tube placement EXAM: PORTABLE CHEST 1  VIEW COMPARISON:  12/14/2019; 12/13/2019; 12/12/2019 FINDINGS: Grossly unchanged cardiac silhouette and mediastinal contours post median sternotomy and valve replacement. Stable positioning of support apparatus with continued slight increase in size small right-sided pneumothorax now with apical and basilar components. Resolved right-sided pleural effusion. The left lung remains well aerated. No evidence of edema. No acute osseous abnormalities. IMPRESSION: Stable positioning of support apparatus with continued slight increase in size of small right-sided pneumothorax, now with apical and basilar components. Continued attention on follow-up is advised. Electronically Signed   By: Sandi Mariscal M.D.   On: 12/15/2019 12:52   DG Chest Port 1 View  Result Date: 12/13/2019 CLINICAL DATA:  Pneumothorax. EXAM: PORTABLE CHEST 1 VIEW COMPARISON:  December 12, 2019. FINDINGS: Status post cardiac valve repair. Right-sided chest tube is unchanged in position. Stable minimal right apical pneumothorax is noted minimal bibasilar subsegmental atelectasis is noted. Bony thorax is unremarkable. IMPRESSION: Stable minimal right apical pneumothorax. Right-sided chest tube is unchanged in position. Minimal bibasilar subsegmental atelectasis. Electronically Signed   By: Marijo Conception M.D.   On: 12/13/2019 08:49   DG CHEST PORT 1 VIEW  Result Date: 12/09/2019 CLINICAL DATA:  Chest tube.  Pneumothorax. EXAM: PORTABLE CHEST 1 VIEW COMPARISON:  12/08/2019. FINDINGS: Right chest tube in stable position. No definite pneumothorax noted on today's exam. Mild right chest wall subcutaneous emphysema again noted. Prior CABG. Heart size stable. No focal infiltrate. Mild bibasilar subsegmental atelectasis again noted. Tiny left pleural effusion cannot be excluded. IMPRESSION: 1. Right chest tube in stable position. No pneumothorax noted on today's exam. Right chest wall subcutaneous emphysema again noted. 2. Mild bibasilar subsegmental  atelectasis again noted. Tiny left pleural effusion cannot be excluded. Electronically Signed   By: Marcello Moores  Register   On: 12/09/2019 08:13   DG Chest Port 1 View  Result Date: 12/08/2019 CLINICAL DATA:  Right upper lobe wedge resection for lung mass. EXAM: PORTABLE CHEST 1 VIEW COMPARISON:  12/08/2019, 6:01 a.m. FINDINGS: Prior median sternotomy. Midline trachea. Aortic valve repair. Borderline cardiomegaly. Right-sided chest tube terminating over the upper right chest. No pleural fluid. Small volume subcutaneous air. The right hemidiaphragm is well delineated, and small volume sub pulmonic air is suspected. No apical component. Left base subsegmental atelectasis. IMPRESSION: Right-sided postoperative changes with chest tube in place. Small volume right-sided subcutaneous air with suspicion of small volume sub pulmonic pneumothorax. Electronically Signed   By: Marylyn Ishihara  Jobe Igo M.D.   On: 12/08/2019 12:28    ASSESSMENT: This is a very pleasant 74 years old white male recently diagnosed with a stage IIIb (T3, N2, M0) non-small cell lung cancer, adenocarcinoma presented with right upper lobe lung mass in addition to right hilar and mediastinal lymphadenopathy status post right upper lobectomy with lymph node sampling under the care of Dr. Kipp Brood on December 08, 2019.   PLAN: I had a lengthy discussion with the patient and his wife today about his current disease stage, prognosis and treatment options.  I explained to the patient that there is a survival benefit for adjuvant systemic therapy for patient with a stage IIIb non-small cell lung cancer after surgical resection.  I discussed with him several options including enrollment in the alchemist clinical trial for molecular testing to see if the patient has any actionable mutations for consideration of treatment with target therapy with or without chemotherapy. If the patient has no actionable mutations, we would consider him for systemic chemotherapy with  platinum based regimen likely cisplatin and Alimta or enrollment in the alliance trial were The patient will be treated with standard chemotherapy plus minus immunotherapy. The patient is interested in the alchemist trial and he will be seen by the clinical research nurse later today for evaluation and discussion of this option. I will see him back for follow-up visit in 3 weeks for evaluation and more detailed discussion of his adjuvant therapy based on the molecular studies. The patient was advised to call immediately if he has any concerning symptoms in the interval. The patient voices understanding of current disease status and treatment options and is in agreement with the current care plan. All questions were answered. The patient knows to call the clinic with any problems, questions or concerns. We can certainly see the patient much sooner if necessary. Thank you so much for allowing me to participate in the care of Marcus [name]. I will continue to follow up the patient with you and assist in his care.  The total time spent in the appointment was 90 minutes.  Disclaimer: This note was dictated with voice recognition software. Similar sounding words can inadvertently be transcribed and may not be corrected upon review.   Eilleen Kempf January 01, 2020, 1:23 PM

## 2020-01-01 NOTE — Progress Notes (Signed)
Cardiology Office Note:    Date:  01/03/2020   ID:  Marcus [name], DOB 02/18/1946, MRN [id number]  PCP:  [name], MD  [facility] Cardiologist:  [name], MD  [place] Electrophysiologist:  None   Referring MD: Norberto Sorenson, MD   Chief Complaint  Patient presents with  . Follow-up    seen for Dr. Sallyanne Kuster    History of Present Illness:    Marcus [name] is a 74 y.o. male with a hx of mechanical aortic valve replacement and repair of ascending aortic aneurysm (Bentall procedure), history of severe asymptomatic sinus bradycardia, CAD CABG x 2 (SVG-RCA and SVG-OM 2008), HTN, OSA on CPAP and postop PAF.  He required cardioversion for postop atrial fibrillation in 2009.  Stress echocardiogram obtained in January 2017 showed normal wall motion however ETT portion had ST elevation.  It was felt the EKG portion was falsely positive.  Last echocardiogram obtained on 11/25/2018 showed EF 60 to 65%, normal RV systolic function, severely enlarged RV, stable mechanical aortic valve.  Since last office visit, he had interval enlargement of right upper lobe lung mass on CT scan in July 2021 and was referred to Dr. Kipp Brood.  He underwent a Myoview on 12/02/2019 which showed normal perfusion, EF 63%.  Patient eventually underwent robotic assisted right video thoracoscopy and right upper lobe wedge resection for lung mass by Dr. Kipp Brood on 12/08/2019.  Postprocedure, he had episodic PAF and pneumothorax on the chest x-ray.  Patient presents today for post hospital follow-up.  He denies any chest pain, however has a sore spot under the right 8th rib.  This is clearly musculoskeletal in nature.  The bruising on the right flank has significantly improved since the hospitalization.  He denies any significant shortness of breath.  On physical exam, there is no sign of leg enlarging pneumothorax.  His appetite is still poor, this likely will take several weeks for him to recover.  EKG  today shows he is back in normal sinus rhythm.  I recommended continue on the current therapy.  Last INR obtained on 12/29/2019 shows therapeutic INR of 2.9.  He has next INR check on 01/05/2020.  He also has follow-up with Dr. Sallyanne Kuster in December, I will check with Dr. Sallyanne Kuster to see if he would prefer a repeat echocardiogram prior to the visit.    Past Medical History:  Diagnosis Date  . Atrial fibrillation ([place])   . Dyspnea    with exertion   . Dysrhythmia   . Hypertension   . Obstructive sleep apnea 10/25/2007   cpap  . S/P AVR    2D ECHO, 04/25/2011 - EF >55%, Right ventricle-mild-moderately dilated  . Swelling of limb    LEA VENOUS, 08/22/2009 - no evidence of deep vein or superficial thrombosis; partially rupturing Baker's Cyst    Past Surgical History:  Procedure Laterality Date  . CARDIAC CATHETERIZATION Bilateral 05/10/2007   Significant 1-vessel disease, severely dilated aortic root with moderate severe aortic insufficiency  . CARDIAC SURGERY    . CARDIOVERSION  08/02/2007   150 Joule biphasic shock with restoration of sinus rhythm. Heart rate 60.  . cataract surgery     . COLONOSCOPY WITH PROPOFOL N/A 12/15/2016   Procedure: COLONOSCOPY WITH PROPOFOL;  Surgeon: Carol Ada, MD;  Location: [facility];  Service: Endoscopy;  Laterality: N/A;  . EYE SURGERY    . INTERCOSTAL NERVE BLOCK Right 12/08/2019   Procedure: INTERCOSTAL NERVE BLOCK;  Surgeon: [name], MD;  Location: [facility] OR;  Service: Thoracic;  Laterality: Right;  . NM MYOVIEW LTD  04/08/2007   No evidence of inducible myocardial ischemia  . NODE DISSECTION Right 12/08/2019   Procedure: NODE DISSECTION;  Surgeon: [name], MD;  Location: [facility];  Service: Thoracic;  Laterality: Right;  . THYROIDECTOMY, PARTIAL    . torn meniscus in right knee surgery       Current Medications: Current Meds  Medication Sig  . acetaminophen (TYLENOL) 325 MG tablet Take 2 tablets (650 mg total) by mouth every 6  (six) hours as needed for mild pain.  Marland Kitchen ALPRAZolam (XANAX) 0.25 MG tablet Take 1 tablet (0.25 mg total) by mouth every 8 (eight) hours as needed for up to 20 doses for anxiety.  Marland Kitchen aspirin EC 81 MG tablet Take 81 mg by mouth daily.  Marland Kitchen atorvastatin (LIPITOR) 80 MG tablet TAKE 1 TABLET BY MOUTH EVERY DAY (Patient taking differently: Take 80 mg by mouth every evening. )  . Calcium Carb-Cholecalciferol (CALCIUM 600+D3 PO) Take 1 tablet by mouth daily.  . carboxymethylcellulose (REFRESH PLUS) 0.5 % SOLN Place 1 drop into both eyes daily as needed (dry eyes).   . CINNAMON PO Take 1,000 mg by mouth every evening.   . clindamycin (CLEOCIN) 300 MG capsule Take 2 tablets (600 mg total) 30-60 minutes prior to dental or medical procedure. (Patient taking differently: Take 600 mg by mouth See admin instructions. Take 2 tablets (600 mg total) 30-60 minutes prior to dental or medical procedure.)  . Coenzyme Q10 (COQ10 PO) Take 1 capsule by mouth daily.  . fexofenadine (ALLEGRA) 180 MG tablet Take 180 mg by mouth every evening.  Javier Docker Oil 350 MG CAPS Take 350 mg by mouth every evening.  . Multiple Vitamins-Minerals (EYE SUPPORT PO) Take 1 tablet by mouth daily.  . NONFORMULARY OR COMPOUNDED ITEM Apply 2 sprays topically as directed. Testosterone Topical  Preparation 100 mg (Compounded by [place])  Apply 2 clicks (50 mg) daily  . ramipril (ALTACE) 10 MG capsule TAKE 1 CAPSULE BY MOUTH EVERY DAY (Patient taking differently: Take 10 mg by mouth daily. )  . traMADol (ULTRAM) 50 MG tablet Take 1 tablet (50 mg total) by mouth every 6 (six) hours as needed (mild pain).  Marland Kitchen warfarin (COUMADIN) 10 MG tablet TAKE 1 TO 1 AND 1/2 TABLETS DAILY AS DIRECTED BY COUMADIN CLINIC (Patient taking differently: Take 10-15 mg by mouth See admin instructions. Take 1 tablet (10 mg) by mouth in the evening on Sundays, Tuesdays, Wednesdays, Thursdays, Fridays & Saturday. Take 1.5 tablets (15 mg) by mouth in the  evening on Mondays only.)     Allergies:   Amlodipine, Tetanus toxoids, Crestor [rosuvastatin], and Penicillins   Social History   Socioeconomic History  . Marital status: Married    Spouse name: diane  . Number of children: Not on file  . Years of education: Not on file  . Highest education level: Not on file  Occupational History  . Occupation: retired  Tobacco Use  . Smoking status: Former Smoker    Types: Cigarettes    Quit date: 04/11/1987    Years since quitting: 32.7  . Smokeless tobacco: Never Used  Vaping Use  . Vaping Use: Never used  Substance and Sexual Activity  . Alcohol use: No  . Drug use: No  . Sexual activity: Not on file  Other Topics Concern  . Not on file  Social History Narrative  . Not on file  Social Determinants of Health   Financial Resource Strain:   . Difficulty of Paying Living Expenses: Not on file  Food Insecurity: No Food Insecurity  . Worried About Charity fundraiser in the Last Year: Never true  . Ran Out of Food in the Last Year: Never true  Transportation Needs: No Transportation Needs  . Lack of Transportation (Medical): No  . Lack of Transportation (Non-Medical): No  Physical Activity:   . Days of Exercise per Week: Not on file  . Minutes of Exercise per Session: Not on file  Stress:   . Feeling of Stress : Not on file  Social Connections:   . Frequency of Communication with Friends and Family: Not on file  . Frequency of Social Gatherings with Friends and Family: Not on file  . Attends Religious Services: Not on file  . Active Member of Clubs or Organizations: Not on file  . Attends Archivist Meetings: Not on file  . Marital Status: Not on file     Family History: The patient's family history includes Cancer in his father.  ROS:   Please see the history of present illness.     All other systems reviewed and are negative.  EKGs/Labs/Other Studies Reviewed:    The following studies were reviewed  today:  Myoview 12/02/2019  Nuclear stress EF: 63%.  There was no ST segment deviation noted during stress.  No T wave inversion was noted during stress.  The study is normal.  This is a low risk study.   Low risk stress nuclear study with normal perfusion and normal left ventricular regional and global systolic function.   EKG:  EKG is ordered today.  The ekg ordered today demonstrates normal sinus rhythm, right bundle branch block.  Recent Labs: 01/01/2020: ALT 26; BUN 13; Creatinine 0.84; Hemoglobin 10.7; Platelet Count 243; Potassium 4.3; Sodium 140  Recent Lipid Panel No results found for: CHOL, TRIG, HDL, CHOLHDL, VLDL, LDLCALC, LDLDIRECT  Physical Exam:    VS:  BP 130/64   Pulse (!) 55   Ht 5\' 11"  (1.803 m)   Wt 171 lb (77.6 kg)   BMI 23.85 kg/m     Wt Readings from Last 3 Encounters:  01/02/20 172 lb 6.4 oz (78.2 kg)  01/01/20 170 lb 6.4 oz (77.3 kg)  01/01/20 171 lb (77.6 kg)     GEN:  Well nourished, well developed in no acute distress HEENT: Normal NECK: No JVD; No carotid bruits LYMPHATICS: No lymphadenopathy CARDIAC: RRR, no murmurs, rubs, gallops RESPIRATORY:  Clear to auscultation without rales, wheezing or rhonchi  ABDOMEN: Soft, non-tender, non-distended MUSCULOSKELETAL:  No edema; No deformity  SKIN: Warm and dry NEUROLOGIC:  Alert and oriented x 3 PSYCHIATRIC:  Normal affect   ASSESSMENT:    1. PAF (paroxysmal atrial fibrillation) ([place])   2. H/O mechanical aortic valve replacement   3. H/O aortic valve replacement   4. Coronary artery disease involving coronary bypass graft of native heart without angina pectoris   5. Essential hypertension   6. Lung mass    PLAN:    In order of problems listed above:  1. Postop atrial fibrillation: Patient is on Coumadin for history of mechanical aortic valve replacement.  He had recurrent postop atrial fibrillation, most recent episode was after resection for lung mass.  He has since converted back to  sinus rhythm.  2. History of mechanical aortic valve: Stable on last echocardiogram in August 2020, will check with Dr. Lacinda Axon tomorrow to  see if he prefer a repeat echocardiogram prior to the next visit.  3. History of aortic valve replacement: Stable appearance of aortic root graft on recent CTA obtained on 11/06/2019  4. CAD s/p CABG: Denies any recent chest pain.  He has a sore spot after the recent lung resection.  This is clearly musculoskeletal in nature  5. Hypertension: Blood pressure stable  6. Lung mass: Status post right upper lobe wedge resection by Dr. Kipp Brood.   Medication Adjustments/Labs and Tests Ordered: Current medicines are reviewed at length with the patient today.  Concerns regarding medicines are outlined above.  Orders Placed This Encounter  Procedures  . EKG 12-Lead   No orders of the defined types were placed in this encounter.   Patient Instructions  Medication Instructions:  Your physician recommends that you continue on your current medications as directed. Please refer to the Current Medication list given to you today.  *If you need a refill on your cardiac medications before your next appointment, please call your pharmacy*  Lab Work: NONE ordered at this time of appointment   If you have labs (blood work) drawn today and your tests are completely normal, you will receive your results only by: Marland Kitchen MyChart Message (if you have MyChart) OR . A paper copy in the mail If you have any lab test that is abnormal or we need to change your treatment, we will call you to review the results.  Testing/Procedures: NONE ordered at this time of appointment   Follow-Up: At [facility], you and your health needs are our priority.  As part of our continuing mission to provide you with exceptional heart care, we have created designated Provider Care Teams.  These Care Teams include your primary Cardiologist (physician) and Advanced Practice Providers (APPs -   Physician Assistants and Nurse Practitioners) who all work together to provide you with the care you need, when you need it.  Your next appointment:   Follow up as scheduled   The format for your next appointment:   In Person  Provider:   [name], MD   Other Instructions      Signed, Almyra Deforest, [place]  01/03/2020 7:35 PM    [place]

## 2020-01-02 ENCOUNTER — Ambulatory Visit (INDEPENDENT_AMBULATORY_CARE_PROVIDER_SITE_OTHER): Payer: Medicare HMO | Admitting: Family Medicine

## 2020-01-02 ENCOUNTER — Encounter: Payer: Self-pay | Admitting: Family Medicine

## 2020-01-02 VITALS — BP 118/60 | HR 72 | Temp 97.2°F | Ht 68.75 in | Wt 172.4 lb

## 2020-01-02 DIAGNOSIS — C3491 Malignant neoplasm of unspecified part of right bronchus or lung: Secondary | ICD-10-CM

## 2020-01-02 DIAGNOSIS — Z952 Presence of prosthetic heart valve: Secondary | ICD-10-CM

## 2020-01-02 DIAGNOSIS — I10 Essential (primary) hypertension: Secondary | ICD-10-CM

## 2020-01-02 DIAGNOSIS — J449 Chronic obstructive pulmonary disease, unspecified: Secondary | ICD-10-CM | POA: Diagnosis not present

## 2020-01-02 DIAGNOSIS — E7800 Pure hypercholesterolemia, unspecified: Secondary | ICD-10-CM | POA: Diagnosis not present

## 2020-01-02 NOTE — Patient Instructions (Signed)
Follow up in December for your routine cholesterol/blood pressure appt No need for labs today If the anxiety changes or worsens, let me know Call with any questions or concerns Hang in there!!!

## 2020-01-02 NOTE — Research (Signed)
[id number] (ALCHEMIST) GENETIC TESTING FOR PATIENTS WITH RESECTABLE or RESECTED LUNG CANCER  01/02/2020 11:45AM  REGISTRATION: Marcus [name] is registered to the ALCHEMIST study and assigned PID# [id number]. Still pending receipt of tissue block. Current plan is to forward tissue block once received.   Dionne Bucy. Sharlett Iles, BSN, RN, CIC 01/02/2020 12:13 PM

## 2020-01-02 NOTE — Progress Notes (Signed)
   Subjective:    Patient ID: Marcus [name], male    DOB: 05/17/1945, 74 y.o.   MRN: [id number]  HPI New to establish.  Previous MD [place].  Lung Cancer- pt was dx'd on 8/30 after removal of lung mass.  Saw Oncology yesterday for 1st time.  They are hoping to enroll pt in a trial.  Pt reports 'some soreness' but otherwise is feeling well.  No SOB.  S/p aortic valve repair- St Jude's valve in place.  On life long Coumadin.  COPD- pt had PFTs done in March that showed mild obstruction/hyperinflation.  No improvement w/ Spiriva.  Wife feels COPD may be a misdiagnosis and his breathing difficulties were due to presence of mass.  HTN- chronic problem, on Ramipril 10mg  daily w/ good control.  No CP, HAs, visual changes, edema.  Recent labs WNL.  Hyperlipidemia- chronic problem, on Lipitor 80mg  daily.  No abd pain, N/V.  Labs due December  Reviewed past medical, surgical, family and social histories.   Pt care team reviewed and updated   Review of Systems For ROS see HPI   This visit occurred during the SARS-CoV-2 public health emergency.  Safety protocols were in place, including screening questions prior to the visit, additional usage of staff PPE, and extensive cleaning of exam room while observing appropriate contact time as indicated for disinfecting solutions.       Objective:   Physical Exam Vitals reviewed.  Constitutional:      General: He is not in acute distress.    Appearance: He is well-developed. He is not ill-appearing.  HENT:     Head: Normocephalic and atraumatic.  Eyes:     Conjunctiva/sclera: Conjunctivae normal.     Pupils: Pupils are equal, round, and reactive to light.  Neck:     Thyroid: No thyromegaly.  Cardiovascular:     Rate and Rhythm: Normal rate and regular rhythm.     Heart sounds: Murmur (loud valvular click w/ II/VI SEM) heard.   Pulmonary:     Effort: Pulmonary effort is normal. No respiratory distress.     Comments: Absent BS on  R Abdominal:     General: Bowel sounds are normal. There is no distension.     Palpations: Abdomen is soft.  Musculoskeletal:     Cervical back: Normal range of motion and neck supple.  Lymphadenopathy:     Cervical: No cervical adenopathy.  Skin:    General: Skin is warm and dry.  Neurological:     Mental Status: He is alert and oriented to person, place, and time.     Cranial Nerves: No cranial nerve deficit.  Psychiatric:        Behavior: Behavior normal.           Assessment & Plan:

## 2020-01-03 ENCOUNTER — Encounter: Payer: Self-pay | Admitting: Physician Assistant

## 2020-01-04 NOTE — Assessment & Plan Note (Signed)
New to provider, ongoing for pt.  On Lipitor 80mg  daily w/o difficulty.  Due for labs in December.

## 2020-01-04 NOTE — Assessment & Plan Note (Signed)
New to provider, ongoing for pt.  On life long coumadin for St Jude valve.  Coumadin dosing per coumadin clinic

## 2020-01-04 NOTE — Assessment & Plan Note (Signed)
New to provider, ongoing for pt.  Currently well controlled on Ramipril 10mg  daily.  Asymptomatic.  Reviewed recent labs from hospitalization.  No changes at this time.

## 2020-01-04 NOTE — Assessment & Plan Note (Signed)
New dx as of 8/30 s/p wedge resection of R lung.  Had 1st oncology appt yesterday and they are determining the next stepds.  Hopeful that there is a trial for him to enroll in.  Some soreness over incision but otherwise asymptomatic.  Will continue to follow along.

## 2020-01-04 NOTE — Assessment & Plan Note (Signed)
New to provider.  Pt had PFTs done in March that showed mild obstruction/hyperinflation.  No improvement w/ Spiriva.  Wife feels this may have been a misdiagnosis and that his shortness of breath and obstruction were possibly mass related.  Time will tell us more as his mass has been removed and he has tx upcoming.

## 2020-01-05 ENCOUNTER — Ambulatory Visit (INDEPENDENT_AMBULATORY_CARE_PROVIDER_SITE_OTHER): Payer: Medicare HMO | Admitting: Internal Medicine

## 2020-01-05 DIAGNOSIS — Z952 Presence of prosthetic heart valve: Secondary | ICD-10-CM

## 2020-01-05 DIAGNOSIS — Z79899 Other long term (current) drug therapy: Secondary | ICD-10-CM | POA: Diagnosis not present

## 2020-01-05 LAB — POCT INR: INR: 2.6 (ref 2.0–3.0)

## 2020-01-06 ENCOUNTER — Telehealth: Payer: Self-pay

## 2020-01-06 ENCOUNTER — Telehealth: Payer: Self-pay | Admitting: Physician Assistant

## 2020-01-06 DIAGNOSIS — H353132 Nonexudative age-related macular degeneration, bilateral, intermediate dry stage: Secondary | ICD-10-CM | POA: Diagnosis not present

## 2020-01-06 DIAGNOSIS — H02831 Dermatochalasis of right upper eyelid: Secondary | ICD-10-CM | POA: Diagnosis not present

## 2020-01-06 DIAGNOSIS — H02834 Dermatochalasis of left upper eyelid: Secondary | ICD-10-CM | POA: Diagnosis not present

## 2020-01-06 DIAGNOSIS — H35033 Hypertensive retinopathy, bilateral: Secondary | ICD-10-CM | POA: Diagnosis not present

## 2020-01-06 DIAGNOSIS — C3491 Malignant neoplasm of unspecified part of right bronchus or lung: Secondary | ICD-10-CM

## 2020-01-06 NOTE — Telephone Encounter (Signed)
Called and spoke with patient about r/s 10/12 appt. Confirmed new date

## 2020-01-06 NOTE — Telephone Encounter (Signed)
[id number] (ALCHEMIST) GENETIC TESTING FOR PATIENTS WITH RESECTABLE or RESECTED LUNG CANCER  01/06/2020 09:45AM  OUTGOING CALL: Outgoing call to [facility] Pathology department, spoke with [facility]. Varney Biles states the pathology request has been received and the block will be arriving to the research department today for subsequent shipment. Awaiting arrival of block.  Dionne Bucy. Sharlett Iles, BSN, RN, CIC 01/06/2020 9:51 AM

## 2020-01-07 ENCOUNTER — Other Ambulatory Visit: Payer: Self-pay | Admitting: *Deleted

## 2020-01-07 NOTE — Progress Notes (Signed)
The proposed treatment discussed in cancer conference 01/01/20 is for discussion purpose only and is not a binding recommendation.  The patient was not physically examined nor present for their treatment options.  Therefore, final treatment plans cannot be decided.

## 2020-01-13 ENCOUNTER — Ambulatory Visit (INDEPENDENT_AMBULATORY_CARE_PROVIDER_SITE_OTHER): Payer: Medicare HMO | Admitting: Pharmacist Clinician (PhC)/ Clinical Pharmacy Specialist

## 2020-01-13 DIAGNOSIS — L570 Actinic keratosis: Secondary | ICD-10-CM | POA: Diagnosis not present

## 2020-01-13 DIAGNOSIS — Z952 Presence of prosthetic heart valve: Secondary | ICD-10-CM

## 2020-01-13 DIAGNOSIS — L853 Xerosis cutis: Secondary | ICD-10-CM | POA: Diagnosis not present

## 2020-01-13 DIAGNOSIS — Z7901 Long term (current) use of anticoagulants: Secondary | ICD-10-CM | POA: Diagnosis not present

## 2020-01-13 DIAGNOSIS — Z09 Encounter for follow-up examination after completed treatment for conditions other than malignant neoplasm: Secondary | ICD-10-CM | POA: Diagnosis not present

## 2020-01-13 DIAGNOSIS — D692 Other nonthrombocytopenic purpura: Secondary | ICD-10-CM | POA: Diagnosis not present

## 2020-01-13 DIAGNOSIS — Z08 Encounter for follow-up examination after completed treatment for malignant neoplasm: Secondary | ICD-10-CM | POA: Diagnosis not present

## 2020-01-13 DIAGNOSIS — L814 Other melanin hyperpigmentation: Secondary | ICD-10-CM | POA: Diagnosis not present

## 2020-01-13 DIAGNOSIS — L821 Other seborrheic keratosis: Secondary | ICD-10-CM | POA: Diagnosis not present

## 2020-01-13 DIAGNOSIS — Z85828 Personal history of other malignant neoplasm of skin: Secondary | ICD-10-CM | POA: Diagnosis not present

## 2020-01-13 DIAGNOSIS — X32XXXA Exposure to sunlight, initial encounter: Secondary | ICD-10-CM | POA: Diagnosis not present

## 2020-01-13 DIAGNOSIS — Z79899 Other long term (current) drug therapy: Secondary | ICD-10-CM

## 2020-01-13 DIAGNOSIS — D2371 Other benign neoplasm of skin of right lower limb, including hip: Secondary | ICD-10-CM | POA: Diagnosis not present

## 2020-01-13 LAB — POCT INR: INR: 3.1 — AB (ref 2.0–3.0)

## 2020-01-16 ENCOUNTER — Other Ambulatory Visit: Payer: Self-pay | Admitting: *Deleted

## 2020-01-16 DIAGNOSIS — C3491 Malignant neoplasm of unspecified part of right bronchus or lung: Secondary | ICD-10-CM

## 2020-01-19 LAB — POCT INR: INR: 1.6 — AB (ref 2.0–3.0)

## 2020-01-20 ENCOUNTER — Ambulatory Visit (INDEPENDENT_AMBULATORY_CARE_PROVIDER_SITE_OTHER): Payer: Medicare HMO | Admitting: Cardiovascular Disease

## 2020-01-20 ENCOUNTER — Other Ambulatory Visit: Payer: Medicare HMO

## 2020-01-20 ENCOUNTER — Other Ambulatory Visit: Payer: Self-pay | Admitting: Thoracic Surgery (Cardiothoracic Vascular Surgery)

## 2020-01-20 ENCOUNTER — Ambulatory Visit: Payer: Medicare HMO | Admitting: Physician Assistant

## 2020-01-20 DIAGNOSIS — C3491 Malignant neoplasm of unspecified part of right bronchus or lung: Secondary | ICD-10-CM

## 2020-01-20 DIAGNOSIS — Z79899 Other long term (current) drug therapy: Secondary | ICD-10-CM | POA: Diagnosis not present

## 2020-01-20 DIAGNOSIS — Z952 Presence of prosthetic heart valve: Secondary | ICD-10-CM | POA: Diagnosis not present

## 2020-01-21 ENCOUNTER — Telehealth: Payer: Self-pay | Admitting: Physician Assistant

## 2020-01-21 NOTE — Telephone Encounter (Signed)
R/s 10/19 to 10/20 per provider request. Called and spoke with patient. Confirmed change

## 2020-01-23 ENCOUNTER — Ambulatory Visit
Admission: RE | Admit: 2020-01-23 | Discharge: 2020-01-23 | Disposition: A | Discharge status: Home / Self Care | Payer: Medicare HMO | Source: Ambulatory Visit | Attending: Thoracic Surgery (Cardiothoracic Vascular Surgery) | Admitting: Thoracic Surgery (Cardiothoracic Vascular Surgery)

## 2020-01-23 ENCOUNTER — Encounter: Payer: Self-pay | Admitting: Thoracic Surgery (Cardiothoracic Vascular Surgery)

## 2020-01-23 ENCOUNTER — Ambulatory Visit (INDEPENDENT_AMBULATORY_CARE_PROVIDER_SITE_OTHER): Payer: Self-pay | Admitting: Thoracic Surgery (Cardiothoracic Vascular Surgery)

## 2020-01-23 ENCOUNTER — Other Ambulatory Visit: Payer: Self-pay

## 2020-01-23 VITALS — BP 130/71 | HR 57 | Resp 18 | Ht 68.0 in | Wt 172.0 lb

## 2020-01-23 DIAGNOSIS — C3491 Malignant neoplasm of unspecified part of right bronchus or lung: Secondary | ICD-10-CM

## 2020-01-23 DIAGNOSIS — Z902 Acquired absence of lung [part of]: Secondary | ICD-10-CM

## 2020-01-23 DIAGNOSIS — C3490 Malignant neoplasm of unspecified part of unspecified bronchus or lung: Secondary | ICD-10-CM | POA: Diagnosis not present

## 2020-01-23 NOTE — Progress Notes (Signed)
      [address]       [place] [address]             [phone]        Davian R Twining [place] Medical Record #[id number] Date of Birth: 16-Feb-1946  Referring: Troy Sine, MD Primary Care: [name], MD Primary Cardiologist:Mihai [name], MD  Reason for visit:   follow-up  History of Present Illness:     Mr. Palau comes in for his 1 month follow-up appointment.  He states that his respiratory status is much improved.  Continues to have some paresthesias along his access incision.  Otherwise she is doing well  Physical Exam: BP 130/71 (BP Location: Left Arm, Patient Position: Sitting)   Pulse (!) 57   Resp 18   Ht 5\' 8"  (1.727 m)   Wt 172 lb (78 kg)   SpO2 99% Comment: RA with mask on  BMI 26.15 kg/m   Alert NAD Incision well-healed.   Abdomen soft, ND No peripheral edema   Diagnostic Studies & Laboratory data: CXR: Clear, no pneumothorax.     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  He is status post robotic assisted lobectomy.  He is scheduled to have another follow-up appointment with Dr. Julien Nordmann with medical oncology next week.  He subsequently will start his adjuvant therapy.  In regards to his incisions the paresthesias should continue to resolve over the next few weeks.  He will follow-up as needed.   [name] 01/23/2020 11:56 AM

## 2020-01-25 NOTE — Progress Notes (Signed)
[facility] OFFICE PROGRESS NOTE  [name], MD [address] [place] [address] [place] [address]  DIAGNOSIS: Stage IIIb (T3, N2, M0) non-small cell lung cancer, adenocarcinoma presented with right upper lobe lung mass in addition to right hilar and mediastinal lymphadenopathy. Diagnosed in December 08, 2019.  Molecular Studies: Negative for actionable mutations  PDL1: 1-49%  PRIOR THERAPY:  1) Right upper lobectomy with lymph node sampling under the care of Dr. Kipp Brood on December 08, 2019.  CURRENT THERAPY: Adjuvant systemic chemotherapy with Cisplatin 75 mg/m2 and Alimta 500 mg/m2 IV every 3 weeks. First dose expected on 02/04/20.  INTERVAL HISTORY: Marcus [name] 74 y.o. male returns to clinic today for follow-up visit accompanied by his wife.  The patient is doing well today without any concerning complaints. The patient is status post right upper lobectomy lymph node dissection under the care of Dr. Kipp Brood which was performed on 12/08/2019.  Due to the size and the lymphadenopathy, Dr. Julien Nordmann had recommended that the patient undergo adjuvant systemic chemotherapy for 4 cycles versus possibly enrolling in the alliance trial which allows the patient to be assessed for molecular studies in addition to standard chemotherapy.  Today, the patient feeling well today.  He denies any recent fever, chills, night sweats, weight loss.  His incisional pain from his surgery is greatly improved at this time. He denies significant shortness of breath or cough.  He denies any hemoptysis.  He denies any headache or visual changes.  He denies any nausea, vomiting, diarrhea, or constipation.  The patient is here today for evaluation and more detailed discussion about his current condition and recommended treatment options based on the molecular studies.  MEDICAL HISTORY: Past Medical History:  Diagnosis Date  . Atrial fibrillation ([place])   . Dyspnea    with exertion   . Dysrhythmia   .  Hypertension   . Obstructive sleep apnea 10/25/2007   cpap  . S/P AVR    2D ECHO, 04/25/2011 - EF >55%, Right ventricle-mild-moderately dilated  . Swelling of limb    LEA VENOUS, 08/22/2009 - no evidence of deep vein or superficial thrombosis; partially rupturing Baker's Cyst    ALLERGIES:  is allergic to amlodipine, tetanus toxoids, crestor [rosuvastatin], and penicillins.  MEDICATIONS:  Current Outpatient Medications  Medication Sig Dispense Refill  . acetaminophen (TYLENOL) 325 MG tablet Take 2 tablets (650 mg total) by mouth every 6 (six) hours as needed for mild pain.    Marland Kitchen ALPRAZolam (XANAX) 0.25 MG tablet Take 1 tablet (0.25 mg total) by mouth every 8 (eight) hours as needed for up to 20 doses for anxiety. 20 tablet 0  . aspirin EC 81 MG tablet Take 81 mg by mouth daily.    Marland Kitchen atorvastatin (LIPITOR) 80 MG tablet TAKE 1 TABLET BY MOUTH EVERY DAY (Patient taking differently: Take 80 mg by mouth every evening. ) 90 tablet 2  . Calcium Carb-Cholecalciferol (CALCIUM 600+D3 PO) Take 1 tablet by mouth daily.    . carboxymethylcellulose (REFRESH PLUS) 0.5 % SOLN Place 1 drop into both eyes daily as needed (dry eyes).     . CINNAMON PO Take 1,000 mg by mouth every evening.     . clindamycin (CLEOCIN) 300 MG capsule Take 2 tablets (600 mg total) 30-60 minutes prior to dental or medical procedure. (Patient taking differently: Take 600 mg by mouth See admin instructions. Take 2 tablets (600 mg total) 30-60 minutes prior to dental or medical procedure.) 8 capsule 1  .  Coenzyme Q10 (COQ10 PO) Take 1 capsule by mouth daily.    . dexamethasone (DECADRON) 4 MG tablet Please take 1 tablet twice daily the day before, the day of, and the day after chemotherapy 40 tablet 2  . fexofenadine (ALLEGRA) 180 MG tablet Take 180 mg by mouth every evening.    . folic acid (FOLVITE) 1 MG tablet Take 1 tablet (1 mg total) by mouth daily. 30 tablet 2  . Krill Oil 350 MG CAPS Take 350 mg by mouth every evening.    .  Multiple Vitamins-Minerals (EYE SUPPORT PO) Take 1 tablet by mouth daily.    . NONFORMULARY OR COMPOUNDED ITEM Apply 2 sprays topically as directed. Testosterone Topical  Preparation 100 mg (Compounded by [facility])  Apply 2 clicks (50 mg) daily    . prochlorperazine (COMPAZINE) 10 MG tablet Take 1 tablet (10 mg total) by mouth every 6 (six) hours as needed. 30 tablet 2  . ramipril (ALTACE) 10 MG capsule TAKE 1 CAPSULE BY MOUTH EVERY DAY (Patient taking differently: Take 10 mg by mouth daily. ) 90 capsule 3  . traMADol (ULTRAM) 50 MG tablet Take 1 tablet (50 mg total) by mouth every 6 (six) hours as needed (mild pain). 40 tablet 0  . warfarin (COUMADIN) 10 MG tablet TAKE 1 TO 1 AND 1/2 TABLETS DAILY AS DIRECTED BY COUMADIN CLINIC (Patient taking differently: Take 10-15 mg by mouth See admin instructions. Take 1 tablet (10 mg) by mouth in the evening on Sundays, Tuesdays, Wednesdays, Thursdays, Fridays & Saturday. Take 1.5 tablets (15 mg) by mouth in the evening on Mondays only.) 135 tablet 1   Current Facility-Administered Medications  Medication Dose Route Frequency Provider Last Rate Last Admin  . cyanocobalamin ((VITAMIN B-12)) injection 1,000 mcg  1,000 mcg Intramuscular Once [name], [name], PA-C        SURGICAL HISTORY:  Past Surgical History:  Procedure Laterality Date  . CARDIAC CATHETERIZATION Bilateral 05/10/2007   Significant 1-vessel disease, severely dilated aortic root with moderate severe aortic insufficiency  . CARDIAC SURGERY    . CARDIOVERSION  08/02/2007   150 Joule biphasic shock with restoration of sinus rhythm. Heart rate 60.  . cataract surgery     . COLONOSCOPY WITH PROPOFOL N/A 12/15/2016   Procedure: COLONOSCOPY WITH PROPOFOL;  Surgeon: Hung, Patrick, MD;  Location: [facility];  Service: Endoscopy;  Laterality: N/A;  . EYE SURGERY    . INTERCOSTAL NERVE BLOCK Right 12/08/2019   Procedure: INTERCOSTAL NERVE BLOCK;  Surgeon:  Lightfoot, Harrell O, MD;  Location: [facility] OR;  Service: Thoracic;  Laterality: Right;  . NM MYOVIEW LTD  04/08/2007   No evidence of inducible myocardial ischemia  . NODE DISSECTION Right 12/08/2019   Procedure: NODE DISSECTION;  Surgeon: Lightfoot, Harrell O, MD;  Location: [facility] OR;  Service: Thoracic;  Laterality: Right;  . THYROIDECTOMY, PARTIAL    . torn meniscus in right knee surgery       REVIEW OF SYSTEMS:   Review of Systems  Constitutional: Negative for appetite change, chills, fatigue, fever and unexpected weight change.  HENT: Negative for mouth sores, nosebleeds, sore throat and trouble swallowing.   Eyes: Negative for eye problems and icterus.  Respiratory: Negative for cough, hemoptysis, shortness of breath and wheezing.   Cardiovascular: Negative for chest pain and leg swelling.  Gastrointestinal: Negative for abdominal pain, constipation, diarrhea, nausea and vomiting.  Genitourinary: Negative for bladder incontinence, difficulty urinating, dysuria, frequency and hematuria.   Musculoskeletal: Negative for   back pain, gait problem, neck pain and neck stiffness.  Skin: Negative for itching and rash.  Neurological: Negative for dizziness, extremity weakness, gait problem, headaches, light-headedness and seizures.  Hematological: Negative for adenopathy. Does not bruise/bleed easily.  Psychiatric/Behavioral: Negative for confusion, depression and sleep disturbance. The patient is not nervous/anxious.     PHYSICAL EXAMINATION:  Blood pressure 129/73, pulse 63, temperature (!) 96.6 F (35.9 C), temperature source Tympanic, resp. rate 17, height 5' 8" (1.727 m), weight 173 lb (78.5 kg), SpO2 100 %.  ECOG PERFORMANCE STATUS: 1 - Symptomatic but completely ambulatory  Physical Exam  Constitutional: Oriented to person, place, and time and well-developed, well-nourished, and in no distress. HENT:  Head: Normocephalic and atraumatic.  Mouth/Throat: Oropharynx is clear and moist. No  oropharyngeal exudate.  Eyes: Conjunctivae are normal. Right eye exhibits no discharge. Left eye exhibits no discharge. No scleral icterus.  Neck: Normal range of motion. Neck supple.  Cardiovascular: Normal rate, regular rhythm, normal heart sounds and intact distal pulses.   Pulmonary/Chest: Effort normal and breath sounds normal. No respiratory distress. No wheezes. No rales.  Abdominal: Soft. Bowel sounds are normal. Exhibits no distension and no mass. There is no tenderness.  Musculoskeletal: Normal range of motion. Exhibits no edema.  Lymphadenopathy:    No cervical adenopathy.  Neurological: Alert and oriented to person, place, and time. Exhibits normal muscle tone. Gait normal. Coordination normal.  Skin: Skin is warm and dry. No rash noted. Not diaphoretic. No erythema. No pallor.  Psychiatric: Mood, memory and judgment normal.  Vitals reviewed.  LABORATORY DATA: Lab Results  Component Value Date   WBC 5.5 01/28/2020   HGB 12.3 (L) 01/28/2020   HCT 38.2 (L) 01/28/2020   MCV 95.7 01/28/2020   PLT 158 01/28/2020      Chemistry      Component Value Date/Time   NA 140 01/28/2020 1324   NA 139 10/28/2019 1057   K 4.0 01/28/2020 1324   CL 107 01/28/2020 1324   CO2 27 01/28/2020 1324   BUN 12 01/28/2020 1324   BUN 10 10/28/2019 1057   CREATININE 0.84 01/28/2020 1324      Component Value Date/Time   CALCIUM 9.2 01/28/2020 1324   ALKPHOS 76 01/28/2020 1324   AST 30 01/28/2020 1324   ALT 18 01/28/2020 1324   BILITOT 1.2 01/28/2020 1324       RADIOGRAPHIC STUDIES:  DG Chest 2 View  Result Date: 01/23/2020 CLINICAL DATA:  Lung cancer, follow-up. Status post right upper lobectomy EXAM: CHEST - 2 VIEW COMPARISON:  12/18/2019 FINDINGS: Previous median sternotomy and CABG procedure. Right lung postoperative change with volume loss compatible with previous right upper lobectomy. No pneumothorax identified. No pleural effusion or edema. No airspace consolidation. Coarsened  interstitial changes are noted within both lungs compatible with mild emphysema. Visualized osseous structures are unremarkable. IMPRESSION: 1. No acute cardiopulmonary abnormalities. 2. Previous right upper lobectomy. Electronically Signed   By: Kerby Moors M.D.   On: 01/23/2020 09:54     ASSESSMENT/PLAN:  This is a very pleasant 74 year old Caucasian male recently diagnosed with a stage IIIb (T3, N2, M0) non-small cell lung cancer, adenocarcinoma. He presented with right upper lobe lung mass in addition to right hilar and mediastinal lymphadenopathy status post right upper lobectomy with lymph node sampling under the care of Dr. Kipp Brood on December 08, 2019.  Dr. Julien Nordmann had a lengthly discussion with the patient today about his current condition and treatment options. The patient was given  the option of standard adjuvant treatment with 4 cycles of Cisplatin 75 mg/m2 and Alimta 500 mg/m2 vs. Enrolling the clinical trial which consists of standard treatment with cisplatin and Alimta +/- immunotherapy with Keytruda 200 mg IV every 3 weeks.  The patient is interested in proceeding with standard treatment. He is not interested in enrolling the clinical trial. He is expected to start her first dose of this treatment on 02/04/20.  We discussed the adverse side effects of treatment including but not limited to alopecia, myelosuppression, nausea and vomiting, peripheral neuropathy, liver or renal dysfunction as well as immunotherapy mediated adverse effects.   I will arrange for the patient to have a chemoeducation class prior to receiving his first cycle of chemotherapy.   We will arrange for the patient to have a B12 injection while in the clinic today.     I sent prescriptions for 1 mg folic acid p.o. daily as well as Compazine 10 mg every 6 hours as needed for nausea. I have also sent a prescription for decadron 4 mg tablets. He was instructed to take 1 tablet twice a day the day before, the day of,  and the day after chemotherapy.   The patient will follow-up in 2 weeks for a one-week follow-up visit after completing his first cycle of chemotherapy.  The patient was advised to call immediately if he has any concerning symptoms in the interval. The patient voices understanding of current disease status and treatment options and is in agreement with the current care plan. All questions were answered. The patient knows to call the clinic with any problems, questions or concerns. We can certainly see the patient much sooner if necessary   Orders Placed This Encounter  Procedures  . CBC with Differential (Cancer Center Only)    Standing Status:   Standing    Number of Occurrences:   12    Standing Expiration Date:   01/27/2021  . CMP (Cancer Center only)    Standing Status:   Standing    Number of Occurrences:   12    Standing Expiration Date:   01/27/2021  . Magnesium    Standing Status:   Standing    Number of Occurrences:   12    Standing Expiration Date:   01/27/2021     [name] [name], PA-C 01/28/20  ADDENDUM: Hematology/Oncology Attending: Had a face-to-face encounter with the patient today.  I recommended his care plan.  This is a very pleasant 74 years old white male with stage IIIb (T3, N2, M0) non-small cell lung cancer, adenocarcinoma status post right upper lobectomy with lymph node sampling on December 08, 2019. The patient underwent molecular studies according to the alchemist clinical trial and he has no actionable mutations. I discussed with the patient in details his adjuvant treatment options including standard systemic chemotherapy with cisplatin 75 mg/M2 and Alimta 500 mg/M2 every 3 weeks for 4 cycles versus enrollment in the alliance clinical trial were The patient will be randomized to either standard systemic chemotherapy versus a combination of systemic chemotherapy and immunotherapy with pembrolizumab. The patient and his wife are not interested in  the clinical trial and he would like to proceed with the stander adjuvant systemic chemotherapy. I will arrange for the patient to start the first cycle of this treatment next week.  He will receive vitamin B12 injection today. We will send the prescription for folic acid, Compazine and Decadron to his pharmacy. The patient will come back for follow-up visit in   2 weeks for evaluation and management of any adverse effect of his treatment. He was advised to call immediately if he has any other concerning symptoms in the interval.  Disclaimer: This note was dictated with voice recognition software. Similar sounding words can inadvertently be transcribed and may be missed upon review. [name] K [name], MD 01/28/20  

## 2020-01-26 ENCOUNTER — Telehealth: Payer: Self-pay | Admitting: Family Medicine

## 2020-01-26 ENCOUNTER — Telehealth: Payer: Self-pay | Admitting: *Deleted

## 2020-01-26 DIAGNOSIS — C3491 Malignant neoplasm of unspecified part of right bronchus or lung: Secondary | ICD-10-CM

## 2020-01-26 LAB — POCT INR: INR: 3.3 — AB (ref 2.0–3.0)

## 2020-01-26 NOTE — Telephone Encounter (Signed)
Referral faxed and pt made aware of it

## 2020-01-26 NOTE — Telephone Encounter (Signed)
Referral placed.

## 2020-01-26 NOTE — Telephone Encounter (Signed)
Please advise 

## 2020-01-26 NOTE — Telephone Encounter (Signed)
Patient would like to be referred to a cancer specialist - At [facility] [phone] - For a second opinion

## 2020-01-26 NOTE — Telephone Encounter (Signed)
Alchemist and [id number] studies;  Wife called to ask if the molecular results were back from the study yet. Informed her all results have been received and forwarded to Dr. Julien Nordmann.  Informed her of results and that patient is eligible for a clinical trial. The trial ([id number]) is testing the addition of a type of drug called immunotherapy to the usual chemotherapy treatment for patient's type and stage of cancer. Informed wife that participation is completely voluntary. I offered to e-mail a copy of the consent form to patient so he can read before his clinic visit this Wednesday.  I can meet with them afterwards to answer any questions.  Wife agreed and she confirmed patient's e-mail address as correct in Epic.  Thanked patient's wife for her time and look forward to meeting her and her husband this Wednesday.  Copy of the ICF emailed to patient at [email or web address].  Foye Spurling, BSN, RN Clinical Research Nurse 01/26/2020 1:10 PM

## 2020-01-26 NOTE — Telephone Encounter (Signed)
[place] for referral to Oncology at [facility]- dx lung cancer

## 2020-01-27 ENCOUNTER — Other Ambulatory Visit: Payer: Medicare HMO

## 2020-01-27 ENCOUNTER — Ambulatory Visit (INDEPENDENT_AMBULATORY_CARE_PROVIDER_SITE_OTHER): Payer: Medicare HMO | Admitting: Pharmacist Clinician (PhC)/ Clinical Pharmacy Specialist

## 2020-01-27 ENCOUNTER — Ambulatory Visit: Payer: Medicare HMO | Admitting: Physician Assistant

## 2020-01-27 DIAGNOSIS — Z79899 Other long term (current) drug therapy: Secondary | ICD-10-CM

## 2020-01-27 DIAGNOSIS — Z952 Presence of prosthetic heart valve: Secondary | ICD-10-CM

## 2020-01-27 DIAGNOSIS — I4891 Unspecified atrial fibrillation: Secondary | ICD-10-CM

## 2020-01-27 DIAGNOSIS — I9789 Other postprocedural complications and disorders of the circulatory system, not elsewhere classified: Secondary | ICD-10-CM

## 2020-01-28 ENCOUNTER — Ambulatory Visit: Payer: Medicare HMO

## 2020-01-28 ENCOUNTER — Other Ambulatory Visit: Payer: Self-pay | Admitting: Internal Medicine

## 2020-01-28 ENCOUNTER — Encounter: Payer: Self-pay | Admitting: Radiology

## 2020-01-28 ENCOUNTER — Inpatient Hospital Stay: Payer: Medicare HMO | Admitting: Physician Assistant

## 2020-01-28 ENCOUNTER — Encounter: Payer: Self-pay | Admitting: Physician Assistant

## 2020-01-28 ENCOUNTER — Inpatient Hospital Stay: Payer: Medicare HMO | Attending: Internal Medicine

## 2020-01-28 ENCOUNTER — Other Ambulatory Visit: Payer: Self-pay

## 2020-01-28 VITALS — BP 129/73 | HR 63 | Temp 96.6°F | Resp 17 | Ht 68.0 in | Wt 173.0 lb

## 2020-01-28 DIAGNOSIS — Z7189 Other specified counseling: Secondary | ICD-10-CM | POA: Diagnosis not present

## 2020-01-28 DIAGNOSIS — Z5111 Encounter for antineoplastic chemotherapy: Secondary | ICD-10-CM | POA: Insufficient documentation

## 2020-01-28 DIAGNOSIS — C3411 Malignant neoplasm of upper lobe, right bronchus or lung: Secondary | ICD-10-CM | POA: Insufficient documentation

## 2020-01-28 DIAGNOSIS — C3490 Malignant neoplasm of unspecified part of unspecified bronchus or lung: Secondary | ICD-10-CM | POA: Insufficient documentation

## 2020-01-28 DIAGNOSIS — Z0000 Encounter for general adult medical examination without abnormal findings: Secondary | ICD-10-CM | POA: Insufficient documentation

## 2020-01-28 DIAGNOSIS — C3491 Malignant neoplasm of unspecified part of right bronchus or lung: Secondary | ICD-10-CM

## 2020-01-28 LAB — CBC WITH DIFFERENTIAL (CANCER CENTER ONLY)
Abs Immature Granulocytes: 0.01 10*3/uL (ref 0.00–0.07)
Basophils Absolute: 0.1 10*3/uL (ref 0.0–0.1)
Basophils Relative: 1 %
Eosinophils Absolute: 0.2 10*3/uL (ref 0.0–0.5)
Eosinophils Relative: 3 %
HCT: 38.2 % — ABNORMAL LOW (ref 39.0–52.0)
Hemoglobin: 12.3 g/dL — ABNORMAL LOW (ref 13.0–17.0)
Immature Granulocytes: 0 %
Lymphocytes Relative: 23 %
Lymphs Abs: 1.3 10*3/uL (ref 0.7–4.0)
MCH: 30.8 pg (ref 26.0–34.0)
MCHC: 32.2 g/dL (ref 30.0–36.0)
MCV: 95.7 fL (ref 80.0–100.0)
Monocytes Absolute: 0.4 10*3/uL (ref 0.1–1.0)
Monocytes Relative: 7 %
Neutro Abs: 3.7 10*3/uL (ref 1.7–7.7)
Neutrophils Relative %: 66 %
Platelet Count: 158 10*3/uL (ref 150–400)
RBC: 3.99 MIL/uL — ABNORMAL LOW (ref 4.22–5.81)
RDW: 13.5 % (ref 11.5–15.5)
WBC Count: 5.5 10*3/uL (ref 4.0–10.5)
nRBC: 0 % (ref 0.0–0.2)

## 2020-01-28 LAB — CMP (CANCER CENTER ONLY)
ALT: 18 U/L (ref 0–44)
AST: 30 U/L (ref 15–41)
Albumin: 3.7 g/dL (ref 3.5–5.0)
Alkaline Phosphatase: 76 U/L (ref 38–126)
Anion gap: 6 (ref 5–15)
BUN: 12 mg/dL (ref 8–23)
CO2: 27 mmol/L (ref 22–32)
Calcium: 9.2 mg/dL (ref 8.9–10.3)
Chloride: 107 mmol/L (ref 98–111)
Creatinine: 0.84 mg/dL (ref 0.61–1.24)
GFR, Estimated: >60 mL/min (ref >60)
Glucose, Bld: 103 mg/dL — ABNORMAL HIGH (ref 70–99)
Potassium: 4 mmol/L (ref 3.5–5.1)
Sodium: 140 mmol/L (ref 135–145)
Total Bilirubin: 1.2 mg/dL (ref 0.3–1.2)
Total Protein: 6.8 g/dL (ref 6.5–8.1)

## 2020-01-28 LAB — RESEARCH LABS

## 2020-01-28 MED ORDER — PROCHLORPERAZINE MALEATE 10 MG PO TABS: 10.0000 mg | ORAL_TABLET | Freq: Four times a day (QID) | ORAL | 2 refills | Status: DC | PRN

## 2020-01-28 MED ORDER — FOLIC ACID 1 MG PO TABS: 1.0000 mg | ORAL_TABLET | Freq: Every day | ORAL | 2 refills | Status: DC

## 2020-01-28 MED ORDER — DEXAMETHASONE 4 MG PO TABS: ORAL_TABLET | ORAL | 2 refills | Status: DC

## 2020-01-28 MED ORDER — CYANOCOBALAMIN 1000 MCG/ML IJ SOLN
INTRAMUSCULAR | Status: AC
  Filled 2020-01-28: qty 1

## 2020-01-28 MED ORDER — CYANOCOBALAMIN 1000 MCG/ML IJ SOLN
1000.0000 ug | Freq: Once | INTRAMUSCULAR | Status: AC
  Administered 2020-01-28: 1000 ug via INTRAMUSCULAR

## 2020-01-28 NOTE — Progress Notes (Signed)
START ON PATHWAY REGIMEN - Non-Small Cell Lung     A cycle is every 21 days:     Pemetrexed      Cisplatin   **Always confirm dose/schedule in your pharmacy ordering system**  Patient Characteristics: Postoperative without Neoadjuvant Therapy (Pathologic Staging), Stage III, Adjuvant Chemotherapy, Nonsquamous Cell Therapeutic Status: Postoperative without Neoadjuvant Therapy (Pathologic Staging) AJCC T Category: pT3 AJCC N Category: pN2 AJCC M Category: cM0 AJCC 8 Stage Grouping: IIIB Histology: Nonsquamous Cell Intent of Therapy: Curative Intent, Discussed with Patient

## 2020-01-28 NOTE — Patient Instructions (Signed)
Summary:  -There are two main categories of lung cancer, they are named based on the size of the cancer cell. One is called Non-Small cell lung cancer. The other type is Small Cell Lung Cancer -The sample (biopsy) that they took of your tumor was consistent with a subtype of Non-small cell lung cancer called Adenocarcinoma. This is the most common type of lung cancer.  -We covered a lot of important information at your appointment today regarding what the treatment plan is moving forward. Here are the the main points that were discussed at your office visit with [place] today:  -The treatment that you will receive consists of two chemotherapy drugs, called Cisplatin and Alimta (also called Pemetrexed). -We are planning on starting your treatment next week on 02/04/20 but before your start your treatment, I would like you to attend a Chemotherapy Education Class. This involves having you sit down with one of our nurse educators. She will discuss with your one-on-one more details about your treatment as well as general information about resources here at the cancer center.  -Your treatment will be given once every 3 weeks. We will check your labs once a week just to make sure that important components of your blood are in an acceptable range -The plan is to complete 4 rounds of treatment.   Medications:  -I have sent a few important medication prescriptions to your pharmacy.  -Compazine was sent to your pharmacy. This medication is for nausea. You may take this every 6 hours as needed if you feel nauseous.  -I have also sent a prescription for 1 mg of folic acid to your pharmacy. We need you to take 1 tablet every day.  -We will administer vitamin B12 every 9 weeks while you are here in the clinic. You have received your first dose today.  -I will also send a prescription for decadron to your pharmacy to take 1 tablet twice a day the day before, the day of, and the day after chemotherapy.    Follow up:   -We will see you back for a follow up visit 1 week after your first treatment to see how it went and help manage any side effects of treatment that you may have   -If you need to reach [place] at any time, the main office number to the cancer center is [phone], when you call, ask to speak to either Cassie's or Dr. Worthy Flank nurse.

## 2020-01-28 NOTE — Research (Signed)
[id number] (ALCHEMIST) GENETIC TESTING FOR PATIENTS WITH RESECTABLE or RESECTED LUNG CANCER  01/28/20     1:30PM  VISIT: Met with patient and his wife for 10 minutes in the med onc waiting area to introduce myself and give questionnaires to patient. I collected completed questionnaires from patient after visit with Cassie, PA. Thanked patient for his time.  PLAN: I plan on either calling patient and his wife or meeting patient during first infusion to discuss DCP. I also need to call to clarify some answers of time on ALCHEMIST questionnaires.   Carol Ada, RT(R)(T) Clinical Research Coordinator

## 2020-01-29 ENCOUNTER — Encounter: Payer: Self-pay | Admitting: Radiology

## 2020-01-29 DIAGNOSIS — C3490 Malignant neoplasm of unspecified part of unspecified bronchus or lung: Secondary | ICD-10-CM

## 2020-01-29 NOTE — Research (Unsigned)
DCP-001: Use of a Clinical Trial Screening Tool to Address Cancer Health Disparities in the [place] Program ([facility])   01/29/20    10:45 AM  PHONE CONSENT: Spoke with Liliane Bade wife, [name], concerning DCP for 20 minutes. Explained the reason I was calling was to discuss a data collection study. WereviewedtheDCP-001 phone consent script (dated 06/23/2) in full, including the voluntary nature of participation, the project purpose, risks and benefits, and who will see the provided information. [name] verbalizes understanding that this study is a one-time consent for collection of demographic variables and that no patient identifiers are being reported.Upon completion of review,patientverbalizes a desire to voluntarily participate in DCP-001. All consent and authorization forms were signed and dated bymyself as the person obtaining verbal consent. I enrolled patient in OPEN and he was given PID: .   DCP-001 WORKSHEET:After informed consent was obtained, the questions from the DCP-001 Worksheet were askedin order to collect ethnicity, language, literacy, education, employment, income and smoking history not available in the electronic medical record. Patient was thanked for their time and willingness to participate in this clinical study.   [place] RT(R)(T) Scientist, physiological

## 2020-01-30 ENCOUNTER — Telehealth: Payer: Self-pay

## 2020-01-30 ENCOUNTER — Other Ambulatory Visit: Payer: Self-pay

## 2020-01-30 ENCOUNTER — Inpatient Hospital Stay: Payer: Medicare HMO

## 2020-01-30 DIAGNOSIS — R69 Illness, unspecified: Secondary | ICD-10-CM | POA: Diagnosis not present

## 2020-01-30 NOTE — Telephone Encounter (Signed)
-----   Message from Ardeen Garland, RN sent at 01/29/2020  1:11 PM EDT ----- Regarding: RE: Questions concerning vitamins during treatment Andrius Andrepont ,can you call the pt what supplement he is taking ? diane ----- Message ----- From: Cathlean Cower, RN Sent: 01/29/2020  12:07 PM EDT To: Ardeen Garland, RN Subject: RE: Questions concerning vitamins during tre#  [name],  He is not enrolling on the clinical trial. So I guess it would just be up to Dr. Julien Nordmann about which supplements are ok to take with his treatment?  Thanks,  C[place]o ----- Message ----- From: Ardeen Garland, RN Sent: 01/29/2020  12:03 PM EDT To: Cathlean Cower, RN Subject: FW: Questions concerning vitamins during tre#  Cameo ,can you call him about this? ----- Message ----- From: Rober Minion, RT (T) Sent: 01/29/2020  10:56 AM EDT To: Curt Bears, MD, Ardeen Garland, RN Subject: Questions concerning vitamins during treatme#  Good Morning,  I just finished speaking with [name], Mr. Vejar's wife and she was asking if certain vitamins he is currently taking could be continued throughout his treatment. I was unsure of the answer to the question, so I wanted to message you and see if you could by chance call her and let her know. I appreciate your time!  Thanks, Carol Ada, RT(R)(T)Clinical Research Coordinator

## 2020-01-30 NOTE — Telephone Encounter (Signed)
There are no new supplements they have or are introducing. Pt wife advised he can continue but if it isn't necessary for him to take, he can hold them while he's in tx.

## 2020-01-31 DIAGNOSIS — G4733 Obstructive sleep apnea (adult) (pediatric): Secondary | ICD-10-CM | POA: Diagnosis not present

## 2020-02-02 DIAGNOSIS — Z87891 Personal history of nicotine dependence: Secondary | ICD-10-CM | POA: Diagnosis not present

## 2020-02-02 DIAGNOSIS — Z888 Allergy status to other drugs, medicaments and biological substances status: Secondary | ICD-10-CM | POA: Diagnosis not present

## 2020-02-02 DIAGNOSIS — Z880 Allergy status to penicillin: Secondary | ICD-10-CM | POA: Diagnosis not present

## 2020-02-02 DIAGNOSIS — C7801 Secondary malignant neoplasm of right lung: Secondary | ICD-10-CM | POA: Diagnosis not present

## 2020-02-02 DIAGNOSIS — C3411 Malignant neoplasm of upper lobe, right bronchus or lung: Secondary | ICD-10-CM | POA: Diagnosis not present

## 2020-02-02 DIAGNOSIS — Z902 Acquired absence of lung [part of]: Secondary | ICD-10-CM | POA: Diagnosis not present

## 2020-02-02 DIAGNOSIS — C771 Secondary and unspecified malignant neoplasm of intrathoracic lymph nodes: Secondary | ICD-10-CM | POA: Diagnosis not present

## 2020-02-02 DIAGNOSIS — Z887 Allergy status to serum and vaccine status: Secondary | ICD-10-CM | POA: Diagnosis not present

## 2020-02-02 NOTE — Progress Notes (Signed)
Pharmacist Chemotherapy Monitoring - Initial Assessment    Anticipated start date: 02/06/20   Regimen:  . Are orders appropriate based on the patient's diagnosis, regimen, and cycle? Yes . Does the plan date match the patient's scheduled date? Yes . Is the sequencing of drugs appropriate? Yes . Are the premedications appropriate for the patient's regimen? Yes . Prior Authorization for treatment is: Approved o If applicable, is the correct biosimilar selected based on the patient's insurance? not applicable  Organ Function and Labs: Marland Kitchen Are dose adjustments needed based on the patient's renal function, hepatic function, or hematologic function? No . Are appropriate labs ordered prior to the start of patient's treatment? Yes . Other organ system assessment, if indicated: N/A . The following baseline labs, if indicated, have been ordered: N/A  Dose Assessment: . Are the drug doses appropriate? Yes . Are the following correct: o Drug concentrations Yes o IV fluid compatible with drug Yes o Administration routes Yes o Timing of therapy Yes . If applicable, does the patient have documented access for treatment and/or plans for port-a-cath placement? no . If applicable, have lifetime cumulative doses been properly documented and assessed? yes Lifetime Dose Tracking  No doses have been documented on this patient for the following tracked chemicals: Doxorubicin, Epirubicin, Idarubicin, Daunorubicin, Mitoxantrone, Bleomycin, Oxaliplatin, Carboplatin, Liposomal Doxorubicin  o   Toxicity Monitoring/Prevention: . The patient has the following take home antiemetics prescribed: Prochlorperazine . The patient has the following take home medications prescribed: B12 for pemetrexed and folic acid for pemetrexed . Medication allergies and previous infusion related reactions, if applicable, have been reviewed and addressed. Yes . The patient's current medication list has been assessed for drug-drug  interactions with their chemotherapy regimen. no significant drug-drug interactions were identified on review.  Order Review: . Are the treatment plan orders signed? Yes . Is the patient scheduled to see a provider prior to their treatment? Yes  I verify that I have reviewed each item in the above checklist and answered each question accordingly.   Kennith Center, Pharm.D., CPP 02/02/2020@3 :11 PM

## 2020-02-03 ENCOUNTER — Telehealth: Payer: Self-pay | Admitting: Physician Assistant

## 2020-02-03 NOTE — Telephone Encounter (Signed)
Called and left msg about appts.

## 2020-02-04 ENCOUNTER — Inpatient Hospital Stay: Payer: Medicare HMO | Admitting: Physician Assistant

## 2020-02-04 ENCOUNTER — Inpatient Hospital Stay: Payer: Medicare HMO

## 2020-02-04 DIAGNOSIS — R69 Illness, unspecified: Secondary | ICD-10-CM | POA: Diagnosis not present

## 2020-02-05 ENCOUNTER — Inpatient Hospital Stay: Payer: Medicare HMO

## 2020-02-05 ENCOUNTER — Other Ambulatory Visit: Payer: Self-pay

## 2020-02-05 DIAGNOSIS — C3491 Malignant neoplasm of unspecified part of right bronchus or lung: Secondary | ICD-10-CM

## 2020-02-05 DIAGNOSIS — Z5111 Encounter for antineoplastic chemotherapy: Secondary | ICD-10-CM | POA: Diagnosis not present

## 2020-02-05 DIAGNOSIS — C3411 Malignant neoplasm of upper lobe, right bronchus or lung: Secondary | ICD-10-CM | POA: Diagnosis not present

## 2020-02-05 DIAGNOSIS — C771 Secondary and unspecified malignant neoplasm of intrathoracic lymph nodes: Secondary | ICD-10-CM | POA: Diagnosis not present

## 2020-02-05 LAB — CBC WITH DIFFERENTIAL (CANCER CENTER ONLY)
Abs Immature Granulocytes: 0.01 10*3/uL (ref 0.00–0.07)
Basophils Absolute: 0 10*3/uL (ref 0.0–0.1)
Basophils Relative: 0 %
Eosinophils Absolute: 0 10*3/uL (ref 0.0–0.5)
Eosinophils Relative: 1 %
HCT: 38.8 % — ABNORMAL LOW (ref 39.0–52.0)
Hemoglobin: 12.6 g/dL — ABNORMAL LOW (ref 13.0–17.0)
Immature Granulocytes: 0 %
Lymphocytes Relative: 10 %
Lymphs Abs: 0.5 10*3/uL — ABNORMAL LOW (ref 0.7–4.0)
MCH: 31 pg (ref 26.0–34.0)
MCHC: 32.5 g/dL (ref 30.0–36.0)
MCV: 95.6 fL (ref 80.0–100.0)
Monocytes Absolute: 0.1 10*3/uL (ref 0.1–1.0)
Monocytes Relative: 2 %
Neutro Abs: 4.8 10*3/uL (ref 1.7–7.7)
Neutrophils Relative %: 87 %
Platelet Count: 165 10*3/uL (ref 150–400)
RBC: 4.06 MIL/uL — ABNORMAL LOW (ref 4.22–5.81)
RDW: 13.4 % (ref 11.5–15.5)
WBC Count: 5.5 10*3/uL (ref 4.0–10.5)
nRBC: 0 % (ref 0.0–0.2)

## 2020-02-05 LAB — CMP (CANCER CENTER ONLY)
ALT: 21 U/L (ref 0–44)
AST: 30 U/L (ref 15–41)
Albumin: 3.9 g/dL (ref 3.5–5.0)
Alkaline Phosphatase: 80 U/L (ref 38–126)
Anion gap: 5 (ref 5–15)
BUN: 12 mg/dL (ref 8–23)
CO2: 27 mmol/L (ref 22–32)
Calcium: 9 mg/dL (ref 8.9–10.3)
Chloride: 106 mmol/L (ref 98–111)
Creatinine: 0.8 mg/dL (ref 0.61–1.24)
GFR, Estimated: >60 mL/min (ref >60)
Glucose, Bld: 107 mg/dL — ABNORMAL HIGH (ref 70–99)
Potassium: 4.5 mmol/L (ref 3.5–5.1)
Sodium: 138 mmol/L (ref 135–145)
Total Bilirubin: 1.1 mg/dL (ref 0.3–1.2)
Total Protein: 7.1 g/dL (ref 6.5–8.1)

## 2020-02-05 LAB — MAGNESIUM: Magnesium: 1.9 mg/dL (ref 1.7–2.4)

## 2020-02-06 ENCOUNTER — Other Ambulatory Visit: Payer: Self-pay

## 2020-02-06 ENCOUNTER — Inpatient Hospital Stay: Payer: Medicare HMO

## 2020-02-06 VITALS — BP 123/66 | HR 65 | Temp 98.0°F | Resp 16

## 2020-02-06 DIAGNOSIS — C3490 Malignant neoplasm of unspecified part of unspecified bronchus or lung: Secondary | ICD-10-CM

## 2020-02-06 DIAGNOSIS — Z5111 Encounter for antineoplastic chemotherapy: Secondary | ICD-10-CM | POA: Diagnosis not present

## 2020-02-06 DIAGNOSIS — C3411 Malignant neoplasm of upper lobe, right bronchus or lung: Secondary | ICD-10-CM | POA: Diagnosis not present

## 2020-02-06 MED ORDER — PALONOSETRON HCL INJECTION 0.25 MG/5ML
0.2500 mg | Freq: Once | INTRAVENOUS | Status: AC
  Administered 2020-02-06: 0.25 mg via INTRAVENOUS

## 2020-02-06 MED ORDER — SODIUM CHLORIDE 0.9 % IV SOLN
10.0000 mg | Freq: Once | INTRAVENOUS | Status: AC
  Administered 2020-02-06: 10 mg via INTRAVENOUS
  Filled 2020-02-06: qty 10

## 2020-02-06 MED ORDER — SODIUM CHLORIDE 0.9 % IV SOLN
Freq: Once | INTRAVENOUS | Status: AC
  Filled 2020-02-06: qty 250

## 2020-02-06 MED ORDER — SODIUM CHLORIDE 0.9 % IV SOLN
150.0000 mg | Freq: Once | INTRAVENOUS | Status: AC
  Administered 2020-02-06: 150 mg via INTRAVENOUS
  Filled 2020-02-06: qty 150

## 2020-02-06 MED ORDER — PALONOSETRON HCL INJECTION 0.25 MG/5ML
INTRAVENOUS | Status: AC
  Filled 2020-02-06: qty 5

## 2020-02-06 MED ORDER — SODIUM CHLORIDE 0.9 % IV SOLN
75.0000 mg/m2 | Freq: Once | INTRAVENOUS | Status: AC
  Administered 2020-02-06: 146 mg via INTRAVENOUS
  Filled 2020-02-06: qty 146

## 2020-02-06 MED ORDER — SODIUM CHLORIDE 0.9 % IV SOLN
500.0000 mg/m2 | Freq: Once | INTRAVENOUS | Status: AC
  Administered 2020-02-06: 1000 mg via INTRAVENOUS
  Filled 2020-02-06: qty 40

## 2020-02-06 MED ORDER — SODIUM CHLORIDE 0.9 % IV SOLN
Freq: Once | INTRAVENOUS | Status: AC
  Filled 2020-02-06: qty 10

## 2020-02-06 NOTE — Patient Instructions (Signed)
[facility] Discharge Instructions for Patients Receiving Chemotherapy  Today you received the following chemotherapy agents pemetrexed, cisplatin  To help prevent nausea and vomiting after your treatment, we encourage you to take your nausea medication as directed.   If you develop nausea and vomiting that is not controlled by your nausea medication, call the clinic.   BELOW ARE SYMPTOMS THAT SHOULD BE REPORTED IMMEDIATELY:  *FEVER GREATER THAN 100.5 F  *CHILLS WITH OR WITHOUT FEVER  NAUSEA AND VOMITING THAT IS NOT CONTROLLED WITH YOUR NAUSEA MEDICATION  *UNUSUAL SHORTNESS OF BREATH  *UNUSUAL BRUISING OR BLEEDING  TENDERNESS IN MOUTH AND THROAT WITH OR WITHOUT PRESENCE OF ULCERS  *URINARY PROBLEMS  *BOWEL PROBLEMS  UNUSUAL RASH Items with * indicate a potential emergency and should be followed up as soon as possible.  Feel free to call the clinic should you have any questions or concerns. The clinic phone number is [phone].  Please show the [place] at check-in to the Emergency Department and triage nurse.  Pemetrexed injection What is this medicine? PEMETREXED (PEM e TREX ed) is a chemotherapy drug used to treat lung cancers like non-small cell lung cancer and mesothelioma. It may also be used to treat other cancers. This medicine may be used for other purposes; ask your health care provider or pharmacist if you have questions. COMMON BRAND NAME(S): Alimta What should I tell my health care provider before I take this medicine? They need to know if you have any of these conditions:  infection (especially a virus infection such as chickenpox, cold sores, or herpes)  kidney disease  low blood counts, like low white cell, platelet, or red cell counts  lung or breathing disease, like asthma  radiation therapy  an unusual or allergic reaction to pemetrexed, other medicines, foods, dyes, or preservative  pregnant or trying to get  pregnant  breast-feeding How should I use this medicine? This drug is given as an infusion into a vein. It is administered in a hospital or clinic by a specially trained health care professional. Talk to your pediatrician regarding the use of this medicine in children. Special care may be needed. Overdosage: If you think you have taken too much of this medicine contact a poison control center or emergency room at once. NOTE: This medicine is only for you. Do not share this medicine with others. What if I miss a dose? It is important not to miss your dose. Call your doctor or health care professional if you are unable to keep an appointment. What may interact with this medicine? This medicine may interact with the following medications:  Ibuprofen This list may not describe all possible interactions. Give your health care provider a list of all the medicines, herbs, non-prescription drugs, or dietary supplements you use. Also tell them if you smoke, drink alcohol, or use illegal drugs. Some items may interact with your medicine. What should I watch for while using this medicine? Visit your doctor for checks on your progress. This drug may make you feel generally unwell. This is not uncommon, as chemotherapy can affect healthy cells as well as cancer cells. Report any side effects. Continue your course of treatment even though you feel ill unless your doctor tells you to stop. In some cases, you may be given additional medicines to help with side effects. Follow all directions for their use. Call your doctor or health care professional for advice if you get a fever, chills or sore throat, or other  symptoms of a cold or flu. Do not treat yourself. This drug decreases your body's ability to fight infections. Try to avoid being around people who are sick. This medicine may increase your risk to bruise or bleed. Call your doctor or health care professional if you notice any unusual bleeding. Be careful  brushing and flossing your teeth or using a toothpick because you may get an infection or bleed more easily. If you have any dental work done, tell your dentist you are receiving this medicine. Avoid taking products that contain aspirin, acetaminophen, ibuprofen, naproxen, or ketoprofen unless instructed by your doctor. These medicines may hide a fever. Call your doctor or health care professional if you get diarrhea or mouth sores. Do not treat yourself. To protect your kidneys, drink water or other fluids as directed while you are taking this medicine. Do not become pregnant while taking this medicine or for 6 months after stopping it. Women should inform their doctor if they wish to become pregnant or think they might be pregnant. Men should not father a child while taking this medicine and for 3 months after stopping it. This may interfere with the ability to father a child. You should talk to your doctor or health care professional if you are concerned about your fertility. There is a potential for serious side effects to an unborn child. Talk to your health care professional or pharmacist for more information. Do not breast-feed an infant while taking this medicine or for 1 week after stopping it. What side effects may I notice from receiving this medicine? Side effects that you should report to your doctor or health care professional as soon as possible:  allergic reactions like skin rash, itching or hives, swelling of the face, lips, or tongue  breathing problems  redness, blistering, peeling or loosening of the skin, including inside the mouth  signs and symptoms of bleeding such as bloody or black, tarry stools; red or dark-brown urine; spitting up blood or brown material that looks like coffee grounds; red spots on the skin; unusual bruising or bleeding from the eye, gums, or nose  signs and symptoms of infection like fever or chills; cough; sore throat; pain or trouble passing  urine  signs and symptoms of kidney injury like trouble passing urine or change in the amount of urine  signs and symptoms of liver injury like dark yellow or brown urine; general ill feeling or flu-like symptoms; light-colored stools; loss of appetite; nausea; right upper belly pain; unusually weak or tired; yellowing of the eyes or skin Side effects that usually do not require medical attention (report to your doctor or health care professional if they continue or are bothersome):  constipation  mouth sores  nausea, vomiting  unusually weak or tired This list may not describe all possible side effects. Call your doctor for medical advice about side effects. You may report side effects to FDA at 1-800-FDA-1088. Where should I keep my medicine? This drug is given in a hospital or clinic and will not be stored at home. NOTE: This sheet is a summary. It may not cover all possible information. If you have questions about this medicine, talk to your doctor, pharmacist, or health care provider.  2020 Elsevier/Gold Standard (2017-05-16 16:11:33)  Cisplatin injection What is this medicine? CISPLATIN (SIS pla tin) is a chemotherapy drug. It targets fast dividing cells, like cancer cells, and causes these cells to die. This medicine is used to treat many types of cancer like bladder, ovarian,  and testicular cancers. This medicine may be used for other purposes; ask your health care provider or pharmacist if you have questions. COMMON BRAND NAME(S): Platinol, Platinol -AQ What should I tell my health care provider before I take this medicine? They need to know if you have any of these conditions:  eye disease, vision problems  hearing problems  kidney disease  low blood counts, like white cells, platelets, or red blood cells  tingling of the fingers or toes, or other nerve disorder  an unusual or allergic reaction to cisplatin, carboplatin, oxaliplatin, other medicines, foods, dyes, or  preservatives  pregnant or trying to get pregnant  breast-feeding How should I use this medicine? This drug is given as an infusion into a vein. It is administered in a hospital or clinic by a specially trained health care professional. Talk to your pediatrician regarding the use of this medicine in children. Special care may be needed. Overdosage: If you think you have taken too much of this medicine contact a poison control center or emergency room at once. NOTE: This medicine is only for you. Do not share this medicine with others. What if I miss a dose? It is important not to miss a dose. Call your doctor or health care professional if you are unable to keep an appointment. What may interact with this medicine? This medicine may interact with the following medications:  foscarnet  certain antibiotics like amikacin, gentamicin, neomycin, polymyxin B, streptomycin, tobramycin, vancomycin This list may not describe all possible interactions. Give your health care provider a list of all the medicines, herbs, non-prescription drugs, or dietary supplements you use. Also tell them if you smoke, drink alcohol, or use illegal drugs. Some items may interact with your medicine. What should I watch for while using this medicine? Your condition will be monitored carefully while you are receiving this medicine. You will need important blood work done while you are taking this medicine. This drug may make you feel generally unwell. This is not uncommon, as chemotherapy can affect healthy cells as well as cancer cells. Report any side effects. Continue your course of treatment even though you feel ill unless your doctor tells you to stop. This medicine may increase your risk of getting an infection. Call your healthcare professional for advice if you get a fever, chills, or sore throat, or other symptoms of a cold or flu. Do not treat yourself. Try to avoid being around people who are sick. Avoid taking  medicines that contain aspirin, acetaminophen, ibuprofen, naproxen, or ketoprofen unless instructed by your healthcare professional. These medicines may hide a fever. This medicine may increase your risk to bruise or bleed. Call your doctor or health care professional if you notice any unusual bleeding. Be careful brushing and flossing your teeth or using a toothpick because you may get an infection or bleed more easily. If you have any dental work done, tell your dentist you are receiving this medicine. Do not become pregnant while taking this medicine or for 14 months after stopping it. Women should inform their healthcare professional if they wish to become pregnant or think they might be pregnant. Men should not father a child while taking this medicine and for 11 months after stopping it. There is potential for serious side effects to an unborn child. Talk to your healthcare professional for more information. Do not breast-feed an infant while taking this medicine. This medicine has caused ovarian failure in some women. This medicine may make it more difficult  to get pregnant. Talk to your healthcare professional if you are concerned about your fertility. This medicine has caused decreased sperm counts in some men. This may make it more difficult to father a child. Talk to your healthcare professional if you are concerned about your fertility. Drink fluids as directed while you are taking this medicine. This will help protect your kidneys. Call your doctor or health care professional if you get diarrhea. Do not treat yourself. What side effects may I notice from receiving this medicine? Side effects that you should report to your doctor or health care professional as soon as possible:  allergic reactions like skin rash, itching or hives, swelling of the face, lips, or tongue  blurred vision  changes in vision  decreased hearing or ringing of the ears  nausea, vomiting  pain, redness, or  irritation at site where injected  pain, tingling, numbness in the hands or feet  signs and symptoms of bleeding such as bloody or black, tarry stools; red or dark brown urine; spitting up blood or brown material that looks like coffee grounds; red spots on the skin; unusual bruising or bleeding from the eyes, gums, or nose  signs and symptoms of infection like fever; chills; cough; sore throat; pain or trouble passing urine  signs and symptoms of kidney injury like trouble passing urine or change in the amount of urine  signs and symptoms of low red blood cells or anemia such as unusually weak or tired; feeling faint or lightheaded; falls; breathing problems Side effects that usually do not require medical attention (report to your doctor or health care professional if they continue or are bothersome):  loss of appetite  mouth sores  muscle cramps This list may not describe all possible side effects. Call your doctor for medical advice about side effects. You may report side effects to FDA at 1-800-FDA-1088. Where should I keep my medicine? This drug is given in a hospital or clinic and will not be stored at home. NOTE: This sheet is a summary. It may not cover all possible information. If you have questions about this medicine, talk to your doctor, pharmacist, or health care provider.  2020 Elsevier/Gold Standard (2018-03-22 15:59:17)

## 2020-02-09 ENCOUNTER — Telehealth: Payer: Self-pay | Admitting: Cardiovascular Disease

## 2020-02-09 ENCOUNTER — Telehealth: Payer: Self-pay | Admitting: *Deleted

## 2020-02-09 ENCOUNTER — Ambulatory Visit (INDEPENDENT_AMBULATORY_CARE_PROVIDER_SITE_OTHER): Payer: Medicare HMO | Admitting: Cardiology

## 2020-02-09 DIAGNOSIS — Z79899 Other long term (current) drug therapy: Secondary | ICD-10-CM

## 2020-02-09 DIAGNOSIS — Z952 Presence of prosthetic heart valve: Secondary | ICD-10-CM

## 2020-02-09 LAB — POCT INR: INR: 5.5 — AB (ref 2.0–3.0)

## 2020-02-09 NOTE — Telephone Encounter (Signed)
Took critical INR of 5.5 from representative from [facility] monitoring service. Followed up with coumadin clinic to report, who were already aware and actively handling.

## 2020-02-09 NOTE — Telephone Encounter (Signed)
  Critical INR

## 2020-02-10 ENCOUNTER — Inpatient Hospital Stay: Payer: Medicare HMO

## 2020-02-10 ENCOUNTER — Encounter: Payer: Self-pay | Admitting: Internal Medicine

## 2020-02-10 ENCOUNTER — Inpatient Hospital Stay: Payer: Medicare HMO | Attending: Internal Medicine | Admitting: Internal Medicine

## 2020-02-10 ENCOUNTER — Other Ambulatory Visit: Payer: Self-pay

## 2020-02-10 VITALS — BP 126/64 | HR 62 | Temp 97.8°F | Resp 18 | Ht 68.0 in | Wt 171.8 lb

## 2020-02-10 DIAGNOSIS — C3491 Malignant neoplasm of unspecified part of right bronchus or lung: Secondary | ICD-10-CM

## 2020-02-10 DIAGNOSIS — Z79899 Other long term (current) drug therapy: Secondary | ICD-10-CM | POA: Diagnosis not present

## 2020-02-10 DIAGNOSIS — C3411 Malignant neoplasm of upper lobe, right bronchus or lung: Secondary | ICD-10-CM | POA: Diagnosis not present

## 2020-02-10 DIAGNOSIS — Z902 Acquired absence of lung [part of]: Secondary | ICD-10-CM | POA: Insufficient documentation

## 2020-02-10 DIAGNOSIS — Z5111 Encounter for antineoplastic chemotherapy: Secondary | ICD-10-CM | POA: Diagnosis not present

## 2020-02-10 DIAGNOSIS — Z5189 Encounter for other specified aftercare: Secondary | ICD-10-CM | POA: Insufficient documentation

## 2020-02-10 LAB — CBC WITH DIFFERENTIAL (CANCER CENTER ONLY)
Abs Immature Granulocytes: 0.01 10*3/uL (ref 0.00–0.07)
Basophils Absolute: 0 10*3/uL (ref 0.0–0.1)
Basophils Relative: 1 %
Eosinophils Absolute: 0.1 10*3/uL (ref 0.0–0.5)
Eosinophils Relative: 2 %
HCT: 40.5 % (ref 39.0–52.0)
Hemoglobin: 13.2 g/dL (ref 13.0–17.0)
Immature Granulocytes: 0 %
Lymphocytes Relative: 24 %
Lymphs Abs: 1.3 10*3/uL (ref 0.7–4.0)
MCH: 31.1 pg (ref 26.0–34.0)
MCHC: 32.6 g/dL (ref 30.0–36.0)
MCV: 95.5 fL (ref 80.0–100.0)
Monocytes Absolute: 0.1 10*3/uL (ref 0.1–1.0)
Monocytes Relative: 2 %
Neutro Abs: 3.8 10*3/uL (ref 1.7–7.7)
Neutrophils Relative %: 71 %
Platelet Count: 177 10*3/uL (ref 150–400)
RBC: 4.24 MIL/uL (ref 4.22–5.81)
RDW: 13.3 % (ref 11.5–15.5)
WBC Count: 5.3 10*3/uL (ref 4.0–10.5)
nRBC: 0 % (ref 0.0–0.2)

## 2020-02-10 LAB — CMP (CANCER CENTER ONLY)
ALT: 28 U/L (ref 0–44)
AST: 32 U/L (ref 15–41)
Albumin: 3.8 g/dL (ref 3.5–5.0)
Alkaline Phosphatase: 74 U/L (ref 38–126)
Anion gap: 9 (ref 5–15)
BUN: 14 mg/dL (ref 8–23)
CO2: 28 mmol/L (ref 22–32)
Calcium: 8.6 mg/dL — ABNORMAL LOW (ref 8.9–10.3)
Chloride: 103 mmol/L (ref 98–111)
Creatinine: 0.81 mg/dL (ref 0.61–1.24)
GFR, Estimated: >60 mL/min (ref >60)
Glucose, Bld: 109 mg/dL — ABNORMAL HIGH (ref 70–99)
Potassium: 3.7 mmol/L (ref 3.5–5.1)
Sodium: 140 mmol/L (ref 135–145)
Total Bilirubin: 1.4 mg/dL — ABNORMAL HIGH (ref 0.3–1.2)
Total Protein: 7 g/dL (ref 6.5–8.1)

## 2020-02-10 LAB — MAGNESIUM: Magnesium: 1.9 mg/dL (ref 1.7–2.4)

## 2020-02-10 NOTE — Progress Notes (Signed)
[facility] [phone]   [phone]  OFFICE PROGRESS NOTE  [name], MD [address] [place] [address]  DIAGNOSIS: Stage IIIb (T3, N2, M0) non-small cell lung cancer, adenocarcinoma presented with right upper lobe lung mass in addition to right hilar and mediastinal lymphadenopathy. Diagnosed in December 08, 2019.  Molecular Studies: Negative for actionable mutations  PDL1: 1-49%  PRIOR THERAPY:  1) Right upper lobectomy with lymph node sampling under the care of Dr. Kipp Brood on December 08, 2019.  CURRENT THERAPY: Adjuvant systemic chemotherapy with Cisplatin 75 mg/m2 and Alimta 500 mg/m2 IV every 3 weeks. First dose expected on 02/04/20.  INTERVAL HISTORY: Marcus [name] 74 y.o. male returns to the clinic today for follow-up visit accompanied by his wife.  The patient tolerated the first week of his treatment with cisplatin and Alimta fairly well except for few episodes of diarrhea yesterday which resolved.  He denied having any nausea, vomiting, or constipation.  He denied having any weight loss or night sweats.  He has no chest pain, shortness of breath, cough or hemoptysis.  He denied having any headache or visual changes.  He is here today for evaluation and repeat blood work.  MEDICAL HISTORY: Past Medical History:  Diagnosis Date  . Atrial fibrillation ([place])   . Dyspnea    with exertion   . Dysrhythmia   . Hypertension   . Obstructive sleep apnea 10/25/2007   cpap  . S/P AVR    2D ECHO, 04/25/2011 - EF >55%, Right ventricle-mild-moderately dilated  . Swelling of limb    LEA VENOUS, 08/22/2009 - no evidence of deep vein or superficial thrombosis; partially rupturing Baker's Cyst    ALLERGIES:  is allergic to amlodipine, tetanus toxoids, crestor [rosuvastatin], and penicillins.  MEDICATIONS:  Current Outpatient Medications  Medication Sig Dispense Refill  . acetaminophen (TYLENOL) 325 MG tablet Take 2  tablets (650 mg total) by mouth every 6 (six) hours as needed for mild pain.    Marland Kitchen ALPRAZolam (XANAX) 0.25 MG tablet Take 1 tablet (0.25 mg total) by mouth every 8 (eight) hours as needed for up to 20 doses for anxiety. 20 tablet 0  . aspirin EC 81 MG tablet Take 81 mg by mouth daily.    Marland Kitchen atorvastatin (LIPITOR) 80 MG tablet TAKE 1 TABLET BY MOUTH EVERY DAY (Patient taking differently: Take 80 mg by mouth every evening. ) 90 tablet 2  . Calcium Carb-Cholecalciferol (CALCIUM 600+D3 PO) Take 1 tablet by mouth daily.    . carboxymethylcellulose (REFRESH PLUS) 0.5 % SOLN Place 1 drop into both eyes daily as needed (dry eyes).     . CINNAMON PO Take 1,000 mg by mouth every evening.     . clindamycin (CLEOCIN) 300 MG capsule Take 2 tablets (600 mg total) 30-60 minutes prior to dental or medical procedure. (Patient taking differently: Take 600 mg by mouth See admin instructions. Take 2 tablets (600 mg total) 30-60 minutes prior to dental or medical procedure.) 8 capsule 1  . Coenzyme Q10 (COQ10 PO) Take 1 capsule by mouth daily.    Marland Kitchen dexamethasone (DECADRON) 4 MG tablet Please take 1 tablet twice daily the day before, the day of, and the day after chemotherapy 40 tablet 2  . fexofenadine (ALLEGRA) 180 MG tablet Take 180 mg by mouth every evening.    . folic acid (FOLVITE) 1 MG tablet Take 1 tablet (1 mg total) by mouth daily. 30 tablet  2  . Krill Oil 350 MG CAPS Take 350 mg by mouth every evening.    . Multiple Vitamins-Minerals (EYE SUPPORT PO) Take 1 tablet by mouth daily.    . NONFORMULARY OR COMPOUNDED ITEM Apply 2 sprays topically as directed. Testosterone Topical  Preparation 100 mg (Compounded by [place])  Apply 2 clicks (50 mg) daily    . prochlorperazine (COMPAZINE) 10 MG tablet Take 1 tablet (10 mg total) by mouth every 6 (six) hours as needed. 30 tablet 2  . ramipril (ALTACE) 10 MG capsule TAKE 1 CAPSULE BY MOUTH EVERY DAY (Patient taking differently: Take 10 mg by mouth  daily. ) 90 capsule 3  . traMADol (ULTRAM) 50 MG tablet Take 1 tablet (50 mg total) by mouth every 6 (six) hours as needed (mild pain). 40 tablet 0  . warfarin (COUMADIN) 10 MG tablet TAKE 1 TO 1 AND 1/2 TABLETS DAILY AS DIRECTED BY COUMADIN CLINIC (Patient taking differently: Take 10-15 mg by mouth See admin instructions. Take 1 tablet (10 mg) by mouth in the evening on Sundays, Tuesdays, Wednesdays, Thursdays, Fridays & Saturday. Take 1.5 tablets (15 mg) by mouth in the evening on Mondays only.) 135 tablet 1   No current facility-administered medications for this visit.    SURGICAL HISTORY:  Past Surgical History:  Procedure Laterality Date  . CARDIAC CATHETERIZATION Bilateral 05/10/2007   Significant 1-vessel disease, severely dilated aortic root with moderate severe aortic insufficiency  . CARDIAC SURGERY    . CARDIOVERSION  08/02/2007   150 Joule biphasic shock with restoration of sinus rhythm. Heart rate 60.  . cataract surgery     . COLONOSCOPY WITH PROPOFOL N/A 12/15/2016   Procedure: COLONOSCOPY WITH PROPOFOL;  Surgeon: Carol Ada, MD;  Location: [facility];  Service: Endoscopy;  Laterality: N/A;  . EYE SURGERY    . INTERCOSTAL NERVE BLOCK Right 12/08/2019   Procedure: INTERCOSTAL NERVE BLOCK;  Surgeon: [name], MD;  Location: [facility];  Service: Thoracic;  Laterality: Right;  . NM MYOVIEW LTD  04/08/2007   No evidence of inducible myocardial ischemia  . NODE DISSECTION Right 12/08/2019   Procedure: NODE DISSECTION;  Surgeon: [name], MD;  Location: [facility];  Service: Thoracic;  Laterality: Right;  . THYROIDECTOMY, PARTIAL    . torn meniscus in right knee surgery       REVIEW OF SYSTEMS:  A comprehensive review of systems was negative except for: Gastrointestinal: positive for diarrhea   PHYSICAL EXAMINATION: General appearance: alert, cooperative and no distress Head: Normocephalic, without obvious abnormality, atraumatic Neck: no adenopathy, no JVD,  supple, symmetrical, trachea midline and thyroid not enlarged, symmetric, no tenderness/mass/nodules Lymph nodes: Cervical, supraclavicular, and axillary nodes normal. Resp: clear to auscultation bilaterally Back: symmetric, no curvature. ROM normal. No CVA tenderness. Cardio: regular rate and rhythm, S1, S2 normal, no murmur, click, rub or gallop GI: soft, non-tender; bowel sounds normal; no masses,  no organomegaly Extremities: extremities normal, atraumatic, no cyanosis or edema  ECOG PERFORMANCE STATUS: 1 - Symptomatic but completely ambulatory  Blood pressure 126/64, pulse 62, temperature 97.8 F (36.6 C), temperature source Tympanic, resp. rate 18, height 5' 8"  (1.727 m), weight 171 lb 12.8 oz (77.9 kg), SpO2 100 %.  LABORATORY DATA: Lab Results  Component Value Date   WBC 5.3 02/10/2020   HGB 13.2 02/10/2020   HCT 40.5 02/10/2020   MCV 95.5 02/10/2020   PLT 177 02/10/2020      Chemistry  Component Value Date/Time   NA 138 02/05/2020 1414   NA 139 10/28/2019 1057   K 4.5 02/05/2020 1414   CL 106 02/05/2020 1414   CO2 27 02/05/2020 1414   BUN 12 02/05/2020 1414   BUN 10 10/28/2019 1057   CREATININE 0.80 02/05/2020 1414      Component Value Date/Time   CALCIUM 9.0 02/05/2020 1414   ALKPHOS 80 02/05/2020 1414   AST 30 02/05/2020 1414   ALT 21 02/05/2020 1414   BILITOT 1.1 02/05/2020 1414       RADIOGRAPHIC STUDIES: DG Chest 2 View  Result Date: 01/23/2020 CLINICAL DATA:  Lung cancer, follow-up. Status post right upper lobectomy EXAM: CHEST - 2 VIEW COMPARISON:  12/18/2019 FINDINGS: Previous median sternotomy and CABG procedure. Right lung postoperative change with volume loss compatible with previous right upper lobectomy. No pneumothorax identified. No pleural effusion or edema. No airspace consolidation. Coarsened interstitial changes are noted within both lungs compatible with mild emphysema. Visualized osseous structures are unremarkable. IMPRESSION: 1. No  acute cardiopulmonary abnormalities. 2. Previous right upper lobectomy. Electronically Signed   By: Kerby Moors M.D.   On: 01/23/2020 09:54    ASSESSMENT AND PLAN: This is a very pleasant 74 years old white male with a stage IIIb non-small cell lung cancer, adenocarcinoma presented with right upper lobe lung mass in addition to right hilar and mediastinal lymphadenopathy diagnosed in August 2021 with negative actionable mutation and PD-L1 expression in the range of 1-49%. The patient declined enrollment in the alliance clinical trial for treatment with systemic chemotherapy plus/minus immunotherapy. He is currently undergoing adjuvant systemic chemotherapy with cisplatin 75 mg/M2 and Alimta 500 mg/M2 every 3 weeks.  He started the first cycle of his treatment last week and tolerated it fairly well except for 1 day of diarrhea resolved. I recommended for the patient to continue his treatment as planned and he will come back for follow-up visit in 2 weeks for evaluation before starting cycle #2. The patient was advised to call immediately if he has any concerning symptoms in the interval. The patient voices understanding of current disease status and treatment options and is in agreement with the current care plan.  All questions were answered. The patient knows to call the clinic with any problems, questions or concerns. We can certainly see the patient much sooner if necessary.   Disclaimer: This note was dictated with voice recognition software. Similar sounding words can inadvertently be transcribed and may not be corrected upon review.

## 2020-02-16 ENCOUNTER — Ambulatory Visit (INDEPENDENT_AMBULATORY_CARE_PROVIDER_SITE_OTHER): Payer: Medicare HMO

## 2020-02-16 VITALS — Ht 68.0 in | Wt 171.0 lb

## 2020-02-16 DIAGNOSIS — Z0000 Encounter for general adult medical examination without abnormal findings: Secondary | ICD-10-CM

## 2020-02-16 NOTE — Patient Instructions (Signed)
Mr. Marcus [name] , Thank you for taking time to complete your Medicare Wellness Visit. I appreciate your ongoing commitment to your health goals. Please review the following plan we discussed and let me know if I can assist you in the future.   Screening recommendations/referrals: Colonoscopy: No longer required Recommended yearly ophthalmology/optometry visit for glaucoma screening and checkup Recommended yearly dental visit for hygiene and checkup  Vaccinations: Influenza vaccine: Up to date Pneumococcal vaccine: Completed vaccines Tdap vaccine: Allergic to vaccine Shingles vaccine: Completed vaccines Covid-19: Completed vaccines  Advanced directives: Please bring a copy for your chart  Conditions/risks identified: See problem list  Next appointment: Follow up in one year for your annual wellness visit.   Preventive Care 2 Years and Older, Male Preventive care refers to lifestyle choices and visits with your health care provider that can promote health and wellness. What does preventive care include?  A yearly physical exam. This is also called an annual well check.  Dental exams once or twice a year.  Routine eye exams. Ask your health care provider how often you should have your eyes checked.  Personal lifestyle choices, including:  Daily care of your teeth and gums.  Regular physical activity.  Eating a healthy diet.  Avoiding tobacco and drug use.  Limiting alcohol use.  Practicing safe sex.  Taking low doses of aspirin every day.  Taking vitamin and mineral supplements as recommended by your health care provider. What happens during an annual well check? The services and screenings done by your health care provider during your annual well check will depend on your age, overall health, lifestyle risk factors, and family history of disease. Counseling  Your health care provider may ask you questions about your:  Alcohol use.  Tobacco use.  Drug use.  Emotional  well-being.  Home and relationship well-being.  Sexual activity.  Eating habits.  History of falls.  Memory and ability to understand (cognition).  Work and work Statistician. Screening  You may have the following tests or measurements:  Height, weight, and BMI.  Blood pressure.  Lipid and cholesterol levels. These may be checked every 5 years, or more frequently if you are over 61 years old.  Skin check.  Lung cancer screening. You may have this screening every year starting at age 68 if you have a 30-pack-year history of smoking and currently smoke or have quit within the past 15 years.  Fecal occult blood test (FOBT) of the stool. You may have this test every year starting at age 55.  Flexible sigmoidoscopy or colonoscopy. You may have a sigmoidoscopy every 5 years or a colonoscopy every 10 years starting at age 34.  Prostate cancer screening. Recommendations will vary depending on your family history and other risks.  Hepatitis C blood test.  Hepatitis B blood test.  Sexually transmitted disease (STD) testing.  Diabetes screening. This is done by checking your blood sugar (glucose) after you have not eaten for a while (fasting). You may have this done every 1-3 years.  Abdominal aortic aneurysm (AAA) screening. You may need this if you are a current or former smoker.  Osteoporosis. You may be screened starting at age 62 if you are at high risk. Talk with your health care provider about your test results, treatment options, and if necessary, the need for more tests. Vaccines  Your health care provider may recommend certain vaccines, such as:  Influenza vaccine. This is recommended every year.  Tetanus, diphtheria, and acellular pertussis (Tdap, Td) vaccine. You  may need a Td booster every 10 years.  Zoster vaccine. You may need this after age 48.  Pneumococcal 13-valent conjugate (PCV13) vaccine. One dose is recommended after age 37.  Pneumococcal  polysaccharide (PPSV23) vaccine. One dose is recommended after age 102. Talk to your health care provider about which screenings and vaccines you need and how often you need them. This information is not intended to replace advice given to you by your health care provider. Make sure you discuss any questions you have with your health care provider. Document Released: 04/23/2015 Document Revised: 12/15/2015 Document Reviewed: 01/26/2015 Elsevier Interactive Patient Education  2017 [employer] Prevention in the Home Falls can cause injuries. They can happen to people of all ages. There are many things you can do to make your home safe and to help prevent falls. What can I do on the outside of my home?  Regularly fix the edges of walkways and driveways and fix any cracks.  Remove anything that might make you trip as you walk through a door, such as a raised step or threshold.  Trim any bushes or trees on the path to your home.  Use bright outdoor lighting.  Clear any walking paths of anything that might make someone trip, such as rocks or tools.  Regularly check to see if handrails are loose or broken. Make sure that both sides of any steps have handrails.  Any raised decks and porches should have guardrails on the edges.  Have any leaves, snow, or ice cleared regularly.  Use sand or salt on walking paths during winter.  Clean up any spills in your garage right away. This includes oil or grease spills. What can I do in the bathroom?  Use night lights.  Install grab bars by the toilet and in the tub and shower. Do not use towel bars as grab bars.  Use non-skid mats or decals in the tub or shower.  If you need to sit down in the shower, use a plastic, non-slip stool.  Keep the floor dry. Clean up any water that spills on the floor as soon as it happens.  Remove soap buildup in the tub or shower regularly.  Attach bath mats securely with double-sided non-slip rug  tape.  Do not have throw rugs and other things on the floor that can make you trip. What can I do in the bedroom?  Use night lights.  Make sure that you have a light by your bed that is easy to reach.  Do not use any sheets or blankets that are too big for your bed. They should not hang down onto the floor.  Have a firm chair that has side arms. You can use this for support while you get dressed.  Do not have throw rugs and other things on the floor that can make you trip. What can I do in the kitchen?  Clean up any spills right away.  Avoid walking on wet floors.  Keep items that you use a lot in easy-to-reach places.  If you need to reach something above you, use a strong step stool that has a grab bar.  Keep electrical cords out of the way.  Do not use floor polish or wax that makes floors slippery. If you must use wax, use non-skid floor wax.  Do not have throw rugs and other things on the floor that can make you trip. What can I do with my stairs?  Do not leave any items  on the stairs.  Make sure that there are handrails on both sides of the stairs and use them. Fix handrails that are broken or loose. Make sure that handrails are as long as the stairways.  Check any carpeting to make sure that it is firmly attached to the stairs. Fix any carpet that is loose or worn.  Avoid having throw rugs at the top or bottom of the stairs. If you do have throw rugs, attach them to the floor with carpet tape.  Make sure that you have a light switch at the top of the stairs and the bottom of the stairs. If you do not have them, ask someone to add them for you. What else can I do to help prevent falls?  Wear shoes that:  Do not have high heels.  Have rubber bottoms.  Are comfortable and fit you well.  Are closed at the toe. Do not wear sandals.  If you use a stepladder:  Make sure that it is fully opened. Do not climb a closed stepladder.  Make sure that both sides of the  stepladder are locked into place.  Ask someone to hold it for you, if possible.  Clearly mark and make sure that you can see:  Any grab bars or handrails.  First and last steps.  Where the edge of each step is.  Use tools that help you move around (mobility aids) if they are needed. These include:  Canes.  Walkers.  Scooters.  Crutches.  Turn on the lights when you go into a dark area. Replace any light bulbs as soon as they burn out.  Set up your furniture so you have a clear path. Avoid moving your furniture around.  If any of your floors are uneven, fix them.  If there are any pets around you, be aware of where they are.  Review your medicines with your doctor. Some medicines can make you feel dizzy. This can increase your chance of falling. Ask your doctor what other things that you can do to help prevent falls. This information is not intended to replace advice given to you by your health care provider. Make sure you discuss any questions you have with your health care provider. Document Released: 01/21/2009 Document Revised: 09/02/2015 Document Reviewed: 05/01/2014 Elsevier Interactive Patient Education  2017 [employer].

## 2020-02-16 NOTE — Progress Notes (Addendum)
Subjective:   Marcus [name] is a 74 y.o. male who presents for an Initial Medicare Annual Wellness Visit.   I connected with Marcus [name] today by telephone and verified that I am speaking with the correct person using two identifiers. Location patient: home Location provider: work Persons participating in the virtual visit: patient, Marine scientist.    I discussed the limitations, risks, security and privacy concerns of performing an evaluation and management service by telephone and the availability of in person appointments. I also discussed with the patient that there may be a patient responsible charge related to this service. The patient expressed understanding and verbally consented to this telephonic visit.    Interactive audio and video telecommunications were attempted between this provider and patient, however failed, due to patient having technical difficulties OR patient did not have access to video capability.  We continued and completed visit with audio only.  Some vital signs may be absent or patient reported.   Time Spent with patient on telephone encounter: 20 minutes  Review of Systems     Cardiac Risk Factors include: advanced age (>39men, >54 women);hypertension;dyslipidemia;male gender     Objective:    Today's Vitals   02/16/20 1515  Weight: 171 lb (77.6 kg)  Height: 5\' 8"  (1.727 m)   Body mass index is 26 kg/m.  Advanced Directives 02/16/2020 02/06/2020 01/28/2020 12/31/2019 12/08/2019 12/04/2019 12/15/2016  Does Patient Have a Medical Advance Directive? Yes Yes Yes Yes Yes Yes No  Type of Paramedic of [place];Living will Healthcare Power of [place] of [place];Living will [place];Living will [place];Living will -  Does patient want to make changes to medical advance directive? - - No - Patient declined No - Patient declined No - Patient declined - -  Copy of  [place] in Chart? No - copy requested No - copy requested No - copy requested - No - copy requested - -  Would patient like information on creating a medical advance directive? - - - - - - No - Patient declined    Current Medications (verified) Outpatient Encounter Medications as of 02/16/2020  Medication Sig  . acetaminophen (TYLENOL) 325 MG tablet Take 2 tablets (650 mg total) by mouth every 6 (six) hours as needed for mild pain.  Marland Kitchen aspirin EC 81 MG tablet Take 81 mg by mouth daily.  Marland Kitchen atorvastatin (LIPITOR) 80 MG tablet TAKE 1 TABLET BY MOUTH EVERY DAY (Patient taking differently: Take 80 mg by mouth every evening. )  . carboxymethylcellulose (REFRESH PLUS) 0.5 % SOLN Place 1 drop into both eyes daily as needed (dry eyes).   Marland Kitchen dexamethasone (DECADRON) 4 MG tablet Please take 1 tablet twice daily the day before, the day of, and the day after chemotherapy  . fexofenadine (ALLEGRA) 180 MG tablet Take 180 mg by mouth every evening.  . folic acid (FOLVITE) 1 MG tablet Take 1 tablet (1 mg total) by mouth daily.  . Multiple Vitamins-Minerals (EYE SUPPORT PO) Take 1 tablet by mouth daily.  . NONFORMULARY OR COMPOUNDED ITEM Apply 2 sprays topically as directed. Testosterone Topical  Preparation 100 mg (Compounded by [place])  Apply 2 clicks (50 mg) daily  . prochlorperazine (COMPAZINE) 10 MG tablet Take 1 tablet (10 mg total) by mouth every 6 (six) hours as needed.  . ramipril (ALTACE) 10 MG capsule TAKE 1 CAPSULE BY MOUTH EVERY DAY (Patient taking differently: Take 10  mg by mouth daily. )  . warfarin (COUMADIN) 10 MG tablet TAKE 1 TO 1 AND 1/2 TABLETS DAILY AS DIRECTED BY COUMADIN CLINIC (Patient taking differently: Take 10-15 mg by mouth See admin instructions. Take 1 tablet (10 mg) by mouth in the evening on Sundays, Tuesdays, Wednesdays, Thursdays, Fridays & Saturday. Take 1.5 tablets (15 mg) by mouth in the evening on Mondays only.)  . ALPRAZolam  (XANAX) 0.25 MG tablet Take 1 tablet (0.25 mg total) by mouth every 8 (eight) hours as needed for up to 20 doses for anxiety. (Patient not taking: Reported on 02/16/2020)   No facility-administered encounter medications on file as of 02/16/2020.    Allergies (verified) Amlodipine, Tetanus toxoids, Crestor [rosuvastatin], and Penicillins   History: Past Medical History:  Diagnosis Date  . Atrial fibrillation ([place])   . Dyspnea    with exertion   . Dysrhythmia   . Hypertension   . Obstructive sleep apnea 10/25/2007   cpap  . S/P AVR    2D ECHO, 04/25/2011 - EF >55%, Right ventricle-mild-moderately dilated  . Swelling of limb    LEA VENOUS, 08/22/2009 - no evidence of deep vein or superficial thrombosis; partially rupturing Baker's Cyst   Past Surgical History:  Procedure Laterality Date  . CARDIAC CATHETERIZATION Bilateral 05/10/2007   Significant 1-vessel disease, severely dilated aortic root with moderate severe aortic insufficiency  . CARDIAC SURGERY    . CARDIOVERSION  08/02/2007   150 Joule biphasic shock with restoration of sinus rhythm. Heart rate 60.  . cataract surgery     . COLONOSCOPY WITH PROPOFOL N/A 12/15/2016   Procedure: COLONOSCOPY WITH PROPOFOL;  Surgeon: Carol Ada, MD;  Location: [facility];  Service: Endoscopy;  Laterality: N/A;  . EYE SURGERY    . INTERCOSTAL NERVE BLOCK Right 12/08/2019   Procedure: INTERCOSTAL NERVE BLOCK;  Surgeon: [name], MD;  Location: [facility];  Service: Thoracic;  Laterality: Right;  . NM MYOVIEW LTD  04/08/2007   No evidence of inducible myocardial ischemia  . NODE DISSECTION Right 12/08/2019   Procedure: NODE DISSECTION;  Surgeon: [name], MD;  Location: [facility];  Service: Thoracic;  Laterality: Right;  . THYROIDECTOMY, PARTIAL    . torn meniscus in right knee surgery      Family History  Problem Relation Age of Onset  . Cancer Father    Social History   Socioeconomic History  . Marital status: Married     Spouse name: diane  . Number of children: Not on file  . Years of education: Not on file  . Highest education level: Not on file  Occupational History  . Occupation: retired  Tobacco Use  . Smoking status: Former Smoker    Types: Cigarettes    Quit date: 04/11/1987    Years since quitting: 32.8  . Smokeless tobacco: Never Used  Vaping Use  . Vaping Use: Never used  Substance and Sexual Activity  . Alcohol use: No  . Drug use: No  . Sexual activity: Not on file  Other Topics Concern  . Not on file  Social History Narrative  . Not on file   Social Determinants of Health   Financial Resource Strain: Low Risk   . Difficulty of Paying Living Expenses: Not hard at all  Food Insecurity: No Food Insecurity  . Worried About Charity fundraiser in the Last Year: Never true  . Ran Out of Food in the Last Year: Never true  Transportation Needs: No Transportation Needs  .  Lack of Transportation (Medical): No  . Lack of Transportation (Non-Medical): No  Physical Activity: Insufficiently Active  . Days of Exercise per Week: 3 days  . Minutes of Exercise per Session: 40 min  Stress: No Stress Concern Present  . Feeling of Stress : Not at all  Social Connections: Moderately Integrated  . Frequency of Communication with Friends and Family: More than three times a week  . Frequency of Social Gatherings with Friends and Family: Never  . Attends Religious Services: 1 to 4 times per year  . Active Member of Clubs or Organizations: No  . Attends Archivist Meetings: Never  . Marital Status: Married    Tobacco Counseling Counseling given: Not Answered   Clinical Intake:  Pre-visit preparation completed: Yes  Pain : No/denies pain     Nutritional Status: BMI 25 -29 Overweight Nutritional Risks: None Diabetes: No  How often do you need to have someone help you when you read instructions, pamphlets, or other written materials from your doctor or pharmacy?: 1 - Never What  is the last grade level you completed in school?: 12th grade  Diabetic?No  Interpreter Needed?: No  Information entered by :: Caroleen Hamman LPN   Activities of Daily Living In your present state of health, do you have any difficulty performing the following activities: 02/16/2020 12/11/2019  Hearing? Y Y  Comment hearing aids -  Vision? N N  Difficulty concentrating or making decisions? N Y  Walking or climbing stairs? N N  Dressing or bathing? N Y  Doing errands, shopping? N N  Preparing Food and eating ? N -  Using the Toilet? N -  In the past six months, have you accidently leaked urine? N -  Do you have problems with loss of bowel control? N -  Managing your Medications? N -  Managing your Finances? N -  Housekeeping or managing your Housekeeping? N -  Some recent data might be hidden    Patient Care Team: [name], MD as PCP - General (Family Medicine) [name], [name], MD as PCP - Cardiology (Cardiology) [name], MD as Consulting Physician (Cardiothoracic Surgery) Curt Bears, MD as Consulting Physician (Oncology) Collene Gobble, MD as Consulting Physician (Pulmonary Disease) Carol Ada, MD as Consulting Physician (Gastroenterology) Gwyndolyn Kaufman, RN as Registered Nurse  Indicate any recent Medical Services you may have received from other than [facility] providers in the past year (date may be approximate).     Assessment:   This is a routine wellness examination for [place].  Hearing/Vision screen  Hearing Screening   125Hz  250Hz  500Hz  1000Hz  2000Hz  3000Hz  4000Hz  6000Hz  8000Hz   Right ear:           Left ear:           Comments: Bilateral hearing aids  Vision Screening Comments: Wears glasses Last eye exam-01/2020-Dr. Jerline Pain  Dietary issues and exercise activities discussed: Current Exercise Habits: Home exercise routine, Type of exercise: walking, Time (Minutes): 45, Frequency (Times/Week): 3, Weekly Exercise (Minutes/Week):  135, Intensity: Mild, Exercise limited by: None identified  Goals    . Patient Stated     Drink more water, Increase activity & beat cancer       Depression Screen PHQ 2/9 Scores 02/16/2020 01/02/2020 12/24/2019  PHQ - 2 Score 0 0 0    Fall Risk Fall Risk  02/16/2020 01/02/2020  Falls in the past year? 0 0  Number falls in past yr: 0 -  Injury with Fall?  0 -  Follow up Falls prevention discussed -    Any stairs in or around the home? No  Home free of loose throw rugs in walkways, pet beds, electrical cords, etc? Yes  Adequate lighting in your home to reduce risk of falls? Yes   ASSISTIVE DEVICES UTILIZED TO PREVENT FALLS:  Life alert? No  Use of a cane, walker or w/c? No  Grab bars in the bathroom? Yes  Shower chair or bench in shower? No  Elevated toilet seat or a handicapped toilet? No   TIMED UP AND GO:  Was the test performed? No . Phone visit   Cognitive Function:No cognitive impairment noted        Immunizations Immunization History  Administered Date(s) Administered  . Fluad Quad(high Dose 65+) 12/18/2018  . Influenza Nasal 04/19/2010  . Influenza Split 12/22/2015, 12/27/2016, 01/09/2018  . Influenza, High Dose Seasonal PF 12/27/2016, 01/09/2018, 01/30/2020  . Influenza,inj,Quad PF,6+ Mos 02/02/2014  . Influenza,inj,quad, With Preservative 04/18/2013  . Influenza-Unspecified 02/09/2015, 01/09/2018, 12/11/2018  . Moderna SARS-COVID-2 Vaccination 05/01/2019, 05/30/2019, 01/31/2020  . Pneumococcal Conjugate-13 07/21/2013  . Pneumococcal Polysaccharide-23 12/21/2014  . Zoster Recombinat (Shingrix) 12/27/2016, 03/21/2017    TDAP status: Patient is allergic  Flu Vaccine status: Up to date   Pneumococcal vaccine status: Up to date   Covid-19 vaccine status: Completed vaccines  Qualifies for Shingles Vaccine? No   Zostavax completed Yes   Shingrix Completed?: Yes  Screening Tests Health Maintenance  Topic Date Due  . Hepatitis C Screening  Never  done  . TETANUS/TDAP  02/15/2021 (Originally 12/14/1964)  . COLONOSCOPY  12/16/2026  . INFLUENZA VACCINE  Completed  . COVID-19 Vaccine  Completed  . PNA vac Low Risk Adult  Completed    Health Maintenance  Health Maintenance Due  Topic Date Due  . Hepatitis C Screening  Never done    Colorectal cancer screening: No longer required.   Lung Cancer Screening: (Low Dose CT Chest recommended if Age 6-80 years, 30 pack-year currently smoking OR have quit w/in 15years.) does not qualify.     Additional Screening:  Hepatitis C Screening: does qualify; Discuss with PCP  Vision Screening: Recommended annual ophthalmology exams for early detection of glaucoma and other disorders of the eye. Is the patient up to date with their annual eye exam?  Yes  Who is the provider or what is the name of the office in which the patient attends annual eye exams? Dr. Jerline Pain   Dental Screening: Recommended annual dental exams for proper oral hygiene  Community Resource Referral / Chronic Care Management: CRR required this visit?  No   CCM required this visit?  No      Plan:     I have personally reviewed and noted the following in the patient's chart:   . Medical and social history . Use of alcohol, tobacco or illicit drugs  . Current medications and supplements . Functional ability and status . Nutritional status . Physical activity . Advanced directives . List of other physicians . Hospitalizations, surgeries, and ER visits in previous 12 months . Vitals . Screenings to include cognitive, depression, and falls . Referrals and appointments  In addition, I have reviewed and discussed with patient certain preventive protocols, quality metrics, and best practice recommendations. A written personalized care plan for preventive services as well as general preventive health recommendations were provided to patient.   Due to this being a telephonic visit, the after visit summary with  patients personalized plan was offered  to patient via mail or my-chart. Patient would like to access on my-chart.  Marta Antu, LPN   [id number]  Nurse Health Advisor  Nurse Notes: None

## 2020-02-19 ENCOUNTER — Other Ambulatory Visit: Payer: Self-pay | Admitting: Family Medicine

## 2020-02-19 ENCOUNTER — Ambulatory Visit (INDEPENDENT_AMBULATORY_CARE_PROVIDER_SITE_OTHER): Payer: Medicare HMO | Admitting: Family Medicine

## 2020-02-19 ENCOUNTER — Other Ambulatory Visit: Payer: Self-pay

## 2020-02-19 ENCOUNTER — Inpatient Hospital Stay: Payer: Medicare HMO

## 2020-02-19 ENCOUNTER — Encounter: Payer: Self-pay | Admitting: Family Medicine

## 2020-02-19 ENCOUNTER — Other Ambulatory Visit: Payer: Medicare HMO

## 2020-02-19 VITALS — BP 120/60 | Temp 97.0°F | Resp 17 | Ht 68.0 in | Wt 170.6 lb

## 2020-02-19 DIAGNOSIS — C3411 Malignant neoplasm of upper lobe, right bronchus or lung: Secondary | ICD-10-CM | POA: Diagnosis not present

## 2020-02-19 DIAGNOSIS — C3491 Malignant neoplasm of unspecified part of right bronchus or lung: Secondary | ICD-10-CM

## 2020-02-19 DIAGNOSIS — E7800 Pure hypercholesterolemia, unspecified: Secondary | ICD-10-CM | POA: Diagnosis not present

## 2020-02-19 DIAGNOSIS — I10 Essential (primary) hypertension: Secondary | ICD-10-CM | POA: Diagnosis not present

## 2020-02-19 DIAGNOSIS — Z7689 Persons encountering health services in other specified circumstances: Secondary | ICD-10-CM | POA: Diagnosis not present

## 2020-02-19 LAB — CBC WITH DIFFERENTIAL (CANCER CENTER ONLY)
Abs Immature Granulocytes: 0 10*3/uL (ref 0.00–0.07)
Basophils Absolute: 0 10*3/uL (ref 0.0–0.1)
Basophils Relative: 0 %
Eosinophils Absolute: 0 10*3/uL (ref 0.0–0.5)
Eosinophils Relative: 1 %
HCT: 34.9 % — ABNORMAL LOW (ref 39.0–52.0)
Hemoglobin: 11.3 g/dL — ABNORMAL LOW (ref 13.0–17.0)
Immature Granulocytes: 0 %
Lymphocytes Relative: 40 %
Lymphs Abs: 1 10*3/uL (ref 0.7–4.0)
MCH: 30.7 pg (ref 26.0–34.0)
MCHC: 32.4 g/dL (ref 30.0–36.0)
MCV: 94.8 fL (ref 80.0–100.0)
Monocytes Absolute: 0.2 10*3/uL (ref 0.1–1.0)
Monocytes Relative: 9 %
Neutro Abs: 1.3 10*3/uL — ABNORMAL LOW (ref 1.7–7.7)
Neutrophils Relative %: 50 %
Platelet Count: 75 10*3/uL — ABNORMAL LOW (ref 150–400)
RBC: 3.68 MIL/uL — ABNORMAL LOW (ref 4.22–5.81)
RDW: 13.1 % (ref 11.5–15.5)
WBC Count: 2.6 10*3/uL — ABNORMAL LOW (ref 4.0–10.5)
nRBC: 0 % (ref 0.0–0.2)

## 2020-02-19 LAB — CMP (CANCER CENTER ONLY)
ALT: 24 U/L (ref 0–44)
AST: 35 U/L (ref 15–41)
Albumin: 3.8 g/dL (ref 3.5–5.0)
Alkaline Phosphatase: 71 U/L (ref 38–126)
Anion gap: 5 (ref 5–15)
BUN: 13 mg/dL (ref 8–23)
CO2: 28 mmol/L (ref 22–32)
Calcium: 8.5 mg/dL — ABNORMAL LOW (ref 8.9–10.3)
Chloride: 107 mmol/L (ref 98–111)
Creatinine: 0.89 mg/dL (ref 0.61–1.24)
GFR, Estimated: >60 mL/min (ref >60)
Glucose, Bld: 91 mg/dL (ref 70–99)
Potassium: 4 mmol/L (ref 3.5–5.1)
Sodium: 140 mmol/L (ref 135–145)
Total Bilirubin: 0.8 mg/dL (ref 0.3–1.2)
Total Protein: 6.7 g/dL (ref 6.5–8.1)

## 2020-02-19 LAB — MAGNESIUM: Magnesium: 2.1 mg/dL (ref 1.7–2.4)

## 2020-02-19 NOTE — Assessment & Plan Note (Signed)
Chronic problem, excellent control on Ramipril 10mg  daily.  Currently asymptomatic.  Had recent CMP at cancer center.  No need to repeat labs

## 2020-02-19 NOTE — Progress Notes (Signed)
   Subjective:    Patient ID: Marcus [name], male    DOB: 1945-09-16, 74 y.o.   MRN: [id number]  HPI HTN- chronic problem, on Ramipril 10mg  daily.  No CP, SOB, HAs, visual changes, edema.  Hyperlipidemia- chronic problem, on Lipitor 80mg  daily.  No abd pain, N/V  Adenocarcinoma R lung- pt had a 2nd opinion at [facility].  Oncology at [facility] was in agreement w/ local chemo plan.  There is the discussion of immunotherapy after 1 year of chemo.   Review of Systems For ROS see HPI   This visit occurred during the SARS-CoV-2 public health emergency.  Safety protocols were in place, including screening questions prior to the visit, additional usage of staff PPE, and extensive cleaning of exam room while observing appropriate contact time as indicated for disinfecting solutions.       Objective:   Physical Exam Vitals reviewed.  Constitutional:      General: He is not in acute distress.    Appearance: Normal appearance. He is well-developed.  HENT:     Head: Normocephalic and atraumatic.  Eyes:     Conjunctiva/sclera: Conjunctivae normal.     Pupils: Pupils are equal, round, and reactive to light.  Neck:     Thyroid: No thyromegaly.  Cardiovascular:     Rate and Rhythm: Normal rate and regular rhythm.     Heart sounds: No murmur heard.      Comments: Loud mechanical aortic click Pulmonary:     Effort: Pulmonary effort is normal. No respiratory distress.     Breath sounds: Normal breath sounds.  Abdominal:     General: Bowel sounds are normal. There is no distension.     Palpations: Abdomen is soft.  Musculoskeletal:     Cervical back: Normal range of motion and neck supple.  Lymphadenopathy:     Cervical: No cervical adenopathy.  Skin:    General: Skin is warm and dry.  Neurological:     Mental Status: He is alert and oriented to person, place, and time.     Cranial Nerves: No cranial nerve deficit.  Psychiatric:        Behavior: Behavior normal.           Assessment &  Plan:

## 2020-02-19 NOTE — Assessment & Plan Note (Signed)
Pt is following w/ Dr Inda Merlin at [facility].  They did get a 2nd opinion at [facility] but the plans were similar so they decided to keep treatment close to home.  Will continue to follow along.

## 2020-02-19 NOTE — Assessment & Plan Note (Signed)
Chronic problem.  Tolerating Lipitor 80mg  daily w/o difficulty.  Check labs.  Adjust meds prn

## 2020-02-19 NOTE — Patient Instructions (Addendum)
Schedule your complete physical in 6 months Ask the [facility] to release the lipid panel and TSH when you get blood done there Keep up the good work!  You look great! Call with any questions or concerns Stay Safe!  Stay Healthy! Happy Thanksgiving!!!

## 2020-02-20 ENCOUNTER — Telehealth: Payer: Self-pay

## 2020-02-20 NOTE — Telephone Encounter (Signed)
Pt wife Lm wanting to know if pt needs to do anything differently given his lab results. She also stated he has not had another B-12 shot.  Discussed with Cassie PA-C and advised the pt his labs are as expected at this time and there is nothing to change at this time. Also advised he on;y received the B-12 every 9 weeks. Wife expressed understanding of this information.

## 2020-02-23 ENCOUNTER — Encounter: Payer: Self-pay | Admitting: Family Medicine

## 2020-02-23 ENCOUNTER — Ambulatory Visit (INDEPENDENT_AMBULATORY_CARE_PROVIDER_SITE_OTHER): Payer: Medicare HMO | Admitting: Internal Medicine

## 2020-02-23 DIAGNOSIS — Z952 Presence of prosthetic heart valve: Secondary | ICD-10-CM | POA: Diagnosis not present

## 2020-02-23 DIAGNOSIS — Z79899 Other long term (current) drug therapy: Secondary | ICD-10-CM

## 2020-02-23 LAB — TSH: TSH: 0.891 u[IU]/mL (ref 0.450–4.500)

## 2020-02-23 LAB — LIPID PANEL W/O CHOL/HDL RATIO
Cholesterol, Total: 155 mg/dL (ref 100–199)
HDL: 42 mg/dL (ref >39)
LDL Chol Calc (NIH): 97 mg/dL (ref 0–99)
Triglycerides: 82 mg/dL (ref 0–149)
VLDL Cholesterol Cal: 16 mg/dL (ref 5–40)

## 2020-02-23 LAB — SPECIMEN STATUS REPORT

## 2020-02-23 LAB — POCT INR: INR: 2.6 (ref 2.0–3.0)

## 2020-02-24 ENCOUNTER — Telehealth: Payer: Self-pay

## 2020-02-24 NOTE — Telephone Encounter (Signed)
Pt's wife called and stated that she wants to talk to Marcus [name] I don't thin that she trust michael's judgement from dosing yesterday will route to [employer]

## 2020-02-24 NOTE — Telephone Encounter (Signed)
Spoke with wife.  Patient has second round of chemo Thursday.  Will do dexamethasone Wed, Thur and Friday.  After last round INR jumped to 5.5, patient worried that it will go up again.  Will adjust schedule to have him take normal warfarin dose on Wednesday, skip Thursday and cut Friday dose to 10 mg (instead of 15 mg).  Wife voiced understanding of schedule.  Will have patient repeat INR Monday - to see if readings remained normalized during treatment.

## 2020-02-26 ENCOUNTER — Inpatient Hospital Stay: Payer: Medicare HMO

## 2020-02-26 ENCOUNTER — Inpatient Hospital Stay (HOSPITAL_BASED_OUTPATIENT_CLINIC_OR_DEPARTMENT_OTHER): Payer: Medicare HMO | Admitting: Internal Medicine

## 2020-02-26 ENCOUNTER — Other Ambulatory Visit: Payer: Self-pay

## 2020-02-26 ENCOUNTER — Encounter: Payer: Self-pay | Admitting: Internal Medicine

## 2020-02-26 VITALS — BP 149/86 | HR 86 | Temp 97.9°F | Resp 18 | Ht 68.0 in | Wt 175.5 lb

## 2020-02-26 DIAGNOSIS — C3411 Malignant neoplasm of upper lobe, right bronchus or lung: Secondary | ICD-10-CM | POA: Diagnosis not present

## 2020-02-26 DIAGNOSIS — Z5111 Encounter for antineoplastic chemotherapy: Secondary | ICD-10-CM | POA: Diagnosis not present

## 2020-02-26 DIAGNOSIS — C3490 Malignant neoplasm of unspecified part of unspecified bronchus or lung: Secondary | ICD-10-CM

## 2020-02-26 DIAGNOSIS — C3491 Malignant neoplasm of unspecified part of right bronchus or lung: Secondary | ICD-10-CM

## 2020-02-26 LAB — CBC WITH DIFFERENTIAL (CANCER CENTER ONLY)
Abs Immature Granulocytes: 0 10*3/uL (ref 0.00–0.07)
Basophils Absolute: 0 10*3/uL (ref 0.0–0.1)
Basophils Relative: 0 %
Eosinophils Absolute: 0 10*3/uL (ref 0.0–0.5)
Eosinophils Relative: 0 %
HCT: 32 % — ABNORMAL LOW (ref 39.0–52.0)
Hemoglobin: 10.6 g/dL — ABNORMAL LOW (ref 13.0–17.0)
Immature Granulocytes: 0 %
Lymphocytes Relative: 31 %
Lymphs Abs: 0.7 10*3/uL (ref 0.7–4.0)
MCH: 31 pg (ref 26.0–34.0)
MCHC: 33.1 g/dL (ref 30.0–36.0)
MCV: 93.6 fL (ref 80.0–100.0)
Monocytes Absolute: 0.3 10*3/uL (ref 0.1–1.0)
Monocytes Relative: 13 %
Neutro Abs: 1.3 10*3/uL — ABNORMAL LOW (ref 1.7–7.7)
Neutrophils Relative %: 56 %
Platelet Count: 187 10*3/uL (ref 150–400)
RBC: 3.42 MIL/uL — ABNORMAL LOW (ref 4.22–5.81)
RDW: 13.2 % (ref 11.5–15.5)
WBC Count: 2.3 10*3/uL — ABNORMAL LOW (ref 4.0–10.5)
nRBC: 0 % (ref 0.0–0.2)

## 2020-02-26 LAB — CMP (CANCER CENTER ONLY)
ALT: 17 U/L (ref 0–44)
AST: 26 U/L (ref 15–41)
Albumin: 3.7 g/dL (ref 3.5–5.0)
Alkaline Phosphatase: 60 U/L (ref 38–126)
Anion gap: 8 (ref 5–15)
BUN: 17 mg/dL (ref 8–23)
CO2: 25 mmol/L (ref 22–32)
Calcium: 8.6 mg/dL — ABNORMAL LOW (ref 8.9–10.3)
Chloride: 107 mmol/L (ref 98–111)
Creatinine: 0.87 mg/dL (ref 0.61–1.24)
GFR, Estimated: >60 mL/min (ref >60)
Glucose, Bld: 105 mg/dL — ABNORMAL HIGH (ref 70–99)
Potassium: 3.8 mmol/L (ref 3.5–5.1)
Sodium: 140 mmol/L (ref 135–145)
Total Bilirubin: 0.6 mg/dL (ref 0.3–1.2)
Total Protein: 6.5 g/dL (ref 6.5–8.1)

## 2020-02-26 LAB — MAGNESIUM: Magnesium: 1.7 mg/dL (ref 1.7–2.4)

## 2020-02-26 MED ORDER — SODIUM CHLORIDE 0.9 % IV SOLN
150.0000 mg | Freq: Once | INTRAVENOUS | Status: AC
  Administered 2020-02-26: 150 mg via INTRAVENOUS
  Filled 2020-02-26: qty 150

## 2020-02-26 MED ORDER — SODIUM CHLORIDE 0.9 % IV SOLN
Freq: Once | INTRAVENOUS | Status: AC
  Filled 2020-02-26: qty 10

## 2020-02-26 MED ORDER — PALONOSETRON HCL INJECTION 0.25 MG/5ML
0.2500 mg | Freq: Once | INTRAVENOUS | Status: AC
  Administered 2020-02-26: 0.25 mg via INTRAVENOUS

## 2020-02-26 MED ORDER — SODIUM CHLORIDE 0.9 % IV SOLN
500.0000 mg/m2 | Freq: Once | INTRAVENOUS | Status: AC
  Administered 2020-02-26: 1000 mg via INTRAVENOUS
  Filled 2020-02-26: qty 40

## 2020-02-26 MED ORDER — PALONOSETRON HCL INJECTION 0.25 MG/5ML
INTRAVENOUS | Status: AC
  Filled 2020-02-26: qty 5

## 2020-02-26 MED ORDER — HEPARIN SOD (PORK) LOCK FLUSH 100 UNIT/ML IV SOLN
500.0000 [IU] | Freq: Once | INTRAVENOUS | Status: DC | PRN
  Filled 2020-02-26: qty 5

## 2020-02-26 MED ORDER — SODIUM CHLORIDE 0.9 % IV SOLN
10.0000 mg | Freq: Once | INTRAVENOUS | Status: AC
  Administered 2020-02-26: 10 mg via INTRAVENOUS
  Filled 2020-02-26: qty 10

## 2020-02-26 MED ORDER — SODIUM CHLORIDE 0.9% FLUSH
10.0000 mL | INTRAVENOUS | Status: DC | PRN
  Filled 2020-02-26: qty 10

## 2020-02-26 MED ORDER — SODIUM CHLORIDE 0.9 % IV SOLN
75.0000 mg/m2 | Freq: Once | INTRAVENOUS | Status: AC
  Administered 2020-02-26: 146 mg via INTRAVENOUS
  Filled 2020-02-26: qty 146

## 2020-02-26 MED ORDER — SODIUM CHLORIDE 0.9 % IV SOLN
Freq: Once | INTRAVENOUS | Status: AC
  Filled 2020-02-26: qty 250

## 2020-02-26 NOTE — Progress Notes (Signed)
[place] [phone]) [phone]   [phone]) [phone]  OFFICE PROGRESS NOTE  [name], MD [address] [place] [address]  DIAGNOSIS: Stage IIIb (T3, N2, M0) non-small cell lung cancer, adenocarcinoma presented with right upper lobe lung mass in addition to right hilar and mediastinal lymphadenopathy. Diagnosed in December 08, 2019.  Molecular Studies: Negative for actionable mutations  PDL1: 1-49%  PRIOR THERAPY:  1) Right upper lobectomy with lymph node sampling under the care of Dr. Kipp Brood on December 08, 2019.  CURRENT THERAPY: Adjuvant systemic chemotherapy with Cisplatin 75 mg/m2 and Alimta 500 mg/m2 IV every 3 weeks. First dose expected on 02/04/20.  Status post 1 cycle.  INTERVAL HISTORY: EHAN FREAS 74 y.o. male returns to the clinic today for follow-up visit accompanied by his wife.  The patient is feeling fine today with no concerning complaints except for mild fatigue.  He tolerated the first cycle of his treatment fairly well with no significant nausea, vomiting, diarrhea or constipation.  He denied having any fever or chills.  He has no chest pain, shortness of breath, cough or hemoptysis.  He denied having any headache or visual changes.  He is here today for evaluation before starting cycle #2.   MEDICAL HISTORY: Past Medical History:  Diagnosis Date  . Atrial fibrillation ([place])   . Dyspnea    with exertion   . Dysrhythmia   . Hypertension   . Obstructive sleep apnea 10/25/2007   cpap  . S/P AVR    2D ECHO, 04/25/2011 - EF >55%, Right ventricle-mild-moderately dilated  . Swelling of limb    LEA VENOUS, 08/22/2009 - no evidence of deep vein or superficial thrombosis; partially rupturing Baker's Cyst    ALLERGIES:  is allergic to amlodipine, sulfa antibiotics, tetanus toxoids, crestor [rosuvastatin], and penicillins.  MEDICATIONS:  Current Outpatient Medications  Medication Sig Dispense Refill  . acetaminophen  (TYLENOL) 325 MG tablet Take 2 tablets (650 mg total) by mouth every 6 (six) hours as needed for mild pain. (Patient not taking: Reported on 02/19/2020)    . ALPRAZolam (XANAX) 0.25 MG tablet Take 1 tablet (0.25 mg total) by mouth every 8 (eight) hours as needed for up to 20 doses for anxiety. (Patient not taking: Reported on 02/16/2020) 20 tablet 0  . aspirin EC 81 MG tablet Take 81 mg by mouth daily.    Marland Kitchen atorvastatin (LIPITOR) 80 MG tablet TAKE 1 TABLET BY MOUTH EVERY DAY (Patient taking differently: Take 80 mg by mouth every evening. ) 90 tablet 2  . carboxymethylcellulose (REFRESH PLUS) 0.5 % SOLN Place 1 drop into both eyes daily as needed (dry eyes).     Marland Kitchen dexamethasone (DECADRON) 4 MG tablet Please take 1 tablet twice daily the day before, the day of, and the day after chemotherapy 40 tablet 2  . fexofenadine (ALLEGRA) 180 MG tablet Take 180 mg by mouth every evening.    . folic acid (FOLVITE) 1 MG tablet Take 1 tablet (1 mg total) by mouth daily. 30 tablet 2  . Multiple Vitamins-Minerals (EYE SUPPORT PO) Take 1 tablet by mouth daily.    . NONFORMULARY OR COMPOUNDED ITEM Apply 2 sprays topically as directed. Testosterone Topical  Preparation 100 mg (Compounded by [place])  Apply 2 clicks (50 mg) daily    . prochlorperazine (COMPAZINE) 10 MG tablet Take 1 tablet (10 mg total) by mouth every 6 (six) hours as needed. 30 tablet 2  .  ramipril (ALTACE) 10 MG capsule TAKE 1 CAPSULE BY MOUTH EVERY DAY (Patient taking differently: Take 10 mg by mouth daily. ) 90 capsule 3  . warfarin (COUMADIN) 10 MG tablet TAKE 1 TO 1 AND 1/2 TABLETS DAILY AS DIRECTED BY COUMADIN CLINIC (Patient taking differently: Take 10-15 mg by mouth See admin instructions. Take 1 tablet (10 mg) by mouth in the evening on Sundays, Tuesdays, Wednesdays, Thursdays, Fridays & Saturday. Take 1.5 tablets (15 mg) by mouth in the evening on Mondays only.) 135 tablet 1   No current facility-administered  medications for this visit.    SURGICAL HISTORY:  Past Surgical History:  Procedure Laterality Date  . CARDIAC CATHETERIZATION Bilateral 05/10/2007   Significant 1-vessel disease, severely dilated aortic root with moderate severe aortic insufficiency  . CARDIAC SURGERY    . CARDIOVERSION  08/02/2007   150 Joule biphasic shock with restoration of sinus rhythm. Heart rate 60.  . cataract surgery     . COLONOSCOPY WITH PROPOFOL N/A 12/15/2016   Procedure: COLONOSCOPY WITH PROPOFOL;  Surgeon: Carol Ada, MD;  Location: [facility];  Service: Endoscopy;  Laterality: N/A;  . EYE SURGERY    . INTERCOSTAL NERVE BLOCK Right 12/08/2019   Procedure: INTERCOSTAL NERVE BLOCK;  Surgeon: [name], MD;  Location: [facility];  Service: Thoracic;  Laterality: Right;  . NM MYOVIEW LTD  04/08/2007   No evidence of inducible myocardial ischemia  . NODE DISSECTION Right 12/08/2019   Procedure: NODE DISSECTION;  Surgeon: [name], MD;  Location: [facility];  Service: Thoracic;  Laterality: Right;  . THYROIDECTOMY, PARTIAL    . torn meniscus in right knee surgery       REVIEW OF SYSTEMS:  A comprehensive review of systems was negative.   PHYSICAL EXAMINATION: General appearance: alert, cooperative and no distress Head: Normocephalic, without obvious abnormality, atraumatic Neck: no adenopathy, no JVD, supple, symmetrical, trachea midline and thyroid not enlarged, symmetric, no tenderness/mass/nodules Lymph nodes: Cervical, supraclavicular, and axillary nodes normal. Resp: clear to auscultation bilaterally Back: symmetric, no curvature. ROM normal. No CVA tenderness. Cardio: regular rate and rhythm, S1, S2 normal, no murmur, click, rub or gallop GI: soft, non-tender; bowel sounds normal; no masses,  no organomegaly Extremities: extremities normal, atraumatic, no cyanosis or edema  ECOG PERFORMANCE STATUS: 1 - Symptomatic but completely ambulatory  Blood pressure (!) 149/86, pulse 86,  temperature 97.9 F (36.6 C), temperature source Tympanic, resp. rate 18, height 5' 8"  (1.727 m), weight 175 lb 8 oz (79.6 kg), SpO2 99 %.  LABORATORY DATA: Lab Results  Component Value Date   WBC 2.3 (L) 02/26/2020   HGB 10.6 (L) 02/26/2020   HCT 32.0 (L) 02/26/2020   MCV 93.6 02/26/2020   PLT 187 02/26/2020      Chemistry      Component Value Date/Time   NA 140 02/19/2020 1145   NA 139 10/28/2019 1057   K 4.0 02/19/2020 1145   CL 107 02/19/2020 1145   CO2 28 02/19/2020 1145   BUN 13 02/19/2020 1145   BUN 10 10/28/2019 1057   CREATININE 0.89 02/19/2020 1145      Component Value Date/Time   CALCIUM 8.5 (L) 02/19/2020 1145   ALKPHOS 71 02/19/2020 1145   AST 35 02/19/2020 1145   ALT 24 02/19/2020 1145   BILITOT 0.8 02/19/2020 1145       RADIOGRAPHIC STUDIES: No results found.  ASSESSMENT AND PLAN: This is a very pleasant 74 years old white male with a stage IIIb  non-small cell lung cancer, adenocarcinoma presented with right upper lobe lung mass in addition to right hilar and mediastinal lymphadenopathy diagnosed in August 2021 with negative actionable mutation and PD-L1 expression in the range of 1-49%. The patient declined enrollment in the alliance clinical trial for treatment with systemic chemotherapy plus/minus immunotherapy. He is currently undergoing adjuvant systemic chemotherapy with cisplatin 75 mg/M2 and Alimta 500 mg/M2 every 3 weeks.  Status post 1 cycle. The patient continues to tolerate his treatment fairly well with no concerning adverse effects. His absolute neutrophil count is 1300 today. I recommended for the patient to proceed with cycle #2 of his adjuvant treatment but I will add Udenyca to his treatment plan for prophylaxis because of the neutropenia after cycle #1. The patient will come back for follow-up visit in 3 weeks for evaluation before starting cycle #3. The patient was advised to call immediately if he has any concerning symptoms in the  interval. The patient voices understanding of current disease status and treatment options and is in agreement with the current care plan.  All questions were answered. The patient knows to call the clinic with any problems, questions or concerns. We can certainly see the patient much sooner if necessary.   Disclaimer: This note was dictated with voice recognition software. Similar sounding words can inadvertently be transcribed and may not be corrected upon review.

## 2020-02-26 NOTE — Patient Instructions (Signed)
[facility] Discharge Instructions for Patients Receiving Chemotherapy  Today you received the following chemotherapy agents pemetrexed, cisplatin  To help prevent nausea and vomiting after your treatment, we encourage you to take your nausea medication as directed.   If you develop nausea and vomiting that is not controlled by your nausea medication, call the clinic.   BELOW ARE SYMPTOMS THAT SHOULD BE REPORTED IMMEDIATELY:  *FEVER GREATER THAN 100.5 F  *CHILLS WITH OR WITHOUT FEVER  NAUSEA AND VOMITING THAT IS NOT CONTROLLED WITH YOUR NAUSEA MEDICATION  *UNUSUAL SHORTNESS OF BREATH  *UNUSUAL BRUISING OR BLEEDING  TENDERNESS IN MOUTH AND THROAT WITH OR WITHOUT PRESENCE OF ULCERS  *URINARY PROBLEMS  *BOWEL PROBLEMS  UNUSUAL RASH Items with * indicate a potential emergency and should be followed up as soon as possible.  Feel free to call the clinic should you have any questions or concerns. The clinic phone number is [phone].  Please show the [facility] at check-in to the Emergency Department and triage nurse.  Pemetrexed injection What is this medicine? PEMETREXED (PEM e TREX ed) is a chemotherapy drug used to treat lung cancers like non-small cell lung cancer and mesothelioma. It may also be used to treat other cancers. This medicine may be used for other purposes; ask your health care provider or pharmacist if you have questions. COMMON BRAND NAME(S): Alimta What should I tell my health care provider before I take this medicine? They need to know if you have any of these conditions:  infection (especially a virus infection such as chickenpox, cold sores, or herpes)  kidney disease  low blood counts, like low white cell, platelet, or red cell counts  lung or breathing disease, like asthma  radiation therapy  an unusual or allergic reaction to pemetrexed, other medicines, foods, dyes, or preservative  pregnant or trying to get  pregnant  breast-feeding How should I use this medicine? This drug is given as an infusion into a vein. It is administered in a hospital or clinic by a specially trained health care professional. Talk to your pediatrician regarding the use of this medicine in children. Special care may be needed. Overdosage: If you think you have taken too much of this medicine contact a poison control center or emergency room at once. NOTE: This medicine is only for you. Do not share this medicine with others. What if I miss a dose? It is important not to miss your dose. Call your doctor or health care professional if you are unable to keep an appointment. What may interact with this medicine? This medicine may interact with the following medications:  Ibuprofen This list may not describe all possible interactions. Give your health care provider a list of all the medicines, herbs, non-prescription drugs, or dietary supplements you use. Also tell them if you smoke, drink alcohol, or use illegal drugs. Some items may interact with your medicine. What should I watch for while using this medicine? Visit your doctor for checks on your progress. This drug may make you feel generally unwell. This is not uncommon, as chemotherapy can affect healthy cells as well as cancer cells. Report any side effects. Continue your course of treatment even though you feel ill unless your doctor tells you to stop. In some cases, you may be given additional medicines to help with side effects. Follow all directions for their use. Call your doctor or health care professional for advice if you get a fever, chills or sore throat, or other  symptoms of a cold or flu. Do not treat yourself. This drug decreases your body's ability to fight infections. Try to avoid being around people who are sick. This medicine may increase your risk to bruise or bleed. Call your doctor or health care professional if you notice any unusual bleeding. Be careful  brushing and flossing your teeth or using a toothpick because you may get an infection or bleed more easily. If you have any dental work done, tell your dentist you are receiving this medicine. Avoid taking products that contain aspirin, acetaminophen, ibuprofen, naproxen, or ketoprofen unless instructed by your doctor. These medicines may hide a fever. Call your doctor or health care professional if you get diarrhea or mouth sores. Do not treat yourself. To protect your kidneys, drink water or other fluids as directed while you are taking this medicine. Do not become pregnant while taking this medicine or for 6 months after stopping it. Women should inform their doctor if they wish to become pregnant or think they might be pregnant. Men should not father a child while taking this medicine and for 3 months after stopping it. This may interfere with the ability to father a child. You should talk to your doctor or health care professional if you are concerned about your fertility. There is a potential for serious side effects to an unborn child. Talk to your health care professional or pharmacist for more information. Do not breast-feed an infant while taking this medicine or for 1 week after stopping it. What side effects may I notice from receiving this medicine? Side effects that you should report to your doctor or health care professional as soon as possible:  allergic reactions like skin rash, itching or hives, swelling of the face, lips, or tongue  breathing problems  redness, blistering, peeling or loosening of the skin, including inside the mouth  signs and symptoms of bleeding such as bloody or black, tarry stools; red or dark-brown urine; spitting up blood or brown material that looks like coffee grounds; red spots on the skin; unusual bruising or bleeding from the eye, gums, or nose  signs and symptoms of infection like fever or chills; cough; sore throat; pain or trouble passing  urine  signs and symptoms of kidney injury like trouble passing urine or change in the amount of urine  signs and symptoms of liver injury like dark yellow or brown urine; general ill feeling or flu-like symptoms; light-colored stools; loss of appetite; nausea; right upper belly pain; unusually weak or tired; yellowing of the eyes or skin Side effects that usually do not require medical attention (report to your doctor or health care professional if they continue or are bothersome):  constipation  mouth sores  nausea, vomiting  unusually weak or tired This list may not describe all possible side effects. Call your doctor for medical advice about side effects. You may report side effects to FDA at 1-800-FDA-1088. Where should I keep my medicine? This drug is given in a hospital or clinic and will not be stored at home. NOTE: This sheet is a summary. It may not cover all possible information. If you have questions about this medicine, talk to your doctor, pharmacist, or health care provider.  2020 Elsevier/Gold Standard (2017-05-16 16:11:33)  Cisplatin injection What is this medicine? CISPLATIN (SIS pla tin) is a chemotherapy drug. It targets fast dividing cells, like cancer cells, and causes these cells to die. This medicine is used to treat many types of cancer like bladder, ovarian,  and testicular cancers. This medicine may be used for other purposes; ask your health care provider or pharmacist if you have questions. COMMON BRAND NAME(S): Platinol, Platinol -AQ What should I tell my health care provider before I take this medicine? They need to know if you have any of these conditions:  eye disease, vision problems  hearing problems  kidney disease  low blood counts, like white cells, platelets, or red blood cells  tingling of the fingers or toes, or other nerve disorder  an unusual or allergic reaction to cisplatin, carboplatin, oxaliplatin, other medicines, foods, dyes, or  preservatives  pregnant or trying to get pregnant  breast-feeding How should I use this medicine? This drug is given as an infusion into a vein. It is administered in a hospital or clinic by a specially trained health care professional. Talk to your pediatrician regarding the use of this medicine in children. Special care may be needed. Overdosage: If you think you have taken too much of this medicine contact a poison control center or emergency room at once. NOTE: This medicine is only for you. Do not share this medicine with others. What if I miss a dose? It is important not to miss a dose. Call your doctor or health care professional if you are unable to keep an appointment. What may interact with this medicine? This medicine may interact with the following medications:  foscarnet  certain antibiotics like amikacin, gentamicin, neomycin, polymyxin B, streptomycin, tobramycin, vancomycin This list may not describe all possible interactions. Give your health care provider a list of all the medicines, herbs, non-prescription drugs, or dietary supplements you use. Also tell them if you smoke, drink alcohol, or use illegal drugs. Some items may interact with your medicine. What should I watch for while using this medicine? Your condition will be monitored carefully while you are receiving this medicine. You will need important blood work done while you are taking this medicine. This drug may make you feel generally unwell. This is not uncommon, as chemotherapy can affect healthy cells as well as cancer cells. Report any side effects. Continue your course of treatment even though you feel ill unless your doctor tells you to stop. This medicine may increase your risk of getting an infection. Call your healthcare professional for advice if you get a fever, chills, or sore throat, or other symptoms of a cold or flu. Do not treat yourself. Try to avoid being around people who are sick. Avoid taking  medicines that contain aspirin, acetaminophen, ibuprofen, naproxen, or ketoprofen unless instructed by your healthcare professional. These medicines may hide a fever. This medicine may increase your risk to bruise or bleed. Call your doctor or health care professional if you notice any unusual bleeding. Be careful brushing and flossing your teeth or using a toothpick because you may get an infection or bleed more easily. If you have any dental work done, tell your dentist you are receiving this medicine. Do not become pregnant while taking this medicine or for 14 months after stopping it. Women should inform their healthcare professional if they wish to become pregnant or think they might be pregnant. Men should not father a child while taking this medicine and for 11 months after stopping it. There is potential for serious side effects to an unborn child. Talk to your healthcare professional for more information. Do not breast-feed an infant while taking this medicine. This medicine has caused ovarian failure in some women. This medicine may make it more difficult  to get pregnant. Talk to your healthcare professional if you are concerned about your fertility. This medicine has caused decreased sperm counts in some men. This may make it more difficult to father a child. Talk to your healthcare professional if you are concerned about your fertility. Drink fluids as directed while you are taking this medicine. This will help protect your kidneys. Call your doctor or health care professional if you get diarrhea. Do not treat yourself. What side effects may I notice from receiving this medicine? Side effects that you should report to your doctor or health care professional as soon as possible:  allergic reactions like skin rash, itching or hives, swelling of the face, lips, or tongue  blurred vision  changes in vision  decreased hearing or ringing of the ears  nausea, vomiting  pain, redness, or  irritation at site where injected  pain, tingling, numbness in the hands or feet  signs and symptoms of bleeding such as bloody or black, tarry stools; red or dark brown urine; spitting up blood or brown material that looks like coffee grounds; red spots on the skin; unusual bruising or bleeding from the eyes, gums, or nose  signs and symptoms of infection like fever; chills; cough; sore throat; pain or trouble passing urine  signs and symptoms of kidney injury like trouble passing urine or change in the amount of urine  signs and symptoms of low red blood cells or anemia such as unusually weak or tired; feeling faint or lightheaded; falls; breathing problems Side effects that usually do not require medical attention (report to your doctor or health care professional if they continue or are bothersome):  loss of appetite  mouth sores  muscle cramps This list may not describe all possible side effects. Call your doctor for medical advice about side effects. You may report side effects to FDA at 1-800-FDA-1088. Where should I keep my medicine? This drug is given in a hospital or clinic and will not be stored at home. NOTE: This sheet is a summary. It may not cover all possible information. If you have questions about this medicine, talk to your doctor, pharmacist, or health care provider.  2020 Elsevier/Gold Standard (2018-03-22 15:59:17)

## 2020-02-26 NOTE — Progress Notes (Signed)
Per Dr. Julien Nordmann, okay to proceed with treatment today with ANC 1.3. Udencya injection being added for Saturday.

## 2020-02-27 ENCOUNTER — Other Ambulatory Visit: Payer: Self-pay | Admitting: Cardiovascular Disease

## 2020-02-28 ENCOUNTER — Other Ambulatory Visit: Payer: Self-pay

## 2020-02-28 ENCOUNTER — Inpatient Hospital Stay: Payer: Medicare HMO

## 2020-02-28 VITALS — BP 149/71 | HR 45 | Temp 97.8°F | Resp 18

## 2020-02-28 DIAGNOSIS — C3490 Malignant neoplasm of unspecified part of unspecified bronchus or lung: Secondary | ICD-10-CM

## 2020-02-28 DIAGNOSIS — C3411 Malignant neoplasm of upper lobe, right bronchus or lung: Secondary | ICD-10-CM | POA: Diagnosis not present

## 2020-02-28 MED ORDER — PEGFILGRASTIM-CBQV 6 MG/0.6ML ~~LOC~~ SOSY
6.0000 mg | PREFILLED_SYRINGE | Freq: Once | SUBCUTANEOUS | Status: AC
  Administered 2020-02-28: 6 mg via SUBCUTANEOUS

## 2020-02-28 NOTE — Patient Instructions (Signed)

## 2020-03-01 ENCOUNTER — Ambulatory Visit (INDEPENDENT_AMBULATORY_CARE_PROVIDER_SITE_OTHER): Payer: Medicare HMO | Admitting: Internal Medicine

## 2020-03-01 DIAGNOSIS — Z79899 Other long term (current) drug therapy: Secondary | ICD-10-CM

## 2020-03-01 DIAGNOSIS — Z952 Presence of prosthetic heart valve: Secondary | ICD-10-CM | POA: Diagnosis not present

## 2020-03-01 LAB — POCT INR: INR: 2.7 (ref 2.0–3.0)

## 2020-03-02 ENCOUNTER — Other Ambulatory Visit: Payer: Self-pay

## 2020-03-02 ENCOUNTER — Inpatient Hospital Stay: Payer: Medicare HMO

## 2020-03-02 DIAGNOSIS — C3411 Malignant neoplasm of upper lobe, right bronchus or lung: Secondary | ICD-10-CM | POA: Diagnosis not present

## 2020-03-02 DIAGNOSIS — G4733 Obstructive sleep apnea (adult) (pediatric): Secondary | ICD-10-CM | POA: Diagnosis not present

## 2020-03-02 DIAGNOSIS — C3491 Malignant neoplasm of unspecified part of right bronchus or lung: Secondary | ICD-10-CM

## 2020-03-02 LAB — CBC WITH DIFFERENTIAL (CANCER CENTER ONLY)
Abs Immature Granulocytes: 0.9 10*3/uL — ABNORMAL HIGH (ref 0.00–0.07)
Basophils Absolute: 0 10*3/uL (ref 0.0–0.1)
Basophils Relative: 0 %
Eosinophils Absolute: 0 10*3/uL (ref 0.0–0.5)
Eosinophils Relative: 1 %
HCT: 36.7 % — ABNORMAL LOW (ref 39.0–52.0)
Hemoglobin: 11.9 g/dL — ABNORMAL LOW (ref 13.0–17.0)
Lymphocytes Relative: 36 %
Lymphs Abs: 1.6 10*3/uL (ref 0.7–4.0)
MCH: 30.9 pg (ref 26.0–34.0)
MCHC: 32.4 g/dL (ref 30.0–36.0)
MCV: 95.3 fL (ref 80.0–100.0)
Metamyelocytes Relative: 16 %
Monocytes Absolute: 0.2 10*3/uL (ref 0.1–1.0)
Monocytes Relative: 4 %
Myelocytes: 3 %
Neutro Abs: 1.8 10*3/uL (ref 1.7–7.7)
Neutrophils Relative %: 40 %
Platelet Count: 146 10*3/uL — ABNORMAL LOW (ref 150–400)
RBC: 3.85 MIL/uL — ABNORMAL LOW (ref 4.22–5.81)
RDW: 14 % (ref 11.5–15.5)
WBC Count: 4.5 10*3/uL (ref 4.0–10.5)
nRBC: 0 % (ref 0.0–0.2)

## 2020-03-02 LAB — CMP (CANCER CENTER ONLY)
ALT: 38 U/L (ref 0–44)
AST: 39 U/L (ref 15–41)
Albumin: 3.6 g/dL (ref 3.5–5.0)
Alkaline Phosphatase: 77 U/L (ref 38–126)
Anion gap: 7 (ref 5–15)
BUN: 22 mg/dL (ref 8–23)
CO2: 27 mmol/L (ref 22–32)
Calcium: 8.2 mg/dL — ABNORMAL LOW (ref 8.9–10.3)
Chloride: 104 mmol/L (ref 98–111)
Creatinine: 1.05 mg/dL (ref 0.61–1.24)
GFR, Estimated: >60 mL/min (ref >60)
Glucose, Bld: 88 mg/dL (ref 70–99)
Potassium: 4 mmol/L (ref 3.5–5.1)
Sodium: 138 mmol/L (ref 135–145)
Total Bilirubin: 0.8 mg/dL (ref 0.3–1.2)
Total Protein: 6.4 g/dL — ABNORMAL LOW (ref 6.5–8.1)

## 2020-03-02 LAB — MAGNESIUM: Magnesium: 1.7 mg/dL (ref 1.7–2.4)

## 2020-03-03 ENCOUNTER — Other Ambulatory Visit: Payer: Medicare HMO

## 2020-03-10 ENCOUNTER — Inpatient Hospital Stay: Payer: Medicare HMO | Attending: Internal Medicine

## 2020-03-10 ENCOUNTER — Other Ambulatory Visit: Payer: Self-pay

## 2020-03-10 DIAGNOSIS — Z79899 Other long term (current) drug therapy: Secondary | ICD-10-CM | POA: Insufficient documentation

## 2020-03-10 DIAGNOSIS — Z5189 Encounter for other specified aftercare: Secondary | ICD-10-CM | POA: Insufficient documentation

## 2020-03-10 DIAGNOSIS — Z5111 Encounter for antineoplastic chemotherapy: Secondary | ICD-10-CM | POA: Diagnosis not present

## 2020-03-10 DIAGNOSIS — C3411 Malignant neoplasm of upper lobe, right bronchus or lung: Secondary | ICD-10-CM | POA: Diagnosis present

## 2020-03-10 DIAGNOSIS — C3491 Malignant neoplasm of unspecified part of right bronchus or lung: Secondary | ICD-10-CM

## 2020-03-10 LAB — CMP (CANCER CENTER ONLY)
ALT: 22 U/L (ref 0–44)
AST: 29 U/L (ref 15–41)
Albumin: 3.5 g/dL (ref 3.5–5.0)
Alkaline Phosphatase: 92 U/L (ref 38–126)
Anion gap: 6 (ref 5–15)
BUN: 10 mg/dL (ref 8–23)
CO2: 27 mmol/L (ref 22–32)
Calcium: 8.8 mg/dL — ABNORMAL LOW (ref 8.9–10.3)
Chloride: 108 mmol/L (ref 98–111)
Creatinine: 0.88 mg/dL (ref 0.61–1.24)
GFR, Estimated: >60 mL/min (ref >60)
Glucose, Bld: 91 mg/dL (ref 70–99)
Potassium: 4.1 mmol/L (ref 3.5–5.1)
Sodium: 141 mmol/L (ref 135–145)
Total Bilirubin: 0.3 mg/dL (ref 0.3–1.2)
Total Protein: 6.2 g/dL — ABNORMAL LOW (ref 6.5–8.1)

## 2020-03-10 LAB — CBC WITH DIFFERENTIAL (CANCER CENTER ONLY)
Abs Immature Granulocytes: 0.94 10*3/uL — ABNORMAL HIGH (ref 0.00–0.07)
Basophils Absolute: 0 10*3/uL (ref 0.0–0.1)
Basophils Relative: 0 %
Eosinophils Absolute: 0 10*3/uL (ref 0.0–0.5)
Eosinophils Relative: 0 %
HCT: 33.1 % — ABNORMAL LOW (ref 39.0–52.0)
Hemoglobin: 10.7 g/dL — ABNORMAL LOW (ref 13.0–17.0)
Immature Granulocytes: 8 %
Lymphocytes Relative: 11 %
Lymphs Abs: 1.3 10*3/uL (ref 0.7–4.0)
MCH: 30.8 pg (ref 26.0–34.0)
MCHC: 32.3 g/dL (ref 30.0–36.0)
MCV: 95.4 fL (ref 80.0–100.0)
Monocytes Absolute: 0.6 10*3/uL (ref 0.1–1.0)
Monocytes Relative: 5 %
Neutro Abs: 9 10*3/uL — ABNORMAL HIGH (ref 1.7–7.7)
Neutrophils Relative %: 76 %
Platelet Count: 89 10*3/uL — ABNORMAL LOW (ref 150–400)
RBC: 3.47 MIL/uL — ABNORMAL LOW (ref 4.22–5.81)
RDW: 13.8 % (ref 11.5–15.5)
WBC Count: 11.9 10*3/uL — ABNORMAL HIGH (ref 4.0–10.5)
nRBC: 0 % (ref 0.0–0.2)

## 2020-03-10 LAB — MAGNESIUM: Magnesium: 1.7 mg/dL (ref 1.7–2.4)

## 2020-03-11 ENCOUNTER — Ambulatory Visit: Payer: Medicare HMO | Admitting: Cardiovascular Disease

## 2020-03-11 VITALS — BP 118/66 | HR 68 | Ht 68.0 in | Wt 173.0 lb

## 2020-03-11 DIAGNOSIS — I712 Thoracic aortic aneurysm, without rupture, unspecified: Secondary | ICD-10-CM

## 2020-03-11 DIAGNOSIS — I9789 Other postprocedural complications and disorders of the circulatory system, not elsewhere classified: Secondary | ICD-10-CM | POA: Diagnosis not present

## 2020-03-11 DIAGNOSIS — G4733 Obstructive sleep apnea (adult) (pediatric): Secondary | ICD-10-CM

## 2020-03-11 DIAGNOSIS — Z7901 Long term (current) use of anticoagulants: Secondary | ICD-10-CM

## 2020-03-11 DIAGNOSIS — R600 Localized edema: Secondary | ICD-10-CM | POA: Diagnosis not present

## 2020-03-11 DIAGNOSIS — C3491 Malignant neoplasm of unspecified part of right bronchus or lung: Secondary | ICD-10-CM

## 2020-03-11 DIAGNOSIS — I10 Essential (primary) hypertension: Secondary | ICD-10-CM | POA: Diagnosis not present

## 2020-03-11 DIAGNOSIS — E7800 Pure hypercholesterolemia, unspecified: Secondary | ICD-10-CM | POA: Diagnosis not present

## 2020-03-11 DIAGNOSIS — Z952 Presence of prosthetic heart valve: Secondary | ICD-10-CM | POA: Diagnosis not present

## 2020-03-11 DIAGNOSIS — I25810 Atherosclerosis of coronary artery bypass graft(s) without angina pectoris: Secondary | ICD-10-CM

## 2020-03-11 DIAGNOSIS — I4891 Unspecified atrial fibrillation: Secondary | ICD-10-CM

## 2020-03-11 NOTE — Progress Notes (Signed)
. Patient ID: Marcus [name], male   DOB: Feb 18, 1946, 74 y.o.   MRN: [id number]    Cardiology Office Note    Date:  03/11/2020   ID:  Marcus [name], DOB 1945-11-12, MRN [id number]  PCP:  [name], MD  Cardiologist:   [name], MD   Chief Complaint  Patient presents with  . Follow-up    12 months.  . Edema    Right leg and ankle.    History of Present Illness:  Marcus [name] is a 74 y.o. male who presents for follow-up of aortic valve mechanical prosthesis, previous repair of ascending aortic aneurysm, severe asymptomatic sinus bradycardia and coronary artery disease with previous bypass surgery (Severe AI - Bentall 23 mm St. Jude AVR, CABG x 2 - SVG-RCA and SVG-OM 2008, [place]).  His CT angiogram of the chest last July showed findings concerning for right upper lobe adenocarcinoma and he underwent surgical resection which confirmed the diagnosis (Dr. Kipp Brood).  Stage to have a IIIB (T3 N2 M0) tumor and is receiving chemotherapy with cis-platinum and Alimta with Dr. Julien Nordmann.  He is tolerating chemotherapy very well and has not had problems with hematuria or diarrhea.  He is trying to avoid losing muscle mass of by eating more red meat and his cholesterol is little higher than it was in the past.  She does not have angina or dyspnea at rest or with activity and generally feels remarkably well.  He denies syncope dizziness or palpitations.  He has not had any overt bleeding problems.  He has not had falls or injuries.  He monitors INR with a home test kit reported to our Coumadin clinic.  He has had a little bit of swelling in his right lower extremity after working in the yard kneeling down a lot yesterday, but does not have tenderness, erythema or other abnormalities.  The swelling is mostly gone today and is only slightly asymmetrical.  Paradoxically, saphenectomy was from the left lower extremity.  Inconsistently using CPAP.  On his CT from July 2021 the maximum diameter of  his aorta was only 3.8 cm, stable over time.  He underwent a nuclear stress test in August which showed normal perfusion pattern and ejection fraction of 63%.  His last echocardiogram was performed August 2020 showed normal left ventricular systolic function and excellent parameters on his mechanical aortic valve prosthesis (St. Jude 23 mm, mean gradient 8 mmHg). He was diagnosed with sleep apnea based on a study in 2009 he used CPAP for years but then after losing weight was told that he does not need to use it apnea anymore, following a repeat assessment in 2017 that showed only mild sleep apnea.  He still has his own equipment, which he purchased at the time.   He is no longer driving commercially. His recent echocardiogram in 2020 showed enlargement of the right atrium and right ventricle.  Echocardiograms from 2009 in 2014 described the right ventricle is normal in size.  The echocardiograms in 2016 2019 described the right ventricle as mildly dilated.  The most recent echo from August 2020 states that the right ventricle is severely enlarged and the right atrium is also mild-moderately dilated.   Past Medical History:  Diagnosis Date  . Atrial fibrillation ([place])   . Dyspnea    with exertion   . Dysrhythmia   . Hypertension   . Obstructive sleep apnea 10/25/2007   cpap  . S/P AVR    2D ECHO, 04/25/2011 -  EF >55%, Right ventricle-mild-moderately dilated  . Swelling of limb    LEA VENOUS, 08/22/2009 - no evidence of deep vein or superficial thrombosis; partially rupturing Baker's Cyst    Past Surgical History:  Procedure Laterality Date  . CARDIAC CATHETERIZATION Bilateral 05/10/2007   Significant 1-vessel disease, severely dilated aortic root with moderate severe aortic insufficiency  . CARDIAC SURGERY    . CARDIOVERSION  08/02/2007   150 Joule biphasic shock with restoration of sinus rhythm. Heart rate 60.  . cataract surgery     . COLONOSCOPY WITH PROPOFOL N/A 12/15/2016   Procedure:  COLONOSCOPY WITH PROPOFOL;  Surgeon: Carol Ada, MD;  Location: [facility];  Service: Endoscopy;  Laterality: N/A;  . EYE SURGERY    . INTERCOSTAL NERVE BLOCK Right 12/08/2019   Procedure: INTERCOSTAL NERVE BLOCK;  Surgeon: [name], MD;  Location: [place];  Service: Thoracic;  Laterality: Right;  . NM MYOVIEW LTD  04/08/2007   No evidence of inducible myocardial ischemia  . NODE DISSECTION Right 12/08/2019   Procedure: NODE DISSECTION;  Surgeon: [name], MD;  Location: [facility];  Service: Thoracic;  Laterality: Right;  . THYROIDECTOMY, PARTIAL    . torn meniscus in right knee surgery       Current Medications: Outpatient Medications Prior to Visit  Medication Sig Dispense Refill  . acetaminophen (TYLENOL) 325 MG tablet Take 2 tablets (650 mg total) by mouth every 6 (six) hours as needed for mild pain.    Marland Kitchen ALPRAZolam (XANAX) 0.25 MG tablet Take 1 tablet (0.25 mg total) by mouth every 8 (eight) hours as needed for up to 20 doses for anxiety. 20 tablet 0  . aspirin EC 81 MG tablet Take 81 mg by mouth daily.    Marland Kitchen atorvastatin (LIPITOR) 80 MG tablet TAKE 1 TABLET BY MOUTH EVERY DAY (Patient taking differently: Take 80 mg by mouth every evening. ) 90 tablet 2  . carboxymethylcellulose (REFRESH PLUS) 0.5 % SOLN Place 1 drop into both eyes daily as needed (dry eyes).     Marland Kitchen dexamethasone (DECADRON) 4 MG tablet Please take 1 tablet twice daily the day before, the day of, and the day after chemotherapy 40 tablet 2  . fexofenadine (ALLEGRA) 180 MG tablet Take 180 mg by mouth every evening.    . folic acid (FOLVITE) 1 MG tablet Take 1 tablet (1 mg total) by mouth daily. 30 tablet 2  . Multiple Vitamins-Minerals (EYE SUPPORT PO) Take 1 tablet by mouth daily.    . NONFORMULARY OR COMPOUNDED ITEM Apply 2 sprays topically as directed. Testosterone Topical  Preparation 100 mg (Compounded by [place])  Apply 2 clicks (50 mg) daily    . prochlorperazine  (COMPAZINE) 10 MG tablet Take 1 tablet (10 mg total) by mouth every 6 (six) hours as needed. 30 tablet 2  . ramipril (ALTACE) 10 MG capsule TAKE 1 CAPSULE BY MOUTH EVERY DAY (Patient taking differently: Take 10 mg by mouth daily. ) 90 capsule 3  . TESTOSTERONE IL Pt uses TopiClick 629BM/WU    . warfarin (COUMADIN) 10 MG tablet TAKE 1 TO 1 AND 1/2 TABLETS DAILY AS DIRECTED BY COUMADIN CLINIC 135 tablet 1   No facility-administered medications prior to visit.     Allergies:   Amlodipine, Sulfa antibiotics, Tetanus toxoids, Crestor [rosuvastatin], and Penicillins   Social History   Socioeconomic History  . Marital status: Married    Spouse name: diane  . Number of children: Not on file  .  Years of education: Not on file  . Highest education level: Not on file  Occupational History  . Occupation: retired  Tobacco Use  . Smoking status: Former Smoker    Types: Cigarettes    Quit date: 04/11/1987    Years since quitting: 32.9  . Smokeless tobacco: Never Used  Vaping Use  . Vaping Use: Never used  Substance and Sexual Activity  . Alcohol use: No  . Drug use: No  . Sexual activity: Not on file  Other Topics Concern  . Not on file  Social History Narrative  . Not on file   Social Determinants of Health   Financial Resource Strain: Low Risk   . Difficulty of Paying Living Expenses: Not hard at all  Food Insecurity: No Food Insecurity  . Worried About Charity fundraiser in the Last Year: Never true  . Ran Out of Food in the Last Year: Never true  Transportation Needs: No Transportation Needs  . Lack of Transportation (Medical): No  . Lack of Transportation (Non-Medical): No  Physical Activity: Insufficiently Active  . Days of Exercise per Week: 3 days  . Minutes of Exercise per Session: 40 min  Stress: No Stress Concern Present  . Feeling of Stress : Not at all  Social Connections: Moderately Integrated  . Frequency of Communication with Friends and Family: More than three  times a week  . Frequency of Social Gatherings with Friends and Family: Never  . Attends Religious Services: 1 to 4 times per year  . Active Member of Clubs or Organizations: No  . Attends Archivist Meetings: Never  . Marital Status: Married     Family History:  The patient's family history includes Cancer in his father.   ROS:   Please see the history of present illness.   All other systems are reviewed and are negative.   PHYSICAL EXAM:   VS:  BP 118/66 (BP Location: Left Arm, Patient Position: Sitting, Cuff Size: Normal)   Pulse 68   Ht _0  (1.727 m)   Wt 173 lb (78.5 kg)   BMI 26.30 kg/m      General: Alert, oriented x3, no distress, moderately overweight, but appears younger than his stated age Head: no evidence of trauma, PERRL, EOMI, no exophtalmos or lid lag, no myxedema, no xanthelasma; normal ears, nose and oropharynx Neck: normal jugular venous pulsations and no hepatojugular reflux; brisk carotid pulses without delay and no carotid bruits Chest: clear to auscultation, no signs of consolidation by percussion or palpation, normal fremitus, symmetrical and full respiratory excursions Cardiovascular: normal position and quality of the apical impulse, regular rhythm, very loud prosthetic valve clicks, no murmurs, rubs or gallops Abdomen: no tenderness or distention, no masses by palpation, no abnormal pulsatility or arterial bruits, normal bowel sounds, no hepatosplenomegaly Extremities: no clubbing, cyanosis , there is trace right pretibial edema; 2+ radial, ulnar and brachial pulses bilaterally; 2+ right femoral, posterior tibial and dorsalis pedis pulses; 2+ left femoral, posterior tibial and dorsalis pedis pulses; no subclavian or femoral bruits Neurological: grossly nonfocal Psych: Normal mood and affect    Wt Readings from Last 3 Encounters:  03/11/20 173 lb (78.5 kg)  02/26/20 175 lb 8 oz (79.6 kg)  02/19/20 170 lb 9.6 oz (77.4 kg)      Studies/Labs  Reviewed:   EKG:  EKG is not ordered today.  ECG from January 01, 2020 shows normal sinus rhythm with right bundle branch block (old).  Event monitor 12/11/2018  Dominant rhythm is normal sinus with typical normal circadian variation. At times there is fairly significant nocturnal bradycardia and there is tachycardia with activity.  There is no evidence of atrial fibrillation or of any meaningful ventricular arrhythmia.  No pauses are seen.  There are occasional premature atrial complexes, many times in a pattern of bigeminy.   Mildly abnormal event monitor with periods of moderate sinus bradycardia at night and occasional atrial bigeminy.  Echo 11/21/2018   1. The left ventricle has normal systolic function with an ejection fraction of 60-65%. The cavity size was normal. Mild basal septal hypertrophy. Left ventricular diastolic parameters were normal.  2. The right ventricle has normal systolic function. The cavity was severely enlarged. There is no increase in right ventricular wall thickness. Right ventricular systolic pressure is normal with an estimated pressure of 20.9 mmHg.  3. The interatrial septum appears to be lipomatous.  4. A 55m St. Jude valve is present in the aortic position. Normal aortic valve prosthesis. AV Mean Grad: 8.4 mmHg. There is trivial perivalvular AI.  5. Compared to prior echo, no significant change  Nuclear stress test December 02, 2019   Nuclear stress EF: 63%.  There was no ST segment deviation noted during stress.  No T wave inversion was noted during stress.  The study is normal.  This is a low risk study.   Low risk stress nuclear study with normal perfusion and normal left ventricular regional and global systolic function.   BMET    Component Value Date/Time   NA 141 03/10/2020 0910   NA 139 10/28/2019 1057   K 4.1 03/10/2020 0910   CL 108 03/10/2020 0910   CO2 27 03/10/2020 0910   GLUCOSE 91 03/10/2020 0910   BUN 10 03/10/2020  0910   BUN 10 10/28/2019 1057   CREATININE 0.88 03/10/2020 0910   CALCIUM 8.8 (L) 03/10/2020 0910   GFRNONAA >60 03/10/2020 0910   GFRAA >60 01/01/2020 1227    Lipid Panel     Component Value Date/Time   CHOL 155 02/19/2020 0000   TRIG 82 02/19/2020 0000   HDL 42 02/19/2020 0000   LDLCALC 97 02/19/2020 0000    ASSESSMENT:    1. H/O mechanical aortic valve replacement   2. Coronary artery disease involving coronary bypass graft of native heart without angina pectoris   3. Essential hypertension   4. Hypercholesterolemia   5. Postoperative atrial fibrillation (HCC)   6. OSA (obstructive sleep apnea)   7. Thoracic aortic aneurysm without rupture ([place]   8. H/O aortic valve replacement   9. Long term (current) use of anticoagulants   10. Adenocarcinoma of right lung, stage 3 ([place]   11. Edema of right lower leg      PLAN:  In order of problems listed above:  6. CAD s/p CABG: Asymptomatic. He had a normal stress echo in August 2021 7. HTN: Excellent control.  Trying to avoid diuretics with previous history of dehydration and near syncope. 8. HLP: On high-dose atorvastatin.  Daily would like to bring his LDL cholesterol down to less than 70 but will delay adding ezetimibe until he has completed chemotherapy. 9. Postop AF: No clinical recurrence and not seen on his event monitor.  Required cardioversion in 2009 without any clinically evident recurrence since that time. He is on anticoagulation anyway for his prosthetic aortic valve.   10. OSA: His insurance company denied in-home titration.  Not sure whether the edema has anything to do with this.  That seems to be resolving. 11. Ao aneurysm s/p Bentall repair: Maximum aortic diameter on most recent CT was only 38 mm.   His most recent scan did describe dilation of the celiac axis with a short axis of dissection, but this was also stable since 2015.  He will have more frequent CT scans of the chest due to the diagnosis of lung  cancer. 12. Mechanical AVR: Recheck echocardiogram next year.  Excellent gradients on echo from August 2020. 13. Warfarin: Compliant with INR monitoring and no bleeding problems.  Compliant with home INR monitoring, no recent falls or injuries or bleeding problems 14. Adenocarcinoma of lung, right upper lobe: On chemotherapy, well-tolerated so far. 15. Right leg edema: Minimal on exam today.  Paradoxically saphenectomy was from the opposite limb.  He is anticoagulated .  Will monitor for now.    Medication Adjustments/Labs and Tests Ordered: Current medicines are reviewed at length with the patient today.  Concerns regarding medicines are outlined above.  Medication changes, Labs and Tests ordered today are listed in the Patient Instructions below. Patient Instructions  Medication Instructions:  No changes *If you need a refill on your cardiac medications before your next appointment, please call your pharmacy*   Lab Work: Your provider would like for you to return in 6 months before your next appointment to have the following labs drawn: fasting lipid. You do not need an appointment for the lab. Once in our office lobby there is a podium where you can sign in and ring the doorbell to alert [place] that you are here. The lab is open from 8:00 am to 4:30 pm; closed for lunch from 12:45pm-1:45pm.  If you have labs (blood work) drawn today and your tests are completely normal, you will receive your results only by: Marland Kitchen MyChart Message (if you have MyChart) OR . A paper copy in the mail If you have any lab test that is abnormal or we need to change your treatment, we will call you to review the results.   Testing/Procedures: Your physician has requested that you have an echocardiogram in 6 months (May 2022). Echocardiography is a painless test that uses sound waves to create images of your heart. It provides your doctor with information about the size and shape of your heart and how well your heart's  chambers and valves are working. You may receive an ultrasound enhancing agent through an IV if needed to better visualize your heart during the echo.This procedure takes approximately one hour. There are no restrictions for this procedure. This will take place at the [address], [address].     Follow-Up: At [facility], you and your health needs are our priority.  As part of our continuing mission to provide you with exceptional heart care, we have created designated Provider Care Teams.  These Care Teams include your primary Cardiologist (physician) and Advanced Practice Providers (APPs -  Physician Assistants and Nurse Practitioners) who all work together to provide you with the care you need, when you need it.  We recommend signing up for the patient portal called "MyChart".  Sign up information is provided on this After Visit Summary.  MyChart is used to connect with patients for Virtual Visits (Telemedicine).  Patients are able to view lab/test results, encounter notes, upcoming appointments, etc.  Non-urgent messages can be sent to your provider as well.   To learn more about what you can do with MyChart, go to [email or web address].    Your next appointment:  6 month(s)  The format for your next appointment:   In Person  Provider:   You may see [name], MD or one of the following Advanced Practice Providers on your designated Care Team:    Almyra Deforest, PA-C  Fabian Sharp, [place] or   Roby Lofts, PA-C      Signed, [name], MD  03/11/2020 9:30 AM    [address], [place], [place]  [address] Phone: [phone]; Fax: [phone]  \

## 2020-03-11 NOTE — Patient Instructions (Signed)
Medication Instructions:  No changes *If you need a refill on your cardiac medications before your next appointment, please call your pharmacy*   Lab Work: Your provider would like for you to return in 6 months before your next appointment to have the following labs drawn: fasting lipid. You do not need an appointment for the lab. Once in our office lobby there is a podium where you can sign in and ring the doorbell to alert [place] that you are here. The lab is open from 8:00 am to 4:30 pm; closed for lunch from 12:45pm-1:45pm.  If you have labs (blood work) drawn today and your tests are completely normal, you will receive your results only by: Marland Kitchen MyChart Message (if you have MyChart) OR . A paper copy in the mail If you have any lab test that is abnormal or we need to change your treatment, we will call you to review the results.   Testing/Procedures: Your physician has requested that you have an echocardiogram in 6 months (May 2022). Echocardiography is a painless test that uses sound waves to create images of your heart. It provides your doctor with information about the size and shape of your heart and how well your heart's chambers and valves are working. You may receive an ultrasound enhancing agent through an IV if needed to better visualize your heart during the echo.This procedure takes approximately one hour. There are no restrictions for this procedure. This will take place at the [address].     Follow-Up: At [facility] you and your health needs are our priority.  As part of our continuing mission to provide you with exceptional heart care, we have created designated Provider Care Teams.  These Care Teams include your primary Cardiologist (physician) and Advanced Practice Providers (APPs -  Physician Assistants and Nurse Practitioners) who all work together to provide you with the care you need, when you need it.  We recommend signing up for the patient portal  called "MyChart".  Sign up information is provided on this After Visit Summary.  MyChart is used to connect with patients for Virtual Visits (Telemedicine).  Patients are able to view lab/test results, encounter notes, upcoming appointments, etc.  Non-urgent messages can be sent to your provider as well.   To learn more about what you can do with MyChart, go to [email or web address].    Your next appointment:   6 month(s)  The format for your next appointment:   In Person  Provider:   You may see [name], MD or one of the following Advanced Practice Providers on your designated Care Team:    Almyra Deforest, PA-C  Fabian Sharp, PA-C or   Roby Lofts, [place]

## 2020-03-15 ENCOUNTER — Telehealth: Payer: Self-pay | Admitting: Cardiovascular Disease

## 2020-03-15 ENCOUNTER — Ambulatory Visit (INDEPENDENT_AMBULATORY_CARE_PROVIDER_SITE_OTHER): Payer: Medicare HMO | Admitting: Pharmacist Clinician (PhC)/ Clinical Pharmacy Specialist

## 2020-03-15 DIAGNOSIS — Z79899 Other long term (current) drug therapy: Secondary | ICD-10-CM

## 2020-03-15 DIAGNOSIS — I9789 Other postprocedural complications and disorders of the circulatory system, not elsewhere classified: Secondary | ICD-10-CM | POA: Diagnosis not present

## 2020-03-15 DIAGNOSIS — I4891 Unspecified atrial fibrillation: Secondary | ICD-10-CM | POA: Diagnosis not present

## 2020-03-15 DIAGNOSIS — Z952 Presence of prosthetic heart valve: Secondary | ICD-10-CM | POA: Diagnosis not present

## 2020-03-15 DIAGNOSIS — Z7901 Long term (current) use of anticoagulants: Secondary | ICD-10-CM

## 2020-03-15 LAB — POCT INR: INR: 1.7 — AB (ref 2.0–3.0)

## 2020-03-15 MED ORDER — FUROSEMIDE 20 MG PO TABS: ORAL_TABLET | ORAL | 3 refills | Status: DC

## 2020-03-15 MED ORDER — ENOXAPARIN SODIUM 120 MG/0.8ML ~~LOC~~ SOLN: SUBCUTANEOUS | 0 refills | Status: DC

## 2020-03-15 NOTE — Telephone Encounter (Signed)
Spoke with pt wife, she reports he is not eating salty food. New script sent to the pharmacy

## 2020-03-15 NOTE — Telephone Encounter (Signed)
I know his diet has changed substantially since the chemo started. Is he eating salty foods (e.g. deli meats, canned soups or vegetables, hot dogs, canned pasta or meat, etc.). If that is not the case, can rx furosemide 20 mg to take once daily as needed for swelling.

## 2020-03-15 NOTE — Telephone Encounter (Signed)
Pt c/o swelling: STAT is pt has developed SOB within 24 hours  1) How much weight have you gained and in what time span? Has not gained weight  2) If swelling, where is the swelling located? ankles  3) Are you currently taking a fluid pill? no  4) Are you currently SOB? no  5) Do you have a log of your daily weights (if so, list)? no  6) Have you gained 3 pounds in a day or 5 pounds in a week? no  7) Have you traveled recently? no   Patient's wife states the patient has swelling in his ankles. She states he has not gained weight and that his arm veins are very hard. She states they are also raised and sore. Please advise.

## 2020-03-15 NOTE — Telephone Encounter (Signed)
Spoke with pt wife, the patient is getting swelling in his feet and ankles buy the end of the day. He denies SOB. His arms are bothering him, they are sore and feel hard to the touch. She was calling to let dr croitoru know. She has also talked with the pharm md regarding his INR. Will forward to dr croitoru to review and advise.

## 2020-03-16 ENCOUNTER — Telehealth: Payer: Self-pay | Admitting: Cardiovascular Disease

## 2020-03-16 ENCOUNTER — Other Ambulatory Visit: Payer: Self-pay | Admitting: Cardiovascular Disease

## 2020-03-16 NOTE — Progress Notes (Signed)
[facility] OFFICE PROGRESS NOTE  [name], MD [address] [place] [address]  DIAGNOSIS: Stage IIIb (T3, N2, M0) non-small cell lung cancer, adenocarcinoma presented with right upper lobe lung mass in addition to right hilar and mediastinal lymphadenopathy. Diagnosed inAugust 30, 2021.  Molecular Studies: Negative for actionable mutations  PDL1: 1-49%  PRIOR THERAPY:  1) Right upper lobectomy with lymph node sampling under the care of Dr. Kipp Brood on December 08, 2019.  CURRENT THERAPY: Adjuvant systemic chemotherapy with Cisplatin75 mg/m2and Alimta 500 mg/m2 IV every 3 weeks. First dose expected on 02/04/20.  Status post 2 cycles. Ellen Henri was added after cycle #2 due to chemotherapy induced neutropenia.   INTERVAL HISTORY: JEREMIAS BROYHILL 74 y.o. male returns to the clinic today for a follow-up visit accompanied by his wife.  The patient is feeling fairly well today without any concerning complaints. He has some thrombophlebitis on his right forearm. The plan for his treatment is to have a total of 4 rounds of chemo. He is scheduled for cycle #3 today. Therefore, they do not want a port-a-cath at this point. Of note, he is taking warfarin. He has a area of tenderness and erythema on the left forearm as well which is improving. The patient has been tolerating his adjuvant systemic chemotherapy well without any concerning adverse side effects except for fatigue and decreased appetite, particularly about 5 days after chemo, then he recovers. He is drinking plenty of fluid.  Today, he denies any fever, chills, or night sweats. He lost about 2 lbs since his last appointment. He denies any chest pain, shortness of breath, cough, or hemoptysis.  He denies any headache or visual changes.  He denies any nausea, vomiting, diarrhea, or constipation.  The patient is here today for evaluation before starting cycle #3.  MEDICAL HISTORY: Past Medical History:  Diagnosis  Date  . Atrial fibrillation ([place])   . Dyspnea    with exertion   . Dysrhythmia   . Hypertension   . Obstructive sleep apnea 10/25/2007   cpap  . S/P AVR    2D ECHO, 04/25/2011 - EF >55%, Right ventricle-mild-moderately dilated  . Swelling of limb    LEA VENOUS, 08/22/2009 - no evidence of deep vein or superficial thrombosis; partially rupturing Baker's Cyst    ALLERGIES:  is allergic to amlodipine, sulfa antibiotics, tetanus toxoids, crestor [rosuvastatin], and penicillins.  MEDICATIONS:  Current Outpatient Medications  Medication Sig Dispense Refill  . acetaminophen (TYLENOL) 325 MG tablet Take 2 tablets (650 mg total) by mouth every 6 (six) hours as needed for mild pain.    Marland Kitchen ALPRAZolam (XANAX) 0.25 MG tablet Take 1 tablet (0.25 mg total) by mouth every 8 (eight) hours as needed for up to 20 doses for anxiety. 20 tablet 0  . aspirin EC 81 MG tablet Take 81 mg by mouth daily.    Marland Kitchen atorvastatin (LIPITOR) 80 MG tablet TAKE 1 TABLET BY MOUTH EVERY DAY (Patient taking differently: Take 80 mg by mouth every evening. ) 90 tablet 2  . carboxymethylcellulose (REFRESH PLUS) 0.5 % SOLN Place 1 drop into both eyes daily as needed (dry eyes).     Marland Kitchen dexamethasone (DECADRON) 4 MG tablet Please take 1 tablet twice daily the day before, the day of, and the day after chemotherapy 40 tablet 2  . enoxaparin (LOVENOX) 120 MG/0.8ML injection Inject 1 syringe (120 mg) once daily as directed by coumadin clinic 8 mL 0  . fexofenadine (ALLEGRA)  180 MG tablet Take 180 mg by mouth every evening.    . folic acid (FOLVITE) 1 MG tablet Take 1 tablet (1 mg total) by mouth daily. 30 tablet 2  . furosemide (LASIX) 20 MG tablet Take 1 tablet as needed for swelling 30 tablet 3  . Multiple Vitamins-Minerals (EYE SUPPORT PO) Take 1 tablet by mouth daily.    . NONFORMULARY OR COMPOUNDED ITEM Apply 2 sprays topically as directed. Testosterone Topical  Preparation 100 mg (Compounded by [place])  Apply  2 clicks (50 mg) daily    . prochlorperazine (COMPAZINE) 10 MG tablet Take 1 tablet (10 mg total) by mouth every 6 (six) hours as needed. 30 tablet 2  . ramipril (ALTACE) 10 MG capsule TAKE 1 CAPSULE BY MOUTH EVERY DAY (Patient taking differently: Take 10 mg by mouth daily. ) 90 capsule 3  . TESTOSTERONE IL Pt uses TopiClick 488QB/VQ    . warfarin (COUMADIN) 10 MG tablet TAKE 1 TO 1 AND 1/2 TABLETS DAILY AS DIRECTED BY COUMADIN CLINIC 135 tablet 1   No current facility-administered medications for this visit.   Facility-Administered Medications Ordered in Other Visits  Medication Dose Route Frequency Provider Last Rate Last Admin  . 0.9 %  sodium chloride infusion   Intravenous Once Curt Bears, MD      . CISplatin (PLATINOL) 146 mg in sodium chloride 0.9 % 500 mL chemo infusion  75 mg/m2 (Treatment Plan Recorded) Intravenous Once Curt Bears, MD      . cyanocobalamin ((VITAMIN B-12)) injection 1,000 mcg  1,000 mcg Intramuscular Once Curt Bears, MD      . dexamethasone (DECADRON) 10 mg in sodium chloride 0.9 % 50 mL IVPB  10 mg Intravenous Once Curt Bears, MD      . fosaprepitant (EMEND) 150 mg in sodium chloride 0.9 % 145 mL IVPB  150 mg Intravenous Once Curt Bears, MD      . palonosetron (ALOXI) injection 0.25 mg  0.25 mg Intravenous Once Curt Bears, MD      . PEMEtrexed (ALIMTA) 1,000 mg in sodium chloride 0.9 % 100 mL chemo infusion  500 mg/m2 (Treatment Plan Recorded) Intravenous Once Curt Bears, MD      . sodium chloride 0.9 % 1,000 mL with potassium chloride 20 mEq, magnesium sulfate 2 g, mannitol 25 % 12.5 g infusion   Intravenous Once Curt Bears, MD        SURGICAL HISTORY:  Past Surgical History:  Procedure Laterality Date  . CARDIAC CATHETERIZATION Bilateral 05/10/2007   Significant 1-vessel disease, severely dilated aortic root with moderate severe aortic insufficiency  . CARDIAC SURGERY    . CARDIOVERSION  08/02/2007   150 Joule  biphasic shock with restoration of sinus rhythm. Heart rate 60.  . cataract surgery     . COLONOSCOPY WITH PROPOFOL N/A 12/15/2016   Procedure: COLONOSCOPY WITH PROPOFOL;  Surgeon: Carol Ada, MD;  Location: [facility];  Service: Endoscopy;  Laterality: N/A;  . EYE SURGERY    . INTERCOSTAL NERVE BLOCK Right 12/08/2019   Procedure: INTERCOSTAL NERVE BLOCK;  Surgeon: [name], MD;  Location: [facility];  Service: Thoracic;  Laterality: Right;  . NM MYOVIEW LTD  04/08/2007   No evidence of inducible myocardial ischemia  . NODE DISSECTION Right 12/08/2019   Procedure: NODE DISSECTION;  Surgeon: [name], MD;  Location: [facility];  Service: Thoracic;  Laterality: Right;  . THYROIDECTOMY, PARTIAL    . torn meniscus in right knee surgery  REVIEW OF SYSTEMS:   Review of Systems  Constitutional: Positive for mild fatigue. Negative for appetite change, chills, fever and unexpected weight change.  HENT: Negative for mouth sores, nosebleeds, sore throat and trouble swallowing.   Eyes: Negative for eye problems and icterus.  Respiratory: Negative for cough, hemoptysis, shortness of breath and wheezing.   Cardiovascular: Negative for chest pain and leg swelling.  Gastrointestinal: Negative for abdominal pain, constipation, diarrhea, nausea and vomiting.  Genitourinary: Negative for bladder incontinence, difficulty urinating, dysuria, frequency and hematuria.   Musculoskeletal: Negative for back pain, gait problem, neck pain and neck stiffness.  Skin: Negative for itching and rash.  Neurological: Negative for dizziness, extremity weakness, gait problem, headaches, light-headedness and seizures.  Hematological: Negative for adenopathy. Does not bruise/bleed easily.  Psychiatric/Behavioral: Negative for confusion, depression and sleep disturbance. The patient is not nervous/anxious.     PHYSICAL EXAMINATION:  Blood pressure (!) 143/74, pulse 60, temperature 97.8 F (36.6 C),  temperature source Tympanic, resp. rate 18, height 5' 8"  (1.727 m), weight 171 lb 1.6 oz (77.6 kg), SpO2 98 %.  ECOG PERFORMANCE STATUS: 1 - Symptomatic but completely ambulatory  Physical Exam  Constitutional: Oriented to person, place, and time and well-developed, well-nourished, and in no distress.  HENT:  Head: Normocephalic and atraumatic.  Mouth/Throat: Oropharynx is clear and moist. No oropharyngeal exudate.  Eyes: Conjunctivae are normal. Right eye exhibits no discharge. Left eye exhibits no discharge. No scleral icterus.  Neck: Normal range of motion. Neck supple.  Cardiovascular: Normal rate, regular rhythm, normal heart sounds and intact distal pulses.   Pulmonary/Chest: Effort normal and breath sounds normal. No respiratory distress. No wheezes. No rales.  Abdominal: Soft. Bowel sounds are normal. Exhibits no distension and no mass. There is no tenderness.  Musculoskeletal: Normal range of motion. Exhibits no edema.  Lymphadenopathy:    No cervical adenopathy.  Neurological: Alert and oriented to person, place, and time. Exhibits normal muscle tone. Gait normal. Coordination normal.  Skin: Skin is warm and dry. No rash noted. Not diaphoretic. No erythema. No pallor.  Psychiatric: Mood, memory and judgment normal.  Vitals reviewed.  LABORATORY DATA: Lab Results  Component Value Date   WBC 14.6 (H) 03/18/2020   HGB 10.7 (L) 03/18/2020   HCT 32.1 (L) 03/18/2020   MCV 92.8 03/18/2020   PLT 268 03/18/2020      Chemistry      Component Value Date/Time   NA 137 03/18/2020 0907   NA 139 10/28/2019 1057   K 4.2 03/18/2020 0907   CL 104 03/18/2020 0907   CO2 26 03/18/2020 0907   BUN 20 03/18/2020 0907   BUN 10 10/28/2019 1057   CREATININE 0.89 03/18/2020 0907      Component Value Date/Time   CALCIUM 8.8 (L) 03/18/2020 0907   ALKPHOS 85 03/18/2020 0907   AST 27 03/18/2020 0907   ALT 17 03/18/2020 0907   BILITOT 0.4 03/18/2020 0907       RADIOGRAPHIC  STUDIES:  No results found.   ASSESSMENT/PLAN:  This is a very pleasant 74 year old Caucasian male diagnosed with stage IIIb non-small cell lung cancer, adenocarcinoma.  The patient presented with a right upper lobe lung mass in addition to right hilar and mediastinal lymphadenopathy.  He was diagnosed in August 2021.  He does not have any actionable mutations.  And his PD-L1 expression is estimated to be in the range of 1 to 49%.  The patient declined enrollment in the alliance clinical trial for treatment with systemic  chemotherapy plus or minus immunotherapy.  Therefore, the patient is currently undergoing adjuvant systemic chemotherapy with cisplatin 75 mg per metered squared and Alimta 500 mg per metered squared IV every 3 weeks.  He is status post 2 cycles.  Ellen Henri was added from cycle #2 due to chemotherapy-induced neutropenia.  Labs were reviewed.  Recommend that he proceed with cycle #3 today scheduled.  We will see him back for follow-up visit in 3 weeks for evaluation before starting his last cycle of adjuvant systemic chemotherapy with cycle #4.  Regarding the thrombophlebitis, we discussed that the plan is to only have one more cycle of chemotherapy after today's treatment. For symptomatic relief, discussed he can use a heating pad or use tylenol. Discussed that we want to avoid NSAIDs if possible due to the use of cisplatin. He is doing really well drinking plenty of water daily.   The patient was advised to call immediately if he has any concerning symptoms in the interval. The patient voices understanding of current disease status and treatment options and is in agreement with the current care plan. All questions were answered. The patient knows to call the clinic with any problems, questions or concerns. We can certainly see the patient much sooner if necessary      No orders of the defined types were placed in this encounter.    Lindberg Zenon L Aleynah Rocchio,  PA-C 03/18/20

## 2020-03-16 NOTE — Telephone Encounter (Signed)
*  STAT* If patient is at the pharmacy, call can be transferred to refill team.   1. Which medications need to be refilled? (please list name of each medication and dose if known) ALPRAZolam (XANAX) 0.25 MG tablet  2. Which pharmacy/location (including street and city if local pharmacy) is medication to be sent to? [id number] - [place], [place]  3. Do they need a 30 day or 90 day supply? Patient's wife states is requesting a small supply to get the patient through 2 more sessions of chemo.

## 2020-03-17 NOTE — Telephone Encounter (Signed)
Patient's wife wants something called in for anxiety

## 2020-03-18 ENCOUNTER — Inpatient Hospital Stay: Payer: Medicare HMO

## 2020-03-18 ENCOUNTER — Inpatient Hospital Stay: Payer: Medicare HMO | Admitting: Physician Assistant

## 2020-03-18 ENCOUNTER — Other Ambulatory Visit: Payer: Self-pay

## 2020-03-18 VITALS — BP 143/74 | HR 60 | Temp 97.8°F | Resp 18 | Ht 68.0 in | Wt 171.1 lb

## 2020-03-18 DIAGNOSIS — Z5111 Encounter for antineoplastic chemotherapy: Secondary | ICD-10-CM | POA: Diagnosis not present

## 2020-03-18 DIAGNOSIS — C3490 Malignant neoplasm of unspecified part of unspecified bronchus or lung: Secondary | ICD-10-CM

## 2020-03-18 DIAGNOSIS — C3491 Malignant neoplasm of unspecified part of right bronchus or lung: Secondary | ICD-10-CM

## 2020-03-18 LAB — CBC WITH DIFFERENTIAL (CANCER CENTER ONLY)
Abs Immature Granulocytes: 0.24 10*3/uL — ABNORMAL HIGH (ref 0.00–0.07)
Basophils Absolute: 0.1 10*3/uL (ref 0.0–0.1)
Basophils Relative: 0 %
Eosinophils Absolute: 0 10*3/uL (ref 0.0–0.5)
Eosinophils Relative: 0 %
HCT: 32.1 % — ABNORMAL LOW (ref 39.0–52.0)
Hemoglobin: 10.7 g/dL — ABNORMAL LOW (ref 13.0–17.0)
Immature Granulocytes: 2 %
Lymphocytes Relative: 12 %
Lymphs Abs: 1.8 10*3/uL (ref 0.7–4.0)
MCH: 30.9 pg (ref 26.0–34.0)
MCHC: 33.3 g/dL (ref 30.0–36.0)
MCV: 92.8 fL (ref 80.0–100.0)
Monocytes Absolute: 0.9 10*3/uL (ref 0.1–1.0)
Monocytes Relative: 6 %
Neutro Abs: 11.6 10*3/uL — ABNORMAL HIGH (ref 1.7–7.7)
Neutrophils Relative %: 80 %
Platelet Count: 268 10*3/uL (ref 150–400)
RBC: 3.46 MIL/uL — ABNORMAL LOW (ref 4.22–5.81)
RDW: 14.5 % (ref 11.5–15.5)
WBC Count: 14.6 10*3/uL — ABNORMAL HIGH (ref 4.0–10.5)
nRBC: 0 % (ref 0.0–0.2)

## 2020-03-18 LAB — CMP (CANCER CENTER ONLY)
ALT: 17 U/L (ref 0–44)
AST: 27 U/L (ref 15–41)
Albumin: 3.8 g/dL (ref 3.5–5.0)
Alkaline Phosphatase: 85 U/L (ref 38–126)
Anion gap: 7 (ref 5–15)
BUN: 20 mg/dL (ref 8–23)
CO2: 26 mmol/L (ref 22–32)
Calcium: 8.8 mg/dL — ABNORMAL LOW (ref 8.9–10.3)
Chloride: 104 mmol/L (ref 98–111)
Creatinine: 0.89 mg/dL (ref 0.61–1.24)
GFR, Estimated: >60 mL/min (ref >60)
Glucose, Bld: 94 mg/dL (ref 70–99)
Potassium: 4.2 mmol/L (ref 3.5–5.1)
Sodium: 137 mmol/L (ref 135–145)
Total Bilirubin: 0.4 mg/dL (ref 0.3–1.2)
Total Protein: 6.9 g/dL (ref 6.5–8.1)

## 2020-03-18 LAB — MAGNESIUM: Magnesium: 1.8 mg/dL (ref 1.7–2.4)

## 2020-03-18 MED ORDER — CYANOCOBALAMIN 1000 MCG/ML IJ SOLN
INTRAMUSCULAR | Status: AC
  Filled 2020-03-18: qty 1

## 2020-03-18 MED ORDER — PALONOSETRON HCL INJECTION 0.25 MG/5ML
0.2500 mg | Freq: Once | INTRAVENOUS | Status: AC
  Administered 2020-03-18: 0.25 mg via INTRAVENOUS

## 2020-03-18 MED ORDER — CYANOCOBALAMIN 1000 MCG/ML IJ SOLN
1000.0000 ug | Freq: Once | INTRAMUSCULAR | Status: AC
  Administered 2020-03-18: 1000 ug via INTRAMUSCULAR

## 2020-03-18 MED ORDER — SODIUM CHLORIDE 0.9 % IV SOLN
Freq: Once | INTRAVENOUS | Status: AC
  Filled 2020-03-18: qty 250

## 2020-03-18 MED ORDER — SODIUM CHLORIDE 0.9 % IV SOLN
500.0000 mg/m2 | Freq: Once | INTRAVENOUS | Status: AC
  Administered 2020-03-18: 1000 mg via INTRAVENOUS
  Filled 2020-03-18: qty 40

## 2020-03-18 MED ORDER — ALPRAZOLAM 0.25 MG PO TABS: 0.2500 mg | ORAL_TABLET | Freq: Three times a day (TID) | ORAL | 0 refills | Status: DC | PRN

## 2020-03-18 MED ORDER — SODIUM CHLORIDE 0.9 % IV SOLN
150.0000 mg | Freq: Once | INTRAVENOUS | Status: AC
  Administered 2020-03-18: 150 mg via INTRAVENOUS
  Filled 2020-03-18: qty 150

## 2020-03-18 MED ORDER — PALONOSETRON HCL INJECTION 0.25 MG/5ML
INTRAVENOUS | Status: AC
  Filled 2020-03-18: qty 5

## 2020-03-18 MED ORDER — SODIUM CHLORIDE 0.9 % IV SOLN
Freq: Once | INTRAVENOUS | Status: AC
  Filled 2020-03-18: qty 10

## 2020-03-18 MED ORDER — SODIUM CHLORIDE 0.9 % IV SOLN
75.0000 mg/m2 | Freq: Once | INTRAVENOUS | Status: AC
  Administered 2020-03-18: 146 mg via INTRAVENOUS
  Filled 2020-03-18: qty 146

## 2020-03-18 MED ORDER — SODIUM CHLORIDE 0.9 % IV SOLN
10.0000 mg | Freq: Once | INTRAVENOUS | Status: AC
  Administered 2020-03-18: 10 mg via INTRAVENOUS
  Filled 2020-03-18: qty 10

## 2020-03-18 NOTE — Progress Notes (Signed)
Pt. Stable for discharge. Left via ambulation, no respiratory distress noted.

## 2020-03-18 NOTE — Patient Instructions (Signed)
[facility] Discharge Instructions for Patients Receiving Chemotherapy  Today you received the following chemotherapy agents pemetrexed, cisplatin  To help prevent nausea and vomiting after your treatment, we encourage you to take your nausea medication as directed.   If you develop nausea and vomiting that is not controlled by your nausea medication, call the clinic.   BELOW ARE SYMPTOMS THAT SHOULD BE REPORTED IMMEDIATELY:  *FEVER GREATER THAN 100.5 F  *CHILLS WITH OR WITHOUT FEVER  NAUSEA AND VOMITING THAT IS NOT CONTROLLED WITH YOUR NAUSEA MEDICATION  *UNUSUAL SHORTNESS OF BREATH  *UNUSUAL BRUISING OR BLEEDING  TENDERNESS IN MOUTH AND THROAT WITH OR WITHOUT PRESENCE OF ULCERS  *URINARY PROBLEMS  *BOWEL PROBLEMS  UNUSUAL RASH Items with * indicate a potential emergency and should be followed up as soon as possible.  Feel free to call the clinic should you have any questions or concerns. The clinic phone number is [phone].  Please show the [facility] at check-in to the Emergency Department and triage nurse.  Pemetrexed injection What is this medicine? PEMETREXED (PEM e TREX ed) is a chemotherapy drug used to treat lung cancers like non-small cell lung cancer and mesothelioma. It may also be used to treat other cancers. This medicine may be used for other purposes; ask your health care provider or pharmacist if you have questions. COMMON BRAND NAME(S): Alimta What should I tell my health care provider before I take this medicine? They need to know if you have any of these conditions:  infection (especially a virus infection such as chickenpox, cold sores, or herpes)  kidney disease  low blood counts, like low white cell, platelet, or red cell counts  lung or breathing disease, like asthma  radiation therapy  an unusual or allergic reaction to pemetrexed, other medicines, foods, dyes, or preservative  pregnant or trying to get  pregnant  breast-feeding How should I use this medicine? This drug is given as an infusion into a vein. It is administered in a hospital or clinic by a specially trained health care professional. Talk to your pediatrician regarding the use of this medicine in children. Special care may be needed. Overdosage: If you think you have taken too much of this medicine contact a poison control center or emergency room at once. NOTE: This medicine is only for you. Do not share this medicine with others. What if I miss a dose? It is important not to miss your dose. Call your doctor or health care professional if you are unable to keep an appointment. What may interact with this medicine? This medicine may interact with the following medications:  Ibuprofen This list may not describe all possible interactions. Give your health care provider a list of all the medicines, herbs, non-prescription drugs, or dietary supplements you use. Also tell them if you smoke, drink alcohol, or use illegal drugs. Some items may interact with your medicine. What should I watch for while using this medicine? Visit your doctor for checks on your progress. This drug may make you feel generally unwell. This is not uncommon, as chemotherapy can affect healthy cells as well as cancer cells. Report any side effects. Continue your course of treatment even though you feel ill unless your doctor tells you to stop. In some cases, you may be given additional medicines to help with side effects. Follow all directions for their use. Call your doctor or health care professional for advice if you get a fever, chills or sore throat, or other  symptoms of a cold or flu. Do not treat yourself. This drug decreases your body's ability to fight infections. Try to avoid being around people who are sick. This medicine may increase your risk to bruise or bleed. Call your doctor or health care professional if you notice any unusual bleeding. Be careful  brushing and flossing your teeth or using a toothpick because you may get an infection or bleed more easily. If you have any dental work done, tell your dentist you are receiving this medicine. Avoid taking products that contain aspirin, acetaminophen, ibuprofen, naproxen, or ketoprofen unless instructed by your doctor. These medicines may hide a fever. Call your doctor or health care professional if you get diarrhea or mouth sores. Do not treat yourself. To protect your kidneys, drink water or other fluids as directed while you are taking this medicine. Do not become pregnant while taking this medicine or for 6 months after stopping it. Women should inform their doctor if they wish to become pregnant or think they might be pregnant. Men should not father a child while taking this medicine and for 3 months after stopping it. This may interfere with the ability to father a child. You should talk to your doctor or health care professional if you are concerned about your fertility. There is a potential for serious side effects to an unborn child. Talk to your health care professional or pharmacist for more information. Do not breast-feed an infant while taking this medicine or for 1 week after stopping it. What side effects may I notice from receiving this medicine? Side effects that you should report to your doctor or health care professional as soon as possible:  allergic reactions like skin rash, itching or hives, swelling of the face, lips, or tongue  breathing problems  redness, blistering, peeling or loosening of the skin, including inside the mouth  signs and symptoms of bleeding such as bloody or black, tarry stools; red or dark-brown urine; spitting up blood or brown material that looks like coffee grounds; red spots on the skin; unusual bruising or bleeding from the eye, gums, or nose  signs and symptoms of infection like fever or chills; cough; sore throat; pain or trouble passing  urine  signs and symptoms of kidney injury like trouble passing urine or change in the amount of urine  signs and symptoms of liver injury like dark yellow or brown urine; general ill feeling or flu-like symptoms; light-colored stools; loss of appetite; nausea; right upper belly pain; unusually weak or tired; yellowing of the eyes or skin Side effects that usually do not require medical attention (report to your doctor or health care professional if they continue or are bothersome):  constipation  mouth sores  nausea, vomiting  unusually weak or tired This list may not describe all possible side effects. Call your doctor for medical advice about side effects. You may report side effects to FDA at 1-800-FDA-1088. Where should I keep my medicine? This drug is given in a hospital or clinic and will not be stored at home. NOTE: This sheet is a summary. It may not cover all possible information. If you have questions about this medicine, talk to your doctor, pharmacist, or health care provider.  2020 Elsevier/Gold Standard (2017-05-16 16:11:33)  Cisplatin injection What is this medicine? CISPLATIN (SIS pla tin) is a chemotherapy drug. It targets fast dividing cells, like cancer cells, and causes these cells to die. This medicine is used to treat many types of cancer like bladder, ovarian,  and testicular cancers. This medicine may be used for other purposes; ask your health care provider or pharmacist if you have questions. COMMON BRAND NAME(S): Platinol, Platinol -AQ What should I tell my health care provider before I take this medicine? They need to know if you have any of these conditions:  eye disease, vision problems  hearing problems  kidney disease  low blood counts, like white cells, platelets, or red blood cells  tingling of the fingers or toes, or other nerve disorder  an unusual or allergic reaction to cisplatin, carboplatin, oxaliplatin, other medicines, foods, dyes, or  preservatives  pregnant or trying to get pregnant  breast-feeding How should I use this medicine? This drug is given as an infusion into a vein. It is administered in a hospital or clinic by a specially trained health care professional. Talk to your pediatrician regarding the use of this medicine in children. Special care may be needed. Overdosage: If you think you have taken too much of this medicine contact a poison control center or emergency room at once. NOTE: This medicine is only for you. Do not share this medicine with others. What if I miss a dose? It is important not to miss a dose. Call your doctor or health care professional if you are unable to keep an appointment. What may interact with this medicine? This medicine may interact with the following medications:  foscarnet  certain antibiotics like amikacin, gentamicin, neomycin, polymyxin B, streptomycin, tobramycin, vancomycin This list may not describe all possible interactions. Give your health care provider a list of all the medicines, herbs, non-prescription drugs, or dietary supplements you use. Also tell them if you smoke, drink alcohol, or use illegal drugs. Some items may interact with your medicine. What should I watch for while using this medicine? Your condition will be monitored carefully while you are receiving this medicine. You will need important blood work done while you are taking this medicine. This drug may make you feel generally unwell. This is not uncommon, as chemotherapy can affect healthy cells as well as cancer cells. Report any side effects. Continue your course of treatment even though you feel ill unless your doctor tells you to stop. This medicine may increase your risk of getting an infection. Call your healthcare professional for advice if you get a fever, chills, or sore throat, or other symptoms of a cold or flu. Do not treat yourself. Try to avoid being around people who are sick. Avoid taking  medicines that contain aspirin, acetaminophen, ibuprofen, naproxen, or ketoprofen unless instructed by your healthcare professional. These medicines may hide a fever. This medicine may increase your risk to bruise or bleed. Call your doctor or health care professional if you notice any unusual bleeding. Be careful brushing and flossing your teeth or using a toothpick because you may get an infection or bleed more easily. If you have any dental work done, tell your dentist you are receiving this medicine. Do not become pregnant while taking this medicine or for 14 months after stopping it. Women should inform their healthcare professional if they wish to become pregnant or think they might be pregnant. Men should not father a child while taking this medicine and for 11 months after stopping it. There is potential for serious side effects to an unborn child. Talk to your healthcare professional for more information. Do not breast-feed an infant while taking this medicine. This medicine has caused ovarian failure in some women. This medicine may make it more difficult  to get pregnant. Talk to your healthcare professional if you are concerned about your fertility. This medicine has caused decreased sperm counts in some men. This may make it more difficult to father a child. Talk to your healthcare professional if you are concerned about your fertility. Drink fluids as directed while you are taking this medicine. This will help protect your kidneys. Call your doctor or health care professional if you get diarrhea. Do not treat yourself. What side effects may I notice from receiving this medicine? Side effects that you should report to your doctor or health care professional as soon as possible:  allergic reactions like skin rash, itching or hives, swelling of the face, lips, or tongue  blurred vision  changes in vision  decreased hearing or ringing of the ears  nausea, vomiting  pain, redness, or  irritation at site where injected  pain, tingling, numbness in the hands or feet  signs and symptoms of bleeding such as bloody or black, tarry stools; red or dark brown urine; spitting up blood or brown material that looks like coffee grounds; red spots on the skin; unusual bruising or bleeding from the eyes, gums, or nose  signs and symptoms of infection like fever; chills; cough; sore throat; pain or trouble passing urine  signs and symptoms of kidney injury like trouble passing urine or change in the amount of urine  signs and symptoms of low red blood cells or anemia such as unusually weak or tired; feeling faint or lightheaded; falls; breathing problems Side effects that usually do not require medical attention (report to your doctor or health care professional if they continue or are bothersome):  loss of appetite  mouth sores  muscle cramps This list may not describe all possible side effects. Call your doctor for medical advice about side effects. You may report side effects to FDA at 1-800-FDA-1088. Where should I keep my medicine? This drug is given in a hospital or clinic and will not be stored at home. NOTE: This sheet is a summary. It may not cover all possible information. If you have questions about this medicine, talk to your doctor, pharmacist, or health care provider.  2020 Elsevier/Gold Standard (2018-03-22 15:59:17)

## 2020-03-18 NOTE — Addendum Note (Signed)
Addended by: [name] on: 03/18/2020 08:38 AM   Modules accepted: Orders

## 2020-03-19 ENCOUNTER — Telehealth: Payer: Self-pay

## 2020-03-19 NOTE — Telephone Encounter (Signed)
Filled yesterday

## 2020-03-19 NOTE — Telephone Encounter (Signed)
Called and left pt a vm that Rx has ben filled per Dr. Birdie Riddle.

## 2020-03-20 ENCOUNTER — Inpatient Hospital Stay: Payer: Medicare HMO

## 2020-03-20 ENCOUNTER — Other Ambulatory Visit: Payer: Self-pay

## 2020-03-20 VITALS — BP 135/74 | HR 54 | Temp 97.6°F | Resp 17 | Ht 68.0 in

## 2020-03-20 DIAGNOSIS — C3490 Malignant neoplasm of unspecified part of unspecified bronchus or lung: Secondary | ICD-10-CM

## 2020-03-20 DIAGNOSIS — Z5111 Encounter for antineoplastic chemotherapy: Secondary | ICD-10-CM | POA: Diagnosis not present

## 2020-03-20 MED ORDER — PEGFILGRASTIM-CBQV 6 MG/0.6ML ~~LOC~~ SOSY
6.0000 mg | PREFILLED_SYRINGE | Freq: Once | SUBCUTANEOUS | Status: AC
  Administered 2020-03-20: 6 mg via SUBCUTANEOUS

## 2020-03-20 NOTE — Patient Instructions (Signed)

## 2020-03-22 ENCOUNTER — Ambulatory Visit: Payer: Medicare HMO | Admitting: Family Medicine

## 2020-03-24 DIAGNOSIS — Z952 Presence of prosthetic heart valve: Secondary | ICD-10-CM | POA: Diagnosis not present

## 2020-03-24 LAB — POCT INR: INR: 2.7 (ref 2.0–3.0)

## 2020-03-25 ENCOUNTER — Other Ambulatory Visit: Payer: Self-pay

## 2020-03-25 ENCOUNTER — Inpatient Hospital Stay: Payer: Medicare HMO

## 2020-03-25 DIAGNOSIS — Z5111 Encounter for antineoplastic chemotherapy: Secondary | ICD-10-CM | POA: Diagnosis not present

## 2020-03-25 DIAGNOSIS — C3491 Malignant neoplasm of unspecified part of right bronchus or lung: Secondary | ICD-10-CM

## 2020-03-25 LAB — CMP (CANCER CENTER ONLY)
ALT: 31 U/L (ref 0–44)
AST: 38 U/L (ref 15–41)
Albumin: 3.5 g/dL (ref 3.5–5.0)
Alkaline Phosphatase: 149 U/L — ABNORMAL HIGH (ref 38–126)
Anion gap: 7 (ref 5–15)
BUN: 20 mg/dL (ref 8–23)
CO2: 24 mmol/L (ref 22–32)
Calcium: 8.3 mg/dL — ABNORMAL LOW (ref 8.9–10.3)
Chloride: 107 mmol/L (ref 98–111)
Creatinine: 1.09 mg/dL (ref 0.61–1.24)
GFR, Estimated: >60 mL/min (ref >60)
Glucose, Bld: 108 mg/dL — ABNORMAL HIGH (ref 70–99)
Potassium: 4.3 mmol/L (ref 3.5–5.1)
Sodium: 138 mmol/L (ref 135–145)
Total Bilirubin: 0.4 mg/dL (ref 0.3–1.2)
Total Protein: 6.1 g/dL — ABNORMAL LOW (ref 6.5–8.1)

## 2020-03-25 LAB — CBC WITH DIFFERENTIAL (CANCER CENTER ONLY)
Abs Immature Granulocytes: 0.21 10*3/uL — ABNORMAL HIGH (ref 0.00–0.07)
Basophils Absolute: 0.1 10*3/uL (ref 0.0–0.1)
Basophils Relative: 1 %
Eosinophils Absolute: 0 10*3/uL (ref 0.0–0.5)
Eosinophils Relative: 0 %
HCT: 31.1 % — ABNORMAL LOW (ref 39.0–52.0)
Hemoglobin: 10.3 g/dL — ABNORMAL LOW (ref 13.0–17.0)
Immature Granulocytes: 1 %
Lymphocytes Relative: 6 %
Lymphs Abs: 0.9 10*3/uL (ref 0.7–4.0)
MCH: 30.9 pg (ref 26.0–34.0)
MCHC: 33.1 g/dL (ref 30.0–36.0)
MCV: 93.4 fL (ref 80.0–100.0)
Monocytes Absolute: 0.9 10*3/uL (ref 0.1–1.0)
Monocytes Relative: 6 %
Neutro Abs: 12.9 10*3/uL — ABNORMAL HIGH (ref 1.7–7.7)
Neutrophils Relative %: 86 %
Platelet Count: 129 10*3/uL — ABNORMAL LOW (ref 150–400)
RBC: 3.33 MIL/uL — ABNORMAL LOW (ref 4.22–5.81)
RDW: 15.2 % (ref 11.5–15.5)
WBC Count: 15 10*3/uL — ABNORMAL HIGH (ref 4.0–10.5)
nRBC: 0 % (ref 0.0–0.2)

## 2020-03-25 LAB — MAGNESIUM: Magnesium: 1.7 mg/dL (ref 1.7–2.4)

## 2020-03-26 ENCOUNTER — Ambulatory Visit (INDEPENDENT_AMBULATORY_CARE_PROVIDER_SITE_OTHER): Payer: Medicare HMO | Admitting: Cardiovascular Disease

## 2020-03-26 DIAGNOSIS — Z952 Presence of prosthetic heart valve: Secondary | ICD-10-CM

## 2020-03-26 DIAGNOSIS — Z79899 Other long term (current) drug therapy: Secondary | ICD-10-CM | POA: Diagnosis not present

## 2020-04-01 ENCOUNTER — Other Ambulatory Visit: Payer: Self-pay

## 2020-04-01 ENCOUNTER — Inpatient Hospital Stay: Payer: Medicare HMO

## 2020-04-01 DIAGNOSIS — C3491 Malignant neoplasm of unspecified part of right bronchus or lung: Secondary | ICD-10-CM

## 2020-04-01 DIAGNOSIS — Z5111 Encounter for antineoplastic chemotherapy: Secondary | ICD-10-CM | POA: Diagnosis not present

## 2020-04-01 LAB — CBC WITH DIFFERENTIAL (CANCER CENTER ONLY)
Abs Immature Granulocytes: 0.19 10*3/uL — ABNORMAL HIGH (ref 0.00–0.07)
Basophils Absolute: 0 10*3/uL (ref 0.0–0.1)
Basophils Relative: 0 %
Eosinophils Absolute: 0 10*3/uL (ref 0.0–0.5)
Eosinophils Relative: 0 %
HCT: 29.3 % — ABNORMAL LOW (ref 39.0–52.0)
Hemoglobin: 9.6 g/dL — ABNORMAL LOW (ref 13.0–17.0)
Immature Granulocytes: 2 %
Lymphocytes Relative: 12 %
Lymphs Abs: 1.4 10*3/uL (ref 0.7–4.0)
MCH: 30.8 pg (ref 26.0–34.0)
MCHC: 32.8 g/dL (ref 30.0–36.0)
MCV: 93.9 fL (ref 80.0–100.0)
Monocytes Absolute: 0.7 10*3/uL (ref 0.1–1.0)
Monocytes Relative: 6 %
Neutro Abs: 8.9 10*3/uL — ABNORMAL HIGH (ref 1.7–7.7)
Neutrophils Relative %: 80 %
Platelet Count: 137 10*3/uL — ABNORMAL LOW (ref 150–400)
RBC: 3.12 MIL/uL — ABNORMAL LOW (ref 4.22–5.81)
RDW: 15.9 % — ABNORMAL HIGH (ref 11.5–15.5)
WBC Count: 11.3 10*3/uL — ABNORMAL HIGH (ref 4.0–10.5)
nRBC: 0 % (ref 0.0–0.2)

## 2020-04-01 LAB — CMP (CANCER CENTER ONLY)
ALT: 21 U/L (ref 0–44)
AST: 24 U/L (ref 15–41)
Albumin: 3.5 g/dL (ref 3.5–5.0)
Alkaline Phosphatase: 119 U/L (ref 38–126)
Anion gap: 7 (ref 5–15)
BUN: 17 mg/dL (ref 8–23)
CO2: 25 mmol/L (ref 22–32)
Calcium: 8.5 mg/dL — ABNORMAL LOW (ref 8.9–10.3)
Chloride: 105 mmol/L (ref 98–111)
Creatinine: 0.96 mg/dL (ref 0.61–1.24)
GFR, Estimated: >60 mL/min (ref >60)
Glucose, Bld: 78 mg/dL (ref 70–99)
Potassium: 4.4 mmol/L (ref 3.5–5.1)
Sodium: 137 mmol/L (ref 135–145)
Total Bilirubin: 0.3 mg/dL (ref 0.3–1.2)
Total Protein: 6.4 g/dL — ABNORMAL LOW (ref 6.5–8.1)

## 2020-04-01 LAB — MAGNESIUM: Magnesium: 1.7 mg/dL (ref 1.7–2.4)

## 2020-04-06 ENCOUNTER — Ambulatory Visit (INDEPENDENT_AMBULATORY_CARE_PROVIDER_SITE_OTHER): Payer: Medicare HMO | Admitting: Cardiology

## 2020-04-06 DIAGNOSIS — Z952 Presence of prosthetic heart valve: Secondary | ICD-10-CM | POA: Diagnosis not present

## 2020-04-06 DIAGNOSIS — Z79899 Other long term (current) drug therapy: Secondary | ICD-10-CM

## 2020-04-06 LAB — POCT INR: INR: 1.6 — AB (ref 2.0–3.0)

## 2020-04-08 ENCOUNTER — Other Ambulatory Visit: Payer: Self-pay

## 2020-04-08 ENCOUNTER — Inpatient Hospital Stay: Payer: Medicare HMO

## 2020-04-08 DIAGNOSIS — C3491 Malignant neoplasm of unspecified part of right bronchus or lung: Secondary | ICD-10-CM

## 2020-04-08 DIAGNOSIS — Z5111 Encounter for antineoplastic chemotherapy: Secondary | ICD-10-CM | POA: Diagnosis not present

## 2020-04-08 LAB — CBC WITH DIFFERENTIAL (CANCER CENTER ONLY)
Abs Immature Granulocytes: 0.22 10*3/uL — ABNORMAL HIGH (ref 0.00–0.07)
Basophils Absolute: 0.1 10*3/uL (ref 0.0–0.1)
Basophils Relative: 1 %
Eosinophils Absolute: 0.1 10*3/uL (ref 0.0–0.5)
Eosinophils Relative: 1 %
HCT: 30.6 % — ABNORMAL LOW (ref 39.0–52.0)
Hemoglobin: 10 g/dL — ABNORMAL LOW (ref 13.0–17.0)
Immature Granulocytes: 2 %
Lymphocytes Relative: 13 %
Lymphs Abs: 1.6 10*3/uL (ref 0.7–4.0)
MCH: 31.4 pg (ref 26.0–34.0)
MCHC: 32.7 g/dL (ref 30.0–36.0)
MCV: 96.2 fL (ref 80.0–100.0)
Monocytes Absolute: 0.9 10*3/uL (ref 0.1–1.0)
Monocytes Relative: 7 %
Neutro Abs: 9.2 10*3/uL — ABNORMAL HIGH (ref 1.7–7.7)
Neutrophils Relative %: 76 %
Platelet Count: 194 10*3/uL (ref 150–400)
RBC: 3.18 MIL/uL — ABNORMAL LOW (ref 4.22–5.81)
RDW: 17.2 % — ABNORMAL HIGH (ref 11.5–15.5)
WBC Count: 12 10*3/uL — ABNORMAL HIGH (ref 4.0–10.5)
nRBC: 0 % (ref 0.0–0.2)

## 2020-04-08 LAB — CMP (CANCER CENTER ONLY)
ALT: 20 U/L (ref 0–44)
AST: 29 U/L (ref 15–41)
Albumin: 3.6 g/dL (ref 3.5–5.0)
Alkaline Phosphatase: 95 U/L (ref 38–126)
Anion gap: 2 — ABNORMAL LOW (ref 5–15)
BUN: 14 mg/dL (ref 8–23)
CO2: 29 mmol/L (ref 22–32)
Calcium: 8.6 mg/dL — ABNORMAL LOW (ref 8.9–10.3)
Chloride: 109 mmol/L (ref 98–111)
Creatinine: 0.91 mg/dL (ref 0.61–1.24)
GFR, Estimated: >60 mL/min (ref >60)
Glucose, Bld: 82 mg/dL (ref 70–99)
Potassium: 4.5 mmol/L (ref 3.5–5.1)
Sodium: 140 mmol/L (ref 135–145)
Total Bilirubin: 0.3 mg/dL (ref 0.3–1.2)
Total Protein: 6.7 g/dL (ref 6.5–8.1)

## 2020-04-08 LAB — MAGNESIUM: Magnesium: 1.8 mg/dL (ref 1.7–2.4)

## 2020-04-12 ENCOUNTER — Ambulatory Visit (INDEPENDENT_AMBULATORY_CARE_PROVIDER_SITE_OTHER): Payer: Medicare HMO | Admitting: Cardiology

## 2020-04-12 DIAGNOSIS — Z79899 Other long term (current) drug therapy: Secondary | ICD-10-CM | POA: Diagnosis not present

## 2020-04-12 DIAGNOSIS — Z952 Presence of prosthetic heart valve: Secondary | ICD-10-CM

## 2020-04-12 LAB — POCT INR: INR: 1.4 — AB (ref 2.0–3.0)

## 2020-04-12 NOTE — Progress Notes (Signed)
Separated IV supplements (Mg & K) & removed Mannitol (Cisplatin < 100 mg/m2) from IV hydration per stock shortages.  Kennith Center, Pharm.D., CPP 04/12/2020@2 :19 PM

## 2020-04-13 ENCOUNTER — Inpatient Hospital Stay: Payer: Medicare HMO

## 2020-04-13 ENCOUNTER — Other Ambulatory Visit: Payer: Self-pay

## 2020-04-13 ENCOUNTER — Inpatient Hospital Stay: Payer: Medicare HMO | Attending: Internal Medicine | Admitting: Internal Medicine

## 2020-04-13 VITALS — BP 143/80 | HR 55 | Temp 97.7°F | Resp 18 | Ht 68.0 in | Wt 174.4 lb

## 2020-04-13 DIAGNOSIS — C3491 Malignant neoplasm of unspecified part of right bronchus or lung: Secondary | ICD-10-CM | POA: Diagnosis not present

## 2020-04-13 DIAGNOSIS — Z79899 Other long term (current) drug therapy: Secondary | ICD-10-CM | POA: Diagnosis not present

## 2020-04-13 DIAGNOSIS — Z5111 Encounter for antineoplastic chemotherapy: Secondary | ICD-10-CM | POA: Insufficient documentation

## 2020-04-13 DIAGNOSIS — C3490 Malignant neoplasm of unspecified part of unspecified bronchus or lung: Secondary | ICD-10-CM

## 2020-04-13 DIAGNOSIS — C3411 Malignant neoplasm of upper lobe, right bronchus or lung: Secondary | ICD-10-CM | POA: Insufficient documentation

## 2020-04-13 DIAGNOSIS — Z5189 Encounter for other specified aftercare: Secondary | ICD-10-CM | POA: Insufficient documentation

## 2020-04-13 LAB — CMP (CANCER CENTER ONLY)
ALT: 16 U/L (ref 0–44)
AST: 25 U/L (ref 15–41)
Albumin: 3.7 g/dL (ref 3.5–5.0)
Alkaline Phosphatase: 81 U/L (ref 38–126)
Anion gap: 8 (ref 5–15)
BUN: 18 mg/dL (ref 8–23)
CO2: 24 mmol/L (ref 22–32)
Calcium: 8.7 mg/dL — ABNORMAL LOW (ref 8.9–10.3)
Chloride: 105 mmol/L (ref 98–111)
Creatinine: 0.93 mg/dL (ref 0.61–1.24)
GFR, Estimated: >60 mL/min (ref >60)
Glucose, Bld: 145 mg/dL — ABNORMAL HIGH (ref 70–99)
Potassium: 4.2 mmol/L (ref 3.5–5.1)
Sodium: 137 mmol/L (ref 135–145)
Total Bilirubin: 0.5 mg/dL (ref 0.3–1.2)
Total Protein: 6.6 g/dL (ref 6.5–8.1)

## 2020-04-13 LAB — CBC WITH DIFFERENTIAL (CANCER CENTER ONLY)
Abs Immature Granulocytes: 0.05 10*3/uL (ref 0.00–0.07)
Basophils Absolute: 0 10*3/uL (ref 0.0–0.1)
Basophils Relative: 0 %
Eosinophils Absolute: 0 10*3/uL (ref 0.0–0.5)
Eosinophils Relative: 0 %
HCT: 29.9 % — ABNORMAL LOW (ref 39.0–52.0)
Hemoglobin: 9.7 g/dL — ABNORMAL LOW (ref 13.0–17.0)
Immature Granulocytes: 1 %
Lymphocytes Relative: 9 %
Lymphs Abs: 0.9 10*3/uL (ref 0.7–4.0)
MCH: 31.1 pg (ref 26.0–34.0)
MCHC: 32.4 g/dL (ref 30.0–36.0)
MCV: 95.8 fL (ref 80.0–100.0)
Monocytes Absolute: 0.4 10*3/uL (ref 0.1–1.0)
Monocytes Relative: 4 %
Neutro Abs: 8.5 10*3/uL — ABNORMAL HIGH (ref 1.7–7.7)
Neutrophils Relative %: 86 %
Platelet Count: 177 10*3/uL (ref 150–400)
RBC: 3.12 MIL/uL — ABNORMAL LOW (ref 4.22–5.81)
RDW: 17.3 % — ABNORMAL HIGH (ref 11.5–15.5)
WBC Count: 9.9 10*3/uL (ref 4.0–10.5)
nRBC: 0 % (ref 0.0–0.2)

## 2020-04-13 LAB — MAGNESIUM: Magnesium: 1.7 mg/dL (ref 1.7–2.4)

## 2020-04-13 MED ORDER — SODIUM CHLORIDE 0.9 % IV SOLN
10.0000 mg | Freq: Once | INTRAVENOUS | Status: AC
  Administered 2020-04-13: 10 mg via INTRAVENOUS
  Filled 2020-04-13: qty 10

## 2020-04-13 MED ORDER — SODIUM CHLORIDE 0.9 % IV SOLN
150.0000 mg | Freq: Once | INTRAVENOUS | Status: AC
  Administered 2020-04-13: 150 mg via INTRAVENOUS
  Filled 2020-04-13: qty 150

## 2020-04-13 MED ORDER — SODIUM CHLORIDE 0.9 % IV SOLN
77.5000 mg/m2 | Freq: Once | INTRAVENOUS | Status: AC
  Administered 2020-04-13: 150 mg via INTRAVENOUS
  Filled 2020-04-13: qty 150

## 2020-04-13 MED ORDER — SODIUM CHLORIDE 0.9 % IV SOLN
500.0000 mg/m2 | Freq: Once | INTRAVENOUS | Status: AC
  Administered 2020-04-13: 1000 mg via INTRAVENOUS
  Filled 2020-04-13: qty 40

## 2020-04-13 MED ORDER — MAGNESIUM SULFATE 2 GM/50ML IV SOLN
INTRAVENOUS | Status: AC
  Filled 2020-04-13: qty 50

## 2020-04-13 MED ORDER — SODIUM CHLORIDE 0.9 % IV SOLN
Freq: Once | INTRAVENOUS | Status: AC
  Filled 2020-04-13: qty 250

## 2020-04-13 MED ORDER — PALONOSETRON HCL INJECTION 0.25 MG/5ML
INTRAVENOUS | Status: AC
  Filled 2020-04-13: qty 5

## 2020-04-13 MED ORDER — PALONOSETRON HCL INJECTION 0.25 MG/5ML
0.2500 mg | Freq: Once | INTRAVENOUS | Status: AC
  Administered 2020-04-13: 0.25 mg via INTRAVENOUS

## 2020-04-13 MED ORDER — SODIUM CHLORIDE 0.9 % IV SOLN
Freq: Once | INTRAVENOUS | Status: AC
Start: 2020-04-13 — End: 2020-04-13
  Filled 2020-04-13: qty 250

## 2020-04-13 MED ORDER — POTASSIUM CHLORIDE 10 MEQ/100ML IV SOLN: 10.0000 meq | INTRAVENOUS | Status: DC

## 2020-04-13 MED ORDER — POTASSIUM CHLORIDE IN NACL 20-0.9 MEQ/L-% IV SOLN
Freq: Once | INTRAVENOUS | Status: AC
  Filled 2020-04-13: qty 1000

## 2020-04-13 MED ORDER — MAGNESIUM SULFATE 2 GM/50ML IV SOLN
2.0000 g | Freq: Once | INTRAVENOUS | Status: AC
  Administered 2020-04-13: 2 g via INTRAVENOUS

## 2020-04-13 NOTE — Progress Notes (Signed)
[facility] [phone]   [phone]  OFFICE PROGRESS NOTE  [name], MD [address] [place] [address]  DIAGNOSIS: Stage IIIb (T3, N2, M0) non-small cell lung cancer, adenocarcinoma presented with right upper lobe lung mass in addition to right hilar and mediastinal lymphadenopathy. Diagnosed in December 08, 2019.  Molecular Studies: Negative for actionable mutations  PDL1: 1-49%  PRIOR THERAPY:  1) Right upper lobectomy with lymph node sampling under the care of Dr. Kipp Brood on December 08, 2019.  CURRENT THERAPY: Adjuvant systemic chemotherapy with Cisplatin 75 mg/m2 and Alimta 500 mg/m2 IV every 3 weeks. First dose expected on 02/04/20.  Status post 3 cycles.  INTERVAL HISTORY: Marcus [name] 75 y.o. male returns to the clinic today for follow-up visit accompanied by his wife.  The patient is feeling fine today with no concerning complaints.  He tolerated cycle #3 of his treatment fairly well.  He denied having any current chest pain, shortness of breath, cough or hemoptysis.  He denied having any fever or chills.  He has no nausea, vomiting, diarrhea or constipation.  He was treated for cellulitis of the right upper extremity after chemo infusion.  The patient is here today for evaluation before starting cycle #4 of his chemotherapy.  MEDICAL HISTORY: Past Medical History:  Diagnosis Date  . Atrial fibrillation ([place])   . Dyspnea    with exertion   . Dysrhythmia   . Hypertension   . Obstructive sleep apnea 10/25/2007   cpap  . S/P AVR    2D ECHO, 04/25/2011 - EF >55%, Right ventricle-mild-moderately dilated  . Swelling of limb    LEA VENOUS, 08/22/2009 - no evidence of deep vein or superficial thrombosis; partially rupturing Baker's Cyst    ALLERGIES:  is allergic to amlodipine, sulfa antibiotics, tetanus toxoids, crestor [rosuvastatin], and penicillins.  MEDICATIONS:  Current Outpatient Medications  Medication  Sig Dispense Refill  . acetaminophen (TYLENOL) 325 MG tablet Take 2 tablets (650 mg total) by mouth every 6 (six) hours as needed for mild pain.    Marland Kitchen ALPRAZolam (XANAX) 0.25 MG tablet Take 1 tablet (0.25 mg total) by mouth every 8 (eight) hours as needed for up to 20 doses for anxiety. 20 tablet 0  . aspirin EC 81 MG tablet Take 81 mg by mouth daily.    Marland Kitchen atorvastatin (LIPITOR) 80 MG tablet TAKE 1 TABLET BY MOUTH EVERY DAY (Patient taking differently: Take 80 mg by mouth every evening. ) 90 tablet 2  . carboxymethylcellulose (REFRESH PLUS) 0.5 % SOLN Place 1 drop into both eyes daily as needed (dry eyes).     Marland Kitchen dexamethasone (DECADRON) 4 MG tablet Please take 1 tablet twice daily the day before, the day of, and the day after chemotherapy 40 tablet 2  . enoxaparin (LOVENOX) 120 MG/0.8ML injection Inject 1 syringe (120 mg) once daily as directed by coumadin clinic 8 mL 0  . fexofenadine (ALLEGRA) 180 MG tablet Take 180 mg by mouth every evening.    . folic acid (FOLVITE) 1 MG tablet Take 1 tablet (1 mg total) by mouth daily. 30 tablet 2  . furosemide (LASIX) 20 MG tablet Take 1 tablet as needed for swelling 30 tablet 3  . Multiple Vitamins-Minerals (EYE SUPPORT PO) Take 1 tablet by mouth daily.    . NONFORMULARY OR COMPOUNDED ITEM Apply 2 sprays topically as directed. Testosterone Topical  Preparation 100 mg (Compounded by [facility])  Apply 2 clicks (50 mg) daily    . prochlorperazine (COMPAZINE) 10 MG tablet Take 1 tablet (10 mg total) by mouth every 6 (six) hours as needed. 30 tablet 2  . ramipril (ALTACE) 10 MG capsule TAKE 1 CAPSULE BY MOUTH EVERY DAY (Patient taking differently: Take 10 mg by mouth daily. ) 90 capsule 3  . TESTOSTERONE IL Pt uses TopiClick 563SL/HT    . warfarin (COUMADIN) 10 MG tablet TAKE 1 TO 1 AND 1/2 TABLETS DAILY AS DIRECTED BY COUMADIN CLINIC 135 tablet 1   No current facility-administered medications for this visit.    SURGICAL HISTORY:  Past  Surgical History:  Procedure Laterality Date  . CARDIAC CATHETERIZATION Bilateral 05/10/2007   Significant 1-vessel disease, severely dilated aortic root with moderate severe aortic insufficiency  . CARDIAC SURGERY    . CARDIOVERSION  08/02/2007   150 Joule biphasic shock with restoration of sinus rhythm. Heart rate 60.  . cataract surgery     . COLONOSCOPY WITH PROPOFOL N/A 12/15/2016   Procedure: COLONOSCOPY WITH PROPOFOL;  Surgeon: Carol Ada, MD;  Location: [facility];  Service: Endoscopy;  Laterality: N/A;  . EYE SURGERY    . INTERCOSTAL NERVE BLOCK Right 12/08/2019   Procedure: INTERCOSTAL NERVE BLOCK;  Surgeon: [name], MD;  Location: [facility];  Service: Thoracic;  Laterality: Right;  . NM MYOVIEW LTD  04/08/2007   No evidence of inducible myocardial ischemia  . NODE DISSECTION Right 12/08/2019   Procedure: NODE DISSECTION;  Surgeon: [name], MD;  Location: [facility];  Service: Thoracic;  Laterality: Right;  . THYROIDECTOMY, PARTIAL    . torn meniscus in right knee surgery       REVIEW OF SYSTEMS:  A comprehensive review of systems was negative except for: Constitutional: positive for fatigue   PHYSICAL EXAMINATION: General appearance: alert, cooperative and no distress Head: Normocephalic, without obvious abnormality, atraumatic Neck: no adenopathy, no JVD, supple, symmetrical, trachea midline and thyroid not enlarged, symmetric, no tenderness/mass/nodules Lymph nodes: Cervical, supraclavicular, and axillary nodes normal. Resp: clear to auscultation bilaterally Back: symmetric, no curvature. ROM normal. No CVA tenderness. Cardio: regular rate and rhythm, S1, S2 normal, no murmur, click, rub or gallop GI: soft, non-tender; bowel sounds normal; no masses,  no organomegaly Extremities: extremities normal, atraumatic, no cyanosis or edema  ECOG PERFORMANCE STATUS: 1 - Symptomatic but completely ambulatory  Blood pressure (!) 143/80, pulse (!) 55, temperature  97.7 F (36.5 C), temperature source Tympanic, resp. rate 18, height 5' 8"  (1.727 m), weight 174 lb 6.4 oz (79.1 kg), SpO2 100 %.  LABORATORY DATA: Lab Results  Component Value Date   WBC 9.9 04/13/2020   HGB 9.7 (L) 04/13/2020   HCT 29.9 (L) 04/13/2020   MCV 95.8 04/13/2020   PLT 177 04/13/2020      Chemistry      Component Value Date/Time   NA 140 04/08/2020 0845   NA 139 10/28/2019 1057   K 4.5 04/08/2020 0845   CL 109 04/08/2020 0845   CO2 29 04/08/2020 0845   BUN 14 04/08/2020 0845   BUN 10 10/28/2019 1057   CREATININE 0.91 04/08/2020 0845      Component Value Date/Time   CALCIUM 8.6 (L) 04/08/2020 0845   ALKPHOS 95 04/08/2020 0845   AST 29 04/08/2020 0845   ALT 20 04/08/2020 0845   BILITOT 0.3 04/08/2020 0845       RADIOGRAPHIC STUDIES: No results found.  ASSESSMENT AND PLAN: This is a very pleasant 74  years old white male with a stage IIIb non-small cell lung cancer, adenocarcinoma presented with right upper lobe lung mass in addition to right hilar and mediastinal lymphadenopathy diagnosed in August 2021 with negative actionable mutation and PD-L1 expression in the range of 1-49%. The patient declined enrollment in the alliance clinical trial for treatment with systemic chemotherapy plus/minus immunotherapy. He is currently undergoing adjuvant systemic chemotherapy with cisplatin 75 mg/M2 and Alimta 500 mg/M2 every 3 weeks.  Status post 3 cycles. The patient tolerated the last cycle of his treatment well with no concerning adverse effects. I recommended for him to proceed with cycle #4 today as planned. He will come back for follow-up visit in around 5 weeks with repeat CT scan of the chest for restaging of his disease. The patient was advised to call immediately if he has any other concerning symptoms in the interval. The patient voices understanding of current disease status and treatment options and is in agreement with the current care plan.  All questions  were answered. The patient knows to call the clinic with any problems, questions or concerns. We can certainly see the patient much sooner if necessary.   Disclaimer: This note was dictated with voice recognition software. Similar sounding words can inadvertently be transcribed and may not be corrected upon review.

## 2020-04-13 NOTE — Patient Instructions (Addendum)
[facility] Discharge Instructions for Patients Receiving Chemotherapy  Today you received the following chemotherapy agents Pemetrexed (ALIMTA) & Cisplatin (PLATINOL).  To help prevent nausea and vomiting after your treatment, we encourage you to take your nausea medication as prescribed.   If you develop nausea and vomiting that is not controlled by your nausea medication, call the clinic.   BELOW ARE SYMPTOMS THAT SHOULD BE REPORTED IMMEDIATELY:  *FEVER GREATER THAN 100.5 F  *CHILLS WITH OR WITHOUT FEVER  NAUSEA AND VOMITING THAT IS NOT CONTROLLED WITH YOUR NAUSEA MEDICATION  *UNUSUAL SHORTNESS OF BREATH  *UNUSUAL BRUISING OR BLEEDING  TENDERNESS IN MOUTH AND THROAT WITH OR WITHOUT PRESENCE OF ULCERS  *URINARY PROBLEMS  *BOWEL PROBLEMS  UNUSUAL RASH Items with * indicate a potential emergency and should be followed up as soon as possible.  Feel free to call the clinic should you have any questions or concerns. The clinic phone number is [phone].  Please show the [facility] at check-in to the Emergency Department and triage nurse.

## 2020-04-14 ENCOUNTER — Telehealth: Payer: Self-pay | Admitting: Internal Medicine

## 2020-04-14 NOTE — Telephone Encounter (Signed)
Scheduled follow-up appointments per 1/3 los. Patient's wife is aware.

## 2020-04-15 ENCOUNTER — Inpatient Hospital Stay: Payer: Medicare HMO

## 2020-04-15 ENCOUNTER — Other Ambulatory Visit: Payer: Self-pay

## 2020-04-15 VITALS — BP 120/76 | HR 50 | Resp 18

## 2020-04-15 DIAGNOSIS — Z5189 Encounter for other specified aftercare: Secondary | ICD-10-CM | POA: Diagnosis not present

## 2020-04-15 DIAGNOSIS — C3490 Malignant neoplasm of unspecified part of unspecified bronchus or lung: Secondary | ICD-10-CM

## 2020-04-15 DIAGNOSIS — Z79899 Other long term (current) drug therapy: Secondary | ICD-10-CM | POA: Diagnosis not present

## 2020-04-15 DIAGNOSIS — C3411 Malignant neoplasm of upper lobe, right bronchus or lung: Secondary | ICD-10-CM | POA: Diagnosis not present

## 2020-04-15 DIAGNOSIS — Z5111 Encounter for antineoplastic chemotherapy: Secondary | ICD-10-CM | POA: Diagnosis not present

## 2020-04-15 MED ORDER — PEGFILGRASTIM-CBQV 6 MG/0.6ML ~~LOC~~ SOSY
PREFILLED_SYRINGE | SUBCUTANEOUS | Status: AC
  Filled 2020-04-15: qty 0.6

## 2020-04-15 MED ORDER — PEGFILGRASTIM-CBQV 6 MG/0.6ML ~~LOC~~ SOSY
6.0000 mg | PREFILLED_SYRINGE | Freq: Once | SUBCUTANEOUS | Status: AC
  Administered 2020-04-15: 6 mg via SUBCUTANEOUS

## 2020-04-15 NOTE — Patient Instructions (Signed)

## 2020-04-16 ENCOUNTER — Ambulatory Visit (INDEPENDENT_AMBULATORY_CARE_PROVIDER_SITE_OTHER): Payer: Medicare HMO | Admitting: Cardiology

## 2020-04-16 DIAGNOSIS — Z79899 Other long term (current) drug therapy: Secondary | ICD-10-CM

## 2020-04-16 DIAGNOSIS — Z952 Presence of prosthetic heart valve: Secondary | ICD-10-CM

## 2020-04-16 LAB — POCT INR: INR: 1.7 — AB (ref 2.0–3.0)

## 2020-04-19 ENCOUNTER — Ambulatory Visit (INDEPENDENT_AMBULATORY_CARE_PROVIDER_SITE_OTHER): Payer: Medicare HMO | Admitting: Cardiology

## 2020-04-19 DIAGNOSIS — Z79899 Other long term (current) drug therapy: Secondary | ICD-10-CM

## 2020-04-19 DIAGNOSIS — Z952 Presence of prosthetic heart valve: Secondary | ICD-10-CM

## 2020-04-19 LAB — POCT INR: INR: 2 (ref 2.0–3.0)

## 2020-04-23 ENCOUNTER — Other Ambulatory Visit: Payer: Self-pay

## 2020-04-23 ENCOUNTER — Inpatient Hospital Stay: Payer: Medicare HMO

## 2020-04-23 DIAGNOSIS — C3491 Malignant neoplasm of unspecified part of right bronchus or lung: Secondary | ICD-10-CM

## 2020-04-23 DIAGNOSIS — Z5189 Encounter for other specified aftercare: Secondary | ICD-10-CM | POA: Diagnosis not present

## 2020-04-23 DIAGNOSIS — Z79899 Other long term (current) drug therapy: Secondary | ICD-10-CM | POA: Diagnosis not present

## 2020-04-23 DIAGNOSIS — C3411 Malignant neoplasm of upper lobe, right bronchus or lung: Secondary | ICD-10-CM | POA: Diagnosis not present

## 2020-04-23 DIAGNOSIS — Z5111 Encounter for antineoplastic chemotherapy: Secondary | ICD-10-CM | POA: Diagnosis not present

## 2020-04-23 LAB — CMP (CANCER CENTER ONLY)
ALT: 21 U/L (ref 0–44)
AST: 27 U/L (ref 15–41)
Albumin: 3.7 g/dL (ref 3.5–5.0)
Alkaline Phosphatase: 130 U/L — ABNORMAL HIGH (ref 38–126)
Anion gap: 5 (ref 5–15)
BUN: 19 mg/dL (ref 8–23)
CO2: 25 mmol/L (ref 22–32)
Calcium: 8.6 mg/dL — ABNORMAL LOW (ref 8.9–10.3)
Chloride: 107 mmol/L (ref 98–111)
Creatinine: 1 mg/dL (ref 0.61–1.24)
GFR, Estimated: >60 mL/min (ref >60)
Glucose, Bld: 84 mg/dL (ref 70–99)
Potassium: 5.1 mmol/L (ref 3.5–5.1)
Sodium: 137 mmol/L (ref 135–145)
Total Bilirubin: 0.6 mg/dL (ref 0.3–1.2)
Total Protein: 6.4 g/dL — ABNORMAL LOW (ref 6.5–8.1)

## 2020-04-23 LAB — CBC WITH DIFFERENTIAL (CANCER CENTER ONLY)
Abs Immature Granulocytes: 0.06 10*3/uL (ref 0.00–0.07)
Basophils Absolute: 0 10*3/uL (ref 0.0–0.1)
Basophils Relative: 0 %
Eosinophils Absolute: 0 10*3/uL (ref 0.0–0.5)
Eosinophils Relative: 0 %
HCT: 28.2 % — ABNORMAL LOW (ref 39.0–52.0)
Hemoglobin: 9.2 g/dL — ABNORMAL LOW (ref 13.0–17.0)
Immature Granulocytes: 1 %
Lymphocytes Relative: 10 %
Lymphs Abs: 1.1 10*3/uL (ref 0.7–4.0)
MCH: 31.8 pg (ref 26.0–34.0)
MCHC: 32.6 g/dL (ref 30.0–36.0)
MCV: 97.6 fL (ref 80.0–100.0)
Monocytes Absolute: 0.7 10*3/uL (ref 0.1–1.0)
Monocytes Relative: 7 %
Neutro Abs: 8.9 10*3/uL — ABNORMAL HIGH (ref 1.7–7.7)
Neutrophils Relative %: 82 %
Platelet Count: 98 10*3/uL — ABNORMAL LOW (ref 150–400)
RBC: 2.89 MIL/uL — ABNORMAL LOW (ref 4.22–5.81)
RDW: 18.2 % — ABNORMAL HIGH (ref 11.5–15.5)
WBC Count: 10.8 10*3/uL — ABNORMAL HIGH (ref 4.0–10.5)
nRBC: 0 % (ref 0.0–0.2)

## 2020-04-23 LAB — MAGNESIUM: Magnesium: 1.7 mg/dL (ref 1.7–2.4)

## 2020-04-24 ENCOUNTER — Other Ambulatory Visit: Payer: Self-pay | Admitting: Physician Assistant

## 2020-04-24 DIAGNOSIS — C3491 Malignant neoplasm of unspecified part of right bronchus or lung: Secondary | ICD-10-CM

## 2020-04-26 ENCOUNTER — Ambulatory Visit (INDEPENDENT_AMBULATORY_CARE_PROVIDER_SITE_OTHER): Payer: Medicare HMO | Admitting: Pharmacist Clinician (PhC)/ Clinical Pharmacy Specialist

## 2020-04-26 DIAGNOSIS — Z7901 Long term (current) use of anticoagulants: Secondary | ICD-10-CM | POA: Diagnosis not present

## 2020-04-26 DIAGNOSIS — Z952 Presence of prosthetic heart valve: Secondary | ICD-10-CM

## 2020-04-26 LAB — POCT INR: INR: 1.6 — AB (ref 2.0–3.0)

## 2020-04-27 ENCOUNTER — Telehealth: Payer: Self-pay | Admitting: *Deleted

## 2020-04-27 DIAGNOSIS — L570 Actinic keratosis: Secondary | ICD-10-CM | POA: Diagnosis not present

## 2020-04-27 DIAGNOSIS — X32XXXA Exposure to sunlight, initial encounter: Secondary | ICD-10-CM | POA: Diagnosis not present

## 2020-04-27 NOTE — Telephone Encounter (Signed)
Received vm call from pt stating that she has been trying to get through for quite a while & has not been able to so she called education office.  She is concerned about upcoming predicted weather & pt has appt on Fri & wants to know if he can come in early Thurs for labs instead.  She also asked if pt can take his calcium b/c his calcium was low.  She reported that Dr Julien Nordmann told him not to take.  Informed to not add vitamins back in until Dr Julien Nordmann agreed.  Message routed to Dr [name]/Pod RN

## 2020-04-28 NOTE — Telephone Encounter (Signed)
Yes. Ok to do lab on Thursday. He can resume his Calcium and Vitamins. Thank you

## 2020-04-29 ENCOUNTER — Other Ambulatory Visit: Payer: Self-pay

## 2020-04-29 ENCOUNTER — Inpatient Hospital Stay: Payer: Medicare HMO

## 2020-04-29 DIAGNOSIS — Z5189 Encounter for other specified aftercare: Secondary | ICD-10-CM | POA: Diagnosis not present

## 2020-04-29 DIAGNOSIS — C3490 Malignant neoplasm of unspecified part of unspecified bronchus or lung: Secondary | ICD-10-CM

## 2020-04-29 DIAGNOSIS — Z5111 Encounter for antineoplastic chemotherapy: Secondary | ICD-10-CM | POA: Diagnosis not present

## 2020-04-29 DIAGNOSIS — C3411 Malignant neoplasm of upper lobe, right bronchus or lung: Secondary | ICD-10-CM | POA: Diagnosis not present

## 2020-04-29 DIAGNOSIS — Z79899 Other long term (current) drug therapy: Secondary | ICD-10-CM | POA: Diagnosis not present

## 2020-04-29 LAB — CBC WITH DIFFERENTIAL (CANCER CENTER ONLY)
Abs Immature Granulocytes: 0.07 10*3/uL (ref 0.00–0.07)
Basophils Absolute: 0 10*3/uL (ref 0.0–0.1)
Basophils Relative: 0 %
Eosinophils Absolute: 0.1 10*3/uL (ref 0.0–0.5)
Eosinophils Relative: 1 %
HCT: 29.7 % — ABNORMAL LOW (ref 39.0–52.0)
Hemoglobin: 9.6 g/dL — ABNORMAL LOW (ref 13.0–17.0)
Immature Granulocytes: 1 %
Lymphocytes Relative: 15 %
Lymphs Abs: 1.3 10*3/uL (ref 0.7–4.0)
MCH: 31.6 pg (ref 26.0–34.0)
MCHC: 32.3 g/dL (ref 30.0–36.0)
MCV: 97.7 fL (ref 80.0–100.0)
Monocytes Absolute: 0.5 10*3/uL (ref 0.1–1.0)
Monocytes Relative: 5 %
Neutro Abs: 7.1 10*3/uL (ref 1.7–7.7)
Neutrophils Relative %: 78 %
Platelet Count: 130 10*3/uL — ABNORMAL LOW (ref 150–400)
RBC: 3.04 MIL/uL — ABNORMAL LOW (ref 4.22–5.81)
RDW: 19 % — ABNORMAL HIGH (ref 11.5–15.5)
WBC Count: 9.1 10*3/uL (ref 4.0–10.5)
nRBC: 0 % (ref 0.0–0.2)

## 2020-04-29 LAB — CMP (CANCER CENTER ONLY)
ALT: 14 U/L (ref 0–44)
AST: 24 U/L (ref 15–41)
Albumin: 3.7 g/dL (ref 3.5–5.0)
Alkaline Phosphatase: 108 U/L (ref 38–126)
Anion gap: 6 (ref 5–15)
BUN: 14 mg/dL (ref 8–23)
CO2: 28 mmol/L (ref 22–32)
Calcium: 8.8 mg/dL — ABNORMAL LOW (ref 8.9–10.3)
Chloride: 108 mmol/L (ref 98–111)
Creatinine: 1.04 mg/dL (ref 0.61–1.24)
GFR, Estimated: >60 mL/min
Glucose, Bld: 69 mg/dL — ABNORMAL LOW (ref 70–99)
Potassium: 5 mmol/L (ref 3.5–5.1)
Sodium: 142 mmol/L (ref 135–145)
Total Bilirubin: 0.4 mg/dL (ref 0.3–1.2)
Total Protein: 6.4 g/dL — ABNORMAL LOW (ref 6.5–8.1)

## 2020-04-29 LAB — MAGNESIUM: Magnesium: 1.9 mg/dL (ref 1.7–2.4)

## 2020-04-30 ENCOUNTER — Other Ambulatory Visit: Payer: Medicare HMO

## 2020-05-03 ENCOUNTER — Ambulatory Visit (INDEPENDENT_AMBULATORY_CARE_PROVIDER_SITE_OTHER): Payer: Medicare HMO | Admitting: Pharmacist Clinician (PhC)/ Clinical Pharmacy Specialist

## 2020-05-03 DIAGNOSIS — Z79899 Other long term (current) drug therapy: Secondary | ICD-10-CM | POA: Diagnosis not present

## 2020-05-03 DIAGNOSIS — Z7901 Long term (current) use of anticoagulants: Secondary | ICD-10-CM | POA: Diagnosis not present

## 2020-05-03 DIAGNOSIS — Z952 Presence of prosthetic heart valve: Secondary | ICD-10-CM

## 2020-05-03 LAB — POCT INR: INR: 1.3 — AB (ref 2.0–3.0)

## 2020-05-04 ENCOUNTER — Telehealth: Payer: Self-pay

## 2020-05-04 NOTE — Telephone Encounter (Signed)
Jasmine calling from [facility] to report critical INR of 1.3 from 05/03/2020. Tommy Medal, Pharm D spoke with patient and provided instructions.

## 2020-05-05 ENCOUNTER — Telehealth: Payer: Self-pay | Admitting: Cardiovascular Disease

## 2020-05-05 ENCOUNTER — Other Ambulatory Visit: Payer: Self-pay | Admitting: Medical Oncology

## 2020-05-05 DIAGNOSIS — Z5111 Encounter for antineoplastic chemotherapy: Secondary | ICD-10-CM

## 2020-05-05 DIAGNOSIS — C3490 Malignant neoplasm of unspecified part of unspecified bronchus or lung: Secondary | ICD-10-CM

## 2020-05-05 NOTE — Telephone Encounter (Signed)
New message:     Patient wife calling stating that she was trying to order sleep supply and they will be in [place] on Friday.

## 2020-05-06 NOTE — Telephone Encounter (Signed)
Patient's wife is following up regarding her request for an order from Dr. Claiborne Billings for additional sleep supplies (see patient message). Please return call with updates.

## 2020-05-06 NOTE — Telephone Encounter (Signed)
Returned a call to patient she wanted to confirm that orders have been sent to [facility] for supplies. I told her the order was faxed yesterday. I spoke with Angie and confirmed order CMN has been received.

## 2020-05-07 ENCOUNTER — Inpatient Hospital Stay: Payer: Medicare HMO

## 2020-05-07 ENCOUNTER — Other Ambulatory Visit: Payer: Self-pay

## 2020-05-07 DIAGNOSIS — Z5189 Encounter for other specified aftercare: Secondary | ICD-10-CM | POA: Diagnosis not present

## 2020-05-07 DIAGNOSIS — C3411 Malignant neoplasm of upper lobe, right bronchus or lung: Secondary | ICD-10-CM | POA: Diagnosis not present

## 2020-05-07 DIAGNOSIS — Z5111 Encounter for antineoplastic chemotherapy: Secondary | ICD-10-CM

## 2020-05-07 DIAGNOSIS — G4733 Obstructive sleep apnea (adult) (pediatric): Secondary | ICD-10-CM | POA: Diagnosis not present

## 2020-05-07 DIAGNOSIS — C3490 Malignant neoplasm of unspecified part of unspecified bronchus or lung: Secondary | ICD-10-CM

## 2020-05-07 DIAGNOSIS — Z79899 Other long term (current) drug therapy: Secondary | ICD-10-CM | POA: Diagnosis not present

## 2020-05-07 LAB — CMP (CANCER CENTER ONLY)
ALT: 10 U/L (ref 0–44)
AST: 22 U/L (ref 15–41)
Albumin: 3.8 g/dL (ref 3.5–5.0)
Alkaline Phosphatase: 79 U/L (ref 38–126)
Anion gap: 5 (ref 5–15)
BUN: 19 mg/dL (ref 8–23)
CO2: 28 mmol/L (ref 22–32)
Calcium: 8.7 mg/dL — ABNORMAL LOW (ref 8.9–10.3)
Chloride: 106 mmol/L (ref 98–111)
Creatinine: 1.1 mg/dL (ref 0.61–1.24)
GFR, Estimated: >60 mL/min (ref >60)
Glucose, Bld: 66 mg/dL — ABNORMAL LOW (ref 70–99)
Potassium: 5.2 mmol/L — ABNORMAL HIGH (ref 3.5–5.1)
Sodium: 139 mmol/L (ref 135–145)
Total Bilirubin: 0.4 mg/dL (ref 0.3–1.2)
Total Protein: 6.4 g/dL — ABNORMAL LOW (ref 6.5–8.1)

## 2020-05-07 LAB — CBC WITH DIFFERENTIAL (CANCER CENTER ONLY)
Abs Immature Granulocytes: 0.02 10*3/uL (ref 0.00–0.07)
Basophils Absolute: 0 10*3/uL (ref 0.0–0.1)
Basophils Relative: 1 %
Eosinophils Absolute: 0 10*3/uL (ref 0.0–0.5)
Eosinophils Relative: 1 %
HCT: 29.1 % — ABNORMAL LOW (ref 39.0–52.0)
Hemoglobin: 9.5 g/dL — ABNORMAL LOW (ref 13.0–17.0)
Immature Granulocytes: 0 %
Lymphocytes Relative: 22 %
Lymphs Abs: 1.1 10*3/uL (ref 0.7–4.0)
MCH: 31.9 pg (ref 26.0–34.0)
MCHC: 32.6 g/dL (ref 30.0–36.0)
MCV: 97.7 fL (ref 80.0–100.0)
Monocytes Absolute: 0.5 10*3/uL (ref 0.1–1.0)
Monocytes Relative: 10 %
Neutro Abs: 3.4 10*3/uL (ref 1.7–7.7)
Neutrophils Relative %: 66 %
Platelet Count: 150 10*3/uL (ref 150–400)
RBC: 2.98 MIL/uL — ABNORMAL LOW (ref 4.22–5.81)
RDW: 19 % — ABNORMAL HIGH (ref 11.5–15.5)
WBC Count: 5.1 10*3/uL (ref 4.0–10.5)
nRBC: 0 % (ref 0.0–0.2)

## 2020-05-07 LAB — MAGNESIUM: Magnesium: 2 mg/dL (ref 1.7–2.4)

## 2020-05-07 LAB — POCT INR: INR: 2.3 (ref 2.0–3.0)

## 2020-05-10 ENCOUNTER — Ambulatory Visit (INDEPENDENT_AMBULATORY_CARE_PROVIDER_SITE_OTHER): Payer: Medicare HMO | Admitting: Internal Medicine

## 2020-05-10 DIAGNOSIS — Z952 Presence of prosthetic heart valve: Secondary | ICD-10-CM

## 2020-05-10 DIAGNOSIS — Z79899 Other long term (current) drug therapy: Secondary | ICD-10-CM | POA: Diagnosis not present

## 2020-05-14 ENCOUNTER — Ambulatory Visit (INDEPENDENT_AMBULATORY_CARE_PROVIDER_SITE_OTHER): Payer: Medicare HMO | Admitting: Internal Medicine

## 2020-05-14 ENCOUNTER — Ambulatory Visit (HOSPITAL_COMMUNITY)
Admission: RE | Admit: 2020-05-14 | Discharge: 2020-05-14 | Disposition: A | Discharge status: Home / Self Care | Payer: Medicare HMO | Source: Ambulatory Visit | Attending: Internal Medicine | Admitting: Internal Medicine

## 2020-05-14 ENCOUNTER — Encounter (HOSPITAL_COMMUNITY): Payer: Self-pay

## 2020-05-14 ENCOUNTER — Inpatient Hospital Stay: Payer: Medicare HMO | Attending: Internal Medicine

## 2020-05-14 ENCOUNTER — Other Ambulatory Visit: Payer: Self-pay

## 2020-05-14 DIAGNOSIS — J439 Emphysema, unspecified: Secondary | ICD-10-CM | POA: Diagnosis not present

## 2020-05-14 DIAGNOSIS — Z952 Presence of prosthetic heart valve: Secondary | ICD-10-CM | POA: Diagnosis not present

## 2020-05-14 DIAGNOSIS — Z79899 Other long term (current) drug therapy: Secondary | ICD-10-CM | POA: Insufficient documentation

## 2020-05-14 DIAGNOSIS — C3411 Malignant neoplasm of upper lobe, right bronchus or lung: Secondary | ICD-10-CM | POA: Diagnosis not present

## 2020-05-14 DIAGNOSIS — C3490 Malignant neoplasm of unspecified part of unspecified bronchus or lung: Secondary | ICD-10-CM | POA: Diagnosis not present

## 2020-05-14 DIAGNOSIS — Z902 Acquired absence of lung [part of]: Secondary | ICD-10-CM | POA: Insufficient documentation

## 2020-05-14 DIAGNOSIS — Z7901 Long term (current) use of anticoagulants: Secondary | ICD-10-CM

## 2020-05-14 DIAGNOSIS — I2510 Atherosclerotic heart disease of native coronary artery without angina pectoris: Secondary | ICD-10-CM | POA: Diagnosis not present

## 2020-05-14 DIAGNOSIS — J90 Pleural effusion, not elsewhere classified: Secondary | ICD-10-CM | POA: Diagnosis not present

## 2020-05-14 LAB — CMP (CANCER CENTER ONLY)
ALT: 15 U/L (ref 0–44)
AST: 28 U/L (ref 15–41)
Albumin: 4.1 g/dL (ref 3.5–5.0)
Alkaline Phosphatase: 71 U/L (ref 38–126)
Anion gap: 7 (ref 5–15)
BUN: 17 mg/dL (ref 8–23)
CO2: 25 mmol/L (ref 22–32)
Calcium: 8.8 mg/dL — ABNORMAL LOW (ref 8.9–10.3)
Chloride: 108 mmol/L (ref 98–111)
Creatinine: 0.94 mg/dL (ref 0.61–1.24)
GFR, Estimated: >60 mL/min (ref >60)
Glucose, Bld: 95 mg/dL (ref 70–99)
Potassium: 5.2 mmol/L — ABNORMAL HIGH (ref 3.5–5.1)
Sodium: 140 mmol/L (ref 135–145)
Total Bilirubin: 0.6 mg/dL (ref 0.3–1.2)
Total Protein: 6.7 g/dL (ref 6.5–8.1)

## 2020-05-14 LAB — CBC WITH DIFFERENTIAL (CANCER CENTER ONLY)
Abs Immature Granulocytes: 0.01 10*3/uL (ref 0.00–0.07)
Basophils Absolute: 0 10*3/uL (ref 0.0–0.1)
Basophils Relative: 1 %
Eosinophils Absolute: 0.1 10*3/uL (ref 0.0–0.5)
Eosinophils Relative: 1 %
HCT: 29 % — ABNORMAL LOW (ref 39.0–52.0)
Hemoglobin: 9.4 g/dL — ABNORMAL LOW (ref 13.0–17.0)
Immature Granulocytes: 0 %
Lymphocytes Relative: 19 %
Lymphs Abs: 1.2 10*3/uL (ref 0.7–4.0)
MCH: 32.1 pg (ref 26.0–34.0)
MCHC: 32.4 g/dL (ref 30.0–36.0)
MCV: 99 fL (ref 80.0–100.0)
Monocytes Absolute: 0.4 10*3/uL (ref 0.1–1.0)
Monocytes Relative: 6 %
Neutro Abs: 4.8 10*3/uL (ref 1.7–7.7)
Neutrophils Relative %: 73 %
Platelet Count: 148 10*3/uL — ABNORMAL LOW (ref 150–400)
RBC: 2.93 MIL/uL — ABNORMAL LOW (ref 4.22–5.81)
RDW: 18.7 % — ABNORMAL HIGH (ref 11.5–15.5)
WBC Count: 6.5 10*3/uL (ref 4.0–10.5)
nRBC: 0 % (ref 0.0–0.2)

## 2020-05-14 LAB — POCT INR: INR: 2.4 (ref 2.0–3.0)

## 2020-05-14 MED ORDER — IOHEXOL 300 MG/ML  SOLN
75.0000 mL | Freq: Once | INTRAMUSCULAR | Status: AC | PRN
  Administered 2020-05-14: 75 mL via INTRAVENOUS

## 2020-05-17 ENCOUNTER — Inpatient Hospital Stay: Payer: Medicare HMO | Admitting: Internal Medicine

## 2020-05-17 ENCOUNTER — Other Ambulatory Visit: Payer: Self-pay

## 2020-05-17 VITALS — BP 142/75 | HR 56 | Temp 97.2°F | Resp 18 | Ht 68.0 in | Wt 173.9 lb

## 2020-05-17 DIAGNOSIS — C3491 Malignant neoplasm of unspecified part of right bronchus or lung: Secondary | ICD-10-CM | POA: Diagnosis not present

## 2020-05-17 DIAGNOSIS — C3490 Malignant neoplasm of unspecified part of unspecified bronchus or lung: Secondary | ICD-10-CM

## 2020-05-17 DIAGNOSIS — Z79899 Other long term (current) drug therapy: Secondary | ICD-10-CM | POA: Diagnosis not present

## 2020-05-17 DIAGNOSIS — C3411 Malignant neoplasm of upper lobe, right bronchus or lung: Secondary | ICD-10-CM | POA: Diagnosis not present

## 2020-05-17 DIAGNOSIS — Z902 Acquired absence of lung [part of]: Secondary | ICD-10-CM | POA: Diagnosis not present

## 2020-05-17 NOTE — Progress Notes (Signed)
Marcus [name] [phone]   [phone]  OFFICE PROGRESS NOTE  [name], MD [address] [place] [address]  DIAGNOSIS: Stage IIIb (T3, N2, M0) non-small cell lung cancer, adenocarcinoma presented with right upper lobe lung mass in addition to right hilar and mediastinal lymphadenopathy. Diagnosed in December 08, 2019.  Molecular Studies: Negative for actionable mutations  PDL1: 1-49%  PRIOR THERAPY:  1) Right upper lobectomy with lymph node sampling under the care of Dr. Kipp Brood on December 08, 2019.  CURRENT THERAPY: Adjuvant systemic chemotherapy with Cisplatin 75 mg/m2 and Alimta 500 mg/m2 IV every 3 weeks. First dose expected on 02/04/20.  Status post 3 cycles.  INTERVAL HISTORY: Marcus [name] 75 y.o. male returns to the clinic today for follow-up visit accompanied by his wife.  The patient is feeling fine today with no concerning complaints except for occasional numbness in the right side of the chest.  He denied having any chest pain, shortness of breath, cough or hemoptysis.  He denied having any fever or chills.  He has no nausea, vomiting, diarrhea or constipation.  He has no headache or visual changes.  The patient denied having any weight loss or night sweats.  He had repeat CT scan of the chest performed recently and is here for evaluation and discussion of his discuss results.  MEDICAL HISTORY: Past Medical History:  Diagnosis Date  . Atrial fibrillation ([place])   . Dyspnea    with exertion   . Dysrhythmia   . Hypertension   . Obstructive sleep apnea 10/25/2007   cpap  . S/P AVR    2D ECHO, 04/25/2011 - EF >55%, Right ventricle-mild-moderately dilated  . Swelling of limb    LEA VENOUS, 08/22/2009 - no evidence of deep vein or superficial thrombosis; partially rupturing Baker's Cyst    ALLERGIES:  is allergic to amlodipine, sulfa antibiotics, tetanus toxoids, crestor [rosuvastatin], and  penicillins.  MEDICATIONS:  Current Outpatient Medications  Medication Sig Dispense Refill  . acetaminophen (TYLENOL) 325 MG tablet Take 2 tablets (650 mg total) by mouth every 6 (six) hours as needed for mild pain.    Marland Kitchen ALPRAZolam (XANAX) 0.25 MG tablet Take 1 tablet (0.25 mg total) by mouth every 8 (eight) hours as needed for up to 20 doses for anxiety. 20 tablet 0  . aspirin EC 81 MG tablet Take 81 mg by mouth daily.    Marland Kitchen atorvastatin (LIPITOR) 80 MG tablet TAKE 1 TABLET BY MOUTH EVERY DAY (Patient taking differently: Take 80 mg by mouth every evening.) 90 tablet 2  . carboxymethylcellulose (REFRESH PLUS) 0.5 % SOLN Place 1 drop into both eyes daily as needed (dry eyes).     Marland Kitchen dexamethasone (DECADRON) 4 MG tablet Please take 1 tablet twice daily the day before, the day of, and the day after chemotherapy 40 tablet 2  . enoxaparin (LOVENOX) 120 MG/0.8ML injection Inject 1 syringe (120 mg) once daily as directed by coumadin clinic 8 mL 0  . fexofenadine (ALLEGRA) 180 MG tablet Take 180 mg by mouth every evening.    . folic acid (FOLVITE) 1 MG tablet TAKE 1 TABLET BY MOUTH EVERY DAY 30 tablet 0  . furosemide (LASIX) 20 MG tablet Take 1 tablet as needed for swelling 30 tablet 3  . Multiple Vitamins-Minerals (EYE SUPPORT PO) Take 1 tablet by mouth daily.    . NONFORMULARY OR COMPOUNDED ITEM Apply 2 sprays topically as directed. Testosterone Topical  Preparation  100 mg (Compounded by [facility])  Apply 2 clicks (50 mg) daily    . prochlorperazine (COMPAZINE) 10 MG tablet Take 1 tablet (10 mg total) by mouth every 6 (six) hours as needed. 30 tablet 2  . ramipril (ALTACE) 10 MG capsule TAKE 1 CAPSULE BY MOUTH EVERY DAY (Patient taking differently: Take 10 mg by mouth daily.) 90 capsule 3  . TESTOSTERONE IL Pt uses TopiClick 748OL/MB    . warfarin (COUMADIN) 10 MG tablet TAKE 1 TO 1 AND 1/2 TABLETS DAILY AS DIRECTED BY COUMADIN CLINIC 135 tablet 1   No current  facility-administered medications for this visit.    SURGICAL HISTORY:  Past Surgical History:  Procedure Laterality Date  . CARDIAC CATHETERIZATION Bilateral 05/10/2007   Significant 1-vessel disease, severely dilated aortic root with moderate severe aortic insufficiency  . CARDIAC SURGERY    . CARDIOVERSION  08/02/2007   150 Joule biphasic shock with restoration of sinus rhythm. Heart rate 60.  . cataract surgery     . COLONOSCOPY WITH PROPOFOL N/A 12/15/2016   Procedure: COLONOSCOPY WITH PROPOFOL;  Surgeon: Carol Ada, MD;  Location: [facility];  Service: Endoscopy;  Laterality: N/A;  . EYE SURGERY    . INTERCOSTAL NERVE BLOCK Right 12/08/2019   Procedure: INTERCOSTAL NERVE BLOCK;  Surgeon: [name], MD;  Location: [facility];  Service: Thoracic;  Laterality: Right;  . NM MYOVIEW LTD  04/08/2007   No evidence of inducible myocardial ischemia  . NODE DISSECTION Right 12/08/2019   Procedure: NODE DISSECTION;  Surgeon: [name], MD;  Location: [facility];  Service: Thoracic;  Laterality: Right;  . THYROIDECTOMY, PARTIAL    . torn meniscus in right knee surgery       REVIEW OF SYSTEMS:  Constitutional: negative Eyes: negative Ears, nose, mouth, throat, and face: negative Respiratory: positive for pleurisy/chest pain Cardiovascular: negative Gastrointestinal: negative Genitourinary:negative Integument/breast: negative Hematologic/lymphatic: negative Musculoskeletal:negative Neurological: negative Behavioral/Psych: negative Endocrine: negative Allergic/Immunologic: negative   PHYSICAL EXAMINATION: General appearance: alert, cooperative and no distress Head: Normocephalic, without obvious abnormality, atraumatic Neck: no adenopathy, no JVD, supple, symmetrical, trachea midline and thyroid not enlarged, symmetric, no tenderness/mass/nodules Lymph nodes: Cervical, supraclavicular, and axillary nodes normal. Resp: clear to auscultation bilaterally Back: symmetric,  no curvature. ROM normal. No CVA tenderness. Cardio: regular rate and rhythm, S1, S2 normal, no murmur, click, rub or gallop GI: soft, non-tender; bowel sounds normal; no masses,  no organomegaly Extremities: extremities normal, atraumatic, no cyanosis or edema Neurologic: Alert and oriented X 3, normal strength and tone. Normal symmetric reflexes. Normal coordination and gait  ECOG PERFORMANCE STATUS: 1 - Symptomatic but completely ambulatory  Blood pressure (!) 142/75, pulse (!) 56, temperature (!) 97.2 F (36.2 C), temperature source Tympanic, resp. rate 18, height 5' 8"  (1.727 m), weight 173 lb 14.4 oz (78.9 kg), SpO2 98 %.  LABORATORY DATA: Lab Results  Component Value Date   WBC 6.5 05/14/2020   HGB 9.4 (L) 05/14/2020   HCT 29.0 (L) 05/14/2020   MCV 99.0 05/14/2020   PLT 148 (L) 05/14/2020      Chemistry      Component Value Date/Time   NA 140 05/14/2020 1336   NA 139 10/28/2019 1057   K 5.2 (H) 05/14/2020 1336   CL 108 05/14/2020 1336   CO2 25 05/14/2020 1336   BUN 17 05/14/2020 1336   BUN 10 10/28/2019 1057   CREATININE 0.94 05/14/2020 1336      Component Value Date/Time   CALCIUM  8.8 (L) 05/14/2020 1336   ALKPHOS 71 05/14/2020 1336   AST 28 05/14/2020 1336   ALT 15 05/14/2020 1336   BILITOT 0.6 05/14/2020 1336       RADIOGRAPHIC STUDIES: CT Chest W Contrast  Result Date: 05/15/2020 CLINICAL DATA:  Non-small cell lung cancer staging. Follow-up evaluation post RIGHT upper lobectomy. EXAM: CT CHEST WITH CONTRAST TECHNIQUE: Multidetector CT imaging of the chest was performed during intravenous contrast administration. CONTRAST:  11m OMNIPAQUE IOHEXOL 300 MG/ML  SOLN COMPARISON:  11/06/2019 FINDINGS: Cardiovascular: Post median sternotomy for aortic valve replacement. Calcified and noncalcified atheromatous plaque along with signs of aortic arch repair as well. Finding similar to the prior study. Heart size stable. Three-vessel coronary artery disease. No pericardial  effusion. Central pulmonary vasculature normal caliber limited assessment on venous phase. Mediastinum/Nodes: Esophagus grossly normal. No mediastinal or hilar lymphadenopathy. No thoracic inlet lymphadenopathy. No axillary lymphadenopathy. Distortion of RIGHT hilar structures following RIGHT upper lobectomy. Lungs/Pleura: Small pleural effusion in the RIGHT chest following partial lung resection. Mild pulmonary emphysema as before. Small enhancing nodular focus in the RIGHT posterior pleural space on image 119 of series 2 13 x 6 mm. No new, branching nodules in the RIGHT lower lobe on image 77 through 87 have developed since the prior study. LEFT chest is clear. Airways are patent. Upper Abdomen: Incidental imaging of upper abdominal contents without acute process. Adrenal glands are normal. Small cyst in the RIGHT kidney. No upper abdominal lymphadenopathy to the extent evaluated. Musculoskeletal: No acute musculoskeletal process. Post median sternotomy. No destructive bone finding. IMPRESSION: 1. Small enhancing nodular focus in the RIGHT posterior pleural space on image 119 of series 2 measuring 13 x 6 mm, concerning for recurrent disease. PET scan may be helpful for further assessment. Postoperative changes wall considered would likely not be as focal or nodular. 2. No new, branching nodules in the RIGHT lower lobe have developed since the prior study. Findings are likely related to an infectious or inflammatory process. Attention on follow-up 3. Small RIGHT pleural effusion following partial lung resection. 4. Emphysema and aortic atherosclerosis. 5. Three-vessel coronary artery disease. Aortic Atherosclerosis (ICD10-I70.0) and Emphysema (ICD10-J43.9). Electronically Signed   By: GZetta BillsM.D.   On: 05/15/2020 15:44    ASSESSMENT AND PLAN: This is a very pleasant 75years old white male with a stage IIIb non-small cell lung cancer, adenocarcinoma presented with right upper lobe lung mass in addition  to right hilar and mediastinal lymphadenopathy diagnosed in August 2021 with negative actionable mutation and PD-L1 expression in the range of 1-49%. The patient declined enrollment in the alliance clinical trial for treatment with systemic chemotherapy plus/minus immunotherapy. He underwent adjuvant systemic chemotherapy with cisplatin 75 mg/M2 and Alimta 500 mg/M2 every 3 weeks.  Status post 4 cycles.  He tolerated his treatment fairly well with no concerning adverse effects. The patient had repeat CT scan of the chest performed recently.  I personally and independently reviewed the scan images and discussed the results with the patient and his wife today. His scan showed no concerning findings for disease recurrence or metastasis except for a suspicious small enhancing nodular focus in the right posterior pleural space that need close attention. I recommended for the patient to have repeat CT scan of the chest in around 3 months for reevaluation of this area and to rule out any disease recurrence but if he has any concerning symptoms will be happy to see him sooner for evaluation. The patient was advised to call  immediately if he has any other concerning symptoms in the interval. The patient voices understanding of current disease status and treatment options and is in agreement with the current care plan.  All questions were answered. The patient knows to call the clinic with any problems, questions or concerns. We can certainly see the patient much sooner if necessary.   Disclaimer: This note was dictated with voice recognition software. Similar sounding words can inadvertently be transcribed and may not be corrected upon review.

## 2020-05-18 ENCOUNTER — Telehealth: Payer: Self-pay | Admitting: Internal Medicine

## 2020-05-18 NOTE — Telephone Encounter (Signed)
Scheduled appt per 2/7 LOS  - mailed reminder letter with appt date and time

## 2020-05-19 ENCOUNTER — Other Ambulatory Visit: Payer: Self-pay | Admitting: Physician Assistant

## 2020-05-19 DIAGNOSIS — C3491 Malignant neoplasm of unspecified part of right bronchus or lung: Secondary | ICD-10-CM

## 2020-05-21 ENCOUNTER — Ambulatory Visit (INDEPENDENT_AMBULATORY_CARE_PROVIDER_SITE_OTHER): Payer: Medicare HMO | Admitting: Cardiovascular Disease

## 2020-05-21 DIAGNOSIS — Z79899 Other long term (current) drug therapy: Secondary | ICD-10-CM | POA: Diagnosis not present

## 2020-05-21 DIAGNOSIS — Z952 Presence of prosthetic heart valve: Secondary | ICD-10-CM

## 2020-05-21 LAB — POCT INR: INR: 1.7 — AB (ref 2.0–3.0)

## 2020-05-28 ENCOUNTER — Ambulatory Visit (INDEPENDENT_AMBULATORY_CARE_PROVIDER_SITE_OTHER): Payer: Medicare HMO | Admitting: Cardiology

## 2020-05-28 DIAGNOSIS — Z79899 Other long term (current) drug therapy: Secondary | ICD-10-CM | POA: Diagnosis not present

## 2020-05-28 DIAGNOSIS — Z952 Presence of prosthetic heart valve: Secondary | ICD-10-CM

## 2020-05-28 LAB — POCT INR: INR: 2.9 (ref 2.0–3.0)

## 2020-06-03 DIAGNOSIS — D485 Neoplasm of uncertain behavior of skin: Secondary | ICD-10-CM | POA: Diagnosis not present

## 2020-06-03 DIAGNOSIS — D0422 Carcinoma in situ of skin of left ear and external auricular canal: Secondary | ICD-10-CM | POA: Diagnosis not present

## 2020-06-04 ENCOUNTER — Ambulatory Visit (INDEPENDENT_AMBULATORY_CARE_PROVIDER_SITE_OTHER): Payer: Medicare HMO | Admitting: Pharmacist Clinician (PhC)/ Clinical Pharmacy Specialist

## 2020-06-04 DIAGNOSIS — Z79899 Other long term (current) drug therapy: Secondary | ICD-10-CM | POA: Diagnosis not present

## 2020-06-04 DIAGNOSIS — Z952 Presence of prosthetic heart valve: Secondary | ICD-10-CM

## 2020-06-11 DIAGNOSIS — D0422 Carcinoma in situ of skin of left ear and external auricular canal: Secondary | ICD-10-CM | POA: Diagnosis not present

## 2020-06-12 ENCOUNTER — Other Ambulatory Visit: Payer: Self-pay | Admitting: Cardiovascular Disease

## 2020-06-14 ENCOUNTER — Ambulatory Visit (INDEPENDENT_AMBULATORY_CARE_PROVIDER_SITE_OTHER): Payer: Medicare HMO | Admitting: Cardiology

## 2020-06-14 DIAGNOSIS — Z79899 Other long term (current) drug therapy: Secondary | ICD-10-CM | POA: Diagnosis not present

## 2020-06-14 DIAGNOSIS — Z952 Presence of prosthetic heart valve: Secondary | ICD-10-CM | POA: Diagnosis not present

## 2020-06-14 LAB — POCT INR: INR: 3 (ref 2.0–3.0)

## 2020-06-18 ENCOUNTER — Other Ambulatory Visit: Payer: Self-pay | Admitting: Physician Assistant

## 2020-06-18 DIAGNOSIS — C3491 Malignant neoplasm of unspecified part of right bronchus or lung: Secondary | ICD-10-CM

## 2020-06-18 LAB — POCT INR: INR: 3.9 — AB (ref 2.0–3.0)

## 2020-06-21 ENCOUNTER — Ambulatory Visit (INDEPENDENT_AMBULATORY_CARE_PROVIDER_SITE_OTHER): Payer: Medicare HMO | Admitting: Internal Medicine

## 2020-06-21 ENCOUNTER — Telehealth: Payer: Self-pay

## 2020-06-21 DIAGNOSIS — Z952 Presence of prosthetic heart valve: Secondary | ICD-10-CM

## 2020-06-21 DIAGNOSIS — Z79899 Other long term (current) drug therapy: Secondary | ICD-10-CM

## 2020-06-21 NOTE — Telephone Encounter (Signed)
Pts wife called wanting to know if pt should/could repeat his CT scan and/or should have a PET scan as well because of the area of concern. They are questioning if they should wait 17mo for the repeat CT scan because they don't want to wait to long.

## 2020-06-21 NOTE — Telephone Encounter (Signed)
Repeat the scan in 3 months is very reasonable for this small nodule.  No need for a PET scan unless the nodule is enlarging further.  Thank you.

## 2020-06-23 NOTE — Telephone Encounter (Signed)
I spoke to pts wife and advised as indicated. She expressed understanding of this information.

## 2020-06-26 LAB — POCT INR: INR: 3.8 — AB (ref 2.0–3.0)

## 2020-06-28 ENCOUNTER — Ambulatory Visit (INDEPENDENT_AMBULATORY_CARE_PROVIDER_SITE_OTHER): Payer: Medicare HMO | Admitting: Internal Medicine

## 2020-06-28 DIAGNOSIS — Z952 Presence of prosthetic heart valve: Secondary | ICD-10-CM

## 2020-06-28 DIAGNOSIS — Z5111 Encounter for antineoplastic chemotherapy: Secondary | ICD-10-CM

## 2020-06-28 DIAGNOSIS — Z79899 Other long term (current) drug therapy: Secondary | ICD-10-CM

## 2020-06-28 DIAGNOSIS — I4891 Unspecified atrial fibrillation: Secondary | ICD-10-CM

## 2020-06-28 DIAGNOSIS — I9789 Other postprocedural complications and disorders of the circulatory system, not elsewhere classified: Secondary | ICD-10-CM

## 2020-07-09 ENCOUNTER — Ambulatory Visit: Payer: Medicare HMO | Admitting: Cardiovascular Disease

## 2020-07-11 DIAGNOSIS — Z952 Presence of prosthetic heart valve: Secondary | ICD-10-CM | POA: Diagnosis not present

## 2020-07-11 LAB — POCT INR: INR: 3.6 — AB (ref 2.0–3.0)

## 2020-07-13 ENCOUNTER — Ambulatory Visit (INDEPENDENT_AMBULATORY_CARE_PROVIDER_SITE_OTHER): Payer: Medicare HMO | Admitting: Cardiovascular Disease

## 2020-07-13 DIAGNOSIS — Z79899 Other long term (current) drug therapy: Secondary | ICD-10-CM

## 2020-07-13 DIAGNOSIS — Z952 Presence of prosthetic heart valve: Secondary | ICD-10-CM | POA: Diagnosis not present

## 2020-07-14 ENCOUNTER — Encounter: Payer: Self-pay | Admitting: Family Medicine

## 2020-07-19 ENCOUNTER — Telehealth: Payer: Self-pay

## 2020-07-19 ENCOUNTER — Other Ambulatory Visit: Payer: Self-pay

## 2020-07-19 NOTE — Telephone Encounter (Signed)
Pt called about compound medication for his testosterone. Please send prescription sent to the shop in [place]. Please advise.

## 2020-07-19 NOTE — Telephone Encounter (Signed)
Pt asking for refill compounded testosterone medication

## 2020-07-20 NOTE — Telephone Encounter (Signed)
Thank you.  At a minimum we need their name and phone number

## 2020-07-20 NOTE — Telephone Encounter (Signed)
I called patient to get information about the pharmacy. There is none in his chart.

## 2020-07-20 NOTE — Telephone Encounter (Signed)
Called and spoke with someone in pharmacy in [place]. Rx was filled for a 60 day supply under your name and NPI number. So hopefully going forward he will be ok getting his refills. Also called and notified patient.

## 2020-07-20 NOTE — Telephone Encounter (Signed)
Was this filled to his compounding pharmacy in [place] as previously discussed?

## 2020-07-23 ENCOUNTER — Other Ambulatory Visit: Payer: Self-pay | Admitting: Internal Medicine

## 2020-07-23 ENCOUNTER — Ambulatory Visit (INDEPENDENT_AMBULATORY_CARE_PROVIDER_SITE_OTHER): Payer: Medicare HMO | Admitting: Cardiovascular Disease

## 2020-07-23 DIAGNOSIS — C3491 Malignant neoplasm of unspecified part of right bronchus or lung: Secondary | ICD-10-CM

## 2020-07-23 DIAGNOSIS — Z5111 Encounter for antineoplastic chemotherapy: Secondary | ICD-10-CM | POA: Diagnosis not present

## 2020-07-23 DIAGNOSIS — Z79899 Other long term (current) drug therapy: Secondary | ICD-10-CM

## 2020-07-23 DIAGNOSIS — Z952 Presence of prosthetic heart valve: Secondary | ICD-10-CM

## 2020-07-23 LAB — POCT INR: INR: 3.7 — AB (ref 2.0–3.0)

## 2020-07-28 ENCOUNTER — Other Ambulatory Visit: Payer: Self-pay | Admitting: Cardiovascular Disease

## 2020-08-01 ENCOUNTER — Emergency Department (HOSPITAL_COMMUNITY): Payer: Medicare HMO

## 2020-08-01 ENCOUNTER — Emergency Department (HOSPITAL_COMMUNITY)
Admission: EM | Admit: 2020-08-01 | Discharge: 2020-08-01 | Disposition: A | Discharge status: Home / Self Care | Payer: Medicare HMO | Attending: Emergency Medicine | Admitting: Emergency Medicine

## 2020-08-01 ENCOUNTER — Other Ambulatory Visit: Payer: Self-pay

## 2020-08-01 ENCOUNTER — Encounter (HOSPITAL_COMMUNITY): Payer: Self-pay | Admitting: Emergency Medicine

## 2020-08-01 DIAGNOSIS — Z7982 Long term (current) use of aspirin: Secondary | ICD-10-CM | POA: Insufficient documentation

## 2020-08-01 DIAGNOSIS — Z7901 Long term (current) use of anticoagulants: Secondary | ICD-10-CM | POA: Diagnosis not present

## 2020-08-01 DIAGNOSIS — K8020 Calculus of gallbladder without cholecystitis without obstruction: Secondary | ICD-10-CM | POA: Diagnosis not present

## 2020-08-01 DIAGNOSIS — J918 Pleural effusion in other conditions classified elsewhere: Secondary | ICD-10-CM | POA: Insufficient documentation

## 2020-08-01 DIAGNOSIS — R079 Chest pain, unspecified: Secondary | ICD-10-CM | POA: Insufficient documentation

## 2020-08-01 DIAGNOSIS — R109 Unspecified abdominal pain: Secondary | ICD-10-CM | POA: Insufficient documentation

## 2020-08-01 DIAGNOSIS — Z85118 Personal history of other malignant neoplasm of bronchus and lung: Secondary | ICD-10-CM | POA: Insufficient documentation

## 2020-08-01 DIAGNOSIS — I2510 Atherosclerotic heart disease of native coronary artery without angina pectoris: Secondary | ICD-10-CM | POA: Insufficient documentation

## 2020-08-01 DIAGNOSIS — K449 Diaphragmatic hernia without obstruction or gangrene: Secondary | ICD-10-CM | POA: Diagnosis not present

## 2020-08-01 DIAGNOSIS — K5730 Diverticulosis of large intestine without perforation or abscess without bleeding: Secondary | ICD-10-CM | POA: Diagnosis not present

## 2020-08-01 DIAGNOSIS — J439 Emphysema, unspecified: Secondary | ICD-10-CM | POA: Insufficient documentation

## 2020-08-01 DIAGNOSIS — Z87891 Personal history of nicotine dependence: Secondary | ICD-10-CM | POA: Insufficient documentation

## 2020-08-01 DIAGNOSIS — Z951 Presence of aortocoronary bypass graft: Secondary | ICD-10-CM | POA: Diagnosis not present

## 2020-08-01 DIAGNOSIS — I700 Atherosclerosis of aorta: Secondary | ICD-10-CM | POA: Insufficient documentation

## 2020-08-01 DIAGNOSIS — I10 Essential (primary) hypertension: Secondary | ICD-10-CM | POA: Diagnosis not present

## 2020-08-01 DIAGNOSIS — C3490 Malignant neoplasm of unspecified part of unspecified bronchus or lung: Secondary | ICD-10-CM | POA: Diagnosis not present

## 2020-08-01 DIAGNOSIS — Z79899 Other long term (current) drug therapy: Secondary | ICD-10-CM | POA: Diagnosis not present

## 2020-08-01 DIAGNOSIS — I4891 Unspecified atrial fibrillation: Secondary | ICD-10-CM | POA: Diagnosis not present

## 2020-08-01 DIAGNOSIS — J90 Pleural effusion, not elsewhere classified: Secondary | ICD-10-CM

## 2020-08-01 DIAGNOSIS — J449 Chronic obstructive pulmonary disease, unspecified: Secondary | ICD-10-CM | POA: Insufficient documentation

## 2020-08-01 DIAGNOSIS — R0602 Shortness of breath: Secondary | ICD-10-CM | POA: Diagnosis not present

## 2020-08-01 DIAGNOSIS — J9811 Atelectasis: Secondary | ICD-10-CM | POA: Diagnosis not present

## 2020-08-01 LAB — CBC WITH DIFFERENTIAL/PLATELET
Abs Immature Granulocytes: 0.01 10*3/uL (ref 0.00–0.07)
Basophils Absolute: 0 10*3/uL (ref 0.0–0.1)
Basophils Relative: 1 %
Eosinophils Absolute: 0.1 10*3/uL (ref 0.0–0.5)
Eosinophils Relative: 1 %
HCT: 30.7 % — ABNORMAL LOW (ref 39.0–52.0)
Hemoglobin: 10 g/dL — ABNORMAL LOW (ref 13.0–17.0)
Immature Granulocytes: 0 %
Lymphocytes Relative: 18 %
Lymphs Abs: 1 10*3/uL (ref 0.7–4.0)
MCH: 32.5 pg (ref 26.0–34.0)
MCHC: 32.6 g/dL (ref 30.0–36.0)
MCV: 99.7 fL (ref 80.0–100.0)
Monocytes Absolute: 0.5 10*3/uL (ref 0.1–1.0)
Monocytes Relative: 9 %
Neutro Abs: 4.1 10*3/uL (ref 1.7–7.7)
Neutrophils Relative %: 71 %
Platelets: 187 10*3/uL (ref 150–400)
RBC: 3.08 MIL/uL — ABNORMAL LOW (ref 4.22–5.81)
RDW: 11.8 % (ref 11.5–15.5)
WBC: 5.8 10*3/uL (ref 4.0–10.5)
nRBC: 0 % (ref 0.0–0.2)

## 2020-08-01 LAB — COMPREHENSIVE METABOLIC PANEL
ALT: 14 U/L (ref 0–44)
AST: 26 U/L (ref 15–41)
Albumin: 3.7 g/dL (ref 3.5–5.0)
Alkaline Phosphatase: 71 U/L (ref 38–126)
Anion gap: 6 (ref 5–15)
BUN: 16 mg/dL (ref 8–23)
CO2: 27 mmol/L (ref 22–32)
Calcium: 8.7 mg/dL — ABNORMAL LOW (ref 8.9–10.3)
Chloride: 103 mmol/L (ref 98–111)
Creatinine, Ser: 0.94 mg/dL (ref 0.61–1.24)
GFR, Estimated: >60 mL/min (ref >60)
Glucose, Bld: 94 mg/dL (ref 70–99)
Potassium: 4.7 mmol/L (ref 3.5–5.1)
Sodium: 136 mmol/L (ref 135–145)
Total Bilirubin: 0.8 mg/dL (ref 0.3–1.2)
Total Protein: 7.1 g/dL (ref 6.5–8.1)

## 2020-08-01 LAB — URINALYSIS, ROUTINE W REFLEX MICROSCOPIC
Bilirubin Urine: NEGATIVE
Glucose, UA: NEGATIVE mg/dL
Hgb urine dipstick: NEGATIVE
Ketones, ur: NEGATIVE mg/dL
Leukocytes,Ua: NEGATIVE
Nitrite: NEGATIVE
Protein, ur: NEGATIVE mg/dL
Specific Gravity, Urine: 1.011 (ref 1.005–1.030)
pH: 7 (ref 5.0–8.0)

## 2020-08-01 LAB — PROTIME-INR
INR: 3.1 — ABNORMAL HIGH (ref 0.8–1.2)
Prothrombin Time: 32.1 seconds — ABNORMAL HIGH (ref 11.4–15.2)

## 2020-08-01 MED ORDER — HYDROCODONE-ACETAMINOPHEN 5-325 MG PO TABS
1.0000 | ORAL_TABLET | Freq: Once | ORAL | Status: AC
  Administered 2020-08-01: 1 via ORAL
  Filled 2020-08-01: qty 1

## 2020-08-01 MED ORDER — IOHEXOL 350 MG/ML SOLN
100.0000 mL | Freq: Once | INTRAVENOUS | Status: AC | PRN
  Administered 2020-08-01: 100 mL via INTRAVENOUS

## 2020-08-01 MED ORDER — FENTANYL CITRATE (PF) 100 MCG/2ML IJ SOLN
50.0000 ug | Freq: Once | INTRAMUSCULAR | Status: AC
  Administered 2020-08-01: 50 ug via INTRAVENOUS
  Filled 2020-08-01: qty 2

## 2020-08-01 MED ORDER — TRAMADOL HCL 50 MG PO TABS: 50.0000 mg | ORAL_TABLET | Freq: Three times a day (TID) | ORAL | 0 refills | Status: AC

## 2020-08-01 NOTE — Discharge Instructions (Addendum)
Please use the tramadol for pain.  Please make sure to follow-up with Dr. Earlie Server.  Return to the ER for any new or worsening symptoms.

## 2020-08-01 NOTE — ED Provider Notes (Addendum)
[facility] DEPT Provider Note   CSN: [id number] Arrival date & time: 08/01/20  1108     History Chief Complaint  Patient presents with  . Shortness of Breath    Marcus [name] is a 75 y.o. [name].  HPI 75 year old [name] with a history of atrial fibrillation on warfarin, hypertension, adenocarcinoma of the level of stage III followed by Dr. Earlie Server presents to the ER with 1 week of right posterior pain with inspiration.  Marcus reports pain Tylenol with little relief.  Because of the pain on inspiration Marcus describes the shortness of breath.  No cough, no hemoptysis.  Marcus has been compliant with his warfarin.  Denies any leg swelling.  No chest pain.  No fevers or chills.    Past Medical History:  Diagnosis Date  . Atrial fibrillation ([place])   . Dyspnea    with exertion   . Dysrhythmia   . Hypertension   . Obstructive sleep apnea 10/25/2007   cpap  . S/P AVR    2D ECHO, 04/25/2011 - EF >55%, Right ventricle-mild-moderately dilated  . Swelling of limb    LEA VENOUS, 08/22/2009 - no evidence of deep vein or superficial thrombosis; partially rupturing Baker's Cyst    Patient Active Problem List   Diagnosis Date Noted  . Adenocarcinoma of lung, stage 3 ([place]) 01/28/2020  . Goals of care, counseling/discussion 01/28/2020  . Adenocarcinoma of right lung, stage 3 ([place]) 01/01/2020  . Encounter for antineoplastic chemotherapy 01/01/2020  . Screening for malignant neoplasm of prostate 12/31/2019  . Hypocalcemia 12/31/2019  . Hypomagnesemia 12/31/2019  . [name] hypogonadism 12/31/2019  . Presence of prosthetic heart valve 12/31/2019  . Thrombocytopenia ([place]) 12/31/2019  . Thyromegaly 12/31/2019  . Vitamin D deficiency 12/31/2019  . Abdominal hernia 06/16/2019  . COPD (chronic obstructive pulmonary disease) ([place]) 06/16/2019  . Postoperative atrial fibrillation ([place]) 12/22/2012  . Sleep apnea 11/01/2012  . Bradycardia, severe sinus 09/09/2012  . CAD (coronary  artery disease) 09/09/2012  . S/P thoracic aortic aneurysm repair 09/09/2012  . Hx of CABG 09/09/2012  . Hypercholesterolemia 09/09/2012  . Hypertension 09/09/2012  . Other long term (current) drug therapy 06/28/2012  . H/O aortic valve replacement 06/28/2012    Past Surgical History:  Procedure Laterality Date  . CARDIAC CATHETERIZATION Bilateral 05/10/2007   Significant 1-vessel disease, severely dilated aortic root with moderate severe aortic insufficiency  . CARDIAC SURGERY    . CARDIOVERSION  08/02/2007   150 Joule biphasic shock with restoration of sinus rhythm. Heart rate 60.  . cataract surgery     . COLONOSCOPY WITH PROPOFOL N/A 12/15/2016   Procedure: COLONOSCOPY WITH PROPOFOL;  Surgeon: Carol Ada, MD;  Location: [facility];  Service: Endoscopy;  Laterality: N/A;  . EYE SURGERY    . INTERCOSTAL NERVE BLOCK Right 12/08/2019   Procedure: INTERCOSTAL NERVE BLOCK;  Surgeon: [name], MD;  Location: [facility];  Service: Thoracic;  Laterality: Right;  . NM MYOVIEW LTD  04/08/2007   No evidence of inducible myocardial ischemia  . NODE DISSECTION Right 12/08/2019   Procedure: NODE DISSECTION;  Surgeon: [name], MD;  Location: [facility];  Service: Thoracic;  Laterality: Right;  . THYROIDECTOMY, PARTIAL    . torn meniscus in right knee surgery          Family History  Problem Relation Age of Onset  . Cancer Father     Social History   Tobacco Use  . Smoking status: Former Smoker  Types: Cigarettes    Quit date: 04/11/1987    Years since quitting: 33.3  . Smokeless tobacco: Never Used  Vaping Use  . Vaping Use: Never used  Substance Use Topics  . Alcohol use: No  . Drug use: No    Home Medications Prior to Admission medications   Medication Sig Start Date End Date Taking? Authorizing Provider  acetaminophen (TYLENOL) 325 MG tablet Take 2 tablets (650 mg total) by mouth every 6 (six) hours as needed for mild pain. 12/18/19  Yes Barrett, Erin R, PA-C   ALPRAZolam (XANAX) 0.25 MG tablet Take 1 tablet (0.25 mg total) by mouth every 8 (eight) hours as needed for up to 20 doses for anxiety. 03/18/20  Yes [name], MD  aspirin EC 81 MG tablet Take 81 mg by mouth daily.   Yes [provider]  atorvastatin (LIPITOR) 80 MG tablet TAKE 1 TABLET BY MOUTH EVERY DAY Patient taking differently: Take 80 mg by mouth daily. 07/28/20  Yes Troy Sine, MD  carboxymethylcellulose (REFRESH PLUS) 0.5 % SOLN Place 1 drop into both eyes daily as needed (dry eyes).    Yes [provider]  cetirizine (ZYRTEC) 10 MG tablet Take 10 mg by mouth daily.   Yes [provider]  dexamethasone (DECADRON) 4 MG tablet Please take 1 tablet twice daily the day before, the day of, and the day after chemotherapy 01/28/20  Yes [name], [name], PA-C  fexofenadine (ALLEGRA) 180 MG tablet Take 180 mg by mouth every evening.   Yes [provider]  folic acid (FOLVITE) 1 MG tablet TAKE 1 TABLET BY MOUTH EVERY DAY Patient taking differently: Take 1 mg by mouth daily. 06/21/20  Yes Curt Bears, MD  Multiple Vitamins-Minerals (EYE SUPPORT PO) Take 1 tablet by mouth daily.   Yes [provider]  NONFORMULARY OR COMPOUNDED ITEM Apply 2 sprays topically as directed. Testosterone Topical  Preparation 100 mg (Compounded by [place])  Apply 2 clicks (50 mg) daily   Yes [provider]  prochlorperazine (COMPAZINE) 10 MG tablet Take 1 tablet (10 mg total) by mouth every 6 (six) hours as needed. Patient taking differently: Take 10 mg by mouth every 6 (six) hours as needed for vomiting or nausea. 01/28/20  Yes [name], [name], PA-C  ramipril (ALTACE) 10 MG capsule TAKE 1 CAPSULE BY MOUTH EVERY DAY Patient taking differently: Take 10 mg by mouth daily. 08/12/19  Yes [name], Mihai, MD  warfarin (COUMADIN) 10 MG tablet TAKE 1 TO 1 AND 1/2 TABLETS DAILY AS DIRECTED BY COUMADIN  CLINIC Patient taking differently: Take 10-15 mg by mouth daily at 4 PM. 10 mg everyday except Tuesday & Thursday patient takes 15 mg 02/27/20  Yes [name], Mihai, MD  enoxaparin (LOVENOX) 120 MG/0.8ML injection Inject 1 syringe (120 mg) once daily as directed by coumadin clinic 03/15/20   [name], Mihai, MD  furosemide (LASIX) 20 MG tablet TAKE 1 TABLET AS NEEDED FOR SWELLING Patient taking differently: Take 20 mg by mouth daily. Take 1 tablet as needed for swelling 06/14/20   [name], Mihai, MD    Allergies    Amlodipine, Sulfa antibiotics, Tetanus toxoids, Crestor [rosuvastatin], and Penicillins  Review of Systems   Review of Systems  Constitutional: Negative for chills and fever.  HENT: Negative for ear pain and sore throat.   Eyes: Negative for pain and visual disturbance.  Respiratory: Positive for shortness of breath. Negative for cough.   Cardiovascular: Negative for chest pain and  palpitations.  Gastrointestinal: Negative for abdominal pain and vomiting.  Genitourinary: Negative for dysuria and hematuria.  Musculoskeletal: Negative for arthralgias and back pain.  Skin: Negative for color change and rash.  Neurological: Negative for seizures and syncope.  All other systems reviewed and are negative.   Physical Exam Updated Vital Signs BP 137/77   Pulse (!) 59   Temp 98 F (36.7 C) (Oral)   Resp (!) 24   Ht 5\' 11"  (1.803 m)   Wt 80.3 kg   SpO2 98%   BMI 24.69 kg/m   Physical Exam Vitals and nursing note reviewed.  Constitutional:      Appearance: Marcus is well-developed.  HENT:     Head: Normocephalic and atraumatic.  Eyes:     Conjunctiva/sclera: Conjunctivae normal.  Cardiovascular:     Rate and Rhythm: Normal rate and regular rhythm.     Heart sounds: No murmur heard.   Pulmonary:     Effort: Pulmonary effort is normal. No respiratory distress.     Breath sounds: Normal breath sounds. No decreased breath sounds, wheezing, rhonchi or rales.  Abdominal:      Palpations: Abdomen is soft.     Tenderness: There is no abdominal tenderness.     Comments: No flank tenderness bilaterally  Musculoskeletal:     Cervical back: Neck supple.     Right lower leg: No tenderness. No edema.     Left lower leg: No tenderness. No edema.  Skin:    General: Skin is warm and dry.  Neurological:     General: No focal deficit present.     Mental Status: Marcus is alert.  Psychiatric:        Mood and Affect: Mood normal.        Behavior: Behavior normal.     ED Results / Procedures / Treatments   Labs (all labs ordered are listed, but only abnormal results are displayed) Labs Reviewed  CBC WITH DIFFERENTIAL/PLATELET - Abnormal; Notable for the following components:      Result Value   RBC 3.08 (*)    Hemoglobin 10.0 (*)    HCT 30.7 (*)    All other components within normal limits  COMPREHENSIVE METABOLIC PANEL - Abnormal; Notable for the following components:   Calcium 8.7 (*)    All other components within normal limits  PROTIME-INR - Abnormal; Notable for the following components:   Prothrombin Time 32.1 (*)    INR 3.1 (*)    All other components within normal limits  URINALYSIS, ROUTINE W REFLEX MICROSCOPIC    EKG None  Radiology CT Angio Chest PE W and/or Wo Contrast  Result Date: 08/01/2020 CLINICAL DATA:  Evaluate for acute pulmonary embolus. History of lung cancer. Now with shortness of breath EXAM: CT ANGIOGRAPHY CHEST CT ABDOMEN AND PELVIS WITH CONTRAST TECHNIQUE: Multidetector CT imaging of the chest was performed using the standard protocol during bolus administration of intravenous contrast. Multiplanar CT image reconstructions and MIPs were obtained to evaluate the vascular anatomy. Multidetector CT imaging of the abdomen and pelvis was performed using the standard protocol during bolus administration of intravenous contrast. CONTRAST:  161mL OMNIPAQUE IOHEXOL 350 MG/ML SOLN COMPARISON:  05/14/2020 FINDINGS: CTA CHEST FINDINGS  Cardiovascular: Satisfactory opacification of the pulmonary arteries to the segmental level. No evidence of pulmonary embolism. Normal heart size. No pericardial effusion. Aortic atherosclerosis. Previous CABG procedure. Mediastinum/Nodes: No enlarged mediastinal, hilar, or axillary lymph nodes. Thyroid gland, trachea, and esophagus demonstrate no significant findings. Lungs/Pleura: There is a  moderate loculated right pleural effusion, new from previous exam. Additionally, there is mild pleural soft tissue thickening overlying the apical portions of the right lung which measures 4 mm in thickness, also new when compared with the previous exam. No focal parenchymal nodule or mass identified. Mild changes of emphysema. Musculoskeletal: No chest wall abnormality. No acute or significant osseous findings. Review of the MIP images confirms the above findings. CT ABDOMEN and PELVIS FINDINGS Hepatobiliary: Normal appearance of the liver. No suspicious liver abnormality. Small gallstone noted within the dependent portion of the gallbladder. No gallbladder wall thickening or pericholecystic fluid. Pancreas: Unremarkable. No pancreatic ductal dilatation or surrounding inflammatory changes. Spleen: Normal in size without focal abnormality. Adrenals/Urinary Tract: The adrenal glands appear normal. Bilateral kidney cysts. The largest is in the posterolateral cortex of the right mid kidney measuring 2.7 cm. Urinary bladder is unremarkable. Stomach/Bowel: Small hiatal hernia. The appendix is visualized and appears normal. Colonic scratch set sigmoid diverticulosis noted without signs of acute inflammation. No bowel wall thickening, inflammation or distension. Vascular/Lymphatic: Aortic atherosclerosis. No enlarged abdominal or pelvic lymph nodes. Reproductive: Prostate is unremarkable. Other: No free fluid or fluid collections. Small fat containing umbilical hernia is noted. Musculoskeletal: No acute or significant osseous  findings. Review of the MIP images confirms the above findings. IMPRESSION: 1. No evidence for acute pulmonary embolus. 2. New moderate loculated right pleural effusion with mild pleural soft tissue thickening overlying the apical portions of the right lung. Etiology is indeterminate. If there is a clinical concern for pleural spread of disease consider further evaluation with diagnostic thoracentesis. 3. No acute findings within the abdomen or pelvis. 4. Emphysema and aortic atherosclerosis. Aortic Atherosclerosis (ICD10-I70.0) and Emphysema (ICD10-J43.9). Electronically Signed   By: Kerby Moors M.D.   On: 08/01/2020 13:30   CT ABDOMEN PELVIS W CONTRAST  Result Date: 08/01/2020 CLINICAL DATA:  Evaluate for acute pulmonary embolus. History of lung cancer. Now with shortness of breath EXAM: CT ANGIOGRAPHY CHEST CT ABDOMEN AND PELVIS WITH CONTRAST TECHNIQUE: Multidetector CT imaging of the chest was performed using the standard protocol during bolus administration of intravenous contrast. Multiplanar CT image reconstructions and MIPs were obtained to evaluate the vascular anatomy. Multidetector CT imaging of the abdomen and pelvis was performed using the standard protocol during bolus administration of intravenous contrast. CONTRAST:  122mL OMNIPAQUE IOHEXOL 350 MG/ML SOLN COMPARISON:  05/14/2020 FINDINGS: CTA CHEST FINDINGS Cardiovascular: Satisfactory opacification of the pulmonary arteries to the segmental level. No evidence of pulmonary embolism. Normal heart size. No pericardial effusion. Aortic atherosclerosis. Previous CABG procedure. Mediastinum/Nodes: No enlarged mediastinal, hilar, or axillary lymph nodes. Thyroid gland, trachea, and esophagus demonstrate no significant findings. Lungs/Pleura: There is a moderate loculated right pleural effusion, new from previous exam. Additionally, there is mild pleural soft tissue thickening overlying the apical portions of the right lung which measures 4 mm in  thickness, also new when compared with the previous exam. No focal parenchymal nodule or mass identified. Mild changes of emphysema. Musculoskeletal: No chest wall abnormality. No acute or significant osseous findings. Review of the MIP images confirms the above findings. CT ABDOMEN and PELVIS FINDINGS Hepatobiliary: Normal appearance of the liver. No suspicious liver abnormality. Small gallstone noted within the dependent portion of the gallbladder. No gallbladder wall thickening or pericholecystic fluid. Pancreas: Unremarkable. No pancreatic ductal dilatation or surrounding inflammatory changes. Spleen: Normal in size without focal abnormality. Adrenals/Urinary Tract: The adrenal glands appear normal. Bilateral kidney cysts. The largest is in the posterolateral cortex of the right mid  kidney measuring 2.7 cm. Urinary bladder is unremarkable. Stomach/Bowel: Small hiatal hernia. The appendix is visualized and appears normal. Colonic scratch set sigmoid diverticulosis noted without signs of acute inflammation. No bowel wall thickening, inflammation or distension. Vascular/Lymphatic: Aortic atherosclerosis. No enlarged abdominal or pelvic lymph nodes. Reproductive: Prostate is unremarkable. Other: No free fluid or fluid collections. Small fat containing umbilical hernia is noted. Musculoskeletal: No acute or significant osseous findings. Review of the MIP images confirms the above findings. IMPRESSION: 1. No evidence for acute pulmonary embolus. 2. New moderate loculated right pleural effusion with mild pleural soft tissue thickening overlying the apical portions of the right lung. Etiology is indeterminate. If there is a clinical concern for pleural spread of disease consider further evaluation with diagnostic thoracentesis. 3. No acute findings within the abdomen or pelvis. 4. Emphysema and aortic atherosclerosis. Aortic Atherosclerosis (ICD10-I70.0) and Emphysema (ICD10-J43.9). Electronically Signed   By: Kerby Moors M.D.   On: 08/01/2020 13:30   DG Chest Portable 1 View  Result Date: 08/01/2020 CLINICAL DATA:  Worsening shortness of breath over the past week. History of right lung cancer status post right upper lobe lobectomy. EXAM: PORTABLE CHEST 1 VIEW COMPARISON:  Chest CT 05/14/2020 and chest x-ray 01/23/2020 FINDINGS: The heart is normal in size and stable. The mediastinal and hilar contours are within normal limits and unchanged. Stable surgical changes from aortic valve replacement surgery. New right, possibly loculated, pleural effusion and overlying atelectasis. This was not present on a recent chest CT from 05/14/2020. Chest CT may be helpful for further evaluation. The left lung is clear. No edema or infiltrates. The bony thorax is intact. IMPRESSION: New right pleural effusion and overlying atelectasis. Electronically Signed   By: Marijo Sanes M.D.   On: 08/01/2020 12:11    Procedures Procedures   Medications Ordered in ED Medications  HYDROcodone-acetaminophen (NORCO/VICODIN) 5-325 MG per tablet 1 tablet (has no administration in time range)  fentaNYL (SUBLIMAZE) injection 50 mcg (50 mcg Intravenous Given 08/01/20 1201)  iohexol (OMNIPAQUE) 350 MG/ML injection 100 mL (100 mLs Intravenous Contrast Given 08/01/20 1250)    ED Course  I have reviewed the triage vital signs and the nursing notes.  Pertinent labs & imaging results that were available during my care of the patient were reviewed by me and considered in my medical decision making (see chart for details).    MDM Rules/Calculators/A&P                         75 year old [name] with complaints of pain on inspiration to the right side.  On arrival, Marcus is very well-appearing, no acute distress, speaking full sentences without increased work of breathing.  Lung sounds are clear.  Vitals reassuring, afebrile, not tachycardic, tachypneic or hypoxic.  DDx includes PE, recurrence of cancer, dissection, kidney stones, pneumonia, empyema,  ACS, msk strain   Labs and imaging ordered, reviewed and interpreted by me.  CBC without leukocytosis, stable hemoglobin.  CMP largely unremarkable.  PT/INR therapeutic.  Chest x-ray with a new right pleural effusion with overlying atelectasis.  CT angio of the chest was ordered given concern for PE, which showed a new loculated pleural effusion on the right lobe with mild soft tissue thickening.  There is concern for possible recurrence.  CT of the abdomen and pelvis unremarkable.  Patient is overall well-appearing, without evidence of respiratory distress.  No signs of sepsis.  Patient and wife at bedside were informed of the findings, stressed  follow-up with Dr. Earlie Server.  I spoke with Dr. Lorenso Courier with oncology who will pass along the message to schedule the patient for follow-up.  As per discussion with Dr. Lorenso Courier, will give tramadol for pain. PDMP reviewed, appropriate  Was ambulated without evidence of hypoxia.  We discussed return precautions.  Patient and wife voiced understanding and are agreeable.  Stable for discharge.  This was a shared visit with my supervising physician Dr.Messick who independently saw and evaluated the patient & provided guidance in evaluation/management/disposition ,in agreement with care  Final Clinical Impression(s) / ED Diagnoses Final diagnoses:  Pleural effusion on right    Rx / DC Orders ED Discharge Orders    None           Lyndel Safe 08/01/20 1402    Valarie Merino, MD 08/01/20 1456

## 2020-08-01 NOTE — ED Triage Notes (Signed)
Pt arrived via POV from home. Pt has hx of lung cancer and has had his right upper lobe replaced. Pt has also had a heart valve replaced. Pt is complaining of shortness of breath for the past week that has been steadily getting worse.

## 2020-08-02 ENCOUNTER — Telehealth: Payer: Self-pay | Admitting: Internal Medicine

## 2020-08-02 ENCOUNTER — Inpatient Hospital Stay: Payer: Medicare HMO | Attending: Internal Medicine | Admitting: Physician Assistant

## 2020-08-02 ENCOUNTER — Telehealth: Payer: Self-pay

## 2020-08-02 ENCOUNTER — Inpatient Hospital Stay: Payer: Medicare HMO

## 2020-08-02 VITALS — BP 136/71 | HR 67 | Temp 98.7°F | Resp 17 | Wt 182.3 lb

## 2020-08-02 DIAGNOSIS — C3411 Malignant neoplasm of upper lobe, right bronchus or lung: Secondary | ICD-10-CM | POA: Diagnosis not present

## 2020-08-02 DIAGNOSIS — C3491 Malignant neoplasm of unspecified part of right bronchus or lung: Secondary | ICD-10-CM

## 2020-08-02 DIAGNOSIS — J90 Pleural effusion, not elsewhere classified: Secondary | ICD-10-CM | POA: Diagnosis not present

## 2020-08-02 NOTE — Progress Notes (Signed)
[facility] OFFICE PROGRESS NOTE  [name], MD [address] [place] [address]  DIAGNOSIS: Stage IIIb (T3, N2, M0) non-small cell lung cancer, adenocarcinoma presented with right upper lobe lung mass in addition to right hilar and mediastinal lymphadenopathy. Diagnosed inAugust 30, 2021.  Molecular Studies: Negative for actionable mutations  PDL1: 1-49%  PRIOR THERAPY:  1) 1) Right upper lobectomy with lymph node sampling under the care of Dr. Kipp Brood on December 08, 2019. 2) Adjuvant systemic chemotherapy with Cisplatin75 mg/m2and Alimta 500 mg/m2 IV every 3 weeks. Last dose on 04/13/20.  Status post 4 cycles.  CURRENT THERAPY: Observation  INTERVAL HISTORY: Marcus [name] 75 y.o. male returns to clinic today for a follow-up visit accompanied by his wife.  The patient has a history of stage III non-small cell lung cancer status post resection and adjuvant chemotherapy.  He is currently on observation.  He was last seen in clinic on 05/17/2020 and had a restaging CT scan which did not show any evidence of disease progression.  He was supposed have a restaging CT scan on 08/16/2020.  He has noticed increased shortness of breath and a right posterior/lateral rib pain with inspiration for 1 week.  He had a repeat chest x-ray which showed a new right pleural effusion with overlying atelectasis.  The patient presented to the emergency room on 08/01/2020 for this complaint which is CT angiogram was performed which showed new loculated pleural effusion on the right lobe with mild soft tissue thickening which could be concerning for possible disease recurrence. Overall, the patient was well-appearing without evidence of respiratory distress and he was discharged with close follow-up with oncology.  The patient's wife called the office to arrange for a follow-up and he is being seen today to further discuss these findings and recommendations for further  evaluation.  Overall, the patient feels similar. He is endorsing some pleuritic chest discomfort with inspiration. He takes 1 tylenol which eases off the pain. He reports a new cough but states it is secondary to allergies/pollen. Denies any recent fever, chills, night sweats, or unexplained weight loss.  He reports decreased appetite for 2 days. He denies any hemoptysis.  He denies any nausea, vomiting, diarrhea, or constipation.  He denies any headache or visual changes.  The patient is here today for evaluation to review his repeat CT angiogram of the chest.   MEDICAL HISTORY: Past Medical History:  Diagnosis Date  . Atrial fibrillation ([place])   . Dyspnea    with exertion   . Dysrhythmia   . Hypertension   . Obstructive sleep apnea 10/25/2007   cpap  . S/P AVR    2D ECHO, 04/25/2011 - EF >55%, Right ventricle-mild-moderately dilated  . Swelling of limb    LEA VENOUS, 08/22/2009 - no evidence of deep vein or superficial thrombosis; partially rupturing Baker's Cyst    ALLERGIES:  is allergic to amlodipine, sulfa antibiotics, tetanus toxoids, crestor [rosuvastatin], and penicillins.  MEDICATIONS:  Current Outpatient Medications  Medication Sig Dispense Refill  . acetaminophen (TYLENOL) 325 MG tablet Take 2 tablets (650 mg total) by mouth every 6 (six) hours as needed for mild pain.    Marland Kitchen ALPRAZolam (XANAX) 0.25 MG tablet Take 1 tablet (0.25 mg total) by mouth every 8 (eight) hours as needed for up to 20 doses for anxiety. 20 tablet 0  . aspirin EC 81 MG tablet Take 81 mg by mouth daily.    Marland Kitchen atorvastatin (LIPITOR) 80 MG  tablet TAKE 1 TABLET BY MOUTH EVERY DAY (Patient taking differently: Take 80 mg by mouth daily.) 90 tablet 2  . carboxymethylcellulose (REFRESH PLUS) 0.5 % SOLN Place 1 drop into both eyes daily as needed (dry eyes).     . cetirizine (ZYRTEC) 10 MG tablet Take 10 mg by mouth daily.    . fexofenadine (ALLEGRA) 180 MG tablet Take 180 mg by mouth every evening.    . furosemide  (LASIX) 20 MG tablet TAKE 1 TABLET AS NEEDED FOR SWELLING (Patient taking differently: Take 20 mg by mouth daily. Take 1 tablet as needed for swelling) 30 tablet 3  . Multiple Vitamins-Minerals (EYE SUPPORT PO) Take 1 tablet by mouth daily.    . NONFORMULARY OR COMPOUNDED ITEM Apply 2 sprays topically as directed. Testosterone Topical  Preparation 100 mg (Compounded by [place])  Apply 2 clicks (50 mg) daily    . ramipril (ALTACE) 10 MG capsule TAKE 1 CAPSULE BY MOUTH EVERY DAY (Patient taking differently: Take 10 mg by mouth daily.) 90 capsule 3  . traMADol (ULTRAM) 50 MG tablet Take 1 tablet (50 mg total) by mouth every 8 (eight) hours for 3 days. 9 tablet 0  . warfarin (COUMADIN) 10 MG tablet TAKE 1 TO 1 AND 1/2 TABLETS DAILY AS DIRECTED BY COUMADIN CLINIC (Patient taking differently: Take 10-15 mg by mouth daily at 4 PM. 10 mg everyday except Tuesday & Thursday patient takes 15 mg) 135 tablet 1  . dexamethasone (DECADRON) 4 MG tablet Please take 1 tablet twice daily the day before, the day of, and the day after chemotherapy (Patient not taking: Reported on 08/02/2020) 40 tablet 2  . enoxaparin (LOVENOX) 120 MG/0.8ML injection Inject 1 syringe (120 mg) once daily as directed by coumadin clinic (Patient not taking: Reported on 10/23/9676) 8 mL 0  . folic acid (FOLVITE) 1 MG tablet TAKE 1 TABLET BY MOUTH EVERY DAY (Patient not taking: Reported on 08/02/2020) 30 tablet 0  . prochlorperazine (COMPAZINE) 10 MG tablet Take 1 tablet (10 mg total) by mouth every 6 (six) hours as needed. (Patient not taking: Reported on 08/02/2020) 30 tablet 2   No current facility-administered medications for this visit.    SURGICAL HISTORY:  Past Surgical History:  Procedure Laterality Date  . CARDIAC CATHETERIZATION Bilateral 05/10/2007   Significant 1-vessel disease, severely dilated aortic root with moderate severe aortic insufficiency  . CARDIAC SURGERY    . CARDIOVERSION  08/02/2007   150  Joule biphasic shock with restoration of sinus rhythm. Heart rate 60.  . cataract surgery     . COLONOSCOPY WITH PROPOFOL N/A 12/15/2016   Procedure: COLONOSCOPY WITH PROPOFOL;  Surgeon: Carol Ada, MD;  Location: [facility];  Service: Endoscopy;  Laterality: N/A;  . EYE SURGERY    . INTERCOSTAL NERVE BLOCK Right 12/08/2019   Procedure: INTERCOSTAL NERVE BLOCK;  Surgeon: [name], MD;  Location: [facility];  Service: Thoracic;  Laterality: Right;  . NM MYOVIEW LTD  04/08/2007   No evidence of inducible myocardial ischemia  . NODE DISSECTION Right 12/08/2019   Procedure: NODE DISSECTION;  Surgeon: [name], MD;  Location: [facility];  Service: Thoracic;  Laterality: Right;  . THYROIDECTOMY, PARTIAL    . torn meniscus in right knee surgery       REVIEW OF SYSTEMS:   Review of Systems  Constitutional: Negative for appetite change, chills, fatigue, fever and unexpected weight change.  HENT: Negative for mouth sores, nosebleeds, sore throat and trouble swallowing.  Eyes: Negative for eye problems and icterus.  Respiratory: Positive for increased shortness of breath. Negative for cough, hemoptysis, shortness of breath and wheezing.   Cardiovascular: Negative for positive for pleuritic right chest discomfort. Negative for leg swelling.  Gastrointestinal: Negative for abdominal pain, constipation, diarrhea, nausea and vomiting.  Genitourinary: Negative for bladder incontinence, difficulty urinating, dysuria, frequency and hematuria.   Musculoskeletal: Negative for back pain, gait problem, neck pain and neck stiffness.  Skin: Negative for itching and rash.  Neurological: Negative for dizziness, extremity weakness, gait problem, headaches, light-headedness and seizures.  Hematological: Negative for adenopathy. Does not bruise/bleed easily.  Psychiatric/Behavioral: Negative for confusion, depression and sleep disturbance. The patient is not nervous/anxious.     PHYSICAL  EXAMINATION:  Blood pressure 136/71, pulse 67, temperature 98.7 F (37.1 C), temperature source Tympanic, resp. rate 17, weight 182 lb 4.8 oz (82.7 kg), SpO2 94 %.  ECOG PERFORMANCE STATUS: 1 - Symptomatic but completely ambulatory  Physical Exam  Constitutional: Oriented to person, place, and time and well-developed, well-nourished, and in no distress.  HENT:  Head: Normocephalic and atraumatic.  Mouth/Throat: Oropharynx is clear and moist. No oropharyngeal exudate.  Eyes: Conjunctivae are normal. Right eye exhibits no discharge. Left eye exhibits no discharge. No scleral icterus.  Neck: Normal range of motion. Neck supple.  Cardiovascular: Normal rate, regular rhythm, normal heart sounds and intact distal pulses.   Pulmonary/Chest: Effort normal and breath sounds normal. No respiratory distress. No wheezes. No rales.  Abdominal: Soft. Bowel sounds are normal. Exhibits no distension and no mass. There is no tenderness.  Musculoskeletal: Normal range of motion. Exhibits no edema.  Lymphadenopathy:    No cervical adenopathy.  Neurological: Alert and oriented to person, place, and time. Exhibits normal muscle tone. Gait normal. Coordination normal.  Skin: Skin is warm and dry. No rash noted. Not diaphoretic. No erythema. No pallor.  Psychiatric: Mood, memory and judgment normal.  Vitals reviewed.  LABORATORY DATA: Lab Results  Component Value Date   WBC 5.8 08/01/2020   HGB 10.0 (L) 08/01/2020   HCT 30.7 (L) 08/01/2020   MCV 99.7 08/01/2020   PLT 187 08/01/2020      Chemistry      Component Value Date/Time   NA 136 08/01/2020 1134   NA 139 10/28/2019 1057   K 4.7 08/01/2020 1134   CL 103 08/01/2020 1134   CO2 27 08/01/2020 1134   BUN 16 08/01/2020 1134   BUN 10 10/28/2019 1057   CREATININE 0.94 08/01/2020 1134   CREATININE 0.94 05/14/2020 1336      Component Value Date/Time   CALCIUM 8.7 (L) 08/01/2020 1134   ALKPHOS 71 08/01/2020 1134   AST 26 08/01/2020 1134   AST  28 05/14/2020 1336   ALT 14 08/01/2020 1134   ALT 15 05/14/2020 1336   BILITOT 0.8 08/01/2020 1134   BILITOT 0.6 05/14/2020 1336       RADIOGRAPHIC STUDIES:  CT Angio Chest PE W and/or Wo Contrast  Result Date: 08/01/2020 CLINICAL DATA:  Evaluate for acute pulmonary embolus. History of lung cancer. Now with shortness of breath EXAM: CT ANGIOGRAPHY CHEST CT ABDOMEN AND PELVIS WITH CONTRAST TECHNIQUE: Multidetector CT imaging of the chest was performed using the standard protocol during bolus administration of intravenous contrast. Multiplanar CT image reconstructions and MIPs were obtained to evaluate the vascular anatomy. Multidetector CT imaging of the abdomen and pelvis was performed using the standard protocol during bolus administration of intravenous contrast. CONTRAST:  12m OMNIPAQUE IOHEXOL 350 MG/ML  SOLN COMPARISON:  05/14/2020 FINDINGS: CTA CHEST FINDINGS Cardiovascular: Satisfactory opacification of the pulmonary arteries to the segmental level. No evidence of pulmonary embolism. Normal heart size. No pericardial effusion. Aortic atherosclerosis. Previous CABG procedure. Mediastinum/Nodes: No enlarged mediastinal, hilar, or axillary lymph nodes. Thyroid gland, trachea, and esophagus demonstrate no significant findings. Lungs/Pleura: There is a moderate loculated right pleural effusion, new from previous exam. Additionally, there is mild pleural soft tissue thickening overlying the apical portions of the right lung which measures 4 mm in thickness, also new when compared with the previous exam. No focal parenchymal nodule or mass identified. Mild changes of emphysema. Musculoskeletal: No chest wall abnormality. No acute or significant osseous findings. Review of the MIP images confirms the above findings. CT ABDOMEN and PELVIS FINDINGS Hepatobiliary: Normal appearance of the liver. No suspicious liver abnormality. Small gallstone noted within the dependent portion of the gallbladder. No  gallbladder wall thickening or pericholecystic fluid. Pancreas: Unremarkable. No pancreatic ductal dilatation or surrounding inflammatory changes. Spleen: Normal in size without focal abnormality. Adrenals/Urinary Tract: The adrenal glands appear normal. Bilateral kidney cysts. The largest is in the posterolateral cortex of the right mid kidney measuring 2.7 cm. Urinary bladder is unremarkable. Stomach/Bowel: Small hiatal hernia. The appendix is visualized and appears normal. Colonic scratch set sigmoid diverticulosis noted without signs of acute inflammation. No bowel wall thickening, inflammation or distension. Vascular/Lymphatic: Aortic atherosclerosis. No enlarged abdominal or pelvic lymph nodes. Reproductive: Prostate is unremarkable. Other: No free fluid or fluid collections. Small fat containing umbilical hernia is noted. Musculoskeletal: No acute or significant osseous findings. Review of the MIP images confirms the above findings. IMPRESSION: 1. No evidence for acute pulmonary embolus. 2. New moderate loculated right pleural effusion with mild pleural soft tissue thickening overlying the apical portions of the right lung. Etiology is indeterminate. If there is a clinical concern for pleural spread of disease consider further evaluation with diagnostic thoracentesis. 3. No acute findings within the abdomen or pelvis. 4. Emphysema and aortic atherosclerosis. Aortic Atherosclerosis (ICD10-I70.0) and Emphysema (ICD10-J43.9). Electronically Signed   By: Kerby Moors M.D.   On: 08/01/2020 13:30   CT ABDOMEN PELVIS W CONTRAST  Result Date: 08/01/2020 CLINICAL DATA:  Evaluate for acute pulmonary embolus. History of lung cancer. Now with shortness of breath EXAM: CT ANGIOGRAPHY CHEST CT ABDOMEN AND PELVIS WITH CONTRAST TECHNIQUE: Multidetector CT imaging of the chest was performed using the standard protocol during bolus administration of intravenous contrast. Multiplanar CT image reconstructions and MIPs were  obtained to evaluate the vascular anatomy. Multidetector CT imaging of the abdomen and pelvis was performed using the standard protocol during bolus administration of intravenous contrast. CONTRAST:  160m OMNIPAQUE IOHEXOL 350 MG/ML SOLN COMPARISON:  05/14/2020 FINDINGS: CTA CHEST FINDINGS Cardiovascular: Satisfactory opacification of the pulmonary arteries to the segmental level. No evidence of pulmonary embolism. Normal heart size. No pericardial effusion. Aortic atherosclerosis. Previous CABG procedure. Mediastinum/Nodes: No enlarged mediastinal, hilar, or axillary lymph nodes. Thyroid gland, trachea, and esophagus demonstrate no significant findings. Lungs/Pleura: There is a moderate loculated right pleural effusion, new from previous exam. Additionally, there is mild pleural soft tissue thickening overlying the apical portions of the right lung which measures 4 mm in thickness, also new when compared with the previous exam. No focal parenchymal nodule or mass identified. Mild changes of emphysema. Musculoskeletal: No chest wall abnormality. No acute or significant osseous findings. Review of the MIP images confirms the above findings. CT ABDOMEN and PELVIS FINDINGS Hepatobiliary: Normal appearance of the liver. No suspicious  liver abnormality. Small gallstone noted within the dependent portion of the gallbladder. No gallbladder wall thickening or pericholecystic fluid. Pancreas: Unremarkable. No pancreatic ductal dilatation or surrounding inflammatory changes. Spleen: Normal in size without focal abnormality. Adrenals/Urinary Tract: The adrenal glands appear normal. Bilateral kidney cysts. The largest is in the posterolateral cortex of the right mid kidney measuring 2.7 cm. Urinary bladder is unremarkable. Stomach/Bowel: Small hiatal hernia. The appendix is visualized and appears normal. Colonic scratch set sigmoid diverticulosis noted without signs of acute inflammation. No bowel wall thickening, inflammation  or distension. Vascular/Lymphatic: Aortic atherosclerosis. No enlarged abdominal or pelvic lymph nodes. Reproductive: Prostate is unremarkable. Other: No free fluid or fluid collections. Small fat containing umbilical hernia is noted. Musculoskeletal: No acute or significant osseous findings. Review of the MIP images confirms the above findings. IMPRESSION: 1. No evidence for acute pulmonary embolus. 2. New moderate loculated right pleural effusion with mild pleural soft tissue thickening overlying the apical portions of the right lung. Etiology is indeterminate. If there is a clinical concern for pleural spread of disease consider further evaluation with diagnostic thoracentesis. 3. No acute findings within the abdomen or pelvis. 4. Emphysema and aortic atherosclerosis. Aortic Atherosclerosis (ICD10-I70.0) and Emphysema (ICD10-J43.9). Electronically Signed   By: Kerby Moors M.D.   On: 08/01/2020 13:30   DG Chest Portable 1 View  Result Date: 08/01/2020 CLINICAL DATA:  Worsening shortness of breath over the past week. History of right lung cancer status post right upper lobe lobectomy. EXAM: PORTABLE CHEST 1 VIEW COMPARISON:  Chest CT 05/14/2020 and chest x-ray 01/23/2020 FINDINGS: The heart is normal in size and stable. The mediastinal and hilar contours are within normal limits and unchanged. Stable surgical changes from aortic valve replacement surgery. New right, possibly loculated, pleural effusion and overlying atelectasis. This was not present on a recent chest CT from 05/14/2020. Chest CT may be helpful for further evaluation. The left lung is clear. No edema or infiltrates. The bony thorax is intact. IMPRESSION: New right pleural effusion and overlying atelectasis. Electronically Signed   By: Marijo Sanes M.D.   On: 08/01/2020 12:11     ASSESSMENT/PLAN:  This is a very pleasant 75 year old Caucasian male diagnosed with stage IIIb non-small cell lung cancer, adenocarcinoma.  The patient  presented with a right upper lobe lung mass in addition to right hilar and mediastinal lymphadenopathy.  He was diagnosed in August 2021.  He does not have any actionable mutations. His PD-L1 expression is estimated to be in the range of 1 to 49%.  The patient declined enrollment in the alliance clinical trial for treatment with systemic chemotherapy plus or minus immunotherapy.  He underwent adjuvant systemic chemotherapy with cisplatin 75 mg/M2 and Alimta 500 mg/M2 every 3 weeks.  Status post 4 cycles.  He tolerated his treatment fairly well with no concerning adverse effects.  The patient has been on observation and feeling fine until he recently presented to the emergency room for the chief complaint of pleuritic right-sided chest pain.  CT angiogram showed new moderate loculated right pleural effusion with mild pleural soft tissue thickening overlying the apical portions of the right lung. Etiology is indeterminate. If there is a clinical concern for pleural spread of disease consider further evaluation with diagnostic thoracentesis.  The patient was seen with Dr. Julien Nordmann today.  Dr. Julien Nordmann had a lengthy discussion with the patient and his wife today about his CT scan results and for further evaluation.  Dr. Julien Nordmann recommends that we arrange for a therapeutic and  diagnostic thoracentesis to rule out malignant cells.  If the patient does have a malignant pleural effusion, that would mean that he is stage IV.  If positive, I will arrange for the patient to return to the clinic for a more detailed discussion about his current condition and recommended treatment/further management. If negative, then we will likely reschedule a restaging CT scan in ~3 months or so.   For the discomfort, discussed the patient can use tylenol if needed.   The patient was advised to call immediately if he has any concerning symptoms in the interval. The patient voices understanding of current disease status and  treatment options and is in agreement with the current care plan. All questions were answered. The patient knows to call the clinic with any problems, questions or concerns. We can certainly see the patient much sooner if necessary      Orders Placed This Encounter  Procedures  . US Thoracentesis Asp Pleural space w/IMG guide    Standing Status:   Future    Standing Expiration Date:   08/02/2021    Order Specific Question:   Are labs required for specimen collection?    Answer:   Yes    Order Specific Question:   Lab orders requested (DO NOT place separate lab orders, these will be automatically ordered during procedure specimen collection):    Answer:   Cytology - Non Pap    Order Specific Question:   Reason for Exam (SYMPTOM  OR DIAGNOSIS REQUIRED)    Answer:   Please evaluate to make sure not malignancy effusion. Hx stage III lung cancer s/p resection and adjuvant chemotherapy.    Order Specific Question:   Preferred imaging location?    Answer:   [place], PA-C 08/02/20   ADDENDUM: Hematology/Oncology Attending: I had a face-to-face encounter with the patient today.  I reviewed his record, scans and recommended his care plan.  This is a very pleasant 75 years old white male with history of stage IIIb non-small cell lung cancer, adenocarcinoma diagnosed in August 2021 presented with right upper lobe lung mass in addition to right hilar and mediastinal lymphadenopathy with no actionable mutation and PD-L1 expression in the range of 1-49%.  The patient status post right upper lobectomy with lymph node dissection followed by 4 cycles of adjuvant systemic chemotherapy with cisplatin and Alimta and tolerating his treatment fairly well. He presented to the emergency department over the weekend complaining of right-sided chest pain as well as generalized weakness. He had CT angiogram of the chest as well as CT scan of the abdomen and pelvis performed  during his evaluation.  The CT scan of the chest showed new loculated right pleural effusion of of unclear etiology. I had a lengthy discussion with the patient today about his condition.  I recommended for him to have ultrasound-guided right therapeutic and diagnostic thoracentesis. We will call the patient with the result and if the fluid is positive for malignancy, we will see him sooner for evaluation and discussion of his treatment options. We will delay the upcoming visit and scan by few more months if the fluid is negative for malignancy. The patient was advised to call immediately if he has any other concerning symptoms in the interval.  The total time spent in the appointment was 30 minutes. Disclaimer: This note was dictated with voice recognition software. Similar sounding words can inadvertently be transcribed and may be missed upon review. Marcus [name]  Julien Nordmann, MD 08/02/20

## 2020-08-02 NOTE — Telephone Encounter (Signed)
Spoke with pt's wife regarding his appt with Cassie [name], PA this afternoon. Pt's wife verbalizes understanding.

## 2020-08-02 NOTE — Telephone Encounter (Signed)
Scheduled appt per 4/25 sch msg. Pt's wife is aware.

## 2020-08-03 ENCOUNTER — Ambulatory Visit (HOSPITAL_COMMUNITY)
Admission: RE | Admit: 2020-08-03 | Discharge: 2020-08-03 | Disposition: A | Discharge status: Home / Self Care | Payer: Medicare HMO | Source: Ambulatory Visit | Attending: Internal Medicine | Admitting: Internal Medicine

## 2020-08-03 ENCOUNTER — Encounter: Payer: Self-pay | Admitting: Internal Medicine

## 2020-08-03 DIAGNOSIS — C3491 Malignant neoplasm of unspecified part of right bronchus or lung: Secondary | ICD-10-CM | POA: Insufficient documentation

## 2020-08-03 DIAGNOSIS — Z20822 Contact with and (suspected) exposure to covid-19: Secondary | ICD-10-CM | POA: Insufficient documentation

## 2020-08-04 ENCOUNTER — Other Ambulatory Visit: Payer: Self-pay

## 2020-08-04 DIAGNOSIS — C3491 Malignant neoplasm of unspecified part of right bronchus or lung: Secondary | ICD-10-CM

## 2020-08-04 LAB — SARS CORONAVIRUS 2 (TAT 6-24 HRS): SARS Coronavirus 2: NEGATIVE

## 2020-08-04 NOTE — Telephone Encounter (Signed)
We have coordinated for the pt to get his thoracentesis tomorrow 4/28. Pt and his wife has been made aware of this.

## 2020-08-05 ENCOUNTER — Other Ambulatory Visit: Payer: Self-pay | Admitting: Physician Assistant

## 2020-08-05 ENCOUNTER — Other Ambulatory Visit: Payer: Self-pay

## 2020-08-05 ENCOUNTER — Ambulatory Visit (HOSPITAL_COMMUNITY)
Admission: RE | Admit: 2020-08-05 | Discharge: 2020-08-05 | Disposition: A | Discharge status: Home / Self Care | Payer: Medicare HMO | Source: Ambulatory Visit | Attending: Physician Assistant | Admitting: Physician Assistant

## 2020-08-05 ENCOUNTER — Telehealth: Payer: Self-pay | Admitting: *Deleted

## 2020-08-05 DIAGNOSIS — C3491 Malignant neoplasm of unspecified part of right bronchus or lung: Secondary | ICD-10-CM

## 2020-08-05 DIAGNOSIS — J90 Pleural effusion, not elsewhere classified: Secondary | ICD-10-CM | POA: Insufficient documentation

## 2020-08-05 DIAGNOSIS — J948 Other specified pleural conditions: Secondary | ICD-10-CM | POA: Diagnosis not present

## 2020-08-05 DIAGNOSIS — J910 Malignant pleural effusion: Secondary | ICD-10-CM | POA: Diagnosis not present

## 2020-08-05 HISTORY — PX: IR THORACENTESIS ASP PLEURAL SPACE W/IMG GUIDE: IMG5380

## 2020-08-05 MED ORDER — LIDOCAINE HCL (PF) 1 % IJ SOLN
INTRAMUSCULAR | Status: DC | PRN
  Administered 2020-08-05: 10 mL

## 2020-08-05 NOTE — Procedures (Signed)
PROCEDURE SUMMARY:  [place] right chest finds severely loculated small pleural effusion. Pt noted to have prior surgical and radiation therapy history to that right side.  Technically successful US guided right thoracentesis. Yielded only 34mL of hazy serosanguinous fluid. Pt tolerated procedure well. No immediate complications.  Specimen was sent for labs. CXR ordered.  EBL < 5 mL  Ascencion Dike PA-C 08/05/2020 11:43 AM

## 2020-08-05 NOTE — Chronic Care Management (AMB) (Signed)
  Chronic Care Management   Outreach Note  08/05/2020 Name: TAHA DIMOND MRN: [id number] DOB: [birth year]  Marny Lowenstein is a 75 y.o. year old male who is a primary care patient of Birdie Riddle, Aundra Millet, MD. I reached out to Marny Lowenstein by phone today in response to a referral sent by Mr. Chandan Fly [facility] health plan.     An unsuccessful telephone outreach was attempted today. The patient was referred to the case management team for assistance with care management and care coordination.   Follow Up Plan: The care management team will reach out to the patient again over the next 1 day.  If patient returns call to provider office, please advise to call [facility] at [phone].  [facility]

## 2020-08-06 ENCOUNTER — Ambulatory Visit (HOSPITAL_COMMUNITY): Payer: Medicare HMO

## 2020-08-06 ENCOUNTER — Encounter (HOSPITAL_COMMUNITY): Payer: Self-pay

## 2020-08-06 ENCOUNTER — Ambulatory Visit (INDEPENDENT_AMBULATORY_CARE_PROVIDER_SITE_OTHER): Payer: Medicare HMO | Admitting: Pharmacist Clinician (PhC)/ Clinical Pharmacy Specialist

## 2020-08-06 DIAGNOSIS — Z79899 Other long term (current) drug therapy: Secondary | ICD-10-CM

## 2020-08-06 DIAGNOSIS — Z952 Presence of prosthetic heart valve: Secondary | ICD-10-CM

## 2020-08-06 LAB — POCT INR: INR: 6 — AB (ref 2.0–3.0)

## 2020-08-09 ENCOUNTER — Encounter: Payer: Self-pay | Admitting: Internal Medicine

## 2020-08-09 ENCOUNTER — Telehealth: Payer: Self-pay | Admitting: Physician Assistant

## 2020-08-09 ENCOUNTER — Ambulatory Visit (INDEPENDENT_AMBULATORY_CARE_PROVIDER_SITE_OTHER): Payer: Medicare HMO | Admitting: Pharmacist Clinician (PhC)/ Clinical Pharmacy Specialist

## 2020-08-09 ENCOUNTER — Ambulatory Visit: Payer: Medicare HMO | Admitting: Cardiovascular Disease

## 2020-08-09 ENCOUNTER — Telehealth: Payer: Self-pay | Admitting: *Deleted

## 2020-08-09 DIAGNOSIS — Z7901 Long term (current) use of anticoagulants: Secondary | ICD-10-CM

## 2020-08-09 DIAGNOSIS — Z79899 Other long term (current) drug therapy: Secondary | ICD-10-CM

## 2020-08-09 DIAGNOSIS — I4891 Unspecified atrial fibrillation: Secondary | ICD-10-CM | POA: Diagnosis not present

## 2020-08-09 DIAGNOSIS — Z952 Presence of prosthetic heart valve: Secondary | ICD-10-CM | POA: Diagnosis not present

## 2020-08-09 DIAGNOSIS — I9789 Other postprocedural complications and disorders of the circulatory system, not elsewhere classified: Secondary | ICD-10-CM

## 2020-08-09 LAB — POCT INR: INR: 1.9 — AB (ref 2.0–3.0)

## 2020-08-09 NOTE — Progress Notes (Signed)
[facility] OFFICE PROGRESS NOTE  [name], MD [address] [place] [id number]  DIAGNOSIS: Stage IIIb (T3, N2, M0) non-small cell lung cancer, adenocarcinoma presented with right upper lobe lung mass in addition to right hilar and mediastinal lymphadenopathy. Diagnosed inAugust 30, 2021.  Molecular Studies: Negative for actionable mutations  PDL1: 1-49%  PRIOR THERAPY: 1) Right upper lobectomy with lymph node sampling under the care of Dr. Kipp Brood on December 08, 2019. 2) Adjuvant systemic chemotherapy with Cisplatin75 mg/m2and Alimta 500 mg/m2 IV every 3 weeks. Last dose on 04/13/20. Status post 4 cycles.  CURRENT THERAPY: Observation  INTERVAL HISTORY: RANFERI CLINGAN 75 y.o. male returns to the clinic today for a follow-up visit accompanied by his wife.  The patient has a history of stage III non-small cell lung cancer status post resection and adjuvant chemotherapy.  He is currently on observation. He has noticed increased shortness of breath and a right posterior/lateral rib pain with inspiration.  He had a repeat chest x-ray which showed a new right pleural effusion with overlying atelectasis.  The patient presented to the emergency room on 08/01/2020 for this complaint. He had a CT angiogram  performed which showed new loculated pleural effusion on the right lobe with mild soft tissue thickening which could be concerning for possible disease recurrence. Overall, the patient was well-appearing without evidence of respiratory distress and he was discharged with close follow-up with oncology. Of note, with the original surgical resection of his malignancy in August 2021, there was involvement of the subpleural connective tissue on the pathology report. He was seen in the clinic on 08/02/20. A diagnostic thoracentesis was performed which 5 mL of fluid was drawn. The cytology is not back and pathology is still running staining.   The patient's wife called  the office to arrange for a follow-up and he is being seen today to further discuss these findings and recommendations for further evaluation. He is endorsing some pleuritic chest discomfort with inspiration. He describes the chest discomfort as sore in the anterior right rib cage. He denies falls or injuries. He takes 1 tylenol which eases off the pain; however, it reportedly was affecting his INR. Tramadol is too strong for him as well. He reports a new cough and nasal congestion but states it is secondary to allergies/pollen. He takes zyrtec. Of note, he was tested for COVID-19 last week for his pre-procedure test which was negative. Denies sore throat. His temperature has been ~99. Denies fever of >100.  Denies any recent chills or night sweats. He lost 5 lbs since his last appointment. No evidence of thrush. He denies any hemoptysis.  He denies any nausea, vomiting, diarrhea, or constipation.  He denies any headache or visual changes.  He is here today for evaluation regarding these findings and further management.   MEDICAL HISTORY: Past Medical History:  Diagnosis Date  . Atrial fibrillation ([place])   . Dyspnea    with exertion   . Dysrhythmia   . Hypertension   . Obstructive sleep apnea 10/25/2007   cpap  . S/P AVR    2D ECHO, 04/25/2011 - EF >55%, Right ventricle-mild-moderately dilated  . Swelling of limb    LEA VENOUS, 08/22/2009 - no evidence of deep vein or superficial thrombosis; partially rupturing Baker's Cyst    ALLERGIES:  is allergic to amlodipine, sulfa antibiotics, tetanus toxoids, crestor [rosuvastatin], and penicillins.  MEDICATIONS:  Current Outpatient Medications  Medication Sig Dispense Refill  . acetaminophen (  TYLENOL) 325 MG tablet Take 2 tablets (650 mg total) by mouth every 6 (six) hours as needed for mild pain.    Marland Kitchen ALPRAZolam (XANAX) 0.25 MG tablet Take 1 tablet (0.25 mg total) by mouth every 8 (eight) hours as needed for up to 20 doses for anxiety. 20 tablet 0  .  aspirin EC 81 MG tablet Take 81 mg by mouth daily.    Marland Kitchen atorvastatin (LIPITOR) 80 MG tablet TAKE 1 TABLET BY MOUTH EVERY DAY (Patient taking differently: Take 80 mg by mouth daily.) 90 tablet 2  . carboxymethylcellulose (REFRESH PLUS) 0.5 % SOLN Place 1 drop into both eyes daily as needed (dry eyes).     . cetirizine (ZYRTEC) 10 MG tablet Take 10 mg by mouth daily.    Marland Kitchen dexamethasone (DECADRON) 4 MG tablet Please take 1 tablet twice daily the day before, the day of, and the day after chemotherapy (Patient not taking: Reported on 08/02/2020) 40 tablet 2  . enoxaparin (LOVENOX) 120 MG/0.8ML injection Inject 1 syringe (120 mg) once daily as directed by coumadin clinic (Patient not taking: Reported on 08/02/2020) 8 mL 0  . fexofenadine (ALLEGRA) 180 MG tablet Take 180 mg by mouth every evening.    . folic acid (FOLVITE) 1 MG tablet TAKE 1 TABLET BY MOUTH EVERY DAY (Patient not taking: Reported on 08/02/2020) 30 tablet 0  . furosemide (LASIX) 20 MG tablet TAKE 1 TABLET AS NEEDED FOR SWELLING (Patient taking differently: Take 20 mg by mouth daily. Take 1 tablet as needed for swelling) 30 tablet 3  . Multiple Vitamins-Minerals (EYE SUPPORT PO) Take 1 tablet by mouth daily.    . NONFORMULARY OR COMPOUNDED ITEM Apply 2 sprays topically as directed. Testosterone Topical  Preparation 100 mg (Compounded by [place])  Apply 2 clicks (50 mg) daily    . prochlorperazine (COMPAZINE) 10 MG tablet Take 1 tablet (10 mg total) by mouth every 6 (six) hours as needed. (Patient not taking: Reported on 08/02/2020) 30 tablet 2  . ramipril (ALTACE) 10 MG capsule TAKE 1 CAPSULE BY MOUTH EVERY DAY (Patient taking differently: Take 10 mg by mouth daily.) 90 capsule 3  . warfarin (COUMADIN) 10 MG tablet TAKE 1 TO 1 AND 1/2 TABLETS DAILY AS DIRECTED BY COUMADIN CLINIC (Patient taking differently: Take 10-15 mg by mouth daily at 4 PM. 10 mg everyday except Tuesday & Thursday patient takes 15 mg) 135 tablet 1    No current facility-administered medications for this visit.    SURGICAL HISTORY:  Past Surgical History:  Procedure Laterality Date  . CARDIAC CATHETERIZATION Bilateral 05/10/2007   Significant 1-vessel disease, severely dilated aortic root with moderate severe aortic insufficiency  . CARDIAC SURGERY    . CARDIOVERSION  08/02/2007   150 Joule biphasic shock with restoration of sinus rhythm. Heart rate 60.  . cataract surgery     . COLONOSCOPY WITH PROPOFOL N/A 12/15/2016   Procedure: COLONOSCOPY WITH PROPOFOL;  Surgeon: Carol Ada, MD;  Location: [facility];  Service: Endoscopy;  Laterality: N/A;  . EYE SURGERY    . INTERCOSTAL NERVE BLOCK Right 12/08/2019   Procedure: INTERCOSTAL NERVE BLOCK;  Surgeon: [name], MD;  Location: [facility];  Service: Thoracic;  Laterality: Right;  . IR THORACENTESIS ASP PLEURAL SPACE W/IMG GUIDE  08/05/2020  . NM MYOVIEW LTD  04/08/2007   No evidence of inducible myocardial ischemia  . NODE DISSECTION Right 12/08/2019   Procedure: NODE DISSECTION;  Surgeon: [name], MD;  Location:  [facility];  Service: Thoracic;  Laterality: Right;  . THYROIDECTOMY, PARTIAL    . torn meniscus in right knee surgery       REVIEW OF SYSTEMS:   Review of Systems  Constitutional: Positive for fatigue, generalized weakness, decreased appetite, and weight loss. Negative for chills and fever. HENT: Negative for mouth sores, nosebleeds, sore throat and trouble swallowing.   Eyes: Negative for eye problems and icterus.  Respiratory: Positive for right sided chest soreness and pleuritic chest pain. Positive for cough. Negative for hemoptysis and wheezing.   Cardiovascular: Negative for leg swelling.  Gastrointestinal: Negative for abdominal pain, constipation, diarrhea, nausea and vomiting.  Genitourinary: Negative for bladder incontinence, difficulty urinating, dysuria, frequency and hematuria.   Musculoskeletal: Negative for back pain, gait problem, neck  pain and neck stiffness.  Skin: Negative for itching and rash.  Neurological: Negative for dizziness, extremity weakness, gait problem, headaches, light-headedness and seizures.  Hematological: Negative for adenopathy. Does not bruise/bleed easily.  Psychiatric/Behavioral: Negative for confusion, depression and sleep disturbance. The patient is not nervous/anxious.     PHYSICAL EXAMINATION:  There were no vitals taken for this visit.  ECOG PERFORMANCE STATUS: 1 - Symptomatic but completely ambulatory  Physical Exam  Constitutional: Oriented to person, place, and time and well-developed, well-nourished, and in no distress.  HENT:  Head: Normocephalic and atraumatic.  Mouth/Throat: Oropharynx is clear and moist. No oropharyngeal exudate.  Eyes: Conjunctivae are normal. Right eye exhibits no discharge. Left eye exhibits no discharge. No scleral icterus.  Neck: Normal range of motion. Neck supple.  Cardiovascular: Normal rate, regular rhythm, normal heart sounds and intact distal pulses.   Pulmonary/Chest: Effort normal and breath sounds normal. No respiratory distress. No wheezes. No rales.  Abdominal: Soft. Bowel sounds are normal. Exhibits no distension and no mass. There is no tenderness.  Musculoskeletal: Tenderness to palpation over right anterior chest wall. Normal range of motion. Exhibits no edema.  Lymphadenopathy:    No cervical adenopathy.  Neurological: Alert and oriented to person, place, and time. Exhibits normal muscle tone. Gait normal. Coordination normal.  Skin: Skin is warm and dry. No rash noted. Not diaphoretic. No erythema. No pallor.  Psychiatric: Mood, memory and judgment normal.  Vitals reviewed.  LABORATORY DATA: Lab Results  Component Value Date   WBC 5.8 08/01/2020   HGB 10.0 (L) 08/01/2020   HCT 30.7 (L) 08/01/2020   MCV 99.7 08/01/2020   PLT 187 08/01/2020      Chemistry      Component Value Date/Time   NA 136 08/01/2020 1134   NA 139 10/28/2019  1057   K 4.7 08/01/2020 1134   CL 103 08/01/2020 1134   CO2 27 08/01/2020 1134   BUN 16 08/01/2020 1134   BUN 10 10/28/2019 1057   CREATININE 0.94 08/01/2020 1134   CREATININE 0.94 05/14/2020 1336      Component Value Date/Time   CALCIUM 8.7 (L) 08/01/2020 1134   ALKPHOS 71 08/01/2020 1134   AST 26 08/01/2020 1134   AST 28 05/14/2020 1336   ALT 14 08/01/2020 1134   ALT 15 05/14/2020 1336   BILITOT 0.8 08/01/2020 1134   BILITOT 0.6 05/14/2020 1336       RADIOGRAPHIC STUDIES:  DG Chest 1 View  Result Date: 08/05/2020 CLINICAL DATA:  History of adenocarcinoma of the RIGHT lung found to have increasing pleural fluid. EXAM: CHEST  1 VIEW COMPARISON:  Prior chest CTs most recent of August 01, 2020 FINDINGS: Post median sternotomy for coronary revascularization.  Post partial lung resection as before. Loculated pleural fluid along the RIGHT lateral chest and above the RIGHT hemidiaphragm tracking into the fissural reflections in the RIGHT chest showing similar configuration when compared to recent imaging. Volume grossly similar to the prior study. Paramediastinal density along the RIGHT superior mediastinum with similar appearance likely reflecting post treatment changes. No pneumothorax. LEFT lung is clear. On limited assessment no acute skeletal process. IMPRESSION: 1. Loculated pleural fluid along the RIGHT lateral chest and above the RIGHT hemidiaphragm tracking into the fissural reflections in the RIGHT chest showing similar configuration when compared to recent imaging. Perhaps slightly decreased as compared to scout image from recent CT along the lateral chest. No pneumothorax visualized. Electronically Signed   By: Zetta Bills M.D.   On: 08/05/2020 12:02   CT Angio Chest PE W and/or Wo Contrast  Result Date: 08/01/2020 CLINICAL DATA:  Evaluate for acute pulmonary embolus. History of lung cancer. Now with shortness of breath EXAM: CT ANGIOGRAPHY CHEST CT ABDOMEN AND PELVIS WITH  CONTRAST TECHNIQUE: Multidetector CT imaging of the chest was performed using the standard protocol during bolus administration of intravenous contrast. Multiplanar CT image reconstructions and MIPs were obtained to evaluate the vascular anatomy. Multidetector CT imaging of the abdomen and pelvis was performed using the standard protocol during bolus administration of intravenous contrast. CONTRAST:  17m OMNIPAQUE IOHEXOL 350 MG/ML SOLN COMPARISON:  05/14/2020 FINDINGS: CTA CHEST FINDINGS Cardiovascular: Satisfactory opacification of the pulmonary arteries to the segmental level. No evidence of pulmonary embolism. Normal heart size. No pericardial effusion. Aortic atherosclerosis. Previous CABG procedure. Mediastinum/Nodes: No enlarged mediastinal, hilar, or axillary lymph nodes. Thyroid gland, trachea, and esophagus demonstrate no significant findings. Lungs/Pleura: There is a moderate loculated right pleural effusion, new from previous exam. Additionally, there is mild pleural soft tissue thickening overlying the apical portions of the right lung which measures 4 mm in thickness, also new when compared with the previous exam. No focal parenchymal nodule or mass identified. Mild changes of emphysema. Musculoskeletal: No chest wall abnormality. No acute or significant osseous findings. Review of the MIP images confirms the above findings. CT ABDOMEN and PELVIS FINDINGS Hepatobiliary: Normal appearance of the liver. No suspicious liver abnormality. Small gallstone noted within the dependent portion of the gallbladder. No gallbladder wall thickening or pericholecystic fluid. Pancreas: Unremarkable. No pancreatic ductal dilatation or surrounding inflammatory changes. Spleen: Normal in size without focal abnormality. Adrenals/Urinary Tract: The adrenal glands appear normal. Bilateral kidney cysts. The largest is in the posterolateral cortex of the right mid kidney measuring 2.7 cm. Urinary bladder is unremarkable.  Stomach/Bowel: Small hiatal hernia. The appendix is visualized and appears normal. Colonic scratch set sigmoid diverticulosis noted without signs of acute inflammation. No bowel wall thickening, inflammation or distension. Vascular/Lymphatic: Aortic atherosclerosis. No enlarged abdominal or pelvic lymph nodes. Reproductive: Prostate is unremarkable. Other: No free fluid or fluid collections. Small fat containing umbilical hernia is noted. Musculoskeletal: No acute or significant osseous findings. Review of the MIP images confirms the above findings. IMPRESSION: 1. No evidence for acute pulmonary embolus. 2. New moderate loculated right pleural effusion with mild pleural soft tissue thickening overlying the apical portions of the right lung. Etiology is indeterminate. If there is a clinical concern for pleural spread of disease consider further evaluation with diagnostic thoracentesis. 3. No acute findings within the abdomen or pelvis. 4. Emphysema and aortic atherosclerosis. Aortic Atherosclerosis (ICD10-I70.0) and Emphysema (ICD10-J43.9). Electronically Signed   By: TKerby MoorsM.D.   On: 08/01/2020 13:30  CT ABDOMEN PELVIS W CONTRAST  Result Date: 08/01/2020 CLINICAL DATA:  Evaluate for acute pulmonary embolus. History of lung cancer. Now with shortness of breath EXAM: CT ANGIOGRAPHY CHEST CT ABDOMEN AND PELVIS WITH CONTRAST TECHNIQUE: Multidetector CT imaging of the chest was performed using the standard protocol during bolus administration of intravenous contrast. Multiplanar CT image reconstructions and MIPs were obtained to evaluate the vascular anatomy. Multidetector CT imaging of the abdomen and pelvis was performed using the standard protocol during bolus administration of intravenous contrast. CONTRAST:  156m OMNIPAQUE IOHEXOL 350 MG/ML SOLN COMPARISON:  05/14/2020 FINDINGS: CTA CHEST FINDINGS Cardiovascular: Satisfactory opacification of the pulmonary arteries to the segmental level. No evidence  of pulmonary embolism. Normal heart size. No pericardial effusion. Aortic atherosclerosis. Previous CABG procedure. Mediastinum/Nodes: No enlarged mediastinal, hilar, or axillary lymph nodes. Thyroid gland, trachea, and esophagus demonstrate no significant findings. Lungs/Pleura: There is a moderate loculated right pleural effusion, new from previous exam. Additionally, there is mild pleural soft tissue thickening overlying the apical portions of the right lung which measures 4 mm in thickness, also new when compared with the previous exam. No focal parenchymal nodule or mass identified. Mild changes of emphysema. Musculoskeletal: No chest wall abnormality. No acute or significant osseous findings. Review of the MIP images confirms the above findings. CT ABDOMEN and PELVIS FINDINGS Hepatobiliary: Normal appearance of the liver. No suspicious liver abnormality. Small gallstone noted within the dependent portion of the gallbladder. No gallbladder wall thickening or pericholecystic fluid. Pancreas: Unremarkable. No pancreatic ductal dilatation or surrounding inflammatory changes. Spleen: Normal in size without focal abnormality. Adrenals/Urinary Tract: The adrenal glands appear normal. Bilateral kidney cysts. The largest is in the posterolateral cortex of the right mid kidney measuring 2.7 cm. Urinary bladder is unremarkable. Stomach/Bowel: Small hiatal hernia. The appendix is visualized and appears normal. Colonic scratch set sigmoid diverticulosis noted without signs of acute inflammation. No bowel wall thickening, inflammation or distension. Vascular/Lymphatic: Aortic atherosclerosis. No enlarged abdominal or pelvic lymph nodes. Reproductive: Prostate is unremarkable. Other: No free fluid or fluid collections. Small fat containing umbilical hernia is noted. Musculoskeletal: No acute or significant osseous findings. Review of the MIP images confirms the above findings. IMPRESSION: 1. No evidence for acute pulmonary  embolus. 2. New moderate loculated right pleural effusion with mild pleural soft tissue thickening overlying the apical portions of the right lung. Etiology is indeterminate. If there is a clinical concern for pleural spread of disease consider further evaluation with diagnostic thoracentesis. 3. No acute findings within the abdomen or pelvis. 4. Emphysema and aortic atherosclerosis. Aortic Atherosclerosis (ICD10-I70.0) and Emphysema (ICD10-J43.9). Electronically Signed   By: TKerby MoorsM.D.   On: 08/01/2020 13:30   DG Chest Portable 1 View  Result Date: 08/01/2020 CLINICAL DATA:  Worsening shortness of breath over the past week. History of right lung cancer status post right upper lobe lobectomy. EXAM: PORTABLE CHEST 1 VIEW COMPARISON:  Chest CT 05/14/2020 and chest x-ray 01/23/2020 FINDINGS: The heart is normal in size and stable. The mediastinal and hilar contours are within normal limits and unchanged. Stable surgical changes from aortic valve replacement surgery. New right, possibly loculated, pleural effusion and overlying atelectasis. This was not present on a recent chest CT from 05/14/2020. Chest CT may be helpful for further evaluation. The left lung is clear. No edema or infiltrates. The bony thorax is intact. IMPRESSION: New right pleural effusion and overlying atelectasis. Electronically Signed   By: PMarijo SanesM.D.   On: 08/01/2020 12:11   IR  THORACENTESIS ASP PLEURAL SPACE W/IMG GUIDE  Result Date: 08/05/2020 INDICATION: History of right-sided lung cancer, previously treated. Now found to have loculated right pleural effusion. Request diagnostic and possibly therapeutic thoracentesis. EXAM: ULTRASOUND GUIDED RIGHT THORACENTESIS MEDICATIONS: 1% plain lidocaine, 5 mL COMPLICATIONS: None immediate. PROCEDURE: An ultrasound guided thoracentesis was thoroughly discussed with the patient and questions answered. The benefits, risks, alternatives and complications were also discussed. The  patient understands and wishes to proceed with the procedure. Written consent was obtained. Ultrasound of the right chest demonstrates severely loculated small right pleural effusion. Multiple septations noted. Largest pocket chosen for attempt at thoracentesis. Ultrasound was performed to localize and mark an adequate pocket of fluid in the right chest. The area was then prepped and draped in the normal sterile fashion. 1% Lidocaine was used for local anesthesia. Under ultrasound guidance a 6 Fr Safe-T-Centesis catheter was introduced. Thoracentesis was performed. The catheter was removed and a dressing applied. FINDINGS: A total of approximately only 5 mL of hazy, serosanguineous fluid was removed. Samples were sent to the laboratory as requested by the clinical team. IMPRESSION: Successful ultrasound guided right thoracentesis yielding 5 mL of pleural fluid. Read by: Ascencion Dike PA-C Electronically Signed   By: Jacqulynn Cadet M.D.   On: 08/05/2020 12:15     ASSESSMENT/PLAN:  This is a very pleasant 75 year old Caucasian male diagnosed with stage IIIb non-small cell lung cancer, adenocarcinoma. The patient presented with a right upper lobe lung mass in addition to right hilar and mediastinal lymphadenopathy. He was diagnosed in August 2021. He does not have any actionable mutations. His PD-L1 expression is estimated to be in the range of 1 to 49%.  The patient declined enrollment in the alliance clinical trial for treatment with systemic chemotherapy plus or minus immunotherapy.  Heunderwentadjuvant systemic chemotherapy with cisplatin 75 mg/M2 and Alimta 500 mg/M2 every 3 weeks. Status post 4cycles.He tolerated his treatment fairly well with no concerning adverse effects.  The patient has been on observation and feeling fine until he recently presented to the emergency room for the chief complaint of pleuritic right-sided chest pain.  CT angiogram showed new moderate loculated right  pleural effusion with mild pleural soft tissue thickening overlying the apical portions of the right lung. Etiology is indeterminate. If there is a clinical concern for pleural spread of disease consider further evaluation with diagnostic thoracentesis.  He had a thoracentesis which yielded 5 ml of fluid. The cytology is still pending.   The patient was seen with Dr. Julien Nordmann today.  Dr. Julien Nordmann had a lengthy discussion with the patient about his current condition. From reviewing his original pathology report, there was some involvement of the subpleural connective tissue. Therefore, there is concern that this is disease recurrence.  Dr. Julien Nordmann recommends ordering a PET scan to evaluate for recurrence.   Regarding pain control, we will try prescribing norco. I believe his anterior chest wall pain is costochondritis from coughing. He recently started taking robitussin which helps him. He was also advised to use a heating pad.   For his fatigue and decreased appetite, we will prescribe a medrol dose pack.   The patient was advised to call immediately if he has any concerning symptoms in the interval. The patient voices understanding of current disease status and treatment options and is in agreement with the current care plan. All questions were answered. The patient knows to call the clinic with any problems, questions or concerns. We can certainly see the patient much sooner if necessary  No orders of the defined types were placed in this encounter.     Nazaret Chea L Lachrisha Ziebarth, PA-C 08/09/20  ADDENDUM: Hematology/Oncology Attending: I had a face-to-face encounter with the patient today.  I reviewed his record, scans and recommended his care plan.  This is a very pleasant 75 years old white male with a stage IIIb non-small cell lung cancer, adenocarcinoma status post right upper lobectomy with lymph node sampling under the care of Dr. Kipp Brood on December 08, 2019 followed by  4 cycles of adjuvant systemic chemotherapy with cisplatin and Alimta.  Last dose was given in January 2022. The patient is currently on observation but he has been complaining of pain on the right side of the chest and the most recent CT scan of the chest showed loculated effusion on the right side with suspicious pleural thickening concerning for disease recurrence. The patient underwent ultrasound-guided thoracentesis but they were able to obtain only 5 mL of pleural fluid which was sent for cytologic evaluation and the final pathology is still pending. I had a lengthy discussion with the patient and his wife today about his condition.  We will consider him for a PET scan for further evaluation of his disease and to rule out disease recurrence in the right pleural space especially with his high risk of recurrence. We will also wait for the final pathology before making any further recommendation. For pain management we will start the patient on Norco on as-needed basis. He will come back for follow-up visit in 2 weeks or sooner for further evaluation and discussion of his condition and treatment options based on the final pathology and the PET scan results. The patient was advised to call immediately if he has any other concerning symptoms in the interval. The total time spent in the appointment was 30 minutes. Disclaimer: This note was dictated with voice recognition software. Similar sounding words can inadvertently be transcribed and may be missed upon review. Eilleen Kempf, MD 08/10/20

## 2020-08-09 NOTE — Chronic Care Management (AMB) (Signed)
  Chronic Care Management   Note  08/09/2020 Name: Marcus [name] MRN: [id number] DOB: 1945-10-22  Marcus [name] is a 75 y.o. year old male who is a primary care patient of Birdie Riddle, Aundra Millet, MD. I reached out to Marcus [name] by phone today in response to a referral sent by Marcus [name] health plan.     Marcus [name] was given information about Chronic Care Management services today including:  1. CCM service includes personalized support from designated clinical staff supervised by his physician, including individualized plan of care and coordination with other care providers 2. 24/7 contact phone numbers for assistance for urgent and routine care needs. 3. Service will only be billed when office clinical staff spend 20 minutes or more in a month to coordinate care. 4. Only one practitioner may furnish and bill the service in a calendar month. 5. The patient may stop CCM services at any time (effective at the end of the month) by phone call to the office staff. 6. The patient will be responsible for cost sharing (co-pay) of up to 20% of the service fee (after annual deductible is met).  Patient spouse Marcus [name] wishes to consider information provided and/or speak with a member of the care team before deciding about enrollment in care management services.   Follow up plan: Patient spouse declines engagement by the care management team. Appropriate care team members and provider have been notified via electronic communication. The care management team is available to follow up with the patient after provider conversation with the patient regarding recommendation for care management engagement and subsequent re-referral to the care management team.   [place] [facility]

## 2020-08-09 NOTE — Telephone Encounter (Signed)
Notified of message that scheduler will be calling him to make an appt tomorrow with Cassie to evaluate. Pathology is not back.

## 2020-08-09 NOTE — Telephone Encounter (Signed)
Scheduled appt per 5/2 sch msg. Pt aware.

## 2020-08-10 ENCOUNTER — Other Ambulatory Visit: Payer: Self-pay

## 2020-08-10 ENCOUNTER — Other Ambulatory Visit: Payer: Self-pay | Admitting: Cardiovascular Disease

## 2020-08-10 ENCOUNTER — Inpatient Hospital Stay: Payer: Medicare HMO | Attending: Internal Medicine | Admitting: Physician Assistant

## 2020-08-10 VITALS — BP 147/77 | HR 72 | Temp 98.2°F | Resp 20 | Ht 71.0 in | Wt 177.5 lb

## 2020-08-10 DIAGNOSIS — Z902 Acquired absence of lung [part of]: Secondary | ICD-10-CM | POA: Insufficient documentation

## 2020-08-10 DIAGNOSIS — R0789 Other chest pain: Secondary | ICD-10-CM | POA: Diagnosis not present

## 2020-08-10 DIAGNOSIS — R5383 Other fatigue: Secondary | ICD-10-CM | POA: Insufficient documentation

## 2020-08-10 DIAGNOSIS — Z5112 Encounter for antineoplastic immunotherapy: Secondary | ICD-10-CM | POA: Insufficient documentation

## 2020-08-10 DIAGNOSIS — R630 Anorexia: Secondary | ICD-10-CM | POA: Diagnosis not present

## 2020-08-10 DIAGNOSIS — Z79899 Other long term (current) drug therapy: Secondary | ICD-10-CM | POA: Diagnosis not present

## 2020-08-10 DIAGNOSIS — J90 Pleural effusion, not elsewhere classified: Secondary | ICD-10-CM | POA: Insufficient documentation

## 2020-08-10 DIAGNOSIS — R0781 Pleurodynia: Secondary | ICD-10-CM | POA: Diagnosis not present

## 2020-08-10 DIAGNOSIS — C3491 Malignant neoplasm of unspecified part of right bronchus or lung: Secondary | ICD-10-CM | POA: Diagnosis not present

## 2020-08-10 DIAGNOSIS — Z5111 Encounter for antineoplastic chemotherapy: Secondary | ICD-10-CM | POA: Insufficient documentation

## 2020-08-10 DIAGNOSIS — C3411 Malignant neoplasm of upper lobe, right bronchus or lung: Secondary | ICD-10-CM | POA: Insufficient documentation

## 2020-08-10 MED ORDER — HYDROCODONE-ACETAMINOPHEN 5-325 MG PO TABS: 1.0000 | ORAL_TABLET | Freq: Four times a day (QID) | ORAL | 0 refills | Status: DC | PRN

## 2020-08-10 MED ORDER — METHYLPREDNISOLONE 4 MG PO TBPK: ORAL_TABLET | ORAL | 0 refills | Status: DC

## 2020-08-11 ENCOUNTER — Telehealth: Payer: Self-pay | Admitting: Physician Assistant

## 2020-08-11 ENCOUNTER — Telehealth: Payer: Self-pay | Admitting: Internal Medicine

## 2020-08-11 LAB — CYTOLOGY - NON PAP

## 2020-08-11 NOTE — Telephone Encounter (Signed)
Called pt to r/s appt per 5/4 sch msg. No answer. Left msg for pt to call back to r/s.

## 2020-08-11 NOTE — Telephone Encounter (Signed)
R/s appt per 5/4 sch msg. Pt's wife if aware.

## 2020-08-11 NOTE — Telephone Encounter (Signed)
I called the patient's wife to review the cytology which is suspicious for malignancy; however, there was not enough cellularity in the same to run immunohistochemistry staining. The plan is the same. We would still like him to have the PET scan which is scheduled for 08/20/20. We will follow up with him on 08/25/20 to review the results. She also noted that Hussam is feeling better today as well.

## 2020-08-13 ENCOUNTER — Other Ambulatory Visit: Payer: Self-pay

## 2020-08-13 ENCOUNTER — Ambulatory Visit (INDEPENDENT_AMBULATORY_CARE_PROVIDER_SITE_OTHER): Payer: Medicare HMO | Admitting: Cardiology

## 2020-08-13 ENCOUNTER — Ambulatory Visit (INDEPENDENT_AMBULATORY_CARE_PROVIDER_SITE_OTHER): Payer: Medicare HMO | Admitting: Family Medicine

## 2020-08-13 ENCOUNTER — Encounter: Payer: Self-pay | Admitting: Family Medicine

## 2020-08-13 VITALS — BP 120/80 | Temp 97.3°F | Resp 17 | Ht 70.0 in | Wt 177.0 lb

## 2020-08-13 DIAGNOSIS — E559 Vitamin D deficiency, unspecified: Secondary | ICD-10-CM

## 2020-08-13 DIAGNOSIS — J449 Chronic obstructive pulmonary disease, unspecified: Secondary | ICD-10-CM | POA: Diagnosis not present

## 2020-08-13 DIAGNOSIS — R143 Flatulence: Secondary | ICD-10-CM | POA: Insufficient documentation

## 2020-08-13 DIAGNOSIS — R350 Frequency of micturition: Secondary | ICD-10-CM | POA: Diagnosis not present

## 2020-08-13 DIAGNOSIS — Z860100 Personal history of colon polyps, unspecified: Secondary | ICD-10-CM | POA: Insufficient documentation

## 2020-08-13 DIAGNOSIS — Z952 Presence of prosthetic heart valve: Secondary | ICD-10-CM

## 2020-08-13 DIAGNOSIS — R141 Gas pain: Secondary | ICD-10-CM | POA: Insufficient documentation

## 2020-08-13 DIAGNOSIS — E7800 Pure hypercholesterolemia, unspecified: Secondary | ICD-10-CM

## 2020-08-13 DIAGNOSIS — K5730 Diverticulosis of large intestine without perforation or abscess without bleeding: Secondary | ICD-10-CM | POA: Insufficient documentation

## 2020-08-13 DIAGNOSIS — Z79899 Other long term (current) drug therapy: Secondary | ICD-10-CM

## 2020-08-13 DIAGNOSIS — D689 Coagulation defect, unspecified: Secondary | ICD-10-CM | POA: Insufficient documentation

## 2020-08-13 DIAGNOSIS — R197 Diarrhea, unspecified: Secondary | ICD-10-CM | POA: Insufficient documentation

## 2020-08-13 DIAGNOSIS — R1084 Generalized abdominal pain: Secondary | ICD-10-CM | POA: Insufficient documentation

## 2020-08-13 DIAGNOSIS — Z125 Encounter for screening for malignant neoplasm of prostate: Secondary | ICD-10-CM | POA: Diagnosis not present

## 2020-08-13 DIAGNOSIS — D500 Iron deficiency anemia secondary to blood loss (chronic): Secondary | ICD-10-CM | POA: Insufficient documentation

## 2020-08-13 DIAGNOSIS — Z0000 Encounter for general adult medical examination without abnormal findings: Secondary | ICD-10-CM | POA: Diagnosis not present

## 2020-08-13 DIAGNOSIS — R142 Eructation: Secondary | ICD-10-CM | POA: Insufficient documentation

## 2020-08-13 LAB — VITAMIN D 25 HYDROXY (VIT D DEFICIENCY, FRACTURES): VITD: 36.65 ng/mL (ref 30.00–100.00)

## 2020-08-13 LAB — BASIC METABOLIC PANEL
BUN: 21 mg/dL (ref 6–23)
CO2: 27 mEq/L (ref 19–32)
Calcium: 8.5 mg/dL (ref 8.4–10.5)
Chloride: 102 mEq/L (ref 96–112)
Creatinine, Ser: 0.92 mg/dL (ref 0.40–1.50)
GFR: 81.28 mL/min (ref >60.00)
Glucose, Bld: 90 mg/dL (ref 70–99)
Potassium: 4.5 mEq/L (ref 3.5–5.1)
Sodium: 138 mEq/L (ref 135–145)

## 2020-08-13 LAB — POCT URINALYSIS DIPSTICK
Bilirubin, UA: NEGATIVE
Blood, UA: POSITIVE
Glucose, UA: NEGATIVE
Ketones, UA: NEGATIVE
Leukocytes, UA: NEGATIVE
Nitrite, UA: NEGATIVE
Protein, UA: POSITIVE — AB
Spec Grav, UA: 1.02 (ref 1.010–1.025)
Urobilinogen, UA: 0.2 E.U./dL
pH, UA: 5 (ref 5.0–8.0)

## 2020-08-13 LAB — CBC WITH DIFFERENTIAL/PLATELET
Basophils Absolute: 0.1 10*3/uL (ref 0.0–0.1)
Basophils Relative: 0.6 % (ref 0.0–3.0)
Eosinophils Absolute: 0 10*3/uL (ref 0.0–0.7)
Eosinophils Relative: 0.1 % (ref 0.0–5.0)
HCT: 28.6 % — ABNORMAL LOW (ref 39.0–52.0)
Hemoglobin: 9.7 g/dL — ABNORMAL LOW (ref 13.0–17.0)
Lymphocytes Relative: 10.9 % — ABNORMAL LOW (ref 12.0–46.0)
Lymphs Abs: 1 10*3/uL (ref 0.7–4.0)
MCHC: 33.8 g/dL (ref 30.0–36.0)
MCV: 93.2 fl (ref 78.0–100.0)
Monocytes Absolute: 0.6 10*3/uL (ref 0.1–1.0)
Monocytes Relative: 6.8 % (ref 3.0–12.0)
Neutro Abs: 7.2 10*3/uL (ref 1.4–7.7)
Neutrophils Relative %: 81.6 % — ABNORMAL HIGH (ref 43.0–77.0)
Platelets: 324 10*3/uL (ref 150.0–400.0)
RBC: 3.06 Mil/uL — ABNORMAL LOW (ref 4.22–5.81)
RDW: 12.9 % (ref 11.5–15.5)
WBC: 8.8 10*3/uL (ref 4.0–10.5)

## 2020-08-13 LAB — LIPID PANEL
Cholesterol: 170 mg/dL (ref 0–200)
HDL: 41.6 mg/dL (ref >39.00)
LDL Cholesterol: 107 mg/dL — ABNORMAL HIGH (ref 0–99)
NonHDL: 128.58
Total CHOL/HDL Ratio: 4
Triglycerides: 106 mg/dL (ref 0.0–149.0)
VLDL: 21.2 mg/dL (ref 0.0–40.0)

## 2020-08-13 LAB — HEPATIC FUNCTION PANEL
ALT: 12 U/L (ref 0–53)
AST: 21 U/L (ref 0–37)
Albumin: 3.7 g/dL (ref 3.5–5.2)
Alkaline Phosphatase: 108 U/L (ref 39–117)
Bilirubin, Direct: 0.1 mg/dL (ref 0.0–0.3)
Total Bilirubin: 0.5 mg/dL (ref 0.2–1.2)
Total Protein: 6.8 g/dL (ref 6.0–8.3)

## 2020-08-13 LAB — TSH: TSH: 1.47 u[IU]/mL (ref 0.35–4.50)

## 2020-08-13 LAB — POCT INR: INR: 6 — AB (ref 2.0–3.0)

## 2020-08-13 LAB — PSA, MEDICARE: PSA: 2.79 ng/ml (ref 0.10–4.00)

## 2020-08-13 MED ORDER — ALPRAZOLAM 0.25 MG PO TABS: 0.2500 mg | ORAL_TABLET | Freq: Three times a day (TID) | ORAL | 0 refills | Status: DC | PRN

## 2020-08-13 NOTE — Patient Instructions (Signed)
Follow up in 6 months to recheck BP and cholesterol We'll notify you of your lab results and make any changes if needed Continue to work on healthy diet and regular exercise as able Call with any questions or concerns Hang in there!!

## 2020-08-13 NOTE — Assessment & Plan Note (Signed)
Pt is not currently on inhalers/medication.  Wife states only 1 doctor has ever mentioned he had COPD, 'we're not going back there'.  Wife states current SOB is due to lung resection and now pleural fluid.

## 2020-08-13 NOTE — Assessment & Plan Note (Signed)
Check labs and replete prn. 

## 2020-08-13 NOTE — Progress Notes (Signed)
   Subjective:    Patient ID: Marcus [name], male    DOB: 18-Apr-1945, 75 y.o.   MRN: [id number]  HPI CPE- UTD on colonoscopy, flu, pneumonia, COVID.  Reviewed past medical, surgical, family and social histories.   Patient Care Team    Relationship Specialty Notifications Start End  [name], MD PCP - General Family Medicine  01/02/20   [name], MD PCP - Cardiology Cardiology  12/25/19   [name], MD Consulting Physician Cardiothoracic Surgery  12/24/19   Curt Bears, MD Consulting Physician Oncology  01/02/20   Collene Gobble, MD Consulting Physician Pulmonary Disease  01/02/20   Carol Ada, MD Consulting Physician Gastroenterology  01/02/20     Health Maintenance  Topic Date Due  . COVID-19 Vaccine (4 - Booster for Moderna series) 08/29/2020 (Originally 07/31/2020)  . TETANUS/TDAP  02/15/2021 (Originally 12/14/1964)  . Hepatitis C Screening  08/13/2021 (Originally 12/14/1962)  . INFLUENZA VACCINE  11/08/2020  . COLONOSCOPY (Pts 45-64yrs Insurance coverage will need to be confirmed)  12/16/2026  . PNA vac Low Risk Adult  Completed  . HPV VACCINES  Aged Out      Review of Systems Patient reports no vision/hearing changes, anorexia, fever ,adenopathy, swallowing issues, chest pain, palpitations, edema, persistant/recurrent cough, hemoptysis, gastrointestinal  bleeding (melena, rectal bleeding), abdominal pain, excessive heart burn, GU symptoms (dysuria, hematuria, voiding/incontinence issues) syncope, focal weakness, memory loss, numbness & tingling, skin/hair/nail changes, depression, anxiety, abnormal bruising/bleeding, musculoskeletal symptoms/signs.   + SOB- recent pleural effusion continues to have SOB.  Has PET scan scheduled next Friday + persistent hoarseness/cough  This visit occurred during the SARS-CoV-2 public health emergency.  Safety protocols were in place, including screening questions prior to the visit, additional usage of staff PPE, and  extensive cleaning of exam room while observing appropriate contact time as indicated for disinfecting solutions.       Objective:   Physical Exam General Appearance:    Alert, cooperative, no distress, appears stated age  Head:    Normocephalic, without obvious abnormality, atraumatic  Eyes:    PERRL, conjunctiva/corneas clear, EOM's intact, fundi    benign, both eyes       Ears:    Normal TM's and external ear canals, both ears  Nose:   Deferred due to COVID  Throat:   Neck:   Supple, symmetrical, trachea midline, no adenopathy;       thyroid:  No enlargement/tenderness/nodules  Back:     Symmetric, no curvature, ROM normal, no CVA tenderness  Lungs:     Clear to auscultation w/ exception of absent breath sounds in R lower lobe  Chest wall:    No tenderness or deformity  Heart:    Regular rate and rhythm, loud aortic click  Abdomen:     Soft, non-tender, bowel sounds active all four quadrants,    no masses, no organomegaly  Genitalia:    Deferred  Rectal:    Extremities:   Extremities normal, atraumatic, no cyanosis or edema  Pulses:   2+ and symmetric all extremities  Skin:   Skin color, texture, turgor normal, no rashes or lesions  Lymph nodes:   Cervical, supraclavicular, and axillary nodes normal  Neurologic:   CNII-XII intact. Normal strength, sensation and reflexes      throughout          Assessment & Plan:

## 2020-08-13 NOTE — Assessment & Plan Note (Signed)
Chronic problem.  Check labs.  Adjust meds prn  

## 2020-08-13 NOTE — Assessment & Plan Note (Signed)
Pt's PE unchanged from previous.  UTD on immunizations, colonoscopy.  Has PET scheduled for next week.  Check labs.  Anticipatory guidance provided.

## 2020-08-15 LAB — URINE CULTURE
MICRO NUMBER:: 11860578
Result:: NO GROWTH
SPECIMEN QUALITY:: ADEQUATE

## 2020-08-16 ENCOUNTER — Other Ambulatory Visit: Payer: Self-pay

## 2020-08-16 ENCOUNTER — Ambulatory Visit (INDEPENDENT_AMBULATORY_CARE_PROVIDER_SITE_OTHER): Payer: Medicare HMO | Admitting: Internal Medicine

## 2020-08-16 ENCOUNTER — Ambulatory Visit (HOSPITAL_COMMUNITY): Payer: Medicare HMO

## 2020-08-16 ENCOUNTER — Other Ambulatory Visit: Payer: Medicare HMO

## 2020-08-16 DIAGNOSIS — R319 Hematuria, unspecified: Secondary | ICD-10-CM

## 2020-08-16 DIAGNOSIS — Z79899 Other long term (current) drug therapy: Secondary | ICD-10-CM

## 2020-08-16 DIAGNOSIS — Z952 Presence of prosthetic heart valve: Secondary | ICD-10-CM | POA: Diagnosis not present

## 2020-08-16 LAB — POCT INR: INR: 3.5 — AB (ref 2.0–3.0)

## 2020-08-18 ENCOUNTER — Ambulatory Visit: Payer: Medicare HMO | Admitting: Internal Medicine

## 2020-08-20 ENCOUNTER — Ambulatory Visit (HOSPITAL_COMMUNITY)
Admission: RE | Admit: 2020-08-20 | Discharge: 2020-08-20 | Disposition: A | Discharge status: Home / Self Care | Payer: Medicare HMO | Source: Ambulatory Visit | Attending: Physician Assistant | Admitting: Physician Assistant

## 2020-08-20 ENCOUNTER — Other Ambulatory Visit: Payer: Medicare HMO

## 2020-08-20 ENCOUNTER — Other Ambulatory Visit: Payer: Self-pay

## 2020-08-20 ENCOUNTER — Other Ambulatory Visit (INDEPENDENT_AMBULATORY_CARE_PROVIDER_SITE_OTHER): Payer: Medicare HMO

## 2020-08-20 DIAGNOSIS — C3491 Malignant neoplasm of unspecified part of right bronchus or lung: Secondary | ICD-10-CM | POA: Diagnosis not present

## 2020-08-20 DIAGNOSIS — R319 Hematuria, unspecified: Secondary | ICD-10-CM

## 2020-08-20 DIAGNOSIS — I700 Atherosclerosis of aorta: Secondary | ICD-10-CM | POA: Insufficient documentation

## 2020-08-20 DIAGNOSIS — C3490 Malignant neoplasm of unspecified part of unspecified bronchus or lung: Secondary | ICD-10-CM | POA: Diagnosis not present

## 2020-08-20 LAB — URINALYSIS
Bilirubin Urine: NEGATIVE
Ketones, ur: NEGATIVE
Leukocytes,Ua: NEGATIVE
Nitrite: NEGATIVE
Specific Gravity, Urine: 1.02 (ref 1.000–1.030)
Total Protein, Urine: NEGATIVE
Urine Glucose: NEGATIVE
Urobilinogen, UA: 0.2 (ref 0.0–1.0)
pH: 5.5 (ref 5.0–8.0)

## 2020-08-20 LAB — GLUCOSE, CAPILLARY: Glucose-Capillary: 90 mg/dL (ref 70–99)

## 2020-08-20 MED ORDER — FLUDEOXYGLUCOSE F - 18 (FDG) INJECTION
9.2000 | Freq: Once | INTRAVENOUS | Status: AC | PRN
  Administered 2020-08-20: 8.6 via INTRAVENOUS

## 2020-08-20 NOTE — Addendum Note (Signed)
Addended by: Octavio Manns E on: 08/20/2020 09:56 AM   Modules accepted: Orders

## 2020-08-22 LAB — POCT INR: INR: 4.4 — AB (ref 2.0–3.0)

## 2020-08-22 NOTE — Progress Notes (Signed)
[facility] OFFICE PROGRESS NOTE  [name], MD [address] [place] [address]  DIAGNOSIS: Recurrent non-small cell lung cancer, adenocarcinoma initially diagnosed as stage IIIb (T3, N2, M0) non-small cell lung cancer, adenocarcinoma presented with right upper lobe lung mass in addition to right hilar and mediastinal lymphadenopathy. Diagnosed inAugust 30, 2021. In April 2022, he had signs of disease recurrence with extensive pleural involvement, likely extending into extrapleural fat, in the RIGHT chest compatible with disease recurrence and associated with nodal disease in the mediastinum. 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 Equivocal uptake in the LEFT adrenal gland.   Molecular Studies: Negative for actionable mutations  PDL1: 1-49%  PRIOR THERAPY:  1) Right upper lobectomy with lymph node sampling under the care of Dr. Kipp Brood on December 08, 2019. 2)Adjuvant systemic chemotherapy with Cisplatin75 mg/m2and Alimta 500 mg/m2 IV every 3 weeks.Last dose on 04/13/20. Status post4cycles.  CURRENT THERAPY: Palliative systemic chemotherapy with carboplatin for an AUC of 5, Alimta, 500 mg/m2, and Keytruda 200 mg/m2. First dose expected on 09/01/20.   INTERVAL HISTORY: Marcus [name] 75 y.o. male returns to the clinic today forafollow-up visit accompanied by his wife. The patient has a history of stage III non-small cell lung cancer status post resection and adjuvant chemotherapy. He is currently on observation.He has noticed increased shortness of breath and a right posterior/lateral ribpain with inspiration. He had a repeat chest x-ray which showed a new right pleural effusion with overlying atelectasis. The patient presented to the emergency room on 08/01/2020 for this complaint. He had a CT angiogram  performed which showed new loculated pleural effusion on the right lobe with mild soft tissue thickening  which could be concerning for possible disease recurrence. Overall,the patient was well-appearing without evidence of respiratory distress and he was discharged with close follow-up with oncology. Of note, with the original surgical resection of his malignancy in August 2021, there was involvement of the subpleural connective tissue on the pathology report. He was seen in the clinic on 08/02/20. A diagnostic thoracentesis was performed which 5 mL of fluid was drawn. The cytology showed rare atypical cells which are concerning for malignancy.   At his last appointment, he was endorsing pleuritic chest discomfort with inspiration. He describes the chest discomfort as sore in the anterior right rib cage. He denies falls or injuries. He takes 1 tylenol which eases off the pain; however, it reportedly was affecting his INR. Tramadol is too strong for him as well. We tried to give him norco at his last appointment but this was reportedly too strong for him and caused nausea. He also had decreased appetite and weight loss. He also had fatigue. A medrol dosepak was prescribed for this which his wife may have mildly helped. Overall, he feels fatigue, "run down", and short of breath with exertion that requires resting frequently. Of note, his scan noted some uptake in his prostate. The patient recently had a PSA drawn on 08/12/20 by his PCP which showed a PSA of 2.79. Denies dysuria. Today, he denies fever. Denies any recent chills or night sweats. He lost 6 lbs since his last appointment. He denies any hemoptysis. He denies any vomiting, diarrhea, or constipation. He is endorsing a gas like pressure in his chest sometimes. He denies any headache or visual changes. The patient recently had a PET scan performed. He is here today for evaluation and to review his PET scan results.  MEDICAL HISTORY: Past Medical History:  Diagnosis Date  . Atrial fibrillation ([place])   . Dyspnea    with exertion   . Dysrhythmia   .  Hypertension   . Obstructive sleep apnea 10/25/2007   cpap  . S/P AVR    2D ECHO, 04/25/2011 - EF >55%, Right ventricle-mild-moderately dilated  . Swelling of limb    LEA VENOUS, 08/22/2009 - no evidence of deep vein or superficial thrombosis; partially rupturing Baker's Cyst    ALLERGIES:  is allergic to amlodipine, sulfa antibiotics, tetanus toxoids, crestor [rosuvastatin], and penicillins.  MEDICATIONS:  Current Outpatient Medications  Medication Sig Dispense Refill  . folic acid (FOLVITE) 1 MG tablet Take 1 tablet (1 mg total) by mouth daily. 30 tablet 2  . prochlorperazine (COMPAZINE) 10 MG tablet Take 1 tablet (10 mg total) by mouth every 6 (six) hours as needed. 30 tablet 2  . acetaminophen (TYLENOL) 325 MG tablet Take 2 tablets (650 mg total) by mouth every 6 (six) hours as needed for mild pain.    Marland Kitchen ALPRAZolam (XANAX) 0.25 MG tablet Take 1 tablet (0.25 mg total) by mouth every 8 (eight) hours as needed for up to 20 doses for anxiety. 20 tablet 0  . aspirin EC 81 MG tablet Take 81 mg by mouth daily.    Marland Kitchen atorvastatin (LIPITOR) 80 MG tablet TAKE 1 TABLET BY MOUTH EVERY DAY (Patient taking differently: Take 80 mg by mouth daily.) 90 tablet 2  . carboxymethylcellulose (REFRESH PLUS) 0.5 % SOLN Place 1 drop into both eyes daily as needed (dry eyes).     . cetirizine (ZYRTEC) 10 MG tablet Take 10 mg by mouth daily.    . fexofenadine (ALLEGRA) 180 MG tablet Take 180 mg by mouth every evening.    . methylPREDNISolone (MEDROL DOSEPAK) 4 MG TBPK tablet Use as instructed 21 tablet 0  . Multiple Vitamins-Minerals (EYE SUPPORT PO) Take 1 tablet by mouth daily.    . NONFORMULARY OR COMPOUNDED ITEM Apply 2 sprays topically as directed. Testosterone Topical  Preparation 100 mg (Compounded by Maurice)  Apply 2 clicks (50 mg) daily    . ramipril (ALTACE) 10 MG capsule TAKE 1 CAPSULE BY MOUTH EVERY DAY 90 capsule 3  . warfarin (COUMADIN) 10 MG tablet TAKE 1 TO 1 AND 1/2  TABLETS DAILY AS DIRECTED BY COUMADIN CLINIC (Patient taking differently: Take 10-15 mg by mouth daily at 4 PM. 10 mg everyday except Tuesday & Thursday patient takes 15 mg) 135 tablet 1   No current facility-administered medications for this visit.    SURGICAL HISTORY:  Past Surgical History:  Procedure Laterality Date  . CARDIAC CATHETERIZATION Bilateral 05/10/2007   Significant 1-vessel disease, severely dilated aortic root with moderate severe aortic insufficiency  . CARDIAC SURGERY    . CARDIOVERSION  08/02/2007   150 Joule biphasic shock with restoration of sinus rhythm. Heart rate 60.  . cataract surgery     . COLONOSCOPY WITH PROPOFOL N/A 12/15/2016   Procedure: COLONOSCOPY WITH PROPOFOL;  Surgeon: Carol Ada, MD;  Location: [facility];  Service: Endoscopy;  Laterality: N/A;  . EYE SURGERY    . INTERCOSTAL NERVE BLOCK Right 12/08/2019   Procedure: INTERCOSTAL NERVE BLOCK;  Surgeon: [name], MD;  Location: [facility];  Service: Thoracic;  Laterality: Right;  . IR THORACENTESIS ASP PLEURAL SPACE W/IMG GUIDE  08/05/2020  . NM MYOVIEW LTD  04/08/2007   No evidence of inducible myocardial ischemia  . NODE DISSECTION Right 12/08/2019  Procedure: NODE DISSECTION;  Surgeon: [name], MD;  Location: [facility];  Service: Thoracic;  Laterality: Right;  . THYROIDECTOMY, PARTIAL    . torn meniscus in right knee surgery       REVIEW OF SYSTEMS:   Review of Systems  Constitutional: Positive for fatigue, generalized weakness, decreased appetite, and weight loss. Negative for chills and fever. HENT: Negative for mouth sores, nosebleeds, sore throat and trouble swallowing.   Eyes: Negative for eye problems and icterus.  Respiratory: Positive for right sided chest soreness and pleuritic chest pain. Positive for cough. Negative for hemoptysis and wheezing.   Cardiovascular: Negative for leg swelling.  Gastrointestinal: Negative for abdominal pain, constipation, diarrhea, nausea  (with pain medication. None at this time) and vomiting.  Genitourinary: Negative for bladder incontinence, difficulty urinating, dysuria, frequency and hematuria.   Musculoskeletal: Positive for intermittent right shoulder pain. Negative for gait problem, neck pain and neck stiffness.  Skin: Negative for itching and rash.  Neurological: Negative for dizziness, extremity weakness, gait problem, headaches, light-headedness and seizures.  Hematological: Negative for adenopathy. Does not bruise/bleed easily.  Psychiatric/Behavioral: Negative for confusion, depression and sleep disturbance. The patient is not nervous/anxious.   PHYSICAL EXAMINATION:  Blood pressure 108/60, pulse 77, temperature 98 F (36.7 C), temperature source Oral, resp. rate 18, weight 171 lb 12.8 oz (77.9 kg), SpO2 100 %.  ECOG PERFORMANCE STATUS: 2 - Symptomatic, <50% confined to bed  Physical Exam  Constitutional: Oriented to person, place, and time and well-developed, well-nourished, and in no distress.  HENT:  Head: Normocephalic and atraumatic.  Mouth/Throat: Oropharynx is clear and moist. No oropharyngeal exudate.  Eyes: Conjunctivae are normal. Right eye exhibits no discharge. Left eye exhibits no discharge. No scleral icterus.  Neck: Normal range of motion. Neck supple.  Cardiovascular: Normal rate, regular rhythm, normal heart sounds and intact distal pulses.   Pulmonary/Chest: Effort normal and breath sounds normal. No respiratory distress. No wheezes. No rales.  Abdominal: Soft. Bowel sounds are normal. Exhibits no distension and no mass. There is no tenderness.  Musculoskeletal: Normal range of motion. Exhibits no edema.  Lymphadenopathy:    No cervical adenopathy.  Neurological: Alert and oriented to person, place, and time. Exhibits normal muscle tone. Gait normal. Coordination normal.  Skin: Skin is warm and dry. No rash noted. Not diaphoretic. No erythema. No pallor.  Psychiatric: Mood, memory and  judgment normal.  Vitals reviewed.  LABORATORY DATA: Lab Results  Component Value Date   WBC 8.8 08/13/2020   HGB 9.7 (L) 08/13/2020   HCT 28.6 (L) 08/13/2020   MCV 93.2 08/13/2020   PLT 324.0 08/13/2020      Chemistry      Component Value Date/Time   NA 138 08/13/2020 0951   NA 139 10/28/2019 1057   K 4.5 08/13/2020 0951   CL 102 08/13/2020 0951   CO2 27 08/13/2020 0951   BUN 21 08/13/2020 0951   BUN 10 10/28/2019 1057   CREATININE 0.92 08/13/2020 0951   CREATININE 0.94 05/14/2020 1336      Component Value Date/Time   CALCIUM 8.5 08/13/2020 0951   ALKPHOS 108 08/13/2020 0951   AST 21 08/13/2020 0951   AST 28 05/14/2020 1336   ALT 12 08/13/2020 0951   ALT 15 05/14/2020 1336   BILITOT 0.5 08/13/2020 0951   BILITOT 0.6 05/14/2020 1336       RADIOGRAPHIC STUDIES:  DG Chest 1 View  Result Date: 08/05/2020 CLINICAL DATA:  History of adenocarcinoma of the RIGHT  lung found to have increasing pleural fluid. EXAM: CHEST  1 VIEW COMPARISON:  Prior chest CTs most recent of August 01, 2020 FINDINGS: Post median sternotomy for coronary revascularization. Post partial lung resection as before. Loculated pleural fluid along the RIGHT lateral chest and above the RIGHT hemidiaphragm tracking into the fissural reflections in the RIGHT chest showing similar configuration when compared to recent imaging. Volume grossly similar to the prior study. Paramediastinal density along the RIGHT superior mediastinum with similar appearance likely reflecting post treatment changes. No pneumothorax. LEFT lung is clear. On limited assessment no acute skeletal process. IMPRESSION: 1. Loculated pleural fluid along the RIGHT lateral chest and above the RIGHT hemidiaphragm tracking into the fissural reflections in the RIGHT chest showing similar configuration when compared to recent imaging. Perhaps slightly decreased as compared to scout image from recent CT along the lateral chest. No pneumothorax visualized.  Electronically Signed   By: Zetta Bills M.D.   On: 08/05/2020 12:02   CT Angio Chest PE W and/or Wo Contrast  Result Date: 08/01/2020 CLINICAL DATA:  Evaluate for acute pulmonary embolus. History of lung cancer. Now with shortness of breath EXAM: CT ANGIOGRAPHY CHEST CT ABDOMEN AND PELVIS WITH CONTRAST TECHNIQUE: Multidetector CT imaging of the chest was performed using the standard protocol during bolus administration of intravenous contrast. Multiplanar CT image reconstructions and MIPs were obtained to evaluate the vascular anatomy. Multidetector CT imaging of the abdomen and pelvis was performed using the standard protocol during bolus administration of intravenous contrast. CONTRAST:  19m OMNIPAQUE IOHEXOL 350 MG/ML SOLN COMPARISON:  05/14/2020 FINDINGS: CTA CHEST FINDINGS Cardiovascular: Satisfactory opacification of the pulmonary arteries to the segmental level. No evidence of pulmonary embolism. Normal heart size. No pericardial effusion. Aortic atherosclerosis. Previous CABG procedure. Mediastinum/Nodes: No enlarged mediastinal, hilar, or axillary lymph nodes. Thyroid gland, trachea, and esophagus demonstrate no significant findings. Lungs/Pleura: There is a moderate loculated right pleural effusion, new from previous exam. Additionally, there is mild pleural soft tissue thickening overlying the apical portions of the right lung which measures 4 mm in thickness, also new when compared with the previous exam. No focal parenchymal nodule or mass identified. Mild changes of emphysema. Musculoskeletal: No chest wall abnormality. No acute or significant osseous findings. Review of the MIP images confirms the above findings. CT ABDOMEN and PELVIS FINDINGS Hepatobiliary: Normal appearance of the liver. No suspicious liver abnormality. Small gallstone noted within the dependent portion of the gallbladder. No gallbladder wall thickening or pericholecystic fluid. Pancreas: Unremarkable. No pancreatic ductal  dilatation or surrounding inflammatory changes. Spleen: Normal in size without focal abnormality. Adrenals/Urinary Tract: The adrenal glands appear normal. Bilateral kidney cysts. The largest is in the posterolateral cortex of the right mid kidney measuring 2.7 cm. Urinary bladder is unremarkable. Stomach/Bowel: Small hiatal hernia. The appendix is visualized and appears normal. Colonic scratch set sigmoid diverticulosis noted without signs of acute inflammation. No bowel wall thickening, inflammation or distension. Vascular/Lymphatic: Aortic atherosclerosis. No enlarged abdominal or pelvic lymph nodes. Reproductive: Prostate is unremarkable. Other: No free fluid or fluid collections. Small fat containing umbilical hernia is noted. Musculoskeletal: No acute or significant osseous findings. Review of the MIP images confirms the above findings. IMPRESSION: 1. No evidence for acute pulmonary embolus. 2. New moderate loculated right pleural effusion with mild pleural soft tissue thickening overlying the apical portions of the right lung. Etiology is indeterminate. If there is a clinical concern for pleural spread of disease consider further evaluation with diagnostic thoracentesis. 3. No acute findings within the  abdomen or pelvis. 4. Emphysema and aortic atherosclerosis. Aortic Atherosclerosis (ICD10-I70.0) and Emphysema (ICD10-J43.9). Electronically Signed   By: Kerby Moors M.D.   On: 08/01/2020 13:30   CT ABDOMEN PELVIS W CONTRAST  Result Date: 08/01/2020 CLINICAL DATA:  Evaluate for acute pulmonary embolus. History of lung cancer. Now with shortness of breath EXAM: CT ANGIOGRAPHY CHEST CT ABDOMEN AND PELVIS WITH CONTRAST TECHNIQUE: Multidetector CT imaging of the chest was performed using the standard protocol during bolus administration of intravenous contrast. Multiplanar CT image reconstructions and MIPs were obtained to evaluate the vascular anatomy. Multidetector CT imaging of the abdomen and pelvis was  performed using the standard protocol during bolus administration of intravenous contrast. CONTRAST:  178m OMNIPAQUE IOHEXOL 350 MG/ML SOLN COMPARISON:  05/14/2020 FINDINGS: CTA CHEST FINDINGS Cardiovascular: Satisfactory opacification of the pulmonary arteries to the segmental level. No evidence of pulmonary embolism. Normal heart size. No pericardial effusion. Aortic atherosclerosis. Previous CABG procedure. Mediastinum/Nodes: No enlarged mediastinal, hilar, or axillary lymph nodes. Thyroid gland, trachea, and esophagus demonstrate no significant findings. Lungs/Pleura: There is a moderate loculated right pleural effusion, new from previous exam. Additionally, there is mild pleural soft tissue thickening overlying the apical portions of the right lung which measures 4 mm in thickness, also new when compared with the previous exam. No focal parenchymal nodule or mass identified. Mild changes of emphysema. Musculoskeletal: No chest wall abnormality. No acute or significant osseous findings. Review of the MIP images confirms the above findings. CT ABDOMEN and PELVIS FINDINGS Hepatobiliary: Normal appearance of the liver. No suspicious liver abnormality. Small gallstone noted within the dependent portion of the gallbladder. No gallbladder wall thickening or pericholecystic fluid. Pancreas: Unremarkable. No pancreatic ductal dilatation or surrounding inflammatory changes. Spleen: Normal in size without focal abnormality. Adrenals/Urinary Tract: The adrenal glands appear normal. Bilateral kidney cysts. The largest is in the posterolateral cortex of the right mid kidney measuring 2.7 cm. Urinary bladder is unremarkable. Stomach/Bowel: Small hiatal hernia. The appendix is visualized and appears normal. Colonic scratch set sigmoid diverticulosis noted without signs of acute inflammation. No bowel wall thickening, inflammation or distension. Vascular/Lymphatic: Aortic atherosclerosis. No enlarged abdominal or pelvic lymph  nodes. Reproductive: Prostate is unremarkable. Other: No free fluid or fluid collections. Small fat containing umbilical hernia is noted. Musculoskeletal: No acute or significant osseous findings. Review of the MIP images confirms the above findings. IMPRESSION: 1. No evidence for acute pulmonary embolus. 2. New moderate loculated right pleural effusion with mild pleural soft tissue thickening overlying the apical portions of the right lung. Etiology is indeterminate. If there is a clinical concern for pleural spread of disease consider further evaluation with diagnostic thoracentesis. 3. No acute findings within the abdomen or pelvis. 4. Emphysema and aortic atherosclerosis. Aortic Atherosclerosis (ICD10-I70.0) and Emphysema (ICD10-J43.9). Electronically Signed   By: TKerby MoorsM.D.   On: 08/01/2020 13:30   NM PET Image Restag (PS) Skull Base To Thigh  Result Date: 08/21/2020 CLINICAL DATA:  Subsequent treatment strategy for non-small cell lung cancer, assess treatment response in the setting of new pleural fluid/nodularity. EXAM: NUCLEAR MEDICINE PET SKULL BASE TO THIGH TECHNIQUE: 8.6 mCi F-18 FDG was injected intravenously. Full-ring PET imaging was performed from the skull base to thigh after the radiotracer. CT data was obtained and used for attenuation correction and anatomic localization. Fasting blood glucose: 90 mg/dl COMPARISON:  Multiple prior studies most recent from April of 2022 and PET exams dating back to January of 2021. FINDINGS: Mediastinal blood pool activity: SUV max 2.76 Liver  activity: SUV max NA NECK: No hypermetabolic lymph nodes in the neck. Incidental CT findings: none CHEST: Near circumferential pleural thickening in the chest with signs of nodularity, most pronounced on image 100 of series 4, this area measuring 7.7 x 2.3 cm with a maximum SUV of 6.6. Subtle pleural thickening elsewhere with increased metabolic activity, for instance on image 55 of series 4 maximum SUV of 8.6  with respect to pleural thickening at the lung apex. Increased pleural thickening adjacent to previous mediastinal activity that is since been resected measuring 7 mm on image 73 of series 4 with a maximum SUV of 6.2. Intense FDG uptake tracks along the RIGHT lateral and anterior inferior chest into the cardiophrenic recess and along the RIGHT hemidiaphragm. Maximum SUV just lateral to the RIGHT cardiophrenic recess 8.4. Thickening near the distal esophagus with infiltration of the fat also showing increased metabolic activity. (Image 82/4) 11 mm subcarinal lymph node with a maximum SUV of 5.7. Mildly enlarged RIGHT paratracheal lymph node (image 74/4) mild FDG uptake measuring 8 mm short axis with a maximum SUV of 4.1. Incidental CT findings: Post median sternotomy. Signs of aortic valve replacement and CABG. Normal heart size without substantial pericardial effusion. Normal caliber central pulmonary vasculature. Limited assessment of cardiovascular structures given lack of intravenous contrast. Septal thickening, irregular septal thickening at the RIGHT lung apex with changes of RIGHT upper lobectomy as before. Septal thickening is a new finding based on comparison with imaging from February. No dense consolidation. Airways are patent. ABDOMEN/PELVIS: Disease tracks into the most inferior aspect of the costodiaphragmatic recess, no definite signs of disease however noted in the abdomen. Minimal thickening of the LEFT adrenal gland appears similar to prior imaging. Mildly increased FDG uptake in this area is similar to background liver, only slightly above background liver with a maximum SUV of 3.6 Incidental CT findings: No acute findings relative to liver, gallbladder, spleen, pancreas, adrenal glands and kidneys. Small cyst in the interpolar RIGHT kidney. Urinary bladder decompressed. No acute gastrointestinal finding.  Appendix is normal. Extensive aortic atheromatous plaque. Dilated iliac vasculature with  similar appearance, aneurysmal caliber of the LEFT common iliac artery at the internal external bifurcation 2.6 cm similar to previous imaging. Prostate unremarkable by CT though with focal area of FDG uptake in the posterolateral LEFT hemi prostate with an SUV of 5.4 in the approximate location of the mid gland (image 193/3) SKELETON: No definite signs of skeletal metastatic disease though chest wall involvement on the RIGHT is suspected and areas of FDG uptake are closely associated with adjacent ribs. Incidental CT findings: There is involvement however likely of extrapleural fat in the RIGHT chest due to circumferential pleural involvement. IMPRESSION: Signs of extensive pleural involvement, likely extending into extrapleural fat, in the RIGHT chest compatible with disease recurrence and associated with nodal disease in the mediastinum. 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 Decreased pleural fluid compared to August 01, 2020 following thoracentesis. Equivocal uptake in the LEFT adrenal gland, attention on follow-up. Adrenal thickening is unchanged dating back to 2021 and is only very mild. Focal uptake in the prostate. Correlation with PSA may be helpful. Findings are nonspecific and could reflect prostatitis but could also be seen in the setting of prostate cancer. Aortic atherosclerosis with aneurysmal dilation of the iliac vasculature similar to prior imaging. Aortic Atherosclerosis (ICD10-I70.0). Electronically Signed   By: Zetta Bills M.D.   On: 08/21/2020 09:32   DG Chest Portable 1 View  Result Date: 08/01/2020 CLINICAL  DATA:  Worsening shortness of breath over the past week. History of right lung cancer status post right upper lobe lobectomy. EXAM: PORTABLE CHEST 1 VIEW COMPARISON:  Chest CT 05/14/2020 and chest x-ray 01/23/2020 FINDINGS: The heart is normal in size and stable. The mediastinal and hilar contours are within normal limits and unchanged. Stable surgical  changes from aortic valve replacement surgery. New right, possibly loculated, pleural effusion and overlying atelectasis. This was not present on a recent chest CT from 05/14/2020. Chest CT may be helpful for further evaluation. The left lung is clear. No edema or infiltrates. The bony thorax is intact. IMPRESSION: New right pleural effusion and overlying atelectasis. Electronically Signed   By: Marijo Sanes M.D.   On: 08/01/2020 12:11   IR THORACENTESIS ASP PLEURAL SPACE W/IMG GUIDE  Result Date: 08/05/2020 INDICATION: History of right-sided lung cancer, previously treated. Now found to have loculated right pleural effusion. Request diagnostic and possibly therapeutic thoracentesis. EXAM: ULTRASOUND GUIDED RIGHT THORACENTESIS MEDICATIONS: 1% plain lidocaine, 5 mL COMPLICATIONS: None immediate. PROCEDURE: An ultrasound guided thoracentesis was thoroughly discussed with the patient and questions answered. The benefits, risks, alternatives and complications were also discussed. The patient understands and wishes to proceed with the procedure. Written consent was obtained. Ultrasound of the right chest demonstrates severely loculated small right pleural effusion. Multiple septations noted. Largest pocket chosen for attempt at thoracentesis. Ultrasound was performed to localize and mark an adequate pocket of fluid in the right chest. The area was then prepped and draped in the normal sterile fashion. 1% Lidocaine was used for local anesthesia. Under ultrasound guidance a 6 Fr Safe-T-Centesis catheter was introduced. Thoracentesis was performed. The catheter was removed and a dressing applied. FINDINGS: A total of approximately only 5 mL of hazy, serosanguineous fluid was removed. Samples were sent to the laboratory as requested by the clinical team. IMPRESSION: Successful ultrasound guided right thoracentesis yielding 5 mL of pleural fluid. Read by: Ascencion Dike PA-C Electronically Signed   By: Jacqulynn Cadet  M.D.   On: 08/05/2020 12:15     ASSESSMENT/PLAN:  This is a very pleasant 75 year old Caucasian male with recurrent lung cancer, initially diagnosed with stage IIIb non-small cell lung cancer, adenocarcinoma. The patient presented with a right upper lobe lung mass in addition to right hilar and mediastinal lymphadenopathy. He was diagnosed in August 2021. He does not have any actionable mutations.HisPD-L1 expression is estimated to be in the range of 1 to 49%. He had evidence of disease recurrence in April 2022 with extensive pleural involvement, likely extending into extrapleural fat, in the RIGHT chest compatible with disease recurrence and associated with nodal disease in the mediastinum. 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 Equivocal uptake in the LEFT adrenal gland.  The patient declined enrollment in the alliance clinical trial for treatment with systemic chemotherapy plus or minus immunotherapy.  Heunderwentadjuvant systemic chemotherapy with cisplatin 75 mg/M2 and Alimta 500 mg/M2 every 3 weeks. Status post 4cycles.He tolerated his treatment fairly well with no concerning adverse effects.  The patient has been on observation and feeling fine until he recently presented to the emergency room for the chief complaint of pleuritic right-sided chest pain. CT angiogram showed newmoderate loculated right pleural effusion with mild pleural soft tissue thickening overlying the apical portions of the right lung.   He had a diagnostic thoracentesis which showed rare atypical cells concerning for malignancy.   He recently had a PET scan performed. Dr. Julien Nordmann personally and independently reviewed the scan and discussed  the results with the patient. Unfortunately, the patient's scan showed signs of recurrent and metastatic disease with extensive pleural involvement.    Dr. Julien Nordmann had a lengthly discussion with the patient today about his current condition and  treatment options. Dr. Julien Nordmann recommends that we perform Guardant 360 testing to see if he is a candidate for targeted oral treatment. We will arrange to have this performed today. Dr. Julien Nordmann would also like tissue diagnosis to confirm recurrence. Therefore, we will arrange for a urgent referral to IR for a CT guided biopsy of the right lung pleural lesion to confirm the diagnosis.   If he is negative for targeted treatment, then his treatment would consist of palliative systemic chemotherapy with carboplatin for an AUC of 5, Alimta 500 mg/m, and Keytruda 200 mg IV every 3 weeks.  We will tentatively plan to start this next week on 09/01/20. The patient is in agreement with this plan.   We discussed the adverse side effects of treatment including but not limited to alopecia, myelosuppression, nausea and vomiting, peripheral neuropathy, liver or renal dysfunction as well as immunotherapy mediated adverse effects.   We will arrange for the patient to have a B12 injection while in the clinic today.     I sent prescriptions for 1 mg folic acid p.o. daily as well as Compazine 10 mg every 6 hours as needed for nausea.   The patient will follow-up in 1 week for evaluation and to review the pathology results before starting his first cycle of treatment.   Since he has evidence for disease recurrence, Dr. Julien Nordmann recommends arranging for a restaging brain MRI.   For the focal area of uptake in the prostate, he recently had a PSA was 2.7. Dr. Julien Nordmann believes pursing an invasive workup at this time does not take priority and or significantly impact his prognosis since he now has likely stage IV lung cancer.   For his back pain, he will continue to take tylenol since he is intolerant to most pain medications. Discussed he can also use lidocaine patches if needed.   The patient notes he is a hard stick. I discussed that he may benefit from a port-a-cath. I recommended we revisit this next week once we have  the results of the guardant 360. Once we confirm that he is going to have intravenous chemotherapy, then I can go ahead and move forward making arrangements for a port-a-cath.   The patient was advised to call immediately if he has any concerning symptoms in the interval. The patient voices understanding of current disease status and treatment options and is in agreement with the current care plan. All questions were answered. The patient knows to call the clinic with any problems, questions or concerns. We can certainly see the patient much sooner if necessary      Orders Placed This Encounter  Procedures  . CT Biopsy    Standing Status:   Future    Standing Expiration Date:   08/25/2021    Order Specific Question:   Lab orders requested (DO NOT place separate lab orders, these will be automatically ordered during procedure specimen collection):    Answer:   Surgical Pathology    Order Specific Question:   Reason for Exam (SYMPTOM  OR DIAGNOSIS REQUIRED)    Answer:   PLease biopsy the right pleural disease which is suspicious for cancer recurrence    Order Specific Question:   Preferred location?    Answer:   [facility]  .  MR Brain W Wo Contrast    Standing Status:   Future    Standing Expiration Date:   08/25/2021    Order Specific Question:   If indicated for the ordered procedure, I authorize the administration of contrast media per Radiology protocol    Answer:   Yes    Order Specific Question:   What is the patient's sedation requirement?    Answer:   No Sedation    Order Specific Question:   Does the patient have a pacemaker or implanted devices?    Answer:   No    Order Specific Question:   Use SRS Protocol?    Answer:   No    Order Specific Question:   Preferred imaging location?    Answer:   [facility] (table limit - 550 lbs)  . Guardant 360    Standing Status:   Future    Number of Occurrences:   1    Standing Expiration Date:   08/25/2021  . CBC with  Differential (Cancer Center Only)    Standing Status:   Standing    Number of Occurrences:   12    Standing Expiration Date:   08/25/2021  . CMP ([place] only)    Standing Status:   Standing    Number of Occurrences:   12    Standing Expiration Date:   08/25/2021  . TSH    Standing Status:   Standing    Number of Occurrences:   4    Standing Expiration Date:   08/25/2021      Arlin Sass L Younes Degeorge, PA-C 08/25/20  ADDENDUM: Hematology/Oncology Attending: I had a face-to-face encounter with the patient today.  I reviewed his lab, scan and recommended his care plan.  This is a very pleasant 75 years old white male with recurrent non-small cell lung cancer that was initially diagnosed as a stage IIIb non-small cell lung cancer, adenocarcinoma presented with right upper lobe lung mass in addition to right hilar and mediastinal lymphadenopathy diagnosed in August 2021 status post right upper lobectomy with lymph node sampling under the care of Dr. Kipp Brood on August 30,2021 followed by adjuvant systemic chemotherapy with cisplatin and Alimta for 4 cycles.  The patient has no actionable mutations and PD-L1 expression was in the range of 1-49%. Unfortunately the patient had evidence for disease recurrence with malignant pleural effusion as well as suspicious pleural-based metastasis.  He underwent a PET scan recently.  I personally and independently reviewed the PET scan and showed the images to the patient and his wife. I recommended for the patient to have repeat biopsy of one of the pleural lesions for confirmation of his tissue diagnosis. I will send the blood test to [facility] for molecular studies to identify any other mutation that was not detected on the previous work which was done mainly for EGFR and ALK. I also discussed with the patient his systemic treatment options and he was giving the option of palliative care and hospice referral versus consideration of palliative systemic  chemotherapy with carboplatin for AUC of 5, Alimta 500 Mg/M2 and Keytruda 200 Mg IV every 3 weeks for 4 cycles followed by maintenance treatment with Alimta and Keytruda if the patient has no evidence for disease progression after cycle #4. The patient and his wife are interested in the treatment. He will receive vitamin B12 injection today, we will send the prescription for folic acid and Compazine to his pharmacy. He is expected to start the  first cycle of this treatment next week. The patient will come back for follow-up visit next week. We will also order MRI of the brain to rule out brain metastasis especially with the recent disease recurrence. The patient and his wife agreed to the current plan.  For the pain on the right side of the chest he declined taking any narcotic medication.  We advised him to continue his Tylenol in addition to lidocaine patch. He was advised to call immediately if he has any other concerning symptoms in the interval.  The total time spent in the appointment was 55 minutes. Disclaimer: This note was dictated with voice recognition software. Similar sounding words can inadvertently be transcribed and may be missed upon review. Eilleen Kempf, MD 08/25/20

## 2020-08-23 ENCOUNTER — Ambulatory Visit (INDEPENDENT_AMBULATORY_CARE_PROVIDER_SITE_OTHER): Payer: Medicare HMO | Admitting: Cardiology

## 2020-08-23 DIAGNOSIS — Z952 Presence of prosthetic heart valve: Secondary | ICD-10-CM

## 2020-08-23 DIAGNOSIS — Z79899 Other long term (current) drug therapy: Secondary | ICD-10-CM

## 2020-08-24 ENCOUNTER — Ambulatory Visit: Payer: Medicare HMO | Admitting: Internal Medicine

## 2020-08-25 ENCOUNTER — Other Ambulatory Visit: Payer: Self-pay

## 2020-08-25 ENCOUNTER — Other Ambulatory Visit: Payer: Self-pay | Admitting: Internal Medicine

## 2020-08-25 ENCOUNTER — Encounter: Payer: Self-pay | Admitting: Physician Assistant

## 2020-08-25 ENCOUNTER — Inpatient Hospital Stay (HOSPITAL_BASED_OUTPATIENT_CLINIC_OR_DEPARTMENT_OTHER): Payer: Medicare HMO | Admitting: Physician Assistant

## 2020-08-25 ENCOUNTER — Encounter: Payer: Self-pay | Admitting: Internal Medicine

## 2020-08-25 ENCOUNTER — Inpatient Hospital Stay: Payer: Medicare HMO

## 2020-08-25 VITALS — BP 108/60 | HR 77 | Temp 98.0°F | Resp 18 | Wt 171.8 lb

## 2020-08-25 DIAGNOSIS — C3490 Malignant neoplasm of unspecified part of unspecified bronchus or lung: Secondary | ICD-10-CM | POA: Diagnosis not present

## 2020-08-25 DIAGNOSIS — C3491 Malignant neoplasm of unspecified part of right bronchus or lung: Secondary | ICD-10-CM | POA: Diagnosis not present

## 2020-08-25 DIAGNOSIS — Z5111 Encounter for antineoplastic chemotherapy: Secondary | ICD-10-CM | POA: Diagnosis not present

## 2020-08-25 DIAGNOSIS — C3411 Malignant neoplasm of upper lobe, right bronchus or lung: Secondary | ICD-10-CM | POA: Diagnosis not present

## 2020-08-25 DIAGNOSIS — R5383 Other fatigue: Secondary | ICD-10-CM | POA: Diagnosis not present

## 2020-08-25 DIAGNOSIS — Z7189 Other specified counseling: Secondary | ICD-10-CM | POA: Diagnosis not present

## 2020-08-25 DIAGNOSIS — R630 Anorexia: Secondary | ICD-10-CM | POA: Diagnosis not present

## 2020-08-25 DIAGNOSIS — R0789 Other chest pain: Secondary | ICD-10-CM | POA: Diagnosis not present

## 2020-08-25 DIAGNOSIS — Z902 Acquired absence of lung [part of]: Secondary | ICD-10-CM | POA: Diagnosis not present

## 2020-08-25 DIAGNOSIS — Z5112 Encounter for antineoplastic immunotherapy: Secondary | ICD-10-CM | POA: Diagnosis not present

## 2020-08-25 DIAGNOSIS — Z79899 Other long term (current) drug therapy: Secondary | ICD-10-CM | POA: Diagnosis not present

## 2020-08-25 DIAGNOSIS — J90 Pleural effusion, not elsewhere classified: Secondary | ICD-10-CM | POA: Diagnosis not present

## 2020-08-25 MED ORDER — CYANOCOBALAMIN 1000 MCG/ML IJ SOLN
1000.0000 ug | Freq: Once | INTRAMUSCULAR | Status: AC
  Administered 2020-08-25: 1000 ug via INTRAMUSCULAR

## 2020-08-25 MED ORDER — FOLIC ACID 1 MG PO TABS: 1.0000 mg | ORAL_TABLET | Freq: Every day | ORAL | 2 refills | Status: DC

## 2020-08-25 MED ORDER — PROCHLORPERAZINE MALEATE 10 MG PO TABS: 10.0000 mg | ORAL_TABLET | Freq: Four times a day (QID) | ORAL | 2 refills | Status: AC | PRN

## 2020-08-25 MED ORDER — CYANOCOBALAMIN 1000 MCG/ML IJ SOLN
INTRAMUSCULAR | Status: AC
  Filled 2020-08-25: qty 1

## 2020-08-25 NOTE — Progress Notes (Signed)
DISCONTINUE ON PATHWAY REGIMEN - Non-Small Cell Lung     A cycle is every 21 days:     Pemetrexed      Cisplatin   **Always confirm dose/schedule in your pharmacy ordering system**  REASON: Disease Progression PRIOR TREATMENT: LOS258: Cisplatin + Pemetrexed q3 Weeks x 4 Cycles TREATMENT RESPONSE: N/A - Adjuvant Therapy  START ON PATHWAY REGIMEN - Non-Small Cell Lung     A cycle is every 21 days:     Pembrolizumab      Pemetrexed      Carboplatin   **Always confirm dose/schedule in your pharmacy ordering system**  Patient Characteristics: Stage IV Metastatic, Nonsquamous, Molecular Analysis Completed, Molecular Alteration Present and Targeted Therapy Exhausted OR EGFR Exon 20+ or KRAS G12C+ Present and No Prior Chemo/Immunotherapy OR No Alteration Present, Initial  Chemotherapy/Immunotherapy, PS = 0, 1, No Alteration Present, No Alteration Present, Candidate for Immunotherapy, PD-L1 Expression Positive 1-49% (TPS) / Negative / Not Tested / Awaiting Test Results and Immunotherapy Candidate Therapeutic Status: Stage IV Metastatic Histology: Nonsquamous Cell Broad Molecular Profiling Status: Molecular Analysis Completed Molecular Analysis Results: No Alteration Present ECOG Performance Status: 1 Chemotherapy/Immunotherapy Line of Therapy: Initial Chemotherapy/Immunotherapy EGFR Exons 18-21 Mutation Testing Status: Completed and Negative ALK Fusion/Rearrangement Testing Status: Completed and Negative BRAF V600 Mutation Testing Status: Completed and Negative KRAS G12C Mutation Testing Status: Completed and Negative MET Exon 14 Mutation Testing Status: Completed and Negative RET Fusion/Rearrangement Testing Status: Completed and Negative NTRK Fusion/Rearrangement Testing Status: Completed and Negative ROS1 Fusion/Rearrangement Testing Status: Completed and Negative Immunotherapy Candidate Status: Candidate for Immunotherapy PD-L1 Expression Status: PD-L1 Positive 1-49% (TPS) Intent  of Therapy: Non-Curative / Palliative Intent, Discussed with Patient

## 2020-08-25 NOTE — Patient Instructions (Signed)
Summary:  -There are two main categories of lung cancer, they are named based on the size of the cancer cell. One is called Non-Small cell lung cancer. The other type is Small Cell Lung Cancer -The sample (biopsy) that they took of your tumor was consistent with a subtype of Non-small cell lung cancer called Adenocarcinoma. This is the most common type of lung cancer.  -We covered a lot of important information at your appointment today regarding what the treatment plan is moving forward. Here are the the main points that were discussed at your office visit with [place] today:  -The treatment that you will receive consists of two chemotherapy drugs, called Carboplatin and Alimta (also called Pemetrexed) and one immunotherapy drug called Keytruda (pembrolizumab).  -We are planning on starting your treatment next week on 09/01/20  -Your treatment will be given once every 3 weeks. We will check your labs once a week for the first ~5 treatments just to make sure that important components of your blood are in an acceptable range -We will get a CT scan after 3 treatments to check on the progress of treatment  Medications:  -I have sent a few important medication prescriptions to your pharmacy.  -Compazine was sent to your pharmacy. This medication is for nausea. You may take this every 6 hours as needed if you feel nauseous.  -I have also sent a prescription for 1 mg of folic acid to your pharmacy. We need you to take 1 tablet every day.  -We will administer vitamin B12 every 9 weeks while you are here in the clinic. You have received your first dose today.   Referrals or Imaging: -I will place an order for a brain MRI. Be on the lookout for a phone call from them.    Follow up:  -We will see you back for a follow up visit 1 week after your first treatment to see how it went and help manage any side effects of treatment that you may have   -If you need to reach [place] at any time, the main office number to the  cancer center is [phone], when you call, ask to speak to either Cassie's or Dr. Worthy Flank nurse.

## 2020-08-27 ENCOUNTER — Encounter: Payer: Self-pay | Admitting: Internal Medicine

## 2020-08-27 ENCOUNTER — Telehealth: Payer: Self-pay

## 2020-08-27 NOTE — Telephone Encounter (Signed)
I spoke to pts wife, [name], again today and reiterated our earlier conversation that there is a pending review by the infusion charge RN as to when and where the pt can be scheduled given the request is for next week and they will contact her soon with this information. I also reminded her of our conversation regarding the pt being open to going to [facility] or [facility] for his first infusion. Lastly, I also advised that despite there being a pending response for infusion next week, we still schedule all the subsequent appts. Ms. [name] expressed understanding of this information.

## 2020-08-27 NOTE — Telephone Encounter (Signed)
Pt wife, [name], called wanting to know when pt will be scheduled for his infusion next week. I advised Ms. [name] the requesting is pending review by the infusion charge RN and they will contact her soon. I also asked if in the event we are not able to get the pt scheduled here at [facility], are they okay with going to [facility] or [facility] for his first infusion. She advised they are open to this option if it is necessary. I have advised the scheduling department of this information.

## 2020-08-30 ENCOUNTER — Encounter: Payer: Self-pay | Admitting: Adult Health

## 2020-08-30 ENCOUNTER — Encounter: Payer: Self-pay | Admitting: Internal Medicine

## 2020-08-30 ENCOUNTER — Ambulatory Visit (INDEPENDENT_AMBULATORY_CARE_PROVIDER_SITE_OTHER): Payer: Medicare HMO | Admitting: Cardiology

## 2020-08-30 ENCOUNTER — Ambulatory Visit (INDEPENDENT_AMBULATORY_CARE_PROVIDER_SITE_OTHER): Payer: Medicare HMO

## 2020-08-30 ENCOUNTER — Telehealth: Payer: Self-pay | Admitting: Internal Medicine

## 2020-08-30 ENCOUNTER — Other Ambulatory Visit: Payer: Self-pay

## 2020-08-30 ENCOUNTER — Telehealth: Payer: Self-pay | Admitting: *Deleted

## 2020-08-30 ENCOUNTER — Encounter (HOSPITAL_COMMUNITY): Payer: Self-pay

## 2020-08-30 ENCOUNTER — Ambulatory Visit: Payer: Medicare HMO | Admitting: Adult Health

## 2020-08-30 VITALS — BP 122/66 | HR 71 | Temp 97.4°F | Ht 70.0 in | Wt 169.8 lb

## 2020-08-30 DIAGNOSIS — Z79899 Other long term (current) drug therapy: Secondary | ICD-10-CM | POA: Diagnosis not present

## 2020-08-30 DIAGNOSIS — C3491 Malignant neoplasm of unspecified part of right bronchus or lung: Secondary | ICD-10-CM

## 2020-08-30 DIAGNOSIS — C3490 Malignant neoplasm of unspecified part of unspecified bronchus or lung: Secondary | ICD-10-CM | POA: Diagnosis not present

## 2020-08-30 DIAGNOSIS — Z952 Presence of prosthetic heart valve: Secondary | ICD-10-CM

## 2020-08-30 DIAGNOSIS — R0781 Pleurodynia: Secondary | ICD-10-CM

## 2020-08-30 DIAGNOSIS — R079 Chest pain, unspecified: Secondary | ICD-10-CM

## 2020-08-30 DIAGNOSIS — J929 Pleural plaque without asbestos: Secondary | ICD-10-CM | POA: Diagnosis not present

## 2020-08-30 DIAGNOSIS — R918 Other nonspecific abnormal finding of lung field: Secondary | ICD-10-CM | POA: Diagnosis not present

## 2020-08-30 LAB — POCT INR: INR: 5.9 — AB (ref 2.0–3.0)

## 2020-08-30 NOTE — Patient Instructions (Addendum)
Warm heat to rib/back area Tylenol As needed  Follow up with Oncology for Chemo as planned  Swallow precautions  Activity as tolerated.  Chest xray today  Follow up with Dr. Lamonte Sakai in 3  Months and As needed   Please contact office for sooner follow up if symptoms do not improve or worsen or seek emergency care

## 2020-08-30 NOTE — Telephone Encounter (Signed)
Patient's wife paged Luevenia Maxin, asked to speak with pulmonary doctor because he is short of breathShe was put through to me as I am covering the inpatient service. Asking for her husband to be seen by pulmonary doctor.  She thinks this is "due to having lung removed". On further review patient is known to Dr. Lamonte Sakai - recent lobectomy for lung cancer, sees Dr. Earlie Server.  I recommended she call our office and gave her number to schedule appointment.

## 2020-08-30 NOTE — Progress Notes (Unsigned)
Marcus    [name]        Marcus [name]. [place] Male, 75 y.o., 1945/05/08  MRN:  [id number] Phone:  [phone] Jerilynn Mages)       PCP:  [name], MD Coverage:  [place] [employer] Medicare Hmo/Ppo  Next Appt With Radiology ([facility]) 09/03/2020 at 9:00 AM           RE: Biopsy Received: 3 days ago  Message Details  Marcus Cleveland, MD  [name] Ok   CT core R pleual mass  Posterior approach see PETCT Im 14 Se8   [facility]    Previous Messages  ----- Message -----  From: [name]  Sent: 08/26/2020  1:50 PM EDT  To: [facility] Procedure Requests  Subject: Biopsy                      Procedure Requested: CT Biopsy    Reason for Procedure: PLease biopsy the right pleural disease which is suspicious for cancer recurrence    Provider Requesting: [name], [name]  Provider Telephone: [phone]

## 2020-08-30 NOTE — Assessment & Plan Note (Addendum)
Recently found recurrent non-small cell lung cancer-recent PET scan shows extensive involvement with pleural involvement, 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  Thickening at the near distal esophagus with infiltration of the fat showing increased metabolic activity and associated nodal disease in the mediastinum.  Patient has been seen by oncology and has plans to restart chemo in the near future. Suspect a lot of his symptoms are due to disease recurrence.  Plan  Patient Instructions  Warm heat to rib/back area Tylenol As needed  Follow up with Oncology for Chemo as planned  Swallow precautions  Activity as tolerated.  Chest xray today  Follow up with Dr. Lamonte Sakai in 3  Months and As needed   Please contact office for sooner follow up if symptoms do not improve or worsen or seek emergency care

## 2020-08-30 NOTE — Assessment & Plan Note (Signed)
Pleuritic pain-along the upper back and ribs.  Patient is due supportive care.  Check chest x-ray today.  Warm heat as needed.  Tylenol as needed.

## 2020-08-30 NOTE — Progress Notes (Signed)
@Patient  ID: Marcus [name], male    DOB: Jul 11, 1945, 75 y.o.   MRN: [id number]  No chief complaint on file.   Referring provider: [name], MD  HPI: 75 year old male former smoker followed for  non-small cell lung cancer, adenocarcinoma initial diagnosed as stage IIIb with a right upper lobe lung mass in addition to right hilar and mediastinal lymphadenopathy.  This was diagnosed in August 2021.  April 2022 with signs of disease recurrence with extensive pleural involvement.  Likely extending into the extrapleural fat.  With associated nodal disease in the mediastinum.  There was also developing lymphangitic carcinomatosis in the right lung apex.  TEST/EVENTS :  Oncology tx list  Right upper lobectomy with lymph node sampling under the care of Dr. Kipp Brood on December 08, 2019. Adjuvant systemic chemotherapy with Cisplatin75 mg/m2and Alimta 500 mg/m2 IV every 3 weeks.Last dose on 04/13/20. Status post4cycles.  CURRENT THERAPY: Palliative systemic chemotherapy with carboplatin for an AUC of 5, Alimta, 500 mg/m2, and Keytruda 200 mg/m2. First dose expected on 09/01/20.    08/30/2020 Follow up ; COPD , Lung cancer  Patient presents for a work in visit.  Patient has underlying COPD.  Diagnosed with stage IIIb right upper lobe non-small cell lung cancer August 2021.  Underwent right upper lobectomy in August 2021.  Treated with adjunctive systemic chemo last dose April 13, 2020.  Patient with increased shortness of breath subsequent CT chest showed a new loculated pleural effusion on the right.  Patient underwent thoracentesis that showed rare atypical cells concerning for malignancy.  PET scan completed on Aug 20, 2020 showed signs of extensive pleural involvement likely extend into the extrapleural fat in the right chest compatible with disease recurrence and associated nodal disease in the mediastinum.  Developing lymphangitic carcinomatosis in the right lung apex. Thickening near  the distal esophagus with infiltration of the fat also showing increased metabolic activity. Patient is following with oncology with plans to restart chemo soon .  He complains that over the last month he continues to have tenderness along the mid to upper back and lateral right ribs.  Some mild pleuritic pain with deep inspiration.  Minimum cough. He has had no hemoptysis, fever, chest pain, orthopnea. Does notice that he can swallow fine but after eating he feels some irritation with swallowing.  He denies any nausea vomiting or diarrhea.  No hematemesis or bloody stools. Is taking Tylenol as needed for pain.  He says if he takes it on a consistent basis he seems to manage his pain   Allergies  Allergen Reactions  . Amlodipine Anaphylaxis    Weakness and fatigue   . Sulfa Antibiotics Shortness Of Breath  . Tetanus Toxoids Anaphylaxis  . Crestor [Rosuvastatin]     Myalgia   . Penicillins Hives    Has patient had a PCN reaction causing immediate rash, facial/tongue/throat swelling, SOB or lightheadedness with hypotension: No Has patient had a PCN reaction causing severe rash involving mucus membranes or skin necrosis: Yes Has patient had a PCN reaction that required hospitalization: No Has patient had a PCN reaction occurring within the last 10 years: No If all of the above answers are "NO", then may proceed with Cephalosporin use.     Immunization History  Administered Date(s) Administered  . Fluad Quad(high Dose 65+) 12/18/2018  . Influenza Nasal 04/19/2010  . Influenza Split 12/22/2015, 12/27/2016, 01/09/2018  . Influenza, High Dose Seasonal PF 12/27/2016, 01/09/2018, 01/30/2020  . Influenza,inj,Quad PF,6+ Mos 02/02/2014  .  Influenza,inj,quad, With Preservative 04/18/2013  . Influenza-Unspecified 02/09/2015, 01/09/2018, 12/11/2018  . Moderna Sars-Covid-2 Vaccination 05/01/2019, 05/30/2019, 01/31/2020, 01/31/2020  . Pneumococcal Conjugate-13 07/21/2013  . Pneumococcal  Polysaccharide-23 12/21/2014  . Zoster Recombinat (Shingrix) 12/27/2016, 03/21/2017    Past Medical History:  Diagnosis Date  . Atrial fibrillation ([place])   . Dyspnea    with exertion   . Dysrhythmia   . Hypertension   . Obstructive sleep apnea 10/25/2007   cpap  . S/P AVR    2D ECHO, 04/25/2011 - EF >55%, Right ventricle-mild-moderately dilated  . Swelling of limb    LEA VENOUS, 08/22/2009 - no evidence of deep vein or superficial thrombosis; partially rupturing Baker's Cyst    Tobacco History: Social History   Tobacco Use  Smoking Status Former Smoker  . Types: Cigarettes  . Quit date: 04/11/1987  . Years since quitting: 33.4  Smokeless Tobacco Never Used   Counseling given: Not Answered   Outpatient Medications Prior to Visit  Medication Sig Dispense Refill  . acetaminophen (TYLENOL) 325 MG tablet Take 2 tablets (650 mg total) by mouth every 6 (six) hours as needed for mild pain.    Marland Kitchen ALPRAZolam (XANAX) 0.25 MG tablet Take 1 tablet (0.25 mg total) by mouth every 8 (eight) hours as needed for up to 20 doses for anxiety. 20 tablet 0  . aspirin EC 81 MG tablet Take 81 mg by mouth daily.    Marland Kitchen atorvastatin (LIPITOR) 80 MG tablet TAKE 1 TABLET BY MOUTH EVERY DAY (Patient taking differently: Take 80 mg by mouth daily.) 90 tablet 2  . carboxymethylcellulose (REFRESH PLUS) 0.5 % SOLN Place 1 drop into both eyes daily as needed (dry eyes).     . cetirizine (ZYRTEC) 10 MG tablet Take 10 mg by mouth daily.    . fexofenadine (ALLEGRA) 180 MG tablet Take 180 mg by mouth every evening.    . folic acid (FOLVITE) 1 MG tablet Take 1 tablet (1 mg total) by mouth daily. 30 tablet 2  . Multiple Vitamins-Minerals (EYE SUPPORT PO) Take 1 tablet by mouth daily.    . NONFORMULARY OR COMPOUNDED ITEM Apply 2 sprays topically as directed. Testosterone Topical  Preparation 100 mg (Compounded by [place])  Apply 2 clicks (50 mg) daily    . prochlorperazine (COMPAZINE) 10 MG  tablet Take 1 tablet (10 mg total) by mouth every 6 (six) hours as needed. 30 tablet 2  . ramipril (ALTACE) 10 MG capsule TAKE 1 CAPSULE BY MOUTH EVERY DAY 90 capsule 3  . warfarin (COUMADIN) 10 MG tablet TAKE 1 TO 1 AND 1/2 TABLETS DAILY AS DIRECTED BY COUMADIN CLINIC (Patient taking differently: Take 10-15 mg by mouth daily at 4 PM. 10 mg everyday except Tuesday & Thursday patient takes 15 mg) 135 tablet 1  . methylPREDNISolone (MEDROL DOSEPAK) 4 MG TBPK tablet Use as instructed (Patient not taking: Reported on 08/30/2020) 21 tablet 0   No facility-administered medications prior to visit.     Review of Systems:   Constitutional:   No  weight loss, night sweats,  Fevers, chills,  +fatigue, or  lassitude.  HEENT:   No headaches,  Difficulty swallowing,  Tooth/dental problems, or  Sore throat,                No sneezing, itching, ear ache, nasal congestion, post nasal drip,   CV:  No chest pain,  Orthopnea, PND, swelling in lower extremities, anasarca, dizziness, palpitations, syncope.   GI  No heartburn, indigestion,  abdominal pain, nausea, vomiting, diarrhea, change in bowel habits, loss of appetite, bloody stools.   Resp: No    No excess mucus, no productive cough,  No non-productive cough,  No coughing up of blood.  No change in color of mucus.  No wheezing.  No chest wall deformity  Skin: no rash or lesions.  GU: no dysuria, change in color of urine, no urgency or frequency.  No flank pain, no hematuria   MS:  No joint pain or swelling.  No decreased range of motion.  No back pain.    Physical Exam  BP 122/66 (BP Location: Right Arm, Cuff Size: Normal)   Pulse 71   Temp (!) 97.4 F (36.3 C) (Temporal)   Ht 5\' 10"  (1.778 m)   Wt 169 lb 12.8 oz (77 kg)   SpO2 98%   BMI 24.36 kg/m   GEN: A/Ox3; pleasant , NAD, well nourished    HEENT:  Sharp/AT,    THROAT-clear, no lesions, no postnasal drip or exudate noted.   NECK:  Supple w/ fair ROM; no JVD; normal carotid impulses w/o  bruits; no thyromegaly or nodules palpated; no lymphadenopathy.    RESP  Clear  P & A; w/o, wheezes/ rales/ or rhonchi. no accessory muscle use, no dullness to percussion  CARD:  RRR, no m/r/g, no peripheral edema, pulses intact, no cyanosis or clubbing.  GI:   Soft & nt; nml bowel sounds; no organomegaly or masses detected.   Musco: Warm bil, no deformities or joint swelling noted.   Neuro: alert, no focal deficits noted.    Skin: Warm, no lesions or rashes    Lab Results:  CBC    Component Value Date/Time   WBC 8.8 08/13/2020 0951   RBC 3.06 (L) 08/13/2020 0951   HGB 9.7 (L) 08/13/2020 0951   HGB 9.4 (L) 05/14/2020 1336   HCT 28.6 (L) 08/13/2020 0951   PLT 324.0 08/13/2020 0951   PLT 148 (L) 05/14/2020 1336   MCV 93.2 08/13/2020 0951   MCH 32.5 08/01/2020 1134   MCHC 33.8 08/13/2020 0951   RDW 12.9 08/13/2020 0951   LYMPHSABS 1.0 08/13/2020 0951   MONOABS 0.6 08/13/2020 0951   EOSABS 0.0 08/13/2020 0951   BASOSABS 0.1 08/13/2020 0951    BMET    Component Value Date/Time   NA 138 08/13/2020 0951   NA 139 10/28/2019 1057   K 4.5 08/13/2020 0951   CL 102 08/13/2020 0951   CO2 27 08/13/2020 0951   GLUCOSE 90 08/13/2020 0951   BUN 21 08/13/2020 0951   BUN 10 10/28/2019 1057   CREATININE 0.92 08/13/2020 0951   CREATININE 0.94 05/14/2020 1336   CALCIUM 8.5 08/13/2020 0951   GFRNONAA >60 08/01/2020 1134   GFRNONAA >60 05/14/2020 1336   GFRAA >60 01/01/2020 1227    BNP No results found for: BNP  ProBNP No results found for: PROBNP  Imaging: DG Chest 1 View  Result Date: 08/05/2020 CLINICAL DATA:  History of adenocarcinoma of the RIGHT lung found to have increasing pleural fluid. EXAM: CHEST  1 VIEW COMPARISON:  Prior chest CTs most recent of August 01, 2020 FINDINGS: Post median sternotomy for coronary revascularization. Post partial lung resection as before. Loculated pleural fluid along the RIGHT lateral chest and above the RIGHT hemidiaphragm tracking into  the fissural reflections in the RIGHT chest showing similar configuration when compared to recent imaging. Volume grossly similar to the prior study. Paramediastinal density along the RIGHT superior mediastinum with similar  appearance likely reflecting post treatment changes. No pneumothorax. LEFT lung is clear. On limited assessment no acute skeletal process. IMPRESSION: 1. Loculated pleural fluid along the RIGHT lateral chest and above the RIGHT hemidiaphragm tracking into the fissural reflections in the RIGHT chest showing similar configuration when compared to recent imaging. Perhaps slightly decreased as compared to scout image from recent CT along the lateral chest. No pneumothorax visualized. Electronically Signed   By: Zetta Bills M.D.   On: 08/05/2020 12:02   CT Angio Chest PE W and/or Wo Contrast  Result Date: 08/01/2020 CLINICAL DATA:  Evaluate for acute pulmonary embolus. History of lung cancer. Now with shortness of breath EXAM: CT ANGIOGRAPHY CHEST CT ABDOMEN AND PELVIS WITH CONTRAST TECHNIQUE: Multidetector CT imaging of the chest was performed using the standard protocol during bolus administration of intravenous contrast. Multiplanar CT image reconstructions and MIPs were obtained to evaluate the vascular anatomy. Multidetector CT imaging of the abdomen and pelvis was performed using the standard protocol during bolus administration of intravenous contrast. CONTRAST:  121mL OMNIPAQUE IOHEXOL 350 MG/ML SOLN COMPARISON:  05/14/2020 FINDINGS: CTA CHEST FINDINGS Cardiovascular: Satisfactory opacification of the pulmonary arteries to the segmental level. No evidence of pulmonary embolism. Normal heart size. No pericardial effusion. Aortic atherosclerosis. Previous CABG procedure. Mediastinum/Nodes: No enlarged mediastinal, hilar, or axillary lymph nodes. Thyroid gland, trachea, and esophagus demonstrate no significant findings. Lungs/Pleura: There is a moderate loculated right pleural effusion,  new from previous exam. Additionally, there is mild pleural soft tissue thickening overlying the apical portions of the right lung which measures 4 mm in thickness, also new when compared with the previous exam. No focal parenchymal nodule or mass identified. Mild changes of emphysema. Musculoskeletal: No chest wall abnormality. No acute or significant osseous findings. Review of the MIP images confirms the above findings. CT ABDOMEN and PELVIS FINDINGS Hepatobiliary: Normal appearance of the liver. No suspicious liver abnormality. Small gallstone noted within the dependent portion of the gallbladder. No gallbladder wall thickening or pericholecystic fluid. Pancreas: Unremarkable. No pancreatic ductal dilatation or surrounding inflammatory changes. Spleen: Normal in size without focal abnormality. Adrenals/Urinary Tract: The adrenal glands appear normal. Bilateral kidney cysts. The largest is in the posterolateral cortex of the right mid kidney measuring 2.7 cm. Urinary bladder is unremarkable. Stomach/Bowel: Small hiatal hernia. The appendix is visualized and appears normal. Colonic scratch set sigmoid diverticulosis noted without signs of acute inflammation. No bowel wall thickening, inflammation or distension. Vascular/Lymphatic: Aortic atherosclerosis. No enlarged abdominal or pelvic lymph nodes. Reproductive: Prostate is unremarkable. Other: No free fluid or fluid collections. Small fat containing umbilical hernia is noted. Musculoskeletal: No acute or significant osseous findings. Review of the MIP images confirms the above findings. IMPRESSION: 1. No evidence for acute pulmonary embolus. 2. New moderate loculated right pleural effusion with mild pleural soft tissue thickening overlying the apical portions of the right lung. Etiology is indeterminate. If there is a clinical concern for pleural spread of disease consider further evaluation with diagnostic thoracentesis. 3. No acute findings within the abdomen  or pelvis. 4. Emphysema and aortic atherosclerosis. Aortic Atherosclerosis (ICD10-I70.0) and Emphysema (ICD10-J43.9). Electronically Signed   By: Kerby Moors M.D.   On: 08/01/2020 13:30   CT ABDOMEN PELVIS W CONTRAST  Result Date: 08/01/2020 CLINICAL DATA:  Evaluate for acute pulmonary embolus. History of lung cancer. Now with shortness of breath EXAM: CT ANGIOGRAPHY CHEST CT ABDOMEN AND PELVIS WITH CONTRAST TECHNIQUE: Multidetector CT imaging of the chest was performed using the standard protocol during bolus administration  of intravenous contrast. Multiplanar CT image reconstructions and MIPs were obtained to evaluate the vascular anatomy. Multidetector CT imaging of the abdomen and pelvis was performed using the standard protocol during bolus administration of intravenous contrast. CONTRAST:  138mL OMNIPAQUE IOHEXOL 350 MG/ML SOLN COMPARISON:  05/14/2020 FINDINGS: CTA CHEST FINDINGS Cardiovascular: Satisfactory opacification of the pulmonary arteries to the segmental level. No evidence of pulmonary embolism. Normal heart size. No pericardial effusion. Aortic atherosclerosis. Previous CABG procedure. Mediastinum/Nodes: No enlarged mediastinal, hilar, or axillary lymph nodes. Thyroid gland, trachea, and esophagus demonstrate no significant findings. Lungs/Pleura: There is a moderate loculated right pleural effusion, new from previous exam. Additionally, there is mild pleural soft tissue thickening overlying the apical portions of the right lung which measures 4 mm in thickness, also new when compared with the previous exam. No focal parenchymal nodule or mass identified. Mild changes of emphysema. Musculoskeletal: No chest wall abnormality. No acute or significant osseous findings. Review of the MIP images confirms the above findings. CT ABDOMEN and PELVIS FINDINGS Hepatobiliary: Normal appearance of the liver. No suspicious liver abnormality. Small gallstone noted within the dependent portion of the  gallbladder. No gallbladder wall thickening or pericholecystic fluid. Pancreas: Unremarkable. No pancreatic ductal dilatation or surrounding inflammatory changes. Spleen: Normal in size without focal abnormality. Adrenals/Urinary Tract: The adrenal glands appear normal. Bilateral kidney cysts. The largest is in the posterolateral cortex of the right mid kidney measuring 2.7 cm. Urinary bladder is unremarkable. Stomach/Bowel: Small hiatal hernia. The appendix is visualized and appears normal. Colonic scratch set sigmoid diverticulosis noted without signs of acute inflammation. No bowel wall thickening, inflammation or distension. Vascular/Lymphatic: Aortic atherosclerosis. No enlarged abdominal or pelvic lymph nodes. Reproductive: Prostate is unremarkable. Other: No free fluid or fluid collections. Small fat containing umbilical hernia is noted. Musculoskeletal: No acute or significant osseous findings. Review of the MIP images confirms the above findings. IMPRESSION: 1. No evidence for acute pulmonary embolus. 2. New moderate loculated right pleural effusion with mild pleural soft tissue thickening overlying the apical portions of the right lung. Etiology is indeterminate. If there is a clinical concern for pleural spread of disease consider further evaluation with diagnostic thoracentesis. 3. No acute findings within the abdomen or pelvis. 4. Emphysema and aortic atherosclerosis. Aortic Atherosclerosis (ICD10-I70.0) and Emphysema (ICD10-J43.9). Electronically Signed   By: Kerby Moors M.D.   On: 08/01/2020 13:30   NM PET Image Restag (PS) Skull Base To Thigh  Result Date: 08/21/2020 CLINICAL DATA:  Subsequent treatment strategy for non-small cell lung cancer, assess treatment response in the setting of new pleural fluid/nodularity. EXAM: NUCLEAR MEDICINE PET SKULL BASE TO THIGH TECHNIQUE: 8.6 mCi F-18 FDG was injected intravenously. Full-ring PET imaging was performed from the skull base to thigh after the  radiotracer. CT data was obtained and used for attenuation correction and anatomic localization. Fasting blood glucose: 90 mg/dl COMPARISON:  Multiple prior studies most recent from April of 2022 and PET exams dating back to January of 2021. FINDINGS: Mediastinal blood pool activity: SUV max 2.76 Liver activity: SUV max NA NECK: No hypermetabolic lymph nodes in the neck. Incidental CT findings: none CHEST: Near circumferential pleural thickening in the chest with signs of nodularity, most pronounced on image 100 of series 4, this area measuring 7.7 x 2.3 cm with a maximum SUV of 6.6. Subtle pleural thickening elsewhere with increased metabolic activity, for instance on image 55 of series 4 maximum SUV of 8.6 with respect to pleural thickening at the lung apex. Increased pleural thickening adjacent  to previous mediastinal activity that is since been resected measuring 7 mm on image 73 of series 4 with a maximum SUV of 6.2. Intense FDG uptake tracks along the RIGHT lateral and anterior inferior chest into the cardiophrenic recess and along the RIGHT hemidiaphragm. Maximum SUV just lateral to the RIGHT cardiophrenic recess 8.4. Thickening near the distal esophagus with infiltration of the fat also showing increased metabolic activity. (Image 82/4) 11 mm subcarinal lymph node with a maximum SUV of 5.7. Mildly enlarged RIGHT paratracheal lymph node (image 74/4) mild FDG uptake measuring 8 mm short axis with a maximum SUV of 4.1. Incidental CT findings: Post median sternotomy. Signs of aortic valve replacement and CABG. Normal heart size without substantial pericardial effusion. Normal caliber central pulmonary vasculature. Limited assessment of cardiovascular structures given lack of intravenous contrast. Septal thickening, irregular septal thickening at the RIGHT lung apex with changes of RIGHT upper lobectomy as before. Septal thickening is a new finding based on comparison with imaging from February. No dense  consolidation. Airways are patent. ABDOMEN/PELVIS: Disease tracks into the most inferior aspect of the costodiaphragmatic recess, no definite signs of disease however noted in the abdomen. Minimal thickening of the LEFT adrenal gland appears similar to prior imaging. Mildly increased FDG uptake in this area is similar to background liver, only slightly above background liver with a maximum SUV of 3.6 Incidental CT findings: No acute findings relative to liver, gallbladder, spleen, pancreas, adrenal glands and kidneys. Small cyst in the interpolar RIGHT kidney. Urinary bladder decompressed. No acute gastrointestinal finding.  Appendix is normal. Extensive aortic atheromatous plaque. Dilated iliac vasculature with similar appearance, aneurysmal caliber of the LEFT common iliac artery at the internal external bifurcation 2.6 cm similar to previous imaging. Prostate unremarkable by CT though with focal area of FDG uptake in the posterolateral LEFT hemi prostate with an SUV of 5.4 in the approximate location of the mid gland (image 193/3) SKELETON: No definite signs of skeletal metastatic disease though chest wall involvement on the RIGHT is suspected and areas of FDG uptake are closely associated with adjacent ribs. Incidental CT findings: There is involvement however likely of extrapleural fat in the RIGHT chest due to circumferential pleural involvement. IMPRESSION: Signs of extensive pleural involvement, likely extending into extrapleural fat, in the RIGHT chest compatible with disease recurrence and associated with nodal disease in the mediastinum. 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 Decreased pleural fluid compared to August 01, 2020 following thoracentesis. Equivocal uptake in the LEFT adrenal gland, attention on follow-up. Adrenal thickening is unchanged dating back to 2021 and is only very mild. Focal uptake in the prostate. Correlation with PSA may be helpful. Findings are nonspecific  and could reflect prostatitis but could also be seen in the setting of prostate cancer. Aortic atherosclerosis with aneurysmal dilation of the iliac vasculature similar to prior imaging. Aortic Atherosclerosis (ICD10-I70.0). Electronically Signed   By: Zetta Bills M.D.   On: 08/21/2020 09:32   DG Chest Portable 1 View  Result Date: 08/01/2020 CLINICAL DATA:  Worsening shortness of breath over the past week. History of right lung cancer status post right upper lobe lobectomy. EXAM: PORTABLE CHEST 1 VIEW COMPARISON:  Chest CT 05/14/2020 and chest x-ray 01/23/2020 FINDINGS: The heart is normal in size and stable. The mediastinal and hilar contours are within normal limits and unchanged. Stable surgical changes from aortic valve replacement surgery. New right, possibly loculated, pleural effusion and overlying atelectasis. This was not present on a recent chest CT from  05/14/2020. Chest CT may be helpful for further evaluation. The left lung is clear. No edema or infiltrates. The bony thorax is intact. IMPRESSION: New right pleural effusion and overlying atelectasis. Electronically Signed   By: Marijo Sanes M.D.   On: 08/01/2020 12:11   IR THORACENTESIS ASP PLEURAL SPACE W/IMG GUIDE  Result Date: 08/05/2020 INDICATION: History of right-sided lung cancer, previously treated. Now found to have loculated right pleural effusion. Request diagnostic and possibly therapeutic thoracentesis. EXAM: ULTRASOUND GUIDED RIGHT THORACENTESIS MEDICATIONS: 1% plain lidocaine, 5 mL COMPLICATIONS: None immediate. PROCEDURE: An ultrasound guided thoracentesis was thoroughly discussed with the patient and questions answered. The benefits, risks, alternatives and complications were also discussed. The patient understands and wishes to proceed with the procedure. Written consent was obtained. Ultrasound of the right chest demonstrates severely loculated small right pleural effusion. Multiple septations noted. Largest pocket chosen  for attempt at thoracentesis. Ultrasound was performed to localize and mark an adequate pocket of fluid in the right chest. The area was then prepped and draped in the normal sterile fashion. 1% Lidocaine was used for local anesthesia. Under ultrasound guidance a 6 Fr Safe-T-Centesis catheter was introduced. Thoracentesis was performed. The catheter was removed and a dressing applied. FINDINGS: A total of approximately only 5 mL of hazy, serosanguineous fluid was removed. Samples were sent to the laboratory as requested by the clinical team. IMPRESSION: Successful ultrasound guided right thoracentesis yielding 5 mL of pleural fluid. Read by: Ascencion Dike PA-C Electronically Signed   By: Jacqulynn Cadet M.D.   On: 08/05/2020 12:15      PFT Results Latest Ref Rng & Units 06/10/2019  FVC-Pre L 5.06  FVC-Predicted Pre % 118  FVC-Post L 5.01  FVC-Predicted Post % 117  Pre FEV1/FVC % % 74  Post FEV1/FCV % % 76  FEV1-Pre L 3.73  FEV1-Predicted Pre % 120  FEV1-Post L 3.79  DLCO uncorrected ml/min/mmHg 27.91  DLCO [facility]% % 110  DLVA Predicted % 86  TLC L 9.24  TLC % Predicted % 131  RV % Predicted % 153    No results found for: NITRICOXIDE      Assessment & Plan:   Recurrent non-small cell lung cancer (HCC) Recently found recurrent non-small cell lung cancer-recent PET scan shows extensive involvement with pleural involvement, 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  Thickening at the near distal esophagus with infiltration of the fat showing increased metabolic activity and associated nodal disease in the mediastinum.  Patient has been seen by oncology and has plans to restart chemo in the near future. Suspect a lot of his symptoms are due to disease recurrence.  Plan  Patient Instructions  Warm heat to rib/back area Tylenol As needed  Follow up with Oncology for Chemo as planned  Swallow precautions  Activity as tolerated.  Chest xray today  Follow up with Dr. Lamonte Sakai in 3   Months and As needed   Please contact office for sooner follow up if symptoms do not improve or worsen or seek emergency care       Pleuritic pain Pleuritic pain-along the upper back and ribs.  Patient is due supportive care.  Check chest x-ray today.  Warm heat as needed.  Tylenol as needed.     Rexene Edison, NP 08/30/2020

## 2020-08-30 NOTE — Telephone Encounter (Signed)
Mrs. Banh contacted the office for her husband, patient Marcus [name]. Per wife, he has been experiencing right sided chest pain that radiates from his incision to the front of his chest and occasionally to his back. Patient describes the pain as a "burning" sensation. Wife states he is taking Tylenol nearly daily and this is affecting his INR levels. States they have brought this pain up to a few of his other doctors but they do not offer any suggestions. Appointment made with Dr. Kipp Brood to discuss lingering pain for 6/10 at 10:55am. Wife acknowledges receipt.

## 2020-08-31 ENCOUNTER — Encounter (HOSPITAL_COMMUNITY): Payer: Self-pay

## 2020-08-31 ENCOUNTER — Telehealth: Payer: Self-pay | Admitting: Pharmacist Clinician (PhC)/ Clinical Pharmacy Specialist

## 2020-08-31 NOTE — Telephone Encounter (Signed)
-----   Message from [place], [place] sent at 08/31/2020  8:27 AM EDT ----- Regarding: FW: Biopsy  ----- Message ----- From: [name], MD Sent: 08/30/2020  12:38 PM EDT To: [name], MD, [name], # Subject: RE: Biopsy                                     Mr. Dominic has a mechanical aortic valve prosthesis and will need "bridging" with enoxaparin for the procedure.  Please let us know the date of his procedure and we can provide the instructions and  coordinate that through our Anticoagulation Clinic.  ----- Message ----- From: [name] Sent: 08/30/2020  11:10 AM EDT To: [name], MD, # Subject: FW: Biopsy                                     Hello   Pt. Obara is on Aspirin and Coumadin. He will need to hold his aspirin for three days And his Coumadin for four days prior to his biopsy. Permission is needed.   [place] ----- Message ----- From: [name], MD Sent: 08/27/2020   2:56 PM EDT To: [name] Subject: RE: Biopsy                                     Ok  CT core R pleual mass Posterior approach see PETCT Im 55 Se8  DDH   ----- Message ----- From: [name] Sent: 08/26/2020   1:50 PM EDT To: [facility] Procedure Requests Subject: Biopsy                                         Procedure Requested:  CT Biopsy    Reason for Procedure: PLease biopsy the right pleural disease which is suspicious for cancer recurrence   Provider Requesting: [name], [name] Provider Telephone:  [phone]   Other Info:

## 2020-08-31 NOTE — Telephone Encounter (Signed)
Spoke with wife.  INR was 5.9 yesterday.  Held dose yesterday and again today.  Advised to take only 5 mg Wed/Thur and repeat INR on Friday.  Patient to have biopsy on Tuesday and needs 4 day hold, but with recently elevated INR's will have patient repeat home INR test Friday so we can best determine when to start enoxaparin.    Wife voiced understanding, will call Friday morning with INR.    77 kg x 1.5 = 115.5.  Will give enoxaparin 120 mg q24 hrs Fri, Sat and Sun evenngs, (determine if Friday dose needed when INR report comes in).

## 2020-08-31 NOTE — Telephone Encounter (Signed)
LMOM for wife to call and confirm date of biopsy.  (per Epic is May 31).  Will need lovenox bridge, MD requesting 4 day warfarin hold

## 2020-08-31 NOTE — Progress Notes (Signed)
Marcus [name] Male, 75 y.o., November 12, 1945  MRN:  [id number] Phone:  [phone] Jerilynn Mages)       PCP:  [name], MD Coverage:  [place] [employer] Hmo/Ppo  Next Appt With Radiology ([facility] 1) 09/03/2020 at 9:00 AM           RE: Biopsy Received: Yesterday  Message Details  [name], [name], MD  [name], MD; P [facility] Antico[facility]; [name], [name], MD Mr. Pretty has a mechanical aortic valve prosthesis and will need "bridging" with enoxaparin for the procedure.  Please let us know the date of his procedure and we can provide the instructions and coordinate that through our Anticoagulation Clinic.    Previous Messages  ----- Message -----  From: [name]  Sent: 08/30/2020 11:10 AM EDT  To: [name], MD, *  Subject: FW: Biopsy                    Hello   Pt. Roberti is on Aspirin and Coumadin.  He will need to hold his aspirin for three days  And his Coumadin for four days prior to his biopsy.  Permission is needed.   [name]  ----- Message -----  From: [name], MD  Sent: 08/27/2020  2:56 PM EDT  To: [name]  Subject: RE: Biopsy                    Ok   CT core R pleual mass  Posterior approach see PETCT Im 55 Se8   DDH    ----- Message -----  From: [name]  Sent: 08/26/2020  1:50 PM EDT  To: Ir Procedure Requests  Subject: Biopsy                      Procedure Requested: CT Biopsy    Reason for Procedure: PLease biopsy the right pleural disease which is suspicious for cancer recurrence    Provider Requesting: [name], [name]  Provider Telephone: [phone]    Other Info:

## 2020-09-01 ENCOUNTER — Ambulatory Visit: Payer: Medicare HMO | Admitting: Cardiovascular Disease

## 2020-09-01 DIAGNOSIS — C3492 Malignant neoplasm of unspecified part of left bronchus or lung: Secondary | ICD-10-CM | POA: Diagnosis not present

## 2020-09-01 NOTE — Telephone Encounter (Signed)
pts wife calling to talk to kristinregarding lovenox will route to pharmd pool to discuss

## 2020-09-01 NOTE — Telephone Encounter (Signed)
Returned call to pt's wife. She wanted to clarify when Lovenox needs to begin. Reviewed instructions from Erasmo Downer and that INR checked on Friday will determine when first dose of Lovenox will need to be given. Rx will be sent in that day to pharmacy with correct quantity. She was appreciative for the call.

## 2020-09-02 ENCOUNTER — Other Ambulatory Visit: Payer: Self-pay | Admitting: Radiology

## 2020-09-02 ENCOUNTER — Other Ambulatory Visit: Payer: Self-pay | Admitting: Student

## 2020-09-03 ENCOUNTER — Inpatient Hospital Stay: Payer: Medicare HMO

## 2020-09-03 ENCOUNTER — Other Ambulatory Visit: Payer: Self-pay

## 2020-09-03 ENCOUNTER — Ambulatory Visit (INDEPENDENT_AMBULATORY_CARE_PROVIDER_SITE_OTHER): Payer: Medicare HMO | Admitting: Cardiovascular Disease

## 2020-09-03 ENCOUNTER — Other Ambulatory Visit: Payer: Self-pay | Admitting: Radiology

## 2020-09-03 ENCOUNTER — Other Ambulatory Visit: Payer: Self-pay | Admitting: Physician Assistant

## 2020-09-03 ENCOUNTER — Ambulatory Visit (HOSPITAL_COMMUNITY)
Admission: RE | Admit: 2020-09-03 | Discharge: 2020-09-03 | Disposition: A | Discharge status: Home / Self Care | Payer: Medicare HMO | Source: Ambulatory Visit | Attending: Physician Assistant | Admitting: Physician Assistant

## 2020-09-03 VITALS — BP 116/63 | HR 60 | Temp 97.7°F | Ht 70.0 in | Wt 170.5 lb

## 2020-09-03 DIAGNOSIS — Z902 Acquired absence of lung [part of]: Secondary | ICD-10-CM | POA: Diagnosis not present

## 2020-09-03 DIAGNOSIS — R0789 Other chest pain: Secondary | ICD-10-CM | POA: Diagnosis not present

## 2020-09-03 DIAGNOSIS — J90 Pleural effusion, not elsewhere classified: Secondary | ICD-10-CM | POA: Diagnosis not present

## 2020-09-03 DIAGNOSIS — R5383 Other fatigue: Secondary | ICD-10-CM | POA: Diagnosis not present

## 2020-09-03 DIAGNOSIS — C3491 Malignant neoplasm of unspecified part of right bronchus or lung: Secondary | ICD-10-CM | POA: Diagnosis not present

## 2020-09-03 DIAGNOSIS — C3411 Malignant neoplasm of upper lobe, right bronchus or lung: Secondary | ICD-10-CM | POA: Diagnosis not present

## 2020-09-03 DIAGNOSIS — R630 Anorexia: Secondary | ICD-10-CM | POA: Diagnosis not present

## 2020-09-03 DIAGNOSIS — Z952 Presence of prosthetic heart valve: Secondary | ICD-10-CM

## 2020-09-03 DIAGNOSIS — Z5111 Encounter for antineoplastic chemotherapy: Secondary | ICD-10-CM | POA: Diagnosis not present

## 2020-09-03 DIAGNOSIS — C3490 Malignant neoplasm of unspecified part of unspecified bronchus or lung: Secondary | ICD-10-CM

## 2020-09-03 DIAGNOSIS — G319 Degenerative disease of nervous system, unspecified: Secondary | ICD-10-CM | POA: Diagnosis not present

## 2020-09-03 DIAGNOSIS — Z79899 Other long term (current) drug therapy: Secondary | ICD-10-CM

## 2020-09-03 DIAGNOSIS — Z5112 Encounter for antineoplastic immunotherapy: Secondary | ICD-10-CM | POA: Diagnosis not present

## 2020-09-03 LAB — CMP (CANCER CENTER ONLY)
ALT: 18 U/L (ref 0–44)
AST: 27 U/L (ref 15–41)
Albumin: 2.9 g/dL — ABNORMAL LOW (ref 3.5–5.0)
Alkaline Phosphatase: 114 U/L (ref 38–126)
Anion gap: 9 (ref 5–15)
BUN: 15 mg/dL (ref 8–23)
CO2: 25 mmol/L (ref 22–32)
Calcium: 8.7 mg/dL — ABNORMAL LOW (ref 8.9–10.3)
Chloride: 105 mmol/L (ref 98–111)
Creatinine: 0.88 mg/dL (ref 0.61–1.24)
GFR, Estimated: >60 mL/min (ref >60)
Glucose, Bld: 115 mg/dL — ABNORMAL HIGH (ref 70–99)
Potassium: 3.8 mmol/L (ref 3.5–5.1)
Sodium: 139 mmol/L (ref 135–145)
Total Bilirubin: 0.5 mg/dL (ref 0.3–1.2)
Total Protein: 7 g/dL (ref 6.5–8.1)

## 2020-09-03 LAB — CBC WITH DIFFERENTIAL (CANCER CENTER ONLY)
Abs Immature Granulocytes: 0.01 10*3/uL (ref 0.00–0.07)
Basophils Absolute: 0 10*3/uL (ref 0.0–0.1)
Basophils Relative: 1 %
Eosinophils Absolute: 0.1 10*3/uL (ref 0.0–0.5)
Eosinophils Relative: 1 %
HCT: 28.1 % — ABNORMAL LOW (ref 39.0–52.0)
Hemoglobin: 9 g/dL — ABNORMAL LOW (ref 13.0–17.0)
Immature Granulocytes: 0 %
Lymphocytes Relative: 14 %
Lymphs Abs: 0.8 10*3/uL (ref 0.7–4.0)
MCH: 30.5 pg (ref 26.0–34.0)
MCHC: 32 g/dL (ref 30.0–36.0)
MCV: 95.3 fL (ref 80.0–100.0)
Monocytes Absolute: 0.4 10*3/uL (ref 0.1–1.0)
Monocytes Relative: 6 %
Neutro Abs: 4.8 10*3/uL (ref 1.7–7.7)
Neutrophils Relative %: 78 %
Platelet Count: 209 10*3/uL (ref 150–400)
RBC: 2.95 MIL/uL — ABNORMAL LOW (ref 4.22–5.81)
RDW: 13.4 % (ref 11.5–15.5)
WBC Count: 6.1 10*3/uL (ref 4.0–10.5)
nRBC: 0 % (ref 0.0–0.2)

## 2020-09-03 LAB — TSH: TSH: 1.195 u[IU]/mL (ref 0.320–4.118)

## 2020-09-03 LAB — POCT INR: INR: 2.8 (ref 2.0–3.0)

## 2020-09-03 MED ORDER — SODIUM CHLORIDE 0.9 % IV SOLN
10.0000 mg | Freq: Once | INTRAVENOUS | Status: AC
  Administered 2020-09-03: 10 mg via INTRAVENOUS
  Filled 2020-09-03: qty 10

## 2020-09-03 MED ORDER — SODIUM CHLORIDE 0.9 % IV SOLN
200.0000 mg | Freq: Once | INTRAVENOUS | Status: AC
  Administered 2020-09-03: 200 mg via INTRAVENOUS
  Filled 2020-09-03: qty 8

## 2020-09-03 MED ORDER — PALONOSETRON HCL INJECTION 0.25 MG/5ML
INTRAVENOUS | Status: AC
  Filled 2020-09-03: qty 5

## 2020-09-03 MED ORDER — ENOXAPARIN SODIUM 120 MG/0.8ML IJ SOSY: 120.0000 mg | PREFILLED_SYRINGE | INTRAMUSCULAR | 1 refills | Status: DC

## 2020-09-03 MED ORDER — GADOBUTROL 1 MMOL/ML IV SOLN
8.0000 mL | Freq: Once | INTRAVENOUS | Status: AC | PRN
  Administered 2020-09-03: 8 mL via INTRAVENOUS

## 2020-09-03 MED ORDER — FOSAPREPITANT DIMEGLUMINE INJECTION 150 MG
150.0000 mg | Freq: Once | INTRAVENOUS | Status: AC
  Administered 2020-09-03: 150 mg via INTRAVENOUS
  Filled 2020-09-03: qty 150

## 2020-09-03 MED ORDER — PALONOSETRON HCL INJECTION 0.25 MG/5ML
0.2500 mg | Freq: Once | INTRAVENOUS | Status: AC
  Administered 2020-09-03: 0.25 mg via INTRAVENOUS

## 2020-09-03 MED ORDER — SODIUM CHLORIDE 0.9 % IV SOLN
500.0000 mg/m2 | Freq: Once | INTRAVENOUS | Status: AC
  Administered 2020-09-03: 1000 mg via INTRAVENOUS
  Filled 2020-09-03: qty 40

## 2020-09-03 MED ORDER — SODIUM CHLORIDE 0.9 % IV SOLN
476.5000 mg | Freq: Once | INTRAVENOUS | Status: AC
  Administered 2020-09-03: 480 mg via INTRAVENOUS
  Filled 2020-09-03: qty 48

## 2020-09-03 MED ORDER — SODIUM CHLORIDE 0.9 % IV SOLN
Freq: Once | INTRAVENOUS | Status: AC
Start: 2020-09-03 — End: 2020-09-03
  Filled 2020-09-03: qty 250

## 2020-09-03 NOTE — Patient Instructions (Signed)
[facility]  Discharge Instructions: Thank you for choosing [facility] to provide your oncology and hematology care.   If you have a lab appointment with the [facility], please go directly to the [facility] and check in at the registration area.   Wear comfortable clothing and clothing appropriate for easy access to any Portacath or PICC line.   We strive to give you quality time with your provider. You may need to reschedule your appointment if you arrive late (15 or more minutes).  Arriving late affects you and other patients whose appointments are after yours.  Also, if you miss three or more appointments without notifying the office, you may be dismissed from the clinic at the provider's discretion.      For prescription refill requests, have your pharmacy contact our office and allow 72 hours for refills to be completed.    Today you received the following chemotherapy and/or immunotherapy agents Keytruda, Alimta, Carboplatin   To help prevent nausea and vomiting after your treatment, we encourage you to take your nausea medication as directed.  BELOW ARE SYMPTOMS THAT SHOULD BE REPORTED IMMEDIATELY: . *FEVER GREATER THAN 100.4 F (38 C) OR HIGHER . *CHILLS OR SWEATING . *NAUSEA AND VOMITING THAT IS NOT CONTROLLED WITH YOUR NAUSEA MEDICATION . *UNUSUAL SHORTNESS OF BREATH . *UNUSUAL BRUISING OR BLEEDING . *URINARY PROBLEMS (pain or burning when urinating, or frequent urination) . *BOWEL PROBLEMS (unusual diarrhea, constipation, pain near the anus) . TENDERNESS IN MOUTH AND THROAT WITH OR WITHOUT PRESENCE OF ULCERS (sore throat, sores in mouth, or a toothache) . UNUSUAL RASH, SWELLING OR PAIN  . UNUSUAL VAGINAL DISCHARGE OR ITCHING   Items with * indicate a potential emergency and should be followed up as soon as possible or go to the Emergency Department if any problems should occur.  Please show the CHEMOTHERAPY ALERT CARD or  IMMUNOTHERAPY ALERT CARD at check-in to the Emergency Department and triage nurse.  Should you have questions after your visit or need to cancel or reschedule your appointment, please contact [facility]  Dept: [phone]  and follow the prompts.  Office hours are 8:00 a.m. to 4:30 p.m. Monday - Friday. Please note that voicemails left after 4:00 p.m. may not be returned until the following business day.  We are closed weekends and major holidays. You have access to a nurse at all times for urgent questions. Please call the main number to the clinic Dept: [phone] and follow the prompts.   For any non-urgent questions, you may also contact your provider using MyChart. We now offer e-Visits for anyone 24 and older to request care online for non-urgent symptoms. For details visit [email or web address].   Also download the MyChart app! Go to the app store, search "MyChart", open the app, select [facility], and log in with your MyChart username and password.  Due to Covid, a mask is required upon entering the hospital/clinic. If you do not have a mask, one will be given to you upon arrival. For doctor visits, patients may have 1 support person aged 36 or older with them. For treatment visits, patients cannot have anyone with them due to current Covid guidelines and our immunocompromised population.  Pembrolizumab [facility]) injection What is this medicine? PEMBROLIZUMAB (pem broe liz ue mab) is a monoclonal antibody. It is used to treat certain types of cancer. This medicine may be used for other purposes; ask your health care provider or pharmacist  if you have questions. COMMON BRAND NAME(S): Keytruda What should I tell my health care provider before I take this medicine? They need to know if you have any of these conditions:  autoimmune diseases like Crohn's disease, ulcerative colitis, or lupus  have had or planning to have an allogeneic stem cell transplant  (uses someone else's stem cells)  history of organ transplant  history of chest radiation  nervous system problems like myasthenia gravis or Guillain-Barre syndrome  an unusual or allergic reaction to pembrolizumab, other medicines, foods, dyes, or preservatives  pregnant or trying to get pregnant  breast-feeding How should I use this medicine? This medicine is for infusion into a vein. It is given by a health care professional in a hospital or clinic setting. A special MedGuide will be given to you before each treatment. Be sure to read this information carefully each time. Talk to your pediatrician regarding the use of this medicine in children. While this drug may be prescribed for children as young as 6 months for selected conditions, precautions do apply. Overdosage: If you think you have taken too much of this medicine contact a poison control center or emergency room at once. NOTE: This medicine is only for you. Do not share this medicine with others. What if I miss a dose? It is important not to miss your dose. Call your doctor or health care professional if you are unable to keep an appointment. What may interact with this medicine? Interactions have not been studied. This list may not describe all possible interactions. Give your health care provider a list of all the medicines, herbs, non-prescription drugs, or dietary supplements you use. Also tell them if you smoke, drink alcohol, or use illegal drugs. Some items may interact with your medicine. What should I watch for while using this medicine? Your condition will be monitored carefully while you are receiving this medicine. You may need blood work done while you are taking this medicine. Do not become pregnant while taking this medicine or for 4 months after stopping it. Women should inform their doctor if they wish to become pregnant or think they might be pregnant. There is a potential for serious side effects to an unborn  child. Talk to your health care professional or pharmacist for more information. Do not breast-feed an infant while taking this medicine or for 4 months after the last dose. What side effects may I notice from receiving this medicine? Side effects that you should report to your doctor or health care professional as soon as possible:  allergic reactions like skin rash, itching or hives, swelling of the face, lips, or tongue  bloody or black, tarry  breathing problems  changes in vision  chest pain  chills  confusion  constipation  cough  diarrhea  dizziness or feeling faint or lightheaded  fast or irregular heartbeat  fever  flushing  joint pain  low blood counts - this medicine may decrease the number of white blood cells, red blood cells and platelets. You may be at increased risk for infections and bleeding.  muscle pain  muscle weakness  pain, tingling, numbness in the hands or feet  persistent headache  redness, blistering, peeling or loosening of the skin, including inside the mouth  signs and symptoms of high blood sugar such as dizziness; dry mouth; dry skin; fruity breath; nausea; stomach pain; increased hunger or thirst; increased urination  signs and symptoms of kidney injury like trouble passing urine or change in the amount  of urine  signs and symptoms of liver injury like dark urine, light-colored stools, loss of appetite, nausea, right upper belly pain, yellowing of the eyes or skin  sweating  swollen lymph nodes  weight loss Side effects that usually do not require medical attention (report to your doctor or health care professional if they continue or are bothersome):  decreased appetite  hair loss  tiredness This list may not describe all possible side effects. Call your doctor for medical advice about side effects. You may report side effects to FDA at 1-800-FDA-1088. Where should I keep my medicine? This drug is given in a hospital  or clinic and will not be stored at home. NOTE: This sheet is a summary. It may not cover all possible information. If you have questions about this medicine, talk to your doctor, pharmacist, or health care provider.  2021 Elsevier/Gold Standard (2019-02-26 21:44:53)  Pemetrexed (Alimta) injection What is this medicine? PEMETREXED (PEM e TREX ed) is a chemotherapy drug used to treat lung cancers like non-small cell lung cancer and mesothelioma. It may also be used to treat other cancers. This medicine may be used for other purposes; ask your health care provider or pharmacist if you have questions. COMMON BRAND NAME(S): Alimta What should I tell my health care provider before I take this medicine? They need to know if you have any of these conditions:  infection (especially a virus infection such as chickenpox, cold sores, or herpes)  kidney disease  low blood counts, like low white cell, platelet, or red cell counts  lung or breathing disease, like asthma  radiation therapy  an unusual or allergic reaction to pemetrexed, other medicines, foods, dyes, or preservative  pregnant or trying to get pregnant  breast-feeding How should I use this medicine? This drug is given as an infusion into a vein. It is administered in a hospital or clinic by a specially trained health care professional. Talk to your pediatrician regarding the use of this medicine in children. Special care may be needed. Overdosage: If you think you have taken too much of this medicine contact a poison control center or emergency room at once. NOTE: This medicine is only for you. Do not share this medicine with others. What if I miss a dose? It is important not to miss your dose. Call your doctor or health care professional if you are unable to keep an appointment. What may interact with this medicine? This medicine may interact with the following medications:  Ibuprofen This list may not describe all possible  interactions. Give your health care provider a list of all the medicines, herbs, non-prescription drugs, or dietary supplements you use. Also tell them if you smoke, drink alcohol, or use illegal drugs. Some items may interact with your medicine. What should I watch for while using this medicine? Visit your doctor for checks on your progress. This drug may make you feel generally unwell. This is not uncommon, as chemotherapy can affect healthy cells as well as cancer cells. Report any side effects. Continue your course of treatment even though you feel ill unless your doctor tells you to stop. In some cases, you may be given additional medicines to help with side effects. Follow all directions for their use. Call your doctor or health care professional for advice if you get a fever, chills or sore throat, or other symptoms of a cold or flu. Do not treat yourself. This drug decreases your body's ability to fight infections. Try to avoid being  around people who are sick. This medicine may increase your risk to bruise or bleed. Call your doctor or health care professional if you notice any unusual bleeding. Be careful brushing and flossing your teeth or using a toothpick because you may get an infection or bleed more easily. If you have any dental work done, tell your dentist you are receiving this medicine. Avoid taking products that contain aspirin, acetaminophen, ibuprofen, naproxen, or ketoprofen unless instructed by your doctor. These medicines may hide a fever. Call your doctor or health care professional if you get diarrhea or mouth sores. Do not treat yourself. To protect your kidneys, drink water or other fluids as directed while you are taking this medicine. Do not become pregnant while taking this medicine or for 6 months after stopping it. Women should inform their doctor if they wish to become pregnant or think they might be pregnant. Men should not father a child while taking this medicine and  for 3 months after stopping it. This may interfere with the ability to father a child. You should talk to your doctor or health care professional if you are concerned about your fertility. There is a potential for serious side effects to an unborn child. Talk to your health care professional or pharmacist for more information. Do not breast-feed an infant while taking this medicine or for 1 week after stopping it. What side effects may I notice from receiving this medicine? Side effects that you should report to your doctor or health care professional as soon as possible:  allergic reactions like skin rash, itching or hives, swelling of the face, lips, or tongue  breathing problems  redness, blistering, peeling or loosening of the skin, including inside the mouth  signs and symptoms of bleeding such as bloody or black, tarry stools; red or dark-brown urine; spitting up blood or brown material that looks like coffee grounds; red spots on the skin; unusual bruising or bleeding from the eye, gums, or nose  signs and symptoms of infection like fever or chills; cough; sore throat; pain or trouble passing urine  signs and symptoms of kidney injury like trouble passing urine or change in the amount of urine  signs and symptoms of liver injury like dark yellow or brown urine; general ill feeling or flu-like symptoms; light-colored stools; loss of appetite; nausea; right upper belly pain; unusually weak or tired; yellowing of the eyes or skin Side effects that usually do not require medical attention (report to your doctor or health care professional if they continue or are bothersome):  constipation  mouth sores  nausea, vomiting  unusually weak or tired This list may not describe all possible side effects. Call your doctor for medical advice about side effects. You may report side effects to FDA at 1-800-FDA-1088. Where should I keep my medicine? This drug is given in a hospital or clinic and  will not be stored at home. NOTE: This sheet is a summary. It may not cover all possible information. If you have questions about this medicine, talk to your doctor, pharmacist, or health care provider.  2021 Elsevier/Gold Standard (2017-05-16 16:11:33)  Carboplatin injection What is this medicine? CARBOPLATIN (KAR boe pla tin) is a chemotherapy drug. It targets fast dividing cells, like cancer cells, and causes these cells to die. This medicine is used to treat ovarian cancer and many other cancers. This medicine may be used for other purposes; ask your health care provider or pharmacist if you have questions. COMMON BRAND NAME(S): Paraplatin  What should I tell my health care provider before I take this medicine? They need to know if you have any of these conditions:  blood disorders  hearing problems  kidney disease  recent or ongoing radiation therapy  an unusual or allergic reaction to carboplatin, cisplatin, other chemotherapy, other medicines, foods, dyes, or preservatives  pregnant or trying to get pregnant  breast-feeding How should I use this medicine? This drug is usually given as an infusion into a vein. It is administered in a hospital or clinic by a specially trained health care professional. Talk to your pediatrician regarding the use of this medicine in children. Special care may be needed. Overdosage: If you think you have taken too much of this medicine contact a poison control center or emergency room at once. NOTE: This medicine is only for you. Do not share this medicine with others. What if I miss a dose? It is important not to miss a dose. Call your doctor or health care professional if you are unable to keep an appointment. What may interact with this medicine?  medicines for seizures  medicines to increase blood counts like filgrastim, pegfilgrastim, sargramostim  some antibiotics like amikacin, gentamicin, neomycin, streptomycin,  tobramycin  vaccines Talk to your doctor or health care professional before taking any of these medicines:  acetaminophen  aspirin  ibuprofen  ketoprofen  naproxen This list may not describe all possible interactions. Give your health care provider a list of all the medicines, herbs, non-prescription drugs, or dietary supplements you use. Also tell them if you smoke, drink alcohol, or use illegal drugs. Some items may interact with your medicine. What should I watch for while using this medicine? Your condition will be monitored carefully while you are receiving this medicine. You will need important blood work done while you are taking this medicine. This drug may make you feel generally unwell. This is not uncommon, as chemotherapy can affect healthy cells as well as cancer cells. Report any side effects. Continue your course of treatment even though you feel ill unless your doctor tells you to stop. In some cases, you may be given additional medicines to help with side effects. Follow all directions for their use. Call your doctor or health care professional for advice if you get a fever, chills or sore throat, or other symptoms of a cold or flu. Do not treat yourself. This drug decreases your body's ability to fight infections. Try to avoid being around people who are sick. This medicine may increase your risk to bruise or bleed. Call your doctor or health care professional if you notice any unusual bleeding. Be careful brushing and flossing your teeth or using a toothpick because you may get an infection or bleed more easily. If you have any dental work done, tell your dentist you are receiving this medicine. Avoid taking products that contain aspirin, acetaminophen, ibuprofen, naproxen, or ketoprofen unless instructed by your doctor. These medicines may hide a fever. Do not become pregnant while taking this medicine. Women should inform their doctor if they wish to become pregnant or  think they might be pregnant. There is a potential for serious side effects to an unborn child. Talk to your health care professional or pharmacist for more information. Do not breast-feed an infant while taking this medicine. What side effects may I notice from receiving this medicine? Side effects that you should report to your doctor or health care professional as soon as possible:  allergic reactions like skin rash, itching  or hives, swelling of the face, lips, or tongue  signs of infection - fever or chills, cough, sore throat, pain or difficulty passing urine  signs of decreased platelets or bleeding - bruising, pinpoint red spots on the skin, black, tarry stools, nosebleeds  signs of decreased red blood cells - unusually weak or tired, fainting spells, lightheadedness  breathing problems  changes in hearing  changes in vision  chest pain  high blood pressure  low blood counts - This drug may decrease the number of white blood cells, red blood cells and platelets. You may be at increased risk for infections and bleeding.  nausea and vomiting  pain, swelling, redness or irritation at the injection site  pain, tingling, numbness in the hands or feet  problems with balance, talking, walking  trouble passing urine or change in the amount of urine Side effects that usually do not require medical attention (report to your doctor or health care professional if they continue or are bothersome):  hair loss  loss of appetite  metallic taste in the mouth or changes in taste This list may not describe all possible side effects. Call your doctor for medical advice about side effects. You may report side effects to FDA at 1-800-FDA-1088. Where should I keep my medicine? This drug is given in a hospital or clinic and will not be stored at home. NOTE: This sheet is a summary. It may not cover all possible information. If you have questions about this medicine, talk to your doctor,  pharmacist, or health care provider.  2021 Elsevier/Gold Standard (2007-07-02 14:38:05)

## 2020-09-03 NOTE — Progress Notes (Signed)
I received a message that the patient's pain is not controlled with tylenol. He previously tried tramadol and norco which was previously too strong for him. I advised her to try cutting those in half for pain control. They still have plenty at home. They will try that and if no improvement, they will call me back. She also had some questions about the biopsy and the molecular studies. I answered her questions to her satisfaction and she was appreciative of the call.

## 2020-09-06 DIAGNOSIS — Z952 Presence of prosthetic heart valve: Secondary | ICD-10-CM | POA: Diagnosis not present

## 2020-09-06 LAB — POCT INR: INR: 1.3 — AB (ref 2.0–3.0)

## 2020-09-07 ENCOUNTER — Ambulatory Visit (HOSPITAL_COMMUNITY)
Admission: RE | Admit: 2020-09-07 | Discharge: 2020-09-07 | Disposition: A | Discharge status: Home / Self Care | Payer: Medicare HMO | Source: Ambulatory Visit | Attending: Interventional Radiology | Admitting: Interventional Radiology

## 2020-09-07 ENCOUNTER — Encounter (HOSPITAL_COMMUNITY): Payer: Self-pay

## 2020-09-07 ENCOUNTER — Telehealth: Payer: Self-pay

## 2020-09-07 ENCOUNTER — Other Ambulatory Visit: Payer: Self-pay

## 2020-09-07 ENCOUNTER — Ambulatory Visit (HOSPITAL_COMMUNITY)
Admission: RE | Admit: 2020-09-07 | Discharge: 2020-09-07 | Disposition: A | Discharge status: Home / Self Care | Payer: Medicare HMO | Source: Ambulatory Visit | Attending: Physician Assistant | Admitting: Physician Assistant

## 2020-09-07 DIAGNOSIS — C3491 Malignant neoplasm of unspecified part of right bronchus or lung: Secondary | ICD-10-CM | POA: Diagnosis not present

## 2020-09-07 DIAGNOSIS — Z87891 Personal history of nicotine dependence: Secondary | ICD-10-CM | POA: Diagnosis not present

## 2020-09-07 DIAGNOSIS — Z7901 Long term (current) use of anticoagulants: Secondary | ICD-10-CM | POA: Diagnosis not present

## 2020-09-07 DIAGNOSIS — I4891 Unspecified atrial fibrillation: Secondary | ICD-10-CM | POA: Insufficient documentation

## 2020-09-07 DIAGNOSIS — Z9889 Other specified postprocedural states: Secondary | ICD-10-CM

## 2020-09-07 DIAGNOSIS — I517 Cardiomegaly: Secondary | ICD-10-CM | POA: Diagnosis not present

## 2020-09-07 DIAGNOSIS — Z9989 Dependence on other enabling machines and devices: Secondary | ICD-10-CM | POA: Insufficient documentation

## 2020-09-07 DIAGNOSIS — Z952 Presence of prosthetic heart valve: Secondary | ICD-10-CM | POA: Diagnosis not present

## 2020-09-07 DIAGNOSIS — Z7982 Long term (current) use of aspirin: Secondary | ICD-10-CM | POA: Diagnosis not present

## 2020-09-07 DIAGNOSIS — C384 Malignant neoplasm of pleura: Secondary | ICD-10-CM | POA: Diagnosis not present

## 2020-09-07 DIAGNOSIS — J948 Other specified pleural conditions: Secondary | ICD-10-CM | POA: Diagnosis not present

## 2020-09-07 DIAGNOSIS — I10 Essential (primary) hypertension: Secondary | ICD-10-CM | POA: Insufficient documentation

## 2020-09-07 DIAGNOSIS — J929 Pleural plaque without asbestos: Secondary | ICD-10-CM | POA: Diagnosis not present

## 2020-09-07 DIAGNOSIS — C782 Secondary malignant neoplasm of pleura: Secondary | ICD-10-CM | POA: Diagnosis not present

## 2020-09-07 DIAGNOSIS — Z79899 Other long term (current) drug therapy: Secondary | ICD-10-CM | POA: Diagnosis not present

## 2020-09-07 DIAGNOSIS — G4733 Obstructive sleep apnea (adult) (pediatric): Secondary | ICD-10-CM | POA: Insufficient documentation

## 2020-09-07 LAB — CBC
HCT: 29 % — ABNORMAL LOW (ref 39.0–52.0)
Hemoglobin: 9.2 g/dL — ABNORMAL LOW (ref 13.0–17.0)
MCH: 30.6 pg (ref 26.0–34.0)
MCHC: 31.7 g/dL (ref 30.0–36.0)
MCV: 96.3 fL (ref 80.0–100.0)
Platelets: 186 10*3/uL (ref 150–400)
RBC: 3.01 MIL/uL — ABNORMAL LOW (ref 4.22–5.81)
RDW: 13.8 % (ref 11.5–15.5)
WBC: 5.6 10*3/uL (ref 4.0–10.5)
nRBC: 0 % (ref 0.0–0.2)

## 2020-09-07 LAB — PROTIME-INR
INR: 1.4 — ABNORMAL HIGH (ref 0.8–1.2)
Prothrombin Time: 17.1 seconds — ABNORMAL HIGH (ref 11.4–15.2)

## 2020-09-07 MED ORDER — MIDAZOLAM HCL 2 MG/2ML IJ SOLN
INTRAMUSCULAR | Status: AC
  Filled 2020-09-07: qty 2

## 2020-09-07 MED ORDER — MIDAZOLAM HCL 2 MG/2ML IJ SOLN
INTRAMUSCULAR | Status: AC | PRN
  Administered 2020-09-07: 1 mg via INTRAVENOUS

## 2020-09-07 MED ORDER — LIDOCAINE HCL 1 % IJ SOLN
INTRAMUSCULAR | Status: AC
  Filled 2020-09-07: qty 20

## 2020-09-07 MED ORDER — FENTANYL CITRATE (PF) 100 MCG/2ML IJ SOLN
INTRAMUSCULAR | Status: AC
  Filled 2020-09-07: qty 2

## 2020-09-07 MED ORDER — SODIUM CHLORIDE 0.9 % IV SOLN: INTRAVENOUS | Status: DC

## 2020-09-07 MED ORDER — FENTANYL CITRATE (PF) 100 MCG/2ML IJ SOLN
INTRAMUSCULAR | Status: AC | PRN
  Administered 2020-09-07 (×2): 25 ug via INTRAVENOUS

## 2020-09-07 NOTE — Sedation Documentation (Signed)
Patient is resting. Procedure started

## 2020-09-07 NOTE — Procedures (Signed)
Interventional Radiology Procedure Note  Procedure: CT guided biopsy  Indication: Right pleural mass  Findings: Please refer to procedural dictation for full description.  Complications: None  EBL: < 10 mL  Miachel Roux, MD [phone]

## 2020-09-07 NOTE — Telephone Encounter (Signed)
See other encounter.

## 2020-09-07 NOTE — Sedation Documentation (Signed)
Vital signs stable. 

## 2020-09-07 NOTE — Sedation Documentation (Signed)
Transported pt to ct 3 via stretcher. Pt moved self from stretcher via standing position to ct table without difficulty to prone position on ct table. Applied monitor, initial scans taken. Pt states he is mildly uncomfortable in prone position but not in pain. Will continue to monitor. Vitals stable

## 2020-09-07 NOTE — Telephone Encounter (Signed)
Called in and stated that they needed to talk w/kristin alvstad regarding a biopsy so I will route to her

## 2020-09-07 NOTE — Sedation Documentation (Addendum)
Pt tolerated procedure very well. Pt assisted back to stretcher post procedure with no complaints. Pt is drowsy but arousable. Vitals stable. Report given to [facility], RN. Pt placed in transport for travel to [facility] for recovery.  Totals: Time: 17 mins Fentanyl 44mcg Versed 1 mg

## 2020-09-07 NOTE — Progress Notes (Signed)
Deatra Canter, [place] paged on when to resume warfarin. Pt may resume tonight 5/31.

## 2020-09-07 NOTE — Telephone Encounter (Signed)
Wife called earlier today, returned her call.  She states she was told to hold lovenox and just give warfarin.  Will reach out to Dr. Julien Nordmann to clarify this - likely concern for bleed risk.  Will notify Mrs Cazeau once I get clarification.

## 2020-09-07 NOTE — Sedation Documentation (Signed)
Images taken

## 2020-09-07 NOTE — Discharge Instructions (Addendum)
Lung Biopsy, Care After This information will help you take care of yourself after your procedure. Your health care provider may also give you more specific instructions depending on the type of biopsy you had. If you have problems or questions, contact your health care provider. What can I expect after the procedure? After the procedure, it is common to have:  A cough.  A sore throat.  Pain where a needle or incision was used to collect a biopsy sample (biopsy site). Follow these instructions at home: Medicines  Take over-the-counter and prescription medicines only as told by your health care provider.  Talk to your health care provider before you take any medicines that contain aspirin or NSAIDS, such as ibuprofen. These medicines can increase your risk of bleeding.  Ask your health care provider if the medicine prescribed to you: ? Requires you to avoid driving or using machinery. ? Can cause constipation. You may need to take these actions to prevent or treat constipation:  Drink enough fluid to keep your urine pale yellow.  Take over-the-counter or prescription medicines.  Eat foods that are high in fiber, such as beans, whole grains, fresh fruits and vegetables.  Limit foods that are high in fat and processed sugars, such as fried or sweet foods. Biopsy site care  If you had a needle or open biopsy, follow instructions from your health care provider about how to take care of your biopsy site. Make sure you: ? Wash your hands with soap and water for at least 20 seconds before and after you change your bandage (dressing). If soap and water are not available, use hand sanitizer. ? Remove your dressing in 24 hours. ? Leave stitches (sutures), skin glue, or adhesive strips in place for as long as you are told. If adhesive strip edges start to loosen and curl up, you may trim the loose edges. Do not remove adhesive strips completely unless your health care provider tells you to do  that.  Do not take baths, swim, or use a hot tub until your health care provider approves. Ask your health care provider if you may take showers. You may shower in 24 hours  Check your biopsy site every day for signs of infection. Check for: ? Redness, swelling, or more pain. ? Fluid or blood. ? Warmth. ? Pus or a bad smell.   General instructions  Do not drink alcohol if your health care provider tells you not to drink.  If you were given a sedative during the procedure, it can affect you for several hours. Do not drive or operate machinery until your health care provider says that it is safe.  Return to your normal activities as told by your health care provider. Ask your health care provider what activities are safe for you.  It is up to you to get the results of your procedure. Ask your health care provider, or the department that is doing the procedure, when your results will be ready.  Keep all follow-up visits as told by your health care provider. This is important. Contact a health care provider if:  You have a fever.  You have redness, swelling, or more pain around your biopsy site.  You have fluid or blood coming from your biopsy site.  Your biopsy site feels warm to the touch.  You have pus or a bad smell coming from your biopsy site.  You have pain that does not get better with medicine. Get help right away if:  You  cough up blood.  You have trouble breathing.  You have chest pain.  You lose consciousness. These symptoms may represent a serious problem that is an emergency. Do not wait to see if the symptoms will go away. Get medical help right away. Call your local emergency services (911 in the [place] Do not drive yourself to the hospital. Summary  It is common to have some pain where a needle or incision was used to collect a biopsy sample (biopsy site).  Return to your normal activities as told by your health care provider. Ask your health care provider  what activities are safe for you.  Take over-the-counter and prescription medicines only as told by your health care provider.  Report any unusual symptoms to your health care provider. This information is not intended to replace advice given to you by your health care provider. Make sure you discuss any questions you have with your health care provider. Document Revised: 03/08/2019 Document Reviewed: 03/08/2019 Elsevier Patient Education  2021 [place].   Moderate Conscious Sedation, Adult, Care After This sheet gives you information about how to care for yourself after your procedure. Your health care provider may also give you more specific instructions. If you have problems or questions, contact your health care provider. What can I expect after the procedure? After the procedure, it is common to have:  Sleepiness for several hours.  Impaired judgment for several hours.  Difficulty with balance.  Vomiting if you eat too soon. Follow these instructions at home: For the time period you were told by your health care provider: 24 hours  Rest.  Do not participate in activities where you could fall or become injured.  Do not drive or use machinery.  Do not drink alcohol.  Do not take sleeping pills or medicines that cause drowsiness.  Do not make important decisions or sign legal documents.  Do not take care of children on your own.      Eating and drinking  Follow the diet recommended by your health care provider.  Drink enough fluid to keep your urine pale yellow.  If you vomit: ? Drink water, juice, or soup when you can drink without vomiting. ? Make sure you have little or no nausea before eating solid foods.   General instructions  Take over-the-counter and prescription medicines only as told by your health care provider.  Have a responsible adult stay with you for the time you are told. It is important to have someone help care for you until you are awake  and alert.  Do not smoke.  Keep all follow-up visits as told by your health care provider. This is important. Contact a health care provider if:  You are still sleepy or having trouble with balance after 24 hours.  You feel light-headed.  You keep feeling nauseous or you keep vomiting.  You develop a rash.  You have a fever.  You have redness or swelling around the IV site. Get help right away if:  You have trouble breathing.  You have new-onset confusion at home. Summary  After the procedure, it is common to feel sleepy, have impaired judgment, or feel nauseous if you eat too soon.  Rest after you get home. Know the things you should not do after the procedure.  Follow the diet recommended by your health care provider and drink enough fluid to keep your urine pale yellow.  Get help right away if you have trouble breathing or new-onset confusion at home. This  information is not intended to replace advice given to you by your health care provider. Make sure you discuss any questions you have with your health care provider. Document Revised: 07/25/2019 Document Reviewed: 02/20/2019 Elsevier Patient Education  2021 [employer].

## 2020-09-07 NOTE — Sedation Documentation (Signed)
Biopsies taken °

## 2020-09-07 NOTE — H&P (Signed)
Chief Complaint: Patient was seen in consultation today for right pleural space soft tissue thickening/biopsy.  Referring Physician(s): [name],[name] (oncology)  Supervising Physician: Mir, Sharen Heck  Patient Status: [name] - Out-pt  History of Present Illness: Marcus [name] is a 75 y.o. male with a past medical history of hypertension, atrial fibrillation, mechanical heart valve, lung cancer, and OSA on CPAP. He is known to IR- he underwent a right thoracentesis in IR 08/05/2020. He was unfortunately diagnosed with non-small cell lung cancer (adenocarcinoma) stage IIIb in 11/2019. His cancer is managed by [facility], PA-C and Dr. Earlie Server. He underwent a right upper lobectomy in OR 12/09/2019 by Dr. Kipp Brood. He is currently undergoing systemic chemotherapy as management. Follow-up imaging revealed right pleural space soft tissue thickening, suspicious for metastatic disease.  NM PET 08/05/2020: 1. Signs of extensive pleural involvement, likely extending into extrapleural fat, in the RIGHT chest compatible with disease recurrence and associated with nodal disease in the mediastinum. 2. 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 3. Decreased pleural fluid compared to August 01, 2020 following thoracentesis. 4. Equivocal uptake in the LEFT adrenal gland, attention on follow-up. Adrenal thickening is unchanged dating back to 2021 and is only very mild. 5. Focal uptake in the prostate. Correlation with PSA may be helpful. Findings are nonspecific and could reflect prostatitis but could also be seen in the setting of prostate cancer. 6. Aortic atherosclerosis with aneurysmal dilation of the iliac vasculature similar to prior imaging.  CTA chest 08/01/2020: 1. No evidence for acute pulmonary embolus. 2. New moderate loculated right pleural effusion with mild pleural soft tissue thickening overlying the apical portions of the right lung. Etiology is  indeterminate. If there is a clinical concern for pleural spread of disease consider further evaluation with diagnostic thoracentesis. 3. No acute findings within the abdomen or pelvis. 4. Emphysema and aortic atherosclerosis.  IR consulted by [facility], PA-C for possible image-guided right pleural space soft tissue thickening biopsy. Patient awake and alert sitting in bed. Complains of mild right chest pain, stable, chronic, secondary to cancer. Denies fever, chills, dyspnea, abdominal pain, or headache.  LD Coumadin yesterday evening per wife.   Past Medical History:  Diagnosis Date  . Atrial fibrillation ([place])   . Dyspnea    with exertion   . Dysrhythmia   . Hypertension   . Obstructive sleep apnea 10/25/2007   cpap  . S/P AVR    2D ECHO, 04/25/2011 - EF >55%, Right ventricle-mild-moderately dilated  . Swelling of limb    LEA VENOUS, 08/22/2009 - no evidence of deep vein or superficial thrombosis; partially rupturing Baker's Cyst    Past Surgical History:  Procedure Laterality Date  . CARDIAC CATHETERIZATION Bilateral 05/10/2007   Significant 1-vessel disease, severely dilated aortic root with moderate severe aortic insufficiency  . CARDIAC SURGERY    . CARDIOVERSION  08/02/2007   150 Joule biphasic shock with restoration of sinus rhythm. Heart rate 60.  . cataract surgery     . COLONOSCOPY WITH PROPOFOL N/A 12/15/2016   Procedure: COLONOSCOPY WITH PROPOFOL;  Surgeon: Carol Ada, MD;  Location: [facility];  Service: Endoscopy;  Laterality: N/A;  . EYE SURGERY    . INTERCOSTAL NERVE BLOCK Right 12/08/2019   Procedure: INTERCOSTAL NERVE BLOCK;  Surgeon: [name], MD;  Location: [place];  Service: Thoracic;  Laterality: Right;  . IR THORACENTESIS ASP PLEURAL SPACE W/IMG GUIDE  08/05/2020  . NM MYOVIEW LTD  04/08/2007   No evidence of inducible  myocardial ischemia  . NODE DISSECTION Right 12/08/2019   Procedure: NODE DISSECTION;  Surgeon: [name], MD;  Location: [place];  Service: Thoracic;  Laterality: Right;  . THYROIDECTOMY, PARTIAL    . torn meniscus in right knee surgery       Allergies: Amlodipine, Sulfa antibiotics, Tetanus toxoids, Crestor [rosuvastatin], and Penicillins  Medications: Prior to Admission medications   Medication Sig Start Date End Date Taking? Authorizing Provider  acetaminophen (TYLENOL) 500 MG tablet Take 500-1,000 mg by mouth every 6 (six) hours as needed for moderate pain.   Yes [provider]  ALPRAZolam (XANAX) 0.25 MG tablet Take 1 tablet (0.25 mg total) by mouth every 8 (eight) hours as needed for up to 20 doses for anxiety. 08/13/20  Yes [name], MD  aspirin EC 81 MG tablet Take 81 mg by mouth daily.   Yes [provider]  atorvastatin (LIPITOR) 80 MG tablet TAKE 1 TABLET BY MOUTH EVERY DAY Patient taking differently: Take 80 mg by mouth daily. 07/28/20  Yes Troy Sine, MD  cetirizine (ZYRTEC) 10 MG tablet Take 10 mg by mouth daily.   Yes [provider]  folic acid (FOLVITE) 1 MG tablet Take 1 tablet (1 mg total) by mouth daily. 08/25/20  Yes [name], [name], PA-C  Multiple Vitamins-Minerals (EYE SUPPORT PO) Take 1 tablet by mouth daily. preservision   Yes [provider]  NONFORMULARY OR COMPOUNDED ITEM Apply 2 sprays topically as directed. Testosterone Topical  Preparation 100 mg (Compounded by [place])  Apply 2 clicks (50 mg) daily   Yes [provider]  prochlorperazine (COMPAZINE) 10 MG tablet Take 1 tablet (10 mg total) by mouth every 6 (six) hours as needed. 08/25/20  Yes [name], [name], PA-C  ramipril (ALTACE) 10 MG capsule TAKE 1 CAPSULE BY MOUTH EVERY DAY Patient taking differently: Take 10 mg by mouth daily. 08/10/20  Yes [name], Mihai, MD  warfarin (COUMADIN) 10 MG tablet TAKE 1 TO 1 AND 1/2 TABLETS DAILY AS DIRECTED BY COUMADIN CLINIC Patient taking differently: Take 10 mg  by mouth daily at 4 PM. 02/27/20  Yes [name], Mihai, MD  acetaminophen (TYLENOL) 325 MG tablet Take 2 tablets (650 mg total) by mouth every 6 (six) hours as needed for mild pain. Patient not taking: Reported on 08/31/2020 12/18/19   Barrett, Erin R, PA-C  enoxaparin (LOVENOX) 120 MG/0.8ML injection Inject 0.8 mLs (120 mg total) into the skin daily. In the evening. 09/03/20   [name], [name], MD     Family History  Problem Relation Age of Onset  . Cancer Father     Social History   Socioeconomic History  . Marital status: Married    Spouse name: diane  . Number of children: Not on file  . Years of education: Not on file  . Highest education level: Not on file  Occupational History  . Occupation: retired  Tobacco Use  . Smoking status: Former Smoker    Types: Cigarettes    Quit date: 04/11/1987    Years since quitting: 33.4  . Smokeless tobacco: Never Used  Vaping Use  . Vaping Use: Never used  Substance and Sexual Activity  . Alcohol use: No  . Drug use: No  . Sexual activity: Not on file  Other Topics Concern  . Not on file  Social History Narrative  . Not on file   Social Determinants of Health   Financial Resource Strain: Low Risk   .  Difficulty of Paying Living Expenses: Not hard at all  Food Insecurity: No Food Insecurity  . Worried About Charity fundraiser in the Last Year: Never true  . Ran Out of Food in the Last Year: Never true  Transportation Needs: No Transportation Needs  . Lack of Transportation (Medical): No  . Lack of Transportation (Non-Medical): No  Physical Activity: Insufficiently Active  . Days of Exercise per Week: 3 days  . Minutes of Exercise per Session: 40 min  Stress: No Stress Concern Present  . Feeling of Stress : Not at all  Social Connections: Moderately Integrated  . Frequency of Communication with Friends and Family: More than three times a week  . Frequency of Social Gatherings with Friends and Family: Never  . Attends Religious  Services: 1 to 4 times per year  . Active Member of Clubs or Organizations: No  . Attends Archivist Meetings: Never  . Marital Status: Married     Review of Systems: A 12 point ROS discussed and pertinent positives are indicated in the HPI above.  All other systems are negative.  Review of Systems  Constitutional: Negative for chills and fever.  Respiratory: Negative for shortness of breath and wheezing.   Cardiovascular: Positive for chest pain. Negative for palpitations.  Gastrointestinal: Negative for abdominal pain.  Neurological: Negative for headaches.  Psychiatric/Behavioral: Negative for behavioral problems and confusion.    Vital Signs: BP (!) 110/57   Pulse 78   Temp 98.8 F (37.1 C) (Oral)   Resp 16   Ht 5\' 11"  (1.803 m)   Wt 179 lb (81.2 kg)   BMI 24.97 kg/m   Physical Exam Vitals and nursing note reviewed.  Constitutional:      General: He is not in acute distress. Cardiovascular:     Comments: Irregular rate, irregular rhythm. Pulmonary:     Effort: Pulmonary effort is normal. No respiratory distress.     Breath sounds: Normal breath sounds. No wheezing.  Skin:    General: Skin is warm and dry.  Neurological:     Mental Status: He is alert and oriented to person, place, and time.      MD Evaluation Airway: WNL Heart: WNL Abdomen: WNL Chest/ Lungs: WNL ASA  Classification: 3 Mallampati/Airway Score: Two   Imaging: DG Chest 2 View  Result Date: 09/01/2020 CLINICAL DATA:  75 year old male with a history of chest pain. Imaging history thoracic metastatic disease, pleural involvement, carcinomatosis EXAM: CHEST - 2 VIEW COMPARISON:  PET-CT 08/20/2020 FINDINGS: Cardiomediastinal silhouette unchanged. Surgical changes of median sternotomy, CABG, aortic valve repair. Persisting opacity at the right lung base obscuring the right hemidiaphragm and the right heart border. Pleuroparenchymal thickening of the right-sided pleural interface.  Improved appearance of the lentiform opacities in the right mid lung and infrahilar region, with persistent thickening of the fissure. Reticular opacities throughout the bilateral lungs, similar to the prior. No evidence of interlobular septal thickening to indicate edema. No pneumothorax. Left lung relatively unchanged with no new confluent airspace disease. Degenerative changes of the spine.  No acute displaced fracture. IMPRESSION: No acute change in the appearance of the chest x-ray, with plain film demonstration of thoracic metastatic disease better characterized on recent PET-CT, including extensive right-sided pleural involvement potentially a source of pain. Surgical changes of median sternotomy, CABG, aortic valve repair Electronically Signed   By: Corrie Mckusick D.O.   On: 09/01/2020 09:36   MR Brain W Wo Contrast  Result Date: 09/03/2020 CLINICAL DATA:  Non-small-cell lung cancer staging EXAM: MRI HEAD WITHOUT AND WITH CONTRAST TECHNIQUE: Multiplanar, multiecho pulse sequences of the brain and surrounding structures were obtained without and with intravenous contrast. CONTRAST:  84mL GADAVIST GADOBUTROL 1 MMOL/ML IV SOLN COMPARISON:  MRI head 12/18/2019 FINDINGS: Brain: Negative for metastatic disease to brain. No enhancing mass lesion is present. Generalized atrophy. Extensive white matter changes with periventricular deep white matter hyperintensities bilaterally, comparable to the prior study. Small chronic infarct right cerebellum. Brainstem intact. Negative for acute infarct Innumerable areas of microhemorrhage throughout both cerebral hemispheres. There is evidence of prior subarachnoid hemorrhage with superficial siderosis in the posterior fossa and around both cerebral hemispheres. Findings are unchanged from the prior study. Vascular: Normal arterial flow voids Skull and upper cervical spine: No focal skeletal abnormality. Sinuses/Orbits: Paranasal sinuses clear. Bilateral cataract extraction  Other: None IMPRESSION: Negative for metastatic disease to brain Generalized atrophy. Extensive chronic microvascular ischemic changes. No acute infarct Innumerable areas of chronic microhemorrhage in both cerebral hemispheres with evidence of superficial siderosis, unchanged from the prior study. Findings compatible with cerebral amyloid. Electronically Signed   By: Franchot Gallo M.D.   On: 09/03/2020 16:45   NM PET Image Restag (PS) Skull Base To Thigh  Result Date: 08/21/2020 CLINICAL DATA:  Subsequent treatment strategy for non-small cell lung cancer, assess treatment response in the setting of new pleural fluid/nodularity. EXAM: NUCLEAR MEDICINE PET SKULL BASE TO THIGH TECHNIQUE: 8.6 mCi F-18 FDG was injected intravenously. Full-ring PET imaging was performed from the skull base to thigh after the radiotracer. CT data was obtained and used for attenuation correction and anatomic localization. Fasting blood glucose: 90 mg/dl COMPARISON:  Multiple prior studies most recent from April of 2022 and PET exams dating back to January of 2021. FINDINGS: Mediastinal blood pool activity: SUV max 2.76 Liver activity: SUV max NA NECK: No hypermetabolic lymph nodes in the neck. Incidental CT findings: none CHEST: Near circumferential pleural thickening in the chest with signs of nodularity, most pronounced on image 100 of series 4, this area measuring 7.7 x 2.3 cm with a maximum SUV of 6.6. Subtle pleural thickening elsewhere with increased metabolic activity, for instance on image 55 of series 4 maximum SUV of 8.6 with respect to pleural thickening at the lung apex. Increased pleural thickening adjacent to previous mediastinal activity that is since been resected measuring 7 mm on image 73 of series 4 with a maximum SUV of 6.2. Intense FDG uptake tracks along the RIGHT lateral and anterior inferior chest into the cardiophrenic recess and along the RIGHT hemidiaphragm. Maximum SUV just lateral to the RIGHT cardiophrenic  recess 8.4. Thickening near the distal esophagus with infiltration of the fat also showing increased metabolic activity. (Image 82/4) 11 mm subcarinal lymph node with a maximum SUV of 5.7. Mildly enlarged RIGHT paratracheal lymph node (image 74/4) mild FDG uptake measuring 8 mm short axis with a maximum SUV of 4.1. Incidental CT findings: Post median sternotomy. Signs of aortic valve replacement and CABG. Normal heart size without substantial pericardial effusion. Normal caliber central pulmonary vasculature. Limited assessment of cardiovascular structures given lack of intravenous contrast. Septal thickening, irregular septal thickening at the RIGHT lung apex with changes of RIGHT upper lobectomy as before. Septal thickening is a new finding based on comparison with imaging from February. No dense consolidation. Airways are patent. ABDOMEN/PELVIS: Disease tracks into the most inferior aspect of the costodiaphragmatic recess, no definite signs of disease however noted in the abdomen. Minimal thickening of the LEFT adrenal gland  appears similar to prior imaging. Mildly increased FDG uptake in this area is similar to background liver, only slightly above background liver with a maximum SUV of 3.6 Incidental CT findings: No acute findings relative to liver, gallbladder, spleen, pancreas, adrenal glands and kidneys. Small cyst in the interpolar RIGHT kidney. Urinary bladder decompressed. No acute gastrointestinal finding.  Appendix is normal. Extensive aortic atheromatous plaque. Dilated iliac vasculature with similar appearance, aneurysmal caliber of the LEFT common iliac artery at the internal external bifurcation 2.6 cm similar to previous imaging. Prostate unremarkable by CT though with focal area of FDG uptake in the posterolateral LEFT hemi prostate with an SUV of 5.4 in the approximate location of the mid gland (image 193/3) SKELETON: No definite signs of skeletal metastatic disease though chest wall involvement  on the RIGHT is suspected and areas of FDG uptake are closely associated with adjacent ribs. Incidental CT findings: There is involvement however likely of extrapleural fat in the RIGHT chest due to circumferential pleural involvement. IMPRESSION: Signs of extensive pleural involvement, likely extending into extrapleural fat, in the RIGHT chest compatible with disease recurrence and associated with nodal disease in the mediastinum. 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 Decreased pleural fluid compared to August 01, 2020 following thoracentesis. Equivocal uptake in the LEFT adrenal gland, attention on follow-up. Adrenal thickening is unchanged dating back to 2021 and is only very mild. Focal uptake in the prostate. Correlation with PSA may be helpful. Findings are nonspecific and could reflect prostatitis but could also be seen in the setting of prostate cancer. Aortic atherosclerosis with aneurysmal dilation of the iliac vasculature similar to prior imaging. Aortic Atherosclerosis (ICD10-I70.0). Electronically Signed   By: Zetta Bills M.D.   On: 08/21/2020 09:32    Labs:  CBC: Recent Labs    08/01/20 1134 08/13/20 0951 09/03/20 0825 09/07/20 0643  WBC 5.8 8.8 6.1 5.6  HGB 10.0* 9.7* 9.0* 9.2*  HCT 30.7* 28.6* 28.1* 29.0*  PLT 187 324.0 209 186    COAGS: Recent Labs    12/04/19 1300 12/09/19 0101 08/22/20 0809 08/30/20 1224 09/03/20 0000 09/07/20 0643  INR 1.2   < > 4.4* 5.9* 2.8 1.4*  APTT 49*  --   --   --   --   --    < > = values in this interval not displayed.    BMP: Recent Labs    12/09/19 0101 12/10/19 0114 12/15/19 0158 01/01/20 1227 01/28/20 1324 05/07/20 0924 05/14/20 1336 08/01/20 1134 08/13/20 0951 09/03/20 0825  NA 138 138 137 140   < > 139 140 136 138 139  K 3.9 4.1 4.4 4.3   < > 5.2* 5.2* 4.7 4.5 3.8  CL 106 103 104 106   < > 106 108 103 102 105  CO2 23 27 27 31    < > 28 25 27 27 25   GLUCOSE 130* 98 108* 81   < > 66* 95 94 90  115*  BUN 10 10 12 13    < > 19 17 16 21 15   CALCIUM 8.0* 8.1* 7.9* 8.8*   < > 8.7* 8.8* 8.7* 8.5 8.7*  CREATININE 0.81 1.06 0.74 0.84   < > 1.10 0.94 0.94 0.92 0.88  GFRNONAA >60 >60 >60 >60   < > >60 >60 >60  --  >60  GFRAA >60 >60 >60 >60  --   --   --   --   --   --    < > = values  in this interval not displayed.    LIVER FUNCTION TESTS: Recent Labs    05/14/20 1336 08/01/20 1134 08/13/20 0951 09/03/20 0825  BILITOT 0.6 0.8 0.5 0.5  AST 28 26 21 27   ALT 15 14 12 18   ALKPHOS 71 71 108 114  PROT 6.7 7.1 6.8 7.0  ALBUMIN 4.1 3.7 3.7 2.9*     Assessment and Plan:  Right pleural space soft tissue thickening in setting of known non-small cell lung cancer, suspicious for metastatic disease. Plan for image-guided right pleural space soft tissue thickening biopsy today in IR. Patient has also been consented for possible chest tube placement given biopsy of pleural space. Patient is NPO. Afebrile and WBCs WNL. On Coumadin/Aspirin- was bridging with Lovenox and had Aspirin held per IR protocol, however was given one dose of Coumadin last evening. INR 1.4 today. Platelets WNL. Discussed case with Dr. Dwaine Gale who states ok to proceed today given above.  Risks and benefits discussed with the patient including, but not limited to bleeding, hemoptysis, respiratory failure requiring intubation, infection, pneumothorax requiring chest tube placement, stroke from air embolism or even death. All of the patient's questions were answered, patient is agreeable to proceed. Consent signed and in chart.   Thank you for this interesting consult.  I greatly enjoyed meeting [facility]S and look forward to participating in their care.  A copy of this report was sent to the requesting provider on this date.  Electronically Signed: Earley Abide, PA-C 09/07/2020, 7:21 AM   I spent a total of 25 Minutes in face to face in clinical consultation, greater than 50% of which was counseling/coordinating care  for right pleural space soft tissue thickening/biopsy.

## 2020-09-07 NOTE — Sedation Documentation (Signed)
Patient is resting comfortably. Procedure continues

## 2020-09-08 ENCOUNTER — Ambulatory Visit (INDEPENDENT_AMBULATORY_CARE_PROVIDER_SITE_OTHER): Payer: Medicare HMO | Admitting: Cardiovascular Disease

## 2020-09-08 ENCOUNTER — Other Ambulatory Visit: Payer: Medicare HMO

## 2020-09-08 ENCOUNTER — Telehealth: Payer: Self-pay | Admitting: Physician Assistant

## 2020-09-08 DIAGNOSIS — Z79899 Other long term (current) drug therapy: Secondary | ICD-10-CM | POA: Diagnosis not present

## 2020-09-08 DIAGNOSIS — Z952 Presence of prosthetic heart valve: Secondary | ICD-10-CM

## 2020-09-08 LAB — SURGICAL PATHOLOGY

## 2020-09-08 NOTE — Telephone Encounter (Signed)
Curt Bears, MD  Rockne Menghini, RPH-CPP Cc: [name], MD I sent the recommendation regarding holding his Coumadin and bridging with Lovenox was done by Dr. Loletha Grayer. Please follow his recommendation. thank you    Spoke with wife 6.1.22.  Aware to continue with bridging plan

## 2020-09-08 NOTE — Telephone Encounter (Signed)
At the patient's last visit with me, his wife mentioned he is a hard stick. If his guardant 360 came back negative for targeted treatment, then I discussed he may benefit from a port-a-cath. Additionally, he received 1 cycle of chemotherapy last week and the RN messaged me and let me know he is a challenging stick. I recieved the official results from [facility]. Unfortunately, he is not a candidate for targeted treatment. Therefore, we will continue with IV chemotherapy. I called his wife and discussed a port-a-cath to see if they would like to move forward with that. His next treatment is in 2 weeks. She is going to discuss with her husband and will let me know if she would like me to place the order. If so, we will place the order and send the prescription for the numbing cream.

## 2020-09-09 ENCOUNTER — Other Ambulatory Visit: Payer: Self-pay | Admitting: Physician Assistant

## 2020-09-09 ENCOUNTER — Inpatient Hospital Stay: Payer: Medicare HMO | Attending: Internal Medicine

## 2020-09-09 ENCOUNTER — Ambulatory Visit (HOSPITAL_COMMUNITY): Payer: Medicare HMO | Attending: Cardiology

## 2020-09-09 ENCOUNTER — Telehealth: Payer: Self-pay | Admitting: Medical Oncology

## 2020-09-09 ENCOUNTER — Other Ambulatory Visit: Payer: Self-pay

## 2020-09-09 DIAGNOSIS — Z79899 Other long term (current) drug therapy: Secondary | ICD-10-CM | POA: Diagnosis not present

## 2020-09-09 DIAGNOSIS — Z5111 Encounter for antineoplastic chemotherapy: Secondary | ICD-10-CM | POA: Diagnosis present

## 2020-09-09 DIAGNOSIS — D6481 Anemia due to antineoplastic chemotherapy: Secondary | ICD-10-CM | POA: Diagnosis not present

## 2020-09-09 DIAGNOSIS — Z5112 Encounter for antineoplastic immunotherapy: Secondary | ICD-10-CM | POA: Diagnosis present

## 2020-09-09 DIAGNOSIS — C3411 Malignant neoplasm of upper lobe, right bronchus or lung: Secondary | ICD-10-CM | POA: Diagnosis present

## 2020-09-09 DIAGNOSIS — C3490 Malignant neoplasm of unspecified part of unspecified bronchus or lung: Secondary | ICD-10-CM

## 2020-09-09 DIAGNOSIS — Z952 Presence of prosthetic heart valve: Secondary | ICD-10-CM

## 2020-09-09 DIAGNOSIS — Z452 Encounter for adjustment and management of vascular access device: Secondary | ICD-10-CM | POA: Insufficient documentation

## 2020-09-09 DIAGNOSIS — C3491 Malignant neoplasm of unspecified part of right bronchus or lung: Secondary | ICD-10-CM

## 2020-09-09 DIAGNOSIS — T451X5A Adverse effect of antineoplastic and immunosuppressive drugs, initial encounter: Secondary | ICD-10-CM | POA: Insufficient documentation

## 2020-09-09 LAB — CMP (CANCER CENTER ONLY)
ALT: 19 U/L (ref 0–44)
AST: 28 U/L (ref 15–41)
Albumin: 2.8 g/dL — ABNORMAL LOW (ref 3.5–5.0)
Alkaline Phosphatase: 113 U/L (ref 38–126)
Anion gap: 9 (ref 5–15)
BUN: 21 mg/dL (ref 8–23)
CO2: 24 mmol/L (ref 22–32)
Calcium: 8.2 mg/dL — ABNORMAL LOW (ref 8.9–10.3)
Chloride: 102 mmol/L (ref 98–111)
Creatinine: 0.91 mg/dL (ref 0.61–1.24)
GFR, Estimated: >60 mL/min (ref >60)
Glucose, Bld: 98 mg/dL (ref 70–99)
Potassium: 4.1 mmol/L (ref 3.5–5.1)
Sodium: 135 mmol/L (ref 135–145)
Total Bilirubin: 0.6 mg/dL (ref 0.3–1.2)
Total Protein: 6.7 g/dL (ref 6.5–8.1)

## 2020-09-09 LAB — CBC WITH DIFFERENTIAL (CANCER CENTER ONLY)
Abs Immature Granulocytes: 0.02 10*3/uL (ref 0.00–0.07)
Basophils Absolute: 0 10*3/uL (ref 0.0–0.1)
Basophils Relative: 1 %
Eosinophils Absolute: 0 10*3/uL (ref 0.0–0.5)
Eosinophils Relative: 1 %
HCT: 26.7 % — ABNORMAL LOW (ref 39.0–52.0)
Hemoglobin: 8.5 g/dL — ABNORMAL LOW (ref 13.0–17.0)
Immature Granulocytes: 1 %
Lymphocytes Relative: 13 %
Lymphs Abs: 0.5 10*3/uL — ABNORMAL LOW (ref 0.7–4.0)
MCH: 29.8 pg (ref 26.0–34.0)
MCHC: 31.8 g/dL (ref 30.0–36.0)
MCV: 93.7 fL (ref 80.0–100.0)
Monocytes Absolute: 0.2 10*3/uL (ref 0.1–1.0)
Monocytes Relative: 4 %
Neutro Abs: 3.1 10*3/uL (ref 1.7–7.7)
Neutrophils Relative %: 80 %
Platelet Count: 147 10*3/uL — ABNORMAL LOW (ref 150–400)
RBC: 2.85 MIL/uL — ABNORMAL LOW (ref 4.22–5.81)
RDW: 13.7 % (ref 11.5–15.5)
WBC Count: 3.8 10*3/uL — ABNORMAL LOW (ref 4.0–10.5)
nRBC: 0 % (ref 0.0–0.2)

## 2020-09-09 LAB — ECHOCARDIOGRAM COMPLETE
AR max vel: 1.21 cm2
AV Area VTI: 1.41 cm2
AV Area mean vel: 1.28 cm2
AV Mean grad: 13.3 mmHg
AV Peak grad: 31.9 mmHg
Ao pk vel: 2.82 m/s
Area-P 1/2: 2.91 cm2
S' Lateral: 2.3 cm

## 2020-09-09 LAB — GUARDANT 360

## 2020-09-09 NOTE — Telephone Encounter (Signed)
Uncontrolled Pain-Wife called  "he is having a lot of uncontrolled pain -he can't sleep. His chest , back and  BM BX site hurt. He needs something for his pain. He does not tolerate tramadol, Hydrocodone and lidocaine patch do not help".  Will ask [name] to please advise.

## 2020-09-10 ENCOUNTER — Encounter: Payer: Self-pay | Admitting: Internal Medicine

## 2020-09-10 NOTE — Telephone Encounter (Signed)
Spoke with Mrs [place]. States he does not want to take try the Morphine or Fentanyl patched. Think they might be too strong. Is continuing to have severe pain with deep breath. Is taking a Tylenol 500 mg tablet ~ 5 times per day. Discussed taking 1/2 of tramadol or 1/2 of hydrocodone to see how he tolerates it. Wife states one pill makes him nauseated. Discussed taking compazine 30 minutes prior to pain pill. She will try this over the weekend to see what might help the pain.

## 2020-09-13 ENCOUNTER — Ambulatory Visit (INDEPENDENT_AMBULATORY_CARE_PROVIDER_SITE_OTHER): Payer: Medicare HMO | Admitting: Pharmacist Clinician (PhC)/ Clinical Pharmacy Specialist

## 2020-09-13 DIAGNOSIS — I4891 Unspecified atrial fibrillation: Secondary | ICD-10-CM

## 2020-09-13 DIAGNOSIS — Z79899 Other long term (current) drug therapy: Secondary | ICD-10-CM

## 2020-09-13 DIAGNOSIS — I9789 Other postprocedural complications and disorders of the circulatory system, not elsewhere classified: Secondary | ICD-10-CM

## 2020-09-13 DIAGNOSIS — Z952 Presence of prosthetic heart valve: Secondary | ICD-10-CM | POA: Diagnosis not present

## 2020-09-13 DIAGNOSIS — Z7901 Long term (current) use of anticoagulants: Secondary | ICD-10-CM

## 2020-09-13 LAB — POCT INR: INR: 1.1 — AB (ref 2.0–3.0)

## 2020-09-15 ENCOUNTER — Other Ambulatory Visit: Payer: Self-pay | Admitting: Physician Assistant

## 2020-09-15 ENCOUNTER — Ambulatory Visit (INDEPENDENT_AMBULATORY_CARE_PROVIDER_SITE_OTHER): Payer: Medicare HMO

## 2020-09-15 ENCOUNTER — Encounter: Payer: Self-pay | Admitting: Cardiovascular Disease

## 2020-09-15 ENCOUNTER — Ambulatory Visit: Payer: Medicare HMO | Admitting: Cardiovascular Disease

## 2020-09-15 ENCOUNTER — Other Ambulatory Visit: Payer: Self-pay

## 2020-09-15 ENCOUNTER — Other Ambulatory Visit: Payer: Self-pay | Admitting: Cardiovascular Disease

## 2020-09-15 ENCOUNTER — Encounter: Payer: Self-pay | Admitting: Internal Medicine

## 2020-09-15 VITALS — BP 122/72 | HR 75 | Ht 71.0 in | Wt 166.0 lb

## 2020-09-15 DIAGNOSIS — Z79899 Other long term (current) drug therapy: Secondary | ICD-10-CM

## 2020-09-15 DIAGNOSIS — Z952 Presence of prosthetic heart valve: Secondary | ICD-10-CM | POA: Diagnosis not present

## 2020-09-15 DIAGNOSIS — Z8679 Personal history of other diseases of the circulatory system: Secondary | ICD-10-CM

## 2020-09-15 DIAGNOSIS — G4733 Obstructive sleep apnea (adult) (pediatric): Secondary | ICD-10-CM

## 2020-09-15 DIAGNOSIS — I4891 Unspecified atrial fibrillation: Secondary | ICD-10-CM

## 2020-09-15 DIAGNOSIS — E7800 Pure hypercholesterolemia, unspecified: Secondary | ICD-10-CM

## 2020-09-15 DIAGNOSIS — I10 Essential (primary) hypertension: Secondary | ICD-10-CM

## 2020-09-15 DIAGNOSIS — C3491 Malignant neoplasm of unspecified part of right bronchus or lung: Secondary | ICD-10-CM

## 2020-09-15 DIAGNOSIS — I9789 Other postprocedural complications and disorders of the circulatory system, not elsewhere classified: Secondary | ICD-10-CM | POA: Diagnosis not present

## 2020-09-15 DIAGNOSIS — C3490 Malignant neoplasm of unspecified part of unspecified bronchus or lung: Secondary | ICD-10-CM

## 2020-09-15 DIAGNOSIS — Z7901 Long term (current) use of anticoagulants: Secondary | ICD-10-CM | POA: Diagnosis not present

## 2020-09-15 DIAGNOSIS — Z9889 Other specified postprocedural states: Secondary | ICD-10-CM

## 2020-09-15 DIAGNOSIS — I25810 Atherosclerosis of coronary artery bypass graft(s) without angina pectoris: Secondary | ICD-10-CM

## 2020-09-15 LAB — POCT INR: INR: 1.4 — AB (ref 2.0–3.0)

## 2020-09-15 MED ORDER — HYDROCODONE-ACETAMINOPHEN 5-325 MG PO TABS: 1.0000 | ORAL_TABLET | ORAL | 0 refills | Status: DC | PRN

## 2020-09-15 MED ORDER — LIDOCAINE-PRILOCAINE 2.5-2.5 % EX CREA: 1.0000 "application " | TOPICAL_CREAM | CUTANEOUS | 2 refills | Status: AC | PRN

## 2020-09-15 MED ORDER — ENOXAPARIN SODIUM 120 MG/0.8ML IJ SOSY: PREFILLED_SYRINGE | INTRAMUSCULAR | 0 refills | Status: DC

## 2020-09-15 NOTE — Patient Instructions (Addendum)
Take 20 mg tonight then continue 10 mg Thursday,  Repeat INR Friday.  Continue Lovenox Injections 1 tonight and 2 tomorrow AM/PM.

## 2020-09-15 NOTE — Patient Instructions (Signed)
Medication Instructions:  STOP the Aspirin  *If you need a refill on your cardiac medications before your next appointment, please call your pharmacy*   Lab Work: None ordered If you have labs (blood work) drawn today and your tests are completely normal, you will receive your results only by: Marland Kitchen MyChart Message (if you have MyChart) OR . A paper copy in the mail If you have any lab test that is abnormal or we need to change your treatment, we will call you to review the results.   Testing/Procedures: None ordered   Follow-Up: At [facility], you and your health needs are our priority.  As part of our continuing mission to provide you with exceptional heart care, we have created designated Provider Care Teams.  These Care Teams include your primary Cardiologist (physician) and Advanced Practice Providers (APPs -  Physician Assistants and Nurse Practitioners) who all work together to provide you with the care you need, when you need it.  We recommend signing up for the patient portal called "MyChart".  Sign up information is provided on this After Visit Summary.  MyChart is used to connect with patients for Virtual Visits (Telemedicine).  Patients are able to view lab/test results, encounter notes, upcoming appointments, etc.  Non-urgent messages can be sent to your provider as well.   To learn more about what you can do with MyChart, go to [email or web address].    Your next appointment:   12 month(s)  The format for your next appointment:   In Person  Provider:   You may see [name], MD or one of the following Advanced Practice Providers on your designated Care Team:    Almyra Deforest, PA-C  Fabian Sharp, PA-C or   Roby Lofts, [place]

## 2020-09-15 NOTE — Progress Notes (Addendum)
. Patient ID: Marcus [name], male   DOB: [birth year], 75 y.o.   MRN: [id number]    Cardiology Office Note    Date:  09/15/2020   ID:  Marcus [name], DOB December 01, 1945, MRN [id number]  PCP:  [name], MD  Cardiologist:   [name], MD   Chief Complaint  Patient presents with  . Coronary Artery Disease  . Cardiac Valve Problem    Mechanical AVR    History of Present Illness:  Marcus [name] is a 75 y.o. male who presents for follow-up of aortic valve mechanical prosthesis, previous repair of ascending aortic aneurysm, severe asymptomatic sinus bradycardia and coronary artery disease with previous bypass surgery (Severe AI - Bentall 23 mm St. Jude AVR, CABG x 2 - SVG-RCA and SVG-OM 2008, [place]).  Unfortunately he has recurrent lung adenocarcinoma IIIB (T3 N2 M0) tumor and is receiving chemotherapy with cis-platinum and Alimta with Dr. Julien Nordmann.  He had a repeat biopsy on 09/07/2020 (requiring interruption of warfarin and a Lovenox bridge).  He is reluctant to get a Port-A-Cath since he is concerned about the increased risk of infection with his prosthetic valve.  So far he is tolerating the chemo pretty well, except that he has lost his appetite.  He is due for his next cycle of chemotherapy next week.  He has not had problems with diarrhea or bleeding, but his INR was out of range when he was taking a lot of acetaminophen for pain.  He is currently taking hydrocodone-acetaminophen, since he has significant pain in his back, but the effects of the medication wears off very quickly, and about 2 hours, so he has run out of the pills.  He does not have anginal chest pain or any shortness of breath at rest or with activity, also denies dizziness, palpitations, syncope, falls or injuries.  He typically monitors his INR at home, but did not check it today.  He was on a Lovenox "bridge" and is on his last Lovenox dose today (interruption for a lung biopsy).  INR was checked today and he is still  subtherapeutic with an INR of 1.4.  MRI of the brain did not show any evidence of metastasis or active bleeding, but did show "innumerable" areas of microhemorrhage and evidence of prior subarachnoid hemorrhage with superficial siderosis (chronic findings).  He just had another echocardiogram for follow-up with his prosthesis and ascending aorta on 09/09/2020.  LVEF remains normal.  Prostatic valve flow parameters remain normal.  There is no evidence of significant aortic dilation.  On his CT from July 2021 the maximum diameter of his aorta was only 3.8 cm, stable over time.  He underwent a nuclear stress test in August which showed normal perfusion pattern and ejection fraction of 63%.  His last echocardiogram was performed June 2022 showed normal left ventricular systolic function and excellent parameters on his mechanical aortic valve prosthesis (St. Jude 23 mm, mean gradient 13 mmHg).  He had no evidence of perfusion abnormalities on her nuclear stress test 12/02/2019.  He was diagnosed with sleep apnea based on a study in 2009 he used CPAP for years but then after losing weight was told that he does not need to use it apnea anymore, following a repeat assessment in 2017 that showed only mild sleep apnea.  He still has his own equipment, which he purchased at the time.   He is no longer driving commercially. His recent echocardiogram in 2020 showed enlargement of the right atrium and  right ventricle.  Echocardiograms from 2009 in 2014 described the right ventricle is normal in size.  The echocardiograms in 2016 2019 described the right ventricle as mildly dilated.  The most recent echo from August 2020 states that the right ventricle is severely enlarged and the right atrium is also mild-moderately dilated.   Past Medical History:  Diagnosis Date  . Atrial fibrillation ([place])   . Dyspnea    with exertion   . Dysrhythmia   . Hypertension   . Obstructive sleep apnea 10/25/2007   cpap  . S/P AVR     2D ECHO, 04/25/2011 - EF >55%, Right ventricle-mild-moderately dilated  . Swelling of limb    LEA VENOUS, 08/22/2009 - no evidence of deep vein or superficial thrombosis; partially rupturing Baker's Cyst    Past Surgical History:  Procedure Laterality Date  . CARDIAC CATHETERIZATION Bilateral 05/10/2007   Significant 1-vessel disease, severely dilated aortic root with moderate severe aortic insufficiency  . CARDIAC SURGERY    . CARDIOVERSION  08/02/2007   150 Joule biphasic shock with restoration of sinus rhythm. Heart rate 60.  . cataract surgery     . COLONOSCOPY WITH PROPOFOL N/A 12/15/2016   Procedure: COLONOSCOPY WITH PROPOFOL;  Surgeon: Carol Ada, MD;  Location: [facility];  Service: Endoscopy;  Laterality: N/A;  . EYE SURGERY    . INTERCOSTAL NERVE BLOCK Right 12/08/2019   Procedure: INTERCOSTAL NERVE BLOCK;  Surgeon: [name], MD;  Location: [facility];  Service: Thoracic;  Laterality: Right;  . IR THORACENTESIS ASP PLEURAL SPACE W/IMG GUIDE  08/05/2020  . NM MYOVIEW LTD  04/08/2007   No evidence of inducible myocardial ischemia  . NODE DISSECTION Right 12/08/2019   Procedure: NODE DISSECTION;  Surgeon: [name], MD;  Location: [facility];  Service: Thoracic;  Laterality: Right;  . THYROIDECTOMY, PARTIAL    . torn meniscus in right knee surgery       Current Medications: Outpatient Medications Prior to Visit  Medication Sig Dispense Refill  . ALPRAZolam (XANAX) 0.25 MG tablet Take 1 tablet (0.25 mg total) by mouth every 8 (eight) hours as needed for up to 20 doses for anxiety. 20 tablet 0  . atorvastatin (LIPITOR) 80 MG tablet TAKE 1 TABLET BY MOUTH EVERY DAY (Patient taking differently: Take 80 mg by mouth daily.) 90 tablet 2  . cetirizine (ZYRTEC) 10 MG tablet Take 10 mg by mouth daily.    . folic acid (FOLVITE) 1 MG tablet Take 1 tablet (1 mg total) by mouth daily. 30 tablet 2  . Multiple Vitamins-Minerals (EYE SUPPORT PO) Take 1 tablet by mouth daily.  preservision    . prochlorperazine (COMPAZINE) 10 MG tablet Take 1 tablet (10 mg total) by mouth every 6 (six) hours as needed. 30 tablet 2  . ramipril (ALTACE) 10 MG capsule TAKE 1 CAPSULE BY MOUTH EVERY DAY (Patient taking differently: Take 10 mg by mouth daily.) 90 capsule 3  . warfarin (COUMADIN) 10 MG tablet TAKE 1 TO 1 AND 1/2 TABLETS DAILY AS DIRECTED BY COUMADIN CLINIC 135 tablet 1  . acetaminophen (TYLENOL) 500 MG tablet Take 500-1,000 mg by mouth every 6 (six) hours as needed for moderate pain.    Marland Kitchen aspirin EC 81 MG tablet Take 81 mg by mouth daily.    Marland Kitchen enoxaparin (LOVENOX) 120 MG/0.8ML injection Inject 0.8 mLs (120 mg total) into the skin daily. In the evening. 8 mL 1  . HYDROcodone-acetaminophen (NORCO/VICODIN) 5-325 MG tablet Take 1 tablet by mouth every  6 (six) hours as needed for moderate pain.    Marland Kitchen acetaminophen (TYLENOL) 325 MG tablet Take 2 tablets (650 mg total) by mouth every 6 (six) hours as needed for mild pain. (Patient not taking: Reported on 08/31/2020)    . NONFORMULARY OR COMPOUNDED ITEM Apply 2 sprays topically as directed. Testosterone Topical  Preparation 100 mg (Compounded by [place])  Apply 2 clicks (50 mg) daily     No facility-administered medications prior to visit.     Allergies:   Amlodipine, Sulfa antibiotics, Tetanus toxoids, Crestor [rosuvastatin], and Penicillins   Social History   Socioeconomic History  . Marital status: Married    Spouse name: diane  . Number of children: Not on file  . Years of education: Not on file  . Highest education level: Not on file  Occupational History  . Occupation: retired  Tobacco Use  . Smoking status: Former Smoker    Types: Cigarettes    Quit date: 04/11/1987    Years since quitting: 33.4  . Smokeless tobacco: Never Used  Vaping Use  . Vaping Use: Never used  Substance and Sexual Activity  . Alcohol use: No  . Drug use: No  . Sexual activity: Not on file  Other Topics Concern  .  Not on file  Social History Narrative  . Not on file   Social Determinants of Health   Financial Resource Strain: Low Risk   . Difficulty of Paying Living Expenses: Not hard at all  Food Insecurity: No Food Insecurity  . Worried About Charity fundraiser in the Last Year: Never true  . Ran Out of Food in the Last Year: Never true  Transportation Needs: No Transportation Needs  . Lack of Transportation (Medical): No  . Lack of Transportation (Non-Medical): No  Physical Activity: Insufficiently Active  . Days of Exercise per Week: 3 days  . Minutes of Exercise per Session: 40 min  Stress: No Stress Concern Present  . Feeling of Stress : Not at all  Social Connections: Moderately Integrated  . Frequency of Communication with Friends and Family: More than three times a week  . Frequency of Social Gatherings with Friends and Family: Never  . Attends Religious Services: 1 to 4 times per year  . Active Member of Clubs or Organizations: No  . Attends Archivist Meetings: Never  . Marital Status: Married     Family History:  The patient's family history includes Cancer in his father.   ROS:   Please see the history of present illness.   All other systems are reviewed and are negative.   PHYSICAL EXAM:   VS:  BP 122/72   Pulse 75   Ht 5\' 11"  (1.803 m)   Wt 166 lb (75.3 kg)   BMI 23.15 kg/m      General: Alert, oriented x3, no distress, moderately overweight, but appears younger than his stated age Head: no evidence of trauma, PERRL, EOMI, no exophtalmos or lid lag, no myxedema, no xanthelasma; normal ears, nose and oropharynx Neck: normal jugular venous pulsations and no hepatojugular reflux; brisk carotid pulses without delay and no carotid bruits Chest: clear to auscultation, no signs of consolidation by percussion or palpation, normal fremitus, symmetrical and full respiratory excursions Cardiovascular: normal position and quality of the apical impulse, regular  rhythm, very loud prosthetic valve clicks, no murmurs, rubs or gallops Abdomen: no tenderness or distention, no masses by palpation, no abnormal pulsatility or arterial bruits, normal bowel sounds,  no hepatosplenomegaly Extremities: no clubbing, cyanosis , there is trace right pretibial edema; 2+ radial, ulnar and brachial pulses bilaterally; 2+ right femoral, posterior tibial and dorsalis pedis pulses; 2+ left femoral, posterior tibial and dorsalis pedis pulses; no subclavian or femoral bruits Neurological: grossly nonfocal Psych: Normal mood and affect    Wt Readings from Last 3 Encounters:  09/15/20 166 lb (75.3 kg)  09/07/20 179 lb (81.2 kg)  09/03/20 170 lb 8 oz (77.3 kg)      Studies/Labs Reviewed:   EKG:  EKG is not ordered today.  ECG from January 01, 2020 shows normal sinus rhythm with right bundle branch block (old).  Event monitor 12/11/2018  Dominant rhythm is normal sinus with typical normal circadian variation. At times there is fairly significant nocturnal bradycardia and there is tachycardia with activity.  There is no evidence of atrial fibrillation or of any meaningful ventricular arrhythmia.  No pauses are seen.  There are occasional premature atrial complexes, many times in a pattern of bigeminy.   Mildly abnormal event monitor with periods of moderate sinus bradycardia at night and occasional atrial bigeminy.  Echo 09/09/2020  1. Left ventricular ejection fraction, by estimation, is 60 to 65%. The  left ventricle has normal function. The left ventricle has no regional  wall motion abnormalities. There is mild concentric left ventricular  hypertrophy. Left ventricular diastolic  parameters are indeterminate.  2. Right ventricular systolic function is normal. The right ventricular  size is normal.  3. The mitral valve is normal in structure. Trivial mitral valve  regurgitation. No evidence of mitral stenosis.  4. The aortic valve has been  repaired/replaced. Aortic valve  regurgitation is trivial. There is a 23 mm St. Jude bileaflet valve  present in the aortic position. Aortic valve mean gradient measures 13.3  mmHg.  5. The inferior vena cava is normal in size with greater than 50%  respiratory variability, suggesting right atrial pressure of 3 mmHg.   Comparison(s): No significant change from prior study.   Nuclear stress test December 02, 2019   Nuclear stress EF: 63%.  There was no ST segment deviation noted during stress.  No T wave inversion was noted during stress.  The study is normal.  This is a low risk study.   Low risk stress nuclear study with normal perfusion and normal left ventricular regional and global systolic function.   BMET    Component Value Date/Time   NA 135 09/09/2020 0906   NA 139 10/28/2019 1057   K 4.1 09/09/2020 0906   CL 102 09/09/2020 0906   CO2 24 09/09/2020 0906   GLUCOSE 98 09/09/2020 0906   BUN 21 09/09/2020 0906   BUN 10 10/28/2019 1057   CREATININE 0.91 09/09/2020 0906   CALCIUM 8.2 (L) 09/09/2020 0906   GFRNONAA >60 09/09/2020 0906   GFRAA >60 01/01/2020 1227    Lipid Panel     Component Value Date/Time   CHOL 170 08/13/2020 0951   CHOL 155 02/19/2020 0000   TRIG 106.0 08/13/2020 0951   HDL 41.60 08/13/2020 0951   HDL 42 02/19/2020 0000   CHOLHDL 4 08/13/2020 0951   VLDL 21.2 08/13/2020 0951   LDLCALC 107 (H) 08/13/2020 0951   LDLCALC 97 02/19/2020 0000    ASSESSMENT:    1. Coronary artery disease involving coronary bypass graft of native heart without angina pectoris   2. Essential hypertension   3. Hypercholesterolemia   4. Postoperative atrial fibrillation (HCC)   5. OSA (obstructive sleep  apnea)   6. S/P thoracic aortic aneurysm repair   7. H/O mechanical aortic valve replacement   8. Long term (current) use of anticoagulants   9. Adenocarcinoma of right lung, stage 3 (HCC)      PLAN:  In order of problems listed above:  6. CAD s/p CABG:  Asymptomatic, normal perfusion study less than a year ago. 7. HTN: Excellent control, avoid diuretics due to his history of syncope secondary to hypovolemia.  In the past he had bradycardia with beta-blockers.  He is on ramipril monotherapy at this time. 8. HLP: His LDL cholesterol is a little higher than target.  Waiting for him to finish chemotherapy before we add more medications. 9. Postop AF: None has been detected clinically since he required his cardioversion in 2009.  He is fully anticoagulated for his mechanical aortic valve anyway. 10. OSA: Compliant with CPAP.  Denies daytime hypersomnolence. 11. Ao aneurysm s/p Bentall repair: There was no mention of any abnormalities on the most recent CT scan from 08/01/2020 and the CT performed for his biopsy on 09/07/2020.  Maximum aortic diameter on most recent CT was only 38 mm.   His most recent scan did describe dilation of the celiac axis with a short axis of dissection, but this was also stable since 2015.   12. Mechanical AVR: Normal prosthetic valve function.  Stable gradients.  Likely to be at increased bleeding risk while he is receiving chemotherapy and his functional status is deteriorating.  We will have him stop taking the aspirin, especially keeping in mind the findings on the MRI of his brain. 13. Warfarin: We will need extension of his Lovenox "bridge" since his INR still subtherapeutic today. 14. Adenocarcinoma of lung, right upper lobe: Had a repeat biopsy performed on 09/07/2020.  It showed poorly differentiated adenocarcinoma.  On chemotherapy, well-tolerated so far.     Medication Adjustments/Labs and Tests Ordered: Current medicines are reviewed at length with the patient today.  Concerns regarding medicines are outlined above.  Medication changes, Labs and Tests ordered today are listed in the Patient Instructions below. Patient Instructions  Medication Instructions:  STOP the Aspirin  *If you need a refill on your cardiac  medications before your next appointment, please call your pharmacy*   Lab Work: None ordered If you have labs (blood work) drawn today and your tests are completely normal, you will receive your results only by: Marland Kitchen MyChart Message (if you have MyChart) OR . A paper copy in the mail If you have any lab test that is abnormal or we need to change your treatment, we will call you to review the results.   Testing/Procedures: None ordered   Follow-Up: At [facility], you and your health needs are our priority.  As part of our continuing mission to provide you with exceptional heart care, we have created designated Provider Care Teams.  These Care Teams include your primary Cardiologist (physician) and Advanced Practice Providers (APPs -  Physician Assistants and Nurse Practitioners) who all work together to provide you with the care you need, when you need it.  We recommend signing up for the patient portal called "MyChart".  Sign up information is provided on this After Visit Summary.  MyChart is used to connect with patients for Virtual Visits (Telemedicine).  Patients are able to view lab/test results, encounter notes, upcoming appointments, etc.  Non-urgent messages can be sent to your provider as well.   To learn more about what you can do with MyChart, go  to [email or web address].    Your next appointment:   12 month(s)  The format for your next appointment:   In Person  Provider:   You may see [name], MD or one of the following Advanced Practice Providers on your designated Care Team:    Almyra Deforest, PA-C  Fabian Sharp, [place] or   Roby Lofts, PA-C       Signed, [name], MD  09/15/2020 12:06 PM    [address], [place], [place]  [id number] Phone: [phone]; Fax: [phone]  \

## 2020-09-16 ENCOUNTER — Other Ambulatory Visit: Payer: Self-pay | Admitting: Cardiovascular Disease

## 2020-09-16 ENCOUNTER — Other Ambulatory Visit: Payer: Medicare HMO

## 2020-09-17 ENCOUNTER — Ambulatory Visit (INDEPENDENT_AMBULATORY_CARE_PROVIDER_SITE_OTHER): Payer: Medicare HMO | Admitting: Cardiology

## 2020-09-17 ENCOUNTER — Encounter (HOSPITAL_COMMUNITY): Payer: Self-pay

## 2020-09-17 ENCOUNTER — Inpatient Hospital Stay: Payer: Medicare HMO

## 2020-09-17 ENCOUNTER — Ambulatory Visit: Payer: Medicare HMO | Admitting: Thoracic Surgery (Cardiothoracic Vascular Surgery)

## 2020-09-17 ENCOUNTER — Other Ambulatory Visit: Payer: Self-pay

## 2020-09-17 DIAGNOSIS — Z5111 Encounter for antineoplastic chemotherapy: Secondary | ICD-10-CM | POA: Diagnosis not present

## 2020-09-17 DIAGNOSIS — T451X5A Adverse effect of antineoplastic and immunosuppressive drugs, initial encounter: Secondary | ICD-10-CM | POA: Diagnosis not present

## 2020-09-17 DIAGNOSIS — Z452 Encounter for adjustment and management of vascular access device: Secondary | ICD-10-CM | POA: Diagnosis not present

## 2020-09-17 DIAGNOSIS — H353132 Nonexudative age-related macular degeneration, bilateral, intermediate dry stage: Secondary | ICD-10-CM | POA: Diagnosis not present

## 2020-09-17 DIAGNOSIS — C3490 Malignant neoplasm of unspecified part of unspecified bronchus or lung: Secondary | ICD-10-CM

## 2020-09-17 DIAGNOSIS — Z79899 Other long term (current) drug therapy: Secondary | ICD-10-CM

## 2020-09-17 DIAGNOSIS — D6481 Anemia due to antineoplastic chemotherapy: Secondary | ICD-10-CM | POA: Diagnosis not present

## 2020-09-17 DIAGNOSIS — C3492 Malignant neoplasm of unspecified part of left bronchus or lung: Secondary | ICD-10-CM | POA: Diagnosis not present

## 2020-09-17 DIAGNOSIS — H5000 Unspecified esotropia: Secondary | ICD-10-CM | POA: Diagnosis not present

## 2020-09-17 DIAGNOSIS — C3411 Malignant neoplasm of upper lobe, right bronchus or lung: Secondary | ICD-10-CM | POA: Diagnosis not present

## 2020-09-17 DIAGNOSIS — Z952 Presence of prosthetic heart valve: Secondary | ICD-10-CM

## 2020-09-17 DIAGNOSIS — H35033 Hypertensive retinopathy, bilateral: Secondary | ICD-10-CM | POA: Diagnosis not present

## 2020-09-17 DIAGNOSIS — Z5112 Encounter for antineoplastic immunotherapy: Secondary | ICD-10-CM | POA: Diagnosis not present

## 2020-09-17 LAB — POCT INR: INR: 3.5 — AB (ref 2.0–3.0)

## 2020-09-17 LAB — CBC WITH DIFFERENTIAL (CANCER CENTER ONLY)
Abs Immature Granulocytes: 0.01 10*3/uL (ref 0.00–0.07)
Basophils Absolute: 0 10*3/uL (ref 0.0–0.1)
Basophils Relative: 0 %
Eosinophils Absolute: 0 10*3/uL (ref 0.0–0.5)
Eosinophils Relative: 0 %
HCT: 24.9 % — ABNORMAL LOW (ref 39.0–52.0)
Hemoglobin: 8 g/dL — ABNORMAL LOW (ref 13.0–17.0)
Immature Granulocytes: 0 %
Lymphocytes Relative: 16 %
Lymphs Abs: 0.6 10*3/uL — ABNORMAL LOW (ref 0.7–4.0)
MCH: 30.2 pg (ref 26.0–34.0)
MCHC: 32.1 g/dL (ref 30.0–36.0)
MCV: 94 fL (ref 80.0–100.0)
Monocytes Absolute: 0.3 10*3/uL (ref 0.1–1.0)
Monocytes Relative: 9 %
Neutro Abs: 2.7 10*3/uL (ref 1.7–7.7)
Neutrophils Relative %: 75 %
Platelet Count: 117 10*3/uL — ABNORMAL LOW (ref 150–400)
RBC: 2.65 MIL/uL — ABNORMAL LOW (ref 4.22–5.81)
RDW: 14.5 % (ref 11.5–15.5)
WBC Count: 3.6 10*3/uL — ABNORMAL LOW (ref 4.0–10.5)
nRBC: 0 % (ref 0.0–0.2)

## 2020-09-17 LAB — CMP (CANCER CENTER ONLY)
ALT: 62 U/L — ABNORMAL HIGH (ref 0–44)
AST: 55 U/L — ABNORMAL HIGH (ref 15–41)
Albumin: 2.8 g/dL — ABNORMAL LOW (ref 3.5–5.0)
Alkaline Phosphatase: 118 U/L (ref 38–126)
Anion gap: 8 (ref 5–15)
BUN: 18 mg/dL (ref 8–23)
CO2: 27 mmol/L (ref 22–32)
Calcium: 8.6 mg/dL — ABNORMAL LOW (ref 8.9–10.3)
Chloride: 103 mmol/L (ref 98–111)
Creatinine: 0.92 mg/dL (ref 0.61–1.24)
GFR, Estimated: >60 mL/min (ref >60)
Glucose, Bld: 104 mg/dL — ABNORMAL HIGH (ref 70–99)
Potassium: 4.4 mmol/L (ref 3.5–5.1)
Sodium: 138 mmol/L (ref 135–145)
Total Bilirubin: 0.3 mg/dL (ref 0.3–1.2)
Total Protein: 6.8 g/dL (ref 6.5–8.1)

## 2020-09-17 LAB — SAMPLE TO BLOOD BANK

## 2020-09-20 ENCOUNTER — Other Ambulatory Visit: Payer: Self-pay | Admitting: Physician Assistant

## 2020-09-20 DIAGNOSIS — C3491 Malignant neoplasm of unspecified part of right bronchus or lung: Secondary | ICD-10-CM

## 2020-09-20 LAB — POCT INR: INR: 3.7 — AB (ref 2.0–3.0)

## 2020-09-21 ENCOUNTER — Encounter (HOSPITAL_COMMUNITY): Payer: Self-pay

## 2020-09-21 ENCOUNTER — Telehealth: Payer: Self-pay | Admitting: Medical Oncology

## 2020-09-21 ENCOUNTER — Ambulatory Visit (INDEPENDENT_AMBULATORY_CARE_PROVIDER_SITE_OTHER): Payer: Medicare HMO | Admitting: Cardiovascular Disease

## 2020-09-21 DIAGNOSIS — Z79899 Other long term (current) drug therapy: Secondary | ICD-10-CM | POA: Diagnosis not present

## 2020-09-21 DIAGNOSIS — Z952 Presence of prosthetic heart valve: Secondary | ICD-10-CM

## 2020-09-21 NOTE — Telephone Encounter (Signed)
Wife called and stated that they do not need to bridge she called the facility and they stated no they didn't need to so I will route to kristin alvstad pharmd as she has been working on this

## 2020-09-21 NOTE — Telephone Encounter (Signed)
Wife asking if pt needs to stop coumadin and bridge with Lovenox for [facility] procedure.  Message sent to [facility] in [place]. She said pt no longer needs to hold blood thinners.

## 2020-09-23 ENCOUNTER — Inpatient Hospital Stay: Payer: Medicare HMO

## 2020-09-23 ENCOUNTER — Other Ambulatory Visit: Payer: Self-pay | Admitting: Medical Oncology

## 2020-09-23 ENCOUNTER — Other Ambulatory Visit: Payer: Self-pay

## 2020-09-23 ENCOUNTER — Encounter: Payer: Self-pay | Admitting: Internal Medicine

## 2020-09-23 ENCOUNTER — Inpatient Hospital Stay (HOSPITAL_BASED_OUTPATIENT_CLINIC_OR_DEPARTMENT_OTHER): Payer: Medicare HMO | Admitting: Internal Medicine

## 2020-09-23 VITALS — BP 154/78 | HR 56 | Temp 98.3°F | Resp 20

## 2020-09-23 VITALS — BP 119/66 | HR 63 | Temp 97.7°F | Resp 19 | Ht 71.0 in | Wt 164.6 lb

## 2020-09-23 DIAGNOSIS — Z5111 Encounter for antineoplastic chemotherapy: Secondary | ICD-10-CM | POA: Diagnosis not present

## 2020-09-23 DIAGNOSIS — C3491 Malignant neoplasm of unspecified part of right bronchus or lung: Secondary | ICD-10-CM

## 2020-09-23 DIAGNOSIS — Z79899 Other long term (current) drug therapy: Secondary | ICD-10-CM | POA: Diagnosis not present

## 2020-09-23 DIAGNOSIS — D6481 Anemia due to antineoplastic chemotherapy: Secondary | ICD-10-CM

## 2020-09-23 DIAGNOSIS — T451X5A Adverse effect of antineoplastic and immunosuppressive drugs, initial encounter: Secondary | ICD-10-CM | POA: Diagnosis not present

## 2020-09-23 DIAGNOSIS — C3490 Malignant neoplasm of unspecified part of unspecified bronchus or lung: Secondary | ICD-10-CM | POA: Diagnosis not present

## 2020-09-23 DIAGNOSIS — D649 Anemia, unspecified: Secondary | ICD-10-CM

## 2020-09-23 DIAGNOSIS — C3411 Malignant neoplasm of upper lobe, right bronchus or lung: Secondary | ICD-10-CM | POA: Diagnosis not present

## 2020-09-23 DIAGNOSIS — Z452 Encounter for adjustment and management of vascular access device: Secondary | ICD-10-CM | POA: Diagnosis not present

## 2020-09-23 DIAGNOSIS — Z5112 Encounter for antineoplastic immunotherapy: Secondary | ICD-10-CM | POA: Insufficient documentation

## 2020-09-23 LAB — CBC WITH DIFFERENTIAL (CANCER CENTER ONLY)
Abs Immature Granulocytes: 0.02 10*3/uL (ref 0.00–0.07)
Basophils Absolute: 0 10*3/uL (ref 0.0–0.1)
Basophils Relative: 0 %
Eosinophils Absolute: 0 10*3/uL (ref 0.0–0.5)
Eosinophils Relative: 0 %
HCT: 24.6 % — ABNORMAL LOW (ref 39.0–52.0)
Hemoglobin: 7.9 g/dL — ABNORMAL LOW (ref 13.0–17.0)
Immature Granulocytes: 1 %
Lymphocytes Relative: 15 %
Lymphs Abs: 0.4 10*3/uL — ABNORMAL LOW (ref 0.7–4.0)
MCH: 30.2 pg (ref 26.0–34.0)
MCHC: 32.1 g/dL (ref 30.0–36.0)
MCV: 93.9 fL (ref 80.0–100.0)
Monocytes Absolute: 0.1 10*3/uL (ref 0.1–1.0)
Monocytes Relative: 4 %
Neutro Abs: 1.9 10*3/uL (ref 1.7–7.7)
Neutrophils Relative %: 80 %
Platelet Count: 182 10*3/uL (ref 150–400)
RBC: 2.62 MIL/uL — ABNORMAL LOW (ref 4.22–5.81)
RDW: 15.7 % — ABNORMAL HIGH (ref 11.5–15.5)
WBC Count: 2.4 10*3/uL — ABNORMAL LOW (ref 4.0–10.5)
nRBC: 0 % (ref 0.0–0.2)

## 2020-09-23 LAB — CMP (CANCER CENTER ONLY)
ALT: 34 U/L (ref 0–44)
AST: 29 U/L (ref 15–41)
Albumin: 3.2 g/dL — ABNORMAL LOW (ref 3.5–5.0)
Alkaline Phosphatase: 101 U/L (ref 38–126)
Anion gap: 8 (ref 5–15)
BUN: 28 mg/dL — ABNORMAL HIGH (ref 8–23)
CO2: 24 mmol/L (ref 22–32)
Calcium: 8.4 mg/dL — ABNORMAL LOW (ref 8.9–10.3)
Chloride: 101 mmol/L (ref 98–111)
Creatinine: 0.94 mg/dL (ref 0.61–1.24)
GFR, Estimated: >60 mL/min (ref >60)
Glucose, Bld: 223 mg/dL — ABNORMAL HIGH (ref 70–99)
Potassium: 4.2 mmol/L (ref 3.5–5.1)
Sodium: 133 mmol/L — ABNORMAL LOW (ref 135–145)
Total Bilirubin: 0.4 mg/dL (ref 0.3–1.2)
Total Protein: 6.5 g/dL (ref 6.5–8.1)

## 2020-09-23 LAB — PREPARE RBC (CROSSMATCH)

## 2020-09-23 LAB — TSH: TSH: 0.509 u[IU]/mL (ref 0.320–4.118)

## 2020-09-23 LAB — SAMPLE TO BLOOD BANK

## 2020-09-23 MED ORDER — SODIUM CHLORIDE 0.9 % IV SOLN
Freq: Once | INTRAVENOUS | Status: AC
  Filled 2020-09-23: qty 250

## 2020-09-23 MED ORDER — DIPHENHYDRAMINE HCL 25 MG PO CAPS
25.0000 mg | ORAL_CAPSULE | Freq: Once | ORAL | Status: AC
Start: 2020-09-23 — End: 2020-09-23
  Administered 2020-09-23: 25 mg via ORAL

## 2020-09-23 MED ORDER — DIPHENHYDRAMINE HCL 25 MG PO CAPS
ORAL_CAPSULE | ORAL | Status: AC
  Filled 2020-09-23: qty 1

## 2020-09-23 MED ORDER — PALONOSETRON HCL INJECTION 0.25 MG/5ML
INTRAVENOUS | Status: AC
  Filled 2020-09-23: qty 5

## 2020-09-23 MED ORDER — ACETAMINOPHEN 325 MG PO TABS
650.0000 mg | ORAL_TABLET | Freq: Once | ORAL | Status: AC
  Administered 2020-09-23: 650 mg via ORAL

## 2020-09-23 MED ORDER — PALONOSETRON HCL INJECTION 0.25 MG/5ML
0.2500 mg | Freq: Once | INTRAVENOUS | Status: AC
  Administered 2020-09-23: 0.25 mg via INTRAVENOUS

## 2020-09-23 MED ORDER — SODIUM CHLORIDE 0.9% IV SOLUTION
250.0000 mL | Freq: Once | INTRAVENOUS | Status: DC
Start: 2020-09-23 — End: 2020-09-23
  Filled 2020-09-23: qty 250

## 2020-09-23 MED ORDER — ACETAMINOPHEN 325 MG PO TABS
ORAL_TABLET | ORAL | Status: AC
  Filled 2020-09-23: qty 2

## 2020-09-23 MED ORDER — SODIUM CHLORIDE 0.9 % IV SOLN
476.5000 mg | Freq: Once | INTRAVENOUS | Status: AC
  Administered 2020-09-23: 480 mg via INTRAVENOUS
  Filled 2020-09-23: qty 48

## 2020-09-23 MED ORDER — SODIUM CHLORIDE 0.9 % IV SOLN
10.0000 mg | Freq: Once | INTRAVENOUS | Status: AC
  Administered 2020-09-23: 10 mg via INTRAVENOUS
  Filled 2020-09-23: qty 10

## 2020-09-23 MED ORDER — SODIUM CHLORIDE 0.9 % IV SOLN
500.0000 mg/m2 | Freq: Once | INTRAVENOUS | Status: AC
  Administered 2020-09-23: 1000 mg via INTRAVENOUS
  Filled 2020-09-23: qty 40

## 2020-09-23 MED ORDER — SODIUM CHLORIDE 0.9 % IV SOLN
200.0000 mg | Freq: Once | INTRAVENOUS | Status: AC
  Administered 2020-09-23: 200 mg via INTRAVENOUS
  Filled 2020-09-23: qty 8

## 2020-09-23 MED ORDER — FOSAPREPITANT DIMEGLUMINE INJECTION 150 MG
150.0000 mg | Freq: Once | INTRAVENOUS | Status: AC
  Administered 2020-09-23: 150 mg via INTRAVENOUS
  Filled 2020-09-23: qty 150

## 2020-09-23 NOTE — Progress Notes (Signed)
Ok to treat with current lab work per provider. Patient is also getting blood today.

## 2020-09-23 NOTE — Progress Notes (Signed)
[place] [phone]   [phone]  OFFICE PROGRESS NOTE  [name], MD [address] [place] [address]  DIAGNOSIS: Recurrent non-small cell lung cancer, adenocarcinoma initially diagnosed as stage IIIb (T3, N2, M0) non-small cell lung cancer, adenocarcinoma presented with right upper lobe lung mass in addition to right hilar and mediastinal lymphadenopathy. Diagnosed in December 08, 2019. In April 2022, he had signs of disease recurrence with extensive pleural involvement, likely extending into extrapleural fat, in the RIGHT chest compatible with disease recurrence and associated with nodal disease in the mediastinum. 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 Equivocal uptake in the LEFT adrenal gland.    Molecular Studies: Negative for actionable mutations   PDL1: 1-49%   PRIOR THERAPY:  1) Right upper lobectomy with lymph node sampling under the care of Dr. Kipp Brood on December 08, 2019. 2) Adjuvant systemic chemotherapy with Cisplatin 75 mg/m2 and Alimta 500 mg/m2 IV every 3 weeks. Last dose on 04/13/20.  Status post 4 cycles.   CURRENT THERAPY: Palliative systemic chemotherapy with carboplatin for an AUC of 5, Alimta, 500 mg/m2, and Keytruda 200 mg/m2. First dose expected on 09/01/20.   INTERVAL HISTORY: Marcus [name] 75 y.o. male returns to the clinic today for follow-up visit accompanied by his wife.  The patient is feeling fine today with no concerning complaints.  He had less pain on the right side of the chest after starting the first cycle of his systemic chemotherapy.  He denied having any current shortness of breath but has mild cough with no hemoptysis.  He denied having any fever or chills.  He has no nausea, vomiting, diarrhea or constipation.  He has no headache or visual changes.  He tolerated the first cycle of his treatment with chemotherapy fairly well.  He is here today for  evaluation before starting cycle #2.  MEDICAL HISTORY: Past Medical History:  Diagnosis Date   Atrial fibrillation ([place])    Dyspnea    with exertion    Dysrhythmia    Hypertension    Obstructive sleep apnea 10/25/2007   cpap   S/P AVR    2D ECHO, 04/25/2011 - EF >55%, Right ventricle-mild-moderately dilated   Swelling of limb    LEA VENOUS, 08/22/2009 - no evidence of deep vein or superficial thrombosis; partially rupturing Baker's Cyst    ALLERGIES:  is allergic to amlodipine, sulfa antibiotics, tetanus toxoids, crestor [rosuvastatin], and penicillins.  MEDICATIONS:  Current Outpatient Medications  Medication Sig Dispense Refill   ALPRAZolam (XANAX) 0.25 MG tablet Take 1 tablet (0.25 mg total) by mouth every 8 (eight) hours as needed for up to 20 doses for anxiety. 20 tablet 0   atorvastatin (LIPITOR) 80 MG tablet TAKE 1 TABLET BY MOUTH EVERY DAY (Patient taking differently: Take 80 mg by mouth daily.) 90 tablet 2   cetirizine (ZYRTEC) 10 MG tablet Take 10 mg by mouth daily.     enoxaparin (LOVENOX) 120 MG/0.8ML injection Inject 0.8 mLs (120 mg total) into the skin 1 tonight and 2 tomorrow AM/PM 2.4 mL 0   folic acid (FOLVITE) 1 MG tablet Take 1 tablet (1 mg total) by mouth daily. 30 tablet 2   HYDROcodone-acetaminophen (NORCO/VICODIN) 5-325 MG tablet Take 1 tablet by mouth every 4 (four) hours as needed for moderate pain. 45 tablet 0   lidocaine-prilocaine (EMLA) cream Apply 1 application topically as needed. 30 g 2   Multiple Vitamins-Minerals (  EYE SUPPORT PO) Take 1 tablet by mouth daily. preservision     prochlorperazine (COMPAZINE) 10 MG tablet Take 1 tablet (10 mg total) by mouth every 6 (six) hours as needed. 30 tablet 2   ramipril (ALTACE) 10 MG capsule TAKE 1 CAPSULE BY MOUTH EVERY DAY (Patient taking differently: Take 10 mg by mouth daily.) 90 capsule 3   warfarin (COUMADIN) 10 MG tablet TAKE 1 TO 1 AND 1/2 TABLETS DAILY AS DIRECTED BY COUMADIN CLINIC 135 tablet 1   No  current facility-administered medications for this visit.    SURGICAL HISTORY:  Past Surgical History:  Procedure Laterality Date   CARDIAC CATHETERIZATION Bilateral 05/10/2007   Significant 1-vessel disease, severely dilated aortic root with moderate severe aortic insufficiency   CARDIAC SURGERY     CARDIOVERSION  08/02/2007   150 Joule biphasic shock with restoration of sinus rhythm. Heart rate 60.   cataract surgery      COLONOSCOPY WITH PROPOFOL N/A 12/15/2016   Procedure: COLONOSCOPY WITH PROPOFOL;  Surgeon: Carol Ada, MD;  Location: [facility];  Service: Endoscopy;  Laterality: N/A;   EYE SURGERY     INTERCOSTAL NERVE BLOCK Right 12/08/2019   Procedure: INTERCOSTAL NERVE BLOCK;  Surgeon: [name], MD;  Location: [facility];  Service: Thoracic;  Laterality: Right;   IR THORACENTESIS ASP PLEURAL SPACE W/IMG GUIDE  08/05/2020   NM MYOVIEW LTD  04/08/2007   No evidence of inducible myocardial ischemia   NODE DISSECTION Right 12/08/2019   Procedure: NODE DISSECTION;  Surgeon: [name], MD;  Location: [facility];  Service: Thoracic;  Laterality: Right;   THYROIDECTOMY, PARTIAL     torn meniscus in right knee surgery       REVIEW OF SYSTEMS:  Constitutional: positive for fatigue Eyes: negative Ears, nose, mouth, throat, and face: negative Respiratory: positive for cough and pleurisy/chest pain Cardiovascular: negative Gastrointestinal: negative Genitourinary:negative Integument/breast: negative Hematologic/lymphatic: negative Musculoskeletal:negative Neurological: negative Behavioral/Psych: negative Endocrine: negative Allergic/Immunologic: negative   PHYSICAL EXAMINATION: General appearance: alert, cooperative, fatigued, and no distress Head: Normocephalic, without obvious abnormality, atraumatic Neck: no adenopathy, no JVD, supple, symmetrical, trachea midline, and thyroid not enlarged, symmetric, no tenderness/mass/nodules Lymph nodes: Cervical,  supraclavicular, and axillary nodes normal. Resp: clear to auscultation bilaterally Back: symmetric, no curvature. ROM normal. No CVA tenderness. Cardio: regular rate and rhythm, S1, S2 normal, no murmur, click, rub or gallop GI: soft, non-tender; bowel sounds normal; no masses,  no organomegaly Extremities: extremities normal, atraumatic, no cyanosis or edema Neurologic: Alert and oriented X 3, normal strength and tone. Normal symmetric reflexes. Normal coordination and gait  ECOG PERFORMANCE STATUS: 1 - Symptomatic but completely ambulatory  Blood pressure 119/66, pulse 63, temperature 97.7 F (36.5 C), temperature source Oral, resp. rate 19, height 5' 11"  (1.803 m), weight 164 lb 9.6 oz (74.7 kg), SpO2 100 %.  LABORATORY DATA: Lab Results  Component Value Date   WBC 2.4 (L) 09/23/2020   HGB 7.9 (L) 09/23/2020   HCT 24.6 (L) 09/23/2020   MCV 93.9 09/23/2020   PLT 182 09/23/2020      Chemistry      Component Value Date/Time   NA 138 09/17/2020 0919   NA 139 10/28/2019 1057   K 4.4 09/17/2020 0919   CL 103 09/17/2020 0919   CO2 27 09/17/2020 0919   BUN 18 09/17/2020 0919   BUN 10 10/28/2019 1057   CREATININE 0.92 09/17/2020 0919      Component Value Date/Time   CALCIUM 8.6 (L) 09/17/2020  0919   ALKPHOS 118 09/17/2020 0919   AST 55 (H) 09/17/2020 0919   ALT 62 (H) 09/17/2020 0919   BILITOT 0.3 09/17/2020 0919       RADIOGRAPHIC STUDIES: DG Chest 2 View  Result Date: 09/01/2020 CLINICAL DATA:  75 year old male with a history of chest pain. Imaging history thoracic metastatic disease, pleural involvement, carcinomatosis EXAM: CHEST - 2 VIEW COMPARISON:  PET-CT 08/20/2020 FINDINGS: Cardiomediastinal silhouette unchanged. Surgical changes of median sternotomy, CABG, aortic valve repair. Persisting opacity at the right lung base obscuring the right hemidiaphragm and the right heart border. Pleuroparenchymal thickening of the right-sided pleural interface. Improved appearance  of the lentiform opacities in the right mid lung and infrahilar region, with persistent thickening of the fissure. Reticular opacities throughout the bilateral lungs, similar to the prior. No evidence of interlobular septal thickening to indicate edema. No pneumothorax. Left lung relatively unchanged with no new confluent airspace disease. Degenerative changes of the spine.  No acute displaced fracture. IMPRESSION: No acute change in the appearance of the chest x-ray, with plain film demonstration of thoracic metastatic disease better characterized on recent PET-CT, including extensive right-sided pleural involvement potentially a source of pain. Surgical changes of median sternotomy, CABG, aortic valve repair Electronically Signed   By: Corrie Mckusick D.O.   On: 09/01/2020 09:36   MR Brain W Wo Contrast  Result Date: 09/03/2020 CLINICAL DATA:  Non-small-cell lung cancer staging EXAM: MRI HEAD WITHOUT AND WITH CONTRAST TECHNIQUE: Multiplanar, multiecho pulse sequences of the brain and surrounding structures were obtained without and with intravenous contrast. CONTRAST:  58m GADAVIST GADOBUTROL 1 MMOL/ML IV SOLN COMPARISON:  MRI head 12/18/2019 FINDINGS: Brain: Negative for metastatic disease to brain. No enhancing mass lesion is present. Generalized atrophy. Extensive white matter changes with periventricular deep white matter hyperintensities bilaterally, comparable to the prior study. Small chronic infarct right cerebellum. Brainstem intact. Negative for acute infarct Innumerable areas of microhemorrhage throughout both cerebral hemispheres. There is evidence of prior subarachnoid hemorrhage with superficial siderosis in the posterior fossa and around both cerebral hemispheres. Findings are unchanged from the prior study. Vascular: Normal arterial flow voids Skull and upper cervical spine: No focal skeletal abnormality. Sinuses/Orbits: Paranasal sinuses clear. Bilateral cataract extraction Other: None  IMPRESSION: Negative for metastatic disease to brain Generalized atrophy. Extensive chronic microvascular ischemic changes. No acute infarct Innumerable areas of chronic microhemorrhage in both cerebral hemispheres with evidence of superficial siderosis, unchanged from the prior study. Findings compatible with cerebral amyloid. Electronically Signed   By: CFranchot GalloM.D.   On: 09/03/2020 16:45   CT Biopsy  Result Date: 09/07/2020 INDICATION: 75year old gentleman with history of non-small cell lung cancer presents today measure radiology for biopsy of FDG avid pleural mass. EXAM: CT-guided biopsy of FDG avid pleural mass. MEDICATIONS: None. ANESTHESIA/SEDATION: Moderate (conscious) sedation was employed during this procedure. A total of Versed 1 mg and Fentanyl 50 mcg was administered intravenously. Moderate Sedation Time: 17 minutes. The patient's level of consciousness and vital signs were monitored continuously by radiology nursing throughout the procedure under my direct supervision. COMPLICATIONS: None immediate. PROCEDURE: Informed written consent was obtained from the patient after a thorough discussion of the procedural risks, benefits and alternatives. All questions were addressed. Maximal Sterile Barrier Technique was utilized including caps, mask, sterile gowns, sterile gloves, sterile drape, hand hygiene and skin antiseptic. A timeout was performed prior to the initiation of the procedure. Patient position prone on the procedure table. The posterior chest wall skin prepped and draped in  usual fashion. Following local lidocaine administration, 17 gauge introducer needle was advanced into the region of abnormal pleural thickening and 4-18 gauge cores were obtained. All samples were provided to pathology in formalin. Patient on the procedure well. No pneumothorax seen on postprocedure CT. IMPRESSION: CT-guided biopsy of FDG avid right pleural mass. Electronically Signed   By: Miachel Roux M.D.   On:  09/07/2020 12:51   DG Chest Port 1 View  Result Date: 09/07/2020 CLINICAL DATA:  Status post lung biopsy. EXAM: PORTABLE CHEST 1 VIEW COMPARISON:  CT biopsy images 09/07/2020.  Chest x-ray 08/30/2020. FINDINGS: Prior median sternotomy and cardiac valve replacement. Stable cardiomegaly. Low lung volumes stable right-sided pleuroparenchymal thickening. No pneumothorax post biopsy. IMPRESSION: No pneumothorax post biopsy. Electronically Signed   By: Marcello Moores  Register   On: 09/07/2020 10:23   ECHOCARDIOGRAM COMPLETE  Result Date: 09/09/2020    ECHOCARDIOGRAM REPORT   Patient Name:   JOASH TONY Streett Date of Exam: 09/09/2020 Medical Rec #:  [id number]     Height:       71.0 in Accession #:    [id number]    Weight:       179.0 lb Date of Birth:  25-Jul-1945      BSA:          2.011 m Patient Age:    42 years      BP:           122/66 mmHg Patient Gender: M             HR:           61 bpm. Exam Location:  [facility] Procedure: 2D Echo, Cardiac Doppler and Color Doppler Indications:    Z95.2 History of mechanical aortic valve replacement  History:        Patient has prior history of Echocardiogram examinations, most                 recent 11/25/2018. CAD, COPD, Arrythmias:Atrial Fibrillation;                 Risk Factors:Hypertension and Dyslipidemia. S/P thoracic aortic                 aneurysm repair. Lung cancer. Bradycardia.                 Aortic Valve: 23 mm St. Jude bileaflet valve is present in the                 aortic position.  Sonographer:    Diamond Nickel RCS Referring Phys: [place]  1. Left ventricular ejection fraction, by estimation, is 60 to 65%. The left ventricle has normal function. The left ventricle has no regional wall motion abnormalities. There is mild concentric left ventricular hypertrophy. Left ventricular diastolic parameters are indeterminate.  2. Right ventricular systolic function is normal. The right ventricular size is normal.  3. The mitral valve is normal in  structure. Trivial mitral valve regurgitation. No evidence of mitral stenosis.  4. The aortic valve has been repaired/replaced. Aortic valve regurgitation is trivial. There is a 23 mm St. Jude bileaflet valve present in the aortic position. Aortic valve mean gradient measures 13.3 mmHg.  5. The inferior vena cava is normal in size with greater than 50% respiratory variability, suggesting right atrial pressure of 3 mmHg. Comparison(s): No significant change from prior study. FINDINGS  Left Ventricle: Left ventricular ejection fraction, by estimation, is 60 to 65%. The left ventricle has normal  function. The left ventricle has no regional wall motion abnormalities. The left ventricular internal cavity size was normal in size. There is  mild concentric left ventricular hypertrophy. Left ventricular diastolic parameters are indeterminate. Right Ventricle: The right ventricular size is normal. Right vetricular wall thickness was not well visualized. Right ventricular systolic function is normal. Left Atrium: Left atrial size was normal in size. Right Atrium: Right atrial size was normal in size. Pericardium: There is no evidence of pericardial effusion. Mitral Valve: The mitral valve is normal in structure. Trivial mitral valve regurgitation. No evidence of mitral valve stenosis. Tricuspid Valve: The tricuspid valve is normal in structure. Tricuspid valve regurgitation is trivial. No evidence of tricuspid stenosis. Aortic Valve: The aortic valve has been repaired/replaced. Aortic valve regurgitation is trivial. Aortic valve mean gradient measures 13.3 mmHg. Aortic valve peak gradient measures 31.9 mmHg. Aortic valve area, by VTI measures 1.41 cm. There is a 23 mm St. Jude bileaflet valve present in the aortic position. Pulmonic Valve: The pulmonic valve was grossly normal. Pulmonic valve regurgitation is mild to moderate. Aorta: The aortic root, ascending aorta, aortic arch and descending aorta are all structurally  normal, with no evidence of dilitation or obstruction. Venous: The inferior vena cava is normal in size with greater than 50% respiratory variability, suggesting right atrial pressure of 3 mmHg. IAS/Shunts: The interatrial septum appears to be lipomatous. The atrial septum is grossly normal.  LEFT VENTRICLE PLAX 2D LVIDd:         4.30 cm  Diastology LVIDs:         2.30 cm  LV e' medial:    5.77 cm/s LV PW:         1.50 cm  LV E/e' medial:  12.7 LV IVS:        1.40 cm  LV e' lateral:   8.59 cm/s LVOT diam:     2.10 cm  LV E/e' lateral: 8.5 LV SV:         67 LV SV Index:   33 LVOT Area:     3.46 cm  RIGHT VENTRICLE RV Basal diam:  3.20 cm RV S prime:     11.90 cm/s TAPSE (M-mode): 1.3 cm LEFT ATRIUM             Index       RIGHT ATRIUM           Index LA diam:        3.80 cm 1.89 cm/m  RA Area:     15.00 cm LA Vol (A2C):   64.0 ml 31.82 ml/m RA Volume:   36.70 ml  18.25 ml/m LA Vol (A4C):   33.0 ml 16.41 ml/m LA Biplane Vol: 46.5 ml 23.12 ml/m  AORTIC VALVE AV Area (Vmax):    1.21 cm AV Area (Vmean):   1.28 cm AV Area (VTI):     1.41 cm AV Vmax:           282.33 cm/s AV Vmean:          165.000 cm/s AV VTI:            0.471 m AV Peak Grad:      31.9 mmHg AV Mean Grad:      13.3 mmHg LVOT Vmax:         98.60 cm/s LVOT Vmean:        61.000 cm/s LVOT VTI:          0.192 m LVOT/AV VTI ratio: 0.41 MITRAL VALVE MV  Area (PHT): 2.91 cm    SHUNTS MV Decel Time: 261 msec    Systemic VTI:  0.19 m MV E velocity: 73.30 cm/s  Systemic Diam: 2.10 cm MV A velocity: 83.10 cm/s MV E/A ratio:  0.88 Buford Dresser MD Electronically signed by Buford Dresser MD Signature Date/Time: 09/09/2020/1:26:16 PM    Final     ASSESSMENT AND PLAN: This is a very pleasant 75 years old white male with metastatic non-small cell lung cancer initially diagnosed as stage IIIb non-small cell lung cancer, adenocarcinoma presented with right upper lobe lung mass in addition to right hilar and mediastinal lymphadenopathy diagnosed in  August 2021 with negative actionable mutation and PD-L1 expression in the range of 1-49%. The patient declined enrollment in the [facility] clinical trial for treatment with systemic chemotherapy plus/minus immunotherapy. He underwent adjuvant systemic chemotherapy with cisplatin 75 mg/M2 and Alimta 500 mg/M2 every 3 weeks.  Status post 4 cycles.  He tolerated his treatment fairly well with no concerning adverse effects. The patient had evidence for disease recurrence in March 2022 with pleural-based metastasis that was biopsy-proven to be recurrent adenocarcinoma of the lung. He had molecular studies by [employer] that showed no actionable mutations. The patient is currently undergoing systemic chemotherapy with carboplatin for AUC of 5, Alimta 500 Mg/M2 and Keytruda 200 Mg IV every 3 weeks status post 1 cycle. The patient tolerated the first cycle of his treatment fairly well with no significant adverse effects and he has improvement on the right-sided chest pain after the first cycle of his treatment. I recommended for him to proceed with cycle #2 today as planned. The patient and his wife had several questions about the molecular studies and I reviewed the [employer] study with them again.  Unfortunately there is no actionable mutation detected by the blood test or the tissue test.  His PD-L1 also was reported to be less than 1% on the newer biopsy. The patient will continue his current treatment with chemotherapy as planned. For the chemotherapy-induced anemia, I will arrange for the patient to receive 2 units of PRBCs transfusion. He will come back for follow-up visit in 3 weeks for evaluation before starting cycle #3. The patient was advised to call immediately if he has any concerning symptoms in the interval. The patient voices understanding of current disease status and treatment options and is in agreement with the current care plan.  All questions were answered. The patient knows to call  the clinic with any problems, questions or concerns. We can certainly see the patient much sooner if necessary.  The total time spent in the appointment was 30 minutes.  Disclaimer: This note was dictated with voice recognition software. Similar sounding words can inadvertently be transcribed and may not be corrected upon review.

## 2020-09-23 NOTE — Patient Instructions (Signed)
[facility]  Discharge Instructions: Thank you for choosing [facility] to provide your oncology and hematology care.   If you have a lab appointment with the [facility], please go directly to the [address] and check in at the registration area.   Wear comfortable clothing and clothing appropriate for easy access to any Portacath or PICC line.   We strive to give you quality time with your provider. You may need to reschedule your appointment if you arrive late (15 or more minutes).  Arriving late affects you and other patients whose appointments are after yours.  Also, if you miss three or more appointments without notifying the office, you may be dismissed from the clinic at the provider's discretion.      For prescription refill requests, have your pharmacy contact our office and allow 72 hours for refills to be completed.    Today you received the following chemotherapy and/or immunotherapy agents Keytruda, Alimta, Carboplatin   To help prevent nausea and vomiting after your treatment, we encourage you to take your nausea medication as directed.  BELOW ARE SYMPTOMS THAT SHOULD BE REPORTED IMMEDIATELY: *FEVER GREATER THAN 100.4 F (38 C) OR HIGHER *CHILLS OR SWEATING *NAUSEA AND VOMITING THAT IS NOT CONTROLLED WITH YOUR NAUSEA MEDICATION *UNUSUAL SHORTNESS OF BREATH *UNUSUAL BRUISING OR BLEEDING *URINARY PROBLEMS (pain or burning when urinating, or frequent urination) *BOWEL PROBLEMS (unusual diarrhea, constipation, pain near the anus) TENDERNESS IN MOUTH AND THROAT WITH OR WITHOUT PRESENCE OF ULCERS (sore throat, sores in mouth, or a toothache) UNUSUAL RASH, SWELLING OR PAIN  UNUSUAL VAGINAL DISCHARGE OR ITCHING   Items with * indicate a potential emergency and should be followed up as soon as possible or go to the Emergency Department if any problems should occur.  Please show the CHEMOTHERAPY ALERT CARD or IMMUNOTHERAPY ALERT CARD  at check-in to the Emergency Department and triage nurse.  Should you have questions after your visit or need to cancel or reschedule your appointment, please contact [facility]  Dept: [phone]  and follow the prompts.  Office hours are 8:00 a.m. to 4:30 p.m. Monday - Friday. Please note that voicemails left after 4:00 p.m. may not be returned until the following business day.  We are closed weekends and major holidays. You have access to a nurse at all times for urgent questions. Please call the main number to the clinic Dept: [phone] and follow the prompts.   For any non-urgent questions, you may also contact your provider using MyChart. We now offer e-Visits for anyone 105 and older to request care online for non-urgent symptoms. For details visit [email or web address].   Also download the MyChart app! Go to the app store, search "MyChart", open the app, select [facility], and log in with your MyChart username and password.  Due to Covid, a mask is required upon entering the hospital/clinic. If you do not have a mask, one will be given to you upon arrival. For doctor visits, patients may have 1 support person aged 74 or older with them. For treatment visits, patients cannot have anyone with them due to current Covid guidelines and our immunocompromised population.  Pembrolizumab [facility]) injection What is this medicine? PEMBROLIZUMAB (pem broe liz ue mab) is a monoclonal antibody. It is used to treat certain types of cancer. This medicine may be used for other purposes; ask your health care provider or pharmacist if you have questions. COMMON BRAND NAME(S): Keytruda What should  I tell my health care provider before I take this medicine? They need to know if you have any of these conditions: autoimmune diseases like Crohn's disease, ulcerative colitis, or lupus have had or planning to have an allogeneic stem cell transplant (uses someone else's stem  cells) history of organ transplant history of chest radiation nervous system problems like myasthenia gravis or Guillain-Barre syndrome an unusual or allergic reaction to pembrolizumab, other medicines, foods, dyes, or preservatives pregnant or trying to get pregnant breast-feeding How should I use this medicine? This medicine is for infusion into a vein. It is given by a health care professional in a hospital or clinic setting. A special MedGuide will be given to you before each treatment. Be sure to read this information carefully each time. Talk to your pediatrician regarding the use of this medicine in children. While this drug may be prescribed for children as young as 6 months for selected conditions, precautions do apply. Overdosage: If you think you have taken too much of this medicine contact a poison control center or emergency room at once. NOTE: This medicine is only for you. Do not share this medicine with others. What if I miss a dose? It is important not to miss your dose. Call your doctor or health care professional if you are unable to keep an appointment. What may interact with this medicine? Interactions have not been studied. This list may not describe all possible interactions. Give your health care provider a list of all the medicines, herbs, non-prescription drugs, or dietary supplements you use. Also tell them if you smoke, drink alcohol, or use illegal drugs. Some items may interact with your medicine. What should I watch for while using this medicine? Your condition will be monitored carefully while you are receiving this medicine. You may need blood work done while you are taking this medicine. Do not become pregnant while taking this medicine or for 4 months after stopping it. Women should inform their doctor if they wish to become pregnant or think they might be pregnant. There is a potential for serious side effects to an unborn child. Talk to your health care  professional or pharmacist for more information. Do not breast-feed an infant while taking this medicine or for 4 months after the last dose. What side effects may I notice from receiving this medicine? Side effects that you should report to your doctor or health care professional as soon as possible: allergic reactions like skin rash, itching or hives, swelling of the face, lips, or tongue bloody or black, tarry breathing problems changes in vision chest pain chills confusion constipation cough diarrhea dizziness or feeling faint or lightheaded fast or irregular heartbeat fever flushing joint pain low blood counts - this medicine may decrease the number of white blood cells, red blood cells and platelets. You may be at increased risk for infections and bleeding. muscle pain muscle weakness pain, tingling, numbness in the hands or feet persistent headache redness, blistering, peeling or loosening of the skin, including inside the mouth signs and symptoms of high blood sugar such as dizziness; dry mouth; dry skin; fruity breath; nausea; stomach pain; increased hunger or thirst; increased urination signs and symptoms of kidney injury like trouble passing urine or change in the amount of urine signs and symptoms of liver injury like dark urine, light-colored stools, loss of appetite, nausea, right upper belly pain, yellowing of the eyes or skin sweating swollen lymph nodes weight loss Side effects that usually do not require medical  attention (report to your doctor or health care professional if they continue or are bothersome): decreased appetite hair loss tiredness This list may not describe all possible side effects. Call your doctor for medical advice about side effects. You may report side effects to FDA at 1-800-FDA-1088. Where should I keep my medicine? This drug is given in a hospital or clinic and will not be stored at home. NOTE: This sheet is a summary. It may not cover  all possible information. If you have questions about this medicine, talk to your doctor, pharmacist, or health care provider.  2021 Elsevier/Gold Standard (2019-02-26 21:44:53)  Pemetrexed (Alimta) injection What is this medicine? PEMETREXED (PEM e TREX ed) is a chemotherapy drug used to treat lung cancers like non-small cell lung cancer and mesothelioma. It may also be used to treat other cancers. This medicine may be used for other purposes; ask your health care provider or pharmacist if you have questions. COMMON BRAND NAME(S): Alimta What should I tell my health care provider before I take this medicine? They need to know if you have any of these conditions: infection (especially a virus infection such as chickenpox, cold sores, or herpes) kidney disease low blood counts, like low white cell, platelet, or red cell counts lung or breathing disease, like asthma radiation therapy an unusual or allergic reaction to pemetrexed, other medicines, foods, dyes, or preservative pregnant or trying to get pregnant breast-feeding How should I use this medicine? This drug is given as an infusion into a vein. It is administered in a hospital or clinic by a specially trained health care professional. Talk to your pediatrician regarding the use of this medicine in children. Special care may be needed. Overdosage: If you think you have taken too much of this medicine contact a poison control center or emergency room at once. NOTE: This medicine is only for you. Do not share this medicine with others. What if I miss a dose? It is important not to miss your dose. Call your doctor or health care professional if you are unable to keep an appointment. What may interact with this medicine? This medicine may interact with the following medications: Ibuprofen This list may not describe all possible interactions. Give your health care provider a list of all the medicines, herbs, non-prescription drugs, or  dietary supplements you use. Also tell them if you smoke, drink alcohol, or use illegal drugs. Some items may interact with your medicine. What should I watch for while using this medicine? Visit your doctor for checks on your progress. This drug may make you feel generally unwell. This is not uncommon, as chemotherapy can affect healthy cells as well as cancer cells. Report any side effects. Continue your course of treatment even though you feel ill unless your doctor tells you to stop. In some cases, you may be given additional medicines to help with side effects. Follow all directions for their use. Call your doctor or health care professional for advice if you get a fever, chills or sore throat, or other symptoms of a cold or flu. Do not treat yourself. This drug decreases your body's ability to fight infections. Try to avoid being around people who are sick. This medicine may increase your risk to bruise or bleed. Call your doctor or health care professional if you notice any unusual bleeding. Be careful brushing and flossing your teeth or using a toothpick because you may get an infection or bleed more easily. If you have any dental work done, tell  your dentist you are receiving this medicine. Avoid taking products that contain aspirin, acetaminophen, ibuprofen, naproxen, or ketoprofen unless instructed by your doctor. These medicines may hide a fever. Call your doctor or health care professional if you get diarrhea or mouth sores. Do not treat yourself. To protect your kidneys, drink water or other fluids as directed while you are taking this medicine. Do not become pregnant while taking this medicine or for 6 months after stopping it. Women should inform their doctor if they wish to become pregnant or think they might be pregnant. Men should not father a child while taking this medicine and for 3 months after stopping it. This may interfere with the ability to father a child. You should talk to  your doctor or health care professional if you are concerned about your fertility. There is a potential for serious side effects to an unborn child. Talk to your health care professional or pharmacist for more information. Do not breast-feed an infant while taking this medicine or for 1 week after stopping it. What side effects may I notice from receiving this medicine? Side effects that you should report to your doctor or health care professional as soon as possible: allergic reactions like skin rash, itching or hives, swelling of the face, lips, or tongue breathing problems redness, blistering, peeling or loosening of the skin, including inside the mouth signs and symptoms of bleeding such as bloody or black, tarry stools; red or dark-brown urine; spitting up blood or brown material that looks like coffee grounds; red spots on the skin; unusual bruising or bleeding from the eye, gums, or nose signs and symptoms of infection like fever or chills; cough; sore throat; pain or trouble passing urine signs and symptoms of kidney injury like trouble passing urine or change in the amount of urine signs and symptoms of liver injury like dark yellow or brown urine; general ill feeling or flu-like symptoms; light-colored stools; loss of appetite; nausea; right upper belly pain; unusually weak or tired; yellowing of the eyes or skin Side effects that usually do not require medical attention (report to your doctor or health care professional if they continue or are bothersome): constipation mouth sores nausea, vomiting unusually weak or tired This list may not describe all possible side effects. Call your doctor for medical advice about side effects. You may report side effects to FDA at 1-800-FDA-1088. Where should I keep my medicine? This drug is given in a hospital or clinic and will not be stored at home. NOTE: This sheet is a summary. It may not cover all possible information. If you have questions about  this medicine, talk to your doctor, pharmacist, or health care provider.  2021 Elsevier/Gold Standard (2017-05-16 16:11:33)  Carboplatin injection What is this medicine? CARBOPLATIN (KAR boe pla tin) is a chemotherapy drug. It targets fast dividing cells, like cancer cells, and causes these cells to die. This medicine is used to treat ovarian cancer and many other cancers. This medicine may be used for other purposes; ask your health care provider or pharmacist if you have questions. COMMON BRAND NAME(S): Paraplatin What should I tell my health care provider before I take this medicine? They need to know if you have any of these conditions: blood disorders hearing problems kidney disease recent or ongoing radiation therapy an unusual or allergic reaction to carboplatin, cisplatin, other chemotherapy, other medicines, foods, dyes, or preservatives pregnant or trying to get pregnant breast-feeding How should I use this medicine? This drug is usually  given as an infusion into a vein. It is administered in a hospital or clinic by a specially trained health care professional. Talk to your pediatrician regarding the use of this medicine in children. Special care may be needed. Overdosage: If you think you have taken too much of this medicine contact a poison control center or emergency room at once. NOTE: This medicine is only for you. Do not share this medicine with others. What if I miss a dose? It is important not to miss a dose. Call your doctor or health care professional if you are unable to keep an appointment. What may interact with this medicine? medicines for seizures medicines to increase blood counts like filgrastim, pegfilgrastim, sargramostim some antibiotics like amikacin, gentamicin, neomycin, streptomycin, tobramycin vaccines Talk to your doctor or health care professional before taking any of these medicines: acetaminophen aspirin ibuprofen ketoprofen naproxen This  list may not describe all possible interactions. Give your health care provider a list of all the medicines, herbs, non-prescription drugs, or dietary supplements you use. Also tell them if you smoke, drink alcohol, or use illegal drugs. Some items may interact with your medicine. What should I watch for while using this medicine? Your condition will be monitored carefully while you are receiving this medicine. You will need important blood work done while you are taking this medicine. This drug may make you feel generally unwell. This is not uncommon, as chemotherapy can affect healthy cells as well as cancer cells. Report any side effects. Continue your course of treatment even though you feel ill unless your doctor tells you to stop. In some cases, you may be given additional medicines to help with side effects. Follow all directions for their use. Call your doctor or health care professional for advice if you get a fever, chills or sore throat, or other symptoms of a cold or flu. Do not treat yourself. This drug decreases your body's ability to fight infections. Try to avoid being around people who are sick. This medicine may increase your risk to bruise or bleed. Call your doctor or health care professional if you notice any unusual bleeding. Be careful brushing and flossing your teeth or using a toothpick because you may get an infection or bleed more easily. If you have any dental work done, tell your dentist you are receiving this medicine. Avoid taking products that contain aspirin, acetaminophen, ibuprofen, naproxen, or ketoprofen unless instructed by your doctor. These medicines may hide a fever. Do not become pregnant while taking this medicine. Women should inform their doctor if they wish to become pregnant or think they might be pregnant. There is a potential for serious side effects to an unborn child. Talk to your health care professional or pharmacist for more information. Do not  breast-feed an infant while taking this medicine. What side effects may I notice from receiving this medicine? Side effects that you should report to your doctor or health care professional as soon as possible: allergic reactions like skin rash, itching or hives, swelling of the face, lips, or tongue signs of infection - fever or chills, cough, sore throat, pain or difficulty passing urine signs of decreased platelets or bleeding - bruising, pinpoint red spots on the skin, black, tarry stools, nosebleeds signs of decreased red blood cells - unusually weak or tired, fainting spells, lightheadedness breathing problems changes in hearing changes in vision chest pain high blood pressure low blood counts - This drug may decrease the number of white blood cells, red blood  cells and platelets. You may be at increased risk for infections and bleeding. nausea and vomiting pain, swelling, redness or irritation at the injection site pain, tingling, numbness in the hands or feet problems with balance, talking, walking trouble passing urine or change in the amount of urine Side effects that usually do not require medical attention (report to your doctor or health care professional if they continue or are bothersome): hair loss loss of appetite metallic taste in the mouth or changes in taste This list may not describe all possible side effects. Call your doctor for medical advice about side effects. You may report side effects to FDA at 1-800-FDA-1088. Where should I keep my medicine? This drug is given in a hospital or clinic and will not be stored at home. NOTE: This sheet is a summary. It may not cover all possible information. If you have questions about this medicine, talk to your doctor, pharmacist, or health care provider.  2021 Elsevier/Gold Standard (2007-07-02 14:38:05) Blood Transfusion, Adult, Care After This sheet gives you information about how to care for yourself after your procedure.  Your doctor may also give you more specific instructions. If youhave problems or questions, contact your doctor. What can I expect after the procedure? After the procedure, it is common to have: Bruising and soreness at the IV site. A fever or chills on the day of the procedure. This may be your body's response to the new blood cells received. A headache. Follow these instructions at home: Insertion site care     Follow instructions from your doctor about how to take care of your insertion site. This is where an IV tube was put into your vein. Make sure you: Wash your hands with soap and water before and after you change your bandage (dressing). If you cannot use soap and water, use hand sanitizer. Change your bandage as told by your doctor. Check your insertion site every day for signs of infection. Check for: Redness, swelling, or pain. Bleeding from the site. Warmth. Pus or a bad smell. General instructions Take over-the-counter and prescription medicines only as told by your doctor. Rest as told by your doctor. Go back to your normal activities as told by your doctor. Keep all follow-up visits as told by your doctor. This is important. Contact a doctor if: You have itching or red, swollen areas of skin (hives). You feel worried or nervous (anxious). You feel weak after doing your normal activities. You have redness, swelling, warmth, or pain around the insertion site. You have blood coming from the insertion site, and the blood does not stop with pressure. You have pus or a bad smell coming from the insertion site. Get help right away if: You have signs of a serious reaction. This may be coming from an allergy or the body's defense system (immune system). Signs include: Trouble breathing or shortness of breath. Swelling of the face or feeling warm (flushed). Fever or chills. Head, chest, or back pain. Dark pee (urine) or blood in the pee. Widespread rash. Fast  heartbeat. Feeling dizzy or light-headed. You may receive your blood transfusion in an outpatient setting. If so, youwill be told whom to contact to report any reactions. These symptoms may be an emergency. Do not wait to see if the symptoms will go away. Get medical help right away. Call your local emergency services (911 in the [place] Do not drive yourself to the hospital. Summary Bruising and soreness at the IV site are common. Check your insertion  site every day for signs of infection. Rest as told by your doctor. Go back to your normal activities as told by your doctor. Get help right away if you have signs of a serious reaction. This information is not intended to replace advice given to you by your health care provider. Make sure you discuss any questions you have with your healthcare provider. Document Revised: 09/19/2018 Document Reviewed: 09/19/2018 Elsevier Patient Education  [place].

## 2020-09-24 LAB — TYPE AND SCREEN
ABO/RH(D): O POS
Antibody Screen: NEGATIVE
Unit division: 0
Unit division: 0

## 2020-09-24 LAB — BPAM RBC
Blood Product Expiration Date: 202207172359
Blood Product Expiration Date: 202207172359
ISSUE DATE / TIME: 202206161205
ISSUE DATE / TIME: 202206161205
Unit Type and Rh: 5100
Unit Type and Rh: 5100

## 2020-09-24 LAB — POCT INR: INR: 4.6 — AB (ref 2.0–3.0)

## 2020-09-27 ENCOUNTER — Ambulatory Visit (INDEPENDENT_AMBULATORY_CARE_PROVIDER_SITE_OTHER): Payer: Medicare HMO | Admitting: Cardiology

## 2020-09-27 ENCOUNTER — Other Ambulatory Visit: Payer: Self-pay | Admitting: Radiology

## 2020-09-27 ENCOUNTER — Telehealth: Payer: Self-pay

## 2020-09-27 DIAGNOSIS — Z952 Presence of prosthetic heart valve: Secondary | ICD-10-CM | POA: Diagnosis not present

## 2020-09-27 DIAGNOSIS — Z79899 Other long term (current) drug therapy: Secondary | ICD-10-CM | POA: Diagnosis not present

## 2020-09-27 NOTE — Telephone Encounter (Signed)
Returned call to pt's wife per her request. Wife states pt's feet are tender on the bottom and pt has bilat lower leg edema. Encouraged pt to elevate legs and use compression socks. Wife to call back if foot tenderness gets worse. Pt's wife verbalized understanding of elevation of legs and use of compression socks.

## 2020-09-28 ENCOUNTER — Ambulatory Visit (HOSPITAL_COMMUNITY)
Admission: RE | Admit: 2020-09-28 | Discharge: 2020-09-28 | Disposition: A | Discharge status: Home / Self Care | Payer: Medicare HMO | Source: Ambulatory Visit | Attending: Physician Assistant | Admitting: Physician Assistant

## 2020-09-28 ENCOUNTER — Encounter (HOSPITAL_COMMUNITY): Payer: Self-pay

## 2020-09-28 ENCOUNTER — Encounter: Payer: Self-pay | Admitting: Internal Medicine

## 2020-09-28 ENCOUNTER — Other Ambulatory Visit: Payer: Self-pay

## 2020-09-28 DIAGNOSIS — Z882 Allergy status to sulfonamides status: Secondary | ICD-10-CM | POA: Diagnosis not present

## 2020-09-28 DIAGNOSIS — Z79899 Other long term (current) drug therapy: Secondary | ICD-10-CM | POA: Diagnosis not present

## 2020-09-28 DIAGNOSIS — Z7901 Long term (current) use of anticoagulants: Secondary | ICD-10-CM | POA: Insufficient documentation

## 2020-09-28 DIAGNOSIS — C3490 Malignant neoplasm of unspecified part of unspecified bronchus or lung: Secondary | ICD-10-CM | POA: Diagnosis not present

## 2020-09-28 DIAGNOSIS — Z87891 Personal history of nicotine dependence: Secondary | ICD-10-CM | POA: Diagnosis not present

## 2020-09-28 DIAGNOSIS — Z880 Allergy status to penicillin: Secondary | ICD-10-CM | POA: Insufficient documentation

## 2020-09-28 DIAGNOSIS — Z452 Encounter for adjustment and management of vascular access device: Secondary | ICD-10-CM | POA: Diagnosis not present

## 2020-09-28 DIAGNOSIS — Z888 Allergy status to other drugs, medicaments and biological substances status: Secondary | ICD-10-CM | POA: Diagnosis not present

## 2020-09-28 HISTORY — PX: IR IMAGING GUIDED PORT INSERTION: IMG5740

## 2020-09-28 LAB — PROTIME-INR
INR: 2.5 — ABNORMAL HIGH (ref 0.8–1.2)
Prothrombin Time: 26.6 seconds — ABNORMAL HIGH (ref 11.4–15.2)

## 2020-09-28 MED ORDER — LIDOCAINE HCL 1 % IJ SOLN
INTRAMUSCULAR | Status: AC
  Filled 2020-09-28: qty 20

## 2020-09-28 MED ORDER — LIDOCAINE-EPINEPHRINE 1 %-1:100000 IJ SOLN
INTRAMUSCULAR | Status: AC | PRN
  Administered 2020-09-28: 20 mL

## 2020-09-28 MED ORDER — HEPARIN SOD (PORK) LOCK FLUSH 100 UNIT/ML IV SOLN
INTRAVENOUS | Status: AC
  Filled 2020-09-28: qty 5

## 2020-09-28 MED ORDER — FENTANYL CITRATE (PF) 100 MCG/2ML IJ SOLN
INTRAMUSCULAR | Status: AC | PRN
  Administered 2020-09-28 (×2): 50 ug via INTRAVENOUS

## 2020-09-28 MED ORDER — MIDAZOLAM HCL 2 MG/2ML IJ SOLN
INTRAMUSCULAR | Status: AC
  Filled 2020-09-28: qty 4

## 2020-09-28 MED ORDER — LIDOCAINE-EPINEPHRINE 1 %-1:100000 IJ SOLN
INTRAMUSCULAR | Status: AC
  Filled 2020-09-28: qty 1

## 2020-09-28 MED ORDER — SODIUM CHLORIDE 0.9 % IV SOLN: INTRAVENOUS | Status: DC

## 2020-09-28 MED ORDER — MIDAZOLAM HCL 2 MG/2ML IJ SOLN
INTRAMUSCULAR | Status: AC | PRN
  Administered 2020-09-28: 0.5 mg via INTRAVENOUS
  Administered 2020-09-28: 1 mg via INTRAVENOUS
  Administered 2020-09-28: 0.5 mg via INTRAVENOUS

## 2020-09-28 MED ORDER — HEPARIN SOD (PORK) LOCK FLUSH 100 UNIT/ML IV SOLN
INTRAVENOUS | Status: AC | PRN
  Administered 2020-09-28: 500 [IU] via INTRAVENOUS

## 2020-09-28 MED ORDER — FENTANYL CITRATE (PF) 100 MCG/2ML IJ SOLN
INTRAMUSCULAR | Status: AC
  Filled 2020-09-28: qty 2

## 2020-09-28 NOTE — H&P (Signed)
Chief Complaint: Patient was seen in consultation today for port placement at the request of [place]  Referring Physician(s): [name],[name]  Supervising Physician: Markus Daft  Patient Status: [facility] - Out-pt  History of Present Illness: Marcus [name] is a 75 y.o. male with recurrent lung cancer. He has been on chemotherapy but recently having more difficulty with venous access. He is referred for port placement. PMHx, meds, labs, imaging, allergies reviewed. Recent INR 4.6, pt states doing adjustment was made. Feels well, no recent fevers, chills, illness. Has been NPO today as directed.    Past Medical History:  Diagnosis Date   Atrial fibrillation ([place])    Dyspnea    with exertion    Dysrhythmia    Hypertension    Obstructive sleep apnea 10/25/2007   cpap   S/P AVR    2D ECHO, 04/25/2011 - EF >55%, Right ventricle-mild-moderately dilated   Swelling of limb    LEA VENOUS, 08/22/2009 - no evidence of deep vein or superficial thrombosis; partially rupturing Baker's Cyst    Past Surgical History:  Procedure Laterality Date   CARDIAC CATHETERIZATION Bilateral 05/10/2007   Significant 1-vessel disease, severely dilated aortic root with moderate severe aortic insufficiency   CARDIAC SURGERY     CARDIOVERSION  08/02/2007   150 Joule biphasic shock with restoration of sinus rhythm. Heart rate 60.   cataract surgery      COLONOSCOPY WITH PROPOFOL N/A 12/15/2016   Procedure: COLONOSCOPY WITH PROPOFOL;  Surgeon: Carol Ada, MD;  Location: [facility];  Service: Endoscopy;  Laterality: N/A;   EYE SURGERY     INTERCOSTAL NERVE BLOCK Right 12/08/2019   Procedure: INTERCOSTAL NERVE BLOCK;  Surgeon: [name], MD;  Location: [place];  Service: Thoracic;  Laterality: Right;   IR THORACENTESIS ASP PLEURAL SPACE W/IMG GUIDE  08/05/2020   NM MYOVIEW LTD  04/08/2007   No evidence of inducible myocardial ischemia   NODE DISSECTION Right 12/08/2019    Procedure: NODE DISSECTION;  Surgeon: [name], MD;  Location: [place];  Service: Thoracic;  Laterality: Right;   THYROIDECTOMY, PARTIAL     torn meniscus in right knee surgery       Allergies: Amlodipine, Sulfa antibiotics, Tetanus toxoids, Crestor [rosuvastatin], and Penicillins  Medications: Prior to Admission medications   Medication Sig Start Date End Date Taking? Authorizing Provider  atorvastatin (LIPITOR) 80 MG tablet TAKE 1 TABLET BY MOUTH EVERY DAY Patient taking differently: Take 80 mg by mouth daily. 07/28/20  Yes Troy Sine, MD  cetirizine (ZYRTEC) 10 MG tablet Take 10 mg by mouth daily.   Yes [provider]  folic acid (FOLVITE) 1 MG tablet Take 1 tablet (1 mg total) by mouth daily. 08/25/20  Yes [name], [name], PA-C  Multiple Vitamins-Minerals (EYE SUPPORT PO) Take 1 tablet by mouth daily. preservision   Yes [provider]  ramipril (ALTACE) 10 MG capsule TAKE 1 CAPSULE BY MOUTH EVERY DAY Patient taking differently: Take 10 mg by mouth daily. 08/10/20  Yes [name], Mihai, MD  warfarin (COUMADIN) 10 MG tablet TAKE 1 TO 1 AND 1/2 TABLETS DAILY AS DIRECTED BY COUMADIN CLINIC 09/15/20  Yes [name], Mihai, MD  ALPRAZolam (XANAX) 0.25 MG tablet Take 1 tablet (0.25 mg total) by mouth every 8 (eight) hours as needed for up to 20 doses for anxiety. 08/13/20   [name], MD  enoxaparin (LOVENOX) 120 MG/0.8ML injection Inject 0.8 mLs (120 mg total) into the skin 1 tonight and 2  tomorrow AM/PM 09/15/20   [name], [name], MD  HYDROcodone-acetaminophen (NORCO/VICODIN) 5-325 MG tablet Take 1 tablet by mouth every 4 (four) hours as needed for moderate pain. 09/15/20   [name], Mihai, MD  lidocaine-prilocaine (EMLA) cream Apply 1 application topically as needed. Patient not taking: Reported on 09/23/2020 09/15/20   [name], [name], PA-C  prochlorperazine (COMPAZINE) 10 MG tablet Take 1 tablet (10 mg total) by mouth every 6 (six) hours as  needed. 08/25/20   [name], [name], PA-C     Family History  Problem Relation Age of Onset   Cancer Father     Social History   Socioeconomic History   Marital status: Married    Spouse name: diane   Number of children: Not on file   Years of education: Not on file   Highest education level: Not on file  Occupational History   Occupation: retired  Tobacco Use   Smoking status: Former    Pack years: 0.00    Types: Cigarettes    Quit date: 04/11/1987    Years since quitting: 33.4   Smokeless tobacco: Never  Vaping Use   Vaping Use: Never used  Substance and Sexual Activity   Alcohol use: No   Drug use: No   Sexual activity: Not on file  Other Topics Concern   Not on file  Social History Narrative   Not on file   Social Determinants of Health   Financial Resource Strain: Low Risk    Difficulty of Paying Living Expenses: Not hard at all  Food Insecurity: No Food Insecurity   Worried About Charity fundraiser in the Last Year: Never true   [place] in the Last Year: Never true  Transportation Needs: No Transportation Needs   Lack of Transportation (Medical): No   Lack of Transportation (Non-Medical): No  Physical Activity: Insufficiently Active   Days of Exercise per Week: 3 days   Minutes of Exercise per Session: 40 min  Stress: No Stress Concern Present   Feeling of Stress : Not at all  Social Connections: Moderately Integrated   Frequency of Communication with Friends and Family: More than three times a week   Frequency of Social Gatherings with Friends and Family: Never   Attends Religious Services: 1 to 4 times per year   Active Member of [employer] or Organizations: No   Attends Archivist Meetings: Never   Marital Status: Married     Review of Systems: A 12 point ROS discussed and pertinent positives are indicated in the HPI above.  All other systems are negative.  Review of Systems  Vital Signs: BP 124/67   Pulse 82   Temp  98.1 F (36.7 C) (Oral)   Resp 16   Ht 5\' 11"  (1.803 m)   Wt 72.6 kg   SpO2 97%   BMI 22.32 kg/m   Physical Exam Constitutional:      Appearance: Normal appearance.  HENT:     Mouth/Throat:     Mouth: Mucous membranes are moist.     Pharynx: Oropharynx is clear.  Cardiovascular:     Rate and Rhythm: Normal rate and regular rhythm.     Heart sounds: Normal heart sounds.  Pulmonary:     Effort: Pulmonary effort is normal. No respiratory distress.     Breath sounds: Normal breath sounds.  Skin:    General: Skin is warm and dry.  Neurological:     General: No focal deficit present.  Mental Status: He is alert and oriented to person, place, and time.  Psychiatric:        Mood and Affect: Mood normal.        Thought Content: Thought content normal.        Judgment: Judgment normal.     Imaging: DG Chest 2 View  Result Date: 09/01/2020 CLINICAL DATA:  75 year old male with a history of chest pain. Imaging history thoracic metastatic disease, pleural involvement, carcinomatosis EXAM: CHEST - 2 VIEW COMPARISON:  PET-CT 08/20/2020 FINDINGS: Cardiomediastinal silhouette unchanged. Surgical changes of median sternotomy, CABG, aortic valve repair. Persisting opacity at the right lung base obscuring the right hemidiaphragm and the right heart border. Pleuroparenchymal thickening of the right-sided pleural interface. Improved appearance of the lentiform opacities in the right mid lung and infrahilar region, with persistent thickening of the fissure. Reticular opacities throughout the bilateral lungs, similar to the prior. No evidence of interlobular septal thickening to indicate edema. No pneumothorax. Left lung relatively unchanged with no new confluent airspace disease. Degenerative changes of the spine.  No acute displaced fracture. IMPRESSION: No acute change in the appearance of the chest x-ray, with plain film demonstration of thoracic metastatic disease better characterized on recent  PET-CT, including extensive right-sided pleural involvement potentially a source of pain. Surgical changes of median sternotomy, CABG, aortic valve repair Electronically Signed   By: Corrie Mckusick D.O.   On: 09/01/2020 09:36   MR Brain W Wo Contrast  Result Date: 09/03/2020 CLINICAL DATA:  Non-small-cell lung cancer staging EXAM: MRI HEAD WITHOUT AND WITH CONTRAST TECHNIQUE: Multiplanar, multiecho pulse sequences of the brain and surrounding structures were obtained without and with intravenous contrast. CONTRAST:  29mL GADAVIST GADOBUTROL 1 MMOL/ML IV SOLN COMPARISON:  MRI head 12/18/2019 FINDINGS: Brain: Negative for metastatic disease to brain. No enhancing mass lesion is present. Generalized atrophy. Extensive white matter changes with periventricular deep white matter hyperintensities bilaterally, comparable to the prior study. Small chronic infarct right cerebellum. Brainstem intact. Negative for acute infarct Innumerable areas of microhemorrhage throughout both cerebral hemispheres. There is evidence of prior subarachnoid hemorrhage with superficial siderosis in the posterior fossa and around both cerebral hemispheres. Findings are unchanged from the prior study. Vascular: Normal arterial flow voids Skull and upper cervical spine: No focal skeletal abnormality. Sinuses/Orbits: Paranasal sinuses clear. Bilateral cataract extraction Other: None IMPRESSION: Negative for metastatic disease to brain Generalized atrophy. Extensive chronic microvascular ischemic changes. No acute infarct Innumerable areas of chronic microhemorrhage in both cerebral hemispheres with evidence of superficial siderosis, unchanged from the prior study. Findings compatible with cerebral amyloid. Electronically Signed   By: Franchot Gallo M.D.   On: 09/03/2020 16:45   CT Biopsy  Result Date: 09/07/2020 INDICATION: 75 year old gentleman with history of non-small cell lung cancer presents today measure radiology for biopsy of FDG avid  pleural mass. EXAM: CT-guided biopsy of FDG avid pleural mass. MEDICATIONS: None. ANESTHESIA/SEDATION: Moderate (conscious) sedation was employed during this procedure. A total of Versed 1 mg and Fentanyl 50 mcg was administered intravenously. Moderate Sedation Time: 17 minutes. The patient's level of consciousness and vital signs were monitored continuously by radiology nursing throughout the procedure under my direct supervision. COMPLICATIONS: None immediate. PROCEDURE: Informed written consent was obtained from the patient after a thorough discussion of the procedural risks, benefits and alternatives. All questions were addressed. Maximal Sterile Barrier Technique was utilized including caps, mask, sterile gowns, sterile gloves, sterile drape, hand hygiene and skin antiseptic. A timeout was performed prior to the initiation of  the procedure. Patient position prone on the procedure table. The posterior chest wall skin prepped and draped in usual fashion. Following local lidocaine administration, 17 gauge introducer needle was advanced into the region of abnormal pleural thickening and 4-18 gauge cores were obtained. All samples were provided to pathology in formalin. Patient on the procedure well. No pneumothorax seen on postprocedure CT. IMPRESSION: CT-guided biopsy of FDG avid right pleural mass. Electronically Signed   By: Miachel Roux M.D.   On: 09/07/2020 12:51   DG Chest Port 1 View  Result Date: 09/07/2020 CLINICAL DATA:  Status post lung biopsy. EXAM: PORTABLE CHEST 1 VIEW COMPARISON:  CT biopsy images 09/07/2020.  Chest x-ray 08/30/2020. FINDINGS: Prior median sternotomy and cardiac valve replacement. Stable cardiomegaly. Low lung volumes stable right-sided pleuroparenchymal thickening. No pneumothorax post biopsy. IMPRESSION: No pneumothorax post biopsy. Electronically Signed   By: Marcello Moores  Register   On: 09/07/2020 10:23   ECHOCARDIOGRAM COMPLETE  Result Date: 09/09/2020    ECHOCARDIOGRAM REPORT    Patient Name:   KYRESE GARTMAN [place] Date of Exam: 09/09/2020 Medical Rec #:  [id number]     Height:       71.0 in Accession #:    [id number]    Weight:       179.0 lb Date of Birth:  Aug 09, 1945      BSA:          2.011 m Patient Age:    23 years      BP:           122/66 mmHg Patient Gender: M             HR:           61 bpm. Exam Location:  [place] Procedure: 2D Echo, Cardiac Doppler and Color Doppler Indications:    Z95.2 History of mechanical aortic valve replacement  History:        Patient has prior history of Echocardiogram examinations, most                 recent 11/25/2018. CAD, COPD, Arrythmias:Atrial Fibrillation;                 Risk Factors:Hypertension and Dyslipidemia. S/P thoracic aortic                 aneurysm repair. Lung cancer. Bradycardia.                 Aortic Valve: 23 mm St. Jude bileaflet valve is present in the                 aortic position.  Sonographer:    Diamond Nickel RCS Referring Phys: [place]  1. Left ventricular ejection fraction, by estimation, is 60 to 65%. The left ventricle has normal function. The left ventricle has no regional wall motion abnormalities. There is mild concentric left ventricular hypertrophy. Left ventricular diastolic parameters are indeterminate.  2. Right ventricular systolic function is normal. The right ventricular size is normal.  3. The mitral valve is normal in structure. Trivial mitral valve regurgitation. No evidence of mitral stenosis.  4. The aortic valve has been repaired/replaced. Aortic valve regurgitation is trivial. There is a 23 mm St. Jude bileaflet valve present in the aortic position. Aortic valve mean gradient measures 13.3 mmHg.  5. The inferior vena cava is normal in size with greater than 50% respiratory variability, suggesting right atrial pressure of 3 mmHg. Comparison(s): No significant change from prior study. FINDINGS  Left Ventricle: Left ventricular ejection fraction, by estimation, is 60 to 65%. The left  ventricle has normal function. The left ventricle has no regional wall motion abnormalities. The left ventricular internal cavity size was normal in size. There is  mild concentric left ventricular hypertrophy. Left ventricular diastolic parameters are indeterminate. Right Ventricle: The right ventricular size is normal. Right vetricular wall thickness was not well visualized. Right ventricular systolic function is normal. Left Atrium: Left atrial size was normal in size. Right Atrium: Right atrial size was normal in size. Pericardium: There is no evidence of pericardial effusion. Mitral Valve: The mitral valve is normal in structure. Trivial mitral valve regurgitation. No evidence of mitral valve stenosis. Tricuspid Valve: The tricuspid valve is normal in structure. Tricuspid valve regurgitation is trivial. No evidence of tricuspid stenosis. Aortic Valve: The aortic valve has been repaired/replaced. Aortic valve regurgitation is trivial. Aortic valve mean gradient measures 13.3 mmHg. Aortic valve peak gradient measures 31.9 mmHg. Aortic valve area, by VTI measures 1.41 cm. There is a 23 mm St. Jude bileaflet valve present in the aortic position. Pulmonic Valve: The pulmonic valve was grossly normal. Pulmonic valve regurgitation is mild to moderate. Aorta: The aortic root, ascending aorta, aortic arch and descending aorta are all structurally normal, with no evidence of dilitation or obstruction. Venous: The inferior vena cava is normal in size with greater than 50% respiratory variability, suggesting right atrial pressure of 3 mmHg. IAS/Shunts: The interatrial septum appears to be lipomatous. The atrial septum is grossly normal.  LEFT VENTRICLE PLAX 2D LVIDd:         4.30 cm  Diastology LVIDs:         2.30 cm  LV e' medial:    5.77 cm/s LV PW:         1.50 cm  LV E/e' medial:  12.7 LV IVS:        1.40 cm  LV e' lateral:   8.59 cm/s LVOT diam:     2.10 cm  LV E/e' lateral: 8.5 LV SV:         67 LV SV Index:   33  LVOT Area:     3.46 cm  RIGHT VENTRICLE RV Basal diam:  3.20 cm RV S prime:     11.90 cm/s TAPSE (M-mode): 1.3 cm LEFT ATRIUM             Index       RIGHT ATRIUM           Index LA diam:        3.80 cm 1.89 cm/m  RA Area:     15.00 cm LA Vol (A2C):   64.0 ml 31.82 ml/m RA Volume:   36.70 ml  18.25 ml/m LA Vol (A4C):   33.0 ml 16.41 ml/m LA Biplane Vol: 46.5 ml 23.12 ml/m  AORTIC VALVE AV Area (Vmax):    1.21 cm AV Area (Vmean):   1.28 cm AV Area (VTI):     1.41 cm AV Vmax:           282.33 cm/s AV Vmean:          165.000 cm/s AV VTI:            0.471 m AV Peak Grad:      31.9 mmHg AV Mean Grad:      13.3 mmHg LVOT Vmax:         98.60 cm/s LVOT Vmean:        61.000 cm/s LVOT VTI:  0.192 m LVOT/AV VTI ratio: 0.41 MITRAL VALVE MV Area (PHT): 2.91 cm    SHUNTS MV Decel Time: 261 msec    Systemic VTI:  0.19 m MV E velocity: 73.30 cm/s  Systemic Diam: 2.10 cm MV A velocity: 83.10 cm/s MV E/A ratio:  0.88 Buford Dresser MD Electronically signed by Buford Dresser MD Signature Date/Time: 09/09/2020/1:26:16 PM    Final     Labs:  CBC: Recent Labs    09/07/20 0086 09/09/20 0906 09/17/20 0919 09/23/20 0806  WBC 5.6 3.8* 3.6* 2.4*  HGB 9.2* 8.5* 8.0* 7.9*  HCT 29.0* 26.7* 24.9* 24.6*  PLT 186 147* 117* 182    COAGS: Recent Labs    12/04/19 1300 12/09/19 0101 09/15/20 0950 09/17/20 0000 09/20/20 1137 09/24/20 1009  INR 1.2   < > 1.4* 3.5* 3.7* 4.6*  APTT 49*  --   --   --   --   --    < > = values in this interval not displayed.    BMP: Recent Labs    12/09/19 0101 12/10/19 0114 12/15/19 0158 01/01/20 1227 01/28/20 1324 09/03/20 0825 09/09/20 0906 09/17/20 0919 09/23/20 0806  NA 138 138 137 140   < > 139 135 138 133*  K 3.9 4.1 4.4 4.3   < > 3.8 4.1 4.4 4.2  CL 106 103 104 106   < > 105 102 103 101  CO2 23 27 27 31    < > 25 24 27 24   GLUCOSE 130* 98 108* 81   < > 115* 98 104* 223*  BUN 10 10 12 13    < > 15 21 18  28*  CALCIUM 8.0* 8.1* 7.9* 8.8*   < >  8.7* 8.2* 8.6* 8.4*  CREATININE 0.81 1.06 0.74 0.84   < > 0.88 0.91 0.92 0.94  GFRNONAA >60 >60 >60 >60   < > >60 >60 >60 >60  GFRAA >60 >60 >60 >60  --   --   --   --   --    < > = values in this interval not displayed.    LIVER FUNCTION TESTS: Recent Labs    09/03/20 0825 09/09/20 0906 09/17/20 0919 09/23/20 0806  BILITOT 0.5 0.6 0.3 0.4  AST 27 28 55* 29  ALT 18 19 62* 34  ALKPHOS 114 113 118 101  PROT 7.0 6.7 6.8 6.5  ALBUMIN 2.9* 2.8* 2.8* 3.2*    TUMOR MARKERS: No results for input(s): AFPTM, CEA, CA199, CHROMGRNA in the last 8760 hours.  Assessment and Plan: Rent lung cancer Poor venous access. For port placement Recent INR >4, will recheck today Risks and benefits of image guided port-a-catheter placement was discussed with the patient including, but not limited to bleeding, infection, pneumothorax, or fibrin sheath development and need for additional procedures.  All of the patient's questions were answered, patient is agreeable to proceed. Consent signed and in chart.   Thank you for this interesting consult.  I greatly enjoyed meeting ALYX GEE and look forward to participating in their care.  A copy of this report was sent to the requesting provider on this date.  Electronically Signed: Ascencion Dike, PA-C 09/28/2020, 1:06 PM   I spent a total of 20 minutes in face to face in clinical consultation, greater than 50% of which was counseling/coordinating care for port

## 2020-09-28 NOTE — Procedures (Signed)
Interventional Radiology Procedure:   Indications: Lung cancer  Procedure: Port placement  Findings: Right jugular port, tip at SVC/RA junction  Complications: None     EBL: Minimal, less than 10 ml  Plan: Discharge in one hour.  Keep port site and incisions dry for at least 24 hours.     Inika Bellanger R. Anselm Pancoast, MD  Pager: [phone]

## 2020-09-28 NOTE — Discharge Instructions (Signed)

## 2020-09-28 NOTE — Progress Notes (Signed)
[name] Shiner (spouse) notified of procedure delay.

## 2020-09-29 ENCOUNTER — Other Ambulatory Visit: Payer: Self-pay

## 2020-09-29 ENCOUNTER — Encounter (HOSPITAL_COMMUNITY): Payer: Self-pay | Admitting: Emergency Medicine

## 2020-09-29 ENCOUNTER — Emergency Department (HOSPITAL_COMMUNITY)
Admission: EM | Admit: 2020-09-29 | Discharge: 2020-09-30 | Disposition: A | Discharge status: Home / Self Care | Payer: Medicare HMO | Attending: Emergency Medicine | Admitting: Emergency Medicine

## 2020-09-29 DIAGNOSIS — Z79899 Other long term (current) drug therapy: Secondary | ICD-10-CM | POA: Diagnosis not present

## 2020-09-29 DIAGNOSIS — C3490 Malignant neoplasm of unspecified part of unspecified bronchus or lung: Secondary | ICD-10-CM | POA: Diagnosis not present

## 2020-09-29 DIAGNOSIS — I10 Essential (primary) hypertension: Secondary | ICD-10-CM | POA: Insufficient documentation

## 2020-09-29 DIAGNOSIS — K4090 Unilateral inguinal hernia, without obstruction or gangrene, not specified as recurrent: Secondary | ICD-10-CM

## 2020-09-29 DIAGNOSIS — K432 Incisional hernia without obstruction or gangrene: Secondary | ICD-10-CM | POA: Diagnosis not present

## 2020-09-29 DIAGNOSIS — I4891 Unspecified atrial fibrillation: Secondary | ICD-10-CM | POA: Insufficient documentation

## 2020-09-29 DIAGNOSIS — K5750 Diverticulosis of both small and large intestine without perforation or abscess without bleeding: Secondary | ICD-10-CM | POA: Diagnosis not present

## 2020-09-29 DIAGNOSIS — Z7901 Long term (current) use of anticoagulants: Secondary | ICD-10-CM | POA: Insufficient documentation

## 2020-09-29 DIAGNOSIS — Z87891 Personal history of nicotine dependence: Secondary | ICD-10-CM | POA: Diagnosis not present

## 2020-09-29 DIAGNOSIS — I723 Aneurysm of iliac artery: Secondary | ICD-10-CM | POA: Diagnosis not present

## 2020-09-29 NOTE — ED Triage Notes (Signed)
Pt arriving with concern for swollen area on left groin area. Pt recently had chemo for lung cancer last Thursday and port placed on Tuesday.

## 2020-09-30 ENCOUNTER — Other Ambulatory Visit: Payer: Medicare HMO

## 2020-09-30 ENCOUNTER — Inpatient Hospital Stay: Payer: Medicare HMO

## 2020-09-30 ENCOUNTER — Encounter (HOSPITAL_COMMUNITY): Payer: Self-pay

## 2020-09-30 ENCOUNTER — Emergency Department (HOSPITAL_COMMUNITY): Payer: Medicare HMO

## 2020-09-30 DIAGNOSIS — I723 Aneurysm of iliac artery: Secondary | ICD-10-CM | POA: Diagnosis not present

## 2020-09-30 DIAGNOSIS — K4090 Unilateral inguinal hernia, without obstruction or gangrene, not specified as recurrent: Secondary | ICD-10-CM | POA: Diagnosis not present

## 2020-09-30 DIAGNOSIS — K5750 Diverticulosis of both small and large intestine without perforation or abscess without bleeding: Secondary | ICD-10-CM | POA: Diagnosis not present

## 2020-09-30 DIAGNOSIS — K432 Incisional hernia without obstruction or gangrene: Secondary | ICD-10-CM | POA: Diagnosis not present

## 2020-09-30 LAB — CBC WITH DIFFERENTIAL/PLATELET
Abs Immature Granulocytes: 0.03 10*3/uL (ref 0.00–0.07)
Basophils Absolute: 0 10*3/uL (ref 0.0–0.1)
Basophils Relative: 0 %
Eosinophils Absolute: 0 10*3/uL (ref 0.0–0.5)
Eosinophils Relative: 0 %
HCT: 35.3 % — ABNORMAL LOW (ref 39.0–52.0)
Hemoglobin: 11.3 g/dL — ABNORMAL LOW (ref 13.0–17.0)
Immature Granulocytes: 1 %
Lymphocytes Relative: 24 %
Lymphs Abs: 0.8 10*3/uL (ref 0.7–4.0)
MCH: 29.6 pg (ref 26.0–34.0)
MCHC: 32 g/dL (ref 30.0–36.0)
MCV: 92.4 fL (ref 80.0–100.0)
Monocytes Absolute: 0.2 10*3/uL (ref 0.1–1.0)
Monocytes Relative: 5 %
Neutro Abs: 2.4 10*3/uL (ref 1.7–7.7)
Neutrophils Relative %: 70 %
Platelets: 92 10*3/uL — ABNORMAL LOW (ref 150–400)
RBC: 3.82 MIL/uL — ABNORMAL LOW (ref 4.22–5.81)
RDW: 16.5 % — ABNORMAL HIGH (ref 11.5–15.5)
WBC: 3.5 10*3/uL — ABNORMAL LOW (ref 4.0–10.5)
nRBC: 0 % (ref 0.0–0.2)

## 2020-09-30 LAB — BASIC METABOLIC PANEL
Anion gap: 7 (ref 5–15)
BUN: 21 mg/dL (ref 8–23)
CO2: 24 mmol/L (ref 22–32)
Calcium: 8.2 mg/dL — ABNORMAL LOW (ref 8.9–10.3)
Chloride: 103 mmol/L (ref 98–111)
Creatinine, Ser: 0.82 mg/dL (ref 0.61–1.24)
GFR, Estimated: >60 mL/min (ref >60)
Glucose, Bld: 100 mg/dL — ABNORMAL HIGH (ref 70–99)
Potassium: 4.6 mmol/L (ref 3.5–5.1)
Sodium: 134 mmol/L — ABNORMAL LOW (ref 135–145)

## 2020-09-30 MED ORDER — SODIUM CHLORIDE (PF) 0.9 % IJ SOLN
INTRAMUSCULAR | Status: AC
  Filled 2020-09-30: qty 50

## 2020-09-30 MED ORDER — IOHEXOL 300 MG/ML  SOLN
100.0000 mL | Freq: Once | INTRAMUSCULAR | Status: AC | PRN
  Administered 2020-09-30: 100 mL via INTRAVENOUS

## 2020-09-30 NOTE — Progress Notes (Signed)
Pt and spouse presented to [facility] asking to speak with "a nurse for Dr. Julien Nordmann". This RN discussed with them in lobby of [place] his recent visit to ED for inguinal mass. This RN advised that they follow-up with surgery center as given to them in discharge instructions. Pt's wife verbalizes understanding and agrees with plan of care.

## 2020-09-30 NOTE — ED Notes (Signed)
Pt verbalized understanding of d/c and follow up care. Ambulatory with steady gait.  

## 2020-09-30 NOTE — ED Provider Notes (Signed)
[facility] DEPT Provider Note: Georgena Spurling, MD, FACEP  CSN: [id number] MRN: [id number] ARRIVAL: 09/29/20 at 2213 ROOM: WA11/WA11   CHIEF COMPLAINT  Groin Swelling   HISTORY OF PRESENT ILLNESS  09/30/20 1:19 AM Marcus [name] is a 75 y.o. male who is undergoing chemotherapy for lung cancer.  He had a Port-A-Cath placed 2 days ago.  He is here with a left inguinal mass that he first noticed 3 days ago.  It is currently not painful because he is lying supine but it does become painful during the active sitting or standing up.  When standing it is sometimes painful but often painless.  He rates his pain as an 8 out of 10.  He has no history of an inguinal hernia.  He denies nausea, vomiting or change in bowel habits.  He has had some ankle edema but states this was expected with his chemotherapy.   Past Medical History:  Diagnosis Date   Atrial fibrillation ([place])    Dyspnea    with exertion    Dysrhythmia    Hypertension    Obstructive sleep apnea 10/25/2007   cpap   S/P AVR    2D ECHO, 04/25/2011 - EF >55%, Right ventricle-mild-moderately dilated   Swelling of limb    LEA VENOUS, 08/22/2009 - no evidence of deep vein or superficial thrombosis; partially rupturing Baker's Cyst    Past Surgical History:  Procedure Laterality Date   CARDIAC CATHETERIZATION Bilateral 05/10/2007   Significant 1-vessel disease, severely dilated aortic root with moderate severe aortic insufficiency   CARDIAC SURGERY     CARDIOVERSION  08/02/2007   150 Joule biphasic shock with restoration of sinus rhythm. Heart rate 60.   cataract surgery      COLONOSCOPY WITH PROPOFOL N/A 12/15/2016   Procedure: COLONOSCOPY WITH PROPOFOL;  Surgeon: Carol Ada, MD;  Location: [facility];  Service: Endoscopy;  Laterality: N/A;   EYE SURGERY     INTERCOSTAL NERVE BLOCK Right 12/08/2019   Procedure: INTERCOSTAL NERVE BLOCK;  Surgeon: [name], MD;  Location: [facility];  Service: Thoracic;  Laterality: Right;    IR IMAGING GUIDED PORT INSERTION  09/28/2020   IR THORACENTESIS ASP PLEURAL SPACE W/IMG GUIDE  08/05/2020   NM MYOVIEW LTD  04/08/2007   No evidence of inducible myocardial ischemia   NODE DISSECTION Right 12/08/2019   Procedure: NODE DISSECTION;  Surgeon: [name], MD;  Location: [facility];  Service: Thoracic;  Laterality: Right;   THYROIDECTOMY, PARTIAL     torn meniscus in right knee surgery       Family History  Problem Relation Age of Onset   Cancer Father     Social History   Tobacco Use   Smoking status: Former    Pack years: 0.00    Types: Cigarettes    Quit date: 04/11/1987    Years since quitting: 33.4   Smokeless tobacco: Never  Vaping Use   Vaping Use: Never used  Substance Use Topics   Alcohol use: No   Drug use: No    Prior to Admission medications   Medication Sig Start Date End Date Taking? Authorizing Provider  ALPRAZolam (XANAX) 0.25 MG tablet Take 1 tablet (0.25 mg total) by mouth every 8 (eight) hours as needed for up to 20 doses for anxiety. 08/13/20   [name], MD  atorvastatin (LIPITOR) 80 MG tablet TAKE 1 TABLET BY MOUTH EVERY DAY Patient taking differently: Take 80 mg by mouth daily. 07/28/20  Troy Sine, MD  cetirizine (ZYRTEC) 10 MG tablet Take 10 mg by mouth daily.    [provider]  enoxaparin (LOVENOX) 120 MG/0.8ML injection Inject 0.8 mLs (120 mg total) into the skin 1 tonight and 2 tomorrow AM/PM 09/15/20   [name], [name], MD  folic acid (FOLVITE) 1 MG tablet Take 1 tablet (1 mg total) by mouth daily. 08/25/20   [name], [name], PA-C  HYDROcodone-acetaminophen (NORCO/VICODIN) 5-325 MG tablet Take 1 tablet by mouth every 4 (four) hours as needed for moderate pain. 09/15/20   [name], Mihai, MD  lidocaine-prilocaine (EMLA) cream Apply 1 application topically as needed. Patient not taking: Reported on 09/23/2020 09/15/20   [name], [name], PA-C  Multiple Vitamins-Minerals (EYE SUPPORT PO) Take 1  tablet by mouth daily. preservision    [provider]  prochlorperazine (COMPAZINE) 10 MG tablet Take 1 tablet (10 mg total) by mouth every 6 (six) hours as needed. 08/25/20   [name], [name], PA-C  ramipril (ALTACE) 10 MG capsule TAKE 1 CAPSULE BY MOUTH EVERY DAY Patient taking differently: Take 10 mg by mouth daily. 08/10/20   [name], Mihai, MD  warfarin (COUMADIN) 10 MG tablet TAKE 1 TO 1 AND 1/2 TABLETS DAILY AS DIRECTED BY COUMADIN CLINIC 09/15/20   [name], Mihai, MD    Allergies Amlodipine, Sulfa antibiotics, Tetanus toxoids, Crestor [rosuvastatin], and Penicillins   REVIEW OF SYSTEMS  Negative except as noted here or in the History of Present Illness.   PHYSICAL EXAMINATION  Initial Vital Signs Blood pressure 116/90, pulse 75, temperature 98.3 F (36.8 C), temperature source Oral, resp. rate 20, SpO2 97 %.  Examination General: Well-developed, well-nourished male in no acute distress; appearance consistent with age of record HENT: normocephalic; atraumatic Eyes: pupils equal, round and reactive to light; extraocular muscles intact Neck: supple Heart: regular rate and rhythm Lungs: clear to auscultation bilaterally Chest: Port-A-Cath right upper chest with well-healing surgical incision Abdomen: soft; nondistended; nontender; bowel sounds present GU: Tanner V male, circumcised; left inguinal mass, mildly tender Extremities: No deformity; full range of motion; pulses normal; trace edema of ankles and feet Neurologic: Awake, alert and oriented; motor function intact in all extremities and symmetric; no facial droop Skin: Warm and dry Psychiatric: Normal mood and affect   RESULTS  Summary of this visit's results, reviewed and interpreted by myself:   EKG Interpretation  Date/Time:    Ventricular Rate:    PR Interval:    QRS Duration:   QT Interval:    QTC Calculation:   R Axis:     Text Interpretation:          Laboratory Studies: Results  for orders placed or performed during the hospital encounter of 09/29/20 (from the past 24 hour(s))  CBC with Differential/Platelet     Status: Abnormal   Collection Time: 09/30/20  1:33 AM  Result Value Ref Range   WBC 3.5 (L) 4.0 - 10.5 K/uL   RBC 3.82 (L) 4.22 - 5.81 MIL/uL   Hemoglobin 11.3 (L) 13.0 - 17.0 g/dL   HCT 35.3 (L) 39.0 - 52.0 %   MCV 92.4 80.0 - 100.0 fL   MCH 29.6 26.0 - 34.0 pg   MCHC 32.0 30.0 - 36.0 g/dL   RDW 16.5 (H) 11.5 - 15.5 %   Platelets 92 (L) 150 - 400 K/uL   nRBC 0.0 0.0 - 0.2 %   Neutrophils Relative % 70 %   Neutro Abs 2.4 1.7 - 7.7 K/uL   Lymphocytes Relative 24 %  Lymphs Abs 0.8 0.7 - 4.0 K/uL   Monocytes Relative 5 %   Monocytes Absolute 0.2 0.1 - 1.0 K/uL   Eosinophils Relative 0 %   Eosinophils Absolute 0.0 0.0 - 0.5 K/uL   Basophils Relative 0 %   Basophils Absolute 0.0 0.0 - 0.1 K/uL   Immature Granulocytes 1 %   Abs Immature Granulocytes 0.03 0.00 - 0.07 K/uL  Basic metabolic panel     Status: Abnormal   Collection Time: 09/30/20  1:33 AM  Result Value Ref Range   Sodium 134 (L) 135 - 145 mmol/L   Potassium 4.6 3.5 - 5.1 mmol/L   Chloride 103 98 - 111 mmol/L   CO2 24 22 - 32 mmol/L   Glucose, Bld 100 (H) 70 - 99 mg/dL   BUN 21 8 - 23 mg/dL   Creatinine, Ser 0.82 0.61 - 1.24 mg/dL   Calcium 8.2 (L) 8.9 - 10.3 mg/dL   GFR, Estimated >60 >60 mL/min   Anion gap 7 5 - 15   Imaging Studies: CT PELVIS W CONTRAST  Result Date: 09/30/2020 CLINICAL DATA:  Abdominal pain with hernia suspected. Evaluate for left inguinal hernia. EXAM: CT PELVIS WITH CONTRAST TECHNIQUE: Multidetector CT imaging of the pelvis was performed using the standard protocol following the bolus administration of intravenous contrast. CONTRAST:  153mL OMNIPAQUE IOHEXOL 300 MG/ML  SOLN COMPARISON:  Abdominal CT 04/07/2006.  Head CT 08/20/2020 FINDINGS: Urinary Tract:  Collapsed bladder.  No explanation for symptoms Bowel: Left colonic diverticulosis. No visible obstruction  or inflammation Vascular/Lymphatic: Atherosclerosis with aortic and iliac tortuosity and chronic aneurysm at the left common iliac bifurcation which measures 2.7 cm in diameter on coronal reformats. The aneurysm is primarily fusiform but does have a proximally projected saccular component which is smaller. Reproductive:  Negative Other: Fatty left inguinal hernia. The herniated fat has a stranded appearance with discrete rim when compared to prior PET CT or fat herniation was very subtle. There is fatty expansion of the right inguinal canal without discrete hernia. Musculoskeletal: Lumbar spine degeneration. IMPRESSION: Fatty left inguinal hernia. The herniated fat appears congested since prior, suggesting fat ischemia/infarct. Electronically Signed   By: Monte Fantasia M.D.   On: 09/30/2020 04:17   IR IMAGING GUIDED PORT INSERTION  Result Date: 09/28/2020 INDICATION: 75 year old with non-small cell lung cancer. Patient has poor venous access and request for a Port-A-Cath. EXAM: FLUOROSCOPIC AND ULTRASOUND GUIDED PLACEMENT OF A SUBCUTANEOUS PORT COMPARISON:  None. MEDICATIONS: Moderate sedation ANESTHESIA/SEDATION: Versed 2.0 mg IV; Fentanyl 100 mcg IV; Moderate Sedation Time:  30 minutes The patient was continuously monitored during the procedure by the interventional radiology nurse under my direct supervision. FLUOROSCOPY TIME:  18 seconds, 2 mGy COMPLICATIONS: None immediate. PROCEDURE: The procedure, risks, benefits, and alternatives were explained to the patient. Questions regarding the procedure were encouraged and answered. The patient understands and consents to the procedure. Patient was placed supine on the interventional table. Ultrasound confirmed a patent right internal jugular vein. Ultrasound image was saved for documentation. The right chest and neck were cleaned with a skin antiseptic and a sterile drape was placed. Maximal barrier sterile technique was utilized including caps, mask, sterile  gowns, sterile gloves, sterile drape, hand hygiene and skin antiseptic. The right neck was anesthetized with 1% lidocaine. Small incision was made in the right neck with a blade. Micropuncture set was placed in the right internal jugular vein with ultrasound guidance. The micropuncture wire was used for measurement purposes. The right chest was anesthetized with  1% lidocaine with epinephrine. #15 blade was used to make an incision and a subcutaneous port pocket was formed. [place] was assembled. Subcutaneous tunnel was formed with a stiff tunneling device. The port catheter was brought through the subcutaneous tunnel. The port was placed in the subcutaneous pocket. The micropuncture set was exchanged for a peel-away sheath. The catheter was placed through the peel-away sheath and the tip was positioned at the superior cavoatrial junction. Catheter placement was confirmed with fluoroscopy. The port was accessed and flushed with heparinized saline. The port pocket was closed using two layers of absorbable sutures and Dermabond. The vein skin site was closed using a single layer of absorbable suture and Dermabond. Sterile dressings were applied. Patient tolerated the procedure well without an immediate complication. Ultrasound and fluoroscopic images were taken and saved for this procedure. IMPRESSION: Placement of a subcutaneous port device. Catheter tip at the superior cavoatrial junction. Electronically Signed   By: Markus Daft M.D.   On: 09/28/2020 17:07    ED COURSE and MDM  Nursing notes, initial and subsequent vitals signs, including pulse oximetry, reviewed and interpreted by myself.  Vitals:   09/30/20 0103 09/30/20 0130 09/30/20 0335 09/30/20 0444  BP: 116/90 119/74 133/82 (!) 84/69  Pulse: 75 (!) 57 87 70  Resp: 20 20 18 18   Temp:      TempSrc:      SpO2: 97% (!) 89% 100% 100%   Medications  sodium chloride (PF) 0.9 % injection (has no administration in time range)  iohexol  (OMNIPAQUE) 300 MG/ML solution 100 mL (100 mLs Intravenous Contrast Given 09/30/20 0341)   5:10 AM Advised of CT findings.  He is not having pain at the present time so I do not believe his fatty hernia is acutely infarcting.  He may be having intermittent ischemia which would correlate with his intermittent pain.  I do not believe he would be a good surgical candidate currently as he is undergoing chemotherapy but we will refer to [facility] surgery for evaluation.   PROCEDURES  Procedures   ED DIAGNOSES     ICD-10-CM   1. Left inguinal hernia  K40.90          Ercie Eliasen, Jenny Reichmann, MD 09/30/20 [phone]

## 2020-10-01 ENCOUNTER — Encounter: Payer: Self-pay | Admitting: Pharmacist

## 2020-10-01 ENCOUNTER — Ambulatory Visit (INDEPENDENT_AMBULATORY_CARE_PROVIDER_SITE_OTHER): Payer: Medicare HMO | Admitting: Pharmacist

## 2020-10-01 DIAGNOSIS — Z952 Presence of prosthetic heart valve: Secondary | ICD-10-CM

## 2020-10-01 DIAGNOSIS — Z79899 Other long term (current) drug therapy: Secondary | ICD-10-CM

## 2020-10-01 LAB — POCT INR: INR: 1.8 — AB (ref 2.0–3.0)

## 2020-10-01 NOTE — Progress Notes (Signed)
This encounter was created in error - please disregard.

## 2020-10-06 ENCOUNTER — Ambulatory Visit (INDEPENDENT_AMBULATORY_CARE_PROVIDER_SITE_OTHER): Payer: Medicare HMO | Admitting: Cardiovascular Disease

## 2020-10-06 ENCOUNTER — Encounter: Payer: Self-pay | Admitting: *Deleted

## 2020-10-06 DIAGNOSIS — Z952 Presence of prosthetic heart valve: Secondary | ICD-10-CM | POA: Diagnosis not present

## 2020-10-06 DIAGNOSIS — Z7901 Long term (current) use of anticoagulants: Secondary | ICD-10-CM | POA: Diagnosis not present

## 2020-10-06 DIAGNOSIS — Z79899 Other long term (current) drug therapy: Secondary | ICD-10-CM

## 2020-10-06 LAB — POCT INR: INR: 1.7 — AB (ref 2.0–3.0)

## 2020-10-07 ENCOUNTER — Other Ambulatory Visit: Payer: Self-pay | Admitting: Physician Assistant

## 2020-10-07 ENCOUNTER — Other Ambulatory Visit: Payer: Self-pay

## 2020-10-07 ENCOUNTER — Ambulatory Visit (INDEPENDENT_AMBULATORY_CARE_PROVIDER_SITE_OTHER): Payer: Medicare HMO | Admitting: Pharmacist Clinician (PhC)/ Clinical Pharmacy Specialist

## 2020-10-07 ENCOUNTER — Inpatient Hospital Stay: Payer: Medicare HMO

## 2020-10-07 ENCOUNTER — Other Ambulatory Visit: Payer: Medicare HMO

## 2020-10-07 DIAGNOSIS — C3490 Malignant neoplasm of unspecified part of unspecified bronchus or lung: Secondary | ICD-10-CM

## 2020-10-07 DIAGNOSIS — Z452 Encounter for adjustment and management of vascular access device: Secondary | ICD-10-CM | POA: Diagnosis not present

## 2020-10-07 DIAGNOSIS — D6481 Anemia due to antineoplastic chemotherapy: Secondary | ICD-10-CM | POA: Diagnosis not present

## 2020-10-07 DIAGNOSIS — R3121 Asymptomatic microscopic hematuria: Secondary | ICD-10-CM | POA: Diagnosis not present

## 2020-10-07 DIAGNOSIS — Z5111 Encounter for antineoplastic chemotherapy: Secondary | ICD-10-CM | POA: Diagnosis not present

## 2020-10-07 DIAGNOSIS — Z5112 Encounter for antineoplastic immunotherapy: Secondary | ICD-10-CM | POA: Diagnosis not present

## 2020-10-07 DIAGNOSIS — Z79899 Other long term (current) drug therapy: Secondary | ICD-10-CM | POA: Diagnosis not present

## 2020-10-07 DIAGNOSIS — T451X5A Adverse effect of antineoplastic and immunosuppressive drugs, initial encounter: Secondary | ICD-10-CM | POA: Diagnosis not present

## 2020-10-07 DIAGNOSIS — C3411 Malignant neoplasm of upper lobe, right bronchus or lung: Secondary | ICD-10-CM | POA: Diagnosis not present

## 2020-10-07 DIAGNOSIS — C3491 Malignant neoplasm of unspecified part of right bronchus or lung: Secondary | ICD-10-CM

## 2020-10-07 DIAGNOSIS — Z952 Presence of prosthetic heart valve: Secondary | ICD-10-CM

## 2020-10-07 DIAGNOSIS — N281 Cyst of kidney, acquired: Secondary | ICD-10-CM | POA: Diagnosis not present

## 2020-10-07 LAB — CMP (CANCER CENTER ONLY)
ALT: 28 U/L (ref 0–44)
AST: 31 U/L (ref 15–41)
Albumin: 3.1 g/dL — ABNORMAL LOW (ref 3.5–5.0)
Alkaline Phosphatase: 104 U/L (ref 38–126)
Anion gap: 7 (ref 5–15)
BUN: 15 mg/dL (ref 8–23)
CO2: 24 mmol/L (ref 22–32)
Calcium: 8.4 mg/dL — ABNORMAL LOW (ref 8.9–10.3)
Chloride: 106 mmol/L (ref 98–111)
Creatinine: 0.76 mg/dL (ref 0.61–1.24)
GFR, Estimated: >60 mL/min (ref >60)
Glucose, Bld: 86 mg/dL (ref 70–99)
Potassium: 4.3 mmol/L (ref 3.5–5.1)
Sodium: 137 mmol/L (ref 135–145)
Total Bilirubin: 0.6 mg/dL (ref 0.3–1.2)
Total Protein: 6.5 g/dL (ref 6.5–8.1)

## 2020-10-07 LAB — CBC WITH DIFFERENTIAL (CANCER CENTER ONLY)
Abs Immature Granulocytes: 0.01 10*3/uL (ref 0.00–0.07)
Basophils Absolute: 0 10*3/uL (ref 0.0–0.1)
Basophils Relative: 0 %
Eosinophils Absolute: 0 10*3/uL (ref 0.0–0.5)
Eosinophils Relative: 0 %
HCT: 27.1 % — ABNORMAL LOW (ref 39.0–52.0)
Hemoglobin: 9 g/dL — ABNORMAL LOW (ref 13.0–17.0)
Immature Granulocytes: 0 %
Lymphocytes Relative: 26 %
Lymphs Abs: 0.7 10*3/uL (ref 0.7–4.0)
MCH: 29.6 pg (ref 26.0–34.0)
MCHC: 33.2 g/dL (ref 30.0–36.0)
MCV: 89.1 fL (ref 80.0–100.0)
Monocytes Absolute: 0.3 10*3/uL (ref 0.1–1.0)
Monocytes Relative: 13 %
Neutro Abs: 1.5 10*3/uL — ABNORMAL LOW (ref 1.7–7.7)
Neutrophils Relative %: 61 %
Platelet Count: 58 10*3/uL — ABNORMAL LOW (ref 150–400)
RBC: 3.04 MIL/uL — ABNORMAL LOW (ref 4.22–5.81)
RDW: 16.4 % — ABNORMAL HIGH (ref 11.5–15.5)
WBC Count: 2.5 10*3/uL — ABNORMAL LOW (ref 4.0–10.5)
nRBC: 0 % (ref 0.0–0.2)

## 2020-10-07 LAB — SAMPLE TO BLOOD BANK

## 2020-10-07 LAB — POCT INR: INR: 1.9 — AB (ref 2.0–3.0)

## 2020-10-07 LAB — TSH: TSH: 1.324 u[IU]/mL (ref 0.320–4.118)

## 2020-10-07 MED ORDER — HEPARIN SOD (PORK) LOCK FLUSH 100 UNIT/ML IV SOLN
500.0000 [IU] | Freq: Once | INTRAVENOUS | Status: AC | PRN
  Administered 2020-10-07: 500 [IU]
  Filled 2020-10-07: qty 5

## 2020-10-07 MED ORDER — SODIUM CHLORIDE 0.9% FLUSH
10.0000 mL | INTRAVENOUS | Status: DC | PRN
Start: 2020-10-07 — End: 2020-10-07
  Administered 2020-10-07: 10 mL
  Filled 2020-10-07: qty 10

## 2020-10-07 NOTE — Progress Notes (Signed)
Made wife aware of how to place cream for next appt. Also, made them aware to remove band aid in 2 hrs and to not mess with the glue as it begins to peel off.

## 2020-10-08 DIAGNOSIS — H903 Sensorineural hearing loss, bilateral: Secondary | ICD-10-CM | POA: Diagnosis not present

## 2020-10-08 DIAGNOSIS — H6121 Impacted cerumen, right ear: Secondary | ICD-10-CM | POA: Diagnosis not present

## 2020-10-13 ENCOUNTER — Ambulatory Visit (INDEPENDENT_AMBULATORY_CARE_PROVIDER_SITE_OTHER): Payer: Medicare HMO | Admitting: Internal Medicine

## 2020-10-13 DIAGNOSIS — Z79899 Other long term (current) drug therapy: Secondary | ICD-10-CM

## 2020-10-13 DIAGNOSIS — Z952 Presence of prosthetic heart valve: Secondary | ICD-10-CM

## 2020-10-13 LAB — POCT INR: INR: 3.2 — AB (ref 2.0–3.0)

## 2020-10-13 NOTE — Progress Notes (Signed)
[facility] OFFICE PROGRESS NOTE  [name], MD [address] [place] [address]  DIAGNOSIS: Recurrent non-small cell lung cancer, adenocarcinoma initially diagnosed as stage IIIb (T3, N2, M0) non-small cell lung cancer, adenocarcinoma presented with right upper lobe lung mass in addition to right hilar and mediastinal lymphadenopathy. Diagnosed in December 08, 2019. In April 2022, he had signs of disease recurrence with extensive pleural involvement, likely extending into extrapleural fat, in the RIGHT chest compatible with disease recurrence and associated with nodal disease in the mediastinum. 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 Equivocal uptake in the LEFT adrenal gland.    Molecular Studies: Negative for actionable mutations   PDL1: 1-49%  PRIOR THERAPY:  1) Right upper lobectomy with lymph node sampling under the care of Dr. Kipp Brood on December 08, 2019. 2) Adjuvant systemic chemotherapy with Cisplatin 75 mg/m2 and Alimta 500 mg/m2 IV every 3 weeks. Last dose on 04/13/20.  Status post 4 cycles.  CURRENT THERAPY: Palliative systemic chemotherapy with carboplatin for an AUC of 5, Alimta, 500 mg/m2, and Keytruda 200 mg/m2. First dose expected on 09/01/20. Status post 2 cycles.   INTERVAL HISTORY: Marcus [name] 75 y.o. male returns to the clinic today for a follow-up visit accompanied by his wife. The patient is feeling well today without any concerning complaints except for new bilateral foot pain.  The patient has been tolerating his systemic chemotherapy well and he has noticed less right-sided chest pain since starting chemotherapy.  The patient states the bottom's of his feet have sharp pains with activity including walking or any type of pressure on the feet. This started after his last office visit and has continued throughout. He states this has never happened before, and he denies any sensation of numbness or  tingling. He has has some mild intermittent swelling in his feet and ankles although this he has experienced in the past. He currently does not wear compression socks and has been elevating his feet. The swelling improves with elevation but occurs when having the feet down. He has not tried compression stockings yet. He has not had any injuries or falls recently. His sensation is intact. He has not found anything that makes it better aside from staying off of his feet. He cannot take NSAIDs due to his other health conditions. The patient denies any recent fever, chills, night sweats, or weight loss. He states his appetite has been much better recently. His right-sided chest pain has continued to improve.  The patient denies significant cough and has had no change. He has had no change with shortness of breath on exertion.  He denies hemoptysis. He denies any nausea, vomiting, diarrhea, or constipation.  He denies any headache or visual changes.  He denies any rashes or skin changes.  The patient is here today for evaluation before starting cycle #3  MEDICAL HISTORY: Past Medical History:  Diagnosis Date   Atrial fibrillation ([place])    Dyspnea    with exertion    Dysrhythmia    Hypertension    Obstructive sleep apnea 10/25/2007   cpap   S/P AVR    2D ECHO, 04/25/2011 - EF >55%, Right ventricle-mild-moderately dilated   Swelling of limb    LEA VENOUS, 08/22/2009 - no evidence of deep vein or superficial thrombosis; partially rupturing Baker's Cyst    ALLERGIES:  is allergic to amlodipine, sulfa antibiotics, tetanus toxoids, crestor [rosuvastatin], and penicillins.  MEDICATIONS:  Current Outpatient  Medications  Medication Sig Dispense Refill   ALPRAZolam (XANAX) 0.25 MG tablet Take 1 tablet (0.25 mg total) by mouth every 8 (eight) hours as needed for up to 20 doses for anxiety. 20 tablet 0   atorvastatin (LIPITOR) 80 MG tablet TAKE 1 TABLET BY MOUTH EVERY DAY (Patient taking differently: Take 80 mg  by mouth daily.) 90 tablet 2   cetirizine (ZYRTEC) 10 MG tablet Take 10 mg by mouth daily.     enoxaparin (LOVENOX) 120 MG/0.8ML injection Inject 0.8 mLs (120 mg total) into the skin 1 tonight and 2 tomorrow AM/PM 2.4 mL 0   folic acid (FOLVITE) 1 MG tablet Take 1 tablet (1 mg total) by mouth daily. 30 tablet 2   HYDROcodone-acetaminophen (NORCO/VICODIN) 5-325 MG tablet Take 1 tablet by mouth every 4 (four) hours as needed for moderate pain. 45 tablet 0   lidocaine-prilocaine (EMLA) cream Apply 1 application topically as needed. 30 g 2   Multiple Vitamins-Minerals (EYE SUPPORT PO) Take 1 tablet by mouth daily. preservision     prochlorperazine (COMPAZINE) 10 MG tablet Take 1 tablet (10 mg total) by mouth every 6 (six) hours as needed. 30 tablet 2   ramipril (ALTACE) 10 MG capsule TAKE 1 CAPSULE BY MOUTH EVERY DAY (Patient taking differently: Take 10 mg by mouth daily.) 90 capsule 3   warfarin (COUMADIN) 10 MG tablet TAKE 1 TO 1 AND 1/2 TABLETS DAILY AS DIRECTED BY COUMADIN CLINIC 135 tablet 1   No current facility-administered medications for this visit.   Facility-Administered Medications Ordered in Other Visits  Medication Dose Route Frequency Provider Last Rate Last Admin   CARBOplatin (PARAPLATIN) 480 mg in sodium chloride 0.9 % 250 mL chemo infusion  480 mg Intravenous Once Curt Bears, MD       dexamethasone (DECADRON) 10 mg in sodium chloride 0.9 % 50 mL IVPB  10 mg Intravenous Once Curt Bears, MD 204 mL/hr at 10/14/20 1146 10 mg at 10/14/20 1146   fosaprepitant (EMEND) 150 mg in sodium chloride 0.9 % 145 mL IVPB  150 mg Intravenous Once Curt Bears, MD 450 mL/hr at 10/14/20 1145 150 mg at 10/14/20 1145   heparin lock flush 100 unit/mL  500 Units Intracatheter Once PRN Curt Bears, MD       pembrolizumab [facility]) 200 mg in sodium chloride 0.9 % 50 mL chemo infusion  200 mg Intravenous Once Curt Bears, MD       PEMEtrexed (ALIMTA) 1,000 mg in sodium chloride 0.9  % 100 mL chemo infusion  500 mg/m2 (Treatment Plan Recorded) Intravenous Once Curt Bears, MD       sodium chloride flush (NS) 0.9 % injection 10 mL  10 mL Intracatheter PRN Curt Bears, MD        SURGICAL HISTORY:  Past Surgical History:  Procedure Laterality Date   CARDIAC CATHETERIZATION Bilateral 05/10/2007   Significant 1-vessel disease, severely dilated aortic root with moderate severe aortic insufficiency   CARDIAC SURGERY     CARDIOVERSION  08/02/2007   150 Joule biphasic shock with restoration of sinus rhythm. Heart rate 60.   cataract surgery      COLONOSCOPY WITH PROPOFOL N/A 12/15/2016   Procedure: COLONOSCOPY WITH PROPOFOL;  Surgeon: Carol Ada, MD;  Location: [facility];  Service: Endoscopy;  Laterality: N/A;   EYE SURGERY     INTERCOSTAL NERVE BLOCK Right 12/08/2019   Procedure: INTERCOSTAL NERVE BLOCK;  Surgeon: [name], MD;  Location: [facility];  Service: Thoracic;  Laterality: Right;  IR IMAGING GUIDED PORT INSERTION  09/28/2020   IR THORACENTESIS ASP PLEURAL SPACE W/IMG GUIDE  08/05/2020   NM MYOVIEW LTD  04/08/2007   No evidence of inducible myocardial ischemia   NODE DISSECTION Right 12/08/2019   Procedure: NODE DISSECTION;  Surgeon: [name], MD;  Location: [facility];  Service: Thoracic;  Laterality: Right;   THYROIDECTOMY, PARTIAL     torn meniscus in right knee surgery       REVIEW OF SYSTEMS:   Review of Systems  Constitutional: Negative for appetite change, chills, fatigue, fever and unexpected weight change.  HENT:   Negative for mouth sores, nosebleeds, sore throat and trouble swallowing.   Eyes: Negative for eye problems and icterus.  Respiratory: Positive cough and shortness of breath with exertion. Negative for hemoptysis and wheezing.   Cardiovascular: Positive mild bilateral ankle swelling. Negative for chest pain  Gastrointestinal: Negative for abdominal pain, constipation, diarrhea, nausea and vomiting.  Genitourinary:  Negative for bladder incontinence, difficulty urinating, dysuria, frequency and hematuria.   Musculoskeletal: Positive bilateral foot pain on plantar aspect of feet. Negative for back pain, gait problem, neck pain and neck stiffness.  Skin: Negative for itching and rash.  Neurological: Negative for dizziness, extremity weakness, gait problem, headaches, light-headedness and seizures.  Hematological: Negative for adenopathy. Does not bruise/bleed easily.  Psychiatric/Behavioral: Negative for confusion, depression and sleep disturbance. The patient is not nervous/anxious.     PHYSICAL EXAMINATION:  Blood pressure 125/72, pulse 65, temperature 97.9 F (36.6 C), temperature source Oral, resp. rate 19, height 5' 11"  (1.803 m), weight 164 lb 3.2 oz (74.5 kg), SpO2 100 %.  ECOG PERFORMANCE STATUS: 1  Physical Exam  Constitutional: Oriented to person, place, and time and well-developed, well-nourished, and in no distress.  HENT:  Head: Normocephalic and atraumatic.  Mouth/Throat: Oropharynx is clear and moist. No oropharyngeal exudate.  Eyes: Conjunctivae are normal. Right eye exhibits no discharge. Left eye exhibits no discharge. No scleral icterus.  Neck: Normal range of motion. Neck supple.  Cardiovascular: Normal rate, regular rhythm, normal heart sounds has mechanical valve, and intact distal pulses.   Pulmonary/Chest: Effort normal and breath sounds normal. No respiratory distress. No wheezes. No rales.  Abdominal: Soft. Bowel sounds are normal. Exhibits no distension and no mass. There is no tenderness.  Musculoskeletal: Normal range of motion. Exhibits bilateral non-pitting edema to the ankle.   Lymphadenopathy: No cervical adenopathy.  Neurological: Alert and oriented to person, place, and time. Exhibits normal muscle tone. Gait normal. Coordination normal. Sensation intact. Skin: Skin is warm and dry. No rash noted. Not diaphoretic. No erythema. No pallor.  Psychiatric: Mood, memory and  judgment normal.  Vitals reviewed.  LABORATORY DATA: Lab Results  Component Value Date   WBC 2.7 (L) 10/14/2020   HGB 8.4 (L) 10/14/2020   HCT 25.4 (L) 10/14/2020   MCV 89.4 10/14/2020   PLT 135 (L) 10/14/2020      Chemistry      Component Value Date/Time   NA 138 10/14/2020 0937   NA 139 10/28/2019 1057   K 4.0 10/14/2020 0937   CL 105 10/14/2020 0937   CO2 25 10/14/2020 0937   BUN 21 10/14/2020 0937   BUN 10 10/28/2019 1057   CREATININE 0.86 10/14/2020 0937      Component Value Date/Time   CALCIUM 8.2 (L) 10/14/2020 0937   ALKPHOS 103 10/14/2020 0937   AST 21 10/14/2020 0937   ALT 16 10/14/2020 0937   BILITOT 0.5 10/14/2020 0865  RADIOGRAPHIC STUDIES:  CT PELVIS W CONTRAST  Result Date: 09/30/2020 CLINICAL DATA:  Abdominal pain with hernia suspected. Evaluate for left inguinal hernia. EXAM: CT PELVIS WITH CONTRAST TECHNIQUE: Multidetector CT imaging of the pelvis was performed using the standard protocol following the bolus administration of intravenous contrast. CONTRAST:  146m OMNIPAQUE IOHEXOL 300 MG/ML  SOLN COMPARISON:  Abdominal CT 04/07/2006.  Head CT 08/20/2020 FINDINGS: Urinary Tract:  Collapsed bladder.  No explanation for symptoms Bowel: Left colonic diverticulosis. No visible obstruction or inflammation Vascular/Lymphatic: Atherosclerosis with aortic and iliac tortuosity and chronic aneurysm at the left common iliac bifurcation which measures 2.7 cm in diameter on coronal reformats. The aneurysm is primarily fusiform but does have a proximally projected saccular component which is smaller. Reproductive:  Negative Other: Fatty left inguinal hernia. The herniated fat has a stranded appearance with discrete rim when compared to prior PET CT or fat herniation was very subtle. There is fatty expansion of the right inguinal canal without discrete hernia. Musculoskeletal: Lumbar spine degeneration. IMPRESSION: Fatty left inguinal hernia. The herniated fat appears  congested since prior, suggesting fat ischemia/infarct. Electronically Signed   By: JMonte FantasiaM.D.   On: 09/30/2020 04:17   IR IMAGING GUIDED PORT INSERTION  Result Date: 09/28/2020 INDICATION: 75year old with non-small cell lung cancer. Patient has poor venous access and request for a Port-A-Cath. EXAM: FLUOROSCOPIC AND ULTRASOUND GUIDED PLACEMENT OF A SUBCUTANEOUS PORT COMPARISON:  None. MEDICATIONS: Moderate sedation ANESTHESIA/SEDATION: Versed 2.0 mg IV; Fentanyl 100 mcg IV; Moderate Sedation Time:  30 minutes The patient was continuously monitored during the procedure by the interventional radiology nurse under my direct supervision. FLUOROSCOPY TIME:  18 seconds, 2 mGy COMPLICATIONS: None immediate. PROCEDURE: The procedure, risks, benefits, and alternatives were explained to the patient. Questions regarding the procedure were encouraged and answered. The patient understands and consents to the procedure. Patient was placed supine on the interventional table. Ultrasound confirmed a patent right internal jugular vein. Ultrasound image was saved for documentation. The right chest and neck were cleaned with a skin antiseptic and a sterile drape was placed. Maximal barrier sterile technique was utilized including caps, mask, sterile gowns, sterile gloves, sterile drape, hand hygiene and skin antiseptic. The right neck was anesthetized with 1% lidocaine. Small incision was made in the right neck with a blade. Micropuncture set was placed in the right internal jugular vein with ultrasound guidance. The micropuncture wire was used for measurement purposes. The right chest was anesthetized with 1% lidocaine with epinephrine. #15 blade was used to make an incision and a subcutaneous port pocket was formed. [place]. Subcutaneous tunnel was formed with a stiff tunneling device. The port catheter was brought through the subcutaneous tunnel. The port was placed in the subcutaneous  pocket. The micropuncture set was exchanged for a peel-away sheath. The catheter was placed through the peel-away sheath and the tip was positioned at the superior cavoatrial junction. Catheter placement was confirmed with fluoroscopy. The port was accessed and flushed with heparinized saline. The port pocket was closed using two layers of absorbable sutures and Dermabond. The vein skin site was closed using a single layer of absorbable suture and Dermabond. Sterile dressings were applied. Patient tolerated the procedure well without an immediate complication. Ultrasound and fluoroscopic images were taken and saved for this procedure. IMPRESSION: Placement of a subcutaneous port device. Catheter tip at the superior cavoatrial junction. Electronically Signed   By: AMarkus DaftM.D.   On: 09/28/2020 17:07  ASSESSMENT/PLAN:  This is a very pleasant 75 year old Caucasian male with recurrent lung cancer, initially diagnosed with stage IIIb non-small cell lung cancer, adenocarcinoma.  The patient presented with a right upper lobe lung mass in addition to right hilar and mediastinal lymphadenopathy.  He was diagnosed in August 2021.  He does not have any actionable mutations. His PD-L1 expression is estimated to be in the range of 1 to 49%. He had evidence of disease recurrence in April 2022 with extensive pleural involvement, likely extending into extrapleural fat, in the RIGHT chest compatible with disease recurrence and associated with nodal disease in the mediastinum. 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 Equivocal uptake in the LEFT adrenal gland.  The patient declined enrollment in the alliance clinical trial for treatment with systemic chemotherapy plus or minus immunotherapy.   He underwent adjuvant systemic chemotherapy with cisplatin 75 mg/M2 and Alimta 500 mg/M2 every 3 weeks.  Status post 4 cycles.  He tolerated his treatment fairly well with no concerning adverse  effects.  The patient has been on observation and feeling fine until he recently presented to the emergency room for the chief complaint of pleuritic right-sided chest pain.  CT angiogram showed new moderate loculated right pleural effusion with mild pleural soft tissue thickening overlying the apical portions of the right lung.    He had a diagnostic thoracentesis which showed rare atypical cells concerning for malignancy.  He recently had a PET scan performed. Dr. Julien Nordmann personally and independently reviewed the scan and discussed the results with the patient. Unfortunately, the patient's scan showed signs of recurrent and metastatic disease with extensive pleural involvement.    Therefore, he started on palliative systemic chemotherapy with carboplatin for an AUC of 5, Alimta 500 mg/m, and Keytruda 200 mg IV every 3 weeks.  He is status post 2 cycles and has been tolerating it well.  The patient was seen with Dr. Julien Nordmann.  Labs were reviewed.  Recommend that he proceed with cycle #3 today scheduled.  I will arrange for restaging CT scan of the chest prior to starting his next cycle of treatment.  We will see him back for follow-up visit in 3 weeks for evaluation to review his scan results before starting cycle #4.  The patient's bilateral foot pain could have multiple possible etiologies including plantar fascities as the pain is reproducible with activity and pressure. He denies any numbness/tingling and has good sensation to both feet. Unfortunately, the patient is not advised to take NSAIDs with his current treatment, comorbid conditions, and adverse effects on his kidney function. We recommend he use compression socks to assist with swelling as well as practice regular exercises such as rolling a tennis ball along the bottom of his feet to help relieve any possible inflammation and stretching. Also encourage him to elevate his legs. We recommened he follow up with his PCP if needed for a  further evaluation of his symptoms and possible orthopedic referral if the symptoms persist. Low suspicioun for a DVT given that it is bilateral swelling, no calf pain/redness, and he is on warfarin. Does not appear consistent with chemotherapy induced (prior use of cisplatin) peripheral neuropathy either given it is in the plantar surface and not the toes. He denies numbness/tingling/decreased sensation. He also does not have any neuropathy in his hands.   He was also advised to increase his protein intake since his albumin is low which can increase swelling. He presently does not drink boost/ensure. Advised to pick some up  and begin taking it.   His hemoglobin is 8.4. We will arrange for a blood transfusion if his Hbg <8. I will add a sample to blood bank for the next two weeks .  The patient was advised to call immediately if he has any concerning symptoms in the interval. The patient voices understanding of current disease status and treatment options and is in agreement with the current care plan. All questions were answered. The patient knows to call the clinic with any problems, questions or concerns. We can certainly see the patient much sooner if necessary    Orders Placed This Encounter  Procedures   CT Chest W Contrast    Standing Status:   Future    Standing Expiration Date:   10/14/2021    Order Specific Question:   If indicated for the ordered procedure, I authorize the administration of contrast media per Radiology protocol    Answer:   Yes    Order Specific Question:   Preferred imaging location?    Answer:   [facility]   Sample to Blood Bank    Standing Status:   Standing    Number of Occurrences:   2    Standing Expiration Date:   10/14/2021     Tobe Sos Ashanna Heinsohn, PA-C 10/14/20  ADDENDUM: Hematology/Oncology Attending: I had a face-to-face encounter with the patient today.  I reviewed his record, lab and recommended his care plan.  This is a very  pleasant 75 years old white male with recurrent non-small cell lung cancer that was initially diagnosed as a stage IIIb, adenocarcinoma presented with right upper lobe lung mass in addition to right hilar and mediastinal lymphadenopathy diagnosed in August 2021.  The patient has no actionable mutation and PD-L1 expression in the range of 1-49%.  He is status post right upper lobectomy with lymph node dissection followed by adjuvant systemic chemotherapy with cisplatin and Alimta.  The patient had evidence for disease recurrence with pleural-based metastasis in April 2022. The patient is currently undergoing systemic chemotherapy with carboplatin, Alimta and Keytruda every 3 weeks status post 2 cycles.  The patient has been tolerating his treatment well with no concerning adverse effect except for mild fatigue.  He also has some pain in the sole of his feet suspicious to be secondary to fasciitis. I recommended for the patient to proceed with cycle #3 today as planned. We will see him back for follow-up visit in 3 weeks for evaluation with repeat CT scan of the chest for restaging of his disease. The patient was advised to call immediately if he has any other concerning symptoms in the interval.  Disclaimer: This note was dictated with voice recognition software. Similar sounding words can inadvertently be transcribed and may be missed upon review. Eilleen Kempf, MD 10/14/20

## 2020-10-14 ENCOUNTER — Other Ambulatory Visit: Payer: Self-pay

## 2020-10-14 ENCOUNTER — Inpatient Hospital Stay: Payer: Medicare HMO | Attending: Internal Medicine | Admitting: Physician Assistant

## 2020-10-14 ENCOUNTER — Encounter: Payer: Self-pay | Admitting: Internal Medicine

## 2020-10-14 ENCOUNTER — Inpatient Hospital Stay: Payer: Medicare HMO

## 2020-10-14 ENCOUNTER — Encounter: Payer: Self-pay | Admitting: Physician Assistant

## 2020-10-14 ENCOUNTER — Other Ambulatory Visit: Payer: Medicare HMO

## 2020-10-14 VITALS — BP 125/72 | HR 65 | Temp 97.9°F | Resp 19 | Ht 71.0 in | Wt 164.2 lb

## 2020-10-14 DIAGNOSIS — C3490 Malignant neoplasm of unspecified part of unspecified bronchus or lung: Secondary | ICD-10-CM

## 2020-10-14 DIAGNOSIS — Z5112 Encounter for antineoplastic immunotherapy: Secondary | ICD-10-CM | POA: Insufficient documentation

## 2020-10-14 DIAGNOSIS — C3491 Malignant neoplasm of unspecified part of right bronchus or lung: Secondary | ICD-10-CM

## 2020-10-14 DIAGNOSIS — M79671 Pain in right foot: Secondary | ICD-10-CM | POA: Diagnosis not present

## 2020-10-14 DIAGNOSIS — M79672 Pain in left foot: Secondary | ICD-10-CM

## 2020-10-14 DIAGNOSIS — Z79899 Other long term (current) drug therapy: Secondary | ICD-10-CM | POA: Insufficient documentation

## 2020-10-14 DIAGNOSIS — Z95828 Presence of other vascular implants and grafts: Secondary | ICD-10-CM

## 2020-10-14 DIAGNOSIS — C3411 Malignant neoplasm of upper lobe, right bronchus or lung: Secondary | ICD-10-CM | POA: Insufficient documentation

## 2020-10-14 DIAGNOSIS — Z5111 Encounter for antineoplastic chemotherapy: Secondary | ICD-10-CM | POA: Insufficient documentation

## 2020-10-14 DIAGNOSIS — D649 Anemia, unspecified: Secondary | ICD-10-CM | POA: Insufficient documentation

## 2020-10-14 LAB — CBC WITH DIFFERENTIAL (CANCER CENTER ONLY)
Abs Immature Granulocytes: 0.02 10*3/uL (ref 0.00–0.07)
Basophils Absolute: 0 10*3/uL (ref 0.0–0.1)
Basophils Relative: 0 %
Eosinophils Absolute: 0 10*3/uL (ref 0.0–0.5)
Eosinophils Relative: 0 %
HCT: 25.4 % — ABNORMAL LOW (ref 39.0–52.0)
Hemoglobin: 8.4 g/dL — ABNORMAL LOW (ref 13.0–17.0)
Immature Granulocytes: 1 %
Lymphocytes Relative: 17 %
Lymphs Abs: 0.4 10*3/uL — ABNORMAL LOW (ref 0.7–4.0)
MCH: 29.6 pg (ref 26.0–34.0)
MCHC: 33.1 g/dL (ref 30.0–36.0)
MCV: 89.4 fL (ref 80.0–100.0)
Monocytes Absolute: 0.3 10*3/uL (ref 0.1–1.0)
Monocytes Relative: 10 %
Neutro Abs: 1.9 10*3/uL (ref 1.7–7.7)
Neutrophils Relative %: 72 %
Platelet Count: 135 10*3/uL — ABNORMAL LOW (ref 150–400)
RBC: 2.84 MIL/uL — ABNORMAL LOW (ref 4.22–5.81)
RDW: 17.5 % — ABNORMAL HIGH (ref 11.5–15.5)
WBC Count: 2.7 10*3/uL — ABNORMAL LOW (ref 4.0–10.5)
nRBC: 0 % (ref 0.0–0.2)

## 2020-10-14 LAB — CMP (CANCER CENTER ONLY)
ALT: 16 U/L (ref 0–44)
AST: 21 U/L (ref 15–41)
Albumin: 3 g/dL — ABNORMAL LOW (ref 3.5–5.0)
Alkaline Phosphatase: 103 U/L (ref 38–126)
Anion gap: 8 (ref 5–15)
BUN: 21 mg/dL (ref 8–23)
CO2: 25 mmol/L (ref 22–32)
Calcium: 8.2 mg/dL — ABNORMAL LOW (ref 8.9–10.3)
Chloride: 105 mmol/L (ref 98–111)
Creatinine: 0.86 mg/dL (ref 0.61–1.24)
GFR, Estimated: >60 mL/min (ref >60)
Glucose, Bld: 159 mg/dL — ABNORMAL HIGH (ref 70–99)
Potassium: 4 mmol/L (ref 3.5–5.1)
Sodium: 138 mmol/L (ref 135–145)
Total Bilirubin: 0.5 mg/dL (ref 0.3–1.2)
Total Protein: 6.5 g/dL (ref 6.5–8.1)

## 2020-10-14 LAB — SAMPLE TO BLOOD BANK

## 2020-10-14 MED ORDER — SODIUM CHLORIDE 0.9 % IV SOLN
150.0000 mg | Freq: Once | INTRAVENOUS | Status: AC
  Administered 2020-10-14: 150 mg via INTRAVENOUS
  Filled 2020-10-14: qty 150

## 2020-10-14 MED ORDER — SODIUM CHLORIDE 0.9% FLUSH
10.0000 mL | Freq: Once | INTRAVENOUS | Status: AC
  Administered 2020-10-14: 10 mL
  Filled 2020-10-14: qty 10

## 2020-10-14 MED ORDER — SODIUM CHLORIDE 0.9 % IV SOLN
10.0000 mg | Freq: Once | INTRAVENOUS | Status: AC
  Administered 2020-10-14: 10 mg via INTRAVENOUS
  Filled 2020-10-14: qty 10

## 2020-10-14 MED ORDER — PALONOSETRON HCL INJECTION 0.25 MG/5ML
0.2500 mg | Freq: Once | INTRAVENOUS | Status: AC
  Administered 2020-10-14: 0.25 mg via INTRAVENOUS

## 2020-10-14 MED ORDER — SODIUM CHLORIDE 0.9 % IV SOLN
Freq: Once | INTRAVENOUS | Status: AC
  Filled 2020-10-14: qty 250

## 2020-10-14 MED ORDER — SODIUM CHLORIDE 0.9% FLUSH
10.0000 mL | INTRAVENOUS | Status: DC | PRN
  Administered 2020-10-14: 10 mL
  Filled 2020-10-14: qty 10

## 2020-10-14 MED ORDER — SODIUM CHLORIDE 0.9 % IV SOLN
476.5000 mg | Freq: Once | INTRAVENOUS | Status: AC
  Administered 2020-10-14: 480 mg via INTRAVENOUS
  Filled 2020-10-14: qty 48

## 2020-10-14 MED ORDER — SODIUM CHLORIDE 0.9 % IV SOLN
500.0000 mg/m2 | Freq: Once | INTRAVENOUS | Status: AC
  Administered 2020-10-14: 1000 mg via INTRAVENOUS
  Filled 2020-10-14: qty 40

## 2020-10-14 MED ORDER — PALONOSETRON HCL INJECTION 0.25 MG/5ML
INTRAVENOUS | Status: AC
  Filled 2020-10-14: qty 5

## 2020-10-14 MED ORDER — HEPARIN SOD (PORK) LOCK FLUSH 100 UNIT/ML IV SOLN
500.0000 [IU] | Freq: Once | INTRAVENOUS | Status: AC | PRN
Start: 2020-10-14 — End: 2020-10-14
  Administered 2020-10-14: 500 [IU]
  Filled 2020-10-14: qty 5

## 2020-10-14 MED ORDER — SODIUM CHLORIDE 0.9 % IV SOLN
200.0000 mg | Freq: Once | INTRAVENOUS | Status: AC
  Administered 2020-10-14: 200 mg via INTRAVENOUS
  Filled 2020-10-14: qty 8

## 2020-10-14 NOTE — Patient Instructions (Signed)
[facility]  Discharge Instructions: Thank you for choosing [facility] to provide your oncology and hematology care.   If you have a lab appointment with the [facility], please go directly to the [facility] and check in at the registration area.   Wear comfortable clothing and clothing appropriate for easy access to any Portacath or PICC line.   We strive to give you quality time with your provider. You may need to reschedule your appointment if you arrive late (15 or more minutes).  Arriving late affects you and other patients whose appointments are after yours.  Also, if you miss three or more appointments without notifying the office, you may be dismissed from the clinic at the provider's discretion.      For prescription refill requests, have your pharmacy contact our office and allow 72 hours for refills to be completed.    Today you received the following chemotherapy and/or immunotherapy agents Keytruda, Alimta, Carboplatin   To help prevent nausea and vomiting after your treatment, we encourage you to take your nausea medication as directed.  BELOW ARE SYMPTOMS THAT SHOULD BE REPORTED IMMEDIATELY: *FEVER GREATER THAN 100.4 F (38 C) OR HIGHER *CHILLS OR SWEATING *NAUSEA AND VOMITING THAT IS NOT CONTROLLED WITH YOUR NAUSEA MEDICATION *UNUSUAL SHORTNESS OF BREATH *UNUSUAL BRUISING OR BLEEDING *URINARY PROBLEMS (pain or burning when urinating, or frequent urination) *BOWEL PROBLEMS (unusual diarrhea, constipation, pain near the anus) TENDERNESS IN MOUTH AND THROAT WITH OR WITHOUT PRESENCE OF ULCERS (sore throat, sores in mouth, or a toothache) UNUSUAL RASH, SWELLING OR PAIN  UNUSUAL VAGINAL DISCHARGE OR ITCHING   Items with * indicate a potential emergency and should be followed up as soon as possible or go to the Emergency Department if any problems should occur.  Please show the CHEMOTHERAPY ALERT CARD or IMMUNOTHERAPY ALERT CARD  at check-in to the Emergency Department and triage nurse.  Should you have questions after your visit or need to cancel or reschedule your appointment, please contact [facility]  Dept: [phone]  and follow the prompts.  Office hours are 8:00 a.m. to 4:30 p.m. Monday - Friday. Please note that voicemails left after 4:00 p.m. may not be returned until the following business day.  We are closed weekends and major holidays. You have access to a nurse at all times for urgent questions. Please call the main number to the clinic Dept: [phone] and follow the prompts.   For any non-urgent questions, you may also contact your provider using MyChart. We now offer e-Visits for anyone 2 and older to request care online for non-urgent symptoms. For details visit [email or web address].   Also download the MyChart app! Go to the app store, search "MyChart", open the app, select [facility], and log in with your MyChart username and password.  Due to Covid, a mask is required upon entering the hospital/clinic. If you do not have a mask, one will be given to you upon arrival. For doctor visits, patients may have 1 support person aged 42 or older with them. For treatment visits, patients cannot have anyone with them due to current Covid guidelines and our immunocompromised population.  Pembrolizumab [facility]) injection What is this medicine? PEMBROLIZUMAB (pem broe liz ue mab) is a monoclonal antibody. It is used to treat certain types of cancer. This medicine may be used for other purposes; ask your health care provider or pharmacist if you have questions. COMMON BRAND NAME(S): Keytruda What should  I tell my health care provider before I take this medicine? They need to know if you have any of these conditions: autoimmune diseases like Crohn's disease, ulcerative colitis, or lupus have had or planning to have an allogeneic stem cell transplant (uses someone else's stem  cells) history of organ transplant history of chest radiation nervous system problems like myasthenia gravis or Guillain-Barre syndrome an unusual or allergic reaction to pembrolizumab, other medicines, foods, dyes, or preservatives pregnant or trying to get pregnant breast-feeding How should I use this medicine? This medicine is for infusion into a vein. It is given by a health care professional in a hospital or clinic setting. A special MedGuide will be given to you before each treatment. Be sure to read this information carefully each time. Talk to your pediatrician regarding the use of this medicine in children. While this drug may be prescribed for children as young as 6 months for selected conditions, precautions do apply. Overdosage: If you think you have taken too much of this medicine contact a poison control center or emergency room at once. NOTE: This medicine is only for you. Do not share this medicine with others. What if I miss a dose? It is important not to miss your dose. Call your doctor or health care professional if you are unable to keep an appointment. What may interact with this medicine? Interactions have not been studied. This list may not describe all possible interactions. Give your health care provider a list of all the medicines, herbs, non-prescription drugs, or dietary supplements you use. Also tell them if you smoke, drink alcohol, or use illegal drugs. Some items may interact with your medicine. What should I watch for while using this medicine? Your condition will be monitored carefully while you are receiving this medicine. You may need blood work done while you are taking this medicine. Do not become pregnant while taking this medicine or for 4 months after stopping it. Women should inform their doctor if they wish to become pregnant or think they might be pregnant. There is a potential for serious side effects to an unborn child. Talk to your health care  professional or pharmacist for more information. Do not breast-feed an infant while taking this medicine or for 4 months after the last dose. What side effects may I notice from receiving this medicine? Side effects that you should report to your doctor or health care professional as soon as possible: allergic reactions like skin rash, itching or hives, swelling of the face, lips, or tongue bloody or black, tarry breathing problems changes in vision chest pain chills confusion constipation cough diarrhea dizziness or feeling faint or lightheaded fast or irregular heartbeat fever flushing joint pain low blood counts - this medicine may decrease the number of white blood cells, red blood cells and platelets. You may be at increased risk for infections and bleeding. muscle pain muscle weakness pain, tingling, numbness in the hands or feet persistent headache redness, blistering, peeling or loosening of the skin, including inside the mouth signs and symptoms of high blood sugar such as dizziness; dry mouth; dry skin; fruity breath; nausea; stomach pain; increased hunger or thirst; increased urination signs and symptoms of kidney injury like trouble passing urine or change in the amount of urine signs and symptoms of liver injury like dark urine, light-colored stools, loss of appetite, nausea, right upper belly pain, yellowing of the eyes or skin sweating swollen lymph nodes weight loss Side effects that usually do not require medical  attention (report to your doctor or health care professional if they continue or are bothersome): decreased appetite hair loss tiredness This list may not describe all possible side effects. Call your doctor for medical advice about side effects. You may report side effects to FDA at 1-800-FDA-1088. Where should I keep my medicine? This drug is given in a hospital or clinic and will not be stored at home. NOTE: This sheet is a summary. It may not cover  all possible information. If you have questions about this medicine, talk to your doctor, pharmacist, or health care provider.  2021 Elsevier/Gold Standard (2019-02-26 21:44:53)  Pemetrexed (Alimta) injection What is this medicine? PEMETREXED (PEM e TREX ed) is a chemotherapy drug used to treat lung cancers like non-small cell lung cancer and mesothelioma. It may also be used to treat other cancers. This medicine may be used for other purposes; ask your health care provider or pharmacist if you have questions. COMMON BRAND NAME(S): Alimta What should I tell my health care provider before I take this medicine? They need to know if you have any of these conditions: infection (especially a virus infection such as chickenpox, cold sores, or herpes) kidney disease low blood counts, like low white cell, platelet, or red cell counts lung or breathing disease, like asthma radiation therapy an unusual or allergic reaction to pemetrexed, other medicines, foods, dyes, or preservative pregnant or trying to get pregnant breast-feeding How should I use this medicine? This drug is given as an infusion into a vein. It is administered in a hospital or clinic by a specially trained health care professional. Talk to your pediatrician regarding the use of this medicine in children. Special care may be needed. Overdosage: If you think you have taken too much of this medicine contact a poison control center or emergency room at once. NOTE: This medicine is only for you. Do not share this medicine with others. What if I miss a dose? It is important not to miss your dose. Call your doctor or health care professional if you are unable to keep an appointment. What may interact with this medicine? This medicine may interact with the following medications: Ibuprofen This list may not describe all possible interactions. Give your health care provider a list of all the medicines, herbs, non-prescription drugs, or  dietary supplements you use. Also tell them if you smoke, drink alcohol, or use illegal drugs. Some items may interact with your medicine. What should I watch for while using this medicine? Visit your doctor for checks on your progress. This drug may make you feel generally unwell. This is not uncommon, as chemotherapy can affect healthy cells as well as cancer cells. Report any side effects. Continue your course of treatment even though you feel ill unless your doctor tells you to stop. In some cases, you may be given additional medicines to help with side effects. Follow all directions for their use. Call your doctor or health care professional for advice if you get a fever, chills or sore throat, or other symptoms of a cold or flu. Do not treat yourself. This drug decreases your body's ability to fight infections. Try to avoid being around people who are sick. This medicine may increase your risk to bruise or bleed. Call your doctor or health care professional if you notice any unusual bleeding. Be careful brushing and flossing your teeth or using a toothpick because you may get an infection or bleed more easily. If you have any dental work done, tell  your dentist you are receiving this medicine. Avoid taking products that contain aspirin, acetaminophen, ibuprofen, naproxen, or ketoprofen unless instructed by your doctor. These medicines may hide a fever. Call your doctor or health care professional if you get diarrhea or mouth sores. Do not treat yourself. To protect your kidneys, drink water or other fluids as directed while you are taking this medicine. Do not become pregnant while taking this medicine or for 6 months after stopping it. Women should inform their doctor if they wish to become pregnant or think they might be pregnant. Men should not father a child while taking this medicine and for 3 months after stopping it. This may interfere with the ability to father a child. You should talk to  your doctor or health care professional if you are concerned about your fertility. There is a potential for serious side effects to an unborn child. Talk to your health care professional or pharmacist for more information. Do not breast-feed an infant while taking this medicine or for 1 week after stopping it. What side effects may I notice from receiving this medicine? Side effects that you should report to your doctor or health care professional as soon as possible: allergic reactions like skin rash, itching or hives, swelling of the face, lips, or tongue breathing problems redness, blistering, peeling or loosening of the skin, including inside the mouth signs and symptoms of bleeding such as bloody or black, tarry stools; red or dark-brown urine; spitting up blood or brown material that looks like coffee grounds; red spots on the skin; unusual bruising or bleeding from the eye, gums, or nose signs and symptoms of infection like fever or chills; cough; sore throat; pain or trouble passing urine signs and symptoms of kidney injury like trouble passing urine or change in the amount of urine signs and symptoms of liver injury like dark yellow or brown urine; general ill feeling or flu-like symptoms; light-colored stools; loss of appetite; nausea; right upper belly pain; unusually weak or tired; yellowing of the eyes or skin Side effects that usually do not require medical attention (report to your doctor or health care professional if they continue or are bothersome): constipation mouth sores nausea, vomiting unusually weak or tired This list may not describe all possible side effects. Call your doctor for medical advice about side effects. You may report side effects to FDA at 1-800-FDA-1088. Where should I keep my medicine? This drug is given in a hospital or clinic and will not be stored at home. NOTE: This sheet is a summary. It may not cover all possible information. If you have questions about  this medicine, talk to your doctor, pharmacist, or health care provider.  2021 Elsevier/Gold Standard (2017-05-16 16:11:33)  Carboplatin injection What is this medicine? CARBOPLATIN (KAR boe pla tin) is a chemotherapy drug. It targets fast dividing cells, like cancer cells, and causes these cells to die. This medicine is used to treat ovarian cancer and many other cancers. This medicine may be used for other purposes; ask your health care provider or pharmacist if you have questions. COMMON BRAND NAME(S): Paraplatin What should I tell my health care provider before I take this medicine? They need to know if you have any of these conditions: blood disorders hearing problems kidney disease recent or ongoing radiation therapy an unusual or allergic reaction to carboplatin, cisplatin, other chemotherapy, other medicines, foods, dyes, or preservatives pregnant or trying to get pregnant breast-feeding How should I use this medicine? This drug is usually  given as an infusion into a vein. It is administered in a hospital or clinic by a specially trained health care professional. Talk to your pediatrician regarding the use of this medicine in children. Special care may be needed. Overdosage: If you think you have taken too much of this medicine contact a poison control center or emergency room at once. NOTE: This medicine is only for you. Do not share this medicine with others. What if I miss a dose? It is important not to miss a dose. Call your doctor or health care professional if you are unable to keep an appointment. What may interact with this medicine? medicines for seizures medicines to increase blood counts like filgrastim, pegfilgrastim, sargramostim some antibiotics like amikacin, gentamicin, neomycin, streptomycin, tobramycin vaccines Talk to your doctor or health care professional before taking any of these medicines: acetaminophen aspirin ibuprofen ketoprofen naproxen This  list may not describe all possible interactions. Give your health care provider a list of all the medicines, herbs, non-prescription drugs, or dietary supplements you use. Also tell them if you smoke, drink alcohol, or use illegal drugs. Some items may interact with your medicine. What should I watch for while using this medicine? Your condition will be monitored carefully while you are receiving this medicine. You will need important blood work done while you are taking this medicine. This drug may make you feel generally unwell. This is not uncommon, as chemotherapy can affect healthy cells as well as cancer cells. Report any side effects. Continue your course of treatment even though you feel ill unless your doctor tells you to stop. In some cases, you may be given additional medicines to help with side effects. Follow all directions for their use. Call your doctor or health care professional for advice if you get a fever, chills or sore throat, or other symptoms of a cold or flu. Do not treat yourself. This drug decreases your body's ability to fight infections. Try to avoid being around people who are sick. This medicine may increase your risk to bruise or bleed. Call your doctor or health care professional if you notice any unusual bleeding. Be careful brushing and flossing your teeth or using a toothpick because you may get an infection or bleed more easily. If you have any dental work done, tell your dentist you are receiving this medicine. Avoid taking products that contain aspirin, acetaminophen, ibuprofen, naproxen, or ketoprofen unless instructed by your doctor. These medicines may hide a fever. Do not become pregnant while taking this medicine. Women should inform their doctor if they wish to become pregnant or think they might be pregnant. There is a potential for serious side effects to an unborn child. Talk to your health care professional or pharmacist for more information. Do not  breast-feed an infant while taking this medicine. What side effects may I notice from receiving this medicine? Side effects that you should report to your doctor or health care professional as soon as possible: allergic reactions like skin rash, itching or hives, swelling of the face, lips, or tongue signs of infection - fever or chills, cough, sore throat, pain or difficulty passing urine signs of decreased platelets or bleeding - bruising, pinpoint red spots on the skin, black, tarry stools, nosebleeds signs of decreased red blood cells - unusually weak or tired, fainting spells, lightheadedness breathing problems changes in hearing changes in vision chest pain high blood pressure low blood counts - This drug may decrease the number of white blood cells, red blood  cells and platelets. You may be at increased risk for infections and bleeding. nausea and vomiting pain, swelling, redness or irritation at the injection site pain, tingling, numbness in the hands or feet problems with balance, talking, walking trouble passing urine or change in the amount of urine Side effects that usually do not require medical attention (report to your doctor or health care professional if they continue or are bothersome): hair loss loss of appetite metallic taste in the mouth or changes in taste This list may not describe all possible side effects. Call your doctor for medical advice about side effects. You may report side effects to FDA at 1-800-FDA-1088. Where should I keep my medicine? This drug is given in a hospital or clinic and will not be stored at home. NOTE: This sheet is a summary. It may not cover all possible information. If you have questions about this medicine, talk to your doctor, pharmacist, or health care provider.  2021 Elsevier/Gold Standard (2007-07-02 14:38:05) Blood Transfusion, Adult, Care After This sheet gives you information about how to care for yourself after your procedure.  Your doctor may also give you more specific instructions. If youhave problems or questions, contact your doctor. What can I expect after the procedure? After the procedure, it is common to have: Bruising and soreness at the IV site. A fever or chills on the day of the procedure. This may be your body's response to the new blood cells received. A headache. Follow these instructions at home: Insertion site care     Follow instructions from your doctor about how to take care of your insertion site. This is where an IV tube was put into your vein. Make sure you: Wash your hands with soap and water before and after you change your bandage (dressing). If you cannot use soap and water, use hand sanitizer. Change your bandage as told by your doctor. Check your insertion site every day for signs of infection. Check for: Redness, swelling, or pain. Bleeding from the site. Warmth. Pus or a bad smell. General instructions Take over-the-counter and prescription medicines only as told by your doctor. Rest as told by your doctor. Go back to your normal activities as told by your doctor. Keep all follow-up visits as told by your doctor. This is important. Contact a doctor if: You have itching or red, swollen areas of skin (hives). You feel worried or nervous (anxious). You feel weak after doing your normal activities. You have redness, swelling, warmth, or pain around the insertion site. You have blood coming from the insertion site, and the blood does not stop with pressure. You have pus or a bad smell coming from the insertion site. Get help right away if: You have signs of a serious reaction. This may be coming from an allergy or the body's defense system (immune system). Signs include: Trouble breathing or shortness of breath. Swelling of the face or feeling warm (flushed). Fever or chills. Head, chest, or back pain. Dark pee (urine) or blood in the pee. Widespread rash. Fast  heartbeat. Feeling dizzy or light-headed. You may receive your blood transfusion in an outpatient setting. If so, youwill be told whom to contact to report any reactions. These symptoms may be an emergency. Do not wait to see if the symptoms will go away. Get medical help right away. Call your local emergency services (911 in the [place] Do not drive yourself to the hospital. Summary Bruising and soreness at the IV site are common. Check your insertion  site every day for signs of infection. Rest as told by your doctor. Go back to your normal activities as told by your doctor. Get help right away if you have signs of a serious reaction. This information is not intended to replace advice given to you by your health care provider. Make sure you discuss any questions you have with your healthcare provider. Document Revised: 09/19/2018 Document Reviewed: 09/19/2018 Elsevier Patient Education  [place].

## 2020-10-18 ENCOUNTER — Other Ambulatory Visit: Payer: Self-pay | Admitting: Physician Assistant

## 2020-10-18 DIAGNOSIS — C3491 Malignant neoplasm of unspecified part of right bronchus or lung: Secondary | ICD-10-CM

## 2020-10-20 ENCOUNTER — Ambulatory Visit (INDEPENDENT_AMBULATORY_CARE_PROVIDER_SITE_OTHER): Payer: Medicare HMO | Admitting: Pharmacist Clinician (PhC)/ Clinical Pharmacy Specialist

## 2020-10-20 DIAGNOSIS — Z79899 Other long term (current) drug therapy: Secondary | ICD-10-CM

## 2020-10-20 DIAGNOSIS — Z952 Presence of prosthetic heart valve: Secondary | ICD-10-CM

## 2020-10-20 LAB — POCT INR: INR: 3.1 — AB (ref 2.0–3.0)

## 2020-10-21 ENCOUNTER — Other Ambulatory Visit: Payer: Self-pay

## 2020-10-21 ENCOUNTER — Inpatient Hospital Stay: Payer: Medicare HMO

## 2020-10-21 ENCOUNTER — Other Ambulatory Visit: Payer: Medicare HMO

## 2020-10-21 ENCOUNTER — Telehealth: Payer: Self-pay

## 2020-10-21 DIAGNOSIS — Z95828 Presence of other vascular implants and grafts: Secondary | ICD-10-CM

## 2020-10-21 DIAGNOSIS — C3490 Malignant neoplasm of unspecified part of unspecified bronchus or lung: Secondary | ICD-10-CM

## 2020-10-21 DIAGNOSIS — Z5111 Encounter for antineoplastic chemotherapy: Secondary | ICD-10-CM | POA: Diagnosis not present

## 2020-10-21 DIAGNOSIS — Z5112 Encounter for antineoplastic immunotherapy: Secondary | ICD-10-CM | POA: Diagnosis not present

## 2020-10-21 DIAGNOSIS — C3411 Malignant neoplasm of upper lobe, right bronchus or lung: Secondary | ICD-10-CM | POA: Diagnosis not present

## 2020-10-21 DIAGNOSIS — Z79899 Other long term (current) drug therapy: Secondary | ICD-10-CM | POA: Diagnosis not present

## 2020-10-21 DIAGNOSIS — D649 Anemia, unspecified: Secondary | ICD-10-CM | POA: Diagnosis not present

## 2020-10-21 LAB — CMP (CANCER CENTER ONLY)
ALT: 16 U/L (ref 0–44)
AST: 26 U/L (ref 15–41)
Albumin: 3.1 g/dL — ABNORMAL LOW (ref 3.5–5.0)
Alkaline Phosphatase: 97 U/L (ref 38–126)
Anion gap: 7 (ref 5–15)
BUN: 20 mg/dL (ref 8–23)
CO2: 24 mmol/L (ref 22–32)
Calcium: 8.2 mg/dL — ABNORMAL LOW (ref 8.9–10.3)
Chloride: 104 mmol/L (ref 98–111)
Creatinine: 0.76 mg/dL (ref 0.61–1.24)
GFR, Estimated: >60 mL/min (ref >60)
Glucose, Bld: 80 mg/dL (ref 70–99)
Potassium: 4.5 mmol/L (ref 3.5–5.1)
Sodium: 135 mmol/L (ref 135–145)
Total Bilirubin: 0.6 mg/dL (ref 0.3–1.2)
Total Protein: 6.5 g/dL (ref 6.5–8.1)

## 2020-10-21 LAB — CBC WITH DIFFERENTIAL (CANCER CENTER ONLY)
Abs Immature Granulocytes: 0.01 10*3/uL (ref 0.00–0.07)
Basophils Absolute: 0 10*3/uL (ref 0.0–0.1)
Basophils Relative: 1 %
Eosinophils Absolute: 0 10*3/uL (ref 0.0–0.5)
Eosinophils Relative: 1 %
HCT: 24.5 % — ABNORMAL LOW (ref 39.0–52.0)
Hemoglobin: 8.1 g/dL — ABNORMAL LOW (ref 13.0–17.0)
Immature Granulocytes: 1 %
Lymphocytes Relative: 39 %
Lymphs Abs: 0.7 10*3/uL (ref 0.7–4.0)
MCH: 29.5 pg (ref 26.0–34.0)
MCHC: 33.1 g/dL (ref 30.0–36.0)
MCV: 89.1 fL (ref 80.0–100.0)
Monocytes Absolute: 0.2 10*3/uL (ref 0.1–1.0)
Monocytes Relative: 9 %
Neutro Abs: 0.9 10*3/uL — ABNORMAL LOW (ref 1.7–7.7)
Neutrophils Relative %: 49 %
Platelet Count: 66 10*3/uL — ABNORMAL LOW (ref 150–400)
RBC: 2.75 MIL/uL — ABNORMAL LOW (ref 4.22–5.81)
RDW: 18.6 % — ABNORMAL HIGH (ref 11.5–15.5)
WBC Count: 1.8 10*3/uL — ABNORMAL LOW (ref 4.0–10.5)
nRBC: 0 % (ref 0.0–0.2)

## 2020-10-21 LAB — SAMPLE TO BLOOD BANK

## 2020-10-21 MED ORDER — HEPARIN SOD (PORK) LOCK FLUSH 100 UNIT/ML IV SOLN
500.0000 [IU] | Freq: Once | INTRAVENOUS | Status: AC
  Administered 2020-10-21: 500 [IU]
  Filled 2020-10-21: qty 5

## 2020-10-21 MED ORDER — SODIUM CHLORIDE 0.9% FLUSH
10.0000 mL | Freq: Once | INTRAVENOUS | Status: AC
  Administered 2020-10-21: 10 mL
  Filled 2020-10-21: qty 10

## 2020-10-22 ENCOUNTER — Encounter: Payer: Self-pay | Admitting: Internal Medicine

## 2020-10-22 ENCOUNTER — Telehealth: Payer: Self-pay | Admitting: Cardiovascular Disease

## 2020-10-22 NOTE — Telephone Encounter (Signed)
Called patient, she advised that Lasix 20 mg as needed for swelling from what they have at home. She will have him take one daily for the next few days to see how swelling is and then stop.   I will update list.  She was advised of potassium filled foods while taking medication.

## 2020-10-22 NOTE — Telephone Encounter (Signed)
Called patient, spoke to wife. Advised that he was having some swelling in his legs. She states that he had chemo on Thursday and it has worsened since this. They called oncology and they reviewed the medications given but did not see any cause for swelling so they advised them to call Dr.C. She states he is not SOB, but the skin is slightly red in color. He does elevate his legs and when he does the swelling is better, but once he starts walking they worsen again. He does wear compression stockings already. Dr.C had given Lasix back in March of 2022, but patient did not use it so they removed it from the list in May 2022. They are asking if he can try to use this now as needed for his swelling again to see if it helps. I advised I would route to MD to advise and see if okay to take (they still have the medication on hand at home).  Thank you!

## 2020-10-22 NOTE — Telephone Encounter (Signed)
Pt c/o swelling: STAT is pt has developed SOB within 24 hours  If swelling, where is the swelling located? The knee below   How much weight have you gained and in what time span? Doesn't know, has actually lost weight from cancer  Have you gained 3 pounds in a day or 5 pounds in a week? No   Do you have a log of your daily weights (if so, list)? No   Are you currently taking a fluid pill? No   Are you currently SOB? No   Have you traveled recently? No   Pt c/o medication issue:  1. Name of Medication: Furosemide 20 MG  2. How are you currently taking this medication (dosage and times per day)? Not currently taking   3. Are you having a reaction (difficulty breathing--STAT)? No   4. What is your medication issue? [name] states this medication is prescribed to take as needed. He recently had a visit with the cancer center and got chemo. He reported to them his legs were sore and they advised him this is due to the skin being stretched from swelling. [name] told them our office had prescribed this medication to take as needed and they requested they call and confirm how he should take it before giving it to him. States the prescription is still good, doesn't advise to refill until March 2023. Please advise.

## 2020-10-22 NOTE — Telephone Encounter (Signed)
Pt walked into the [facility] c/o swelling in his legs. Discussed with Cassandra PA-C who advised the swelling is not felt to be tx related and pt has been previously advised to elevate and wear his compression socks. Also, pt was a Film/video editor.  Pt has his wife has been advised of this. Pt states he does elevate and wears the socks but not all the time (pt did not have the socks on when I met with him). I have have shown the pt have to properly elevate his legs when he is resting and advised him again to wear his socks. Of note, pt has a rx for Lasix but states he was told by Dr. Julien Nordmann or Vito Backers not to take it. I have revisited this with the providers and neither advised him to d/c his Lasix. According to pts records, his Lasix was d/c'd by his PCP because he had not taken it is 30 or more days.  I have called the pts wife, [name], and advised that they confirm with his PCP/Cardiologist regarding taking the Lasix and how he should be taking it as [facility] did not advise his to d/c the rx. [name] expressed understanding of this information and advised that pts legs are a little bit better today because he has been wearing the sock and elevating but will follow-up with PCP/Cardiology.

## 2020-10-27 ENCOUNTER — Ambulatory Visit (INDEPENDENT_AMBULATORY_CARE_PROVIDER_SITE_OTHER): Payer: Medicare HMO | Admitting: Cardiology

## 2020-10-27 ENCOUNTER — Telehealth: Payer: Self-pay

## 2020-10-27 DIAGNOSIS — Z79899 Other long term (current) drug therapy: Secondary | ICD-10-CM | POA: Diagnosis not present

## 2020-10-27 DIAGNOSIS — Z952 Presence of prosthetic heart valve: Secondary | ICD-10-CM

## 2020-10-27 LAB — POCT INR: INR: 2.5 (ref 2.0–3.0)

## 2020-10-28 ENCOUNTER — Other Ambulatory Visit: Payer: Medicare HMO

## 2020-10-28 ENCOUNTER — Other Ambulatory Visit: Payer: Self-pay | Admitting: Physician Assistant

## 2020-10-28 ENCOUNTER — Other Ambulatory Visit: Payer: Self-pay

## 2020-10-28 ENCOUNTER — Inpatient Hospital Stay: Payer: Medicare HMO

## 2020-10-28 ENCOUNTER — Other Ambulatory Visit: Payer: Self-pay | Admitting: *Deleted

## 2020-10-28 DIAGNOSIS — Z95828 Presence of other vascular implants and grafts: Secondary | ICD-10-CM

## 2020-10-28 DIAGNOSIS — C3411 Malignant neoplasm of upper lobe, right bronchus or lung: Secondary | ICD-10-CM | POA: Diagnosis not present

## 2020-10-28 DIAGNOSIS — D649 Anemia, unspecified: Secondary | ICD-10-CM

## 2020-10-28 DIAGNOSIS — C3490 Malignant neoplasm of unspecified part of unspecified bronchus or lung: Secondary | ICD-10-CM

## 2020-10-28 DIAGNOSIS — Z5111 Encounter for antineoplastic chemotherapy: Secondary | ICD-10-CM | POA: Diagnosis not present

## 2020-10-28 DIAGNOSIS — Z5112 Encounter for antineoplastic immunotherapy: Secondary | ICD-10-CM | POA: Diagnosis not present

## 2020-10-28 DIAGNOSIS — Z79899 Other long term (current) drug therapy: Secondary | ICD-10-CM | POA: Diagnosis not present

## 2020-10-28 LAB — CBC WITH DIFFERENTIAL (CANCER CENTER ONLY)
Abs Immature Granulocytes: 0.01 10*3/uL (ref 0.00–0.07)
Basophils Absolute: 0 10*3/uL (ref 0.0–0.1)
Basophils Relative: 0 %
Eosinophils Absolute: 0 10*3/uL (ref 0.0–0.5)
Eosinophils Relative: 0 %
HCT: 21 % — ABNORMAL LOW (ref 39.0–52.0)
Hemoglobin: 7 g/dL — ABNORMAL LOW (ref 13.0–17.0)
Immature Granulocytes: 0 %
Lymphocytes Relative: 27 %
Lymphs Abs: 0.7 10*3/uL (ref 0.7–4.0)
MCH: 29.9 pg (ref 26.0–34.0)
MCHC: 33.3 g/dL (ref 30.0–36.0)
MCV: 89.7 fL (ref 80.0–100.0)
Monocytes Absolute: 0.3 10*3/uL (ref 0.1–1.0)
Monocytes Relative: 13 %
Neutro Abs: 1.6 10*3/uL — ABNORMAL LOW (ref 1.7–7.7)
Neutrophils Relative %: 60 %
Platelet Count: 67 10*3/uL — ABNORMAL LOW (ref 150–400)
RBC: 2.34 MIL/uL — ABNORMAL LOW (ref 4.22–5.81)
RDW: 19.2 % — ABNORMAL HIGH (ref 11.5–15.5)
WBC Count: 2.6 10*3/uL — ABNORMAL LOW (ref 4.0–10.5)
nRBC: 0 % (ref 0.0–0.2)

## 2020-10-28 LAB — CMP (CANCER CENTER ONLY)
ALT: 23 U/L (ref 0–44)
AST: 28 U/L (ref 15–41)
Albumin: 3.2 g/dL — ABNORMAL LOW (ref 3.5–5.0)
Alkaline Phosphatase: 84 U/L (ref 38–126)
Anion gap: 7 (ref 5–15)
BUN: 17 mg/dL (ref 8–23)
CO2: 25 mmol/L (ref 22–32)
Calcium: 8.1 mg/dL — ABNORMAL LOW (ref 8.9–10.3)
Chloride: 105 mmol/L (ref 98–111)
Creatinine: 0.74 mg/dL (ref 0.61–1.24)
GFR, Estimated: >60 mL/min (ref >60)
Glucose, Bld: 118 mg/dL — ABNORMAL HIGH (ref 70–99)
Potassium: 4.1 mmol/L (ref 3.5–5.1)
Sodium: 137 mmol/L (ref 135–145)
Total Bilirubin: 0.5 mg/dL (ref 0.3–1.2)
Total Protein: 6.3 g/dL — ABNORMAL LOW (ref 6.5–8.1)

## 2020-10-28 LAB — PREPARE RBC (CROSSMATCH)

## 2020-10-28 LAB — SAMPLE TO BLOOD BANK

## 2020-10-28 MED ORDER — SODIUM CHLORIDE 0.9% FLUSH
10.0000 mL | Freq: Once | INTRAVENOUS | Status: AC
  Administered 2020-10-28: 10 mL
  Filled 2020-10-28: qty 10

## 2020-10-28 MED ORDER — HEPARIN SOD (PORK) LOCK FLUSH 100 UNIT/ML IV SOLN
500.0000 [IU] | Freq: Once | INTRAVENOUS | Status: AC
  Administered 2020-10-28: 500 [IU]
  Filled 2020-10-28: qty 5

## 2020-10-28 NOTE — Patient Instructions (Signed)

## 2020-10-29 ENCOUNTER — Inpatient Hospital Stay: Payer: Medicare HMO

## 2020-10-29 DIAGNOSIS — D649 Anemia, unspecified: Secondary | ICD-10-CM | POA: Diagnosis not present

## 2020-10-29 DIAGNOSIS — Z5111 Encounter for antineoplastic chemotherapy: Secondary | ICD-10-CM | POA: Diagnosis not present

## 2020-10-29 DIAGNOSIS — C3411 Malignant neoplasm of upper lobe, right bronchus or lung: Secondary | ICD-10-CM | POA: Diagnosis not present

## 2020-10-29 DIAGNOSIS — Z79899 Other long term (current) drug therapy: Secondary | ICD-10-CM | POA: Diagnosis not present

## 2020-10-29 DIAGNOSIS — Z5112 Encounter for antineoplastic immunotherapy: Secondary | ICD-10-CM | POA: Diagnosis not present

## 2020-10-29 LAB — TSH: TSH: 1.132 u[IU]/mL (ref 0.320–4.118)

## 2020-10-29 MED ORDER — DIPHENHYDRAMINE HCL 25 MG PO CAPS
ORAL_CAPSULE | ORAL | Status: AC
  Filled 2020-10-29: qty 1

## 2020-10-29 MED ORDER — ACETAMINOPHEN 325 MG PO TABS
650.0000 mg | ORAL_TABLET | Freq: Once | ORAL | Status: AC
  Administered 2020-10-29: 650 mg via ORAL

## 2020-10-29 MED ORDER — DIPHENHYDRAMINE HCL 25 MG PO CAPS
25.0000 mg | ORAL_CAPSULE | Freq: Once | ORAL | Status: AC
  Administered 2020-10-29: 25 mg via ORAL

## 2020-10-29 MED ORDER — HEPARIN SOD (PORK) LOCK FLUSH 100 UNIT/ML IV SOLN
250.0000 [IU] | INTRAVENOUS | Status: AC | PRN
  Administered 2020-10-29: 250 [IU]
  Filled 2020-10-29: qty 5

## 2020-10-29 MED ORDER — SODIUM CHLORIDE 0.9% FLUSH
10.0000 mL | INTRAVENOUS | Status: AC | PRN
  Administered 2020-10-29: 10 mL
  Filled 2020-10-29: qty 10

## 2020-10-29 MED ORDER — ACETAMINOPHEN 325 MG PO TABS
ORAL_TABLET | ORAL | Status: AC
  Filled 2020-10-29: qty 2

## 2020-10-29 MED ORDER — SODIUM CHLORIDE 0.9% IV SOLUTION
250.0000 mL | Freq: Once | INTRAVENOUS | Status: AC
  Administered 2020-10-29: 250 mL via INTRAVENOUS
  Filled 2020-10-29: qty 250

## 2020-10-29 NOTE — Patient Instructions (Signed)
Blood Transfusion, Adult, Care After This sheet gives you information about how to care for yourself after your procedure. Your doctor may also give you more specific instructions. If youhave problems or questions, contact your doctor. What can I expect after the procedure? After the procedure, it is common to have: Bruising and soreness at the IV site. A fever or chills on the day of the procedure. This may be your body's response to the new blood cells received. A headache. Follow these instructions at home: Insertion site care     Follow instructions from your doctor about how to take care of your insertion site. This is where an IV tube was put into your vein. Make sure you: Wash your hands with soap and water before and after you change your bandage (dressing). If you cannot use soap and water, use hand sanitizer. Change your bandage as told by your doctor. Check your insertion site every day for signs of infection. Check for: Redness, swelling, or pain. Bleeding from the site. Warmth. Pus or a bad smell. General instructions Take over-the-counter and prescription medicines only as told by your doctor. Rest as told by your doctor. Go back to your normal activities as told by your doctor. Keep all follow-up visits as told by your doctor. This is important. Contact a doctor if: You have itching or red, swollen areas of skin (hives). You feel worried or nervous (anxious). You feel weak after doing your normal activities. You have redness, swelling, warmth, or pain around the insertion site. You have blood coming from the insertion site, and the blood does not stop with pressure. You have pus or a bad smell coming from the insertion site. Get help right away if: You have signs of a serious reaction. This may be coming from an allergy or the body's defense system (immune system). Signs include: Trouble breathing or shortness of breath. Swelling of the face or feeling warm  (flushed). Fever or chills. Head, chest, or back pain. Dark pee (urine) or blood in the pee. Widespread rash. Fast heartbeat. Feeling dizzy or light-headed. You may receive your blood transfusion in an outpatient setting. If so, youwill be told whom to contact to report any reactions. These symptoms may be an emergency. Do not wait to see if the symptoms will go away. Get medical help right away. Call your local emergency services (911 in the [place] Do not drive yourself to the hospital. Summary Bruising and soreness at the IV site are common. Check your insertion site every day for signs of infection. Rest as told by your doctor. Go back to your normal activities as told by your doctor. Get help right away if you have signs of a serious reaction. This information is not intended to replace advice given to you by your health care provider. Make sure you discuss any questions you have with your healthcare provider. Document Revised: 09/19/2018 Document Reviewed: 09/19/2018 Elsevier Patient Education  2022 Elsevier Inc.  

## 2020-10-30 LAB — TYPE AND SCREEN
ABO/RH(D): O POS
Antibody Screen: NEGATIVE
Unit division: 0
Unit division: 0

## 2020-10-30 LAB — BPAM RBC
Blood Product Expiration Date: 202208242359
Blood Product Expiration Date: 202208252359
ISSUE DATE / TIME: 202207220931
ISSUE DATE / TIME: 202207220931
Unit Type and Rh: 5100
Unit Type and Rh: 5100

## 2020-11-01 ENCOUNTER — Ambulatory Visit (HOSPITAL_COMMUNITY)
Admission: RE | Admit: 2020-11-01 | Discharge: 2020-11-01 | Disposition: A | Discharge status: Home / Self Care | Payer: Medicare HMO | Source: Ambulatory Visit | Attending: Physician Assistant | Admitting: Physician Assistant

## 2020-11-01 ENCOUNTER — Other Ambulatory Visit: Payer: Self-pay

## 2020-11-01 DIAGNOSIS — R911 Solitary pulmonary nodule: Secondary | ICD-10-CM | POA: Diagnosis not present

## 2020-11-01 DIAGNOSIS — J90 Pleural effusion, not elsewhere classified: Secondary | ICD-10-CM | POA: Diagnosis not present

## 2020-11-01 DIAGNOSIS — C3490 Malignant neoplasm of unspecified part of unspecified bronchus or lung: Secondary | ICD-10-CM | POA: Insufficient documentation

## 2020-11-01 DIAGNOSIS — J439 Emphysema, unspecified: Secondary | ICD-10-CM | POA: Diagnosis not present

## 2020-11-01 DIAGNOSIS — I700 Atherosclerosis of aorta: Secondary | ICD-10-CM | POA: Diagnosis not present

## 2020-11-01 MED ORDER — IOHEXOL 350 MG/ML SOLN
60.0000 mL | Freq: Once | INTRAVENOUS | Status: AC | PRN
  Administered 2020-11-01: 60 mL via INTRAVENOUS

## 2020-11-01 NOTE — Telephone Encounter (Signed)
Done

## 2020-11-03 LAB — POCT INR: INR: 1.7 — AB (ref 2.0–3.0)

## 2020-11-04 ENCOUNTER — Other Ambulatory Visit: Payer: Self-pay | Admitting: Physician Assistant

## 2020-11-04 ENCOUNTER — Ambulatory Visit (INDEPENDENT_AMBULATORY_CARE_PROVIDER_SITE_OTHER): Payer: Medicare HMO | Admitting: Cardiology

## 2020-11-04 ENCOUNTER — Inpatient Hospital Stay: Payer: Medicare HMO

## 2020-11-04 ENCOUNTER — Other Ambulatory Visit: Payer: Self-pay

## 2020-11-04 ENCOUNTER — Other Ambulatory Visit: Payer: Medicare HMO

## 2020-11-04 ENCOUNTER — Inpatient Hospital Stay (HOSPITAL_BASED_OUTPATIENT_CLINIC_OR_DEPARTMENT_OTHER): Payer: Medicare HMO | Admitting: Internal Medicine

## 2020-11-04 ENCOUNTER — Other Ambulatory Visit: Payer: Self-pay | Admitting: Internal Medicine

## 2020-11-04 VITALS — BP 128/71 | HR 66 | Temp 97.8°F | Resp 18 | Wt 165.2 lb

## 2020-11-04 DIAGNOSIS — D649 Anemia, unspecified: Secondary | ICD-10-CM | POA: Diagnosis not present

## 2020-11-04 DIAGNOSIS — Z95828 Presence of other vascular implants and grafts: Secondary | ICD-10-CM

## 2020-11-04 DIAGNOSIS — Z5111 Encounter for antineoplastic chemotherapy: Secondary | ICD-10-CM | POA: Diagnosis not present

## 2020-11-04 DIAGNOSIS — Z79899 Other long term (current) drug therapy: Secondary | ICD-10-CM

## 2020-11-04 DIAGNOSIS — C3490 Malignant neoplasm of unspecified part of unspecified bronchus or lung: Secondary | ICD-10-CM

## 2020-11-04 DIAGNOSIS — Z952 Presence of prosthetic heart valve: Secondary | ICD-10-CM

## 2020-11-04 DIAGNOSIS — C3411 Malignant neoplasm of upper lobe, right bronchus or lung: Secondary | ICD-10-CM | POA: Diagnosis not present

## 2020-11-04 DIAGNOSIS — Z5112 Encounter for antineoplastic immunotherapy: Secondary | ICD-10-CM

## 2020-11-04 LAB — CBC WITH DIFFERENTIAL (CANCER CENTER ONLY)
Abs Immature Granulocytes: 0.02 10*3/uL (ref 0.00–0.07)
Basophils Absolute: 0 10*3/uL (ref 0.0–0.1)
Basophils Relative: 0 %
Eosinophils Absolute: 0 10*3/uL (ref 0.0–0.5)
Eosinophils Relative: 0 %
HCT: 26.6 % — ABNORMAL LOW (ref 39.0–52.0)
Hemoglobin: 8.7 g/dL — ABNORMAL LOW (ref 13.0–17.0)
Immature Granulocytes: 1 %
Lymphocytes Relative: 15 %
Lymphs Abs: 0.4 10*3/uL — ABNORMAL LOW (ref 0.7–4.0)
MCH: 28.9 pg (ref 26.0–34.0)
MCHC: 32.7 g/dL (ref 30.0–36.0)
MCV: 88.4 fL (ref 80.0–100.0)
Monocytes Absolute: 0.3 10*3/uL (ref 0.1–1.0)
Monocytes Relative: 10 %
Neutro Abs: 2.1 10*3/uL (ref 1.7–7.7)
Neutrophils Relative %: 74 %
Platelet Count: 120 10*3/uL — ABNORMAL LOW (ref 150–400)
RBC: 3.01 MIL/uL — ABNORMAL LOW (ref 4.22–5.81)
RDW: 19.8 % — ABNORMAL HIGH (ref 11.5–15.5)
WBC Count: 2.8 10*3/uL — ABNORMAL LOW (ref 4.0–10.5)
nRBC: 0 % (ref 0.0–0.2)

## 2020-11-04 LAB — CMP (CANCER CENTER ONLY)
ALT: 13 U/L (ref 0–44)
AST: 22 U/L (ref 15–41)
Albumin: 3.1 g/dL — ABNORMAL LOW (ref 3.5–5.0)
Alkaline Phosphatase: 98 U/L (ref 38–126)
Anion gap: 8 (ref 5–15)
BUN: 22 mg/dL (ref 8–23)
CO2: 25 mmol/L (ref 22–32)
Calcium: 8.7 mg/dL — ABNORMAL LOW (ref 8.9–10.3)
Chloride: 106 mmol/L (ref 98–111)
Creatinine: 0.94 mg/dL (ref 0.61–1.24)
GFR, Estimated: >60 mL/min (ref >60)
Glucose, Bld: 177 mg/dL — ABNORMAL HIGH (ref 70–99)
Potassium: 4.3 mmol/L (ref 3.5–5.1)
Sodium: 139 mmol/L (ref 135–145)
Total Bilirubin: 0.5 mg/dL (ref 0.3–1.2)
Total Protein: 6.6 g/dL (ref 6.5–8.1)

## 2020-11-04 MED ORDER — CYANOCOBALAMIN 1000 MCG/ML IJ SOLN
INTRAMUSCULAR | Status: AC
  Filled 2020-11-04: qty 1

## 2020-11-04 MED ORDER — SODIUM CHLORIDE 0.9 % IV SOLN
Freq: Once | INTRAVENOUS | Status: AC
  Filled 2020-11-04: qty 250

## 2020-11-04 MED ORDER — HEPARIN SOD (PORK) LOCK FLUSH 100 UNIT/ML IV SOLN
500.0000 [IU] | Freq: Once | INTRAVENOUS | Status: AC | PRN
  Administered 2020-11-04: 500 [IU]
  Filled 2020-11-04: qty 5

## 2020-11-04 MED ORDER — PALONOSETRON HCL INJECTION 0.25 MG/5ML
INTRAVENOUS | Status: AC
  Filled 2020-11-04: qty 5

## 2020-11-04 MED ORDER — PALONOSETRON HCL INJECTION 0.25 MG/5ML
0.2500 mg | Freq: Once | INTRAVENOUS | Status: AC
  Administered 2020-11-04: 0.25 mg via INTRAVENOUS

## 2020-11-04 MED ORDER — SODIUM CHLORIDE 0.9 % IV SOLN
500.0000 mg/m2 | Freq: Once | INTRAVENOUS | Status: AC
  Administered 2020-11-04: 1000 mg via INTRAVENOUS
  Filled 2020-11-04: qty 40

## 2020-11-04 MED ORDER — CYANOCOBALAMIN 1000 MCG/ML IJ SOLN
1000.0000 ug | Freq: Once | INTRAMUSCULAR | Status: AC
  Administered 2020-11-04: 1000 ug via INTRAMUSCULAR

## 2020-11-04 MED ORDER — SODIUM CHLORIDE 0.9 % IV SOLN
476.5000 mg | Freq: Once | INTRAVENOUS | Status: AC
  Administered 2020-11-04: 480 mg via INTRAVENOUS
  Filled 2020-11-04: qty 48

## 2020-11-04 MED ORDER — SODIUM CHLORIDE 0.9 % IV SOLN
200.0000 mg | Freq: Once | INTRAVENOUS | Status: AC
  Administered 2020-11-04: 200 mg via INTRAVENOUS
  Filled 2020-11-04: qty 8

## 2020-11-04 MED ORDER — SODIUM CHLORIDE 0.9% FLUSH
10.0000 mL | Freq: Once | INTRAVENOUS | Status: AC
  Administered 2020-11-04: 10 mL
  Filled 2020-11-04: qty 10

## 2020-11-04 MED ORDER — SODIUM CHLORIDE 0.9% FLUSH
10.0000 mL | INTRAVENOUS | Status: DC | PRN
  Administered 2020-11-04: 10 mL
  Filled 2020-11-04: qty 10

## 2020-11-04 MED ORDER — SODIUM CHLORIDE 0.9 % IV SOLN
10.0000 mg | Freq: Once | INTRAVENOUS | Status: AC
  Administered 2020-11-04: 10 mg via INTRAVENOUS
  Filled 2020-11-04: qty 10

## 2020-11-04 MED ORDER — SODIUM CHLORIDE 0.9 % IV SOLN
150.0000 mg | Freq: Once | INTRAVENOUS | Status: AC
  Administered 2020-11-04: 150 mg via INTRAVENOUS
  Filled 2020-11-04: qty 150

## 2020-11-04 NOTE — Progress Notes (Signed)
[facility] [phone]   [phone]  OFFICE PROGRESS NOTE  [name], MD [address] [place] [address]  DIAGNOSIS: Recurrent non-small cell lung cancer, adenocarcinoma initially diagnosed as stage IIIB (T3, N2, M0) non-small cell lung cancer, adenocarcinoma presented with right upper lobe lung mass in addition to right hilar and mediastinal lymphadenopathy. Diagnosed in December 08, 2019. In April 2022, he had signs of disease recurrence with extensive pleural involvement, likely extending into extrapleural fat, in the RIGHT chest compatible with disease recurrence and associated with nodal disease in the mediastinum. 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 Equivocal uptake in the LEFT adrenal gland.    Molecular Studies: Negative for actionable mutations   PDL1: 1-49%   PRIOR THERAPY:  1) Right upper lobectomy with lymph node sampling under the care of Dr. Kipp Brood on December 08, 2019. 2) Adjuvant systemic chemotherapy with Cisplatin 75 mg/m2 and Alimta 500 mg/m2 IV every 3 weeks. Last dose on 04/13/20.  Status post 4 cycles.   CURRENT THERAPY: Palliative systemic chemotherapy with carboplatin for an AUC of 5, Alimta, 500 mg/m2, and Keytruda 200 mg/m2. First dose expected on 09/01/20.  Status post 3 cycles.  INTERVAL HISTORY: Marcus [name] 75 y.o. male returns to the clinic today for follow-up visit accompanied by his wife.  The patient is feeling fine today with no concerning complaints except for fatigue.  He denied having any current chest pain, shortness of breath, cough or hemoptysis.  He felt much better after starting systemic chemotherapy.  He tolerated the last cycle of his treatment well.  He denied having any nausea, vomiting, diarrhea or constipation.  He has no headache or visual changes.  He has no significant weight loss or night sweats.  He had repeat CT scan of the chest, abdomen  pelvis performed recently and he is here for evaluation and discussion of his scan results.  MEDICAL HISTORY: Past Medical History:  Diagnosis Date   Atrial fibrillation ([place])    Dyspnea    with exertion    Dysrhythmia    Hypertension    Obstructive sleep apnea 10/25/2007   cpap   S/P AVR    2D ECHO, 04/25/2011 - EF >55%, Right ventricle-mild-moderately dilated   Swelling of limb    LEA VENOUS, 08/22/2009 - no evidence of deep vein or superficial thrombosis; partially rupturing Baker's Cyst    ALLERGIES:  is allergic to amlodipine, sulfa antibiotics, tetanus toxoids, crestor [rosuvastatin], and penicillins.  MEDICATIONS:  Current Outpatient Medications  Medication Sig Dispense Refill   ALPRAZolam (XANAX) 0.25 MG tablet Take 1 tablet (0.25 mg total) by mouth every 8 (eight) hours as needed for up to 20 doses for anxiety. 20 tablet 0   atorvastatin (LIPITOR) 80 MG tablet TAKE 1 TABLET BY MOUTH EVERY DAY (Patient taking differently: Take 80 mg by mouth daily.) 90 tablet 2   cetirizine (ZYRTEC) 10 MG tablet Take 10 mg by mouth daily.     enoxaparin (LOVENOX) 120 MG/0.8ML injection Inject 0.8 mLs (120 mg total) into the skin 1 tonight and 2 tomorrow AM/PM 2.4 mL 0   folic acid (FOLVITE) 1 MG tablet TAKE 1 TABLET BY MOUTH EVERY DAY 30 tablet 2   furosemide (LASIX) 20 MG tablet Take 20 mg by mouth as needed for edema.     HYDROcodone-acetaminophen (NORCO/VICODIN) 5-325 MG tablet Take 1 tablet by mouth every 4 (four) hours as needed for  moderate pain. 45 tablet 0   lidocaine-prilocaine (EMLA) cream Apply 1 application topically as needed. 30 g 2   Multiple Vitamins-Minerals (EYE SUPPORT PO) Take 1 tablet by mouth daily. preservision     prochlorperazine (COMPAZINE) 10 MG tablet Take 1 tablet (10 mg total) by mouth every 6 (six) hours as needed. 30 tablet 2   ramipril (ALTACE) 10 MG capsule TAKE 1 CAPSULE BY MOUTH EVERY DAY (Patient taking differently: Take 10 mg by mouth daily.) 90 capsule 3    warfarin (COUMADIN) 10 MG tablet TAKE 1 TO 1 AND 1/2 TABLETS DAILY AS DIRECTED BY COUMADIN CLINIC 135 tablet 1   No current facility-administered medications for this visit.    SURGICAL HISTORY:  Past Surgical History:  Procedure Laterality Date   CARDIAC CATHETERIZATION Bilateral 05/10/2007   Significant 1-vessel disease, severely dilated aortic root with moderate severe aortic insufficiency   CARDIAC SURGERY     CARDIOVERSION  08/02/2007   150 Joule biphasic shock with restoration of sinus rhythm. Heart rate 60.   cataract surgery      COLONOSCOPY WITH PROPOFOL N/A 12/15/2016   Procedure: COLONOSCOPY WITH PROPOFOL;  Surgeon: Carol Ada, MD;  Location: [facility];  Service: Endoscopy;  Laterality: N/A;   EYE SURGERY     INTERCOSTAL NERVE BLOCK Right 12/08/2019   Procedure: INTERCOSTAL NERVE BLOCK;  Surgeon: [name], MD;  Location: [facility];  Service: Thoracic;  Laterality: Right;   IR IMAGING GUIDED PORT INSERTION  09/28/2020   IR THORACENTESIS ASP PLEURAL SPACE W/IMG GUIDE  08/05/2020   NM MYOVIEW LTD  04/08/2007   No evidence of inducible myocardial ischemia   NODE DISSECTION Right 12/08/2019   Procedure: NODE DISSECTION;  Surgeon: [name], MD;  Location: [facility];  Service: Thoracic;  Laterality: Right;   THYROIDECTOMY, PARTIAL     torn meniscus in right knee surgery       REVIEW OF SYSTEMS:  Constitutional: positive for fatigue Eyes: negative Ears, nose, mouth, throat, and face: negative Respiratory: negative Cardiovascular: negative Gastrointestinal: negative Genitourinary:negative Integument/breast: negative Hematologic/lymphatic: negative Musculoskeletal:negative Neurological: negative Behavioral/Psych: negative Endocrine: negative Allergic/Immunologic: negative   PHYSICAL EXAMINATION: General appearance: alert, cooperative, fatigued, and no distress Head: Normocephalic, without obvious abnormality, atraumatic Neck: no adenopathy, no JVD, supple,  symmetrical, trachea midline, and thyroid not enlarged, symmetric, no tenderness/mass/nodules Lymph nodes: Cervical, supraclavicular, and axillary nodes normal. Resp: clear to auscultation bilaterally Back: symmetric, no curvature. ROM normal. No CVA tenderness. Cardio: regular rate and rhythm, S1, S2 normal, no murmur, click, rub or gallop GI: soft, non-tender; bowel sounds normal; no masses,  no organomegaly Extremities: extremities normal, atraumatic, no cyanosis or edema Neurologic: Alert and oriented X 3, normal strength and tone. Normal symmetric reflexes. Normal coordination and gait  ECOG PERFORMANCE STATUS: 1 - Symptomatic but completely ambulatory  Blood pressure 128/71, pulse 66, temperature 97.8 F (36.6 C), temperature source Oral, resp. rate 18, weight 165 lb 3.2 oz (74.9 kg), SpO2 98 %.  LABORATORY DATA: Lab Results  Component Value Date   WBC 2.8 (L) 11/04/2020   HGB 8.7 (L) 11/04/2020   HCT 26.6 (L) 11/04/2020   MCV 88.4 11/04/2020   PLT 120 (L) 11/04/2020      Chemistry      Component Value Date/Time   NA 137 10/28/2020 1549   NA 139 10/28/2019 1057   K 4.1 10/28/2020 1549   CL 105 10/28/2020 1549   CO2 25 10/28/2020 1549   BUN 17 10/28/2020 1549   BUN  10 10/28/2019 1057   CREATININE 0.74 10/28/2020 1549      Component Value Date/Time   CALCIUM 8.1 (L) 10/28/2020 1549   ALKPHOS 84 10/28/2020 1549   AST 28 10/28/2020 1549   ALT 23 10/28/2020 1549   BILITOT 0.5 10/28/2020 1549       RADIOGRAPHIC STUDIES: CT Chest W Contrast  Result Date: 11/02/2020 CLINICAL DATA:  Primary Cancer Type: Lung Imaging Indication: Assess response to therapy Interval therapy since last imaging? Yes Initial Cancer Diagnosis Date: 12/08/2019; Established by: Biopsy-proven Detailed Pathology: Stage IIIb non-small cell lung cancer, adenocarcinoma. Primary Tumor location: Right upper lobe. Recurrence? Yes; Date(s) of recurrence: 08/20/2020; Established by: Biopsy-proven  Surgeries: Right upper lobectomy 12/08/2019.  Partial thyroidectomy. Chemotherapy: Yes; Ongoing? Yes; Most recent administration: 10/14/2020 Immunotherapy?  Yes; Type: Keytruda; Ongoing? Yes Radiation therapy? No EXAM: CT CHEST WITH CONTRAST TECHNIQUE: Multidetector CT imaging of the chest was performed during intravenous contrast administration. CONTRAST:  65m OMNIPAQUE IOHEXOL 350 MG/ML SOLN COMPARISON:  08/20/2020 PET-CT. Most recent CT chest, abdomen and pelvis 08/01/2020. FINDINGS: Cardiovascular: Normal heart size. No significant pericardial effusion/thickening. Aortic valve prosthesis in place. Three-vessel coronary atherosclerosis. Right internal jugular Port-A-Cath terminates at the cavoatrial junction. Atherosclerotic nonaneurysmal thoracic aorta. Top-normal caliber main pulmonary artery (3.0 cm diameter). No central pulmonary emboli. Mediastinum/Nodes: No discrete thyroid nodules. Unremarkable esophagus. No axillary adenopathy. Mildly enlarged 1.0 cm short axis diameter subcarinal node (series 2/image 76), stable from 1.0 cm on 08/01/2020 chest CT using similar measurement technique. Indistinct subcentimeter clustered nodularity in the high right internal mammary region (series 2/image 44), not substantially changed since 08/01/2020. There is a heterogeneously enhancing right pericardiophrenic 3.4 x 2.6 cm mass (series 2/image 116), increased from 2.1 x 1.2 cm on 08/20/2020 PET-CT and new since 08/01/2020 chest CT. No hilar adenopathy. Lungs/Pleura: No pneumothorax. Trace dependent right pleural effusion, mildly decreased from 08/20/2020 PET-CT. Scattered tiny subcentimeter right pleural nodules throughout the right pleural space, for example 0.4 cm in the posterior lower right pleural space (series 2/image 123) and 0.4 cm in the peripheral right mid pleural space (series 2/image 70), not discretely visualized on prior scans. A few scattered nodules along the right fissures, largest 0.7 cm (series  5/image 69), new. No left pleural effusion. Status post right upper lobectomy. Mild-to-moderate centrilobular and paraseptal emphysema. No acute consolidative airspace disease, lung masses or significant pulmonary nodules. Scattered mild interlobular septal thickening throughout right lung, most prominent in the superior right middle lobe, similar. Upper abdomen: Simple 2.7 cm posterior upper right renal cyst. Musculoskeletal: No aggressive appearing focal osseous lesions. Mild thoracic spondylosis. Intact sternotomy wires. IMPRESSION: 1. Findings compatible with mild progression of metastatic disease in the right pleural space and right mediastinum. Widespread scattered subcentimeter right pleural nodularity appears new. New heterogeneously enhancing right pericardiophrenic mass. 2. Similar interlobular septal thickening in the right lung, compatible with lymphangitic carcinomatosis. 3. Similar subpleural mediastinal nodularity in the right internal mammary region compatible with metastatic disease. 4. Trace dependent right pleural effusion, mildly decreased from 08/20/2020 PET-CT. 5. Mild subcarinal adenopathy is stable. 6. Three-vessel coronary atherosclerosis. 7. Aortic Atherosclerosis (ICD10-I70.0) and Emphysema (ICD10-J43.9). Electronically Signed   By: JIlona SorrelM.D.   On: 11/02/2020 12:04    ASSESSMENT AND PLAN: This is a very pleasant 75years old white male with metastatic non-small cell lung cancer initially diagnosed as stage IIIb non-small cell lung cancer, adenocarcinoma presented with right upper lobe lung mass in addition to right hilar and mediastinal lymphadenopathy diagnosed in August 2021 with  negative actionable mutation and PD-L1 expression in the range of 1-49%. The patient declined enrollment in the alliance clinical trial for treatment with systemic chemotherapy plus/minus immunotherapy. He underwent adjuvant systemic chemotherapy with cisplatin 75 mg/M2 and Alimta 500 mg/M2 every 3  weeks.  Status post 4 cycles.  He tolerated his treatment fairly well with no concerning adverse effects. The patient had evidence for disease recurrence in March 2022 with pleural-based metastasis that was biopsy-proven to be recurrent adenocarcinoma of the lung. He had molecular studies by [facility] that showed no actionable mutations. The patient is currently undergoing systemic chemotherapy with carboplatin for AUC of 5, Alimta 500 Mg/M2 and Keytruda 200 Mg IV every 3 weeks status post 3 cycles.  The patient continues to tolerate his treatment well with no concerning adverse effects and he felt much better after starting systemic chemotherapy with less respiratory symptoms. He had repeat CT scan of the chest, abdomen pelvis performed recently.  I personally and independently reviewed the scan images and discussed the result and showed the images to the patient and his wife.  Definitely there are some areas of improvement in his scan compared to April 2022 but there was some other areas of mild disease progression including the suspicious lesion in the right costophrenic angle. I had a lengthy discussion with the patient and his wife about his condition and treatment options.  I given the option of continuing his current treatment for at least 2 more cycles with repeat imaging studies and if there is any further progression to switch to a second line chemotherapy versus switching treatment now to docetaxel and Cyramza based on the reading of the report versus palliative care.  After a lengthy discussion of all the options the patient and his wife would like to continue with 2 more cycles of the same regimen for now. He will proceed with cycle #4 today. I will see him back for follow-up visit in 3 weeks for evaluation before starting cycle #5 and I will order imaging studies after the next cycle of his treatment. The patient was also advised to call immediately if he has any other concerning symptoms  in the interval.  The patient voices understanding of current disease status and treatment options and is in agreement with the current care plan.  All questions were answered. The patient knows to call the clinic with any problems, questions or concerns. We can certainly see the patient much sooner if necessary.  The total time spent in the appointment was 35 minutes.  Disclaimer: This note was dictated with voice recognition software. Similar sounding words can inadvertently be transcribed and may not be corrected upon review.

## 2020-11-04 NOTE — Patient Instructions (Signed)
[facility]  Discharge Instructions: Thank you for choosing [facility] to provide your oncology and hematology care.   If you have a lab appointment with the [facility], please go directly to the [facility] and check in at the registration area.   Wear comfortable clothing and clothing appropriate for easy access to any Portacath or PICC line.   We strive to give you quality time with your provider. You may need to reschedule your appointment if you arrive late (15 or more minutes).  Arriving late affects you and other patients whose appointments are after yours.  Also, if you miss three or more appointments without notifying the office, you may be dismissed from the clinic at the provider's discretion.      For prescription refill requests, have your pharmacy contact our office and allow 72 hours for refills to be completed.    Today you received the following chemotherapy and/or immunotherapy agents: Keytruda, Alimta, Carboplatin     To help prevent nausea and vomiting after your treatment, we encourage you to take your nausea medication as directed.  BELOW ARE SYMPTOMS THAT SHOULD BE REPORTED IMMEDIATELY: *FEVER GREATER THAN 100.4 F (38 C) OR HIGHER *CHILLS OR SWEATING *NAUSEA AND VOMITING THAT IS NOT CONTROLLED WITH YOUR NAUSEA MEDICATION *UNUSUAL SHORTNESS OF BREATH *UNUSUAL BRUISING OR BLEEDING *URINARY PROBLEMS (pain or burning when urinating, or frequent urination) *BOWEL PROBLEMS (unusual diarrhea, constipation, pain near the anus) TENDERNESS IN MOUTH AND THROAT WITH OR WITHOUT PRESENCE OF ULCERS (sore throat, sores in mouth, or a toothache) UNUSUAL RASH, SWELLING OR PAIN  UNUSUAL VAGINAL DISCHARGE OR ITCHING   Items with * indicate a potential emergency and should be followed up as soon as possible or go to the Emergency Department if any problems should occur.  Please show the CHEMOTHERAPY ALERT CARD or IMMUNOTHERAPY ALERT  CARD at check-in to the Emergency Department and triage nurse.  Should you have questions after your visit or need to cancel or reschedule your appointment, please contact [facility]  Dept: [phone]  and follow the prompts.  Office hours are 8:00 a.m. to 4:30 p.m. Monday - Friday. Please note that voicemails left after 4:00 p.m. may not be returned until the following business day.  We are closed weekends and major holidays. You have access to a nurse at all times for urgent questions. Please call the main number to the clinic Dept: [phone] and follow the prompts.   For any non-urgent questions, you may also contact your provider using MyChart. We now offer e-Visits for anyone 58 and older to request care online for non-urgent symptoms. For details visit [email or web address].   Also download the MyChart app! Go to the app store, search "MyChart", open the app, select [facility], and log in with your MyChart username and password.  Due to Covid, a mask is required upon entering the hospital/clinic. If you do not have a mask, one will be given to you upon arrival. For doctor visits, patients may have 1 support person aged 55 or older with them. For treatment visits, patients cannot have anyone with them due to current Covid guidelines and our immunocompromised population.

## 2020-11-10 ENCOUNTER — Ambulatory Visit (INDEPENDENT_AMBULATORY_CARE_PROVIDER_SITE_OTHER): Payer: Medicare HMO | Admitting: Cardiovascular Disease

## 2020-11-10 DIAGNOSIS — Z79899 Other long term (current) drug therapy: Secondary | ICD-10-CM | POA: Diagnosis not present

## 2020-11-10 DIAGNOSIS — Z952 Presence of prosthetic heart valve: Secondary | ICD-10-CM

## 2020-11-10 LAB — POCT INR: INR: 2.3 (ref 2.0–3.0)

## 2020-11-11 ENCOUNTER — Inpatient Hospital Stay: Payer: Medicare HMO | Attending: Internal Medicine

## 2020-11-11 ENCOUNTER — Telehealth: Payer: Self-pay | Admitting: Family Medicine

## 2020-11-11 ENCOUNTER — Other Ambulatory Visit: Payer: Self-pay

## 2020-11-11 DIAGNOSIS — R531 Weakness: Secondary | ICD-10-CM | POA: Insufficient documentation

## 2020-11-11 DIAGNOSIS — C782 Secondary malignant neoplasm of pleura: Secondary | ICD-10-CM | POA: Diagnosis not present

## 2020-11-11 DIAGNOSIS — I82403 Acute embolism and thrombosis of unspecified deep veins of lower extremity, bilateral: Secondary | ICD-10-CM | POA: Insufficient documentation

## 2020-11-11 DIAGNOSIS — Z95828 Presence of other vascular implants and grafts: Secondary | ICD-10-CM

## 2020-11-11 DIAGNOSIS — R0602 Shortness of breath: Secondary | ICD-10-CM | POA: Insufficient documentation

## 2020-11-11 DIAGNOSIS — Z5112 Encounter for antineoplastic immunotherapy: Secondary | ICD-10-CM | POA: Diagnosis present

## 2020-11-11 DIAGNOSIS — R5383 Other fatigue: Secondary | ICD-10-CM | POA: Diagnosis not present

## 2020-11-11 DIAGNOSIS — C3490 Malignant neoplasm of unspecified part of unspecified bronchus or lung: Secondary | ICD-10-CM

## 2020-11-11 DIAGNOSIS — Z79899 Other long term (current) drug therapy: Secondary | ICD-10-CM | POA: Insufficient documentation

## 2020-11-11 DIAGNOSIS — Z5111 Encounter for antineoplastic chemotherapy: Secondary | ICD-10-CM | POA: Diagnosis present

## 2020-11-11 DIAGNOSIS — Z7901 Long term (current) use of anticoagulants: Secondary | ICD-10-CM | POA: Insufficient documentation

## 2020-11-11 DIAGNOSIS — C3411 Malignant neoplasm of upper lobe, right bronchus or lung: Secondary | ICD-10-CM | POA: Insufficient documentation

## 2020-11-11 LAB — CMP (CANCER CENTER ONLY)
ALT: 22 U/L (ref 0–44)
AST: 32 U/L (ref 15–41)
Albumin: 3.1 g/dL — ABNORMAL LOW (ref 3.5–5.0)
Alkaline Phosphatase: 92 U/L (ref 38–126)
Anion gap: 9 (ref 5–15)
BUN: 26 mg/dL — ABNORMAL HIGH (ref 8–23)
CO2: 24 mmol/L (ref 22–32)
Calcium: 8.2 mg/dL — ABNORMAL LOW (ref 8.9–10.3)
Chloride: 104 mmol/L (ref 98–111)
Creatinine: 0.79 mg/dL (ref 0.61–1.24)
GFR, Estimated: >60 mL/min (ref >60)
Glucose, Bld: 109 mg/dL — ABNORMAL HIGH (ref 70–99)
Potassium: 4.5 mmol/L (ref 3.5–5.1)
Sodium: 137 mmol/L (ref 135–145)
Total Bilirubin: 0.6 mg/dL (ref 0.3–1.2)
Total Protein: 6.7 g/dL (ref 6.5–8.1)

## 2020-11-11 LAB — CBC WITH DIFFERENTIAL (CANCER CENTER ONLY)
Abs Immature Granulocytes: 0.01 10*3/uL (ref 0.00–0.07)
Basophils Absolute: 0 10*3/uL (ref 0.0–0.1)
Basophils Relative: 1 %
Eosinophils Absolute: 0 10*3/uL (ref 0.0–0.5)
Eosinophils Relative: 1 %
HCT: 25.7 % — ABNORMAL LOW (ref 39.0–52.0)
Hemoglobin: 8.6 g/dL — ABNORMAL LOW (ref 13.0–17.0)
Immature Granulocytes: 1 %
Lymphocytes Relative: 35 %
Lymphs Abs: 0.6 10*3/uL — ABNORMAL LOW (ref 0.7–4.0)
MCH: 29.4 pg (ref 26.0–34.0)
MCHC: 33.5 g/dL (ref 30.0–36.0)
MCV: 87.7 fL (ref 80.0–100.0)
Monocytes Absolute: 0.2 10*3/uL (ref 0.1–1.0)
Monocytes Relative: 11 %
Neutro Abs: 0.9 10*3/uL — ABNORMAL LOW (ref 1.7–7.7)
Neutrophils Relative %: 51 %
Platelet Count: 63 10*3/uL — ABNORMAL LOW (ref 150–400)
RBC: 2.93 MIL/uL — ABNORMAL LOW (ref 4.22–5.81)
RDW: 19.9 % — ABNORMAL HIGH (ref 11.5–15.5)
WBC Count: 1.7 10*3/uL — ABNORMAL LOW (ref 4.0–10.5)
nRBC: 0 % (ref 0.0–0.2)

## 2020-11-11 LAB — SAMPLE TO BLOOD BANK

## 2020-11-11 MED ORDER — SODIUM CHLORIDE 0.9% FLUSH
10.0000 mL | Freq: Once | INTRAVENOUS | Status: AC
Start: 2020-11-11 — End: 2020-11-11
  Administered 2020-11-11: 10 mL
  Filled 2020-11-11: qty 10

## 2020-11-11 MED ORDER — HEPARIN SOD (PORK) LOCK FLUSH 100 UNIT/ML IV SOLN
500.0000 [IU] | Freq: Once | INTRAVENOUS | Status: AC
  Administered 2020-11-11: 500 [IU]
  Filled 2020-11-11: qty 5

## 2020-11-11 NOTE — Chronic Care Management (AMB) (Signed)
  Chronic Care Management   Note  11/11/2020 Name: Marcus [name] MRN: [id number] DOB: 07/22/1945  Marcus [name] is a 75 y.o. year old male who is a primary care patient of Birdie Riddle, Aundra Millet, MD. I reached out to Marcus [name] by phone today in response to a referral sent by Mr. Tandy Lewin Leavelle's PCP, [name], MD.   [name] was given information about Chronic Care Management services today including:  CCM service includes personalized support from designated clinical staff supervised by his physician, including individualized plan of care and coordination with other care providers 24/7 contact phone numbers for assistance for urgent and routine care needs. Service will only be billed when office clinical staff spend 20 minutes or more in a month to coordinate care. Only one practitioner may furnish and bill the service in a calendar month. The patient may stop CCM services at any time (effective at the end of the month) by phone call to the office staff.   Patient agreed to services and verbal consent obtained.   Follow up plan:   Tatjana Secretary/administrator

## 2020-11-17 ENCOUNTER — Telehealth: Payer: Self-pay

## 2020-11-17 LAB — POCT INR: INR: 2 (ref 2.0–3.0)

## 2020-11-17 NOTE — Telephone Encounter (Signed)
Called and spoke w/pt's wife to let them know that he was overdue for inr self tester and they stated they were on vacation and can get it in tomorrow.

## 2020-11-18 ENCOUNTER — Ambulatory Visit (INDEPENDENT_AMBULATORY_CARE_PROVIDER_SITE_OTHER): Payer: Medicare HMO | Admitting: Cardiology

## 2020-11-18 ENCOUNTER — Inpatient Hospital Stay: Payer: Medicare HMO

## 2020-11-18 ENCOUNTER — Other Ambulatory Visit: Payer: Self-pay

## 2020-11-18 DIAGNOSIS — Z7901 Long term (current) use of anticoagulants: Secondary | ICD-10-CM | POA: Diagnosis not present

## 2020-11-18 DIAGNOSIS — R5383 Other fatigue: Secondary | ICD-10-CM | POA: Diagnosis not present

## 2020-11-18 DIAGNOSIS — I82403 Acute embolism and thrombosis of unspecified deep veins of lower extremity, bilateral: Secondary | ICD-10-CM | POA: Diagnosis not present

## 2020-11-18 DIAGNOSIS — C3411 Malignant neoplasm of upper lobe, right bronchus or lung: Secondary | ICD-10-CM | POA: Diagnosis not present

## 2020-11-18 DIAGNOSIS — Z5112 Encounter for antineoplastic immunotherapy: Secondary | ICD-10-CM | POA: Diagnosis not present

## 2020-11-18 DIAGNOSIS — Z79899 Other long term (current) drug therapy: Secondary | ICD-10-CM | POA: Diagnosis not present

## 2020-11-18 DIAGNOSIS — Z5111 Encounter for antineoplastic chemotherapy: Secondary | ICD-10-CM | POA: Diagnosis not present

## 2020-11-18 DIAGNOSIS — C3490 Malignant neoplasm of unspecified part of unspecified bronchus or lung: Secondary | ICD-10-CM

## 2020-11-18 DIAGNOSIS — Z952 Presence of prosthetic heart valve: Secondary | ICD-10-CM

## 2020-11-18 DIAGNOSIS — C782 Secondary malignant neoplasm of pleura: Secondary | ICD-10-CM | POA: Diagnosis not present

## 2020-11-18 DIAGNOSIS — Z95828 Presence of other vascular implants and grafts: Secondary | ICD-10-CM

## 2020-11-18 DIAGNOSIS — R0602 Shortness of breath: Secondary | ICD-10-CM | POA: Diagnosis not present

## 2020-11-18 DIAGNOSIS — R531 Weakness: Secondary | ICD-10-CM | POA: Diagnosis not present

## 2020-11-18 LAB — CBC WITH DIFFERENTIAL (CANCER CENTER ONLY)
Abs Immature Granulocytes: 0.01 10*3/uL (ref 0.00–0.07)
Basophils Absolute: 0 10*3/uL (ref 0.0–0.1)
Basophils Relative: 0 %
Eosinophils Absolute: 0 10*3/uL (ref 0.0–0.5)
Eosinophils Relative: 0 %
HCT: 23 % — ABNORMAL LOW (ref 39.0–52.0)
Hemoglobin: 7.7 g/dL — ABNORMAL LOW (ref 13.0–17.0)
Immature Granulocytes: 0 %
Lymphocytes Relative: 26 %
Lymphs Abs: 0.8 10*3/uL (ref 0.7–4.0)
MCH: 29.6 pg (ref 26.0–34.0)
MCHC: 33.5 g/dL (ref 30.0–36.0)
MCV: 88.5 fL (ref 80.0–100.0)
Monocytes Absolute: 0.4 10*3/uL (ref 0.1–1.0)
Monocytes Relative: 13 %
Neutro Abs: 1.9 10*3/uL (ref 1.7–7.7)
Neutrophils Relative %: 61 %
Platelet Count: 58 10*3/uL — ABNORMAL LOW (ref 150–400)
RBC: 2.6 MIL/uL — ABNORMAL LOW (ref 4.22–5.81)
RDW: 20.2 % — ABNORMAL HIGH (ref 11.5–15.5)
WBC Count: 3.1 10*3/uL — ABNORMAL LOW (ref 4.0–10.5)
nRBC: 0 % (ref 0.0–0.2)

## 2020-11-18 LAB — CMP (CANCER CENTER ONLY)
ALT: 23 U/L (ref 0–44)
AST: 29 U/L (ref 15–41)
Albumin: 3.1 g/dL — ABNORMAL LOW (ref 3.5–5.0)
Alkaline Phosphatase: 98 U/L (ref 38–126)
Anion gap: 9 (ref 5–15)
BUN: 15 mg/dL (ref 8–23)
CO2: 24 mmol/L (ref 22–32)
Calcium: 8.6 mg/dL — ABNORMAL LOW (ref 8.9–10.3)
Chloride: 105 mmol/L (ref 98–111)
Creatinine: 0.75 mg/dL (ref 0.61–1.24)
GFR, Estimated: >60 mL/min (ref >60)
Glucose, Bld: 78 mg/dL (ref 70–99)
Potassium: 4.3 mmol/L (ref 3.5–5.1)
Sodium: 138 mmol/L (ref 135–145)
Total Bilirubin: 0.5 mg/dL (ref 0.3–1.2)
Total Protein: 6.5 g/dL (ref 6.5–8.1)

## 2020-11-18 LAB — SAMPLE TO BLOOD BANK

## 2020-11-18 MED ORDER — HEPARIN SOD (PORK) LOCK FLUSH 100 UNIT/ML IV SOLN
500.0000 [IU] | Freq: Once | INTRAVENOUS | Status: AC
  Administered 2020-11-18: 500 [IU]
  Filled 2020-11-18: qty 5

## 2020-11-18 MED ORDER — SODIUM CHLORIDE 0.9% FLUSH
10.0000 mL | Freq: Once | INTRAVENOUS | Status: AC
  Administered 2020-11-18: 10 mL
  Filled 2020-11-18: qty 10

## 2020-11-19 ENCOUNTER — Other Ambulatory Visit: Payer: Self-pay | Admitting: Medical Oncology

## 2020-11-19 ENCOUNTER — Telehealth: Payer: Self-pay | Admitting: Medical Oncology

## 2020-11-19 ENCOUNTER — Telehealth: Payer: Self-pay

## 2020-11-19 DIAGNOSIS — D649 Anemia, unspecified: Secondary | ICD-10-CM

## 2020-11-19 LAB — PREPARE RBC (CROSSMATCH)

## 2020-11-19 NOTE — Progress Notes (Signed)
Blood orders released.

## 2020-11-19 NOTE — Telephone Encounter (Signed)
Pt scheduled for 2 units of blood tomorrow.

## 2020-11-19 NOTE — Progress Notes (Signed)
Chronic Care Management Pharmacy Assistant   Name: Marcus [name]  MRN: [id number] DOB: October 26, 1945   Marcus [name] is an 75 y.o. year old male who presents for his initial CCM visit with the clinical pharmacist.   Reason for Encounter: Chart Prep - Initial CCM visit    Recent office visits:  08/13/20 Annye Asa, MD (PCP) - Family Medicine - Physical Exam - Labs were ordered. No medication changes. Follow up in 6 months.   Recent consult visits:  11/04/20 Curt Bears, MD - Oncology - Recurrent Non small cell lung cancer - Oncology treatment performed. No medication changes. Follow up in 3 weeks.   10/14/20 Cassandra Hellingoetter, PA-C - Oncology - Recurrent non Small lung cancer - Oncology treatment performed. CT chest w/o Contrast ordered. Labs ordered. No medication changes. Follow up in 3 weeks.   10/08/20 Corliss Skains, MD - ENT - Cerumen impaction - Cerumen removal performed. No medication changes. Follow up as needed.   10/01/20 Minus Breeding, MD - Cardiology - Long term Drug Therapy - No notes available.  09/23/20 Curt Bears, MD - Oncology - Recurrent Non small cell lung cancer - Oncology treatment performed. No medication changes. Will arrange for the patient to receive 2 units of PRBCs transfusion. Follow up in 3 weeks.   09/15/20 [name], MD - Cardiology - Coronary Artery Disease - EKG performed. Advised to STOP the Aspirin. Follow up in 12 months.   08/30/20 Rexene Edison, NP - Pulmonary - Adenocarcinoma or right lung Stage 3 - Chest xray ordered. No medication changes. Follow up with Dr. Lamonte Sakai in 3  Months   08/25/20 Cassandra Hellingoetter, PA-C - Oncology - Adenocarcinoma of right lung Stage 3 - Oncology treatment performed - Labs ordered. MRI Brain W/O contrast ordered. Prochlorperazine (COMPAZINE) 10 MG tablet and Folic acid (FOLVITE) 1 MG tablet prescribed.  Cyanocobalamin ((VITAMIN B-12)) injection 1,000 mcg administered in office. Follow up in 1 week.    08/10/20 Cassandra Hellingoetter, PA-C - Oncology - Adenocarcinoma or right lung Stage 3 - Oncology treatment performed - PET scan ordered - MethylPREDNISolone (MEDROL DOSEPAK) 4 MG TBPK tablet and HYDROcodone-acetaminophen (NORCO) 5-325 MG tablet prescribed. Follow up as scheduled.  08/09/20 Peter Martinique, MD - Cardiology - Prosthetic heart valve - No notes available.   08/06/20 Kirk Ruths, MD - Cardiology - Long term drug therapy - No notes available  08/02/20 Cassandra Hellingoetter, PA-C - Oncology - Adenocarcinoma of right lung Stage 3 - Oncology treatment performed. US Thoracentesis ordered. Follow up as scheduled.   06/11/20 Janan Ridge, MD - Dermatology - Carcinoma in situ of left ear - No notes available.   06/04/20 [place] - Cardiology - Long term drug therapy - No notes available.   06/03/20 Janet Berlin, PA-C - Specialty not identified - Neoplasm of uncertain behavior of skin - No notes available.    Hospital visits: 09/29/20 - 09/30/20 Medication Reconciliation was completed by comparing discharge summary, patient's EMR and Pharmacy list, and upon discussion with patient.  Admitted to the hospital on 09/29/20 due to Left Inguinal hernia. Discharge date was 09/30/20. Discharged from [place]?Medications Started at [facility] Discharge:?? No new medications noted.   Medication Changes at Hospital Discharge: No medication changes were noted.   Medications Discontinued at Hospital Discharge: No medications were discontinued at discharge.   Medications that remain the same after Hospital Discharge:??  All other medications will remain the same.    Hospital visits:  Medication Reconciliation  was completed by comparing discharge summary, patient's EMR and Pharmacy list, and upon discussion with patient.  Admitted to the hospital on 08/01/20 due to Pleural Effusion on Right. Discharge date was 08/01/20. Discharged from [place]?Medications Started at [facility] Discharge:?? TraMADol (ULTRAM) 50 MG tablet Take 1 tablet (50 mg total) by mouth every 8 (eight) hours for 3 days prescribed.  Medication Changes at Hospital Discharge: No medication changes were noted.   Medications Discontinued at Hospital Discharge: No medications were discontinued at discharge.   Medications that remain the same after Hospital Discharge:??  All other medications will remain the same.    Medications: Outpatient Encounter Medications as of 11/19/2020  Medication Sig Note   ALPRAZolam (XANAX) 0.25 MG tablet Take 1 tablet (0.25 mg total) by mouth every 8 (eight) hours as needed for up to 20 doses for anxiety.    atorvastatin (LIPITOR) 80 MG tablet TAKE 1 TABLET BY MOUTH EVERY DAY (Patient taking differently: Take 80 mg by mouth daily.)    cetirizine (ZYRTEC) 10 MG tablet Take 10 mg by mouth daily.    enoxaparin (LOVENOX) 120 MG/0.8ML injection Inject 0.8 mLs (120 mg total) into the skin 1 tonight and 2 tomorrow AM/PM    folic acid (FOLVITE) 1 MG tablet TAKE 1 TABLET BY MOUTH EVERY DAY    furosemide (LASIX) 20 MG tablet Take 20 mg by mouth as needed for edema.    HYDROcodone-acetaminophen (NORCO/VICODIN) 5-325 MG tablet Take 1 tablet by mouth every 4 (four) hours as needed for moderate pain.    lidocaine-prilocaine (EMLA) cream Apply 1 application topically as needed. 09/23/2020: [place] scheduled for next tuesday   Multiple Vitamins-Minerals (EYE SUPPORT PO) Take 1 tablet by mouth daily. preservision    prochlorperazine (COMPAZINE) 10 MG tablet Take 1 tablet (10 mg total) by mouth every 6 (six) hours as needed.    ramipril (ALTACE) 10 MG capsule TAKE 1 CAPSULE BY MOUTH EVERY DAY (Patient taking differently: Take 10 mg by mouth daily.)    warfarin (COUMADIN) 10 MG tablet TAKE 1 TO 1 AND 1/2 TABLETS DAILY AS DIRECTED BY COUMADIN CLINIC    No facility-administered encounter medications on file as of 11/19/2020.    Have you seen any other  providers since your last visit?  no  Any changes in your medications or health? no  Any side effects from any medications? No  Do you have an symptoms or problems not managed by your medications? No  Any concerns about your health right now? no  Has your provider asked that you check blood pressure, blood sugar, or follow special diet at home? no  Do you get any type of exercise on a regular basis? yes  Can you think of a goal you would like to reach for your health?  Patient reported he would hope to cure his cancer.   Do you have any problems getting your medications? no  Is there anything that you would like to discuss during the appointment?  Patient did not have anything else he would like to discuss at this time.   Star Rating Drugs: Ramipril (ALTACE) 10 MG capsule - last filled 11/05/20 90 days  Atorvastatin (LIPITOR) 80 MG tablet - last filled 10/26/20 90 days   Please bring medications and supplements to appointment   Patient appt with CPP on: 11/24/20 at 11:00am  Future Appointments  Date Time Provider [place]  11/20/2020  8:00 AM [facility] INFUSION [facility] None  11/24/2020 11:00 AM  [facility] C[facility] PHARMACIST [facility] PEC  11/25/2020 10:15 AM [facility] [place] FLUSH [facility] None  11/25/2020 10:45 AM Curt Bears, MD [facility] None  11/25/2020 11:30 AM [facility] INFUSION [facility] None  12/02/2020  2:15 PM [facility] [place] FLUSH [facility] None  12/09/2020  2:15 PM [facility] [place] FLUSH [facility] None  12/15/2020  9:30 AM [facility] [place] FLUSH [facility] None  12/15/2020 10:00 AM [name], [name], PA-C [facility] None  12/15/2020 11:00 AM [facility] INFUSION [facility] None   Jobe Gibbon, CCMA Clinical Pharmacist Assistant  [phone]  Time Spent: 60 minutes

## 2020-11-20 ENCOUNTER — Other Ambulatory Visit: Payer: Self-pay

## 2020-11-20 ENCOUNTER — Inpatient Hospital Stay: Payer: Medicare HMO

## 2020-11-20 DIAGNOSIS — D649 Anemia, unspecified: Secondary | ICD-10-CM

## 2020-11-20 DIAGNOSIS — Z85118 Personal history of other malignant neoplasm of bronchus and lung: Secondary | ICD-10-CM | POA: Diagnosis not present

## 2020-11-20 DIAGNOSIS — J984 Other disorders of lung: Secondary | ICD-10-CM | POA: Diagnosis not present

## 2020-11-20 DIAGNOSIS — I824Y3 Acute embolism and thrombosis of unspecified deep veins of proximal lower extremity, bilateral: Secondary | ICD-10-CM | POA: Diagnosis not present

## 2020-11-20 DIAGNOSIS — I82409 Acute embolism and thrombosis of unspecified deep veins of unspecified lower extremity: Secondary | ICD-10-CM | POA: Diagnosis not present

## 2020-11-20 DIAGNOSIS — I82413 Acute embolism and thrombosis of femoral vein, bilateral: Secondary | ICD-10-CM | POA: Diagnosis not present

## 2020-11-20 MED ORDER — SODIUM CHLORIDE 0.9% IV SOLUTION
250.0000 mL | Freq: Once | INTRAVENOUS | Status: AC
  Administered 2020-11-20: 250 mL via INTRAVENOUS
  Filled 2020-11-20: qty 250

## 2020-11-20 MED ORDER — ACETAMINOPHEN 325 MG PO TABS
ORAL_TABLET | ORAL | Status: AC
  Filled 2020-11-20: qty 2

## 2020-11-20 MED ORDER — DIPHENHYDRAMINE HCL 25 MG PO CAPS
25.0000 mg | ORAL_CAPSULE | Freq: Once | ORAL | Status: AC
Start: 2020-11-20 — End: 2020-11-20
  Administered 2020-11-20: 25 mg via ORAL

## 2020-11-20 MED ORDER — HEPARIN SOD (PORK) LOCK FLUSH 100 UNIT/ML IV SOLN
500.0000 [IU] | Freq: Every day | INTRAVENOUS | Status: AC | PRN
  Administered 2020-11-20: 500 [IU]
  Filled 2020-11-20: qty 5

## 2020-11-20 MED ORDER — ACETAMINOPHEN 325 MG PO TABS
650.0000 mg | ORAL_TABLET | Freq: Once | ORAL | Status: AC
  Administered 2020-11-20: 650 mg via ORAL

## 2020-11-20 MED ORDER — DIPHENHYDRAMINE HCL 25 MG PO CAPS
ORAL_CAPSULE | ORAL | Status: AC
  Filled 2020-11-20: qty 1

## 2020-11-20 MED ORDER — SODIUM CHLORIDE 0.9% FLUSH
10.0000 mL | INTRAVENOUS | Status: AC | PRN
  Administered 2020-11-20: 10 mL
  Filled 2020-11-20: qty 10

## 2020-11-20 NOTE — Patient Instructions (Signed)
Blood Transfusion, Adult, Care After This sheet gives you information about how to care for yourself after your procedure. Your doctor may also give you more specific instructions. If youhave problems or questions, contact your doctor. What can I expect after the procedure? After the procedure, it is common to have: Bruising and soreness at the IV site. A fever or chills on the day of the procedure. This may be your body's response to the new blood cells received. A headache. Follow these instructions at home: Insertion site care     Follow instructions from your doctor about how to take care of your insertion site. This is where an IV tube was put into your vein. Make sure you: Wash your hands with soap and water before and after you change your bandage (dressing). If you cannot use soap and water, use hand sanitizer. Change your bandage as told by your doctor. Check your insertion site every day for signs of infection. Check for: Redness, swelling, or pain. Bleeding from the site. Warmth. Pus or a bad smell. General instructions Take over-the-counter and prescription medicines only as told by your doctor. Rest as told by your doctor. Go back to your normal activities as told by your doctor. Keep all follow-up visits as told by your doctor. This is important. Contact a doctor if: You have itching or red, swollen areas of skin (hives). You feel worried or nervous (anxious). You feel weak after doing your normal activities. You have redness, swelling, warmth, or pain around the insertion site. You have blood coming from the insertion site, and the blood does not stop with pressure. You have pus or a bad smell coming from the insertion site. Get help right away if: You have signs of a serious reaction. This may be coming from an allergy or the body's defense system (immune system). Signs include: Trouble breathing or shortness of breath. Swelling of the face or feeling warm  (flushed). Fever or chills. Head, chest, or back pain. Dark pee (urine) or blood in the pee. Widespread rash. Fast heartbeat. Feeling dizzy or light-headed. You may receive your blood transfusion in an outpatient setting. If so, youwill be told whom to contact to report any reactions. These symptoms may be an emergency. Do not wait to see if the symptoms will go away. Get medical help right away. Call your local emergency services (911 in the [place] Do not drive yourself to the hospital. Summary Bruising and soreness at the IV site are common. Check your insertion site every day for signs of infection. Rest as told by your doctor. Go back to your normal activities as told by your doctor. Get help right away if you have signs of a serious reaction. This information is not intended to replace advice given to you by your health care provider. Make sure you discuss any questions you have with your healthcare provider. Document Revised: 09/19/2018 Document Reviewed: 09/19/2018 Elsevier Patient Education  2022 Elsevier Inc.  

## 2020-11-22 ENCOUNTER — Ambulatory Visit (INDEPENDENT_AMBULATORY_CARE_PROVIDER_SITE_OTHER): Payer: Medicare HMO | Admitting: Internal Medicine

## 2020-11-22 ENCOUNTER — Telehealth: Payer: Self-pay | Admitting: Cardiovascular Disease

## 2020-11-22 DIAGNOSIS — Z79899 Other long term (current) drug therapy: Secondary | ICD-10-CM | POA: Diagnosis not present

## 2020-11-22 DIAGNOSIS — Z952 Presence of prosthetic heart valve: Secondary | ICD-10-CM

## 2020-11-22 DIAGNOSIS — I4891 Unspecified atrial fibrillation: Secondary | ICD-10-CM | POA: Diagnosis not present

## 2020-11-22 DIAGNOSIS — I9789 Other postprocedural complications and disorders of the circulatory system, not elsewhere classified: Secondary | ICD-10-CM | POA: Diagnosis not present

## 2020-11-22 DIAGNOSIS — M7989 Other specified soft tissue disorders: Secondary | ICD-10-CM

## 2020-11-22 LAB — BPAM RBC
Blood Product Expiration Date: 202209142359
Blood Product Expiration Date: 202209142359
ISSUE DATE / TIME: 202208130835
ISSUE DATE / TIME: 202208130835
Unit Type and Rh: 5100
Unit Type and Rh: 5100

## 2020-11-22 LAB — TYPE AND SCREEN
ABO/RH(D): O POS
Antibody Screen: NEGATIVE
Unit division: 0
Unit division: 0

## 2020-11-22 LAB — POCT INR: INR: 4.7 — AB (ref 2.0–3.0)

## 2020-11-22 NOTE — Telephone Encounter (Signed)
Returned call to wife. She report right leg swelling up to pt's knee since Saturday. She state its really tight around the ankle and has never seen it this big before. She denies any discoloration or pain but report it is a little warmer than the left. Wife also denies SOB, weight increase, or diet change.   Wife report pt took 1 dose of PRN lasix yesterday but no changes in swelling.   Will forward to MD to make aware

## 2020-11-22 NOTE — Telephone Encounter (Signed)
Spoke with the patient's wife. She has been made aware of the order for the venous doppler. Appointment made for 8/16 at 1 pm.   She will do an home INR check and call pharmd today with the number.    [name], [name], MD  Meryl Crutch, RN 1 hour ago (10:54 AM)   Please schedule for lower extremity venous Doppler for possible DVT. Can we please check INR same day? - coordinate with Coumadin clinic. Please.

## 2020-11-22 NOTE — Telephone Encounter (Signed)
Pt c/o swelling: STAT is pt has developed SOB within 24 hours  If swelling, where is the swelling located? Right leg   How much weight have you gained and in what time span? No he is a cancer pt   Have you gained 3 pounds in a day or 5 pounds in a week? N/A  Do you have a log of your daily weights (if so, list)? N/A  Are you currently taking a fluid pill? Yes on yesterday   Are you currently SOB? No  Have you traveled recently? No

## 2020-11-23 ENCOUNTER — Encounter (HOSPITAL_COMMUNITY): Payer: Self-pay

## 2020-11-23 ENCOUNTER — Ambulatory Visit (HOSPITAL_BASED_OUTPATIENT_CLINIC_OR_DEPARTMENT_OTHER)
Admission: RE | Admit: 2020-11-23 | Discharge: 2020-11-23 | Disposition: A | Discharge status: Home / Self Care | Payer: Medicare HMO | Source: Ambulatory Visit | Attending: Cardiovascular Disease | Admitting: Cardiovascular Disease

## 2020-11-23 ENCOUNTER — Telehealth: Payer: Self-pay

## 2020-11-23 ENCOUNTER — Other Ambulatory Visit: Payer: Self-pay

## 2020-11-23 ENCOUNTER — Emergency Department (HOSPITAL_COMMUNITY): Payer: Medicare HMO

## 2020-11-23 ENCOUNTER — Encounter: Payer: Self-pay | Admitting: Cardiology

## 2020-11-23 ENCOUNTER — Encounter (HOSPITAL_COMMUNITY): Payer: Self-pay | Admitting: Radiology

## 2020-11-23 ENCOUNTER — Inpatient Hospital Stay (HOSPITAL_COMMUNITY)
Admission: EM | Admit: 2020-11-23 | Discharge: 2020-11-25 | DRG: 300 | Disposition: A | Discharge status: Home / Self Care | Payer: Medicare HMO | Attending: Internal Medicine | Admitting: Internal Medicine

## 2020-11-23 DIAGNOSIS — C3491 Malignant neoplasm of unspecified part of right bronchus or lung: Secondary | ICD-10-CM | POA: Diagnosis not present

## 2020-11-23 DIAGNOSIS — Z20822 Contact with and (suspected) exposure to covid-19: Secondary | ICD-10-CM | POA: Diagnosis not present

## 2020-11-23 DIAGNOSIS — Z9221 Personal history of antineoplastic chemotherapy: Secondary | ICD-10-CM | POA: Diagnosis not present

## 2020-11-23 DIAGNOSIS — I25810 Atherosclerosis of coronary artery bypass graft(s) without angina pectoris: Secondary | ICD-10-CM | POA: Diagnosis not present

## 2020-11-23 DIAGNOSIS — Z880 Allergy status to penicillin: Secondary | ICD-10-CM

## 2020-11-23 DIAGNOSIS — Z951 Presence of aortocoronary bypass graft: Secondary | ICD-10-CM | POA: Diagnosis not present

## 2020-11-23 DIAGNOSIS — Z952 Presence of prosthetic heart valve: Secondary | ICD-10-CM

## 2020-11-23 DIAGNOSIS — J432 Centrilobular emphysema: Secondary | ICD-10-CM | POA: Diagnosis not present

## 2020-11-23 DIAGNOSIS — G4730 Sleep apnea, unspecified: Secondary | ICD-10-CM | POA: Diagnosis not present

## 2020-11-23 DIAGNOSIS — I824Y3 Acute embolism and thrombosis of unspecified deep veins of proximal lower extremity, bilateral: Secondary | ICD-10-CM

## 2020-11-23 DIAGNOSIS — R69 Illness, unspecified: Secondary | ICD-10-CM | POA: Diagnosis not present

## 2020-11-23 DIAGNOSIS — Z888 Allergy status to other drugs, medicaments and biological substances status: Secondary | ICD-10-CM

## 2020-11-23 DIAGNOSIS — I10 Essential (primary) hypertension: Secondary | ICD-10-CM | POA: Diagnosis not present

## 2020-11-23 DIAGNOSIS — I82413 Acute embolism and thrombosis of femoral vein, bilateral: Secondary | ICD-10-CM | POA: Diagnosis not present

## 2020-11-23 DIAGNOSIS — R791 Abnormal coagulation profile: Secondary | ICD-10-CM | POA: Diagnosis not present

## 2020-11-23 DIAGNOSIS — J984 Other disorders of lung: Secondary | ICD-10-CM | POA: Diagnosis not present

## 2020-11-23 DIAGNOSIS — J449 Chronic obstructive pulmonary disease, unspecified: Secondary | ICD-10-CM | POA: Diagnosis not present

## 2020-11-23 DIAGNOSIS — Z7901 Long term (current) use of anticoagulants: Secondary | ICD-10-CM | POA: Diagnosis not present

## 2020-11-23 DIAGNOSIS — Z882 Allergy status to sulfonamides status: Secondary | ICD-10-CM | POA: Diagnosis not present

## 2020-11-23 DIAGNOSIS — I82403 Acute embolism and thrombosis of unspecified deep veins of lower extremity, bilateral: Secondary | ICD-10-CM | POA: Diagnosis not present

## 2020-11-23 DIAGNOSIS — Z85118 Personal history of other malignant neoplasm of bronchus and lung: Secondary | ICD-10-CM | POA: Diagnosis not present

## 2020-11-23 DIAGNOSIS — Z87891 Personal history of nicotine dependence: Secondary | ICD-10-CM | POA: Diagnosis not present

## 2020-11-23 DIAGNOSIS — R040 Epistaxis: Secondary | ICD-10-CM | POA: Diagnosis not present

## 2020-11-23 DIAGNOSIS — M7989 Other specified soft tissue disorders: Secondary | ICD-10-CM | POA: Diagnosis not present

## 2020-11-23 DIAGNOSIS — F419 Anxiety disorder, unspecified: Secondary | ICD-10-CM | POA: Diagnosis present

## 2020-11-23 DIAGNOSIS — I82409 Acute embolism and thrombosis of unspecified deep veins of unspecified lower extremity: Secondary | ICD-10-CM | POA: Diagnosis present

## 2020-11-23 DIAGNOSIS — Z79899 Other long term (current) drug therapy: Secondary | ICD-10-CM

## 2020-11-23 DIAGNOSIS — E890 Postprocedural hypothyroidism: Secondary | ICD-10-CM | POA: Diagnosis not present

## 2020-11-23 DIAGNOSIS — R008 Other abnormalities of heart beat: Secondary | ICD-10-CM | POA: Diagnosis not present

## 2020-11-23 DIAGNOSIS — E7800 Pure hypercholesterolemia, unspecified: Secondary | ICD-10-CM | POA: Diagnosis not present

## 2020-11-23 DIAGNOSIS — I480 Paroxysmal atrial fibrillation: Secondary | ICD-10-CM | POA: Diagnosis present

## 2020-11-23 DIAGNOSIS — I2510 Atherosclerotic heart disease of native coronary artery without angina pectoris: Secondary | ICD-10-CM | POA: Diagnosis present

## 2020-11-23 DIAGNOSIS — G4733 Obstructive sleep apnea (adult) (pediatric): Secondary | ICD-10-CM | POA: Diagnosis present

## 2020-11-23 LAB — CBC WITH DIFFERENTIAL/PLATELET
Abs Immature Granulocytes: 0.03 10*3/uL (ref 0.00–0.07)
Basophils Absolute: 0 10*3/uL (ref 0.0–0.1)
Basophils Relative: 0 %
Eosinophils Absolute: 0 10*3/uL (ref 0.0–0.5)
Eosinophils Relative: 0 %
HCT: 30 % — ABNORMAL LOW (ref 39.0–52.0)
Hemoglobin: 9.7 g/dL — ABNORMAL LOW (ref 13.0–17.0)
Immature Granulocytes: 1 %
Lymphocytes Relative: 17 %
Lymphs Abs: 0.6 10*3/uL — ABNORMAL LOW (ref 0.7–4.0)
MCH: 29.3 pg (ref 26.0–34.0)
MCHC: 32.3 g/dL (ref 30.0–36.0)
MCV: 90.6 fL (ref 80.0–100.0)
Monocytes Absolute: 0.7 10*3/uL (ref 0.1–1.0)
Monocytes Relative: 20 %
Neutro Abs: 2.1 10*3/uL (ref 1.7–7.7)
Neutrophils Relative %: 62 %
Platelets: 122 10*3/uL — ABNORMAL LOW (ref 150–400)
RBC: 3.31 MIL/uL — ABNORMAL LOW (ref 4.22–5.81)
RDW: 19.4 % — ABNORMAL HIGH (ref 11.5–15.5)
WBC: 3.4 10*3/uL — ABNORMAL LOW (ref 4.0–10.5)
nRBC: 0 % (ref 0.0–0.2)

## 2020-11-23 LAB — PROTIME-INR
INR: 2.7 — ABNORMAL HIGH (ref 0.8–1.2)
Prothrombin Time: 28.7 seconds — ABNORMAL HIGH (ref 11.4–15.2)

## 2020-11-23 LAB — COMPREHENSIVE METABOLIC PANEL
ALT: 19 U/L (ref 0–44)
AST: 27 U/L (ref 15–41)
Albumin: 3.2 g/dL — ABNORMAL LOW (ref 3.5–5.0)
Alkaline Phosphatase: 94 U/L (ref 38–126)
Anion gap: 7 (ref 5–15)
BUN: 21 mg/dL (ref 8–23)
CO2: 26 mmol/L (ref 22–32)
Calcium: 8.2 mg/dL — ABNORMAL LOW (ref 8.9–10.3)
Chloride: 103 mmol/L (ref 98–111)
Creatinine, Ser: 0.65 mg/dL (ref 0.61–1.24)
GFR, Estimated: >60 mL/min (ref >60)
Glucose, Bld: 101 mg/dL — ABNORMAL HIGH (ref 70–99)
Potassium: 3.6 mmol/L (ref 3.5–5.1)
Sodium: 136 mmol/L (ref 135–145)
Total Bilirubin: 1 mg/dL (ref 0.3–1.2)
Total Protein: 6.7 g/dL (ref 6.5–8.1)

## 2020-11-23 LAB — RESP PANEL BY RT-PCR (FLU A&B, COVID) ARPGX2
Influenza A by PCR: NEGATIVE
Influenza B by PCR: NEGATIVE
SARS Coronavirus 2 by RT PCR: NEGATIVE

## 2020-11-23 MED ORDER — WARFARIN SODIUM 5 MG PO TABS
10.0000 mg | ORAL_TABLET | Freq: Once | ORAL | Status: AC
  Administered 2020-11-23: 10 mg via ORAL
  Filled 2020-11-23: qty 2

## 2020-11-23 MED ORDER — SODIUM CHLORIDE 0.9% FLUSH
10.0000 mL | INTRAVENOUS | Status: DC | PRN
  Administered 2020-11-25: 10 mL

## 2020-11-23 MED ORDER — CHLORHEXIDINE GLUCONATE CLOTH 2 % EX PADS
6.0000 | MEDICATED_PAD | Freq: Every day | CUTANEOUS | Status: DC
  Administered 2020-11-24 – 2020-11-25 (×2): 6 via TOPICAL

## 2020-11-23 MED ORDER — MORPHINE SULFATE (PF) 2 MG/ML IV SOLN: 2.0000 mg | INTRAVENOUS | Status: DC | PRN

## 2020-11-23 MED ORDER — WARFARIN - PHARMACIST DOSING INPATIENT: Freq: Every day | Status: DC

## 2020-11-23 MED ORDER — SODIUM CHLORIDE 0.45 % IV SOLN: INTRAVENOUS | Status: DC

## 2020-11-23 MED ORDER — HEPARIN (PORCINE) 25000 UT/250ML-% IV SOLN
1450.0000 [IU]/h | INTRAVENOUS | Status: DC
  Administered 2020-11-23: 1300 [IU]/h via INTRAVENOUS
  Filled 2020-11-23 (×2): qty 250

## 2020-11-23 MED ORDER — ONDANSETRON HCL 4 MG PO TABS: 4.0000 mg | ORAL_TABLET | Freq: Four times a day (QID) | ORAL | Status: DC | PRN

## 2020-11-23 MED ORDER — ONDANSETRON HCL 4 MG/2ML IJ SOLN: 4.0000 mg | Freq: Four times a day (QID) | INTRAMUSCULAR | Status: DC | PRN

## 2020-11-23 NOTE — ED Provider Notes (Signed)
Emergency Medicine Provider Triage Evaluation Note  Marcus [name] , a 75 y.o. male  was evaluated in triage.  Patient presents to the emergency department from cardiology office due to right lower extremity DVT and abnormal PT/INR.  Currently on Coumadin.  Receiving chemotherapy for lung cancer.  Does have some intermittent problems with swelling in his legs, however the past few days that seems worse in the right leg and acutely worsened this morning which led to his venous duplex study.  He was sent to the emergency department from cardiology office for admission.  Review of Systems  Positive: Right lower extremity swelling Negative: Chest pain, dyspnea, hemoptysis  Physical Exam   Gen:   Awake, no distress   Resp:  Normal effort  MSK:   Moves extremities without difficulty  Other:  Pitting edema to the right lower leg.  Palpable DP and PT pulse to the right lower extremity.  Medical Decision Making  Medically screening exam initiated at 2:53 PM.  Appropriate orders placed.  Marcus [name] was informed that the remainder of the evaluation will be completed by another provider, this initial triage assessment does not replace that evaluation, and the importance of remaining in the ED until their evaluation is complete.  Per chart review- cardiology note:  "Marcus [name] is a 74 year old male with a history of mechanical aortic valve, on warfarin, CAD status post CABG, ascending aortic aneurysm repair, lung cancer on chemotherapy who presented for a lower extremity duplex today to evaluate for DVT given leg swelling.  Found to have extensive bilateral DVT.  Review of INRs show INR down to 1.7 on 11/03/2020, was 4.7 yesterday.  Discussed with Dr. Sallyanne Kuster.  Recommend admission to [facility].  Can discuss with his oncologist, Marcus [name], alternative anticoagulation (?lovenox) given extensive clotting and monitor INR, which is currently supratherapeutic at 4.7.  Discussed with patient and his wife, they are  agreeable to this, will go to [place]"  DVT.   Marcus Dyke, PA-C 11/23/20 1517    Marcus Biles, MD 11/23/20 1723

## 2020-11-23 NOTE — ED Provider Notes (Signed)
[facility] DEPT Provider Note   CSN: [id number] Arrival date & time: 11/23/20  1441     History Chief Complaint  Patient presents with   Leg Swelling    SARVESH MEDDAUGH is a 75 y.o. male.  HPI JORGELUIS GURGANUS , a 75 y.o. male  was evaluated in triage.  Patient presents to the emergency department from cardiology office due to right lower extremity DVT and abnormal PT/INR.  Currently on Coumadin.  Receiving chemotherapy for lung cancer.  Does have some intermittent problems with swelling in his legs, however the past few days that seems worse in the right leg and acutely worsened this morning which led to his venous duplex study.  He was sent to the emergency department from cardiology office for admission.  Patient denies he is having any chest pain, shortness of breath or cough that is different from what he considers baseline with his known lung cancer.  No fevers.  Patient denies that he has numbness or tingling in the foot.  He does not have pain in the foot.  He reports the pain in the leg is worse when he stands but tolerable at rest.    Past Medical History:  Diagnosis Date   Atrial fibrillation ([place])    Dyspnea    with exertion    Dysrhythmia    Hypertension    Obstructive sleep apnea 10/25/2007   cpap   S/P AVR    2D ECHO, 04/25/2011 - EF >55%, Right ventricle-mild-moderately dilated   Swelling of limb    LEA VENOUS, 08/22/2009 - no evidence of deep vein or superficial thrombosis; partially rupturing Baker's Cyst    Patient Active Problem List   Diagnosis Date Noted   Port-A-Cath in place 10/14/2020   Foot pain, bilateral 10/14/2020   Encounter for antineoplastic immunotherapy 09/23/2020   Antineoplastic chemotherapy induced anemia 09/23/2020   Recurrent non-small cell lung cancer ([place]) 08/25/2020   Goals of care, counseling/discussion 08/25/2020   Anemia due to blood loss 08/13/2020   Coagulopathy ([place]) 08/13/2020   Diarrhea 08/13/2020    Diverticulosis of colon 08/13/2020   Flatulence, eructation and gas pain 08/13/2020   Generalized abdominal pain 08/13/2020   Personal history of colonic polyps 08/13/2020   Pleuritic pain 08/10/2020   Decreased appetite 08/10/2020   Adenocarcinoma of lung, stage 3 ([place]) 01/28/2020   Physical exam 01/28/2020   Adenocarcinoma of right lung, stage 3 ([place]) 01/01/2020   Encounter for antineoplastic chemotherapy 01/01/2020   Screening for malignant neoplasm of prostate 12/31/2019   Hypocalcemia 12/31/2019   Hypomagnesemia 12/31/2019   Male hypogonadism 12/31/2019   Presence of prosthetic heart valve 12/31/2019   Thrombocytopenia ([place]) 12/31/2019   Thyromegaly 12/31/2019   Vitamin D deficiency 12/31/2019   Abdominal hernia 06/16/2019   COPD (chronic obstructive pulmonary disease) ([place]) 06/16/2019   Postoperative atrial fibrillation ([place]) 12/22/2012   Sleep apnea 11/01/2012   Bradycardia, severe sinus 09/09/2012   CAD (coronary artery disease) 09/09/2012   S/P thoracic aortic aneurysm repair 09/09/2012   Hx of CABG 09/09/2012   Hypercholesterolemia 09/09/2012   Hypertension 09/09/2012   Other long term (current) drug therapy 06/28/2012   H/O aortic valve replacement 06/28/2012    Past Surgical History:  Procedure Laterality Date   CARDIAC CATHETERIZATION Bilateral 05/10/2007   Significant 1-vessel disease, severely dilated aortic root with moderate severe aortic insufficiency   CARDIAC SURGERY     CARDIOVERSION  08/02/2007   150 Joule biphasic shock with restoration of sinus rhythm.  Heart rate 60.   cataract surgery      COLONOSCOPY WITH PROPOFOL N/A 12/15/2016   Procedure: COLONOSCOPY WITH PROPOFOL;  Surgeon: Carol Ada, MD;  Location: [facility];  Service: Endoscopy;  Laterality: N/A;   EYE SURGERY     INTERCOSTAL NERVE BLOCK Right 12/08/2019   Procedure: INTERCOSTAL NERVE BLOCK;  Surgeon: [name], MD;  Location: [facility];  Service: Thoracic;  Laterality: Right;    IR IMAGING GUIDED PORT INSERTION  09/28/2020   IR THORACENTESIS ASP PLEURAL SPACE W/IMG GUIDE  08/05/2020   NM MYOVIEW LTD  04/08/2007   No evidence of inducible myocardial ischemia   NODE DISSECTION Right 12/08/2019   Procedure: NODE DISSECTION;  Surgeon: [name], MD;  Location: [facility];  Service: Thoracic;  Laterality: Right;   THYROIDECTOMY, PARTIAL     torn meniscus in right knee surgery          Family History  Problem Relation Age of Onset   Cancer Father     Social History   Tobacco Use   Smoking status: Former    Types: Cigarettes    Quit date: 04/11/1987    Years since quitting: 33.6   Smokeless tobacco: Never  Vaping Use   Vaping Use: Never used  Substance Use Topics   Alcohol use: No   Drug use: No    Home Medications Prior to Admission medications   Medication Sig Start Date End Date Taking? Authorizing Provider  ALPRAZolam (XANAX) 0.25 MG tablet Take 1 tablet (0.25 mg total) by mouth every 8 (eight) hours as needed for up to 20 doses for anxiety. 08/13/20   [name], MD  atorvastatin (LIPITOR) 80 MG tablet TAKE 1 TABLET BY MOUTH EVERY DAY Patient taking differently: Take 80 mg by mouth daily. 07/28/20   Troy Sine, MD  cetirizine (ZYRTEC) 10 MG tablet Take 10 mg by mouth daily.    [provider]  enoxaparin (LOVENOX) 120 MG/0.8ML injection Inject 0.8 mLs (120 mg total) into the skin 1 tonight and 2 tomorrow AM/PM 09/15/20   [name], [name], MD  folic acid (FOLVITE) 1 MG tablet TAKE 1 TABLET BY MOUTH EVERY DAY 10/18/20   [name], [name], PA-C  furosemide (LASIX) 20 MG tablet Take 20 mg by mouth as needed for edema.    [provider]  HYDROcodone-acetaminophen (NORCO/VICODIN) 5-325 MG tablet Take 1 tablet by mouth every 4 (four) hours as needed for moderate pain. 09/15/20   [name], Mihai, MD  lidocaine-prilocaine (EMLA) cream Apply 1 application topically as needed. 09/15/20   [name], [name], PA-C   Multiple Vitamins-Minerals (EYE SUPPORT PO) Take 1 tablet by mouth daily. preservision    [provider]  prochlorperazine (COMPAZINE) 10 MG tablet Take 1 tablet (10 mg total) by mouth every 6 (six) hours as needed. 08/25/20   [name], [name], PA-C  ramipril (ALTACE) 10 MG capsule TAKE 1 CAPSULE BY MOUTH EVERY DAY Patient taking differently: Take 10 mg by mouth daily. 08/10/20   [name], Mihai, MD  warfarin (COUMADIN) 10 MG tablet TAKE 1 TO 1 AND 1/2 TABLETS DAILY AS DIRECTED BY COUMADIN CLINIC 09/15/20   [name], Mihai, MD    Allergies    Amlodipine, Sulfa antibiotics, Tetanus toxoids, Crestor [rosuvastatin], and Penicillins  Review of Systems   Review of Systems 10 systems reviewed negative except as per HPI Physical Exam Updated Vital Signs BP (!) 142/80 (BP Location: Left Arm)   Pulse 78   Temp 97.6 F (36.4  C) (Oral)   Resp 18   SpO2 100%   Physical Exam Constitutional:      Comments: Alert nontoxic no respiratory distress at rest  HENT:     Mouth/Throat:     Pharynx: Oropharynx is clear.  Cardiovascular:     Comments: Occasional ectopy.  2 out of 6 murmur.  Aortic valve click Pulmonary:     Comments: No respiratory distress at rest.  Breath sounds are soft but symmetric. Abdominal:     General: There is no distension.     Palpations: Abdomen is soft.     Tenderness: There is no abdominal tenderness. There is no guarding.  Musculoskeletal:     Comments: 2-3+ swelling of the right lower extremity.  Calf is tender and tense.  Right foot is warm and dry.  2+ edema of the foot but palpable dorsalis pedis pulses strong.  Patient denies any numbness or tingling or significant pain into the foot.  Left calf is soft and pliable    ED Results / Procedures / Treatments   Labs (all labs ordered are listed, but only abnormal results are displayed) Labs Reviewed  RESP PANEL BY RT-PCR (FLU A&B, COVID) [id number]  PROTIME-INR  COMPREHENSIVE METABOLIC PANEL  CBC  WITH DIFFERENTIAL/PLATELET    EKG EKG Interpretation  Date/Time:  Tuesday November 23 2020 16:32:44 EDT Ventricular Rate:  74 PR Interval:  147 QRS Duration: 147 QT Interval:  420 QTC Calculation: 466 R Axis:   76 Text Interpretation: Sinus rhythm Supraventricular bigeminy Nonspecific intraventricular conduction delay Borderline ST depression, anterior leads bigeminy new since previous Confirmed by Wandra Arthurs [phone]) on 11/25/2020 12:01:32 PM  Radiology VAS [place] LOWER EXTREMITY VENOUS (DVT)  Result Date: 11/23/2020  Lower Venous DVT Study Patient Name:  GAYLEN VENNING Lemay  Date of Exam:   11/23/2020 Medical Rec #: [id number]      Accession #:    [id number] Date of Birth: [birth year]       Patient Gender: M Patient Age:   78 years Exam Location:  [facility] Procedure:      VAS [place] LOWER EXTREMITY VENOUS (DVT) Referring Phys: [facility] [name] --------------------------------------------------------------------------------  Indications: Patient presents today with complaints of acute onset of right lower extremity swelling x 3 days, and right groin pain x 1 day. He denies any symptoms involving the left lower extremity. He denies any SOB.  Risk Factors: Cancer of the lung and currently doing chemotherapy since August 2021. Anticoagulation: Coumadin.  Performing Technologist: Sharlett Iles RVT  Examination Guidelines: A complete evaluation includes B-mode imaging, spectral Doppler, color Doppler, and power Doppler as needed of all accessible portions of each vessel. Bilateral testing is considered an integral part of a complete examination. Limited examinations for reoccurring indications may be performed as noted. The reflux portion of the exam is performed with the patient in reverse Trendelenburg.  +---------+---------------+---------+-----------+---------------+--------------+ RIGHT    CompressibilityPhasicitySpontaneityProperties     Thrombus Aging  +---------+---------------+---------+-----------+---------------+--------------+ CFV      None           No       No         softly         Age  echogenic and  Indeterminate                                              brightly                                                                  echogenic                     +---------+---------------+---------+-----------+---------------+--------------+ SFJ      None           No       No         softly         Acute                                                      echogenic                     +---------+---------------+---------+-----------+---------------+--------------+ FV Prox  None           No       No         softly         Age                                                        echogenic and  Indeterminate                                              brightly                      +---------+---------------+---------+-----------+---------------+--------------+ FV Mid   None           No       No         softly         Acute                                                      echogenic                     +---------+---------------+---------+-----------+---------------+--------------+ FV DistalNone           No       No         softly         Acute  echogenic                     +---------+---------------+---------+-----------+---------------+--------------+ PFV      None           No       No         softly         Age                                                        echogenic and  Indeterminate                                              brightly                                                                  echogenic                     +---------+---------------+---------+-----------+---------------+--------------+ POP       None           No       No         softly         Acute                                                      echogenic                     +---------+---------------+---------+-----------+---------------+--------------+ PTV      None           No       No         softly         Acute                                                      echogenic                     +---------+---------------+---------+-----------+---------------+--------------+ PERO     None           No       No         softly         Acute                                                      echogenic                     +---------+---------------+---------+-----------+---------------+--------------+  Soleal   None           No       No         softly         Acute                                                      echogenic                     +---------+---------------+---------+-----------+---------------+--------------+ Gastroc  None           No       No         softly         Acute                                                      echogenic                     +---------+---------------+---------+-----------+---------------+--------------+ GSV      None           No       No         softly         Acute                                                      echogenic                     +---------+---------------+---------+-----------+---------------+--------------+ SSV      None           No       No         softly         Age                                                        echogenic      Indeterminate  +---------+---------------+---------+-----------+---------------+--------------+ SPJ      None           No       No         softly         Acute                                                      echogenic                     +---------+---------------+---------+-----------+---------------+--------------+ TPT       None           No       No  softly         Acute                                                      echogenic                     +---------+---------------+---------+-----------+---------------+--------------+  Right Technical Findings: Acute non-occlusive thrombus in the distal external iliac vein. Acute occlusive thrombus noted in the SFJ, GSV, proximal to distal FV, popliteal vein, gastrocnemius vein, TPT, PTV, popliteal vein and SPJ. Age Indeterminate occlusive thrombus noted in the  CFV, ostial FV, DFV and proximal SSV.  +---------+---------------+---------+-----------+---------------+--------------+ LEFT     CompressibilityPhasicitySpontaneityProperties     Thrombus Aging +---------+---------------+---------+-----------+---------------+--------------+ CFV      Full           Yes      Yes                                      +---------+---------------+---------+-----------+---------------+--------------+ SFJ      Full           Yes      Yes                                      +---------+---------------+---------+-----------+---------------+--------------+ FV Prox  Full           Yes      Yes                                      +---------+---------------+---------+-----------+---------------+--------------+ FV Mid   Full           Yes      Yes                                      +---------+---------------+---------+-----------+---------------+--------------+ FV DistalFull           Yes      Yes                                      +---------+---------------+---------+-----------+---------------+--------------+ PFV      None           No       No         softly         Acute                                                      echogenic                     +---------+---------------+---------+-----------+---------------+--------------+ POP      Full           Yes      Yes                                       +---------+---------------+---------+-----------+---------------+--------------+  PTV      None           No       No         softly         Acute                                                      echogenic                     +---------+---------------+---------+-----------+---------------+--------------+ PERO     None           No       No         softly         Acute                                                      echogenic                     +---------+---------------+---------+-----------+---------------+--------------+ Soleal   None           No       No         softly         Acute                                                      echogenic                     +---------+---------------+---------+-----------+---------------+--------------+ Gastroc  Full           Yes      Yes                                      +---------+---------------+---------+-----------+---------------+--------------+ GSV      None           No       No         softly         Acute                                                      echogenic                     +---------+---------------+---------+-----------+---------------+--------------+ SSV      None           No       No         softly         Acute  echogenic                     +---------+---------------+---------+-----------+---------------+--------------+ TPT      None           No       No         softly         Acute                                                      echogenic                     +---------+---------------+---------+-----------+---------------+--------------+   Left Technical Findings: Acute occlusive thrombus in the proximal GSV, not invading the SFJ (.37 cm from junction). Acute occlusive thrombus in the DFV, TPT, PTV, peroneal veins, SSV and soleal vein.  Findings reported to Dr. Gardiner Rhyme at 2:00 pm.   Summary: RIGHT: - Findings consistent with acute deep vein thrombosis involving the SF junction, right femoral vein, right popliteal vein, right posterior tibial veins, right peroneal veins, right soleal veins, right gastrocnemius veins, and TPT and SPJ. - Findings consistent with acute superficial vein thrombosis involving the right great saphenous vein. - Findings consistent with age indeterminate deep vein thrombosis involving the right common femoral vein, right femoral vein, and right proximal profunda vein. - Findings consistent with age indeterminate superficial vein thrombosis involving the right small saphenous vein. - No cystic structure found in the popliteal fossa. - Findings consistent with acute deep vein thrombosis involving the distal external iliac vein.  LEFT: - Findings consistent with acute deep vein thrombosis involving the left proximal profunda vein, left posterior tibial veins, left peroneal veins, left soleal veins, and TPT. - Findings consistent with acute superficial vein thrombosis involving the left great saphenous vein, and left small saphenous vein. - No cystic structure found in the popliteal fossa.  Incidental findings: Abnormal dilatation of the proximal abdominal aorta, measuring 3.2 cm AP x 3.2 cm TRV. Suggest follow-up for AAA duplex.  *See table(s) above for measurements and observations.    Preliminary     Procedures Procedures   Medications Ordered in ED Medications - No data to display  ED Course  I have reviewed the triage vital signs and the nursing notes.  Pertinent labs & imaging results that were available during my care of the patient were reviewed by me and considered in my medical decision making (see chart for details).    MDM Rules/Calculators/A&P                           Patient presents as outlined with extensive DVT.  He is already anticoagulated on Coumadin.  He is to the low side of his therapeutic range.  Consult reviewed with oncology and  at this time we will proceed with heparin and optimizing Coumadin level.  Plan will be for admission to hospitalist service.  Patient is stable without hypoxia chest pain or shortness of breath.  At this time clinically no signs of acute PE. Final Clinical Impression(s) / ED Diagnoses Final diagnoses:  Acute deep vein thrombosis (DVT) of proximal vein of both lower extremities (HCC)  Severe comorbid illness    Rx / DC Orders ED Discharge Orders     None  Charlesetta Shanks, MD 11/28/20 1002

## 2020-11-23 NOTE — Progress Notes (Signed)
[place] for IV heparin Indication: DVT  Allergies  Allergen Reactions   Amlodipine Anaphylaxis    Weakness and fatigue, also    Sulfa Antibiotics Shortness Of Breath   Tetanus Toxoids Anaphylaxis   Crestor [Rosuvastatin] Other (See Comments)    Myalgia    Penicillins Hives    Has patient had a PCN reaction causing immediate rash, facial/tongue/throat swelling, SOB or lightheadedness with hypotension: No Has patient had a PCN reaction causing severe rash involving mucus membranes or skin necrosis: Yes Has patient had a PCN reaction that required hospitalization: No Has patient had a PCN reaction occurring within the last 10 years: No If all of the above answers are "NO", then may proceed with Cephalosporin use.     Patient Measurements:   Heparin Dosing Weight: TBW (used 11/04/20 weight 75 kg)  Vital Signs: Temp: 97.6 F (36.4 C) (08/16 1518) Temp Source: Oral (08/16 1518) BP: 124/72 (08/16 1800) Pulse Rate: 81 (08/16 1730)  Labs: Recent Labs    11/22/20 1219 11/23/20 1630  HGB  --  9.7*  HCT  --  30.0*  PLT  --  122*  LABPROT  --  28.7*  INR 4.7* 2.7*  CREATININE  --  0.65    CrCl cannot be calculated (Unknown ideal weight.).   Medical History: Past Medical History:  Diagnosis Date   Atrial fibrillation ([place])    Dyspnea    with exertion    Dysrhythmia    Hypertension    Obstructive sleep apnea 10/25/2007   cpap   S/P AVR    2D ECHO, 04/25/2011 - EF >55%, Right ventricle-mild-moderately dilated   Swelling of limb    LEA VENOUS, 08/22/2009 - no evidence of deep vein or superficial thrombosis; partially rupturing Baker's Cyst    Medications:  (Not in a hospital admission)  Scheduled:    Assessment: 43 yoM with PMH mech AVR on warfarin, CAD s/p CABG, AAA s/p repair, active lung cancer on chemo, sent from [facility] office for extensive bilateral DVTs. Pharmacy to dose heparin and warfarin while INR brought to desired  range. (Not considering clot a warfarin failure as pt has had subtherapeutic INRs lately).  Baseline INR therapeutic, aPTT not done Prior anticoagulation: warfarin 10 mg daily, except 15 mg on Wed (per 8/15 [facility] clinic visit), LD 8/15  Significant events:  Today, 11/23/2020: CBC: Hgb & Plt both slightly low; not unexpected with recent chemo on 7/28 INR 8/15 significantly elevated, but decreased rapidly to lower end of therapeutic range w/in 24 hr despite still taking 5 mg that day No bleeding or infusion issues per nursing Major drug interactions: none noted No diet ordered yet SCr WNL  Goal of Therapy: Heparin level 0.3-0.7 units/ml INR 2.5-3.5 Monitor platelets by anticoagulation protocol: Yes  Plan: Heparin 1300 units/hr IV infusion Check heparin level 8 hrs after start Warfarin 10 mg PO tonight at 18:00 - Targeting INR closer to 3.5 so will likely need boosted doses this admission, but will not increase today given rapid changes in INR and now on concomitant heparin gtt Daily INR, CBC; daily heparin level once stable Monitor for signs of bleeding or thrombosis   Reuel Boom, PharmD, BCPS [phone] 11/23/2020, 6:21 PM

## 2020-11-23 NOTE — Progress Notes (Unsigned)
Patient was seen at [facility] office today for a lower extremity venous duplex exam. He is positive for DVT in the bilateral lower extremities and distal portion of the right external iliac vein. Preliminary report can be found under the CV Procedure tab.  Thank you,  Sharlett Iles, RDMS, RVT

## 2020-11-23 NOTE — Progress Notes (Addendum)
Mr. Raczka is a 75 year old male with a history of mechanical aortic valve, on warfarin, CAD status post CABG, ascending aortic aneurysm repair, lung cancer on chemotherapy who presented for a lower extremity duplex today to evaluate for DVT given leg swelling.  Found to have extensive bilateral DVT.  Review of INRs show INR down to 1.7 on 11/03/2020, was 4.7 yesterday.  Discussed with Dr. Sallyanne Kuster.  Recommend admission to [facility].  Can discuss with his oncologist, Dr Julien Nordmann, alternative anticoagulation (?lovenox) given extensive clotting and monitor INR, which is currently supratherapeutic at 4.7.  Discussed with patient and his wife, they are agreeable to this, will go to [facility].

## 2020-11-23 NOTE — H&P (Signed)
History and Physical   Marcus [name] ZOX:[id number] DOB: 10-Dec-1945 DOA: 11/23/2020  Referring MD/NP/PA: Dr. Vallery Ridge  PCP: [name], MD   Outpatient Specialists: Dr. Lorna Few, oncology  Patient coming from: Home  Chief Complaint: Bilateral lower extremity swelling  HPI: Marcus [name] is a 75 y.o. male with medical history significant of patient with history of stage III lung cancer, history of coronary artery disease status post CABG as well as history of aortic valve replacement with mechanical valve on chronic anticoagulation with warfarin, paroxysmal atrial fibrillation, essential hypertension, obstructive sleep apnea, currently receiving chemotherapy for his lung cancer.  He was seen today at the cardiologist office where he was found to have right lower extremity swelling.  He has been on warfarin and INR has been fluctuating.  It was 1.7 apparently last week.  Today however it was 2.5 in the ER.  Was reportedly supratherapeutic at some point at 4.7 yesterday.  He was seen today and lower extremity duplex ultrasound showed extensive bilateral DVT.  Patient sent to the ER where he is now being evaluated.  Oncology is consulted and recommends admission and initiation of heparin drip.  Also continue with warfarin with a goal INR close to 3.5.  Oncology to follow and advise further.  Patient may have failed warfarin altogether and may require switching to alternative treatment..  ED Course: Temperature 97.6, blood pressure 142/80, pulse 81, respirate of 20 oxygen sats 96% room air.  White count 3.4 hemoglobin 9.7, platelets 122.  Sodium 136 potassium 3.6 chloride 103 CO2 26 and creatinine 0.65.  Calcium 8.2 INR 2.7 glucose 109.  Acute respiratory panel including COVID-19 negative.  EKG showed no acute findings.  Chest x-ray showed no significant findings.  Lower extremity Doppler ultrasound showed acute DVT involving this SF junction right femoral vein and right popliteal vein  right posterior tibial veins right peroneal veins and right soleal veins.  Also extensive DVT on the left lower extremity involving most of the lower extremity veins.  Patient at this point will be admitted for further evaluation and treatment.  Review of Systems: As per HPI otherwise 10 point review of systems negative.    Past Medical History:  Diagnosis Date   Atrial fibrillation ([place])    Dyspnea    with exertion    Dysrhythmia    Hypertension    Obstructive sleep apnea 10/25/2007   cpap   S/P AVR    2D ECHO, 04/25/2011 - EF >55%, Right ventricle-mild-moderately dilated   Swelling of limb    LEA VENOUS, 08/22/2009 - no evidence of deep vein or superficial thrombosis; partially rupturing Baker's Cyst    Past Surgical History:  Procedure Laterality Date   CARDIAC CATHETERIZATION Bilateral 05/10/2007   Significant 1-vessel disease, severely dilated aortic root with moderate severe aortic insufficiency   CARDIAC SURGERY     CARDIOVERSION  08/02/2007   150 Joule biphasic shock with restoration of sinus rhythm. Heart rate 60.   cataract surgery      COLONOSCOPY WITH PROPOFOL N/A 12/15/2016   Procedure: COLONOSCOPY WITH PROPOFOL;  Surgeon: Carol Ada, MD;  Location: [facility];  Service: Endoscopy;  Laterality: N/A;   EYE SURGERY     INTERCOSTAL NERVE BLOCK Right 12/08/2019   Procedure: INTERCOSTAL NERVE BLOCK;  Surgeon: [name], MD;  Location: [facility];  Service: Thoracic;  Laterality: Right;   IR IMAGING GUIDED PORT INSERTION  09/28/2020   IR THORACENTESIS ASP PLEURAL SPACE W/IMG GUIDE  08/05/2020  NM MYOVIEW LTD  04/08/2007   No evidence of inducible myocardial ischemia   NODE DISSECTION Right 12/08/2019   Procedure: NODE DISSECTION;  Surgeon: [name], MD;  Location: [facility];  Service: Thoracic;  Laterality: Right;   THYROIDECTOMY, PARTIAL     torn meniscus in right knee surgery        reports that he quit smoking about 33 years ago. His smoking use included  cigarettes. He has never used smokeless tobacco. He reports that he does not drink alcohol and does not use drugs.  Allergies  Allergen Reactions   Amlodipine Anaphylaxis    Weakness and fatigue, also    Sulfa Antibiotics Shortness Of Breath   Tetanus Toxoids Anaphylaxis   Crestor [Rosuvastatin] Other (See Comments)    Myalgia    Penicillins Hives    Has patient had a PCN reaction causing immediate rash, facial/tongue/throat swelling, SOB or lightheadedness with hypotension: No Has patient had a PCN reaction causing severe rash involving mucus membranes or skin necrosis: Yes Has patient had a PCN reaction that required hospitalization: No Has patient had a PCN reaction occurring within the last 10 years: No If all of the above answers are "NO", then may proceed with Cephalosporin use.     Family History  Problem Relation Age of Onset   Cancer Father      Prior to Admission medications   Medication Sig Start Date End Date Taking? Authorizing Provider  ALPRAZolam (XANAX) 0.25 MG tablet Take 1 tablet (0.25 mg total) by mouth every 8 (eight) hours as needed for up to 20 doses for anxiety. 08/13/20   [name], MD  atorvastatin (LIPITOR) 80 MG tablet TAKE 1 TABLET BY MOUTH EVERY DAY Patient taking differently: Take 80 mg by mouth daily. 07/28/20   Troy Sine, MD  cetirizine (ZYRTEC) 10 MG tablet Take 10 mg by mouth daily.    [provider]  enoxaparin (LOVENOX) 120 MG/0.8ML injection Inject 0.8 mLs (120 mg total) into the skin 1 tonight and 2 tomorrow AM/PM 09/15/20   [name], [name], MD  folic acid (FOLVITE) 1 MG tablet TAKE 1 TABLET BY MOUTH EVERY DAY 10/18/20   [name], [name], PA-C  furosemide (LASIX) 20 MG tablet Take 20 mg by mouth as needed for edema.    [provider]  HYDROcodone-acetaminophen (NORCO/VICODIN) 5-325 MG tablet Take 1 tablet by mouth every 4 (four) hours as needed for moderate pain. 09/15/20   [name], Mihai, MD   lidocaine-prilocaine (EMLA) cream Apply 1 application topically as needed. 09/15/20   [name], [name], PA-C  Multiple Vitamins-Minerals (EYE SUPPORT PO) Take 1 tablet by mouth daily. preservision    [provider]  prochlorperazine (COMPAZINE) 10 MG tablet Take 1 tablet (10 mg total) by mouth every 6 (six) hours as needed. 08/25/20   [name], [name], PA-C  ramipril (ALTACE) 10 MG capsule TAKE 1 CAPSULE BY MOUTH EVERY DAY Patient taking differently: Take 10 mg by mouth daily. 08/10/20   [name], Mihai, MD  warfarin (COUMADIN) 10 MG tablet TAKE 1 TO 1 AND 1/2 TABLETS DAILY AS DIRECTED BY COUMADIN CLINIC 09/15/20   [name], MD    Physical Exam: Vitals:   11/23/20 1630 11/23/20 1715 11/23/20 1730 11/23/20 1800  BP: 126/72 119/77 133/75 124/72  Pulse:   81   Resp: (!) 21 20 20  (!) 21  Temp:      TempSrc:      SpO2: 97% 96% 96% 97%  Constitutional: Acutely ill looking no distress. Vitals:   11/23/20 1630 11/23/20 1715 11/23/20 1730 11/23/20 1800  BP: 126/72 119/77 133/75 124/72  Pulse:   81   Resp: (!) 21 20 20  (!) 21  Temp:      TempSrc:      SpO2: 97% 96% 96% 97%   Eyes: PERRL, lids and conjunctivae normal ENMT: Mucous membranes are moist. Posterior pharynx clear of any exudate or lesions.Normal dentition.  Neck: normal, supple, no masses, no thyromegaly Respiratory: Coarse breath sounds bilaterally with some mild basal crackles. Normal respiratory effort. No accessory muscle use.  Cardiovascular: Irregularly irregular with normal rate, no murmurs / rubs / gallops.  1+ lower extremity edema 2+ pedal pulses. No carotid bruits.  Abdomen: no tenderness, no masses palpated. No hepatosplenomegaly. Bowel sounds positive.  Musculoskeletal: no clubbing / cyanosis. No joint deformity upper and lower extremities. Good ROM, no contractures. Normal muscle tone.  Right more than left swollen leg tender Skin: no rashes, lesions, ulcers. No  induration Neurologic: CN 2-12 grossly intact. Sensation intact, DTR normal. Strength 5/5 in all 4.  Psychiatric: Normal judgment and insight. Alert and oriented x 3. Normal mood.     Labs on Admission: I have personally reviewed following labs and imaging studies  CBC: Recent Labs  Lab 11/18/20 1418 11/23/20 1630  WBC 3.1* 3.4*  NEUTROABS 1.9 2.1  HGB 7.7* 9.7*  HCT 23.0* 30.0*  MCV 88.5 90.6  PLT 58* 408*   Basic Metabolic Panel: Recent Labs  Lab 11/18/20 1418 11/23/20 1630  NA 138 136  K 4.3 3.6  CL 105 103  CO2 24 26  GLUCOSE 78 101*  BUN 15 21  CREATININE 0.75 0.65  CALCIUM 8.6* 8.2*   GFR: CrCl cannot be calculated (Unknown ideal weight.). Liver Function Tests: Recent Labs  Lab 11/18/20 1418 11/23/20 1630  AST 29 27  ALT 23 19  ALKPHOS 98 94  BILITOT 0.5 1.0  PROT 6.5 6.7  ALBUMIN 3.1* 3.2*   No results for input(s): LIPASE, AMYLASE in the last 168 hours. No results for input(s): AMMONIA in the last 168 hours. Coagulation Profile: Recent Labs  Lab 11/17/20 1838 11/22/20 1219 11/23/20 1630  INR 2.0 4.7* 2.7*   Cardiac Enzymes: No results for input(s): CKTOTAL, CKMB, CKMBINDEX, TROPONINI in the last 168 hours. BNP (last 3 results) No results for input(s): PROBNP in the last 8760 hours. HbA1C: No results for input(s): HGBA1C in the last 72 hours. CBG: No results for input(s): GLUCAP in the last 168 hours. Lipid Profile: No results for input(s): CHOL, HDL, LDLCALC, TRIG, CHOLHDL, LDLDIRECT in the last 72 hours. Thyroid Function Tests: No results for input(s): TSH, T4TOTAL, FREET4, T3FREE, THYROIDAB in the last 72 hours. Anemia Panel: No results for input(s): VITAMINB12, FOLATE, FERRITIN, TIBC, IRON, RETICCTPCT in the last 72 hours. Urine analysis:    Component Value Date/Time   COLORURINE YELLOW 08/20/2020 0957   APPEARANCEUR CLEAR 08/20/2020 0957   LABSPEC 1.020 08/20/2020 0957   PHURINE 5.5 08/20/2020 0957   GLUCOSEU NEGATIVE  08/20/2020 0957   HGBUR TRACE-INTACT (A) 08/20/2020 0957   BILIRUBINUR NEGATIVE 08/20/2020 0957   BILIRUBINUR negative 08/13/2020 0947   KETONESUR NEGATIVE 08/20/2020 0957   PROTEINUR Positive (A) 08/13/2020 0947   PROTEINUR NEGATIVE 08/01/2020 1134   UROBILINOGEN 0.2 08/20/2020 0957   NITRITE NEGATIVE 08/20/2020 0957   LEUKOCYTESUR NEGATIVE 08/20/2020 0957   Sepsis Labs: @LABRCNTIP (procalcitonin:4,lacticidven:4) ) Recent Results (from the past 240 hour(s))  Resp Panel by RT-PCR (Flu A&B,  Covid) Nasopharyngeal Swab     Status: None   Collection Time: 11/23/20  4:35 PM   Specimen: Nasopharyngeal Swab; Nasopharyngeal(NP) swabs in vial transport medium  Result Value Ref Range Status   SARS Coronavirus 2 by RT PCR NEGATIVE NEGATIVE Final    Comment: (NOTE) SARS-CoV-2 target nucleic acids are NOT DETECTED.  The SARS-CoV-2 RNA is generally detectable in upper respiratory specimens during the acute phase of infection. The lowest concentration of SARS-CoV-2 viral copies this assay can detect is 138 copies/mL. A negative result does not preclude SARS-Cov-2 infection and should not be used as the sole basis for treatment or other patient management decisions. A negative result may occur with  improper specimen collection/handling, submission of specimen other than nasopharyngeal swab, presence of viral mutation(s) within the areas targeted by this assay, and inadequate number of viral copies(<138 copies/mL). A negative result must be combined with clinical observations, patient history, and epidemiological information. The expected result is Negative.  Fact Sheet for Patients:  EntrepreneurPulse.com.au  Fact Sheet for Healthcare Providers:  IncredibleEmployment.be  This test is no t yet approved or cleared by the [place] FDA and  has been authorized for detection and/or diagnosis of SARS-CoV-2 by FDA under an Emergency Use Authorization  (EUA). This EUA will remain  in effect (meaning this test can be used) for the duration of the COVID-19 declaration under Section 564(b)(1) of the Act, 21 U.S.C.section 360bbb-3(b)(1), unless the authorization is terminated  or revoked sooner.       Influenza A by PCR NEGATIVE NEGATIVE Final   Influenza B by PCR NEGATIVE NEGATIVE Final    Comment: (NOTE) The Xpert Xpress SARS-CoV-2/FLU/RSV plus assay is intended as an aid in the diagnosis of influenza from Nasopharyngeal swab specimens and should not be used as a sole basis for treatment. Nasal washings and aspirates are unacceptable for Xpert Xpress SARS-CoV-2/FLU/RSV testing.  Fact Sheet for Patients: EntrepreneurPulse.com.au  Fact Sheet for Healthcare Providers: IncredibleEmployment.be  This test is not yet approved or cleared by the [place] FDA and has been authorized for detection and/or diagnosis of SARS-CoV-2 by FDA under an Emergency Use Authorization (EUA). This EUA will remain in effect (meaning this test can be used) for the duration of the COVID-19 declaration under Section 564(b)(1) of the Act, 21 U.S.C. section 360bbb-3(b)(1), unless the authorization is terminated or revoked.  Performed at [facility], [place] [address], [place],  [address]      Radiological Exams on Admission: DG Chest Port 1 View  Result Date: 11/23/2020 CLINICAL DATA:  History of lung cancer. EXAM: PORTABLE CHEST 1 VIEW COMPARISON:  Chest x-ray 09/07/2020 FINDINGS: The cardiac silhouette, mediastinal and hilar contours are within normal limits and stable. Stable surgical changes from cardiac surgery. Right IJ power port in good position without complicating features. Chronic pulmonary scarring changes.  Chronic right-sided pleural-. IMPRESSION: Chronic lung changes but no acute pulmonary findings. Electronically Signed   By: Marijo Sanes M.D.   On: 11/23/2020 17:24   VAS [place]  LOWER EXTREMITY VENOUS (DVT)  Result Date: 11/23/2020  Lower Venous DVT Study Patient Name:  Marcus [name]  Date of Exam:   11/23/2020 Medical Rec #: [id number]      Accession #:    [id number] Date of Birth: 01/30/1946       Patient Gender: M Patient Age:   72 years Exam Location:  [facility] Procedure:      VAS [place] LOWER EXTREMITY VENOUS (DVT) Referring Phys: [facility] [name] --------------------------------------------------------------------------------  Indications: Patient presents today with complaints of acute onset of right lower extremity swelling x 3 days, and right groin pain x 1 day. He denies any symptoms involving the left lower extremity. He denies any SOB.  Risk Factors: Cancer of the lung and currently doing chemotherapy since August 2021. Anticoagulation: Coumadin.  Performing Technologist: Sharlett Iles RVT  Examination Guidelines: A complete evaluation includes B-mode imaging, spectral Doppler, color Doppler, and power Doppler as needed of all accessible portions of each vessel. Bilateral testing is considered an integral part of a complete examination. Limited examinations for reoccurring indications may be performed as noted. The reflux portion of the exam is performed with the patient in reverse Trendelenburg.  +---------+---------------+---------+-----------+---------------+--------------+ RIGHT    CompressibilityPhasicitySpontaneityProperties     Thrombus Aging +---------+---------------+---------+-----------+---------------+--------------+ CFV      None           No       No         softly         Age                                                        echogenic and  Indeterminate                                              brightly                                                                  echogenic                     +---------+---------------+---------+-----------+---------------+--------------+ SFJ      None           No       No          softly         Acute                                                      echogenic                     +---------+---------------+---------+-----------+---------------+--------------+ FV Prox  None           No       No         softly         Age                                                        echogenic and  Indeterminate  brightly                      +---------+---------------+---------+-----------+---------------+--------------+ FV Mid   None           No       No         softly         Acute                                                      echogenic                     +---------+---------------+---------+-----------+---------------+--------------+ FV DistalNone           No       No         softly         Acute                                                      echogenic                     +---------+---------------+---------+-----------+---------------+--------------+ PFV      None           No       No         softly         Age                                                        echogenic and  Indeterminate                                              brightly                                                                  echogenic                     +---------+---------------+---------+-----------+---------------+--------------+ POP      None           No       No         softly         Acute                                                      echogenic                     +---------+---------------+---------+-----------+---------------+--------------+  PTV      None           No       No         softly         Acute                                                      echogenic                     +---------+---------------+---------+-----------+---------------+--------------+ PERO     None           No       No          softly         Acute                                                      echogenic                     +---------+---------------+---------+-----------+---------------+--------------+ Soleal   None           No       No         softly         Acute                                                      echogenic                     +---------+---------------+---------+-----------+---------------+--------------+ Gastroc  None           No       No         softly         Acute                                                      echogenic                     +---------+---------------+---------+-----------+---------------+--------------+ GSV      None           No       No         softly         Acute                                                      echogenic                     +---------+---------------+---------+-----------+---------------+--------------+ SSV      None  No       No         softly         Age                                                        echogenic      Indeterminate  +---------+---------------+---------+-----------+---------------+--------------+ SPJ      None           No       No         softly         Acute                                                      echogenic                     +---------+---------------+---------+-----------+---------------+--------------+ TPT      None           No       No         softly         Acute                                                      echogenic                     +---------+---------------+---------+-----------+---------------+--------------+  Right Technical Findings: Acute non-occlusive thrombus in the distal external iliac vein. Acute occlusive thrombus noted in the SFJ, GSV, proximal to distal FV, popliteal vein, gastrocnemius vein, TPT, PTV, popliteal vein and SPJ. Age Indeterminate occlusive thrombus noted in the  CFV, ostial  FV, DFV and proximal SSV.  +---------+---------------+---------+-----------+---------------+--------------+ LEFT     CompressibilityPhasicitySpontaneityProperties     Thrombus Aging +---------+---------------+---------+-----------+---------------+--------------+ CFV      Full           Yes      Yes                                      +---------+---------------+---------+-----------+---------------+--------------+ SFJ      Full           Yes      Yes                                      +---------+---------------+---------+-----------+---------------+--------------+ FV Prox  Full           Yes      Yes                                      +---------+---------------+---------+-----------+---------------+--------------+ FV Mid   Full           Yes      Yes                                      +---------+---------------+---------+-----------+---------------+--------------+  FV DistalFull           Yes      Yes                                      +---------+---------------+---------+-----------+---------------+--------------+ PFV      None           No       No         softly         Acute                                                      echogenic                     +---------+---------------+---------+-----------+---------------+--------------+ POP      Full           Yes      Yes                                      +---------+---------------+---------+-----------+---------------+--------------+ PTV      None           No       No         softly         Acute                                                      echogenic                     +---------+---------------+---------+-----------+---------------+--------------+ PERO     None           No       No         softly         Acute                                                      echogenic                      +---------+---------------+---------+-----------+---------------+--------------+ Soleal   None           No       No         softly         Acute                                                      echogenic                     +---------+---------------+---------+-----------+---------------+--------------+ Gastroc  Full  Yes      Yes                                      +---------+---------------+---------+-----------+---------------+--------------+ GSV      None           No       No         softly         Acute                                                      echogenic                     +---------+---------------+---------+-----------+---------------+--------------+ SSV      None           No       No         softly         Acute                                                      echogenic                     +---------+---------------+---------+-----------+---------------+--------------+ TPT      None           No       No         softly         Acute                                                      echogenic                     +---------+---------------+---------+-----------+---------------+--------------+   Left Technical Findings: Acute occlusive thrombus in the proximal GSV, not invading the SFJ (.37 cm from junction). Acute occlusive thrombus in the DFV, TPT, PTV, peroneal veins, SSV and soleal vein.  Findings reported to Dr. Gardiner Rhyme at 2:00 pm.  Summary: RIGHT: - Findings consistent with acute deep vein thrombosis involving the SF junction, right femoral vein, right popliteal vein, right posterior tibial veins, right peroneal veins, right soleal veins, right gastrocnemius veins, and TPT and SPJ. - Findings consistent with acute superficial vein thrombosis involving the right great saphenous vein. - Findings consistent with age indeterminate deep vein thrombosis involving the right common femoral vein, right femoral vein,  and right proximal profunda vein. - Findings consistent with age indeterminate superficial vein thrombosis involving the right small saphenous vein. - No cystic structure found in the popliteal fossa. - Findings consistent with acute deep vein thrombosis involving the distal external iliac vein.  LEFT: - Findings consistent with acute deep vein thrombosis involving the left proximal profunda vein, left posterior tibial veins, left peroneal veins, left soleal veins, and TPT. - Findings consistent with acute superficial vein thrombosis involving the left great saphenous vein, and left  small saphenous vein. - No cystic structure found in the popliteal fossa.  Incidental findings: Abnormal dilatation of the proximal abdominal aorta, measuring 3.2 cm AP x 3.2 cm TRV. Suggest follow-up for AAA duplex.  *See table(s) above for measurements and observations.    Preliminary     EKG: Independently reviewed.  Normal sinus rhythm with no significant ST changes.  Assessment/Plan Principal Problem:   DVT (deep venous thrombosis) (HCC) Active Problems:   H/O aortic valve replacement   CAD (coronary artery disease)   Hx of CABG   Hypercholesterolemia   Hypertension   Sleep apnea   COPD (chronic obstructive pulmonary disease) ([place])   Adenocarcinoma of right lung, stage 3 (HCC)   #1 extensive bilateral DVTs of the lower extremity: Probably secondary to failing Coumadin or recurrent subtherapeutic INRs.  Risk factor most likely is patient's lung cancer.  At this point will initiate heparin drip and follow oncologist recommendation.  Patient will have warfarin continued but higher goals of INR at around 3.5.  Alternatively patient may need to be taken off warfarin altogether and placed on Lovenox.  We will continue with supportive care.  Defer any further decision to oncology.  #2 stage III lung cancer: Patient getting chemotherapy at this point.  Continue per oncology.  #3 coronary artery disease: Status post  coronary artery bypass grafting.  Patient appears stable.  #4 status post aortic valve replacement: Patient will need to be on anticoagulation.  Has mechanical valve.  He will need to continue warfarin or Lovenox.  #5 hyperlipidemia: Patient currently on atorvastatin 80 mg.  We will resume  #6 anxiety disorder: Continue alprazolam.  #7 essential hypertension: Currently on Altace as well as Lasix.  We will continue  #8 obstructive sleep apnea: We will offer CPAP in the hospital.  #9 history of COPD: No acute exacerbation.     DVT prophylaxis: Warfarin and heparin drip Code Status: Full code Family Communication: No family at bedside Disposition Plan: Home Consults called: Dr. Burr Medico, oncology Admission status: Inpatient  Severity of Illness: The appropriate patient status for this patient is INPATIENT. Inpatient status is judged to be reasonable and necessary in order to provide the required intensity of service to ensure the patient's safety. The patient's presenting symptoms, physical exam findings, and initial radiographic and laboratory data in the context of their chronic comorbidities is felt to place them at high risk for further clinical deterioration. Furthermore, it is not anticipated that the patient will be medically stable for discharge from the hospital within 2 midnights of admission. The following factors support the patient status of inpatient.   " The patient's presenting symptoms include lower extremity swelling. " The worrisome physical exam findings include swollen bilateral lower extremity right more than left. " The initial radiographic and laboratory data are worrisome because of evidence of bilateral DVT. " The chronic co-morbidities include history of aortic valve replacement.   * I certify that at the point of admission it is my clinical judgment that the patient will require inpatient hospital care spanning beyond 2 midnights from the point of admission due  to high intensity of service, high risk for further deterioration and high frequency of surveillance required.Barbette Merino MD [facility] Pager [phone]  If 7PM-7AM, please contact night-coverage [email or web address] Password [facility]  11/23/2020, 6:25 PM

## 2020-11-23 NOTE — Telephone Encounter (Signed)
Dr. Sallyanne Kuster LM requesting for Dr. Julien Nordmann to call him regarding this pt. He states the pt has a femoral DVT and would like to dicsuss the management of his chemo and anticoag.

## 2020-11-23 NOTE — ED Triage Notes (Signed)
Pt reports having a large blood clot in right leg. Pt currently takes Coumadin and receiving chemotherapy for lung cancer. Pt was sent by cardiologist for admission.

## 2020-11-24 ENCOUNTER — Other Ambulatory Visit (HOSPITAL_COMMUNITY): Payer: Self-pay

## 2020-11-24 ENCOUNTER — Telehealth: Payer: Medicare HMO

## 2020-11-24 DIAGNOSIS — G4730 Sleep apnea, unspecified: Secondary | ICD-10-CM

## 2020-11-24 DIAGNOSIS — I82403 Acute embolism and thrombosis of unspecified deep veins of lower extremity, bilateral: Secondary | ICD-10-CM

## 2020-11-24 LAB — CBC
HCT: 26.7 % — ABNORMAL LOW (ref 39.0–52.0)
Hemoglobin: 8.7 g/dL — ABNORMAL LOW (ref 13.0–17.0)
MCH: 29.6 pg (ref 26.0–34.0)
MCHC: 32.6 g/dL (ref 30.0–36.0)
MCV: 90.8 fL (ref 80.0–100.0)
Platelets: 116 10*3/uL — ABNORMAL LOW (ref 150–400)
RBC: 2.94 MIL/uL — ABNORMAL LOW (ref 4.22–5.81)
RDW: 19.6 % — ABNORMAL HIGH (ref 11.5–15.5)
WBC: 2.9 10*3/uL — ABNORMAL LOW (ref 4.0–10.5)
nRBC: 0 % (ref 0.0–0.2)

## 2020-11-24 LAB — COMPREHENSIVE METABOLIC PANEL
ALT: 16 U/L (ref 0–44)
AST: 24 U/L (ref 15–41)
Albumin: 2.8 g/dL — ABNORMAL LOW (ref 3.5–5.0)
Alkaline Phosphatase: 83 U/L (ref 38–126)
Anion gap: 9 (ref 5–15)
BUN: 19 mg/dL (ref 8–23)
CO2: 25 mmol/L (ref 22–32)
Calcium: 8 mg/dL — ABNORMAL LOW (ref 8.9–10.3)
Chloride: 103 mmol/L (ref 98–111)
Creatinine, Ser: 0.74 mg/dL (ref 0.61–1.24)
GFR, Estimated: >60 mL/min (ref >60)
Glucose, Bld: 97 mg/dL (ref 70–99)
Potassium: 3.9 mmol/L (ref 3.5–5.1)
Sodium: 137 mmol/L (ref 135–145)
Total Bilirubin: 0.8 mg/dL (ref 0.3–1.2)
Total Protein: 5.9 g/dL — ABNORMAL LOW (ref 6.5–8.1)

## 2020-11-24 LAB — PROTIME-INR
INR: 2.9 — ABNORMAL HIGH (ref 0.8–1.2)
Prothrombin Time: 29.9 seconds — ABNORMAL HIGH (ref 11.4–15.2)

## 2020-11-24 LAB — HEPARIN LEVEL (UNFRACTIONATED): Heparin Unfractionated: 0.26 IU/mL — ABNORMAL LOW (ref 0.30–0.70)

## 2020-11-24 MED ORDER — ALPRAZOLAM 0.25 MG PO TABS: 0.2500 mg | ORAL_TABLET | Freq: Three times a day (TID) | ORAL | Status: DC | PRN

## 2020-11-24 MED ORDER — ENOXAPARIN SODIUM 80 MG/0.8ML IJ SOSY
80.0000 mg | PREFILLED_SYRINGE | Freq: Two times a day (BID) | INTRAMUSCULAR | Status: DC
  Administered 2020-11-24 – 2020-11-25 (×2): 80 mg via SUBCUTANEOUS
  Filled 2020-11-24 (×3): qty 0.8

## 2020-11-24 MED ORDER — SIMETHICONE 80 MG PO CHEW
80.0000 mg | CHEWABLE_TABLET | Freq: Four times a day (QID) | ORAL | Status: DC | PRN
  Administered 2020-11-24: 80 mg via ORAL
  Filled 2020-11-24: qty 1

## 2020-11-24 MED ORDER — ATORVASTATIN CALCIUM 40 MG PO TABS
80.0000 mg | ORAL_TABLET | Freq: Every day | ORAL | Status: DC
  Administered 2020-11-24: 80 mg via ORAL
  Filled 2020-11-24: qty 2

## 2020-11-24 MED ORDER — ACETAMINOPHEN 325 MG PO TABS
650.0000 mg | ORAL_TABLET | Freq: Four times a day (QID) | ORAL | Status: DC | PRN
  Administered 2020-11-24: 650 mg via ORAL
  Filled 2020-11-24: qty 2

## 2020-11-24 MED ORDER — RAMIPRIL 10 MG PO CAPS
10.0000 mg | ORAL_CAPSULE | Freq: Every day | ORAL | Status: DC
  Administered 2020-11-24 – 2020-11-25 (×2): 10 mg via ORAL
  Filled 2020-11-24 (×2): qty 1

## 2020-11-24 MED ORDER — ENSURE MAX PROTEIN PO LIQD
11.0000 [oz_av] | Freq: Every day | ORAL | Status: DC
  Administered 2020-11-24 – 2020-11-25 (×2): 11 [oz_av] via ORAL
  Filled 2020-11-24 (×2): qty 330

## 2020-11-24 MED ORDER — WARFARIN SODIUM 5 MG PO TABS: 10.0000 mg | ORAL_TABLET | Freq: Once | ORAL | Status: DC

## 2020-11-24 MED ORDER — TRAMADOL HCL 50 MG PO TABS: 50.0000 mg | ORAL_TABLET | Freq: Four times a day (QID) | ORAL | Status: DC | PRN

## 2020-11-24 NOTE — Progress Notes (Signed)
[place] for Lovenox Indication: DVT/mech AVR/PAF  Allergies  Allergen Reactions   Amlodipine Anaphylaxis    Weakness and fatigue, also    Sulfa Antibiotics Shortness Of Breath   Tetanus Toxoids Anaphylaxis   Crestor [Rosuvastatin] Other (See Comments)    Myalgia    Penicillins Hives    Has patient had a PCN reaction causing immediate rash, facial/tongue/throat swelling, SOB or lightheadedness with hypotension: No Has patient had a PCN reaction causing severe rash involving mucus membranes or skin necrosis: Yes Has patient had a PCN reaction that required hospitalization: No Has patient had a PCN reaction occurring within the last 10 years: No If all of the above answers are "NO", then may proceed with Cephalosporin use.     Patient Measurements: Height: 5\' 9"  (175.3 cm) IBW/kg (Calculated) : 70.7 Heparin Dosing Weight: TBW (used 11/04/20 weight 75 kg)  Vital Signs: Temp: 98.7 F (37.1 C) (08/17 1319) Temp Source: Oral (08/17 1319) BP: 125/69 (08/17 1319) Pulse Rate: 74 (08/17 1319)  Labs: Recent Labs    11/22/20 1219 11/23/20 1630 11/24/20 0455  HGB  --  9.7* 8.7*  HCT  --  30.0* 26.7*  PLT  --  122* 116*  LABPROT  --  28.7* 29.9*  INR 4.7* 2.7* 2.9*  HEPARINUNFRC  --   --  0.26*  CREATININE  --  0.65 0.74     CrCl cannot be calculated (Unknown ideal weight.).   Medical History: Past Medical History:  Diagnosis Date   Atrial fibrillation ([place])    Dyspnea    with exertion    Dysrhythmia    Hypertension    Obstructive sleep apnea 10/25/2007   cpap   S/P AVR    2D ECHO, 04/25/2011 - EF >55%, Right ventricle-mild-moderately dilated   Swelling of limb    LEA VENOUS, 08/22/2009 - no evidence of deep vein or superficial thrombosis; partially rupturing Baker's Cyst    Medications:  Medications Prior to Admission  Medication Sig Dispense Refill Last Dose   acetaminophen (TYLENOL) 500 MG tablet Take 500 mg by mouth  every 6 (six) hours as needed for mild pain or headache.   unk   ALPRAZolam (XANAX) 0.25 MG tablet Take 1 tablet (0.25 mg total) by mouth every 8 (eight) hours as needed for up to 20 doses for anxiety. 20 tablet 0 unk   atorvastatin (LIPITOR) 80 MG tablet TAKE 1 TABLET BY MOUTH EVERY DAY (Patient taking differently: Take 80 mg by mouth at bedtime.) 90 tablet 2 11/22/2020 at pm   cetirizine (ZYRTEC) 10 MG tablet Take 10 mg by mouth at bedtime.   11/23/2020 at pm   Coenzyme Q10-Vitamin E (QUNOL ULTRA COQ10 PO) Take 1 capsule by mouth daily with breakfast.   11/23/2020 at am   dexamethasone (DECADRON) 4 MG tablet Take 4 mg by mouth See admin instructions. Take 4 mg by mouth two times a day the day before, day of, and day after chemotherapy   7/86/7672   folic acid (FOLVITE) 1 MG tablet TAKE 1 TABLET BY MOUTH EVERY DAY (Patient taking differently: Take 1 mg by mouth daily with breakfast.) 30 tablet 2 11/23/2020 at am   furosemide (LASIX) 20 MG tablet Take 20 mg by mouth as needed for edema.   unk   lidocaine-prilocaine (EMLA) cream Apply 1 application topically as needed. (Patient taking differently: Apply 1 application topically as needed (as directed).) 30 g 2 unk   Multiple Vitamin (ONE-A-DAY 55 PLUS PO) Take  1 tablet by mouth daily with breakfast.   11/23/2020 at am   Multiple Vitamins-Minerals (PRESERVISION AREDS 2) CAPS Take 1 capsule by mouth daily with breakfast.   11/23/2020 at am   prochlorperazine (COMPAZINE) 10 MG tablet Take 1 tablet (10 mg total) by mouth every 6 (six) hours as needed. (Patient taking differently: Take 10 mg by mouth every 6 (six) hours as needed for nausea or vomiting.) 30 tablet 2 unk   ramipril (ALTACE) 10 MG capsule TAKE 1 CAPSULE BY MOUTH EVERY DAY (Patient taking differently: Take 10 mg by mouth daily.) 90 capsule 3 11/23/2020   warfarin (COUMADIN) 10 MG tablet TAKE 1 TO 1 AND 1/2 TABLETS DAILY AS DIRECTED BY COUMADIN CLINIC (Patient taking differently: Take 5-15 mg by mouth  See admin instructions. Take 10 mg by mouth at bedtime on Sun/Tues/Thurs/Fri/Sat, 15 mg on Wednesdays, and 5 mg on Mondays) 135 tablet 1 11/22/2020 at 2230   enoxaparin (LOVENOX) 120 MG/0.8ML injection Inject 0.8 mLs (120 mg total) into the skin 1 tonight and 2 tomorrow AM/PM (Patient not taking: Reported on 11/23/2020) 2.4 mL 0 Not Taking   HYDROcodone-acetaminophen (NORCO/VICODIN) 5-325 MG tablet Take 1 tablet by mouth every 4 (four) hours as needed for moderate pain. (Patient not taking: No sig reported) 45 tablet 0 Not Taking   Scheduled:   Chlorhexidine Gluconate Cloth  6 each Topical Daily   enoxaparin (LOVENOX) injection  80 mg Subcutaneous BID   Ensure Max Protein  11 oz Oral Daily   warfarin  10 mg Oral ONCE-1600   Warfarin - Pharmacist Dosing Inpatient   Does not apply q1600    Assessment: 24 yoM with PMH mech AVR on warfarin, CAD s/p CABG, AAA s/p repair, active lung cancer on chemo, sent from [facility] office for extensive bilateral DVTs. Initially started on heparin infusion until INR closer to 3.5. Plan now to transition to Lovenox.   Significant events:  Today, 11/24/2020: CBC: Hgb (8.7) & Plt (116) both slightly low; recent chemo on 7/28 INR = 2.9  SCr 0.74 Weight 75 kg on 7/28  Goal of Therapy: 4h Anti-Xa 0.6-1  Plan: Height and weight ordered Transition to Lovenox 80 mg (~ 1mg /kg) bid  Stop heparin upon initial injection Plan meds to bed from [facility] [place]   [name] D  11/24/2020, 1:57 PM

## 2020-11-24 NOTE — Progress Notes (Signed)
PROGRESS NOTE    Marcus [name]  ZDG:[id number] DOB: 1945-12-12 DOA: 11/23/2020 PCP: [name], MD    Brief Narrative:  Marcus [name] is a 75 year old male with past medical history significant for stage IV lung cancer on chemotherapy, CAD s/p CABG, mechanical aortic valve repair on Coumadin, paroxysmal atrial fibrillation, essential hypertension, OSA who presented to [facility] ED on 8/16 from his cardiologist office where he was found to have right lower extremity swelling.  Patient on Coumadin outpatient with fluctuating INR.  At the cardiologist office, bilateral lower extremity duplex ultrasound showed extensive bilateral DVT.  In the ED, temperature 97.6 F, BP 142/80, HR 81, RR 20, SPO2 96% on room air.  WBC count 3.4, hemoglobin 9.7, platelets 122.  Sodium 136, potassium 3.6, chloride 103, CO2 26, creatinine 0.65.  INR 2.7.  Glucose 109.  COVID-19 PCR negative.  Influenza A/B PCR negative.  EKG with no ischemic findings.  Lower extremity Doppler ultrasound with acute DVT involving SF junction, right femoral vein, right popliteal vein, right posterior tibial veins, right peroneal veins and right soleal vein.  Also extensive DVT on the left lower extremity involving most of the left lower extremity veins.  TRH was consulted for further evaluation and management of acute DVT in the setting of presumed failed warfarin anticoagulation.   Assessment & Plan:   Principal Problem:   DVT (deep venous thrombosis) (HCC) Active Problems:   H/O aortic valve replacement   CAD (coronary artery disease)   Hx of CABG   Hypercholesterolemia   Hypertension   Sleep apnea   COPD (chronic obstructive pulmonary disease) ([place])   Adenocarcinoma of right lung, stage 3 (HCC)   Extensive bilateral DVTs lower extremity Patient presents from cardiology office with right lower extremity swelling.  Vascular duplex ultrasound bilateral lower extremities remarkable for extensive DVT.  Patient with hypercoagulable  state given his lung cancer.  Patient on Coumadin for proximal atrial fibrillation and mechanical aortic valve.  INR has been fluctuating, but INR on arrival 2.7.  Patient was initially started on heparin drip.  Discussed with his primary oncologist, Dr. Earlie Server who discussed with his cardiologist Dr. Sallyanne Kuster as well as consultation with cardiology, Dr. Marlou Porch who recommends transition to Lovenox and discontinuation of warfarin. --Transition heparin drip to Lovenox, pharmacy consulted for assistance --Discontinue warfarin --Likely will need to continue Lovenox during the duration of the DVT treatment with possible transition back to Coumadin after that time per cardiology  CAD s/p CABG Stable no anginal symptoms.  Continue statin.  Mechanical aortic valve replacement Was prior on warfarin.  Changing to Lovenox as above.  Continue outpatient follow-up with cardiology.  Stage III lung cancer Currently on chemotherapy.  Discussed with patient's primary oncologist, Dr. Julien Nordmann this morning regarding transition to Lovenox, which she is in agreement with.  Continue outpatient follow-up with oncology as scheduled.  Hyperlipidemia: Continue atorvastatin 80 mg p.o. daily  Essential hypertension Continue ramipril 10 mg p.o. daily  Anxiety: Continue alprazolam  OSA: Continue nocturnal CPAP  Hx COPD, not in exacerbation --Oxygen well on room air  DVT prophylaxis:  warfarin (COUMADIN) tablet 10 mg    Code Status: Full Code Family Communication: Updated spouse present at bedside this morning as well as this afternoon  Disposition Plan:  Level of care: Telemetry Status is: Inpatient  Remains inpatient appropriate because:IV treatments appropriate due to intensity of illness or inability to take PO and Inpatient level of care appropriate due to severity of illness  Dispo: The patient  is from: Home              Anticipated d/c is to: Home              Patient currently is not medically stable  to d/c.   Difficult to place patient No  Consultants:  Cardiology, Dr. Marlou Porch Medical oncology, Dr. Earlie Server; discussed case on 8/17  Procedures:  None  Antimicrobials:  None   Subjective: Patient seen examined bedside, resting comfortably.  Continues with mild right lower extremity discomfort due to swelling.  Spouse present at bedside.  Discussed with Dr. Julien Nordmann oncology this morning who discussed with his primary cardiologist Dr. Sallyanne Kuster and agrees with transition of warfarin to Lovenox; also was evaluated by cardiology Dr. Marlou Porch inpatient with similar agreement.  Discussed with patient and spouse at bedside, transitioning heparin drip to Lovenox.  They have done Lovenox injections in the past and feel comfortable with this.  No other complaints or concerns at this time.  Denies headache, no visual changes, no dizziness, no chest pain, no palpitations, no shortness of breath, no abdominal pain.  No acute events overnight per nursing staff.  Objective: Vitals:   11/23/20 2348 11/24/20 0313 11/24/20 0630 11/24/20 1319  BP: 122/74 119/72 118/77 125/69  Pulse: 87 71 (!) 45 74  Resp: 18 18 15    Temp: 98.2 F (36.8 C) 98.1 F (36.7 C) 98.1 F (36.7 C) 98.7 F (37.1 C)  TempSrc: Oral Oral Oral Oral  SpO2: 97% 99% 98% 97%  Height:        Intake/Output Summary (Last 24 hours) at 11/24/2020 1354 Last data filed at 11/24/2020 1100 Gross per 24 hour  Intake 638.71 ml  Output 200 ml  Net 438.71 ml   There were no vitals filed for this visit.  Examination:  General exam: Appears calm and comfortable  Respiratory system: Clear to auscultation. Respiratory effort normal.  On room air Cardiovascular system: S1 & S2 heard, mechanical click noted right upper sternal border, RRR. No JVD, murmurs, rubs, gallops.  Asymmetric lower extremity edema, right greater than left Gastrointestinal system: Abdomen is nondistended, soft and nontender. No organomegaly or masses felt. Normal bowel  sounds heard. Central nervous system: Alert and oriented. No focal neurological deficits. Extremities: Symmetric 5 x 5 power. Skin: No rashes, lesions or ulcers Psychiatry: Judgement and insight appear normal. Mood & affect appropriate.     Data Reviewed: I have personally reviewed following labs and imaging studies  CBC: Recent Labs  Lab 11/18/20 1418 11/23/20 1630 11/24/20 0455  WBC 3.1* 3.4* 2.9*  NEUTROABS 1.9 2.1  --   HGB 7.7* 9.7* 8.7*  HCT 23.0* 30.0* 26.7*  MCV 88.5 90.6 90.8  PLT 58* 122* 527*   Basic Metabolic Panel: Recent Labs  Lab 11/18/20 1418 11/23/20 1630 11/24/20 0455  NA 138 136 137  K 4.3 3.6 3.9  CL 105 103 103  CO2 24 26 25   GLUCOSE 78 101* 97  BUN 15 21 19   CREATININE 0.75 0.65 0.74  CALCIUM 8.6* 8.2* 8.0*   GFR: CrCl cannot be calculated (Unknown ideal weight.). Liver Function Tests: Recent Labs  Lab 11/18/20 1418 11/23/20 1630 11/24/20 0455  AST 29 27 24   ALT 23 19 16   ALKPHOS 98 94 83  BILITOT 0.5 1.0 0.8  PROT 6.5 6.7 5.9*  ALBUMIN 3.1* 3.2* 2.8*   No results for input(s): LIPASE, AMYLASE in the last 168 hours. No results for input(s): AMMONIA in the last 168 hours. Coagulation Profile: Recent  Labs  Lab 11/17/20 1838 11/22/20 1219 11/23/20 1630 11/24/20 0455  INR 2.0 4.7* 2.7* 2.9*   Cardiac Enzymes: No results for input(s): CKTOTAL, CKMB, CKMBINDEX, TROPONINI in the last 168 hours. BNP (last 3 results) No results for input(s): PROBNP in the last 8760 hours. HbA1C: No results for input(s): HGBA1C in the last 72 hours. CBG: No results for input(s): GLUCAP in the last 168 hours. Lipid Profile: No results for input(s): CHOL, HDL, LDLCALC, TRIG, CHOLHDL, LDLDIRECT in the last 72 hours. Thyroid Function Tests: No results for input(s): TSH, T4TOTAL, FREET4, T3FREE, THYROIDAB in the last 72 hours. Anemia Panel: No results for input(s): VITAMINB12, FOLATE, FERRITIN, TIBC, IRON, RETICCTPCT in the last 72 hours. Sepsis  Labs: No results for input(s): PROCALCITON, LATICACIDVEN in the last 168 hours.  Recent Results (from the past 240 hour(s))  Resp Panel by RT-PCR (Flu A&B, Covid) Nasopharyngeal Swab     Status: None   Collection Time: 11/23/20  4:35 PM   Specimen: Nasopharyngeal Swab; Nasopharyngeal(NP) swabs in vial transport medium  Result Value Ref Range Status   SARS Coronavirus 2 by RT PCR NEGATIVE NEGATIVE Final    Comment: (NOTE) SARS-CoV-2 target nucleic acids are NOT DETECTED.  The SARS-CoV-2 RNA is generally detectable in upper respiratory specimens during the acute phase of infection. The lowest concentration of SARS-CoV-2 viral copies this assay can detect is 138 copies/mL. A negative result does not preclude SARS-Cov-2 infection and should not be used as the sole basis for treatment or other patient management decisions. A negative result may occur with  improper specimen collection/handling, submission of specimen other than nasopharyngeal swab, presence of viral mutation(s) within the areas targeted by this assay, and inadequate number of viral copies(<138 copies/mL). A negative result must be combined with clinical observations, patient history, and epidemiological information. The expected result is Negative.  Fact Sheet for Patients:  EntrepreneurPulse.com.au  Fact Sheet for Healthcare Providers:  IncredibleEmployment.be  This test is no t yet approved or cleared by the [place] FDA and  has been authorized for detection and/or diagnosis of SARS-CoV-2 by FDA under an Emergency Use Authorization (EUA). This EUA will remain  in effect (meaning this test can be used) for the duration of the COVID-19 declaration under Section 564(b)(1) of the Act, 21 U.S.C.section 360bbb-3(b)(1), unless the authorization is terminated  or revoked sooner.       Influenza A by PCR NEGATIVE NEGATIVE Final   Influenza B by PCR NEGATIVE NEGATIVE Final     Comment: (NOTE) The Xpert Xpress SARS-CoV-2/FLU/RSV plus assay is intended as an aid in the diagnosis of influenza from Nasopharyngeal swab specimens and should not be used as a sole basis for treatment. Nasal washings and aspirates are unacceptable for Xpert Xpress SARS-CoV-2/FLU/RSV testing.  Fact Sheet for Patients: EntrepreneurPulse.com.au  Fact Sheet for Healthcare Providers: IncredibleEmployment.be  This test is not yet approved or cleared by the [place] FDA and has been authorized for detection and/or diagnosis of SARS-CoV-2 by FDA under an Emergency Use Authorization (EUA). This EUA will remain in effect (meaning this test can be used) for the duration of the COVID-19 declaration under Section 564(b)(1) of the Act, 21 U.S.C. section 360bbb-3(b)(1), unless the authorization is terminated or revoked.  Performed at [facility], [place] [address] [place], [place] [address]          Radiology Studies: [facility] Chest Port 1 View  Result Date: 11/23/2020 CLINICAL DATA:  History of lung cancer. EXAM: PORTABLE CHEST 1 VIEW  COMPARISON:  Chest x-ray 09/07/2020 FINDINGS: The cardiac silhouette, mediastinal and hilar contours are within normal limits and stable. Stable surgical changes from cardiac surgery. Right IJ power port in good position without complicating features. Chronic pulmonary scarring changes.  Chronic right-sided pleural-. IMPRESSION: Chronic lung changes but no acute pulmonary findings. Electronically Signed   By: Marijo Sanes M.D.   On: 11/23/2020 17:24   VAS [place] LOWER EXTREMITY VENOUS (DVT)  Result Date: 11/23/2020  Lower Venous DVT Study Patient Name:  DALLYN BERGLAND Braaksma  Date of Exam:   11/23/2020 Medical Rec #: [id number]      Accession #:    [id number] Date of Birth: 1945-08-18       Patient Gender: M Patient Age:   72 years Exam Location:  [facility] Procedure:      VAS [place] LOWER EXTREMITY VENOUS (DVT) Referring Phys:  [facility] [name] --------------------------------------------------------------------------------  Indications: Patient presents today with complaints of acute onset of right lower extremity swelling x 3 days, and right groin pain x 1 day. He denies any symptoms involving the left lower extremity. He denies any SOB.  Risk Factors: Cancer of the lung and currently doing chemotherapy since August 2021. Anticoagulation: Coumadin.  Performing Technologist: Sharlett Iles RVT  Examination Guidelines: A complete evaluation includes B-mode imaging, spectral Doppler, color Doppler, and power Doppler as needed of all accessible portions of each vessel. Bilateral testing is considered an integral part of a complete examination. Limited examinations for reoccurring indications may be performed as noted. The reflux portion of the exam is performed with the patient in reverse Trendelenburg.  +---------+---------------+---------+-----------+---------------+--------------+ RIGHT    CompressibilityPhasicitySpontaneityProperties     Thrombus Aging +---------+---------------+---------+-----------+---------------+--------------+ CFV      None           No       No         softly         Age                                                        echogenic and  Indeterminate                                              brightly                                                                  echogenic                     +---------+---------------+---------+-----------+---------------+--------------+ SFJ      None           No       No         softly         Acute  echogenic                     +---------+---------------+---------+-----------+---------------+--------------+ FV Prox  None           No       No         softly         Age                                                        echogenic and  Indeterminate                                               brightly                      +---------+---------------+---------+-----------+---------------+--------------+ FV Mid   None           No       No         softly         Acute                                                      echogenic                     +---------+---------------+---------+-----------+---------------+--------------+ FV DistalNone           No       No         softly         Acute                                                      echogenic                     +---------+---------------+---------+-----------+---------------+--------------+ PFV      None           No       No         softly         Age                                                        echogenic and  Indeterminate                                              brightly  echogenic                     +---------+---------------+---------+-----------+---------------+--------------+ POP      None           No       No         softly         Acute                                                      echogenic                     +---------+---------------+---------+-----------+---------------+--------------+ PTV      None           No       No         softly         Acute                                                      echogenic                     +---------+---------------+---------+-----------+---------------+--------------+ PERO     None           No       No         softly         Acute                                                      echogenic                     +---------+---------------+---------+-----------+---------------+--------------+ Soleal   None           No       No         softly         Acute                                                      echogenic                      +---------+---------------+---------+-----------+---------------+--------------+ Gastroc  None           No       No         softly         Acute                                                      echogenic                     +---------+---------------+---------+-----------+---------------+--------------+  GSV      None           No       No         softly         Acute                                                      echogenic                     +---------+---------------+---------+-----------+---------------+--------------+ SSV      None           No       No         softly         Age                                                        echogenic      Indeterminate  +---------+---------------+---------+-----------+---------------+--------------+ SPJ      None           No       No         softly         Acute                                                      echogenic                     +---------+---------------+---------+-----------+---------------+--------------+ TPT      None           No       No         softly         Acute                                                      echogenic                     +---------+---------------+---------+-----------+---------------+--------------+   Right Technical Findings: Acute non-occlusive thrombus in the distal external iliac vein. Acute occlusive thrombus noted in the SFJ, GSV, proximal to distal FV, popliteal vein, gastrocnemius vein, TPT, PTV, popliteal vein and SPJ. Age Indeterminate occlusive thrombus noted in the  CFV, ostial FV, DFV and proximal SSV.  +---------+---------------+---------+-----------+---------------+--------------+ LEFT     CompressibilityPhasicitySpontaneityProperties     Thrombus Aging +---------+---------------+---------+-----------+---------------+--------------+ CFV      Full           Yes      Yes                                       +---------+---------------+---------+-----------+---------------+--------------+ SFJ      Full  Yes      Yes                                      +---------+---------------+---------+-----------+---------------+--------------+ FV Prox  Full           Yes      Yes                                      +---------+---------------+---------+-----------+---------------+--------------+ FV Mid   Full           Yes      Yes                                      +---------+---------------+---------+-----------+---------------+--------------+ FV DistalFull           Yes      Yes                                      +---------+---------------+---------+-----------+---------------+--------------+ PFV      None           No       No         softly         Acute                                                      echogenic                     +---------+---------------+---------+-----------+---------------+--------------+ POP      Full           Yes      Yes                                      +---------+---------------+---------+-----------+---------------+--------------+ PTV      None           No       No         softly         Acute                                                      echogenic                     +---------+---------------+---------+-----------+---------------+--------------+ PERO     None           No       No         softly         Acute  echogenic                     +---------+---------------+---------+-----------+---------------+--------------+ Soleal   None           No       No         softly         Acute                                                      echogenic                     +---------+---------------+---------+-----------+---------------+--------------+ Gastroc  Full           Yes      Yes                                       +---------+---------------+---------+-----------+---------------+--------------+ GSV      None           No       No         softly         Acute                                                      echogenic                     +---------+---------------+---------+-----------+---------------+--------------+ SSV      None           No       No         softly         Acute                                                      echogenic                     +---------+---------------+---------+-----------+---------------+--------------+ TPT      None           No       No         softly         Acute                                                      echogenic                     +---------+---------------+---------+-----------+---------------+--------------+  Left Technical Findings: Acute occlusive thrombus in the proximal GSV, not invading the SFJ (.37 cm from junction). Acute occlusive thrombus in the DFV, TPT, PTV, peroneal veins, SSV and soleal vein.  Findings reported to Dr. Gardiner Rhyme at 2:00 pm.  Summary: RIGHT: - Findings consistent with acute  deep vein thrombosis involving the SF junction, right femoral vein, right popliteal vein, right posterior tibial veins, right peroneal veins, right soleal veins, right gastrocnemius veins, and TPT and SPJ. - Findings consistent with acute superficial vein thrombosis involving the right great saphenous vein. - Findings consistent with age indeterminate deep vein thrombosis involving the right common femoral vein, right femoral vein, and right proximal profunda vein. - Findings consistent with age indeterminate superficial vein thrombosis involving the right small saphenous vein. - No cystic structure found in the popliteal fossa. - Findings consistent with acute deep vein thrombosis involving the distal external iliac vein.  LEFT: - Findings consistent with acute deep vein thrombosis involving the left proximal profunda vein, left  posterior tibial veins, left peroneal veins, left soleal veins, and TPT. - Findings consistent with acute superficial vein thrombosis involving the left great saphenous vein, and left small saphenous vein. - No cystic structure found in the popliteal fossa.  Incidental findings: Abnormal dilatation of the proximal abdominal aorta, measuring 3.2 cm AP x 3.2 cm TRV. Suggest follow-up for AAA duplex. *See table(s) above for measurements and observations. Electronically signed by Ida Rogue MD on 11/23/2020 at 6:44:57 PM.    Final         Scheduled Meds:  Chlorhexidine Gluconate Cloth  6 each Topical Daily   enoxaparin (LOVENOX) injection  80 mg Subcutaneous BID   Ensure Max Protein  11 oz Oral Daily   warfarin  10 mg Oral ONCE-1600   Warfarin - Pharmacist Dosing Inpatient   Does not apply q1600   Continuous Infusions:   LOS: 1 day    Time spent: 51 minutes spent on chart review, discussion with nursing staff, consultants, updating family and interview/physical exam; more than 50% of that time was spent in counseling and/or coordination of care.    Kaydince Towles J [place] DO [facility] Available via Epic secure chat 7am-7pm After these hours, please refer to coverage provider listed on [email or web address] 11/24/2020, 1:54 PM

## 2020-11-24 NOTE — Consult Note (Signed)
Cardiology Consultation:   Patient ID: Marcus [name] MRN: [id number]; DOB: Aug 24, 1945  Admit date: 11/23/2020 Date of Consult: 11/24/2020  PCP:  [name], MD   [facility] Providers Cardiologist:  [name], MD        Patient Profile:   Marcus [name] is a 75 y.o. male with a hx of atrial fibrillation status post aortic valve replacement who is being seen 11/24/2020 for the evaluation of acute DVT at the request of Marcus [name]  History of Present Illness:   Marcus [name] 75 year old male patient of cardiology with mechanical aortic valve replacement goal INR is between 2 and 3 who came to the office with acute leg swelling.  Was sent to the emergency room for further evaluation secondary to acute DVT.  INR was noted recently to be 1.7, slightly subtherapeutic.  He is on chronic warfarin given his mechanical aortic valve.  Has had a prior history of CABG, paroxysmal atrial fibrillation and obstructive sleep apnea and is currently receiving chemotherapy for lung cancer.  Extensive bilateral DVT seen on ultrasound.    Notable without any chest pain fever chills nausea vomiting syncope bleeding   Past Medical History:  Diagnosis Date   Atrial fibrillation ([place])    Dyspnea    with exertion    Dysrhythmia    Hypertension    Obstructive sleep apnea 10/25/2007   cpap   S/P AVR    2D ECHO, 04/25/2011 - EF >55%, Right ventricle-mild-moderately dilated   Swelling of limb    LEA VENOUS, 08/22/2009 - no evidence of deep vein or superficial thrombosis; partially rupturing Baker's Cyst    Past Surgical History:  Procedure Laterality Date   CARDIAC CATHETERIZATION Bilateral 05/10/2007   Significant 1-vessel disease, severely dilated aortic root with moderate severe aortic insufficiency   CARDIAC SURGERY     CARDIOVERSION  08/02/2007   150 Joule biphasic shock with restoration of sinus rhythm. Heart rate 60.   cataract surgery      COLONOSCOPY WITH PROPOFOL N/A 12/15/2016    Procedure: COLONOSCOPY WITH PROPOFOL;  Surgeon: Carol Ada, MD;  Location: [facility];  Service: Endoscopy;  Laterality: N/A;   EYE SURGERY     INTERCOSTAL NERVE BLOCK Right 12/08/2019   Procedure: INTERCOSTAL NERVE BLOCK;  Surgeon: [name], MD;  Location: [place];  Service: Thoracic;  Laterality: Right;   IR IMAGING GUIDED PORT INSERTION  09/28/2020   IR THORACENTESIS ASP PLEURAL SPACE W/IMG GUIDE  08/05/2020   NM MYOVIEW LTD  04/08/2007   No evidence of inducible myocardial ischemia   NODE DISSECTION Right 12/08/2019   Procedure: NODE DISSECTION;  Surgeon: [name], MD;  Location: [place];  Service: Thoracic;  Laterality: Right;   THYROIDECTOMY, PARTIAL     torn meniscus in right knee surgery        Home Medications:  Prior to Admission medications   Medication Sig Start Date End Date Taking? Authorizing Provider  acetaminophen (TYLENOL) 500 MG tablet Take 500 mg by mouth every 6 (six) hours as needed for mild pain or headache.   Yes [provider]  ALPRAZolam (XANAX) 0.25 MG tablet Take 1 tablet (0.25 mg total) by mouth every 8 (eight) hours as needed for up to 20 doses for anxiety. 08/13/20  Yes [name], MD  atorvastatin (LIPITOR) 80 MG tablet TAKE 1 TABLET BY MOUTH EVERY DAY Patient taking differently: Take 80 mg by mouth at bedtime. 07/28/20  Yes Troy Sine, MD  cetirizine (  ZYRTEC) 10 MG tablet Take 10 mg by mouth at bedtime.   Yes [provider]  Coenzyme Q10-Vitamin E (QUNOL ULTRA COQ10 PO) Take 1 capsule by mouth daily with breakfast.   Yes [provider]  dexamethasone (DECADRON) 4 MG tablet Take 4 mg by mouth See admin instructions. Take 4 mg by mouth two times a day the day before, day of, and day after chemotherapy   Yes [provider]  folic acid (FOLVITE) 1 MG tablet TAKE 1 TABLET BY MOUTH EVERY DAY Patient taking differently: Take 1 mg by mouth daily with breakfast. 10/18/20  Yes [name],  [name], PA-C  furosemide (LASIX) 20 MG tablet Take 20 mg by mouth as needed for edema.   Yes [provider]  lidocaine-prilocaine (EMLA) cream Apply 1 application topically as needed. Patient taking differently: Apply 1 application topically as needed (as directed). 09/15/20  Yes [name], [name], PA-C  Multiple Vitamin (ONE-A-DAY 55 PLUS PO) Take 1 tablet by mouth daily with breakfast.   Yes [provider]  Multiple Vitamins-Minerals (PRESERVISION AREDS 2) CAPS Take 1 capsule by mouth daily with breakfast.   Yes [provider]  prochlorperazine (COMPAZINE) 10 MG tablet Take 1 tablet (10 mg total) by mouth every 6 (six) hours as needed. Patient taking differently: Take 10 mg by mouth every 6 (six) hours as needed for nausea or vomiting. 08/25/20  Yes [name], [name], PA-C  ramipril (ALTACE) 10 MG capsule TAKE 1 CAPSULE BY MOUTH EVERY DAY Patient taking differently: Take 10 mg by mouth daily. 08/10/20  Yes [name], Mihai, MD  warfarin (COUMADIN) 10 MG tablet TAKE 1 TO 1 AND 1/2 TABLETS DAILY AS DIRECTED BY COUMADIN CLINIC Patient taking differently: Take 5-15 mg by mouth See admin instructions. Take 10 mg by mouth at bedtime on Sun/Tues/Thurs/Fri/Sat, 15 mg on Wednesdays, and 5 mg on Mondays 09/15/20  Yes [name], Mihai, MD  enoxaparin (LOVENOX) 120 MG/0.8ML injection Inject 0.8 mLs (120 mg total) into the skin 1 tonight and 2 tomorrow AM/PM Patient not taking: Reported on 11/23/2020 09/15/20   [name], Mihai, MD  HYDROcodone-acetaminophen (NORCO/VICODIN) 5-325 MG tablet Take 1 tablet by mouth every 4 (four) hours as needed for moderate pain. Patient not taking: No sig reported 09/15/20   [name], [name], MD    Inpatient Medications: Scheduled Meds:  Chlorhexidine Gluconate Cloth  6 each Topical Daily   warfarin  10 mg Oral ONCE-1600   Warfarin - Pharmacist Dosing Inpatient   Does not apply q1600   Continuous Infusions:  heparin 1,450 Units/hr  (11/24/20 0537)   PRN Meds: morphine injection, ondansetron **OR** ondansetron (ZOFRAN) IV, sodium chloride flush  Allergies:    Allergies  Allergen Reactions   Amlodipine Anaphylaxis    Weakness and fatigue, also    Sulfa Antibiotics Shortness Of Breath   Tetanus Toxoids Anaphylaxis   Crestor [Rosuvastatin] Other (See Comments)    Myalgia    Penicillins Hives    Has patient had a PCN reaction causing immediate rash, facial/tongue/throat swelling, SOB or lightheadedness with hypotension: No Has patient had a PCN reaction causing severe rash involving mucus membranes or skin necrosis: Yes Has patient had a PCN reaction that required hospitalization: No Has patient had a PCN reaction occurring within the last 10 years: No If all of the above answers are "NO", then may proceed with Cephalosporin use.     Social History:   Social History   Socioeconomic History   Marital status: Married    Spouse  name: diane   Number of children: Not on file   Years of education: Not on file   Highest education level: Not on file  Occupational History   Occupation: retired  Tobacco Use   Smoking status: Former    Types: Cigarettes    Quit date: 04/11/1987    Years since quitting: 33.6   Smokeless tobacco: Never  Vaping Use   Vaping Use: Never used  Substance and Sexual Activity   Alcohol use: No   Drug use: No   Sexual activity: Not on file  Other Topics Concern   Not on file  Social History Narrative   Not on file   Social Determinants of Health   Financial Resource Strain: Low Risk    Difficulty of Paying Living Expenses: Not hard at all  Food Insecurity: No Food Insecurity   Worried About Charity fundraiser in the Last Year: Never true   [place] in the Last Year: Never true  Transportation Needs: No Transportation Needs   Lack of Transportation (Medical): No   Lack of Transportation (Non-Medical): No  Physical Activity: Insufficiently Active   Days of Exercise per  Week: 3 days   Minutes of Exercise per Session: 40 min  Stress: No Stress Concern Present   Feeling of Stress : Not at all  Social Connections: Moderately Integrated   Frequency of Communication with Friends and Family: More than three times a week   Frequency of Social Gatherings with Friends and Family: Never   Attends Religious Services: 1 to 4 times per year   Active Member of [employer] or Organizations: No   Attends Music therapist: Never   Marital Status: Married  Human resources officer Violence: Not At Risk   Fear of Current or Ex-Partner: No   Emotionally Abused: No   Physically Abused: No   Sexually Abused: No    Family History:    Family History  Problem Relation Age of Onset   Cancer Father      ROS:  Please see the history of present illness.   All other ROS reviewed and negative.     Physical Exam/Data:   Vitals:   11/23/20 2028 11/23/20 2348 11/24/20 0313 11/24/20 0630  BP: 126/66 122/74 119/72 118/77  Pulse: 85 87 71 (!) 45  Resp:  18 18 15   Temp: 98.4 F (36.9 C) 98.2 F (36.8 C) 98.1 F (36.7 C) 98.1 F (36.7 C)  TempSrc: Oral Oral Oral Oral  SpO2: 98% 97% 99% 98%  Height:        Intake/Output Summary (Last 24 hours) at 11/24/2020 1102 Last data filed at 11/24/2020 0600 Gross per 24 hour  Intake --  Output 200 ml  Net -200 ml   Last 3 Weights 11/04/2020 10/14/2020 09/28/2020  Weight (lbs) 165 lb 3.2 oz 164 lb 3.2 oz 160 lb  Weight (kg) 74.934 kg 74.481 kg 72.576 kg     Body mass index is 24.4 kg/m.  General:  Well nourished, well developed, in no acute distress HEENT: normal Lymph: no adenopathy Neck: no JVD Endocrine:  No thryomegaly Vascular: No carotid bruits; FA pulses 2+ bilaterally without bruits  Cardiac:  normal S1, click mechanical S2; irregularly irregular normal rate; no murmur  Lungs: Crackles bilaterally, very mild Abd: soft, nontender, no hepatomegaly  Ext: Mild lower extremity edema  Musculoskeletal:  No deformities,  BUE and BLE strength normal and equal Skin: warm and dry  Neuro:  CNs 2-12 intact, no focal  abnormalities noted Psych:  Normal affect   EKG:  The EKG was personally reviewed and demonstrates: Atrial fibrillation 74 bpm right bundle branch block Telemetry:  Telemetry was personally reviewed and demonstrates: Atrial fibrillation rate controlled  Relevant CV Studies:  ECHO 09/09/20  1. Left ventricular ejection fraction, by estimation, is 60 to 65%. The  left ventricle has normal function. The left ventricle has no regional  wall motion abnormalities. There is mild concentric left ventricular  hypertrophy. Left ventricular diastolic  parameters are indeterminate.   2. Right ventricular systolic function is normal. The right ventricular  size is normal.   3. The mitral valve is normal in structure. Trivial mitral valve  regurgitation. No evidence of mitral stenosis.   4. The aortic valve has been repaired/replaced. Aortic valve  regurgitation is trivial. There is a 23 mm St. Jude bileaflet valve  present in the aortic position. Aortic valve mean gradient measures 13.3  mmHg.   5. The inferior vena cava is normal in size with greater than 50%  respiratory variability, suggesting right atrial pressure of 3 mmHg.   NUC stress: 12/02/19: Nuclear stress EF: 63%. There was no ST segment deviation noted during stress. No T wave inversion was noted during stress. The study is normal. This is a low risk study.   Low risk stress nuclear study with normal perfusion and normal left ventricular regional and global systolic function.  Laboratory Data:  High Sensitivity Troponin:  No results for input(s): TROPONINIHS in the last 720 hours.   Chemistry Recent Labs  Lab 11/18/20 1418 11/23/20 1630 11/24/20 0455  NA 138 136 137  K 4.3 3.6 3.9  CL 105 103 103  CO2 24 26 25   GLUCOSE 78 101* 97  BUN 15 21 19   CREATININE 0.75 0.65 0.74  CALCIUM 8.6* 8.2* 8.0*  GFRNONAA >60 >60 >60  ANIONGAP  9 7 9     Recent Labs  Lab 11/18/20 1418 11/23/20 1630 11/24/20 0455  PROT 6.5 6.7 5.9*  ALBUMIN 3.1* 3.2* 2.8*  AST 29 27 24   ALT 23 19 16   ALKPHOS 98 94 83  BILITOT 0.5 1.0 0.8   Hematology Recent Labs  Lab 11/18/20 1418 11/23/20 1630 11/24/20 0455  WBC 3.1* 3.4* 2.9*  RBC 2.60* 3.31* 2.94*  HGB 7.7* 9.7* 8.7*  HCT 23.0* 30.0* 26.7*  MCV 88.5 90.6 90.8  MCH 29.6 29.3 29.6  MCHC 33.5 32.3 32.6  RDW 20.2* 19.4* 19.6*  PLT 58* 122* 116*   BNPNo results for input(s): BNP, PROBNP in the last 168 hours.  DDimer No results for input(s): DDIMER in the last 168 hours.   Radiology/Studies:  DG Chest Port 1 View  Result Date: 11/23/2020 CLINICAL DATA:  History of lung cancer. EXAM: PORTABLE CHEST 1 VIEW COMPARISON:  Chest x-ray 09/07/2020 FINDINGS: The cardiac silhouette, mediastinal and hilar contours are within normal limits and stable. Stable surgical changes from cardiac surgery. Right IJ power port in good position without complicating features. Chronic pulmonary scarring changes.  Chronic right-sided pleural-. IMPRESSION: Chronic lung changes but no acute pulmonary findings. Electronically Signed   By: Marijo Sanes M.D.   On: 11/23/2020 17:24   VAS [place] LOWER EXTREMITY VENOUS (DVT)  Result Date: 11/23/2020  Lower Venous DVT Study Patient Name:  RAFFAELE DERISE Kochan  Date of Exam:   11/23/2020 Medical Rec #: [id number]      Accession #:    [id number] Date of Birth: [birth year]       Patient Gender: M Patient  Age:   30 years Exam Location:  [facility] Procedure:      VAS [place] LOWER EXTREMITY VENOUS (DVT) Referring Phys: [facility] [name] --------------------------------------------------------------------------------  Indications: Patient presents today with complaints of acute onset of right lower extremity swelling x 3 days, and right groin pain x 1 day. He denies any symptoms involving the left lower extremity. He denies any SOB.  Risk Factors: Cancer of the lung and currently doing chemotherapy  since August 2021. Anticoagulation: Coumadin.  Performing Technologist: Sharlett Iles RVT  Examination Guidelines: A complete evaluation includes B-mode imaging, spectral Doppler, color Doppler, and power Doppler as needed of all accessible portions of each vessel. Bilateral testing is considered an integral part of a complete examination. Limited examinations for reoccurring indications may be performed as noted. The reflux portion of the exam is performed with the patient in reverse Trendelenburg.  +---------+---------------+---------+-----------+---------------+--------------+ RIGHT    CompressibilityPhasicitySpontaneityProperties     Thrombus Aging +---------+---------------+---------+-----------+---------------+--------------+ CFV      None           No       No         softly         Age                                                        echogenic and  Indeterminate                                              brightly                                                                  echogenic                     +---------+---------------+---------+-----------+---------------+--------------+ SFJ      None           No       No         softly         Acute                                                      echogenic                     +---------+---------------+---------+-----------+---------------+--------------+ FV Prox  None           No       No         softly         Age  echogenic and  Indeterminate                                              brightly                      +---------+---------------+---------+-----------+---------------+--------------+ FV Mid   None           No       No         softly         Acute                                                      echogenic                      +---------+---------------+---------+-----------+---------------+--------------+ FV DistalNone           No       No         softly         Acute                                                      echogenic                     +---------+---------------+---------+-----------+---------------+--------------+ PFV      None           No       No         softly         Age                                                        echogenic and  Indeterminate                                              brightly                                                                  echogenic                     +---------+---------------+---------+-----------+---------------+--------------+ POP      None           No       No         softly         Acute  echogenic                     +---------+---------------+---------+-----------+---------------+--------------+ PTV      None           No       No         softly         Acute                                                      echogenic                     +---------+---------------+---------+-----------+---------------+--------------+ PERO     None           No       No         softly         Acute                                                      echogenic                     +---------+---------------+---------+-----------+---------------+--------------+ Soleal   None           No       No         softly         Acute                                                      echogenic                     +---------+---------------+---------+-----------+---------------+--------------+ Gastroc  None           No       No         softly         Acute                                                      echogenic                     +---------+---------------+---------+-----------+---------------+--------------+ GSV       None           No       No         softly         Acute                                                      echogenic                     +---------+---------------+---------+-----------+---------------+--------------+  SSV      None           No       No         softly         Age                                                        echogenic      Indeterminate  +---------+---------------+---------+-----------+---------------+--------------+ SPJ      None           No       No         softly         Acute                                                      echogenic                     +---------+---------------+---------+-----------+---------------+--------------+ TPT      None           No       No         softly         Acute                                                      echogenic                     +---------+---------------+---------+-----------+---------------+--------------+   Right Technical Findings: Acute non-occlusive thrombus in the distal external iliac vein. Acute occlusive thrombus noted in the SFJ, GSV, proximal to distal FV, popliteal vein, gastrocnemius vein, TPT, PTV, popliteal vein and SPJ. Age Indeterminate occlusive thrombus noted in the  CFV, ostial FV, DFV and proximal SSV.  +---------+---------------+---------+-----------+---------------+--------------+ LEFT     CompressibilityPhasicitySpontaneityProperties     Thrombus Aging +---------+---------------+---------+-----------+---------------+--------------+ CFV      Full           Yes      Yes                                      +---------+---------------+---------+-----------+---------------+--------------+ SFJ      Full           Yes      Yes                                      +---------+---------------+---------+-----------+---------------+--------------+ FV Prox  Full           Yes      Yes                                       +---------+---------------+---------+-----------+---------------+--------------+ FV Mid   Full  Yes      Yes                                      +---------+---------------+---------+-----------+---------------+--------------+ FV DistalFull           Yes      Yes                                      +---------+---------------+---------+-----------+---------------+--------------+ PFV      None           No       No         softly         Acute                                                      echogenic                     +---------+---------------+---------+-----------+---------------+--------------+ POP      Full           Yes      Yes                                      +---------+---------------+---------+-----------+---------------+--------------+ PTV      None           No       No         softly         Acute                                                      echogenic                     +---------+---------------+---------+-----------+---------------+--------------+ PERO     None           No       No         softly         Acute                                                      echogenic                     +---------+---------------+---------+-----------+---------------+--------------+ Soleal   None           No       No         softly         Acute  echogenic                     +---------+---------------+---------+-----------+---------------+--------------+ Gastroc  Full           Yes      Yes                                      +---------+---------------+---------+-----------+---------------+--------------+ GSV      None           No       No         softly         Acute                                                      echogenic                     +---------+---------------+---------+-----------+---------------+--------------+ SSV       None           No       No         softly         Acute                                                      echogenic                     +---------+---------------+---------+-----------+---------------+--------------+ TPT      None           No       No         softly         Acute                                                      echogenic                     +---------+---------------+---------+-----------+---------------+--------------+  Left Technical Findings: Acute occlusive thrombus in the proximal GSV, not invading the SFJ (.37 cm from junction). Acute occlusive thrombus in the DFV, TPT, PTV, peroneal veins, SSV and soleal vein.  Findings reported to Marcus. Gardiner Rhyme at 2:00 pm.  Summary: RIGHT: - Findings consistent with acute deep vein thrombosis involving the SF junction, right femoral vein, right popliteal vein, right posterior tibial veins, right peroneal veins, right soleal veins, right gastrocnemius veins, and TPT and SPJ. - Findings consistent with acute superficial vein thrombosis involving the right great saphenous vein. - Findings consistent with age indeterminate deep vein thrombosis involving the right common femoral vein, right femoral vein, and right proximal profunda vein. - Findings consistent with age indeterminate superficial vein thrombosis involving the right small saphenous vein. - No cystic structure found in the popliteal fossa. - Findings consistent with acute deep vein thrombosis involving the distal external iliac vein.  LEFT: - Findings consistent with acute deep vein thrombosis  involving the left proximal profunda vein, left posterior tibial veins, left peroneal veins, left soleal veins, and TPT. - Findings consistent with acute superficial vein thrombosis involving the left great saphenous vein, and left small saphenous vein. - No cystic structure found in the popliteal fossa.  Incidental findings: Abnormal dilatation of the proximal abdominal  aorta, measuring 3.2 cm AP x 3.2 cm TRV. Suggest follow-up for AAA duplex. *See table(s) above for measurements and observations. Electronically signed by Ida Rogue MD on 11/23/2020 at 6:44:57 PM.    Final      Assessment and Plan:   Extensive bilateral DVT in the setting of stage III lung cancer on warfarin currently for mechanical aortic valve and paroxysmal atrial fibrillation with CAD post CABG.  Acute DVT bilateral - Fairly extensive large clot burden.  Agree that he should be placed on Lovenox therapy indefinitely as he was previously on warfarin.  However, if oncology feels differently I will defer to their judgment. Perhaps we can keep the Lovenox during the duration of DVT treatment and then try to transition back to Coumadin/warfarin after that time.  His wife does state that they have been using Coumadin since 2009 and have been doing very well with it until recently with fluctuations in the setting of his chemotherapy.  Coronary artery disease status post CABG - Stable, no anginal symptoms.  Aortic valve replacement - Needs chronic anticoagulation.  Previously on warfarin.  I would encourage Lovenox injection from this point forward.  He is clearly at a higher/hypercoagulable type state with his lung cancer given his extensive DVTs despite being on warfarin therapy.  I am aware that he was subtherapeutic at 1.7 briefly however this was transient.  Stage III lung cancer - Per oncology.  Appreciate their input regarding anticoagulation.  Hyperlipidemia - Continue with atorvastatin 80 mg high intensity dose.  Essential hypertension - Agree with ACE inhibitor/Lasix.  Please let us know if we can be of further assistance.  We will go ahead and sign off.   For questions or updates, please contact [place] Please consult [email or web address] for contact info under    Signed, Candee Furbish, MD  11/24/2020 11:02 AM

## 2020-11-24 NOTE — Progress Notes (Signed)
[place] for IV heparin & warfarin Indication: DVT  Allergies  Allergen Reactions   Amlodipine Anaphylaxis    Weakness and fatigue, also    Sulfa Antibiotics Shortness Of Breath   Tetanus Toxoids Anaphylaxis   Crestor [Rosuvastatin] Other (See Comments)    Myalgia    Penicillins Hives    Has patient had a PCN reaction causing immediate rash, facial/tongue/throat swelling, SOB or lightheadedness with hypotension: No Has patient had a PCN reaction causing severe rash involving mucus membranes or skin necrosis: Yes Has patient had a PCN reaction that required hospitalization: No Has patient had a PCN reaction occurring within the last 10 years: No If all of the above answers are "NO", then may proceed with Cephalosporin use.     Patient Measurements: Height: 5\' 9"  (175.3 cm) IBW/kg (Calculated) : 70.7 Heparin Dosing Weight: TBW (used 11/04/20 weight 75 kg)  Vital Signs: Temp: 98.1 F (36.7 C) (08/17 0313) Temp Source: Oral (08/17 0313) BP: 119/72 (08/17 0313) Pulse Rate: 71 (08/17 0313)  Labs: Recent Labs    11/22/20 1219 11/23/20 1630 11/24/20 0455  HGB  --  9.7* 8.7*  HCT  --  30.0* 26.7*  PLT  --  122* 116*  LABPROT  --  28.7* 29.9*  INR 4.7* 2.7* 2.9*  HEPARINUNFRC  --   --  0.26*  CREATININE  --  0.65  --      CrCl cannot be calculated (Unknown ideal weight.).   Medical History: Past Medical History:  Diagnosis Date   Atrial fibrillation ([place])    Dyspnea    with exertion    Dysrhythmia    Hypertension    Obstructive sleep apnea 10/25/2007   cpap   S/P AVR    2D ECHO, 04/25/2011 - EF >55%, Right ventricle-mild-moderately dilated   Swelling of limb    LEA VENOUS, 08/22/2009 - no evidence of deep vein or superficial thrombosis; partially rupturing Baker's Cyst    Medications:  Medications Prior to Admission  Medication Sig Dispense Refill Last Dose   acetaminophen (TYLENOL) 500 MG tablet Take 500 mg by mouth  every 6 (six) hours as needed for mild pain or headache.   unk   ALPRAZolam (XANAX) 0.25 MG tablet Take 1 tablet (0.25 mg total) by mouth every 8 (eight) hours as needed for up to 20 doses for anxiety. 20 tablet 0 unk   atorvastatin (LIPITOR) 80 MG tablet TAKE 1 TABLET BY MOUTH EVERY DAY (Patient taking differently: Take 80 mg by mouth at bedtime.) 90 tablet 2 11/22/2020 at pm   cetirizine (ZYRTEC) 10 MG tablet Take 10 mg by mouth at bedtime.   11/23/2020 at pm   Coenzyme Q10-Vitamin E (QUNOL ULTRA COQ10 PO) Take 1 capsule by mouth daily with breakfast.   11/23/2020 at am   dexamethasone (DECADRON) 4 MG tablet Take 4 mg by mouth See admin instructions. Take 4 mg by mouth two times a day the day before, day of, and day after chemotherapy   0/93/2671   folic acid (FOLVITE) 1 MG tablet TAKE 1 TABLET BY MOUTH EVERY DAY (Patient taking differently: Take 1 mg by mouth daily with breakfast.) 30 tablet 2 11/23/2020 at am   furosemide (LASIX) 20 MG tablet Take 20 mg by mouth as needed for edema.   unk   lidocaine-prilocaine (EMLA) cream Apply 1 application topically as needed. (Patient taking differently: Apply 1 application topically as needed (as directed).) 30 g 2 unk   Multiple Vitamin (ONE-A-DAY  55 PLUS PO) Take 1 tablet by mouth daily with breakfast.   11/23/2020 at am   Multiple Vitamins-Minerals (PRESERVISION AREDS 2) CAPS Take 1 capsule by mouth daily with breakfast.   11/23/2020 at am   prochlorperazine (COMPAZINE) 10 MG tablet Take 1 tablet (10 mg total) by mouth every 6 (six) hours as needed. (Patient taking differently: Take 10 mg by mouth every 6 (six) hours as needed for nausea or vomiting.) 30 tablet 2 unk   ramipril (ALTACE) 10 MG capsule TAKE 1 CAPSULE BY MOUTH EVERY DAY (Patient taking differently: Take 10 mg by mouth daily.) 90 capsule 3 11/23/2020   warfarin (COUMADIN) 10 MG tablet TAKE 1 TO 1 AND 1/2 TABLETS DAILY AS DIRECTED BY COUMADIN CLINIC (Patient taking differently: Take 5-15 mg by mouth  See admin instructions. Take 10 mg by mouth at bedtime on Sun/Tues/Thurs/Fri/Sat, 15 mg on Wednesdays, and 5 mg on Mondays) 135 tablet 1 11/22/2020 at 2230   enoxaparin (LOVENOX) 120 MG/0.8ML injection Inject 0.8 mLs (120 mg total) into the skin 1 tonight and 2 tomorrow AM/PM (Patient not taking: Reported on 11/23/2020) 2.4 mL 0 Not Taking   HYDROcodone-acetaminophen (NORCO/VICODIN) 5-325 MG tablet Take 1 tablet by mouth every 4 (four) hours as needed for moderate pain. (Patient not taking: No sig reported) 45 tablet 0 Not Taking   Scheduled:   Chlorhexidine Gluconate Cloth  6 each Topical Daily   Warfarin - Pharmacist Dosing Inpatient   Does not apply q1600    Assessment: 29 yoM with PMH mech AVR on warfarin, CAD s/p CABG, AAA s/p repair, active lung cancer on chemo, sent from [facility] office for extensive bilateral DVTs. Pharmacy to dose heparin and warfarin while INR brought to desired range. (Not considering clot a warfarin failure as pt has had subtherapeutic INRs lately).  Baseline INR therapeutic, aPTT not done Prior anticoagulation: warfarin 10 mg daily, except 15 mg on Wed (per 8/15 [facility] clinic visit), LD 8/15  Significant events:  Today, 11/24/2020: CBC: Hgb (8.7) & Plt (116) both slightly low; recent chemo on 7/28 INR = 2.9  No bleeding or infusion issues per nursing SCr pending  Goal of Therapy: Heparin level 0.3-0.7 units/ml INR 2.5-3.5 (MD wants goal closer to 3.5) Monitor platelets by anticoagulation protocol: Yes  Plan: Increase Heparin to 1450 units/hr IV infusion Check heparin level 8 hrs rate increase Warfarin 10 mg PO tonight at 18:00 - Targeting INR closer to 3.5  Daily INR, CBC; daily heparin level once stable Monitor for signs of bleeding or thrombosis   Leone Haven, PharmD 11/24/2020, 5:31 AM

## 2020-11-24 NOTE — Progress Notes (Signed)
Initial Nutrition Assessment  INTERVENTION:   -Ensure MAX Protein po daily, each supplement provides 150 kcal and 30 grams of protein   NUTRITION DIAGNOSIS:   Increased nutrient needs related to chronic illness as evidenced by estimated needs.  GOAL:   Patient will meet greater than or equal to 90% of their needs   MONITOR:   PO intake, Supplement acceptance, Labs, Weight trends, I & O's  REASON FOR ASSESSMENT:   Malnutrition Screening Tool   ASSESSMENT:   75 y.o. male with medical history significant of patient with history of stage III lung cancer, history of coronary artery disease status post CABG as well as history of aortic valve replacement with mechanical valve on chronic anticoagulation with warfarin, paroxysmal atrial fibrillation, essential hypertension, obstructive sleep apnea, currently receiving chemotherapy for his lung cancer.  Patient in room with wife at bedside. Pt reports having a good appetite, ate most of his eggs and 1/2 of his grits for breakfast. Saving his banana for a snack. Pt ate 100% of lunch later today.  Pt drinks Premier Protein shakes daily for additional protein. Will order Ensure Max daily. Denies issues with chewing, swallowing or taste.  No weight recorded for this admission. Pt's wife states at his last doctor's appointment he weighed around 163 lbs.  Medications reviewed.  Labs reviewed.  NUTRITION - FOCUSED PHYSICAL EXAM:  Flowsheet Row Most Recent Value  Orbital Region Mild depletion  Upper Arm Region Moderate depletion  Thoracic and Lumbar Region Unable to assess  Buccal Region Mild depletion  Temple Region Mild depletion  Clavicle Bone Region No depletion  Clavicle and Acromion Bone Region No depletion  Scapular Bone Region No depletion  Dorsal Hand No depletion  Patellar Region Unable to assess  Anterior Thigh Region Unable to assess  Posterior Calf Region Unable to assess  Edema (RD Assessment) Mild       Diet  Order:   Diet Order             Diet Heart Room service appropriate? Yes; Fluid consistency: Thin  Diet effective now                   EDUCATION NEEDS:   No education needs have been identified at this time  Skin:  Skin Assessment: Reviewed RN Assessment  Last BM:  8/16  Height:   Ht Readings from Last 1 Encounters:  11/23/20 5\' 9"  (1.753 m)    Weight:   Wt Readings from Last 1 Encounters:  11/04/20 74.9 kg    BMI:  Body mass index is 24.4 kg/m.  Estimated Nutritional Needs:   Kcal:  2100-2300  Protein:  100-115g  Fluid:  2.1L/day   Clayton Bibles, MS, RD, LDN Inpatient Clinical Dietitian Contact information available via Amion

## 2020-11-25 ENCOUNTER — Encounter: Payer: Self-pay | Admitting: Internal Medicine

## 2020-11-25 ENCOUNTER — Ambulatory Visit: Payer: Medicare HMO

## 2020-11-25 ENCOUNTER — Other Ambulatory Visit: Payer: Medicare HMO

## 2020-11-25 ENCOUNTER — Ambulatory Visit: Payer: Medicare HMO | Admitting: Internal Medicine

## 2020-11-25 ENCOUNTER — Telehealth: Payer: Self-pay

## 2020-11-25 ENCOUNTER — Other Ambulatory Visit (HOSPITAL_COMMUNITY): Payer: Self-pay

## 2020-11-25 DIAGNOSIS — C3491 Malignant neoplasm of unspecified part of right bronchus or lung: Secondary | ICD-10-CM | POA: Diagnosis not present

## 2020-11-25 DIAGNOSIS — E7800 Pure hypercholesterolemia, unspecified: Secondary | ICD-10-CM

## 2020-11-25 DIAGNOSIS — I824Y3 Acute embolism and thrombosis of unspecified deep veins of proximal lower extremity, bilateral: Secondary | ICD-10-CM | POA: Diagnosis not present

## 2020-11-25 LAB — CBC
HCT: 25.8 % — ABNORMAL LOW (ref 39.0–52.0)
Hemoglobin: 8.5 g/dL — ABNORMAL LOW (ref 13.0–17.0)
MCH: 30 pg (ref 26.0–34.0)
MCHC: 32.9 g/dL (ref 30.0–36.0)
MCV: 91.2 fL (ref 80.0–100.0)
Platelets: 135 10*3/uL — ABNORMAL LOW (ref 150–400)
RBC: 2.83 MIL/uL — ABNORMAL LOW (ref 4.22–5.81)
RDW: 19.5 % — ABNORMAL HIGH (ref 11.5–15.5)
WBC: 2.7 10*3/uL — ABNORMAL LOW (ref 4.0–10.5)
nRBC: 0 % (ref 0.0–0.2)

## 2020-11-25 MED ORDER — HEPARIN SOD (PORK) LOCK FLUSH 100 UNIT/ML IV SOLN
500.0000 [IU] | INTRAVENOUS | Status: AC | PRN
  Administered 2020-11-25: 500 [IU]
  Filled 2020-11-25: qty 5

## 2020-11-25 MED ORDER — ENOXAPARIN SODIUM 80 MG/0.8ML IJ SOSY
80.0000 mg | PREFILLED_SYRINGE | Freq: Two times a day (BID) | INTRAMUSCULAR | 2 refills | Status: AC
  Filled 2020-11-25: qty 40, 25d supply, fill #0
  Filled 2020-11-25: qty 8, 5d supply, fill #0

## 2020-11-25 NOTE — Progress Notes (Signed)
HEMATOLOGY-ONCOLOGY PROGRESS NOTE  SUBJECTIVE: Marcus [name] still has discomfort in his right lower extremity.  He reported a small nosebleed earlier today.  He applied pressure and bleeding stopped fairly quickly.  No other bleeding reported.  He was due for chemotherapy today but his appointment has been delayed by 1 week.  He offers no other complaints today.  He is scheduled for discharge later today.  His wife is at the bedside and knows how to administer Lovenox.  She has administered Lovenox in the past when she has given a Lovenox bridge.  Oncology History  Adenocarcinoma of right lung, stage 3 ([place])  01/01/2020 Initial Diagnosis   Adenocarcinoma of right lung, stage 3 ([place])   05/17/2020 Cancer Staging   Staging form: Lung, AJCC 8th Edition - Clinical: Stage IIIB (cT3, cN2, cM0) - Signed by Curt Bears, MD on 05/17/2020   Recurrent non-small cell lung cancer ([place])  08/25/2020 Initial Diagnosis   Recurrent non-small cell lung cancer ([place])   09/03/2020 -  Chemotherapy    Patient is on Treatment Plan: LUNG CARBOPLATIN / PEMETREXED / PEMBROLIZUMAB Q21D INDUCTION X 4 CYCLES / MAINTENANCE PEMETREXED + PEMBROLIZUMAB        REVIEW OF SYSTEMS:   Constitutional: Denies fevers, chills  Eyes: Denies blurriness of vision Ears, nose, mouth, throat, and face: Denies mucositis or sore throat Respiratory: Denies cough, dyspnea or wheezes Cardiovascular: Denies palpitation, chest discomfort Gastrointestinal:  Denies nausea, heartburn or change in bowel habits Skin: Denies abnormal skin rashes Lymphatics: Denies new lymphadenopathy or easy bruising Neurological:Denies numbness, tingling or new weaknesses Behavioral/Psych: Mood is stable, no new changes  Extremities: Right lower extremity swelling and discomfort All other systems were reviewed with the patient and are negative.  I have reviewed the past medical history, past surgical history, social history and family history with the patient and  they are unchanged from previous note.   PHYSICAL EXAMINATION: ECOG PERFORMANCE STATUS: 1 - Symptomatic but completely ambulatory  Vitals:   11/24/20 2018 11/25/20 0400  BP: 101/62 112/66  Pulse: 65 69  Resp:  15  Temp:  98.6 F (37 C)  SpO2: 99% 97%   Filed Weights   11/24/20 1600  Weight: 74.4 kg    Intake/Output from previous day: 08/17 0701 - 08/18 0700 In: 1078.7 [P.O.:920; I.V.:158.7] Out: 475 [Urine:475]  GENERAL:alert, no distress and comfortable SKIN: skin color, texture, turgor are normal, no rashes or significant lesions LUNGS: clear to auscultation and percussion with normal breathing effort HEART: regular rate & rhythm and no murmurs and edema throughout the entirety of his right lower extremity ABDOMEN:abdomen soft, non-tender and normal bowel sounds NEURO: alert & oriented x 3 with fluent speech, no focal motor/sensory deficits  LABORATORY DATA:  I have reviewed the data as listed CMP Latest Ref Rng & Units 11/24/2020 11/23/2020 11/18/2020  Glucose 70 - 99 mg/dL 97 101(H) 78  BUN 8 - 23 mg/dL 19 21 15   Creatinine 0.61 - 1.24 mg/dL 0.74 0.65 0.75  Sodium 135 - 145 mmol/L 137 136 138  Potassium 3.5 - 5.1 mmol/L 3.9 3.6 4.3  Chloride 98 - 111 mmol/L 103 103 105  CO2 22 - 32 mmol/L 25 26 24   Calcium 8.9 - 10.3 mg/dL 8.0(L) 8.2(L) 8.6(L)  Total Protein 6.5 - 8.1 g/dL 5.9(L) 6.7 6.5  Total Bilirubin 0.3 - 1.2 mg/dL 0.8 1.0 0.5  Alkaline Phos 38 - 126 U/L 83 94 98  AST 15 - 41 U/L 24 27 29   ALT 0 - 44  U/L 16 19 23     Lab Results  Component Value Date   WBC 2.7 (L) 11/25/2020   HGB 8.5 (L) 11/25/2020   HCT 25.8 (L) 11/25/2020   MCV 91.2 11/25/2020   PLT 135 (L) 11/25/2020   NEUTROABS 2.1 11/23/2020    CT Chest W Contrast  Result Date: 11/02/2020 CLINICAL DATA:  Primary Cancer Type: Lung Imaging Indication: Assess response to therapy Interval therapy since last imaging? Yes Initial Cancer Diagnosis Date: 12/08/2019; Established by: Biopsy-proven  Detailed Pathology: Stage IIIb non-small cell lung cancer, adenocarcinoma. Primary Tumor location: Right upper lobe. Recurrence? Yes; Date(s) of recurrence: 08/20/2020; Established by: Biopsy-proven Surgeries: Right upper lobectomy 12/08/2019.  Partial thyroidectomy. Chemotherapy: Yes; Ongoing? Yes; Most recent administration: 10/14/2020 Immunotherapy?  Yes; Type: Keytruda; Ongoing? Yes Radiation therapy? No EXAM: CT CHEST WITH CONTRAST TECHNIQUE: Multidetector CT imaging of the chest was performed during intravenous contrast administration. CONTRAST:  50m OMNIPAQUE IOHEXOL 350 MG/ML SOLN COMPARISON:  08/20/2020 PET-CT. Most recent CT chest, abdomen and pelvis 08/01/2020. FINDINGS: Cardiovascular: Normal heart size. No significant pericardial effusion/thickening. Aortic valve prosthesis in place. Three-vessel coronary atherosclerosis. Right internal jugular Port-A-Cath terminates at the cavoatrial junction. Atherosclerotic nonaneurysmal thoracic aorta. Top-normal caliber main pulmonary artery (3.0 cm diameter). No central pulmonary emboli. Mediastinum/Nodes: No discrete thyroid nodules. Unremarkable esophagus. No axillary adenopathy. Mildly enlarged 1.0 cm short axis diameter subcarinal node (series 2/image 76), stable from 1.0 cm on 08/01/2020 chest CT using similar measurement technique. Indistinct subcentimeter clustered nodularity in the high right internal mammary region (series 2/image 44), not substantially changed since 08/01/2020. There is a heterogeneously enhancing right pericardiophrenic 3.4 x 2.6 cm mass (series 2/image 116), increased from 2.1 x 1.2 cm on 08/20/2020 PET-CT and new since 08/01/2020 chest CT. No hilar adenopathy. Lungs/Pleura: No pneumothorax. Trace dependent right pleural effusion, mildly decreased from 08/20/2020 PET-CT. Scattered tiny subcentimeter right pleural nodules throughout the right pleural space, for example 0.4 cm in the posterior lower right pleural space (series 2/image  123) and 0.4 cm in the peripheral right mid pleural space (series 2/image 70), not discretely visualized on prior scans. A few scattered nodules along the right fissures, largest 0.7 cm (series 5/image 69), new. No left pleural effusion. Status post right upper lobectomy. Mild-to-moderate centrilobular and paraseptal emphysema. No acute consolidative airspace disease, lung masses or significant pulmonary nodules. Scattered mild interlobular septal thickening throughout right lung, most prominent in the superior right middle lobe, similar. Upper abdomen: Simple 2.7 cm posterior upper right renal cyst. Musculoskeletal: No aggressive appearing focal osseous lesions. Mild thoracic spondylosis. Intact sternotomy wires. IMPRESSION: 1. Findings compatible with mild progression of metastatic disease in the right pleural space and right mediastinum. Widespread scattered subcentimeter right pleural nodularity appears new. New heterogeneously enhancing right pericardiophrenic mass. 2. Similar interlobular septal thickening in the right lung, compatible with lymphangitic carcinomatosis. 3. Similar subpleural mediastinal nodularity in the right internal mammary region compatible with metastatic disease. 4. Trace dependent right pleural effusion, mildly decreased from 08/20/2020 PET-CT. 5. Mild subcarinal adenopathy is stable. 6. Three-vessel coronary atherosclerosis. 7. Aortic Atherosclerosis (ICD10-I70.0) and Emphysema (ICD10-J43.9). Electronically Signed   By: JIlona SorrelM.D.   On: 11/02/2020 12:04   DG Chest Port 1 View  Result Date: 11/23/2020 CLINICAL DATA:  History of lung cancer. EXAM: PORTABLE CHEST 1 VIEW COMPARISON:  Chest x-ray 09/07/2020 FINDINGS: The cardiac silhouette, mediastinal and hilar contours are within normal limits and stable. Stable surgical changes from cardiac surgery. Right IJ power port in good position without complicating features.  Chronic pulmonary scarring changes.  Chronic right-sided  pleural-. IMPRESSION: Chronic lung changes but no acute pulmonary findings. Electronically Signed   By: Marijo Sanes M.D.   On: 11/23/2020 17:24   VAS [place] LOWER EXTREMITY VENOUS (DVT)  Result Date: 11/23/2020  Lower Venous DVT Study Patient Name:  Marcus [name]  Date of Exam:   11/23/2020 Medical Rec #: [id number]      Accession #:    [id number] Date of Birth: 1946/02/10       Patient Gender: M Patient Age:   36 years Exam Location:  [facility] Procedure:      VAS [place] LOWER EXTREMITY VENOUS (DVT) Referring Phys: [name] --------------------------------------------------------------------------------  Indications: Patient presents today with complaints of acute onset of right lower extremity swelling x 3 days, and right groin pain x 1 day. He denies any symptoms involving the left lower extremity. He denies any SOB.  Risk Factors: Cancer of the lung and currently doing chemotherapy since August 2021. Anticoagulation: Coumadin.  Performing Technologist: Sharlett Iles RVT  Examination Guidelines: A complete evaluation includes B-mode imaging, spectral Doppler, color Doppler, and power Doppler as needed of all accessible portions of each vessel. Bilateral testing is considered an integral part of a complete examination. Limited examinations for reoccurring indications may be performed as noted. The reflux portion of the exam is performed with the patient in reverse Trendelenburg.  +---------+---------------+---------+-----------+---------------+--------------+ RIGHT    CompressibilityPhasicitySpontaneityProperties     Thrombus Aging +---------+---------------+---------+-----------+---------------+--------------+ CFV      None           No       No         softly         Age                                                        echogenic and  Indeterminate                                              brightly                                                                   echogenic                     +---------+---------------+---------+-----------+---------------+--------------+ SFJ      None           No       No         softly         Acute                                                      echogenic                     +---------+---------------+---------+-----------+---------------+--------------+  FV Prox  None           No       No         softly         Age                                                        echogenic and  Indeterminate                                              brightly                      +---------+---------------+---------+-----------+---------------+--------------+ FV Mid   None           No       No         softly         Acute                                                      echogenic                     +---------+---------------+---------+-----------+---------------+--------------+ FV DistalNone           No       No         softly         Acute                                                      echogenic                     +---------+---------------+---------+-----------+---------------+--------------+ PFV      None           No       No         softly         Age                                                        echogenic and  Indeterminate                                              brightly  echogenic                     +---------+---------------+---------+-----------+---------------+--------------+ POP      None           No       No         softly         Acute                                                      echogenic                     +---------+---------------+---------+-----------+---------------+--------------+ PTV      None           No       No         softly         Acute                                                       echogenic                     +---------+---------------+---------+-----------+---------------+--------------+ PERO     None           No       No         softly         Acute                                                      echogenic                     +---------+---------------+---------+-----------+---------------+--------------+ Soleal   None           No       No         softly         Acute                                                      echogenic                     +---------+---------------+---------+-----------+---------------+--------------+ Gastroc  None           No       No         softly         Acute                                                      echogenic                     +---------+---------------+---------+-----------+---------------+--------------+  GSV      None           No       No         softly         Acute                                                      echogenic                     +---------+---------------+---------+-----------+---------------+--------------+ SSV      None           No       No         softly         Age                                                        echogenic      Indeterminate  +---------+---------------+---------+-----------+---------------+--------------+ SPJ      None           No       No         softly         Acute                                                      echogenic                     +---------+---------------+---------+-----------+---------------+--------------+ TPT      None           No       No         softly         Acute                                                      echogenic                     +---------+---------------+---------+-----------+---------------+--------------+   Right Technical Findings: Acute non-occlusive thrombus in the distal external iliac vein. Acute occlusive thrombus noted in the SFJ,  GSV, proximal to distal FV, popliteal vein, gastrocnemius vein, TPT, PTV, popliteal vein and SPJ. Age Indeterminate occlusive thrombus noted in the  CFV, ostial FV, DFV and proximal SSV.  +---------+---------------+---------+-----------+---------------+--------------+ LEFT     CompressibilityPhasicitySpontaneityProperties     Thrombus Aging +---------+---------------+---------+-----------+---------------+--------------+ CFV      Full           Yes      Yes                                      +---------+---------------+---------+-----------+---------------+--------------+ SFJ      Full  Yes      Yes                                      +---------+---------------+---------+-----------+---------------+--------------+ FV Prox  Full           Yes      Yes                                      +---------+---------------+---------+-----------+---------------+--------------+ FV Mid   Full           Yes      Yes                                      +---------+---------------+---------+-----------+---------------+--------------+ FV DistalFull           Yes      Yes                                      +---------+---------------+---------+-----------+---------------+--------------+ PFV      None           No       No         softly         Acute                                                      echogenic                     +---------+---------------+---------+-----------+---------------+--------------+ POP      Full           Yes      Yes                                      +---------+---------------+---------+-----------+---------------+--------------+ PTV      None           No       No         softly         Acute                                                      echogenic                     +---------+---------------+---------+-----------+---------------+--------------+ PERO     None           No       No         softly          Acute  echogenic                     +---------+---------------+---------+-----------+---------------+--------------+ Soleal   None           No       No         softly         Acute                                                      echogenic                     +---------+---------------+---------+-----------+---------------+--------------+ Gastroc  Full           Yes      Yes                                      +---------+---------------+---------+-----------+---------------+--------------+ GSV      None           No       No         softly         Acute                                                      echogenic                     +---------+---------------+---------+-----------+---------------+--------------+ SSV      None           No       No         softly         Acute                                                      echogenic                     +---------+---------------+---------+-----------+---------------+--------------+ TPT      None           No       No         softly         Acute                                                      echogenic                     +---------+---------------+---------+-----------+---------------+--------------+  Left Technical Findings: Acute occlusive thrombus in the proximal GSV, not invading the SFJ (.37 cm from junction). Acute occlusive thrombus in the DFV, TPT, PTV, peroneal veins, SSV and soleal vein.  Findings reported to Dr. Gardiner Rhyme at 2:00 pm.  Summary: RIGHT: - Findings consistent with acute deep  vein thrombosis involving the SF junction, right femoral vein, right popliteal vein, right posterior tibial veins, right peroneal veins, right soleal veins, right gastrocnemius veins, and TPT and SPJ. - Findings consistent with acute superficial vein thrombosis involving the right great saphenous vein. - Findings consistent with  age indeterminate deep vein thrombosis involving the right common femoral vein, right femoral vein, and right proximal profunda vein. - Findings consistent with age indeterminate superficial vein thrombosis involving the right small saphenous vein. - No cystic structure found in the popliteal fossa. - Findings consistent with acute deep vein thrombosis involving the distal external iliac vein.  LEFT: - Findings consistent with acute deep vein thrombosis involving the left proximal profunda vein, left posterior tibial veins, left peroneal veins, left soleal veins, and TPT. - Findings consistent with acute superficial vein thrombosis involving the left great saphenous vein, and left small saphenous vein. - No cystic structure found in the popliteal fossa.  Incidental findings: Abnormal dilatation of the proximal abdominal aorta, measuring 3.2 cm AP x 3.2 cm TRV. Suggest follow-up for AAA duplex. *See table(s) above for measurements and observations. Electronically signed by Ida Rogue MD on 11/23/2020 at 6:44:57 PM.    Final     ASSESSMENT AND PLAN: This is a 75 year old male with metastatic non-small cell lung cancer initially diagnosed as stage IIIb non-small cell lung cancer, adenocarcinoma.  He presented with a right upper lobe lung mass in addition to right hilar and mediastinal lymphadenopathy diagnosed in August 2021 with negative actual mutation and PD-L1 expression in the range of 1 to 49%.  He declined enrollment in the alliance clinical trial for treatment with systemic chemotherapy +/- immunotherapy.  He underwent adjuvant systemic chemotherapy with cisplatin 75 mg per metered squared and Alimta 500 mg per metered squared every 3 weeks.  Status post 4 cycles.  He tolerated his treatment well with no concerning adverse effects.  He had evidence of disease recurrence in March 2022 with pleural-based metastasis that was biopsy-proven to be recurrent adenocarcinoma of the lung.  Molecular studies by  [facility] [email or web address] showed no actionable mutations.  He is currently undergoing systemic chemotherapy with carboplatin for an AUC of 5, Alimta 500 mg per metered squared, and Keytruda 200 mg IV every 3 weeks.  He is status post 4 cycles of treatment.  He has now developed extensive bilateral DVT.  The patient had been on warfarin as an outpatient with fluctuating INR.  Recommendation was for Lovenox 1 mg/kg twice daily for treatment of DVT.  The patient was scheduled for his next cycle of chemotherapy in our office today.  This has been rescheduled until 8/25.  The patient was advised to call our office if he has any questions or concerns in the interim.  The patient may discharge from our standpoint if otherwise medically stable.   LOS: 2 days   Mikey Bussing, DNP, AGPCNP-BC, AOCNP 11/25/20

## 2020-11-25 NOTE — Discharge Summary (Signed)
Physician Discharge Summary  Marcus [name] HYW:[id number] DOB: [birth year] DOA: 11/23/2020  PCP: [name], MD  Admit date: 11/23/2020 Discharge date: 11/25/2020  Admitted From: Home Disposition: Home  Recommendations for Outpatient Follow-up:  Follow up with PCP in 1-2 weeks Follow-up with cardiologist, Dr. Keene Breath in 2 weeks Follow-up with oncology, Dr. Julien Nordmann in 2 weeks Discontinued warfarin in favor of Lovenox 80 mg  BID for acute extensive bilateral DVT per oncology/cardiology recommendations  Home Health: No Equipment/Devices: None  Discharge Condition: Stable CODE STATUS: Full code Diet recommendation: Heart healthy diet  History of present illness:  Marcus [name] is a 75 year old male with past medical history significant for stage IIIB non-small cell lung cancer on chemotherapy, CAD s/p CABG, mechanical aortic valve repair on Coumadin, paroxysmal atrial fibrillation, essential hypertension, OSA who presented to [facility] ED on 8/16 from his cardiologist office where he was found to have right lower extremity swelling.  Patient on Coumadin outpatient with fluctuating INR.  At the cardiologist office, bilateral lower extremity duplex ultrasound showed extensive bilateral DVT.   In the ED, temperature 97.6 F, BP 142/80, HR 81, RR 20, SPO2 96% on room air.  WBC count 3.4, hemoglobin 9.7, platelets 122.  Sodium 136, potassium 3.6, chloride 103, CO2 26, creatinine 0.65.  INR 2.7.  Glucose 109.  COVID-19 PCR negative.  Influenza A/B PCR negative.  EKG with no ischemic findings.  Lower extremity Doppler ultrasound with acute DVT involving SF junction, right femoral vein, right popliteal vein, right posterior tibial veins, right peroneal veins and right soleal vein.  Also extensive DVT on the left lower extremity involving most of the left lower extremity veins.  TRH was consulted for further evaluation and management of acute DVT in the setting of presumed failed warfarin  anticoagulation.  Hospital course:  Extensive bilateral DVTs lower extremity Patient presents from cardiology office with right lower extremity swelling.  Vascular duplex ultrasound bilateral lower extremities remarkable for extensive DVT.  Patient with hypercoagulable state given his lung cancer.  Patient on Coumadin for proximal atrial fibrillation and mechanical aortic valve.  INR has been fluctuating, but INR on arrival 2.7.  Patient was initially started on heparin drip.  Discussed with his primary oncologist, Dr. Earlie Server who discussed with his cardiologist Dr. Sallyanne Kuster as well as consultation with cardiology, Dr. Marlou Porch who recommends transition to Lovenox and discontinuation of warfarin.  Heparin drip was transitioned to Lovenox.  Outpatient follow-up with oncology/cardiology.   CAD s/p CABG Stable no anginal symptoms.  Continue statin.   Mechanical aortic valve replacement Prior on warfarin.  Changing to Lovenox as above.  Continue outpatient follow-up with cardiology.   Stage IIIB non-small cell lung cancer Currently on chemotherapy.  Discussed with patient's primary oncologist, Dr. Julien Nordmann this morning regarding transition to Lovenox, which he is in agreement with.  Continue outpatient follow-up with oncology as scheduled.   Hyperlipidemia: Continue atorvastatin 80 mg p.o. daily   Essential hypertension Continue ramipril 10 mg p.o. daily   Anxiety: Continue alprazolam   OSA: Continue nocturnal CPAP   Hx COPD, not in exacerbation Oxygenating well on room air  Discharge Diagnoses:  Principal Problem:   DVT (deep venous thrombosis) (HCC) Active Problems:   H/O aortic valve replacement   CAD (coronary artery disease)   Hx of CABG   Hypercholesterolemia   Hypertension   Sleep apnea   COPD (chronic obstructive pulmonary disease) ([place])   Adenocarcinoma of right lung, stage 3 [facility])    Discharge Instructions  Discharge  Instructions     Call MD for:  difficulty  breathing, headache or visual disturbances   Complete by: As directed    Call MD for:  extreme fatigue   Complete by: As directed    Call MD for:  persistant dizziness or light-headedness   Complete by: As directed    Call MD for:  persistant nausea and vomiting   Complete by: As directed    Call MD for:  severe uncontrolled pain   Complete by: As directed    Call MD for:  temperature >100.4   Complete by: As directed    Diet - low sodium heart healthy   Complete by: As directed    Increase activity slowly   Complete by: As directed       Allergies as of 11/25/2020       Reactions   Amlodipine Anaphylaxis   Weakness and fatigue, also   Sulfa Antibiotics Shortness Of Breath   Tetanus Toxoids Anaphylaxis   Crestor [rosuvastatin] Other (See Comments)   Myalgia    Penicillins Hives   Has patient had a PCN reaction causing immediate rash, facial/tongue/throat swelling, SOB or lightheadedness with hypotension: No Has patient had a PCN reaction causing severe rash involving mucus membranes or skin necrosis: Yes Has patient had a PCN reaction that required hospitalization: No Has patient had a PCN reaction occurring within the last 10 years: No If all of the above answers are "NO", then may proceed with Cephalosporin use.        Medication List     STOP taking these medications    enoxaparin 120 MG/0.8ML injection Commonly known as: LOVENOX Replaced by: enoxaparin 80 MG/0.8ML injection   HYDROcodone-acetaminophen 5-325 MG tablet Commonly known as: NORCO/VICODIN   warfarin 10 MG tablet Commonly known as: COUMADIN       TAKE these medications    acetaminophen 500 MG tablet Commonly known as: TYLENOL Take 500 mg by mouth every 6 (six) hours as needed for mild pain or headache.   ALPRAZolam 0.25 MG tablet Commonly known as: XANAX Take 1 tablet (0.25 mg total) by mouth every 8 (eight) hours as needed for up to 20 doses for anxiety.   atorvastatin 80 MG  tablet Commonly known as: LIPITOR TAKE 1 TABLET BY MOUTH EVERY DAY What changed: when to take this   cetirizine 10 MG tablet Commonly known as: ZYRTEC Take 10 mg by mouth at bedtime.   dexamethasone 4 MG tablet Commonly known as: DECADRON Take 4 mg by mouth See admin instructions. Take 4 mg by mouth two times a day the day before, day of, and day after chemotherapy   enoxaparin 80 MG/0.8ML injection Commonly known as: LOVENOX Inject 0.8 mLs (80 mg total) into the skin 2 (two) times daily. Replaces: enoxaparin 120 HF/0.2OV injection   folic acid 1 MG tablet Commonly known as: FOLVITE TAKE 1 TABLET BY MOUTH EVERY DAY What changed: when to take this   furosemide 20 MG tablet Commonly known as: LASIX Take 20 mg by mouth as needed for edema.   lidocaine-prilocaine cream Commonly known as: EMLA Apply 1 application topically as needed. What changed: reasons to take this   ONE-A-DAY 55 PLUS PO Take 1 tablet by mouth daily with breakfast.   PreserVision AREDS 2 Caps Take 1 capsule by mouth daily with breakfast.   prochlorperazine 10 MG tablet Commonly known as: COMPAZINE Take 1 tablet (10 mg total) by mouth every 6 (six) hours as needed. What changed: reasons to  take this   QUNOL ULTRA COQ10 PO Take 1 capsule by mouth daily with breakfast.   ramipril 10 MG capsule Commonly known as: ALTACE TAKE 1 CAPSULE BY MOUTH EVERY DAY What changed: how much to take        Follow-up Information     [name], MD. Schedule an appointment as soon as possible for a visit in 1 week(s).   Specialty: Family Medicine Contact information: [address] [place] [address] [phone]         [name], [name], MD. Schedule an appointment as soon as possible for a visit in 2 week(s).   Specialty: Cardiology Contact information: [address] [place] [address] [phone]         Curt Bears, MD. Schedule an appointment as soon as  possible for a visit in 1 week(s).   Specialty: Oncology Contact information: [place] [address] [phone]                Allergies  Allergen Reactions   Amlodipine Anaphylaxis    Weakness and fatigue, also    Sulfa Antibiotics Shortness Of Breath   Tetanus Toxoids Anaphylaxis   Crestor [Rosuvastatin] Other (See Comments)    Myalgia    Penicillins Hives    Has patient had a PCN reaction causing immediate rash, facial/tongue/throat swelling, SOB or lightheadedness with hypotension: No Has patient had a PCN reaction causing severe rash involving mucus membranes or skin necrosis: Yes Has patient had a PCN reaction that required hospitalization: No Has patient had a PCN reaction occurring within the last 10 years: No If all of the above answers are "NO", then may proceed with Cephalosporin use.     Consultations: Medical oncology, Dr. Julien Nordmann Cardiology, Dr. Marlou Porch   Procedures/Studies: CT Chest W Contrast  Result Date: 11/02/2020 CLINICAL DATA:  Primary Cancer Type: Lung Imaging Indication: Assess response to therapy Interval therapy since last imaging? Yes Initial Cancer Diagnosis Date: 12/08/2019; Established by: Biopsy-proven Detailed Pathology: Stage IIIb non-small cell lung cancer, adenocarcinoma. Primary Tumor location: Right upper lobe. Recurrence? Yes; Date(s) of recurrence: 08/20/2020; Established by: Biopsy-proven Surgeries: Right upper lobectomy 12/08/2019.  Partial thyroidectomy. Chemotherapy: Yes; Ongoing? Yes; Most recent administration: 10/14/2020 Immunotherapy?  Yes; Type: Keytruda; Ongoing? Yes Radiation therapy? No EXAM: CT CHEST WITH CONTRAST TECHNIQUE: Multidetector CT imaging of the chest was performed during intravenous contrast administration. CONTRAST:  13mL OMNIPAQUE IOHEXOL 350 MG/ML SOLN COMPARISON:  08/20/2020 PET-CT. Most recent CT chest, abdomen and pelvis 08/01/2020. FINDINGS: Cardiovascular: Normal heart size. No  significant pericardial effusion/thickening. Aortic valve prosthesis in place. Three-vessel coronary atherosclerosis. Right internal jugular Port-A-Cath terminates at the cavoatrial junction. Atherosclerotic nonaneurysmal thoracic aorta. Top-normal caliber main pulmonary artery (3.0 cm diameter). No central pulmonary emboli. Mediastinum/Nodes: No discrete thyroid nodules. Unremarkable esophagus. No axillary adenopathy. Mildly enlarged 1.0 cm short axis diameter subcarinal node (series 2/image 76), stable from 1.0 cm on 08/01/2020 chest CT using similar measurement technique. Indistinct subcentimeter clustered nodularity in the high right internal mammary region (series 2/image 44), not substantially changed since 08/01/2020. There is a heterogeneously enhancing right pericardiophrenic 3.4 x 2.6 cm mass (series 2/image 116), increased from 2.1 x 1.2 cm on 08/20/2020 PET-CT and new since 08/01/2020 chest CT. No hilar adenopathy. Lungs/Pleura: No pneumothorax. Trace dependent right pleural effusion, mildly decreased from 08/20/2020 PET-CT. Scattered tiny subcentimeter right pleural nodules throughout the right pleural space, for example 0.4 cm in the posterior lower right pleural space (series 2/image  123) and 0.4 cm in the peripheral right mid pleural space (series 2/image 70), not discretely visualized on prior scans. A few scattered nodules along the right fissures, largest 0.7 cm (series 5/image 69), new. No left pleural effusion. Status post right upper lobectomy. Mild-to-moderate centrilobular and paraseptal emphysema. No acute consolidative airspace disease, lung masses or significant pulmonary nodules. Scattered mild interlobular septal thickening throughout right lung, most prominent in the superior right middle lobe, similar. Upper abdomen: Simple 2.7 cm posterior upper right renal cyst. Musculoskeletal: No aggressive appearing focal osseous lesions. Mild thoracic spondylosis. Intact sternotomy wires.  IMPRESSION: 1. Findings compatible with mild progression of metastatic disease in the right pleural space and right mediastinum. Widespread scattered subcentimeter right pleural nodularity appears new. New heterogeneously enhancing right pericardiophrenic mass. 2. Similar interlobular septal thickening in the right lung, compatible with lymphangitic carcinomatosis. 3. Similar subpleural mediastinal nodularity in the right internal mammary region compatible with metastatic disease. 4. Trace dependent right pleural effusion, mildly decreased from 08/20/2020 PET-CT. 5. Mild subcarinal adenopathy is stable. 6. Three-vessel coronary atherosclerosis. 7. Aortic Atherosclerosis (ICD10-I70.0) and Emphysema (ICD10-J43.9). Electronically Signed   By: Ilona Sorrel M.D.   On: 11/02/2020 12:04   DG Chest Port 1 View  Result Date: 11/23/2020 CLINICAL DATA:  History of lung cancer. EXAM: PORTABLE CHEST 1 VIEW COMPARISON:  Chest x-ray 09/07/2020 FINDINGS: The cardiac silhouette, mediastinal and hilar contours are within normal limits and stable. Stable surgical changes from cardiac surgery. Right IJ power port in good position without complicating features. Chronic pulmonary scarring changes.  Chronic right-sided pleural-. IMPRESSION: Chronic lung changes but no acute pulmonary findings. Electronically Signed   By: Marijo Sanes M.D.   On: 11/23/2020 17:24   VAS [place] LOWER EXTREMITY VENOUS (DVT)  Result Date: 11/23/2020  Lower Venous DVT Study Patient Name:  Marcus [name] [place]  Date of Exam:   11/23/2020 Medical Rec #: [id number]      Accession #:    [id number] Date of Birth: 23-Jan-1946       Patient Gender: M Patient Age:   75 years Exam Location:  [facility] Procedure:      VAS [place] LOWER EXTREMITY VENOUS (DVT) Referring Phys: [facility] [name] --------------------------------------------------------------------------------  Indications: Patient presents today with complaints of acute onset of right lower extremity swelling x 3 days, and  right groin pain x 1 day. He denies any symptoms involving the left lower extremity. He denies any SOB.  Risk Factors: Cancer of the lung and currently doing chemotherapy since August 2021. Anticoagulation: Coumadin.  Performing Technologist: Sharlett Iles RVT  Examination Guidelines: A complete evaluation includes B-mode imaging, spectral Doppler, color Doppler, and power Doppler as needed of all accessible portions of each vessel. Bilateral testing is considered an integral part of a complete examination. Limited examinations for reoccurring indications may be performed as noted. The reflux portion of the exam is performed with the patient in reverse Trendelenburg.  +---------+---------------+---------+-----------+---------------+--------------+ RIGHT    CompressibilityPhasicitySpontaneityProperties     Thrombus Aging +---------+---------------+---------+-----------+---------------+--------------+ CFV      None           No       No         softly         Age  echogenic and  Indeterminate                                              brightly                                                                  echogenic                     +---------+---------------+---------+-----------+---------------+--------------+ SFJ      None           No       No         softly         Acute                                                      echogenic                     +---------+---------------+---------+-----------+---------------+--------------+ FV Prox  None           No       No         softly         Age                                                        echogenic and  Indeterminate                                              brightly                      +---------+---------------+---------+-----------+---------------+--------------+ FV Mid   None           No       No         softly          Acute                                                      echogenic                     +---------+---------------+---------+-----------+---------------+--------------+ FV DistalNone           No       No         softly         Acute  echogenic                     +---------+---------------+---------+-----------+---------------+--------------+ PFV      None           No       No         softly         Age                                                        echogenic and  Indeterminate                                              brightly                                                                  echogenic                     +---------+---------------+---------+-----------+---------------+--------------+ POP      None           No       No         softly         Acute                                                      echogenic                     +---------+---------------+---------+-----------+---------------+--------------+ PTV      None           No       No         softly         Acute                                                      echogenic                     +---------+---------------+---------+-----------+---------------+--------------+ PERO     None           No       No         softly         Acute                                                      echogenic                     +---------+---------------+---------+-----------+---------------+--------------+  Soleal   None           No       No         softly         Acute                                                      echogenic                     +---------+---------------+---------+-----------+---------------+--------------+ Gastroc  None           No       No         softly         Acute                                                      echogenic                      +---------+---------------+---------+-----------+---------------+--------------+ GSV      None           No       No         softly         Acute                                                      echogenic                     +---------+---------------+---------+-----------+---------------+--------------+ SSV      None           No       No         softly         Age                                                        echogenic      Indeterminate  +---------+---------------+---------+-----------+---------------+--------------+ SPJ      None           No       No         softly         Acute                                                      echogenic                     +---------+---------------+---------+-----------+---------------+--------------+ TPT      None           No       No  softly         Acute                                                      echogenic                     +---------+---------------+---------+-----------+---------------+--------------+   Right Technical Findings: Acute non-occlusive thrombus in the distal external iliac vein. Acute occlusive thrombus noted in the SFJ, GSV, proximal to distal FV, popliteal vein, gastrocnemius vein, TPT, PTV, popliteal vein and SPJ. Age Indeterminate occlusive thrombus noted in the  CFV, ostial FV, DFV and proximal SSV.  +---------+---------------+---------+-----------+---------------+--------------+ LEFT     CompressibilityPhasicitySpontaneityProperties     Thrombus Aging +---------+---------------+---------+-----------+---------------+--------------+ CFV      Full           Yes      Yes                                      +---------+---------------+---------+-----------+---------------+--------------+ SFJ      Full           Yes      Yes                                       +---------+---------------+---------+-----------+---------------+--------------+ FV Prox  Full           Yes      Yes                                      +---------+---------------+---------+-----------+---------------+--------------+ FV Mid   Full           Yes      Yes                                      +---------+---------------+---------+-----------+---------------+--------------+ FV DistalFull           Yes      Yes                                      +---------+---------------+---------+-----------+---------------+--------------+ PFV      None           No       No         softly         Acute                                                      echogenic                     +---------+---------------+---------+-----------+---------------+--------------+ POP      Full           Yes      Yes                                      +---------+---------------+---------+-----------+---------------+--------------+  PTV      None           No       No         softly         Acute                                                      echogenic                     +---------+---------------+---------+-----------+---------------+--------------+ PERO     None           No       No         softly         Acute                                                      echogenic                     +---------+---------------+---------+-----------+---------------+--------------+ Soleal   None           No       No         softly         Acute                                                      echogenic                     +---------+---------------+---------+-----------+---------------+--------------+ Gastroc  Full           Yes      Yes                                      +---------+---------------+---------+-----------+---------------+--------------+ GSV      None           No       No         softly         Acute                                                       echogenic                     +---------+---------------+---------+-----------+---------------+--------------+ SSV      None           No       No         softly         Acute  echogenic                     +---------+---------------+---------+-----------+---------------+--------------+ TPT      None           No       No         softly         Acute                                                      echogenic                     +---------+---------------+---------+-----------+---------------+--------------+  Left Technical Findings: Acute occlusive thrombus in the proximal GSV, not invading the SFJ (.37 cm from junction). Acute occlusive thrombus in the DFV, TPT, PTV, peroneal veins, SSV and soleal vein.  Findings reported to Dr. Gardiner Rhyme at 2:00 pm.  Summary: RIGHT: - Findings consistent with acute deep vein thrombosis involving the SF junction, right femoral vein, right popliteal vein, right posterior tibial veins, right peroneal veins, right soleal veins, right gastrocnemius veins, and TPT and SPJ. - Findings consistent with acute superficial vein thrombosis involving the right great saphenous vein. - Findings consistent with age indeterminate deep vein thrombosis involving the right common femoral vein, right femoral vein, and right proximal profunda vein. - Findings consistent with age indeterminate superficial vein thrombosis involving the right small saphenous vein. - No cystic structure found in the popliteal fossa. - Findings consistent with acute deep vein thrombosis involving the distal external iliac vein.  LEFT: - Findings consistent with acute deep vein thrombosis involving the left proximal profunda vein, left posterior tibial veins, left peroneal veins, left soleal veins, and TPT. - Findings consistent with acute superficial vein thrombosis involving the left great  saphenous vein, and left small saphenous vein. - No cystic structure found in the popliteal fossa.  Incidental findings: Abnormal dilatation of the proximal abdominal aorta, measuring 3.2 cm AP x 3.2 cm TRV. Suggest follow-up for AAA duplex. *See table(s) above for measurements and observations. Electronically signed by Ida Rogue MD on 11/23/2020 at 6:44:57 PM.    Final      Subjective: Patient seen examined at bedside, resting comfortably in bedside chair.  Spouse present.  Reports small nosebleed this morning, otherwise resolved.  Discussed with patient and wife that limited options regarding anticoagulation with failed Coumadin outpatient with now extensive DVTs.  Switched to Lovenox yesterday.  Discussed need for close follow-up with cardiology and medical oncology.  No other questions or concerns at this time.  Denies headache, no fever/chills/night sweats, no nausea/vomiting/diarrhea, no chest pain, palpitations, no shortness of breath, no abdominal pain.  No acute events overnight per nursing staff.  Discharge Exam: Vitals:   11/24/20 2018 11/25/20 0400  BP: 101/62 112/66  Pulse: 65 69  Resp:  15  Temp:  98.6 F (37 C)  SpO2: 99% 97%   Vitals:   11/24/20 1600 11/24/20 1924 11/24/20 2018 11/25/20 0400  BP:  (!) 100/58 101/62 112/66  Pulse:  71 65 69  Resp:  15  15  Temp:  98.6 F (37 C)  98.6 F (37 C)  TempSrc:  Oral  Oral  SpO2:  100% 99% 97%  Weight: 74.4 kg     Height: 5\' 9"  (1.753 m)  General: Pt is alert, awake, not in acute distress Cardiovascular: RRR, S1/S2 +, noted mechanical click RUSB, no rubs, no gallops Respiratory: CTA bilaterally, no wheezing, no rhonchi, on room air Abdominal: Soft, NT, ND, bowel sounds + Extremities: no edema, no cyanosis    The results of significant diagnostics from this hospitalization (including imaging, microbiology, ancillary and laboratory) are listed below for reference.     Microbiology: Recent Results (from the past  240 hour(s))  Resp Panel by RT-PCR (Flu A&B, Covid) Nasopharyngeal Swab     Status: None   Collection Time: 11/23/20  4:35 PM   Specimen: Nasopharyngeal Swab; Nasopharyngeal(NP) swabs in vial transport medium  Result Value Ref Range Status   SARS Coronavirus 2 by RT PCR NEGATIVE NEGATIVE Final    Comment: (NOTE) SARS-CoV-2 target nucleic acids are NOT DETECTED.  The SARS-CoV-2 RNA is generally detectable in upper respiratory specimens during the acute phase of infection. The lowest concentration of SARS-CoV-2 viral copies this assay can detect is 138 copies/mL. A negative result does not preclude SARS-Cov-2 infection and should not be used as the sole basis for treatment or other patient management decisions. A negative result may occur with  improper specimen collection/handling, submission of specimen other than nasopharyngeal swab, presence of viral mutation(s) within the areas targeted by this assay, and inadequate number of viral copies(<138 copies/mL). A negative result must be combined with clinical observations, patient history, and epidemiological information. The expected result is Negative.  Fact Sheet for Patients:  EntrepreneurPulse.com.au  Fact Sheet for Healthcare Providers:  IncredibleEmployment.be  This test is no t yet approved or cleared by the [place] FDA and  has been authorized for detection and/or diagnosis of SARS-CoV-2 by FDA under an Emergency Use Authorization (EUA). This EUA will remain  in effect (meaning this test can be used) for the duration of the COVID-19 declaration under Section 564(b)(1) of the Act, 21 U.S.C.section 360bbb-3(b)(1), unless the authorization is terminated  or revoked sooner.       Influenza A by PCR NEGATIVE NEGATIVE Final   Influenza B by PCR NEGATIVE NEGATIVE Final    Comment: (NOTE) The Xpert Xpress SARS-CoV-2/FLU/RSV plus assay is intended as an aid in the diagnosis of influenza  from Nasopharyngeal swab specimens and should not be used as a sole basis for treatment. Nasal washings and aspirates are unacceptable for Xpert Xpress SARS-CoV-2/FLU/RSV testing.  Fact Sheet for Patients: EntrepreneurPulse.com.au  Fact Sheet for Healthcare Providers: IncredibleEmployment.be  This test is not yet approved or cleared by the [place] FDA and has been authorized for detection and/or diagnosis of SARS-CoV-2 by FDA under an Emergency Use Authorization (EUA). This EUA will remain in effect (meaning this test can be used) for the duration of the COVID-19 declaration under Section 564(b)(1) of the Act, 21 U.S.C. section 360bbb-3(b)(1), unless the authorization is terminated or revoked.  Performed at [facility], [place] [address], [place], [place] [address]      Labs: BNP (last 3 results) No results for input(s): BNP in the last 8760 hours. Basic Metabolic Panel: Recent Labs  Lab 11/18/20 1418 11/23/20 1630 11/24/20 0455  NA 138 136 137  K 4.3 3.6 3.9  CL 105 103 103  CO2 24 26 25   GLUCOSE 78 101* 97  BUN 15 21 19   CREATININE 0.75 0.65 0.74  CALCIUM 8.6* 8.2* 8.0*   Liver Function Tests: Recent Labs  Lab 11/18/20 1418 11/23/20 1630 11/24/20 0455  AST 29 27 24   ALT 23 19  16  ALKPHOS 98 94 83  BILITOT 0.5 1.0 0.8  PROT 6.5 6.7 5.9*  ALBUMIN 3.1* 3.2* 2.8*   No results for input(s): LIPASE, AMYLASE in the last 168 hours. No results for input(s): AMMONIA in the last 168 hours. CBC: Recent Labs  Lab 11/18/20 1418 11/23/20 1630 11/24/20 0455 11/25/20 0354  WBC 3.1* 3.4* 2.9* 2.7*  NEUTROABS 1.9 2.1  --   --   HGB 7.7* 9.7* 8.7* 8.5*  HCT 23.0* 30.0* 26.7* 25.8*  MCV 88.5 90.6 90.8 91.2  PLT 58* 122* 116* 135*   Cardiac Enzymes: No results for input(s): CKTOTAL, CKMB, CKMBINDEX, TROPONINI in the last 168 hours. BNP: Invalid input(s): POCBNP CBG: No results for input(s): GLUCAP in the  last 168 hours. D-Dimer No results for input(s): DDIMER in the last 72 hours. Hgb A1c No results for input(s): HGBA1C in the last 72 hours. Lipid Profile No results for input(s): CHOL, HDL, LDLCALC, TRIG, CHOLHDL, LDLDIRECT in the last 72 hours. Thyroid function studies No results for input(s): TSH, T4TOTAL, T3FREE, THYROIDAB in the last 72 hours.  Invalid input(s): FREET3 Anemia work up No results for input(s): VITAMINB12, FOLATE, FERRITIN, TIBC, IRON, RETICCTPCT in the last 72 hours. Urinalysis    Component Value Date/Time   COLORURINE YELLOW 08/20/2020 0957   APPEARANCEUR CLEAR 08/20/2020 0957   LABSPEC 1.020 08/20/2020 0957   PHURINE 5.5 08/20/2020 0957   GLUCOSEU NEGATIVE 08/20/2020 0957   HGBUR TRACE-INTACT (A) 08/20/2020 0957   BILIRUBINUR NEGATIVE 08/20/2020 0957   BILIRUBINUR negative 08/13/2020 0947   KETONESUR NEGATIVE 08/20/2020 0957   PROTEINUR Positive (A) 08/13/2020 0947   PROTEINUR NEGATIVE 08/01/2020 1134   UROBILINOGEN 0.2 08/20/2020 0957   NITRITE NEGATIVE 08/20/2020 0957   LEUKOCYTESUR NEGATIVE 08/20/2020 0957   Sepsis Labs Invalid input(s): PROCALCITONIN,  WBC,  LACTICIDVEN Microbiology Recent Results (from the past 240 hour(s))  Resp Panel by RT-PCR (Flu A&B, Covid) Nasopharyngeal Swab     Status: None   Collection Time: 11/23/20  4:35 PM   Specimen: Nasopharyngeal Swab; Nasopharyngeal(NP) swabs in vial transport medium  Result Value Ref Range Status   SARS Coronavirus 2 by RT PCR NEGATIVE NEGATIVE Final    Comment: (NOTE) SARS-CoV-2 target nucleic acids are NOT DETECTED.  The SARS-CoV-2 RNA is generally detectable in upper respiratory specimens during the acute phase of infection. The lowest concentration of SARS-CoV-2 viral copies this assay can detect is 138 copies/mL. A negative result does not preclude SARS-Cov-2 infection and should not be used as the sole basis for treatment or other patient management decisions. A negative result may occur  with  improper specimen collection/handling, submission of specimen other than nasopharyngeal swab, presence of viral mutation(s) within the areas targeted by this assay, and inadequate number of viral copies(<138 copies/mL). A negative result must be combined with clinical observations, patient history, and epidemiological information. The expected result is Negative.  Fact Sheet for Patients:  EntrepreneurPulse.com.au  Fact Sheet for Healthcare Providers:  IncredibleEmployment.be  This test is no t yet approved or cleared by the [place] FDA and  has been authorized for detection and/or diagnosis of SARS-CoV-2 by FDA under an Emergency Use Authorization (EUA). This EUA will remain  in effect (meaning this test can be used) for the duration of the COVID-19 declaration under Section 564(b)(1) of the Act, 21 U.S.C.section 360bbb-3(b)(1), unless the authorization is terminated  or revoked sooner.       Influenza A by PCR NEGATIVE NEGATIVE Final   Influenza B by PCR  NEGATIVE NEGATIVE Final    Comment: (NOTE) The Xpert Xpress SARS-CoV-2/FLU/RSV plus assay is intended as an aid in the diagnosis of influenza from Nasopharyngeal swab specimens and should not be used as a sole basis for treatment. Nasal washings and aspirates are unacceptable for Xpert Xpress SARS-CoV-2/FLU/RSV testing.  Fact Sheet for Patients: EntrepreneurPulse.com.au  Fact Sheet for Healthcare Providers: IncredibleEmployment.be  This test is not yet approved or cleared by the [place] FDA and has been authorized for detection and/or diagnosis of SARS-CoV-2 by FDA under an Emergency Use Authorization (EUA). This EUA will remain in effect (meaning this test can be used) for the duration of the COVID-19 declaration under Section 564(b)(1) of the Act, 21 U.S.C. section 360bbb-3(b)(1), unless the authorization is terminated  or revoked.  Performed at [facility], [place] [address] [place], [place] [address]      Time coordinating discharge: Over 30 minutes  SIGNED:   Lavana Huckeba J [place] DO  [facility] 11/25/2020, 11:04 AM

## 2020-11-25 NOTE — Progress Notes (Signed)
Patient discharged via wheelchair accompanied by wife and staff. Patient is alert and oriented, no distress. Discharge instruction and education given, pt. Verbalized understanding. All patient belongings are with the family.

## 2020-11-25 NOTE — Telephone Encounter (Signed)
ERROR

## 2020-11-26 ENCOUNTER — Other Ambulatory Visit (HOSPITAL_COMMUNITY): Payer: Self-pay

## 2020-11-29 ENCOUNTER — Ambulatory Visit: Payer: Self-pay | Admitting: Cardiology

## 2020-11-29 ENCOUNTER — Telehealth: Payer: Self-pay | Admitting: *Deleted

## 2020-11-29 ENCOUNTER — Telehealth: Payer: Self-pay | Admitting: Internal Medicine

## 2020-11-29 DIAGNOSIS — Z952 Presence of prosthetic heart valve: Secondary | ICD-10-CM

## 2020-11-29 DIAGNOSIS — Z79899 Other long term (current) drug therapy: Secondary | ICD-10-CM

## 2020-11-29 LAB — POCT INR: INR: 1.1 — AB (ref 2.0–3.0)

## 2020-11-29 NOTE — Telephone Encounter (Signed)
Rescheduled patient's cancelled appointments to 08/24 and 08/25, called and spoke to patient's wife. Patient will be notified.

## 2020-11-29 NOTE — Progress Notes (Signed)
Cardiology Office Note:    Date:  12/03/2020   ID:  Marcus [name], DOB Nov 26, 1945, MRN [id number]  PCP:  [name], MD  Cardiologist:  [name], MD  Electrophysiologist:  None   Referring MD: [name], MD   Chief Complaint: hospital follow-up for extensive bilateral DVTs  History of Present Illness:    Marcus [name] is a 75 y.o. male with a history of of CABG x2 (SVG-RCA and SVG-OM) in 2008, severe aortic insufficiency s/p mechanical AVR in 2008 at time of CABG previous only Coumadin but now on indefinite Lovenox therapy due to extensive bilateral DVTs while on Coumadin, post-op atrial fibrillation, asymptomatic bradycardia, ascending aortic aneurysm s/p Bentall repair in 2008, hypertension, hyperlipidemia, obstructive sleep apnea on CPAP, and recurrent stage IIIB lung adenocarcinoma currently receiving chemotherapy who is followed by Dr. Sallyanne Kuster and presents today for hospital follow-up for extensive bilateral DVTs.  Patient had multiple episodes of near syncope/syncope in fall of 2020. Monitor was ordered and showed normal sinus rhythm with typical normal circadian variation as well as fairly significant nocturnal bradycardia and tachycardia with activity. Also showed occasional bigeminy PACs but no atrial fibrillation, meaningful ventricular arrhythmias, or pauses. Near syncope/syncope felt to be vasovagal in nature and due to dehydration/hypovolemia. Myoview in 11/2019 was low risk with no evidence of ischemia. Last Echo in 09/2020 for routine monitoring of AVR and ascending aorta showed LVEF of 60-65% with normal wall motion and normal functioning of mechanical aortic valve with only trivial AI and mean gradient of 13.3 mmHg. Aortic root, ascending aorta, aortic arch, and descending were all normal as well with no evidence of dilatation or obstruction.  Patient has a history of stage IIIB non-small cell lung adenocarcinoma. Initially diagnosed in 11/2019 but found to  have recurrence with extensive pleural involvement in 07/2020. Followed by Dr. Julien Nordmann and currently receiving chemotherapy. Brain MRI in 08/2020 was negative for metastatic brain disease but did show innumerable areas of chronic microhemorrhage in both cerebral hemispheres with evidence of superficial siderosis unchanged from prior studies and compatible with cerebral amyloid.   He was last seen by Dr. Sallyanne Kuster in 09/2020 at which time he was doing well from a cardiac standpoint. Patient's wife called our office on 11/22/2020 with concern for lower extremity edema. Lower extremity venous dopplers ordered and performed the next days showing extensive bilateral deep vein thrombosis and superficial vein thrombosis. He was advised to proceed to the ED. He was admitted from 11/23/2020 to 11/25/2020 and started on Lovenox. Dr. Earlie Server discussed with Dr. Sallyanne Kuster as well as Dr. Marlou Porch (rounding MD those days) who all agreed with stopping Warfarin and transitioning to Lovenox indefinitely.   Patient presents today for follow-up. Here with his wife. Patient doing well from a cardiac standpoint. He had a chemo session yesterday so he is more tired today. He has chronic shortness of breath due to his lung cancer but this is stable.  No orthopnea or PND.  His lower extremity edema has improved greatly. No edema on the left leg but right leg is still swollen some.  Denies any chest pain.  He did have an episode of epigastric pain earlier this week that improved with Gas-X. No palpitations, lightheadedness, dizziness, syncope.  He is tolerating the Lovenox injections well with no abnormal bleeding.   Past Medical History:  Diagnosis Date   Atrial fibrillation ([place])    Dyspnea    with exertion    Dysrhythmia    Hypertension  Obstructive sleep apnea 10/25/2007   cpap   S/P AVR    2D ECHO, 04/25/2011 - EF >55%, Right ventricle-mild-moderately dilated   Swelling of limb    LEA VENOUS, 08/22/2009 - no evidence of deep  vein or superficial thrombosis; partially rupturing Baker's Cyst    Past Surgical History:  Procedure Laterality Date   CARDIAC CATHETERIZATION Bilateral 05/10/2007   Significant 1-vessel disease, severely dilated aortic root with moderate severe aortic insufficiency   CARDIAC SURGERY     CARDIOVERSION  08/02/2007   150 Joule biphasic shock with restoration of sinus rhythm. Heart rate 60.   cataract surgery      COLONOSCOPY WITH PROPOFOL N/A 12/15/2016   Procedure: COLONOSCOPY WITH PROPOFOL;  Surgeon: Carol Ada, MD;  Location: [facility];  Service: Endoscopy;  Laterality: N/A;   EYE SURGERY     INTERCOSTAL NERVE BLOCK Right 12/08/2019   Procedure: INTERCOSTAL NERVE BLOCK;  Surgeon: [name], MD;  Location: [place];  Service: Thoracic;  Laterality: Right;   IR IMAGING GUIDED PORT INSERTION  09/28/2020   IR THORACENTESIS ASP PLEURAL SPACE W/IMG GUIDE  08/05/2020   NM MYOVIEW LTD  04/08/2007   No evidence of inducible myocardial ischemia   NODE DISSECTION Right 12/08/2019   Procedure: NODE DISSECTION;  Surgeon: [name], MD;  Location: [place];  Service: Thoracic;  Laterality: Right;   THYROIDECTOMY, PARTIAL     torn meniscus in right knee surgery       Current Medications: Current Meds  Medication Sig   acetaminophen (TYLENOL) 500 MG tablet Take 500 mg by mouth every 6 (six) hours as needed for mild pain or headache.   ALPRAZolam (XANAX) 0.25 MG tablet TAKE 1 TABLET (0.25 MG TOTAL) BY MOUTH EVERY 8 (EIGHT) HOURS AS NEEDED FOR UP TO 20 DOSES FOR ANXIETY.   atorvastatin (LIPITOR) 80 MG tablet TAKE 1 TABLET BY MOUTH EVERY DAY (Patient taking differently: Take 80 mg by mouth at bedtime.)   cetirizine (ZYRTEC) 10 MG tablet Take 10 mg by mouth at bedtime.   Coenzyme Q10-Vitamin E (QUNOL ULTRA COQ10 PO) Take 1 capsule by mouth daily with breakfast.   dexamethasone (DECADRON) 4 MG tablet Take 4 mg by mouth See admin instructions. Take 4 mg by mouth two times a day the day  before, day of, and day after chemotherapy   enoxaparin (LOVENOX) 80 MG/0.8ML injection Inject 0.8 mLs (80 mg total) into the skin 2 (two) times daily.   folic acid (FOLVITE) 1 MG tablet TAKE 1 TABLET BY MOUTH EVERY DAY (Patient taking differently: Take 1 mg by mouth daily with breakfast.)   furosemide (LASIX) 20 MG tablet Take 20 mg by mouth as needed for edema.   lidocaine-prilocaine (EMLA) cream Apply 1 application topically as needed. (Patient taking differently: Apply 1 application topically as needed (as directed).)   Multiple Vitamin (ONE-A-DAY 55 PLUS PO) Take 1 tablet by mouth daily with breakfast.   Multiple Vitamins-Minerals (PRESERVISION AREDS 2) CAPS Take 1 capsule by mouth daily with breakfast.   prochlorperazine (COMPAZINE) 10 MG tablet Take 1 tablet (10 mg total) by mouth every 6 (six) hours as needed. (Patient taking differently: Take 10 mg by mouth every 6 (six) hours as needed for nausea or vomiting.)   ramipril (ALTACE) 10 MG capsule TAKE 1 CAPSULE BY MOUTH EVERY DAY (Patient taking differently: Take 10 mg by mouth daily.)     Allergies:   Amlodipine, Sulfa antibiotics, Tetanus toxoids, Crestor [rosuvastatin], and Penicillins   Social History  Socioeconomic History   Marital status: Married    Spouse name: diane   Number of children: Not on file   Years of education: Not on file   Highest education level: Not on file  Occupational History   Occupation: retired  Tobacco Use   Smoking status: Former    Types: Cigarettes    Quit date: 04/11/1987    Years since quitting: 33.6   Smokeless tobacco: Never  Vaping Use   Vaping Use: Never used  Substance and Sexual Activity   Alcohol use: No   Drug use: No   Sexual activity: Not on file  Other Topics Concern   Not on file  Social History Narrative   Not on file   Social Determinants of Health   Financial Resource Strain: Low Risk    Difficulty of Paying Living Expenses: Not hard at all  Food Insecurity: No Food  Insecurity   Worried About Charity fundraiser in the Last Year: Never true   [place] in the Last Year: Never true  Transportation Needs: No Transportation Needs   Lack of Transportation (Medical): No   Lack of Transportation (Non-Medical): No  Physical Activity: Insufficiently Active   Days of Exercise per Week: 3 days   Minutes of Exercise per Session: 40 min  Stress: No Stress Concern Present   Feeling of Stress : Not at all  Social Connections: Moderately Integrated   Frequency of Communication with Friends and Family: More than three times a week   Frequency of Social Gatherings with Friends and Family: Never   Attends Religious Services: 1 to 4 times per year   Active Member of [employer] or Organizations: No   Attends Archivist Meetings: Never   Marital Status: Married     Family History: The patient's family history includes Cancer in his father.  ROS:   Please see the history of present illness.     EKGs/Labs/Other Studies Reviewed:    The following studies were reviewed today:  Zio Monitor 11/2018: Dominant rhythm is normal sinus with typical normal circadian variation. At times there is fairly significant nocturnal bradycardia and there is tachycardia with activity. There is no evidence of atrial fibrillation or of any meaningful ventricular arrhythmia. No pauses are seen. There are occasional premature atrial complexes, many times in a pattern of bigeminy.   Mildly abnormal event monitor with periods of moderate sinus bradycardia at night and occasional atrial bigeminy. _______________  [facility] Adam Myoview 12/02/2019: Nuclear stress EF: 63%. There was no ST segment deviation noted during stress. No T wave inversion was noted during stress. The study is normal. This is a low risk study.   Low risk stress nuclear study with normal perfusion and normal left ventricular regional and global systolic function. _______________  Echocardiogram  09/09/2020: Impressions:  1. Left ventricular ejection fraction, by estimation, is 60 to 65%. The  left ventricle has normal function. The left ventricle has no regional  wall motion abnormalities. There is mild concentric left ventricular  hypertrophy. Left ventricular diastolic  parameters are indeterminate.   2. Right ventricular systolic function is normal. The right ventricular  size is normal.   3. The mitral valve is normal in structure. Trivial mitral valve  regurgitation. No evidence of mitral stenosis.   4. The aortic valve has been repaired/replaced. Aortic valve  regurgitation is trivial. There is a 23 mm St. Jude bileaflet valve  present in the aortic position. Aortic valve mean gradient measures 13.3  mmHg.   5. The inferior vena cava is normal in size with greater than 50%  respiratory variability, suggesting right atrial pressure of 3 mmHg.   Comparison(s): No significant change from prior study. _______________  Lower Extremity Venous Ultrasound 11/23/2020: Summary:  RIGHT:  - Findings consistent with acute deep vein thrombosis involving the SF  junction, right femoral vein, right popliteal vein, right posterior tibial  veins, right peroneal veins, right soleal veins, right gastrocnemius  veins, and TPT and SPJ.  - Findings consistent with acute superficial vein thrombosis involving the  right great saphenous vein.  - Findings consistent with age indeterminate deep vein thrombosis  involving the right common femoral vein, right femoral vein, and right  proximal profunda vein.  - Findings consistent with age indeterminate superficial vein thrombosis  involving the right small saphenous vein.  - No cystic structure found in the popliteal fossa.  - Findings consistent with acute deep vein thrombosis involving the distal  external iliac vein.     LEFT:  - Findings consistent with acute deep vein thrombosis involving the left  proximal profunda vein, left posterior  tibial veins, left peroneal veins,  left soleal veins, and TPT.  - Findings consistent with acute superficial vein thrombosis involving the  left great saphenous vein, and left small saphenous vein.  - No cystic structure found in the popliteal fossa.     Incidental findings: Abnormal dilatation of the proximal abdominal aorta,  measuring 3.2 cm AP x 3.2 cm TRV. Suggest follow-up for AAA duplex.   EKG:  EKG not ordered today.   Recent Labs: 05/07/2020: Magnesium 2.0 10/28/2020: TSH 1.132 12/01/2020: ALT 50; BUN 16; Creatinine 0.79; Hemoglobin 9.0; Platelet Count 184; Potassium 4.2; Sodium 135  Recent Lipid Panel    Component Value Date/Time   CHOL 170 08/13/2020 0951   CHOL 155 02/19/2020 0000   TRIG 106.0 08/13/2020 0951   HDL 41.60 08/13/2020 0951   HDL 42 02/19/2020 0000   CHOLHDL 4 08/13/2020 0951   VLDL 21.2 08/13/2020 0951   LDLCALC 107 (H) 08/13/2020 0951   LDLCALC 97 02/19/2020 0000    Physical Exam:    Vital Signs: BP 100/60 (BP Location: Right Arm, Patient Position: Sitting, Cuff Size: Normal)   Pulse 73   Ht 5\' 10"  (1.778 m)   Wt 162 lb 3.2 oz (73.6 kg)   SpO2 97%   BMI 23.27 kg/m     Wt Readings from Last 3 Encounters:  12/03/20 162 lb 3.2 oz (73.6 kg)  12/01/20 161 lb 14.4 oz (73.4 kg)  11/24/20 164 lb 1.6 oz (74.4 kg)     General: 75 y.o. male in no acute distress. HEENT: Normocephalic and atraumatic. Sclera clear. EOMs intact. Neck: Supple. No carotid bruits. No JVD. Heart: RRR with occasional ectopy. Distinct S1 and S2. Mechanical click present. Very soft murmur noted. Radial pulses 2+ and equal bilaterally. Lungs: No increased work of breathing. Clear to ausculation bilaterally. No wheezes, rhonchi, or rales.  Abdomen: Soft, non-distended, and non-tender to palpation.  Extremities: Trace to 1+ pitting edema of right lower extremity. No left lower extremity edema.  Skin: Warm and dry. Neuro: Alert and oriented x3. No focal deficits. Psych: Normal  affect. Responds appropriately.  Assessment:    1. Acute deep vein thrombosis (DVT) of both lower extremities, unspecified vein (HCC)   2. Coronary artery disease involving native coronary artery of native heart without angina pectoris   3. S/P CABG (coronary artery bypass graft)   4. Severe  aortic insufficiency s/p AVR   5. Ascending aortic aneurysm s/p Bentall repair   6. AAA (abdominal aortic aneurysm) without rupture ([place])   7. Postoperative atrial fibrillation (HCC)   8. Primary hypertension   9. Hyperlipidemia, unspecified hyperlipidemia type   10. Obstructive sleep apnea     Plan:    Extensive Bilateral DVTs - Dopplers on 8/16 showed extensive bilateral deep and superficial vein thrombosis. This occurred while on Coumadin. Patient was admitted to the hospital and started on Lovenox.  - Felt to be in higher/hypercoagulable state due to recurrent lung cancer. After discussion with primary Oncologist (Dr. Julien Nordmann) and Dr. Sallyanne Kuster, decision made to continue with Lovenox therapy indefinitely with mechanical valve. - Hemoglobin stable at 9.0 on recent labs on 12/01/2020.  CAD s/p CABG x2 in 2008 - Myoview in 11/2019 was low risk with no evidence of ischemia. - No angina.  - No aspirin with need for Lovenox. - Continue high-intensity statin.  Severe Aortic Insufficiency s/p Mechanical AVR in 2008 - Recent Echo in 09/2020 showed normal LV function with well functioning mechanical valve. Only trivial AI with mean gradient of 13.3 mmHg. - Plan is for Lovenox indefinitely given patient developed extensive DVTs while on Coumadin.  - Continue SBE prophylaxis prior to dental procedures.  Acending Aortic Aneurysm s/p Bentall Repair in 2008 - Last CTA in 10/2019 showed stable aortic graft repair and recent chest CT in 10/2020 showed atherosclerotic but nonaneurysmal thoracic aorta.  Possible AAA - Recent lower extremity doppler incidentally showed abnormal dilatation of the proximal  abdominal aorta measuring 3.2cm AP x 3.2 cm TRV. - Patient is scheduled to have full body CT within the month. Will wait to see results from CT. If CT report does not comment on size of abdominal aorta, will plan for AAA duplex.  Post-Op Atrial Fibrillation - Required DCCV in 2009 but no recurrence since that time. Monitor in 11/2018 did not show any evidence of atrial fibrillation. - Already on anticoagulation for mechanical AVR and DVT as above.  Hypertension - BP soft but stable at 100/60. Asymptomatic.  - Continue Ramipril 10mg  daily. Also on Lasix 20mg  daily as needed for edema.  Hyperlipidemia - Lipid panel in 08/2020 showed Total Cholesterol 170, Triglycerides 106, HDL 41.60, LDL 107.  - LDL goal <70 given CAD. - Continue Lipitor 80mg  daily. - Per Dr. Windle Guard last note in 09/2020, waiting for him to finish chemotherapy before adding more medications.   Obstructive Sleep Apnea - Compliant with CPAP. Continue. - Patient has not had a sleep study in several years. Previously followed by Dr. Claiborne Billings but patient would like to see Dr. Radford Pax going forward. Will see if we can arrange.  Disposition: Follow up with Dr. Sallyanne Kuster in 6 months.   Medication Adjustments/Labs and Tests Ordered: Current medicines are reviewed at length with the patient today.  Concerns regarding medicines are outlined above.  Orders Placed This Encounter  Procedures   VAS [place] AAA DUPLEX    No orders of the defined types were placed in this encounter.   Patient Instructions  Medication Instructions:  The current medical regimen is effective;  continue present plan and medications as directed. Please refer to the Current Medication list given to you today.  *If you need a refill on your cardiac medications before your next appointment, please call your pharmacy*  Lab Work: NONE  Testing/Procedures: Your physician has requested that you have an abdominal aorta duplex. During this test, an ultrasound is  used to evaluate  the aorta. Allow 30 minutes for this exam. Do not eat after midnight the day before and avoid carbonated beverages  Special Instructions FOLLOW UP FOR SLEEP WITH DR Lovena Le  Follow-Up: Your next appointment:  6 month(s) In Person with [name], MD    At [facility] you and your health needs are our priority.  As part of our continuing mission to provide you with exceptional heart care, we have created designated Provider Care Teams.  These Care Teams include your primary Cardiologist (physician) and Advanced Practice Providers (APPs -  Physician Assistants and Nurse Practitioners) who all work together to provide you with the care you need, when you need it.    Signed, Darreld Mclean, PA-C  12/03/2020 11:28 AM    [facility]

## 2020-11-29 NOTE — Telephone Encounter (Signed)
Patient of doctor Kelly's called and asked to be moved to see doctor Turner. Please advise if this is ok with the two of you.

## 2020-11-30 ENCOUNTER — Encounter: Payer: Self-pay | Admitting: Internal Medicine

## 2020-12-01 ENCOUNTER — Other Ambulatory Visit: Payer: Self-pay | Admitting: Family Medicine

## 2020-12-01 ENCOUNTER — Inpatient Hospital Stay: Payer: Medicare HMO

## 2020-12-01 ENCOUNTER — Inpatient Hospital Stay (HOSPITAL_BASED_OUTPATIENT_CLINIC_OR_DEPARTMENT_OTHER): Payer: Medicare HMO | Admitting: Internal Medicine

## 2020-12-01 ENCOUNTER — Ambulatory Visit: Payer: Medicare HMO | Admitting: General Practice

## 2020-12-01 ENCOUNTER — Other Ambulatory Visit: Payer: Self-pay

## 2020-12-01 VITALS — BP 138/75 | HR 79 | Temp 98.1°F | Resp 19 | Ht 69.0 in | Wt 161.9 lb

## 2020-12-01 DIAGNOSIS — C3490 Malignant neoplasm of unspecified part of unspecified bronchus or lung: Secondary | ICD-10-CM

## 2020-12-01 DIAGNOSIS — Z95828 Presence of other vascular implants and grafts: Secondary | ICD-10-CM

## 2020-12-01 DIAGNOSIS — Z5111 Encounter for antineoplastic chemotherapy: Secondary | ICD-10-CM | POA: Diagnosis present

## 2020-12-01 DIAGNOSIS — Z7901 Long term (current) use of anticoagulants: Secondary | ICD-10-CM | POA: Diagnosis not present

## 2020-12-01 DIAGNOSIS — Z79899 Other long term (current) drug therapy: Secondary | ICD-10-CM | POA: Diagnosis not present

## 2020-12-01 DIAGNOSIS — R531 Weakness: Secondary | ICD-10-CM | POA: Diagnosis not present

## 2020-12-01 DIAGNOSIS — R0602 Shortness of breath: Secondary | ICD-10-CM | POA: Diagnosis not present

## 2020-12-01 DIAGNOSIS — C3411 Malignant neoplasm of upper lobe, right bronchus or lung: Secondary | ICD-10-CM | POA: Diagnosis not present

## 2020-12-01 DIAGNOSIS — C782 Secondary malignant neoplasm of pleura: Secondary | ICD-10-CM | POA: Diagnosis not present

## 2020-12-01 DIAGNOSIS — I82403 Acute embolism and thrombosis of unspecified deep veins of lower extremity, bilateral: Secondary | ICD-10-CM | POA: Diagnosis not present

## 2020-12-01 DIAGNOSIS — R5383 Other fatigue: Secondary | ICD-10-CM | POA: Diagnosis not present

## 2020-12-01 DIAGNOSIS — Z5112 Encounter for antineoplastic immunotherapy: Secondary | ICD-10-CM | POA: Diagnosis present

## 2020-12-01 LAB — CBC WITH DIFFERENTIAL (CANCER CENTER ONLY)
Abs Immature Granulocytes: 0.07 10*3/uL (ref 0.00–0.07)
Basophils Absolute: 0 10*3/uL (ref 0.0–0.1)
Basophils Relative: 0 %
Eosinophils Absolute: 0 10*3/uL (ref 0.0–0.5)
Eosinophils Relative: 0 %
HCT: 27 % — ABNORMAL LOW (ref 39.0–52.0)
Hemoglobin: 9 g/dL — ABNORMAL LOW (ref 13.0–17.0)
Immature Granulocytes: 1 %
Lymphocytes Relative: 10 %
Lymphs Abs: 0.6 10*3/uL — ABNORMAL LOW (ref 0.7–4.0)
MCH: 30.2 pg (ref 26.0–34.0)
MCHC: 33.3 g/dL (ref 30.0–36.0)
MCV: 90.6 fL (ref 80.0–100.0)
Monocytes Absolute: 0.6 10*3/uL (ref 0.1–1.0)
Monocytes Relative: 11 %
Neutro Abs: 4.6 10*3/uL (ref 1.7–7.7)
Neutrophils Relative %: 78 %
Platelet Count: 184 10*3/uL (ref 150–400)
RBC: 2.98 MIL/uL — ABNORMAL LOW (ref 4.22–5.81)
RDW: 19.8 % — ABNORMAL HIGH (ref 11.5–15.5)
WBC Count: 5.9 10*3/uL (ref 4.0–10.5)
nRBC: 0 % (ref 0.0–0.2)

## 2020-12-01 LAB — CMP (CANCER CENTER ONLY)
ALT: 50 U/L — ABNORMAL HIGH (ref 0–44)
AST: 67 U/L — ABNORMAL HIGH (ref 15–41)
Albumin: 2.6 g/dL — ABNORMAL LOW (ref 3.5–5.0)
Alkaline Phosphatase: 119 U/L (ref 38–126)
Anion gap: 8 (ref 5–15)
BUN: 16 mg/dL (ref 8–23)
CO2: 25 mmol/L (ref 22–32)
Calcium: 8.7 mg/dL — ABNORMAL LOW (ref 8.9–10.3)
Chloride: 102 mmol/L (ref 98–111)
Creatinine: 0.79 mg/dL (ref 0.61–1.24)
GFR, Estimated: >60 mL/min (ref >60)
Glucose, Bld: 100 mg/dL — ABNORMAL HIGH (ref 70–99)
Potassium: 4.2 mmol/L (ref 3.5–5.1)
Sodium: 135 mmol/L (ref 135–145)
Total Bilirubin: 0.8 mg/dL (ref 0.3–1.2)
Total Protein: 6.7 g/dL (ref 6.5–8.1)

## 2020-12-01 MED ORDER — SODIUM CHLORIDE 0.9% FLUSH
10.0000 mL | Freq: Once | INTRAVENOUS | Status: AC
  Administered 2020-12-01: 10 mL

## 2020-12-01 MED ORDER — HEPARIN SOD (PORK) LOCK FLUSH 100 UNIT/ML IV SOLN
500.0000 [IU] | Freq: Once | INTRAVENOUS | Status: AC
  Administered 2020-12-01: 500 [IU]

## 2020-12-01 NOTE — Telephone Encounter (Signed)
LFD 08/13/20 #20 with no refills LOV 08/13/20 NOV 12/07/20

## 2020-12-01 NOTE — Addendum Note (Signed)
Addended by: Ardeen Garland on: 12/01/2020 11:54 AM   Modules accepted: Orders

## 2020-12-01 NOTE — Progress Notes (Signed)
[place] [phone]   [phone]  OFFICE PROGRESS NOTE  [name], MD [address] [place] [address]  DIAGNOSIS: Recurrent non-small cell lung cancer, adenocarcinoma initially diagnosed as stage IIIB (T3, N2, M0) non-small cell lung cancer, adenocarcinoma presented with right upper lobe lung mass in addition to right hilar and mediastinal lymphadenopathy. Diagnosed in December 08, 2019. In April 2022, he had signs of disease recurrence with extensive pleural involvement, likely extending into extrapleural fat, in the RIGHT chest compatible with disease recurrence and associated with nodal disease in the mediastinum. 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 Equivocal uptake in the LEFT adrenal gland.    Molecular Studies: Negative for actionable mutations   PDL1: 1-49%   PRIOR THERAPY:  1) Right upper lobectomy with lymph node sampling under the care of Dr. Kipp Brood on December 08, 2019. 2) Adjuvant systemic chemotherapy with Cisplatin 75 mg/m2 and Alimta 500 mg/m2 IV every 3 weeks. Last dose on 04/13/20.  Status post 4 cycles.   CURRENT THERAPY: Palliative systemic chemotherapy with carboplatin for an AUC of 5, Alimta, 500 mg/m2, and Keytruda 200 mg/m2. First dose expected on 09/01/20.  Status post 4 cycles.  Starting from cycle #5 the patient will be on maintenance treatment with Alimta and Keytruda every 3 weeks.  INTERVAL HISTORY: Marcus [name] 75 y.o. male returns to the clinic today for follow-up visit accompanied by his wife.  The patient continues to complain of increasing fatigue and weakness as well as shortness of breath with exertion.  He was recently diagnosed with extensive bilateral deep venous thrombosis of the lower extremities while on treatment with Coumadin because of the mechanical valve by his cardiologist.  The patient started treatment with Lovenox subcutaneously twice daily.   He continues to have swelling of the lower extremities.  He denied having any current chest pain, cough or hemoptysis.  He denied having any fever or chills.  He has no nausea, vomiting, diarrhea or constipation.  He has no headache or visual changes.  But he has a lot of confusion at times.  He is here today for evaluation before resuming his systemic chemotherapy tomorrow.  MEDICAL HISTORY: Past Medical History:  Diagnosis Date   Atrial fibrillation ([place])    Dyspnea    with exertion    Dysrhythmia    Hypertension    Obstructive sleep apnea 10/25/2007   cpap   S/P AVR    2D ECHO, 04/25/2011 - EF >55%, Right ventricle-mild-moderately dilated   Swelling of limb    LEA VENOUS, 08/22/2009 - no evidence of deep vein or superficial thrombosis; partially rupturing Baker's Cyst    ALLERGIES:  is allergic to amlodipine, sulfa antibiotics, tetanus toxoids, crestor [rosuvastatin], and penicillins.  MEDICATIONS:  Current Outpatient Medications  Medication Sig Dispense Refill   acetaminophen (TYLENOL) 500 MG tablet Take 500 mg by mouth every 6 (six) hours as needed for mild pain or headache.     ALPRAZolam (XANAX) 0.25 MG tablet Take 1 tablet (0.25 mg total) by mouth every 8 (eight) hours as needed for up to 20 doses for anxiety. 20 tablet 0   atorvastatin (LIPITOR) 80 MG tablet TAKE 1 TABLET BY MOUTH EVERY DAY (Patient taking differently: Take 80 mg by mouth at bedtime.) 90 tablet 2   cetirizine (ZYRTEC) 10 MG tablet Take 10 mg by mouth at bedtime.     Coenzyme Q10-Vitamin E (QUNOL ULTRA COQ10  PO) Take 1 capsule by mouth daily with breakfast.     dexamethasone (DECADRON) 4 MG tablet Take 4 mg by mouth See admin instructions. Take 4 mg by mouth two times a day the day before, day of, and day after chemotherapy     enoxaparin (LOVENOX) 80 MG/0.8ML injection Inject 0.8 mLs (80 mg total) into the skin 2 (two) times daily. 48 mL 2   folic acid (FOLVITE) 1 MG tablet TAKE 1 TABLET BY MOUTH EVERY DAY  (Patient taking differently: Take 1 mg by mouth daily with breakfast.) 30 tablet 2   furosemide (LASIX) 20 MG tablet Take 20 mg by mouth as needed for edema.     lidocaine-prilocaine (EMLA) cream Apply 1 application topically as needed. (Patient taking differently: Apply 1 application topically as needed (as directed).) 30 g 2   Multiple Vitamin (ONE-A-DAY 55 PLUS PO) Take 1 tablet by mouth daily with breakfast.     Multiple Vitamins-Minerals (PRESERVISION AREDS 2) CAPS Take 1 capsule by mouth daily with breakfast.     prochlorperazine (COMPAZINE) 10 MG tablet Take 1 tablet (10 mg total) by mouth every 6 (six) hours as needed. (Patient taking differently: Take 10 mg by mouth every 6 (six) hours as needed for nausea or vomiting.) 30 tablet 2   ramipril (ALTACE) 10 MG capsule TAKE 1 CAPSULE BY MOUTH EVERY DAY (Patient taking differently: Take 10 mg by mouth daily.) 90 capsule 3   No current facility-administered medications for this visit.    SURGICAL HISTORY:  Past Surgical History:  Procedure Laterality Date   CARDIAC CATHETERIZATION Bilateral 05/10/2007   Significant 1-vessel disease, severely dilated aortic root with moderate severe aortic insufficiency   CARDIAC SURGERY     CARDIOVERSION  08/02/2007   150 Joule biphasic shock with restoration of sinus rhythm. Heart rate 60.   cataract surgery      COLONOSCOPY WITH PROPOFOL N/A 12/15/2016   Procedure: COLONOSCOPY WITH PROPOFOL;  Surgeon: Carol Ada, MD;  Location: [facility];  Service: Endoscopy;  Laterality: N/A;   EYE SURGERY     INTERCOSTAL NERVE BLOCK Right 12/08/2019   Procedure: INTERCOSTAL NERVE BLOCK;  Surgeon: [name], MD;  Location: [facility];  Service: Thoracic;  Laterality: Right;   IR IMAGING GUIDED PORT INSERTION  09/28/2020   IR THORACENTESIS ASP PLEURAL SPACE W/IMG GUIDE  08/05/2020   NM MYOVIEW LTD  04/08/2007   No evidence of inducible myocardial ischemia   NODE DISSECTION Right 12/08/2019   Procedure: NODE  DISSECTION;  Surgeon: [name], MD;  Location: [facility];  Service: Thoracic;  Laterality: Right;   THYROIDECTOMY, PARTIAL     torn meniscus in right knee surgery       REVIEW OF SYSTEMS:  Constitutional: positive for fatigue Eyes: negative Ears, nose, mouth, throat, and face: negative Respiratory: positive for cough and dyspnea on exertion Cardiovascular: negative Gastrointestinal: negative Genitourinary:negative Integument/breast: negative Hematologic/lymphatic: negative Musculoskeletal:negative Neurological: negative Behavioral/Psych: negative Endocrine: negative Allergic/Immunologic: negative   PHYSICAL EXAMINATION: General appearance: alert, cooperative, fatigued, and no distress Head: Normocephalic, without obvious abnormality, atraumatic Neck: no adenopathy, no JVD, supple, symmetrical, trachea midline, and thyroid not enlarged, symmetric, no tenderness/mass/nodules Lymph nodes: Cervical, supraclavicular, and axillary nodes normal. Resp: clear to auscultation bilaterally Back: symmetric, no curvature. ROM normal. No CVA tenderness. Cardio: regular rate and rhythm, S1, S2 normal, no murmur, click, rub or gallop GI: soft, non-tender; bowel sounds normal; no masses,  no organomegaly Extremities: extremities normal, atraumatic, no cyanosis or edema Neurologic: Alert  and oriented X 3, normal strength and tone. Normal symmetric reflexes. Normal coordination and gait  ECOG PERFORMANCE STATUS: 1 - Symptomatic but completely ambulatory  Blood pressure 138/75, pulse 79, temperature 98.1 F (36.7 C), temperature source Oral, resp. rate 19, height 5' 9"  (1.753 m), weight 161 lb 14.4 oz (73.4 kg), SpO2 100 %.  LABORATORY DATA: Lab Results  Component Value Date   WBC 5.9 12/01/2020   HGB 9.0 (L) 12/01/2020   HCT 27.0 (L) 12/01/2020   MCV 90.6 12/01/2020   PLT 184 12/01/2020      Chemistry      Component Value Date/Time   NA 135 12/01/2020 0856   NA 139 10/28/2019  1057   K 4.2 12/01/2020 0856   CL 102 12/01/2020 0856   CO2 25 12/01/2020 0856   BUN 16 12/01/2020 0856   BUN 10 10/28/2019 1057   CREATININE 0.79 12/01/2020 0856      Component Value Date/Time   CALCIUM 8.7 (L) 12/01/2020 0856   ALKPHOS 119 12/01/2020 0856   AST 67 (H) 12/01/2020 0856   ALT 50 (H) 12/01/2020 0856   BILITOT 0.8 12/01/2020 0856       RADIOGRAPHIC STUDIES: CT Chest W Contrast  Result Date: 11/02/2020 CLINICAL DATA:  Primary Cancer Type: Lung Imaging Indication: Assess response to therapy Interval therapy since last imaging? Yes Initial Cancer Diagnosis Date: 12/08/2019; Established by: Biopsy-proven Detailed Pathology: Stage IIIb non-small cell lung cancer, adenocarcinoma. Primary Tumor location: Right upper lobe. Recurrence? Yes; Date(s) of recurrence: 08/20/2020; Established by: Biopsy-proven Surgeries: Right upper lobectomy 12/08/2019.  Partial thyroidectomy. Chemotherapy: Yes; Ongoing? Yes; Most recent administration: 10/14/2020 Immunotherapy?  Yes; Type: Keytruda; Ongoing? Yes Radiation therapy? No EXAM: CT CHEST WITH CONTRAST TECHNIQUE: Multidetector CT imaging of the chest was performed during intravenous contrast administration. CONTRAST:  37m OMNIPAQUE IOHEXOL 350 MG/ML SOLN COMPARISON:  08/20/2020 PET-CT. Most recent CT chest, abdomen and pelvis 08/01/2020. FINDINGS: Cardiovascular: Normal heart size. No significant pericardial effusion/thickening. Aortic valve prosthesis in place. Three-vessel coronary atherosclerosis. Right internal jugular Port-A-Cath terminates at the cavoatrial junction. Atherosclerotic nonaneurysmal thoracic aorta. Top-normal caliber main pulmonary artery (3.0 cm diameter). No central pulmonary emboli. Mediastinum/Nodes: No discrete thyroid nodules. Unremarkable esophagus. No axillary adenopathy. Mildly enlarged 1.0 cm short axis diameter subcarinal node (series 2/image 76), stable from 1.0 cm on 08/01/2020 chest CT using similar measurement  technique. Indistinct subcentimeter clustered nodularity in the high right internal mammary region (series 2/image 44), not substantially changed since 08/01/2020. There is a heterogeneously enhancing right pericardiophrenic 3.4 x 2.6 cm mass (series 2/image 116), increased from 2.1 x 1.2 cm on 08/20/2020 PET-CT and new since 08/01/2020 chest CT. No hilar adenopathy. Lungs/Pleura: No pneumothorax. Trace dependent right pleural effusion, mildly decreased from 08/20/2020 PET-CT. Scattered tiny subcentimeter right pleural nodules throughout the right pleural space, for example 0.4 cm in the posterior lower right pleural space (series 2/image 123) and 0.4 cm in the peripheral right mid pleural space (series 2/image 70), not discretely visualized on prior scans. A few scattered nodules along the right fissures, largest 0.7 cm (series 5/image 69), new. No left pleural effusion. Status post right upper lobectomy. Mild-to-moderate centrilobular and paraseptal emphysema. No acute consolidative airspace disease, lung masses or significant pulmonary nodules. Scattered mild interlobular septal thickening throughout right lung, most prominent in the superior right middle lobe, similar. Upper abdomen: Simple 2.7 cm posterior upper right renal cyst. Musculoskeletal: No aggressive appearing focal osseous lesions. Mild thoracic spondylosis. Intact sternotomy wires. IMPRESSION: 1. Findings compatible with mild  progression of metastatic disease in the right pleural space and right mediastinum. Widespread scattered subcentimeter right pleural nodularity appears new. New heterogeneously enhancing right pericardiophrenic mass. 2. Similar interlobular septal thickening in the right lung, compatible with lymphangitic carcinomatosis. 3. Similar subpleural mediastinal nodularity in the right internal mammary region compatible with metastatic disease. 4. Trace dependent right pleural effusion, mildly decreased from 08/20/2020 PET-CT. 5. Mild  subcarinal adenopathy is stable. 6. Three-vessel coronary atherosclerosis. 7. Aortic Atherosclerosis (ICD10-I70.0) and Emphysema (ICD10-J43.9). Electronically Signed   By: Ilona Sorrel M.D.   On: 11/02/2020 12:04   DG Chest Port 1 View  Result Date: 11/23/2020 CLINICAL DATA:  History of lung cancer. EXAM: PORTABLE CHEST 1 VIEW COMPARISON:  Chest x-ray 09/07/2020 FINDINGS: The cardiac silhouette, mediastinal and hilar contours are within normal limits and stable. Stable surgical changes from cardiac surgery. Right IJ power port in good position without complicating features. Chronic pulmonary scarring changes.  Chronic right-sided pleural-. IMPRESSION: Chronic lung changes but no acute pulmonary findings. Electronically Signed   By: Marijo Sanes M.D.   On: 11/23/2020 17:24   VAS [place] LOWER EXTREMITY VENOUS (DVT)  Result Date: 11/23/2020  Lower Venous DVT Study Patient Name:  Marcus [name]  Date of Exam:   11/23/2020 Medical Rec #: [id number]      Accession #:    [id number] Date of Birth: 09/23/1945       Patient Gender: M Patient Age:   75 years Exam Location:  [facility] Procedure:      VAS [place] LOWER EXTREMITY VENOUS (DVT) Referring Phys: [facility] [name] --------------------------------------------------------------------------------  Indications: Patient presents today with complaints of acute onset of right lower extremity swelling x 3 days, and right groin pain x 1 day. He denies any symptoms involving the left lower extremity. He denies any SOB.  Risk Factors: Cancer of the lung and currently doing chemotherapy since August 2021. Anticoagulation: Coumadin.  Performing Technologist: Sharlett Iles RVT  Examination Guidelines: A complete evaluation includes B-mode imaging, spectral Doppler, color Doppler, and power Doppler as needed of all accessible portions of each vessel. Bilateral testing is considered an integral part of a complete examination. Limited examinations for reoccurring indications may be performed  as noted. The reflux portion of the exam is performed with the patient in reverse Trendelenburg.  +---------+---------------+---------+-----------+---------------+--------------+ RIGHT    CompressibilityPhasicitySpontaneityProperties     Thrombus Aging +---------+---------------+---------+-----------+---------------+--------------+ CFV      None           No       No         softly         Age                                                        echogenic and  Indeterminate                                              brightly  echogenic                     +---------+---------------+---------+-----------+---------------+--------------+ SFJ      None           No       No         softly         Acute                                                      echogenic                     +---------+---------------+---------+-----------+---------------+--------------+ FV Prox  None           No       No         softly         Age                                                        echogenic and  Indeterminate                                              brightly                      +---------+---------------+---------+-----------+---------------+--------------+ FV Mid   None           No       No         softly         Acute                                                      echogenic                     +---------+---------------+---------+-----------+---------------+--------------+ FV DistalNone           No       No         softly         Acute                                                      echogenic                     +---------+---------------+---------+-----------+---------------+--------------+ PFV      None           No       No         softly         Age  echogenic and   Indeterminate                                              brightly                                                                  echogenic                     +---------+---------------+---------+-----------+---------------+--------------+ POP      None           No       No         softly         Acute                                                      echogenic                     +---------+---------------+---------+-----------+---------------+--------------+ PTV      None           No       No         softly         Acute                                                      echogenic                     +---------+---------------+---------+-----------+---------------+--------------+ PERO     None           No       No         softly         Acute                                                      echogenic                     +---------+---------------+---------+-----------+---------------+--------------+ Soleal   None           No       No         softly         Acute                                                      echogenic                     +---------+---------------+---------+-----------+---------------+--------------+  Gastroc  None           No       No         softly         Acute                                                      echogenic                     +---------+---------------+---------+-----------+---------------+--------------+ GSV      None           No       No         softly         Acute                                                      echogenic                     +---------+---------------+---------+-----------+---------------+--------------+ SSV      None           No       No         softly         Age                                                        echogenic      Indeterminate   +---------+---------------+---------+-----------+---------------+--------------+ SPJ      None           No       No         softly         Acute                                                      echogenic                     +---------+---------------+---------+-----------+---------------+--------------+ TPT      None           No       No         softly         Acute                                                      echogenic                     +---------+---------------+---------+-----------+---------------+--------------+   Right Technical Findings: Acute non-occlusive thrombus in the distal external iliac vein. Acute occlusive thrombus noted in the SFJ, GSV, proximal to  distal FV, popliteal vein, gastrocnemius vein, TPT, PTV, popliteal vein and SPJ. Age Indeterminate occlusive thrombus noted in the  CFV, ostial FV, DFV and proximal SSV.  +---------+---------------+---------+-----------+---------------+--------------+ LEFT     CompressibilityPhasicitySpontaneityProperties     Thrombus Aging +---------+---------------+---------+-----------+---------------+--------------+ CFV      Full           Yes      Yes                                      +---------+---------------+---------+-----------+---------------+--------------+ SFJ      Full           Yes      Yes                                      +---------+---------------+---------+-----------+---------------+--------------+ FV Prox  Full           Yes      Yes                                      +---------+---------------+---------+-----------+---------------+--------------+ FV Mid   Full           Yes      Yes                                      +---------+---------------+---------+-----------+---------------+--------------+ FV DistalFull           Yes      Yes                                      +---------+---------------+---------+-----------+---------------+--------------+ PFV       None           No       No         softly         Acute                                                      echogenic                     +---------+---------------+---------+-----------+---------------+--------------+ POP      Full           Yes      Yes                                      +---------+---------------+---------+-----------+---------------+--------------+ PTV      None           No       No         softly         Acute  echogenic                     +---------+---------------+---------+-----------+---------------+--------------+ PERO     None           No       No         softly         Acute                                                      echogenic                     +---------+---------------+---------+-----------+---------------+--------------+ Soleal   None           No       No         softly         Acute                                                      echogenic                     +---------+---------------+---------+-----------+---------------+--------------+ Gastroc  Full           Yes      Yes                                      +---------+---------------+---------+-----------+---------------+--------------+ GSV      None           No       No         softly         Acute                                                      echogenic                     +---------+---------------+---------+-----------+---------------+--------------+ SSV      None           No       No         softly         Acute                                                      echogenic                     +---------+---------------+---------+-----------+---------------+--------------+ TPT      None           No       No         softly         Acute  echogenic                      +---------+---------------+---------+-----------+---------------+--------------+  Left Technical Findings: Acute occlusive thrombus in the proximal GSV, not invading the SFJ (.37 cm from junction). Acute occlusive thrombus in the DFV, TPT, PTV, peroneal veins, SSV and soleal vein.  Findings reported to Dr. Gardiner Rhyme at 2:00 pm.  Summary: RIGHT: - Findings consistent with acute deep vein thrombosis involving the SF junction, right femoral vein, right popliteal vein, right posterior tibial veins, right peroneal veins, right soleal veins, right gastrocnemius veins, and TPT and SPJ. - Findings consistent with acute superficial vein thrombosis involving the right great saphenous vein. - Findings consistent with age indeterminate deep vein thrombosis involving the right common femoral vein, right femoral vein, and right proximal profunda vein. - Findings consistent with age indeterminate superficial vein thrombosis involving the right small saphenous vein. - No cystic structure found in the popliteal fossa. - Findings consistent with acute deep vein thrombosis involving the distal external iliac vein.  LEFT: - Findings consistent with acute deep vein thrombosis involving the left proximal profunda vein, left posterior tibial veins, left peroneal veins, left soleal veins, and TPT. - Findings consistent with acute superficial vein thrombosis involving the left great saphenous vein, and left small saphenous vein. - No cystic structure found in the popliteal fossa.  Incidental findings: Abnormal dilatation of the proximal abdominal aorta, measuring 3.2 cm AP x 3.2 cm TRV. Suggest follow-up for AAA duplex. *See table(s) above for measurements and observations. Electronically signed by Ida Rogue MD on 11/23/2020 at 6:44:57 PM.    Final     ASSESSMENT AND PLAN: This is a very pleasant 75 years old white male with metastatic non-small cell lung cancer initially diagnosed as stage IIIb non-small cell lung cancer,  adenocarcinoma presented with right upper lobe lung mass in addition to right hilar and mediastinal lymphadenopathy diagnosed in August 2021 with negative actionable mutation and PD-L1 expression in the range of 1-49%. The patient declined enrollment in the alliance clinical trial for treatment with systemic chemotherapy plus/minus immunotherapy. He underwent adjuvant systemic chemotherapy with cisplatin 75 mg/M2 and Alimta 500 mg/M2 every 3 weeks.  Status post 4 cycles.  He tolerated his treatment fairly well with no concerning adverse effects. The patient had evidence for disease recurrence in March 2022 with pleural-based metastasis that was biopsy-proven to be recurrent adenocarcinoma of the lung. He had molecular studies by [facility] [employer] that showed no actionable mutations. The patient is currently undergoing systemic chemotherapy with carboplatin for AUC of 5, Alimta 500 Mg/M2 and Keytruda 200 Mg IV every 3 weeks status post 4 cycles.  The patient continues to tolerate this treatment well with no concerning adverse effect except for the recent diagnosis of the deep venous thrombosis.  He is currently on Lovenox twice daily for the deep venous thrombosis and the mechanical valve. I recommended for the patient to proceed with cycle #5 tomorrow as planned. I will see the patient back for follow-up visit in 3 weeks for evaluation with repeat CT scan of the head, chest, abdomen and pelvis for restaging of his disease. The patient was advised to call immediately if he has any concerning symptoms in the interval. The patient voices understanding of current disease status and treatment options and is in agreement with the current care plan.  All questions were answered. The patient knows to call the clinic with any problems, questions or concerns. We can certainly see the patient  much sooner if necessary.  The total time spent in the appointment was 35 minutes.  Disclaimer: This note was dictated with  voice recognition software. Similar sounding words can inadvertently be transcribed and may not be corrected upon review.

## 2020-12-02 ENCOUNTER — Other Ambulatory Visit: Payer: Self-pay | Admitting: Internal Medicine

## 2020-12-02 ENCOUNTER — Inpatient Hospital Stay: Payer: Medicare HMO

## 2020-12-02 VITALS — BP 124/62 | HR 77 | Temp 97.8°F | Resp 18

## 2020-12-02 DIAGNOSIS — I82403 Acute embolism and thrombosis of unspecified deep veins of lower extremity, bilateral: Secondary | ICD-10-CM | POA: Diagnosis not present

## 2020-12-02 DIAGNOSIS — C3411 Malignant neoplasm of upper lobe, right bronchus or lung: Secondary | ICD-10-CM | POA: Diagnosis not present

## 2020-12-02 DIAGNOSIS — Z79899 Other long term (current) drug therapy: Secondary | ICD-10-CM | POA: Diagnosis not present

## 2020-12-02 DIAGNOSIS — C782 Secondary malignant neoplasm of pleura: Secondary | ICD-10-CM | POA: Diagnosis not present

## 2020-12-02 DIAGNOSIS — Z7901 Long term (current) use of anticoagulants: Secondary | ICD-10-CM | POA: Diagnosis not present

## 2020-12-02 DIAGNOSIS — Z5111 Encounter for antineoplastic chemotherapy: Secondary | ICD-10-CM | POA: Diagnosis not present

## 2020-12-02 DIAGNOSIS — R0602 Shortness of breath: Secondary | ICD-10-CM | POA: Diagnosis not present

## 2020-12-02 DIAGNOSIS — R5383 Other fatigue: Secondary | ICD-10-CM | POA: Diagnosis not present

## 2020-12-02 DIAGNOSIS — C3490 Malignant neoplasm of unspecified part of unspecified bronchus or lung: Secondary | ICD-10-CM

## 2020-12-02 DIAGNOSIS — R531 Weakness: Secondary | ICD-10-CM | POA: Diagnosis not present

## 2020-12-02 DIAGNOSIS — Z5112 Encounter for antineoplastic immunotherapy: Secondary | ICD-10-CM | POA: Diagnosis not present

## 2020-12-02 MED ORDER — SODIUM CHLORIDE 0.9 % IV SOLN
200.0000 mg | Freq: Once | INTRAVENOUS | Status: AC
  Administered 2020-12-02: 200 mg via INTRAVENOUS
  Filled 2020-12-02: qty 8

## 2020-12-02 MED ORDER — PROCHLORPERAZINE MALEATE 10 MG PO TABS
10.0000 mg | ORAL_TABLET | Freq: Once | ORAL | Status: AC
  Administered 2020-12-02: 10 mg via ORAL
  Filled 2020-12-02: qty 1

## 2020-12-02 MED ORDER — SODIUM CHLORIDE 0.9 % IV SOLN: Freq: Once | INTRAVENOUS | Status: AC

## 2020-12-02 MED ORDER — SODIUM CHLORIDE 0.9% FLUSH
10.0000 mL | INTRAVENOUS | Status: DC | PRN
  Administered 2020-12-02: 10 mL

## 2020-12-02 MED ORDER — SODIUM CHLORIDE 0.9 % IV SOLN
500.0000 mg/m2 | Freq: Once | INTRAVENOUS | Status: AC
  Administered 2020-12-02: 1000 mg via INTRAVENOUS
  Filled 2020-12-02: qty 40

## 2020-12-02 MED ORDER — HEPARIN SOD (PORK) LOCK FLUSH 100 UNIT/ML IV SOLN
500.0000 [IU] | Freq: Once | INTRAVENOUS | Status: AC | PRN
  Administered 2020-12-02: 500 [IU]

## 2020-12-02 NOTE — Patient Instructions (Signed)
[facility]  Discharge Instructions: Thank you for choosing [facility] to provide your oncology and hematology care.   If you have a lab appointment with the [facility], please go directly to the [facility] and check in at the registration area.   Wear comfortable clothing and clothing appropriate for easy access to any Portacath or PICC line.   We strive to give you quality time with your provider. You may need to reschedule your appointment if you arrive late (15 or more minutes).  Arriving late affects you and other patients whose appointments are after yours.  Also, if you miss three or more appointments without notifying the office, you may be dismissed from the clinic at the provider's discretion.      For prescription refill requests, have your pharmacy contact our office and allow 72 hours for refills to be completed.    Today you received the following chemotherapy and/or immunotherapy agents keytruda/alimta      To help prevent nausea and vomiting after your treatment, we encourage you to take your nausea medication as directed.  BELOW ARE SYMPTOMS THAT SHOULD BE REPORTED IMMEDIATELY: *FEVER GREATER THAN 100.4 F (38 C) OR HIGHER *CHILLS OR SWEATING *NAUSEA AND VOMITING THAT IS NOT CONTROLLED WITH YOUR NAUSEA MEDICATION *UNUSUAL SHORTNESS OF BREATH *UNUSUAL BRUISING OR BLEEDING *URINARY PROBLEMS (pain or burning when urinating, or frequent urination) *BOWEL PROBLEMS (unusual diarrhea, constipation, pain near the anus) TENDERNESS IN MOUTH AND THROAT WITH OR WITHOUT PRESENCE OF ULCERS (sore throat, sores in mouth, or a toothache) UNUSUAL RASH, SWELLING OR PAIN  UNUSUAL VAGINAL DISCHARGE OR ITCHING   Items with * indicate a potential emergency and should be followed up as soon as possible or go to the Emergency Department if any problems should occur.  Please show the CHEMOTHERAPY ALERT CARD or IMMUNOTHERAPY ALERT CARD at  check-in to the Emergency Department and triage nurse.  Should you have questions after your visit or need to cancel or reschedule your appointment, please contact [facility]  Dept: [phone]  and follow the prompts.  Office hours are 8:00 a.m. to 4:30 p.m. Monday - Friday. Please note that voicemails left after 4:00 p.m. may not be returned until the following business day.  We are closed weekends and major holidays. You have access to a nurse at all times for urgent questions. Please call the main number to the clinic Dept: [phone] and follow the prompts.   For any non-urgent questions, you may also contact your provider using MyChart. We now offer e-Visits for anyone 68 and older to request care online for non-urgent symptoms. For details visit [email or web address].   Also download the MyChart app! Go to the app store, search "MyChart", open the app, select [facility], and log in with your MyChart username and password.  Due to Covid, a mask is required upon entering the hospital/clinic. If you do not have a mask, one will be given to you upon arrival. For doctor visits, patients may have 1 support person aged 74 or older with them. For treatment visits, patients cannot have anyone with them due to current Covid guidelines and our immunocompromised population.

## 2020-12-03 ENCOUNTER — Ambulatory Visit (INDEPENDENT_AMBULATORY_CARE_PROVIDER_SITE_OTHER): Payer: Medicare HMO | Admitting: Student

## 2020-12-03 ENCOUNTER — Encounter: Payer: Self-pay | Admitting: Student

## 2020-12-03 ENCOUNTER — Other Ambulatory Visit: Payer: Self-pay

## 2020-12-03 VITALS — BP 100/60 | HR 73 | Ht 70.0 in | Wt 162.2 lb

## 2020-12-03 DIAGNOSIS — I2510 Atherosclerotic heart disease of native coronary artery without angina pectoris: Secondary | ICD-10-CM | POA: Diagnosis not present

## 2020-12-03 DIAGNOSIS — G4733 Obstructive sleep apnea (adult) (pediatric): Secondary | ICD-10-CM | POA: Diagnosis not present

## 2020-12-03 DIAGNOSIS — E785 Hyperlipidemia, unspecified: Secondary | ICD-10-CM

## 2020-12-03 DIAGNOSIS — I9789 Other postprocedural complications and disorders of the circulatory system, not elsewhere classified: Secondary | ICD-10-CM | POA: Diagnosis not present

## 2020-12-03 DIAGNOSIS — I10 Essential (primary) hypertension: Secondary | ICD-10-CM | POA: Diagnosis not present

## 2020-12-03 DIAGNOSIS — I712 Thoracic aortic aneurysm, without rupture: Secondary | ICD-10-CM

## 2020-12-03 DIAGNOSIS — I714 Abdominal aortic aneurysm, without rupture, unspecified: Secondary | ICD-10-CM

## 2020-12-03 DIAGNOSIS — I82403 Acute embolism and thrombosis of unspecified deep veins of lower extremity, bilateral: Secondary | ICD-10-CM | POA: Diagnosis not present

## 2020-12-03 DIAGNOSIS — I7121 Aneurysm of the ascending aorta, without rupture: Secondary | ICD-10-CM

## 2020-12-03 DIAGNOSIS — I351 Nonrheumatic aortic (valve) insufficiency: Secondary | ICD-10-CM

## 2020-12-03 DIAGNOSIS — Z951 Presence of aortocoronary bypass graft: Secondary | ICD-10-CM | POA: Diagnosis not present

## 2020-12-03 DIAGNOSIS — I4891 Unspecified atrial fibrillation: Secondary | ICD-10-CM

## 2020-12-03 NOTE — Patient Instructions (Signed)
Medication Instructions:  The current medical regimen is effective;  continue present plan and medications as directed. Please refer to the Current Medication list given to you today.  *If you need a refill on your cardiac medications before your next appointment, please call your pharmacy*  Lab Work: NONE  Testing/Procedures: Your physician has requested that you have an abdominal aorta duplex. During this test, an ultrasound is used to evaluate the aorta. Allow 30 minutes for this exam. Do not eat after midnight the day before and avoid carbonated beverages  Special Instructions FOLLOW UP FOR SLEEP WITH DR Lovena Le  Follow-Up: Your next appointment:  6 month(s) In Person with [name], MD    At [facility], you and your health needs are our priority.  As part of our continuing mission to provide you with exceptional heart care, we have created designated Provider Care Teams.  These Care Teams include your primary Cardiologist (physician) and Advanced Practice Providers (APPs -  Physician Assistants and Nurse Practitioners) who all work together to provide you with the care you need, when you need it.

## 2020-12-06 ENCOUNTER — Emergency Department (HOSPITAL_COMMUNITY): Payer: Medicare HMO

## 2020-12-06 ENCOUNTER — Telehealth: Payer: Self-pay | Admitting: Student

## 2020-12-06 ENCOUNTER — Inpatient Hospital Stay (HOSPITAL_COMMUNITY)
Admission: EM | Admit: 2020-12-06 | Discharge: 2020-12-12 | DRG: 392 | Disposition: A | Discharge status: Home Health | Payer: Medicare HMO | Attending: Internal Medicine | Admitting: Internal Medicine

## 2020-12-06 ENCOUNTER — Telehealth: Payer: Self-pay | Admitting: Medical Oncology

## 2020-12-06 ENCOUNTER — Encounter (HOSPITAL_COMMUNITY): Payer: Self-pay

## 2020-12-06 ENCOUNTER — Telehealth: Payer: Self-pay | Admitting: Pharmacist

## 2020-12-06 ENCOUNTER — Other Ambulatory Visit: Payer: Self-pay

## 2020-12-06 DIAGNOSIS — R1084 Generalized abdominal pain: Secondary | ICD-10-CM

## 2020-12-06 DIAGNOSIS — Z952 Presence of prosthetic heart valve: Secondary | ICD-10-CM

## 2020-12-06 DIAGNOSIS — I10 Essential (primary) hypertension: Secondary | ICD-10-CM | POA: Diagnosis present

## 2020-12-06 DIAGNOSIS — C3490 Malignant neoplasm of unspecified part of unspecified bronchus or lung: Secondary | ICD-10-CM | POA: Diagnosis not present

## 2020-12-06 DIAGNOSIS — Z8679 Personal history of other diseases of the circulatory system: Secondary | ICD-10-CM

## 2020-12-06 DIAGNOSIS — R079 Chest pain, unspecified: Secondary | ICD-10-CM | POA: Diagnosis not present

## 2020-12-06 DIAGNOSIS — Z7901 Long term (current) use of anticoagulants: Secondary | ICD-10-CM

## 2020-12-06 DIAGNOSIS — Z951 Presence of aortocoronary bypass graft: Secondary | ICD-10-CM | POA: Diagnosis not present

## 2020-12-06 DIAGNOSIS — Z79899 Other long term (current) drug therapy: Secondary | ICD-10-CM

## 2020-12-06 DIAGNOSIS — I82409 Acute embolism and thrombosis of unspecified deep veins of unspecified lower extremity: Secondary | ICD-10-CM | POA: Diagnosis not present

## 2020-12-06 DIAGNOSIS — R000 Tachycardia, unspecified: Secondary | ICD-10-CM | POA: Diagnosis not present

## 2020-12-06 DIAGNOSIS — K668 Other specified disorders of peritoneum: Secondary | ICD-10-CM | POA: Diagnosis not present

## 2020-12-06 DIAGNOSIS — K810 Acute cholecystitis: Secondary | ICD-10-CM

## 2020-12-06 DIAGNOSIS — R188 Other ascites: Secondary | ICD-10-CM | POA: Diagnosis not present

## 2020-12-06 DIAGNOSIS — R7401 Elevation of levels of liver transaminase levels: Secondary | ICD-10-CM | POA: Diagnosis present

## 2020-12-06 DIAGNOSIS — K4090 Unilateral inguinal hernia, without obstruction or gangrene, not specified as recurrent: Secondary | ICD-10-CM | POA: Diagnosis present

## 2020-12-06 DIAGNOSIS — I82401 Acute embolism and thrombosis of unspecified deep veins of right lower extremity: Secondary | ICD-10-CM

## 2020-12-06 DIAGNOSIS — I2510 Atherosclerotic heart disease of native coronary artery without angina pectoris: Secondary | ICD-10-CM | POA: Diagnosis present

## 2020-12-06 DIAGNOSIS — C3491 Malignant neoplasm of unspecified part of right bronchus or lung: Secondary | ICD-10-CM | POA: Diagnosis not present

## 2020-12-06 DIAGNOSIS — D6481 Anemia due to antineoplastic chemotherapy: Secondary | ICD-10-CM | POA: Diagnosis not present

## 2020-12-06 DIAGNOSIS — R109 Unspecified abdominal pain: Secondary | ICD-10-CM | POA: Diagnosis not present

## 2020-12-06 DIAGNOSIS — I714 Abdominal aortic aneurysm, without rupture: Secondary | ICD-10-CM | POA: Diagnosis present

## 2020-12-06 DIAGNOSIS — Z880 Allergy status to penicillin: Secondary | ICD-10-CM | POA: Diagnosis not present

## 2020-12-06 DIAGNOSIS — G4733 Obstructive sleep apnea (adult) (pediatric): Secondary | ICD-10-CM | POA: Diagnosis present

## 2020-12-06 DIAGNOSIS — Z515 Encounter for palliative care: Secondary | ICD-10-CM | POA: Diagnosis not present

## 2020-12-06 DIAGNOSIS — T451X5A Adverse effect of antineoplastic and immunosuppressive drugs, initial encounter: Secondary | ICD-10-CM | POA: Diagnosis present

## 2020-12-06 DIAGNOSIS — Z66 Do not resuscitate: Secondary | ICD-10-CM | POA: Diagnosis present

## 2020-12-06 DIAGNOSIS — R0602 Shortness of breath: Secondary | ICD-10-CM | POA: Diagnosis not present

## 2020-12-06 DIAGNOSIS — F32A Depression, unspecified: Secondary | ICD-10-CM | POA: Diagnosis present

## 2020-12-06 DIAGNOSIS — J449 Chronic obstructive pulmonary disease, unspecified: Secondary | ICD-10-CM | POA: Diagnosis present

## 2020-12-06 DIAGNOSIS — K8066 Calculus of gallbladder and bile duct with acute and chronic cholecystitis without obstruction: Secondary | ICD-10-CM | POA: Diagnosis not present

## 2020-12-06 DIAGNOSIS — R531 Weakness: Secondary | ICD-10-CM | POA: Diagnosis not present

## 2020-12-06 DIAGNOSIS — R1011 Right upper quadrant pain: Secondary | ICD-10-CM | POA: Diagnosis not present

## 2020-12-06 DIAGNOSIS — N281 Cyst of kidney, acquired: Secondary | ICD-10-CM | POA: Diagnosis not present

## 2020-12-06 DIAGNOSIS — I4891 Unspecified atrial fibrillation: Secondary | ICD-10-CM | POA: Diagnosis present

## 2020-12-06 DIAGNOSIS — Z888 Allergy status to other drugs, medicaments and biological substances status: Secondary | ICD-10-CM | POA: Diagnosis not present

## 2020-12-06 DIAGNOSIS — Z809 Family history of malignant neoplasm, unspecified: Secondary | ICD-10-CM

## 2020-12-06 DIAGNOSIS — R918 Other nonspecific abnormal finding of lung field: Secondary | ICD-10-CM | POA: Diagnosis not present

## 2020-12-06 DIAGNOSIS — K5730 Diverticulosis of large intestine without perforation or abscess without bleeding: Secondary | ICD-10-CM | POA: Diagnosis not present

## 2020-12-06 DIAGNOSIS — J410 Simple chronic bronchitis: Secondary | ICD-10-CM | POA: Diagnosis not present

## 2020-12-06 DIAGNOSIS — E785 Hyperlipidemia, unspecified: Secondary | ICD-10-CM | POA: Diagnosis not present

## 2020-12-06 DIAGNOSIS — D500 Iron deficiency anemia secondary to blood loss (chronic): Secondary | ICD-10-CM | POA: Diagnosis not present

## 2020-12-06 DIAGNOSIS — Z85118 Personal history of other malignant neoplasm of bronchus and lung: Secondary | ICD-10-CM | POA: Diagnosis not present

## 2020-12-06 DIAGNOSIS — Z20822 Contact with and (suspected) exposure to covid-19: Secondary | ICD-10-CM | POA: Diagnosis present

## 2020-12-06 DIAGNOSIS — Z87891 Personal history of nicotine dependence: Secondary | ICD-10-CM

## 2020-12-06 DIAGNOSIS — R69 Illness, unspecified: Secondary | ICD-10-CM | POA: Diagnosis not present

## 2020-12-06 DIAGNOSIS — R627 Adult failure to thrive: Secondary | ICD-10-CM | POA: Diagnosis not present

## 2020-12-06 DIAGNOSIS — Z7189 Other specified counseling: Secondary | ICD-10-CM | POA: Diagnosis not present

## 2020-12-06 DIAGNOSIS — G4730 Sleep apnea, unspecified: Secondary | ICD-10-CM | POA: Diagnosis present

## 2020-12-06 DIAGNOSIS — R7989 Other specified abnormal findings of blood chemistry: Secondary | ICD-10-CM

## 2020-12-06 DIAGNOSIS — Z86718 Personal history of other venous thrombosis and embolism: Secondary | ICD-10-CM | POA: Diagnosis not present

## 2020-12-06 DIAGNOSIS — Z9889 Other specified postprocedural states: Secondary | ICD-10-CM | POA: Diagnosis not present

## 2020-12-06 HISTORY — DX: Acute embolism and thrombosis of unspecified deep veins of unspecified lower extremity: I82.409

## 2020-12-06 LAB — CBC
HCT: 25.6 % — ABNORMAL LOW (ref 39.0–52.0)
Hemoglobin: 8.4 g/dL — ABNORMAL LOW (ref 13.0–17.0)
MCH: 30.3 pg (ref 26.0–34.0)
MCHC: 32.8 g/dL (ref 30.0–36.0)
MCV: 92.4 fL (ref 80.0–100.0)
Platelets: 203 10*3/uL (ref 150–400)
RBC: 2.77 MIL/uL — ABNORMAL LOW (ref 4.22–5.81)
RDW: 20.6 % — ABNORMAL HIGH (ref 11.5–15.5)
WBC: 14.5 10*3/uL — ABNORMAL HIGH (ref 4.0–10.5)
nRBC: 0 % (ref 0.0–0.2)

## 2020-12-06 LAB — URINALYSIS, ROUTINE W REFLEX MICROSCOPIC
Bilirubin Urine: NEGATIVE
Glucose, UA: NEGATIVE mg/dL
Hgb urine dipstick: NEGATIVE
Ketones, ur: 5 mg/dL — AB
Leukocytes,Ua: NEGATIVE
Nitrite: NEGATIVE
Protein, ur: NEGATIVE mg/dL
Specific Gravity, Urine: 1.043 — ABNORMAL HIGH (ref 1.005–1.030)
pH: 6 (ref 5.0–8.0)

## 2020-12-06 LAB — TROPONIN I (HIGH SENSITIVITY)
Troponin I (High Sensitivity): 15 ng/L (ref <18)
Troponin I (High Sensitivity): 16 ng/L (ref <18)

## 2020-12-06 LAB — HEPATIC FUNCTION PANEL
ALT: 226 U/L — ABNORMAL HIGH (ref 0–44)
AST: 139 U/L — ABNORMAL HIGH (ref 15–41)
Albumin: 2.6 g/dL — ABNORMAL LOW (ref 3.5–5.0)
Alkaline Phosphatase: 153 U/L — ABNORMAL HIGH (ref 38–126)
Bilirubin, Direct: 0.3 mg/dL — ABNORMAL HIGH (ref 0.0–0.2)
Indirect Bilirubin: 1 mg/dL — ABNORMAL HIGH (ref 0.3–0.9)
Total Bilirubin: 1.3 mg/dL — ABNORMAL HIGH (ref 0.3–1.2)
Total Protein: 6.6 g/dL (ref 6.5–8.1)

## 2020-12-06 LAB — BASIC METABOLIC PANEL
Anion gap: 10 (ref 5–15)
BUN: 25 mg/dL — ABNORMAL HIGH (ref 8–23)
CO2: 24 mmol/L (ref 22–32)
Calcium: 8.2 mg/dL — ABNORMAL LOW (ref 8.9–10.3)
Chloride: 98 mmol/L (ref 98–111)
Creatinine, Ser: 0.93 mg/dL (ref 0.61–1.24)
GFR, Estimated: >60 mL/min (ref >60)
Glucose, Bld: 95 mg/dL (ref 70–99)
Potassium: 3.9 mmol/L (ref 3.5–5.1)
Sodium: 132 mmol/L — ABNORMAL LOW (ref 135–145)

## 2020-12-06 LAB — APTT: aPTT: 54 seconds — ABNORMAL HIGH (ref 24–36)

## 2020-12-06 LAB — RESP PANEL BY RT-PCR (FLU A&B, COVID) ARPGX2
Influenza A by PCR: NEGATIVE
Influenza B by PCR: NEGATIVE
SARS Coronavirus 2 by RT PCR: NEGATIVE

## 2020-12-06 LAB — PROCALCITONIN: Procalcitonin: 0.67 ng/mL

## 2020-12-06 LAB — PROTIME-INR
INR: 1.1 (ref 0.8–1.2)
Prothrombin Time: 14.6 seconds (ref 11.4–15.2)

## 2020-12-06 MED ORDER — MORPHINE SULFATE (PF) 4 MG/ML IV SOLN
6.0000 mg | Freq: Once | INTRAVENOUS | Status: AC
  Administered 2020-12-06: 6 mg via INTRAVENOUS
  Filled 2020-12-06: qty 2

## 2020-12-06 MED ORDER — HYDROMORPHONE HCL 1 MG/ML IJ SOLN
0.5000 mg | Freq: Once | INTRAMUSCULAR | Status: AC
  Administered 2020-12-06: 0.5 mg via INTRAVENOUS
  Filled 2020-12-06: qty 1

## 2020-12-06 MED ORDER — IOHEXOL 350 MG/ML SOLN
100.0000 mL | Freq: Once | INTRAVENOUS | Status: AC | PRN
  Administered 2020-12-06: 100 mL via INTRAVENOUS

## 2020-12-06 MED ORDER — ONDANSETRON HCL 4 MG/2ML IJ SOLN
4.0000 mg | Freq: Once | INTRAMUSCULAR | Status: AC
  Administered 2020-12-06: 4 mg via INTRAVENOUS
  Filled 2020-12-06: qty 2

## 2020-12-06 MED ORDER — SODIUM CHLORIDE 0.9 % IV SOLN: 2.0000 g | INTRAVENOUS | Status: DC

## 2020-12-06 MED ORDER — LACTATED RINGERS IV BOLUS
1000.0000 mL | Freq: Once | INTRAVENOUS | Status: AC
  Administered 2020-12-06: 1000 mL via INTRAVENOUS

## 2020-12-06 MED ORDER — SODIUM CHLORIDE 0.9 % IV SOLN
2.0000 g | Freq: Once | INTRAVENOUS | Status: AC
  Administered 2020-12-06: 2 g via INTRAVENOUS
  Filled 2020-12-06: qty 20

## 2020-12-06 MED ORDER — HYDROMORPHONE HCL 1 MG/ML IJ SOLN
0.5000 mg | INTRAMUSCULAR | Status: DC | PRN
  Administered 2020-12-07 – 2020-12-08 (×5): 0.5 mg via INTRAVENOUS
  Administered 2020-12-09 – 2020-12-10 (×2): 1 mg via INTRAVENOUS
  Filled 2020-12-06 (×8): qty 1

## 2020-12-06 MED ORDER — HEPARIN (PORCINE) 25000 UT/250ML-% IV SOLN
1150.0000 [IU]/h | INTRAVENOUS | Status: DC
  Administered 2020-12-06: 1300 [IU]/h via INTRAVENOUS
  Administered 2020-12-07: 1150 [IU]/h via INTRAVENOUS
  Filled 2020-12-06 (×3): qty 250

## 2020-12-06 MED ORDER — LACTATED RINGERS IV SOLN: INTRAVENOUS | Status: DC

## 2020-12-06 NOTE — ED Provider Notes (Signed)
5:09 PM Care assumed from Dr. Zenia Resides.  At time of transfer care, we are waiting surgical consultation for concern of acute cholecystitis.  Patient has right upper quadrant abdominal pain, gallbladder wall thickening on CT, sludge on ultrasound, and LFT elevation with a white count.  Given his complex medical picture, anticipate patient may need admission to a medicine service was surgical consultation following.  Anticipate reassessment after surgery conversation.  5:24 PM Just spoke with general surgery who will see the patient in consultation but they have a lower suspicion that this is an acute cholecystitis causing his symptoms today.  They do feel he would benefit most from a medicine admission.  I will place a admission order for the medicine team.   Marcus [name], Marcus Allegra, MD 12/07/20 [phone]

## 2020-12-06 NOTE — Telephone Encounter (Signed)
New message:     Patient wife calling stating that the patient is having server blood clotting and can not eat or drink. Please advise.

## 2020-12-06 NOTE — Consult Note (Addendum)
Marcus [name]  Sep 09, 1945 [id number]  CARE TEAM:  PCP: [name], MD  Outpatient Care Team: Patient Care Team: [name], MD as PCP - General (Family Medicine) [name], [name], MD as PCP - Cardiology (Cardiology) [name], MD as Consulting Physician (Cardiothoracic Surgery) Curt Bears, MD as Consulting Physician (Oncology) Collene Gobble, MD as Consulting Physician (Pulmonary Disease) Carol Ada, MD as Consulting Physician (Gastroenterology) Madelin Rear, [facility] as Pharmacist (Pharmacist)  Inpatient Treatment Team: Treatment Team: Attending Provider: Mendel Corning, MD; Technician: Tora Kindred, [place]; Registered Nurse: Althea Charon, RN; Consulting Physician: Edison Pace, Md, MD   This patient is a 75 y.o.male who presents today for surgical evaluation at the request of Dr Tegeler for Dr. Zenia Resides.   Chief complaint / Reason for evaluation: Abdominal pain with gallbladder wall edema.  Question cholecystitis  Patient with multiple medical problems.  Diagnosed with non-small cell lung cancer.  Diagnosed August 2021.  Treated with right upper lobectomy and lymph node sending to Dr. Kipp Brood.  Followed by Dr. Earlie Server with medical oncology with chemotherapy.  April 2022 had this signs of disease recurrence with extensive pleural involvement in the right chest.  Mediastinal nodal disease.  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  Last seen in oncology office 5 days ago with fatigue and weakness and shortness of breath.  He was diagnosed with bilateral DVTs rather extensive.  Was already on Coumadin for history of mechanical valve.  Transition to Lovenox shots.  Lower extremity swelling.  Apparently struggles with confusion as well.  Undergoing systemic chemotherapy with carboplatin for AUC of 5, Alimta 500 Mg/M2 and Keytruda 200 Mg IV every 3 weeks status post 5 cycles, last dose 4 days ago, 8/25.  Apparently patient felt worse through the  weekend and was rather bedridden.  Worsening pain.  Wife called the oncology office Monday morning concerned.  They recommended going the emergency department.  Elevated white count.  Borderline elevated troponin.  CT scan done which confirmed persistent DVT.  Small but stable aneurysm.  Some gallbladder wall thickening.  They favor reactivity since he has left upper quadrant omental caking.  Based on concerns, surgical consultation requested  Wife at bedside.  Patient complains of pain on chest wall and abdomen.  Wife wonders if it is more in the right upper side the patient notes its left upper side and right lower side as well.  Has a known inguinal hernia on the left side that is sensitive but is reducible.  Patient had a lot of loose stools and diarrhea yesterday.  No sick contacts or travel.  No vomiting but decreased appetite overall.  Patient tired and not the best historian.  Wife does not recall episodes of certain food triggers like biliary colic.   Assessment  Marcus [name]  75 y.o. male       Problem List:  Active Problems:   Adenocarcinoma of right lung, stage 3 (HCC)   H/O aortic valve replacement   S/P thoracic aortic aneurysm repair   Hx of CABG   Hypertension   Sleep apnea   COPD (chronic obstructive pulmonary disease) (HCC)   Adenocarcinoma of lung, stage 3 (HCC)   Recurrent non-small cell lung cancer (HCC)   DVT (deep venous thrombosis) (HCC)   Acute abdominal pain   Diffuse abdominal and chest wall pain in a patient with metastatic lung cancer with moderate progression on palliative chemotherapy.  Concern for left upper quadrant omental caking.  Mild gallbladder wall thickening in the setting of ascites.  Seems more incidental to me.  No great story for biliary colic but some discomfort in right upper quadrant.  Reducible inguinal hernia.  Fully anticoagulated on Lovenox for recurrent DVTs in the setting of Coumadin anticoagulation from distant history of aortic  valve.  Plan:  I am skeptical that this patient truly has cholecystitis.  I suspect that the gallbladder is slightly thickened and atypical because he has some ascites and probable omental caking from progressive lung cancer.  Radiology notes no obvious cholecystitis on ultrasound.  Just sludge.  Because he does have discomfort in that region with decreased appetite and failure to thrive, I recommend a confirmatory test with a nuclear medicine HIDA scan study.  See if it is possible to do tonight.  If truly positive, I would do percutaneous drainage of the gallbladder.  He is not a good candidate for surgery in the setting of 5 days from palliative chemotherapy, very deconditioned, chemotherapy 5 days ago.  Risk of leak and other problems rather high.  If he truly has omental caking, that is an ominous sign and max surgery risks of leak hernia and dehiscence much higher.  His wife agrees that he is not a great candidate for emergency surgery right now but appreciates consultation and input from myself as well as nursing, emergency and internal medicine.  Reasonable to place on antibiotics.  Appears to have tolerated first dose of ceftriaxone.  He does have a left inguinal hernia but is reducible.  Does not appear to be a transition point on CAT scan.  Would not repair at this time.  He does have aneurysms and dissections in the aorta or iliacs region but these seem relatively stable.  There is no indication for emergency surgery tonight.  Reasonable to continue Lovenox full anticoagulation tonight and hold in the morning to determine if he needs percutaneous drainage or not.  Medical oncology evaluation in the morning.  I think it makes sense to have palliative care consult for goals of care given his deterioration and deconditioned state.  Do not know how aggressive they wish to be in the setting.  Would like input from medical oncology and ideally primary care who know this patient  better.  -VTE prophylaxis- SCDs, etc  -mobilize as tolerated to help recovery  45 minutes spent in review, evaluation, examination, counseling, and coordination of care.  More than 50% of that time was spent in counseling.  Discussed with the patient especially his wife at bedside.  Discussed with admitting Dr. Tana Coast as well  Adin Hector, MD, FACS, MASCRS Esophageal, Gastrointestinal & Colorectal Surgery Robotic and Minimally Invasive Surgery  [facility], [place]. [address] [place], [place] [address] [phone] Fax [phone] Main  CONTACT INFORMATION:  Weekday (9AM-5PM): Call [facility] main office at [phone]  Weeknight (5PM-9AM) or Weekend/Holiday: Check [email or web address] (password " [email or web address]") for General Surgery [facility] coverage  (Please, do not use SecureChat as it is not reliable communication to operating surgeons for immediate patient care)      12/06/2020      Past Medical History:  Diagnosis Date   Atrial fibrillation ([place])    DVT (deep venous thrombosis) ([place])    Dyspnea    with exertion    Dysrhythmia    Hypertension    Obstructive sleep apnea 10/25/2007   cpap   S/P AVR    2D ECHO, 04/25/2011 -  EF >55%, Right ventricle-mild-moderately dilated   Swelling of limb    LEA VENOUS, 08/22/2009 - no evidence of deep vein or superficial thrombosis; partially rupturing Baker's Cyst    Past Surgical History:  Procedure Laterality Date   CARDIAC CATHETERIZATION Bilateral 05/10/2007   Significant 1-vessel disease, severely dilated aortic root with moderate severe aortic insufficiency   CARDIAC SURGERY     CARDIOVERSION  08/02/2007   150 Joule biphasic shock with restoration of sinus rhythm. Heart rate 60.   cataract surgery      COLONOSCOPY WITH PROPOFOL N/A 12/15/2016   Procedure: COLONOSCOPY WITH PROPOFOL;  Surgeon: Carol Ada, MD;  Location: [facility];  Service: Endoscopy;  Laterality:  N/A;   EYE SURGERY     INTERCOSTAL NERVE BLOCK Right 12/08/2019   Procedure: INTERCOSTAL NERVE BLOCK;  Surgeon: [name], MD;  Location: [facility];  Service: Thoracic;  Laterality: Right;   IR IMAGING GUIDED PORT INSERTION  09/28/2020   IR THORACENTESIS ASP PLEURAL SPACE W/IMG GUIDE  08/05/2020   NM MYOVIEW LTD  04/08/2007   No evidence of inducible myocardial ischemia   NODE DISSECTION Right 12/08/2019   Procedure: NODE DISSECTION;  Surgeon: [name], MD;  Location: [facility];  Service: Thoracic;  Laterality: Right;   THYROIDECTOMY, PARTIAL     torn meniscus in right knee surgery       Social History   Socioeconomic History   Marital status: Married    Spouse name: diane   Number of children: Not on file   Years of education: Not on file   Highest education level: Not on file  Occupational History   Occupation: retired  Tobacco Use   Smoking status: Former    Types: Cigarettes    Quit date: 04/11/1987    Years since quitting: 33.6   Smokeless tobacco: Never  Vaping Use   Vaping Use: Never used  Substance and Sexual Activity   Alcohol use: No   Drug use: No   Sexual activity: Not on file  Other Topics Concern   Not on file  Social History Narrative   Not on file   Social Determinants of Health   Financial Resource Strain: Low Risk    Difficulty of Paying Living Expenses: Not hard at all  Food Insecurity: No Food Insecurity   Worried About Charity fundraiser in the Last Year: Never true   [place] in the Last Year: Never true  Transportation Needs: No Transportation Needs   Lack of Transportation (Medical): No   Lack of Transportation (Non-Medical): No  Physical Activity: Insufficiently Active   Days of Exercise per Week: 3 days   Minutes of Exercise per Session: 40 min  Stress: No Stress Concern Present   Feeling of Stress : Not at all  Social Connections: Moderately Integrated   Frequency of Communication with Friends and Family: More than  three times a week   Frequency of Social Gatherings with Friends and Family: Never   Attends Religious Services: 1 to 4 times per year   Active Member of [employer] or Organizations: No   Attends Music therapist: Never   Marital Status: Married  Human resources officer Violence: Not At Risk   Fear of Current or Ex-Partner: No   Emotionally Abused: No   Physically Abused: No   Sexually Abused: No    Family History  Problem Relation Age of Onset   Cancer Father     Current Facility-Administered Medications  Medication Dose  Route Frequency Provider Last Rate Last Admin   cefTRIAXone (ROCEPHIN) 2 g in sodium chloride 0.9 % 100 mL IVPB  2 g Intravenous Once Lacretia Leigh, MD 200 mL/hr at 12/06/20 1727 2 g at 12/06/20 1727   lactated ringers infusion   Intravenous Continuous Lacretia Leigh, MD   Paused at 12/06/20 1718   Current Outpatient Medications  Medication Sig Dispense Refill   acetaminophen (TYLENOL) 500 MG tablet Take 500 mg by mouth every 6 (six) hours as needed for mild pain or headache.     ALPRAZolam (XANAX) 0.25 MG tablet TAKE 1 TABLET (0.25 MG TOTAL) BY MOUTH EVERY 8 (EIGHT) HOURS AS NEEDED FOR UP TO 20 DOSES FOR ANXIETY. 20 tablet 0   atorvastatin (LIPITOR) 80 MG tablet TAKE 1 TABLET BY MOUTH EVERY DAY (Patient taking differently: Take 80 mg by mouth at bedtime.) 90 tablet 2   cetirizine (ZYRTEC) 10 MG tablet Take 10 mg by mouth at bedtime.     Coenzyme Q10-Vitamin E (QUNOL ULTRA COQ10 PO) Take 1 capsule by mouth daily with breakfast.     dexamethasone (DECADRON) 4 MG tablet Take 4 mg by mouth See admin instructions. Take 4 mg by mouth two times a day the day before, day of, and day after chemotherapy     enoxaparin (LOVENOX) 80 MG/0.8ML injection Inject 0.8 mLs (80 mg total) into the skin 2 (two) times daily. 48 mL 2   folic acid (FOLVITE) 1 MG tablet TAKE 1 TABLET BY MOUTH EVERY DAY (Patient taking differently: Take 1 mg by mouth daily with breakfast.) 30 tablet 2    furosemide (LASIX) 20 MG tablet Take 20 mg by mouth as needed for edema.     lidocaine-prilocaine (EMLA) cream Apply 1 application topically as needed. (Patient taking differently: Apply 1 application topically as needed (as directed).) 30 g 2   Multiple Vitamin (ONE-A-DAY 55 PLUS PO) Take 1 tablet by mouth daily with breakfast.     Multiple Vitamins-Minerals (PRESERVISION AREDS 2) CAPS Take 1 capsule by mouth daily with breakfast.     prochlorperazine (COMPAZINE) 10 MG tablet Take 1 tablet (10 mg total) by mouth every 6 (six) hours as needed. (Patient taking differently: Take 10 mg by mouth every 6 (six) hours as needed for nausea or vomiting.) 30 tablet 2   ramipril (ALTACE) 10 MG capsule TAKE 1 CAPSULE BY MOUTH EVERY DAY (Patient taking differently: Take 10 mg by mouth daily.) 90 capsule 3     Allergies  Allergen Reactions   Amlodipine Anaphylaxis    Weakness and fatigue, also    Sulfa Antibiotics Shortness Of Breath   Tetanus Toxoids Anaphylaxis   Crestor [Rosuvastatin] Other (See Comments)    Myalgia    Penicillins Hives    Has patient had a PCN reaction causing immediate rash, facial/tongue/throat swelling, SOB or lightheadedness with hypotension: No Has patient had a PCN reaction causing severe rash involving mucus membranes or skin necrosis: Yes Has patient had a PCN reaction that required hospitalization: No Has patient had a PCN reaction occurring within the last 10 years: No If all of the above answers are "NO", then may proceed with Cephalosporin use.     ROS:   All other systems reviewed & are negative except per HPI or as noted below: Constitutional:  No fevers, chills, sweats.  Weight okay Eyes:  No vision changes, No discharge HENT:  No sore throats, nasal drainage Lymph: No neck swelling, No bruising easily Pulmonary:  No cough, productive sputum CV: No  orthopnea, PND  Patient bedridden for several days.  Has some dyspnea on exertion.   GI:  No personal nor family  history of GI/colon cancer, inflammatory bowel disease, irritable bowel syndrome, allergy such as Celiac Sprue, dietary/dairy problems, colitis, ulcers nor gastritis.  No recent sick contacts/gastroenteritis.  No travel outside the country.  No changes in diet. Renal: No UTIs, No hematuria Genital:  No drainage, bleeding, masses Musculoskeletal: No severe joint pain.  Good ROM major joints Skin:  No sores or lesions.  No rashes Heme/Lymph: Fully anticoagulated on Lovenox shots.   No swollen lymph nodes Neuro: No focal weakness/numbness.  No seizures Psych: No suicidal ideation.  No hallucinations  BP 113/63   Pulse 98   Temp 98.2 F (36.8 C) (Oral)   Resp 14   Ht 5\' 10"  (1.778 m)   Wt 73.6 kg   SpO2 95%   BMI 23.27 kg/m   Physical Exam:  Constitutional: Patient sleeping in bed.  Opens eyes and answer some questions.  Looks tired but not necessarily toxic not cachectic.  Hygeine adequate.  Vitals signs as above.   Eyes: Wears glasses pupils reactive, normal extraocular movements. Sclera nonicteric Neuro: CN II-XII intact.  No major focal sensory defects.  No major motor deficits. Lymph: No head/neck/groin lymphadenopathy Psych:  No severe agitation.  No severe anxiety.  Judgment & insight Impaired, Oriented x2, HENT: Normocephalic, Mucus membranes moist.  No thrush.   Neck: Supple, No tracheal deviation.  No obvious thyromegaly Chest: No pain to chest wall compression.  Good respiratory excursion.  No audible wheezing CV:  Pulses intact.  Regular rhythm.  No major extremity edema  Abdomen:   Hernia: Present at: Left inguinal region.  Sensitive but reducible .  No abdominal or umbilical hernias diastasis recti: Mild supraumbilical midline. Soft.   Mildly distended.   Mild abdominal discomfort in left upper quadrant, right upper quadrant, right lower quadrant.  Left lower quadrant does not seem particularly tender.  No rebound tenderness or guarding. .  No hepatomegaly.  No  splenomegaly  Gen:  Inguinal hernia: Present at: Left inguinal region.  Sensitive but reducible.  Just echoing only.  No obvious hernia on the right side. .  Inguinal lymph nodes: without lymphadenopathy.    Rectal: (Deferred)  Ext: No obvious deformity or contracture.  Edema: Present at: Bilateral lower extremities.  Wife notes swelling has gone down since he has been on Lovenox for the past 2 months .  No cyanosis Skin: No major subcutaneous nodules.  Warm and dry Musculoskeletal: Severe joint rigidity not present.  No obvious clubbing.  No digital petechiae.     Results:   Labs: Results for orders placed or performed during the hospital encounter of 12/06/20 (from the past 48 hour(s))  Basic metabolic panel     Status: Abnormal   Collection Time: 12/06/20 12:14 PM  Result Value Ref Range   Sodium 132 (L) 135 - 145 mmol/L   Potassium 3.9 3.5 - 5.1 mmol/L   Chloride 98 98 - 111 mmol/L   CO2 24 22 - 32 mmol/L   Glucose, Bld 95 70 - 99 mg/dL    Comment: Glucose reference range applies only to samples taken after fasting for at least 8 hours.   BUN 25 (H) 8 - 23 mg/dL   Creatinine, Ser 0.93 0.61 - 1.24 mg/dL   Calcium 8.2 (L) 8.9 - 10.3 mg/dL   GFR, Estimated >60 >60 mL/min    Comment: (NOTE) Calculated using the CKD-EPI  Creatinine Equation (2021)    Anion gap 10 5 - 15    Comment: Performed at [facility], [place] [address], [place], [place] [address]  CBC     Status: Abnormal   Collection Time: 12/06/20 12:14 PM  Result Value Ref Range   WBC 14.5 (H) 4.0 - 10.5 K/uL   RBC 2.77 (L) 4.22 - 5.81 MIL/uL   Hemoglobin 8.4 (L) 13.0 - 17.0 g/dL   HCT 25.6 (L) 39.0 - 52.0 %   MCV 92.4 80.0 - 100.0 fL   MCH 30.3 26.0 - 34.0 pg   MCHC 32.8 30.0 - 36.0 g/dL   RDW 20.6 (H) 11.5 - 15.5 %   Platelets 203 150 - 400 K/uL   nRBC 0.0 0.0 - 0.2 %    Comment: Performed at [facility], [place] [address], [place], [place] [address]  Troponin I (High  Sensitivity)     Status: None   Collection Time: 12/06/20 12:14 PM  Result Value Ref Range   Troponin I (High Sensitivity) 15 <18 ng/L    Comment: (NOTE) Elevated high sensitivity troponin I (hsTnI) values and significant  changes across serial measurements may suggest ACS but many other  chronic and acute conditions are known to elevate hsTnI results.  Refer to the "Links" section for chest pain algorithms and additional  guidance. Performed at [facility], [place] [address], [place], [place] [address]   Hepatic function panel     Status: Abnormal   Collection Time: 12/06/20 12:14 PM  Result Value Ref Range   Total Protein 6.6 6.5 - 8.1 g/dL   Albumin 2.6 (L) 3.5 - 5.0 g/dL   AST 139 (H) 15 - 41 U/L   ALT 226 (H) 0 - 44 U/L   Alkaline Phosphatase 153 (H) 38 - 126 U/L   Total Bilirubin 1.3 (H) 0.3 - 1.2 mg/dL   Bilirubin, Direct 0.3 (H) 0.0 - 0.2 mg/dL   Indirect Bilirubin 1.0 (H) 0.3 - 0.9 mg/dL    Comment: Performed at [facility], [place] [address] [place], [place] [address]  Protime-INR     Status: None   Collection Time: 12/06/20 12:14 PM  Result Value Ref Range   Prothrombin Time 14.6 11.4 - 15.2 seconds   INR 1.1 0.8 - 1.2    Comment: (NOTE) INR goal varies based on device and disease states. Performed at [facility], [place] [address] [place], [place] [address]   Troponin I (High Sensitivity)     Status: None   Collection Time: 12/06/20  3:34 PM  Result Value Ref Range   Troponin I (High Sensitivity) 16 <18 ng/L    Comment: (NOTE) Elevated high sensitivity troponin I (hsTnI) values and significant  changes across serial measurements may suggest ACS but many other  chronic and acute conditions are known to elevate hsTnI results.  Refer to the "Links" section for chest pain algorithms and additional  guidance. Performed at [facility], [place] [address] [place], [place] [address]   Urinalysis,  Routine w reflex microscopic     Status: Abnormal   Collection Time: 12/06/20  4:44 PM  Result Value Ref Range   Color, Urine YELLOW YELLOW   APPearance HAZY (A) CLEAR   Specific Gravity, Urine 1.043 (H) 1.005 - 1.030   pH 6.0 5.0 - 8.0   Glucose, UA NEGATIVE NEGATIVE mg/dL   Hgb urine dipstick NEGATIVE NEGATIVE   Bilirubin Urine NEGATIVE NEGATIVE  Ketones, ur 5 (A) NEGATIVE mg/dL   Protein, ur NEGATIVE NEGATIVE mg/dL   Nitrite NEGATIVE NEGATIVE   Leukocytes,Ua NEGATIVE NEGATIVE    Comment: Performed at [facility] [address], [place], [place] [address]    Imaging / Studies: DG Chest 2 View  Result Date: 12/06/2020 CLINICAL DATA:  Chest pain. EXAM: CHEST - 2 VIEW COMPARISON:  November 23, 2020. FINDINGS: The heart size and mediastinal contours are within normal limits. Status post cardiac valve repair. Right internal jugular Port-A-Cath is unchanged in position. No pneumothorax is noted. Left lung is clear. Stable probable scarring is noted in the right lung base. The visualized skeletal structures are unremarkable. IMPRESSION: Stable right lung scarring.  No acute abnormality seen. Electronically Signed   By: Marijo Conception M.D.   On: 12/06/2020 11:38   CT Angio Chest PE W/Cm &/Or Wo Cm  Result Date: 12/06/2020 CLINICAL DATA:  Concern for pulmonary embolism. History of lung cancer with chemotherapy ongoing. EXAM: CT ANGIOGRAPHY CHEST WITH CONTRAST TECHNIQUE: Multidetector CT imaging of the chest was performed using the standard protocol during bolus administration of intravenous contrast. Multiplanar CT image reconstructions and MIPs were obtained to evaluate the vascular anatomy. CONTRAST:  163mL OMNIPAQUE IOHEXOL 350 MG/ML SOLN COMPARISON:  CT chest 11/01/2020 FINDINGS: Cardiovascular: No filling defects within the pulmonary arteries to suggest acute pulmonary embolism. Post CABG anatomy. Mediastinum/Nodes: Port in the anterior chest wall with tip in distal SVC. No  axillary or supraclavicular adenopathy. No mediastinal or hilar adenopathy. No pericardial fluid. Esophagus normal. Lungs/Pleura: Volume loss in the RIGHT hemithorax. Suture line in the upper lobe. No suspicious nodularity. Thickening along the fissure is similar prior. LEFT lung is mildly hyperinflated. No acute findings. Upper Abdomen: Limited view of the liver, kidneys, pancreas are unremarkable. Normal adrenal glands. Musculoskeletal: No aggressive osseous lesion. Review of the MIP images confirms the above findings. IMPRESSION: 1. No evidence acute pulmonary embolism. 2. No pulmonary infarction or pneumonia. 3. Postsurgical change in the RIGHT hemithorax without evidence local lung cancer recurrence. Electronically Signed   By: Suzy Bouchard M.D.   On: 12/06/2020 15:09   US Abdomen Limited  Result Date: 12/06/2020 CLINICAL DATA:  Right upper quadrant pain EXAM: ULTRASOUND ABDOMEN LIMITED RIGHT UPPER QUADRANT COMPARISON:  12/06/2020 FINDINGS: Gallbladder: Gallbladder is well distended with evidence of gallbladder sludge. No wall thickening or pericholecystic fluid is noted. Common bile duct: Diameter: Prominent up to 1.2 cm with distal normal tapering. No intrahepatic ductal dilatation is noted. Liver: No focal lesion identified. Within normal limits in parenchymal echogenicity. Portal vein is patent on color Doppler imaging with normal direction of blood flow towards the liver. Other: None. IMPRESSION: Mild gallbladder sludge. Mildly prominent common bile duct centrally with normal distal tapering similar to that seen on prior CT. No intrahepatic ductal dilatation is noted. Electronically Signed   By: Inez Catalina M.D.   On: 12/06/2020 16:52   DG Chest Port 1 View  Result Date: 11/23/2020 CLINICAL DATA:  History of lung cancer. EXAM: PORTABLE CHEST 1 VIEW COMPARISON:  Chest x-ray 09/07/2020 FINDINGS: The cardiac silhouette, mediastinal and hilar contours are within normal limits and stable. Stable  surgical changes from cardiac surgery. Right IJ power port in good position without complicating features. Chronic pulmonary scarring changes.  Chronic right-sided pleural-. IMPRESSION: Chronic lung changes but no acute pulmonary findings. Electronically Signed   By: Marijo Sanes M.D.   On: 11/23/2020 17:24   VAS [place] LOWER EXTREMITY VENOUS (DVT)  Result  Date: 11/23/2020  Lower Venous DVT Study Patient Name:  KAMRON VANWYHE Corvera  Date of Exam:   11/23/2020 Medical Rec #: [id number]      Accession #:    [id number] Date of Birth: September 27, 1945       Patient Gender: M Patient Age:   52 years Exam Location:  [facility] Procedure:      VAS [place] LOWER EXTREMITY VENOUS (DVT) Referring Phys: [facility] [name] --------------------------------------------------------------------------------  Indications: Patient presents today with complaints of acute onset of right lower extremity swelling x 3 days, and right groin pain x 1 day. He denies any symptoms involving the left lower extremity. He denies any SOB.  Risk Factors: Cancer of the lung and currently doing chemotherapy since August 2021. Anticoagulation: Coumadin.  Performing Technologist: Sharlett Iles RVT  Examination Guidelines: A complete evaluation includes B-mode imaging, spectral Doppler, color Doppler, and power Doppler as needed of all accessible portions of each vessel. Bilateral testing is considered an integral part of a complete examination. Limited examinations for reoccurring indications may be performed as noted. The reflux portion of the exam is performed with the patient in reverse Trendelenburg.  +---------+---------------+---------+-----------+---------------+--------------+ RIGHT    CompressibilityPhasicitySpontaneityProperties     Thrombus Aging +---------+---------------+---------+-----------+---------------+--------------+ CFV      None           No       No         softly         Age                                                         echogenic and  Indeterminate                                              brightly                                                                  echogenic                     +---------+---------------+---------+-----------+---------------+--------------+ SFJ      None           No       No         softly         Acute                                                      echogenic                     +---------+---------------+---------+-----------+---------------+--------------+ FV Prox  None           No       No         softly  Age                                                        echogenic and  Indeterminate                                              brightly                      +---------+---------------+---------+-----------+---------------+--------------+ FV Mid   None           No       No         softly         Acute                                                      echogenic                     +---------+---------------+---------+-----------+---------------+--------------+ FV DistalNone           No       No         softly         Acute                                                      echogenic                     +---------+---------------+---------+-----------+---------------+--------------+ PFV      None           No       No         softly         Age                                                        echogenic and  Indeterminate                                              brightly                                                                  echogenic                     +---------+---------------+---------+-----------+---------------+--------------+ POP      None  No       No         softly         Acute                                                      echogenic                      +---------+---------------+---------+-----------+---------------+--------------+ PTV      None           No       No         softly         Acute                                                      echogenic                     +---------+---------------+---------+-----------+---------------+--------------+ PERO     None           No       No         softly         Acute                                                      echogenic                     +---------+---------------+---------+-----------+---------------+--------------+ Soleal   None           No       No         softly         Acute                                                      echogenic                     +---------+---------------+---------+-----------+---------------+--------------+ Gastroc  None           No       No         softly         Acute                                                      echogenic                     +---------+---------------+---------+-----------+---------------+--------------+ GSV      None           No       No         softly  Acute                                                      echogenic                     +---------+---------------+---------+-----------+---------------+--------------+ SSV      None           No       No         softly         Age                                                        echogenic      Indeterminate  +---------+---------------+---------+-----------+---------------+--------------+ SPJ      None           No       No         softly         Acute                                                      echogenic                     +---------+---------------+---------+-----------+---------------+--------------+ TPT      None           No       No         softly         Acute                                                      echogenic                      +---------+---------------+---------+-----------+---------------+--------------+   Right Technical Findings: Acute non-occlusive thrombus in the distal external iliac vein. Acute occlusive thrombus noted in the SFJ, GSV, proximal to distal FV, popliteal vein, gastrocnemius vein, TPT, PTV, popliteal vein and SPJ. Age Indeterminate occlusive thrombus noted in the  CFV, ostial FV, DFV and proximal SSV.  +---------+---------------+---------+-----------+---------------+--------------+ LEFT     CompressibilityPhasicitySpontaneityProperties     Thrombus Aging +---------+---------------+---------+-----------+---------------+--------------+ CFV      Full           Yes      Yes                                      +---------+---------------+---------+-----------+---------------+--------------+ SFJ      Full           Yes      Yes                                      +---------+---------------+---------+-----------+---------------+--------------+  FV Prox  Full           Yes      Yes                                      +---------+---------------+---------+-----------+---------------+--------------+ FV Mid   Full           Yes      Yes                                      +---------+---------------+---------+-----------+---------------+--------------+ FV DistalFull           Yes      Yes                                      +---------+---------------+---------+-----------+---------------+--------------+ PFV      None           No       No         softly         Acute                                                      echogenic                     +---------+---------------+---------+-----------+---------------+--------------+ POP      Full           Yes      Yes                                      +---------+---------------+---------+-----------+---------------+--------------+ PTV      None           No       No         softly         Acute                                                       echogenic                     +---------+---------------+---------+-----------+---------------+--------------+ PERO     None           No       No         softly         Acute                                                      echogenic                     +---------+---------------+---------+-----------+---------------+--------------+ Soleal   None  No       No         softly         Acute                                                      echogenic                     +---------+---------------+---------+-----------+---------------+--------------+ Gastroc  Full           Yes      Yes                                      +---------+---------------+---------+-----------+---------------+--------------+ GSV      None           No       No         softly         Acute                                                      echogenic                     +---------+---------------+---------+-----------+---------------+--------------+ SSV      None           No       No         softly         Acute                                                      echogenic                     +---------+---------------+---------+-----------+---------------+--------------+ TPT      None           No       No         softly         Acute                                                      echogenic                     +---------+---------------+---------+-----------+---------------+--------------+  Left Technical Findings: Acute occlusive thrombus in the proximal GSV, not invading the SFJ (.37 cm from junction). Acute occlusive thrombus in the DFV, TPT, PTV, peroneal veins, SSV and soleal vein.  Findings reported to Dr. Gardiner Rhyme at 2:00 pm.  Summary: RIGHT: - Findings consistent with acute deep vein thrombosis involving the SF junction, right femoral vein, right popliteal vein, right posterior tibial veins,  right peroneal veins, right soleal veins, right gastrocnemius veins, and TPT and SPJ. - Findings consistent with acute superficial  vein thrombosis involving the right great saphenous vein. - Findings consistent with age indeterminate deep vein thrombosis involving the right common femoral vein, right femoral vein, and right proximal profunda vein. - Findings consistent with age indeterminate superficial vein thrombosis involving the right small saphenous vein. - No cystic structure found in the popliteal fossa. - Findings consistent with acute deep vein thrombosis involving the distal external iliac vein.  LEFT: - Findings consistent with acute deep vein thrombosis involving the left proximal profunda vein, left posterior tibial veins, left peroneal veins, left soleal veins, and TPT. - Findings consistent with acute superficial vein thrombosis involving the left great saphenous vein, and left small saphenous vein. - No cystic structure found in the popliteal fossa.  Incidental findings: Abnormal dilatation of the proximal abdominal aorta, measuring 3.2 cm AP x 3.2 cm TRV. Suggest follow-up for AAA duplex. *See table(s) above for measurements and observations. Electronically signed by Ida Rogue MD on 11/23/2020 at 6:44:57 PM.    Final    CT Angio Abd/Pel W and/or Wo Contrast  Result Date: 12/06/2020 CLINICAL DATA:  Abdominal pain and shortness of breath History of abdominal aortic aneurysm EXAM: CTA ABDOMEN AND PELVIS WITHOUT AND WITH CONTRAST TECHNIQUE: Multidetector CT imaging of the abdomen and pelvis was performed using the standard protocol during bolus administration of intravenous contrast. Multiplanar reconstructed images and MIPs were obtained and reviewed to evaluate the vascular anatomy. CONTRAST:  111mL OMNIPAQUE IOHEXOL 350 MG/ML SOLN COMPARISON:  CT abdomen pelvis 08/01/2020 FINDINGS: VASCULAR Aorta: Diffuse atherosclerotic plaque of the abdominal aorta with aneurysm measuring up to 3.2 x 2.5 cm  which is similar in size compared to prior examination where it measured 3.0 x 2.7 cm when measured in a similar manner. Celiac: Mild narrowing of the origin of the celiac artery. Evaluation of the branches of the celiac artery limited due to suboptimal timing of contrast bolus. SMA: No significant stenosis. Renals: Mild narrowing at the origin of the renal arteries. IMA: Patent. Inflow: The right common iliac artery is mildly prominent measuring up to 1.7 cm compared to 1.6 cm on the prior study. There is likely at least moderate stenosis at the origin of the right internal iliac artery. No significant stenosis of the right external iliac or common femoral arteries. The left common iliac artery is markedly tortuous with maximum diameter measuring 2.3 cm the overall diameter is not significantly changed since the prior examination. This is likely the site of focal dissection with partially thrombosed false lumen given appearance on prior CT. 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 No significant stenosis of the left external iliac, internal iliac or common femoral arteries. Proximal Outflow: No significant narrowing of visualized right proximal of flow. There is moderate to severe stenosis of the origin of the left superficial femoral artery. Veins: Acute thrombus noted within the right common femoral vein. Review of the MIP images confirms the above findings. NON-VASCULAR Lower chest: No acute abnormality. Hepatobiliary: No focal hepatic lesion. Mild thickening of the wall the gallbladder. Minimal perihepatic ascites. No bile duct dilatation. Portal vein is patent. Pancreas: Unremarkable. No pancreatic ductal dilatation or surrounding inflammatory changes. Spleen: Normal in size without focal abnormality. Adrenals/Urinary Tract: Adrenal glands are normal. Bilateral simple renal cysts are again seen. No  significant abnormality of the bladder or ureters. Stomach/Bowel: Distal colonic diverticulosis without evidence of acute diverticulitis. Distal colonic diverticulosis without evidence  of acute diverticulitis. There has been interval development of omental caking with greatest concentration along the splenic flexure of the colon best seen on images 36-81 of series 4. Lymphatic: No enlarged abdominal or pelvic lymph nodes. Reproductive: Prostate is unremarkable. Other: Small amount of free fluid seen at the pelvic floor. Musculoskeletal: No acute or significant osseous findings. IMPRESSION: VASCULAR 1. Acute right common femoral deep venous thrombosis. 2. Unchanged abdominal aortic aneurysm measuring up to 3.2 x 2.5 cm. Recommend follow-up every 3 years. This recommendation follows ACR consensus guidelines: White Paper of the ACR Incidental Findings Committee II on Vascular Findings. J Am Coll Radiol 2013; 10:789-794. 3. 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 4. 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 5. Please note evaluation arterial vasculature is limited due to suboptimal contrast bolus timing. NON-VASCULAR 1. Mild gallbladder wall thickening is likely reactive. If the patient has symptoms of acute cholecystitis, further evaluation with right upper quadrant ultrasound should be performed. 2. Distal colonic diverticulosis without evidence of acute diverticulitis. 3. Interval development of left upper quadrant omental caking suspicious for progressive metastatic disease. These results were called by telephone at the time of interpretation on 12/06/2020 at 3:55 pm to provider Lacretia Leigh , who verbally acknowledged these results. Electronically Signed   By: Miachel Roux M.D.   On: 12/06/2020 15:57    Medications / Allergies: per chart  Antibiotics: Anti-infectives (From admission, onward)    Start      Dose/Rate Route Frequency Ordered Stop   12/06/20 1715  cefTRIAXone (ROCEPHIN) 2 g in sodium chloride 0.9 % 100 mL IVPB        2 g 200 mL/hr over 30 Minutes Intravenous  Once 12/06/20 1710           Note: Portions of this report may have been transcribed using voice recognition software. Every effort was made to ensure accuracy; however, inadvertent computerized transcription errors may be present.   Any transcriptional errors that result from this process are unintentional.    Adin Hector, MD, FACS, MASCRS Esophageal, Gastrointestinal & Colorectal Surgery Robotic and Minimally Invasive Surgery  [facility], [place]  [place]. [address] [place], [place] [address] [phone] Fax [phone] Main  CONTACT INFORMATION:  Weekday (9AM-5PM): Call [facility] main office at [phone]  Weeknight (5PM-9AM) or Weekend/Holiday: Check [email or web address] (password " [email or web address]") for General Surgery [facility] coverage  (Please, do not use SecureChat as it is not reliable communication to operating surgeons for immediate patient care)        12/06/2020  5:49 PM

## 2020-12-06 NOTE — Progress Notes (Signed)
ANTICOAGULATION CONSULT NOTE - Initial Consult  Pharmacy Consult for Heparin Indication: DVT, hx of mechanical AVR   Allergies  Allergen Reactions   Amlodipine Anaphylaxis    Weakness and fatigue, also    Sulfa Antibiotics Shortness Of Breath   Tetanus Toxoids Anaphylaxis   Crestor [Rosuvastatin] Other (See Comments)    Myalgia    Penicillins Hives    Has patient had a PCN reaction causing immediate rash, facial/tongue/throat swelling, SOB or lightheadedness with hypotension: No Has patient had a PCN reaction causing severe rash involving mucus membranes or skin necrosis: Yes Has patient had a PCN reaction that required hospitalization: No Has patient had a PCN reaction occurring within the last 10 years: No If all of the above answers are "NO", then may proceed with Cephalosporin use.     Patient Measurements: Height: 5\' 10"  (177.8 cm) Weight: 73.6 kg (162 lb 3.2 oz) IBW/kg (Calculated) : 73 Heparin Dosing Weight: TBW  Vital Signs: Temp: 98.2 F (36.8 C) (08/29 1031) Temp Source: Oral (08/29 1031) BP: 113/63 (08/29 1730) Pulse Rate: 98 (08/29 1730)  Labs: Recent Labs    12/06/20 1214 12/06/20 1534  HGB 8.4*  --   HCT 25.6*  --   PLT 203  --   LABPROT 14.6  --   INR 1.1  --   CREATININE 0.93  --   TROPONINIHS 15 16    Estimated Creatinine Clearance: 70.9 mL/min (by C-G formula based on SCr of 0.93 mg/dL).   Medical History: Past Medical History:  Diagnosis Date   Atrial fibrillation ([place])    DVT (deep venous thrombosis) ([place])    Dyspnea    with exertion    Dysrhythmia    Hypertension    Obstructive sleep apnea 10/25/2007   cpap   S/P AVR    2D ECHO, 04/25/2011 - EF >55%, Right ventricle-mild-moderately dilated   Swelling of limb    LEA VENOUS, 08/22/2009 - no evidence of deep vein or superficial thrombosis; partially rupturing Baker's Cyst    Medications:  Scheduled:  Infusions:   cefTRIAXone (ROCEPHIN)  IV     cefTRIAXone (ROCEPHIN)  IV      lactated ringers 125 mL/hr at 12/06/20 1803    Assessment: 55 yoM admitted for possible cholecystectomy.  Pharmacy is consulted to dose heparin peri-operatively while chronic Lovenox is on hold.   PTA Lovenox 80 mg [place] BID.  LD on 8/29 at 0900.   INR 1.1 CBC:  Hgb low at 8.4, Plt 203 No bleeding or complications reported   Goal of Therapy:  Heparin level 0.3-0.7 units/ml Monitor platelets by anticoagulation protocol: Yes   Plan:  No bolus due to Lovenox administration  Start heparin IV infusion at 1300 units/hr Heparin level 8 hours after starting Daily heparin level and CBC Follow up surgical plans.     Gretta Arab PharmD, BCPS Clinical Pharmacist [facility] main pharmacy [phone] 12/06/2020 6:57 PM

## 2020-12-06 NOTE — ED Triage Notes (Signed)
Patient c/o "burning in the left chest." Patient states pain is worse with a deep breath.Patient was told that he has several blood clots in the lower left leg and a blood clot in the right thigh.

## 2020-12-06 NOTE — H&P (Signed)
History and Physical        Hospital Admission Note Date: 12/06/2020  Patient name: Marcus [name] Medical record number: [id number] Date of birth: [birth year] Age: 75 y.o. Gender: male  PCP: [name], MD    Patient coming from: Home  I have reviewed all records in the [facility].    Chief Complaint:  Abdominal pain for the last 3 days  HPI: Patient is a 75 year old male with history of obstructive sleep apnea, hypertension, recently diagnosed bilateral DVT, COPD, CAD CABG x2, hypertension, hyperlipidemia, severe aortic insufficiency status post mechanical AVR in 2008, on indefinite Lovenox, recurrent stage IIIb lung adeno CA on chemotherapy (followed by Dr. Julien Nordmann) presented to ED with abdominal pain.  History was obtained from the patient and his wife at the bedside.  Patient had chemotherapy on 8/25 (Thursday) without any issues.  However next day he started having abdominal pain, but continued to progressively worsen.  Patient reported less pain in the right upper quadrant, sharp, intermittent, 10/10, associated with nausea.  No vomiting.  Had 3 episodes of diarrhea with no hematochezia or melena.  No fevers.  Decreased p.o. intake in the last 3 days.  CT abdomen showed acute right common femoral DVT, mild gallbladder wall thickening, likely reactive however possible acute cholecystitis.  Interval development of left upper quadrant omental caking suspicious for progressive metastatic disease. Ultrasound abdomen showed mild gallbladder sludge.  General surgery/Dr. Johney Maine was consulted by EDP, recommended medicine admission and will evaluate for possible cholecystectomy  ED work-up/course:  In ED, temp 98.2, heart rate 95-106, respiratory rate 13, O2 sats 95% on room air, BP 113/63 Sodium 132, potassium 3.9, BUN 25, creatinine 0.9 Alk phos 153, AST 139, ALT  226 WBCs 14.5, hemoglobin 8.4, hematocrit 25.6  Review of Systems: Positives marked in 'bold' Constitutional: Denies fever, chills, diaphoresis, poor appetite and fatigue.  HEENT: Denies photophobia, eye pain, redness, hearing loss, ear pain, congestion, sore throat, rhinorrhea, sneezing, mouth sores, trouble swallowing, neck pain, neck stiffness and tinnitus.   Respiratory: Denies SOB, DOE, cough, chest tightness,  and wheezing.   Cardiovascular: Denies chest pain, palpitations and leg swelling.  Gastrointestinal: See HPI Genitourinary: Denies dysuria, urgency, frequency, hematuria, flank pain and difficulty urinating.  Musculoskeletal: Denies myalgias, back pain, joint swelling, arthralgias and gait problem.  Skin: Denies pallor, rash and wound.  Neurological: Denies dizziness, seizures, syncope, weakness, light-headedness, numbness and headaches.  Hematological: Denies easy bruising, personal or family bleeding history  Psychiatric/Behavioral: Denies suicidal ideation, mood changes, confusion, nervousness, sleep disturbance and agitation  Past Medical History: Past Medical History:  Diagnosis Date   Atrial fibrillation ([place])    DVT (deep venous thrombosis) ([facility])    Dyspnea    with exertion    Dysrhythmia    Hypertension    Obstructive sleep apnea 10/25/2007   cpap   S/P AVR    2D ECHO, 04/25/2011 - EF >55%, Right ventricle-mild-moderately dilated   Swelling of limb    LEA VENOUS, 08/22/2009 - no evidence of deep vein or superficial thrombosis; partially rupturing Baker's Cyst    Past Surgical History: Past Surgical History:  Procedure Laterality Date   CARDIAC CATHETERIZATION Bilateral  05/10/2007   Significant 1-vessel disease, severely dilated aortic root with moderate severe aortic insufficiency   CARDIAC SURGERY     CARDIOVERSION  08/02/2007   150 Joule biphasic shock with restoration of sinus rhythm. Heart rate 60.   cataract surgery      COLONOSCOPY WITH PROPOFOL N/A  12/15/2016   Procedure: COLONOSCOPY WITH PROPOFOL;  Surgeon: Carol Ada, MD;  Location: [facility];  Service: Endoscopy;  Laterality: N/A;   EYE SURGERY     INTERCOSTAL NERVE BLOCK Right 12/08/2019   Procedure: INTERCOSTAL NERVE BLOCK;  Surgeon: [name], MD;  Location: [facility];  Service: Thoracic;  Laterality: Right;   IR IMAGING GUIDED PORT INSERTION  09/28/2020   IR THORACENTESIS ASP PLEURAL SPACE W/IMG GUIDE  08/05/2020   NM MYOVIEW LTD  04/08/2007   No evidence of inducible myocardial ischemia   NODE DISSECTION Right 12/08/2019   Procedure: NODE DISSECTION;  Surgeon: [name], MD;  Location: [facility];  Service: Thoracic;  Laterality: Right;   THYROIDECTOMY, PARTIAL     torn meniscus in right knee surgery       Medications: Prior to Admission medications   Medication Sig Start Date End Date Taking? Authorizing Provider  acetaminophen (TYLENOL) 500 MG tablet Take 500 mg by mouth every 6 (six) hours as needed for mild pain or headache.   Yes [provider]  ALPRAZolam (XANAX) 0.25 MG tablet TAKE 1 TABLET (0.25 MG TOTAL) BY MOUTH EVERY 8 (EIGHT) HOURS AS NEEDED FOR UP TO 20 DOSES FOR ANXIETY. 12/01/20  Yes Dutch Quint B, FNP  atorvastatin (LIPITOR) 80 MG tablet TAKE 1 TABLET BY MOUTH EVERY DAY Patient taking differently: Take 80 mg by mouth at bedtime. 07/28/20  Yes Troy Sine, MD  cetirizine (ZYRTEC) 10 MG tablet Take 10 mg by mouth at bedtime.   Yes [provider]  Coenzyme Q10-Vitamin E (QUNOL ULTRA COQ10 PO) Take 1 capsule by mouth daily with breakfast.   Yes [provider]  dexamethasone (DECADRON) 4 MG tablet Take 4 mg by mouth See admin instructions. Take 4 mg by mouth two times a day the day before, day of, and day after chemotherapy   Yes [provider]  enoxaparin (LOVENOX) 80 MG/0.8ML injection Inject 0.8 mLs (80 mg total) into the skin 2 (two) times daily. 11/25/20 02/23/21 Yes [place] Donnamarie Poag, DO  folic acid  (FOLVITE) 1 MG tablet TAKE 1 TABLET BY MOUTH EVERY DAY Patient taking differently: Take 1 mg by mouth daily with breakfast. 10/18/20  Yes [name], [name], PA-C  furosemide (LASIX) 20 MG tablet Take 20 mg by mouth as needed for edema.   Yes [provider]  lidocaine-prilocaine (EMLA) cream Apply 1 application topically as needed. Patient taking differently: Apply 1 application topically as needed (as directed). 09/15/20  Yes [name], [name], PA-C  Multiple Vitamin (ONE-A-DAY 55 PLUS PO) Take 1 tablet by mouth daily with breakfast.   Yes [provider]  Multiple Vitamins-Minerals (PRESERVISION AREDS 2) CAPS Take 1 capsule by mouth daily with breakfast.   Yes [provider]  prochlorperazine (COMPAZINE) 10 MG tablet Take 1 tablet (10 mg total) by mouth every 6 (six) hours as needed. Patient taking differently: Take 10 mg by mouth every 6 (six) hours as needed for nausea or vomiting. 08/25/20  Yes [name], [name], PA-C  ramipril (ALTACE) 10 MG capsule TAKE 1 CAPSULE BY MOUTH EVERY DAY Patient taking differently: Take 10 mg by mouth daily. 08/10/20  Yes  [name], [name], MD    Allergies:   Allergies  Allergen Reactions   Amlodipine Anaphylaxis    Weakness and fatigue, also    Sulfa Antibiotics Shortness Of Breath   Tetanus Toxoids Anaphylaxis   Crestor [Rosuvastatin] Other (See Comments)    Myalgia    Penicillins Hives    Has patient had a PCN reaction causing immediate rash, facial/tongue/throat swelling, SOB or lightheadedness with hypotension: No Has patient had a PCN reaction causing severe rash involving mucus membranes or skin necrosis: Yes Has patient had a PCN reaction that required hospitalization: No Has patient had a PCN reaction occurring within the last 10 years: No If all of the above answers are "NO", then may proceed with Cephalosporin use.     Social History:  reports that he quit smoking about 33 years ago. His  smoking use included cigarettes. He has never used smokeless tobacco. He reports that he does not drink alcohol and does not use drugs.  Family History: Family History  Problem Relation Age of Onset   Cancer Father     Physical Exam: Blood pressure 113/63, pulse 98, temperature 98.2 F (36.8 C), temperature source Oral, resp. rate 14, height 5' 10"  (1.778 m), weight 73.6 kg, SpO2 95 %. General: Alert, awake, oriented x3, in no acute distress. Eyes: pink conjunctiva,anicteric sclera, pupils equal and reactive to light and accomodation, HEENT: normocephalic, atraumatic, oropharynx clear Neck: supple, no masses or lymphadenopathy, no goiter, no bruits, no JVD CVS: Regular rate and rhythm, mechanical click present.  1+ RLEedema Resp : Clear to auscultation bilaterally, no wheezing, rales or rhonchi. GI : Soft, right upper quadrant TTP, nondistended, positive bowel sounds. Musculoskeletal: No clubbing or cyanosis, positive pedal pulses. No contracture. ROM intact .  1+ pitting edema right lower extremity, no LLE edema Neuro: Grossly intact, no focal neurological deficits, strength 5/5 upper and lower extremities bilaterally Psych: alert and oriented x 3, normal mood and affect Skin: no rashes or lesions, warm and dry   LABS on Admission: I have personally reviewed all the labs and imagings below    Basic Metabolic Panel: Recent Labs  Lab 12/01/20 0856 12/06/20 1214  NA 135 132*  K 4.2 3.9  CL 102 98  CO2 25 24  GLUCOSE 100* 95  BUN 16 25*  CREATININE 0.79 0.93  CALCIUM 8.7* 8.2*   Liver Function Tests: Recent Labs  Lab 12/01/20 0856 12/06/20 1214  AST 67* 139*  ALT 50* 226*  ALKPHOS 119 153*  BILITOT 0.8 1.3*  PROT 6.7 6.6  ALBUMIN 2.6* 2.6*   No results for input(s): LIPASE, AMYLASE in the last 168 hours. No results for input(s): AMMONIA in the last 168 hours. CBC: Recent Labs  Lab 12/01/20 0856 12/06/20 1214  WBC 5.9 14.5*  NEUTROABS 4.6  --   HGB 9.0* 8.4*   HCT 27.0* 25.6*  MCV 90.6 92.4  PLT 184 203   Cardiac Enzymes: No results for input(s): CKTOTAL, CKMB, CKMBINDEX, TROPONINI in the last 168 hours. BNP: Invalid input(s): POCBNP CBG: No results for input(s): GLUCAP in the last 168 hours.  Radiological Exams on Admission:  DG Chest 2 View  Result Date: 12/06/2020 CLINICAL DATA:  Chest pain. EXAM: CHEST - 2 VIEW COMPARISON:  November 23, 2020. FINDINGS: The heart size and mediastinal contours are within normal limits. Status post cardiac valve repair. Right internal jugular Port-A-Cath is unchanged in position. No pneumothorax is noted. Left lung is clear. Stable probable scarring is noted in the right lung  base. The visualized skeletal structures are unremarkable. IMPRESSION: Stable right lung scarring.  No acute abnormality seen. Electronically Signed   By: Marijo Conception M.D.   On: 12/06/2020 11:38   CT Angio Chest PE W/Cm &/Or Wo Cm  Result Date: 12/06/2020 CLINICAL DATA:  Concern for pulmonary embolism. History of lung cancer with chemotherapy ongoing. EXAM: CT ANGIOGRAPHY CHEST WITH CONTRAST TECHNIQUE: Multidetector CT imaging of the chest was performed using the standard protocol during bolus administration of intravenous contrast. Multiplanar CT image reconstructions and MIPs were obtained to evaluate the vascular anatomy. CONTRAST:  173m OMNIPAQUE IOHEXOL 350 MG/ML SOLN COMPARISON:  CT chest 11/01/2020 FINDINGS: Cardiovascular: No filling defects within the pulmonary arteries to suggest acute pulmonary embolism. Post CABG anatomy. Mediastinum/Nodes: Port in the anterior chest wall with tip in distal SVC. No axillary or supraclavicular adenopathy. No mediastinal or hilar adenopathy. No pericardial fluid. Esophagus normal. Lungs/Pleura: Volume loss in the RIGHT hemithorax. Suture line in the upper lobe. No suspicious nodularity. Thickening along the fissure is similar prior. LEFT lung is mildly hyperinflated. No acute findings. Upper Abdomen:  Limited view of the liver, kidneys, pancreas are unremarkable. Normal adrenal glands. Musculoskeletal: No aggressive osseous lesion. Review of the MIP images confirms the above findings. IMPRESSION: 1. No evidence acute pulmonary embolism. 2. No pulmonary infarction or pneumonia. 3. Postsurgical change in the RIGHT hemithorax without evidence local lung cancer recurrence. Electronically Signed   By: SSuzy BouchardM.D.   On: 12/06/2020 15:09   UKoreaAbdomen Limited  Result Date: 12/06/2020 CLINICAL DATA:  Right upper quadrant pain EXAM: ULTRASOUND ABDOMEN LIMITED RIGHT UPPER QUADRANT COMPARISON:  12/06/2020 FINDINGS: Gallbladder: Gallbladder is well distended with evidence of gallbladder sludge. No wall thickening or pericholecystic fluid is noted. Common bile duct: Diameter: Prominent up to 1.2 cm with distal normal tapering. No intrahepatic ductal dilatation is noted. Liver: No focal lesion identified. Within normal limits in parenchymal echogenicity. Portal vein is patent on color Doppler imaging with normal direction of blood flow towards the liver. Other: None. IMPRESSION: Mild gallbladder sludge. Mildly prominent common bile duct centrally with normal distal tapering similar to that seen on prior CT. No intrahepatic ductal dilatation is noted. Electronically Signed   By: MInez CatalinaM.D.   On: 12/06/2020 16:52   CT Angio Abd/Pel W and/or Wo Contrast  Result Date: 12/06/2020 CLINICAL DATA:  Abdominal pain and shortness of breath History of abdominal aortic aneurysm EXAM: CTA ABDOMEN AND PELVIS WITHOUT AND WITH CONTRAST TECHNIQUE: Multidetector CT imaging of the abdomen and pelvis was performed using the standard protocol during bolus administration of intravenous contrast. Multiplanar reconstructed images and MIPs were obtained and reviewed to evaluate the vascular anatomy. CONTRAST:  1017mOMNIPAQUE IOHEXOL 350 MG/ML SOLN COMPARISON:  CT abdomen pelvis 08/01/2020 FINDINGS: VASCULAR Aorta: Diffuse  atherosclerotic plaque of the abdominal aorta with aneurysm measuring up to 3.2 x 2.5 cm which is similar in size compared to prior examination where it measured 3.0 x 2.7 cm when measured in a similar manner. Celiac: Mild narrowing of the origin of the celiac artery. Evaluation of the branches of the celiac artery limited due to suboptimal timing of contrast bolus. SMA: No significant stenosis. Renals: Mild narrowing at the origin of the renal arteries. IMA: Patent. Inflow: The right common iliac artery is mildly prominent measuring up to 1.7 cm compared to 1.6 cm on the prior study. There is likely at least moderate stenosis at the origin of the right internal iliac artery. No significant stenosis  of the right external iliac or common femoral arteries. The left common iliac artery is markedly tortuous with maximum diameter measuring 2.3 cm the overall diameter is not significantly changed since the prior examination. This is likely the site of focal dissection with partially thrombosed false lumen given appearance on prior CT. 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 No significant stenosis of the left external iliac, internal iliac or common femoral arteries. Proximal Outflow: No significant narrowing of visualized right proximal of flow. There is moderate to severe stenosis of the origin of the left superficial femoral artery. Veins: Acute thrombus noted within the right common femoral vein. Review of the MIP images confirms the above findings. NON-VASCULAR Lower chest: No acute abnormality. Hepatobiliary: No focal hepatic lesion. Mild thickening of the wall the gallbladder. Minimal perihepatic ascites. No bile duct dilatation. Portal vein is patent. Pancreas: Unremarkable. No pancreatic ductal dilatation or surrounding inflammatory changes. Spleen: Normal in size without focal abnormality.  Adrenals/Urinary Tract: Adrenal glands are normal. Bilateral simple renal cysts are again seen. No significant abnormality of the bladder or ureters. Stomach/Bowel: Distal colonic diverticulosis without evidence of acute diverticulitis. Distal colonic diverticulosis without evidence of acute diverticulitis. There has been interval development of omental caking with greatest concentration along the splenic flexure of the colon best seen on images 36-81 of series 4. Lymphatic: No enlarged abdominal or pelvic lymph nodes. Reproductive: Prostate is unremarkable. Other: Small amount of free fluid seen at the pelvic floor. Musculoskeletal: No acute or significant osseous findings. IMPRESSION: VASCULAR 1. Acute right common femoral deep venous thrombosis. 2. Unchanged abdominal aortic aneurysm measuring up to 3.2 x 2.5 cm. Recommend follow-up every 3 years. This recommendation follows ACR consensus guidelines: White Paper of the ACR Incidental Findings Committee II on Vascular Findings. J Am Coll Radiol 2013; 10:789-794. 3. 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 4. 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 5. Please note evaluation arterial vasculature is limited due to suboptimal contrast bolus timing. NON-VASCULAR 1. Mild gallbladder wall thickening is likely reactive. If the patient has symptoms of acute cholecystitis, further evaluation with right upper quadrant ultrasound should be performed. 2. Distal colonic diverticulosis without evidence of acute diverticulitis. 3. Interval development of left upper quadrant omental caking suspicious for progressive metastatic disease. These results were called by telephone at the time of interpretation on 12/06/2020 at 3:55 pm to provider Lacretia Leigh , who verbally acknowledged these results. Electronically Signed   By: Miachel Roux M.D.   On: 12/06/2020 15:57       EKG: Independently reviewed.  Rate 101, sinus tachycardia   Assessment/Plan Principal Problem:   Acute cholecystitis -Presented with abdominal pain, elevated LFTs, CT concerning for possible acute cholecystitis -Continue n.p.o., pain control, IV fluids -General surgery consulted, further recommendations per surgery  Active Problems: History of recent DVT -Dopplers on 8/16 showed extensive bilateral deep and superficial vein thrombosis, while on Coumadin -Patient was placed on therapeutic Lovenox indefinitely -will need to place on IV heparin drip while awaiting surgery  Hypertension, hyperlipidemia -Currently BP stable, hold p.o. meds -Placed on IV hydralazine as needed with parameters  Postoperative atrial fibrillation, mechanical AVR -Currently normal sinus rhythm, history of DCCV in 2009, no recurrence since then -Patient is on anticoagulation for DVTs  Obstructive sleep apnea -Continue CPAP nightly  COPD --currently stable, no wheezing -Placed on duo nebs as needed  History of recurrent non-small cell lung cancer, adenocarcinoma stage IIIb -Followed by Dr. Julien Nordmann, on chemotherapy, received last on 8/25  DVT prophylaxis:  will place on IV heparin drip pending surgery  CODE STATUS: Full CODE STATUS, addressed with the patient  Consults called: General surgery has been consulted by EDP  Family Communication: Admission, patients condition and plan of care including tests being ordered have been discussed with the patient and wife who indicates understanding and agree with the plan and Code Status  Admission status:   The medical decision making on this patient was of high complexity and the patient is at high risk for clinical deterioration, therefore this is a level 3 admission.  Severity of Illness:      The appropriate patient status for this patient is INPATIENT. Inpatient status is judged to be reasonable and necessary in order to provide the required  intensity of service to ensure the patient's safety. The patient's presenting symptoms, physical exam findings, and initial radiographic and laboratory data in the context of their chronic comorbidities is felt to place them at high risk for further clinical deterioration. Furthermore, it is not anticipated that the patient will be medically stable for discharge from the hospital within 2 midnights of admission. The following factors support the patient status of inpatient.   " The patient's presenting symptoms include right upper quadrant abdominal pain, nausea " The worrisome physical exam findings include right upper quadrant tenderness " The initial radiographic and laboratory data are worrisome because of transaminitis, CT scan, ultrasound suggestive of acute cholecystitis CAD " The chronic co-morbidities include CAD CABG, recent DVT, lung CA   * I certify that at the point of admission it is my clinical judgment that the patient will require inpatient hospital care spanning beyond 2 midnights from the point of admission due to high intensity of service, high risk for further deterioration and high frequency of surveillance required.*   Time Spent on Admission: 70 mins      Erykah Lippert M.D. [facility] 12/06/2020, 5:55 PM

## 2020-12-06 NOTE — ED Provider Notes (Signed)
[facility] DEPT Provider Note   CSN: [id number] Arrival date & time: 12/06/20  1022     History Chief Complaint  Patient presents with   Abdominal Pain   Chest Pain    OBERON HEHIR is a 75 y.o. male.  75 year old male with history of lung cancer as well as thoracic aneurysm presents with abdominal pain and increasing shortness of breath.  Patient has known history of bilateral lower extremity DVTs he has been treated with Lovenox for this.  This was diagnosed 2 months ago.  He also has a questionable abdominal aortic aneurysm seen on ultrasound but not fully characterized.  Patient denies abdominal discomfort as well as anorexia.  No emesis or fever.  States he feels somewhat more dyspneic.  No black or bloody stools.  No treatment use prior to arrival      Past Medical History:  Diagnosis Date   Atrial fibrillation ([place])    DVT (deep venous thrombosis) ([place])    Dyspnea    with exertion    Dysrhythmia    Hypertension    Obstructive sleep apnea 10/25/2007   cpap   S/P AVR    2D ECHO, 04/25/2011 - EF >55%, Right ventricle-mild-moderately dilated   Swelling of limb    LEA VENOUS, 08/22/2009 - no evidence of deep vein or superficial thrombosis; partially rupturing Baker's Cyst    Patient Active Problem List   Diagnosis Date Noted   DVT (deep venous thrombosis) ([place]) 11/23/2020   Port-A-Cath in place 10/14/2020   Foot pain, bilateral 10/14/2020   Encounter for antineoplastic immunotherapy 09/23/2020   Antineoplastic chemotherapy induced anemia 09/23/2020   Recurrent non-small cell lung cancer ([place]) 08/25/2020   Goals of care, counseling/discussion 08/25/2020   Anemia due to blood loss 08/13/2020   Coagulopathy ([place]) 08/13/2020   Diarrhea 08/13/2020   Diverticulosis of colon 08/13/2020   Flatulence, eructation and gas pain 08/13/2020   Generalized abdominal pain 08/13/2020   Personal history of colonic polyps 08/13/2020   Pleuritic pain  08/10/2020   Decreased appetite 08/10/2020   Adenocarcinoma of lung, stage 3 ([place]) 01/28/2020   Physical exam 01/28/2020   Adenocarcinoma of right lung, stage 3 ([place]) 01/01/2020   Encounter for antineoplastic chemotherapy 01/01/2020   Screening for malignant neoplasm of prostate 12/31/2019   Hypocalcemia 12/31/2019   Hypomagnesemia 12/31/2019   Male hypogonadism 12/31/2019   Presence of prosthetic heart valve 12/31/2019   Thrombocytopenia ([place]) 12/31/2019   Thyromegaly 12/31/2019   Vitamin D deficiency 12/31/2019   Abdominal hernia 06/16/2019   COPD (chronic obstructive pulmonary disease) ([place]) 06/16/2019   Postoperative atrial fibrillation ([place]) 12/22/2012   Sleep apnea 11/01/2012   Bradycardia, severe sinus 09/09/2012   CAD (coronary artery disease) 09/09/2012   S/P thoracic aortic aneurysm repair 09/09/2012   Hx of CABG 09/09/2012   Hypercholesterolemia 09/09/2012   Hypertension 09/09/2012   Other long term (current) drug therapy 06/28/2012   H/O aortic valve replacement 06/28/2012    Past Surgical History:  Procedure Laterality Date   CARDIAC CATHETERIZATION Bilateral 05/10/2007   Significant 1-vessel disease, severely dilated aortic root with moderate severe aortic insufficiency   CARDIAC SURGERY     CARDIOVERSION  08/02/2007   150 Joule biphasic shock with restoration of sinus rhythm. Heart rate 60.   cataract surgery      COLONOSCOPY WITH PROPOFOL N/A 12/15/2016   Procedure: COLONOSCOPY WITH PROPOFOL;  Surgeon: Carol Ada, MD;  Location: [facility];  Service: Endoscopy;  Laterality: N/A;   EYE  SURGERY     INTERCOSTAL NERVE BLOCK Right 12/08/2019   Procedure: INTERCOSTAL NERVE BLOCK;  Surgeon: [name], MD;  Location: [facility];  Service: Thoracic;  Laterality: Right;   IR IMAGING GUIDED PORT INSERTION  09/28/2020   IR THORACENTESIS ASP PLEURAL SPACE W/IMG GUIDE  08/05/2020   NM MYOVIEW LTD  04/08/2007   No evidence of inducible myocardial ischemia   NODE  DISSECTION Right 12/08/2019   Procedure: NODE DISSECTION;  Surgeon: [name], MD;  Location: [facility];  Service: Thoracic;  Laterality: Right;   THYROIDECTOMY, PARTIAL     torn meniscus in right knee surgery          Family History  Problem Relation Age of Onset   Cancer Father     Social History   Tobacco Use   Smoking status: Former    Types: Cigarettes    Quit date: 04/11/1987    Years since quitting: 33.6   Smokeless tobacco: Never  Vaping Use   Vaping Use: Never used  Substance Use Topics   Alcohol use: No   Drug use: No    Home Medications Prior to Admission medications   Medication Sig Start Date End Date Taking? Authorizing Provider  acetaminophen (TYLENOL) 500 MG tablet Take 500 mg by mouth every 6 (six) hours as needed for mild pain or headache.    [provider]  ALPRAZolam (XANAX) 0.25 MG tablet TAKE 1 TABLET (0.25 MG TOTAL) BY MOUTH EVERY 8 (EIGHT) HOURS AS NEEDED FOR UP TO 20 DOSES FOR ANXIETY. 12/01/20   Kennyth Arnold, FNP  atorvastatin (LIPITOR) 80 MG tablet TAKE 1 TABLET BY MOUTH EVERY DAY Patient taking differently: Take 80 mg by mouth at bedtime. 07/28/20   Troy Sine, MD  cetirizine (ZYRTEC) 10 MG tablet Take 10 mg by mouth at bedtime.    [provider]  Coenzyme Q10-Vitamin E (QUNOL ULTRA COQ10 PO) Take 1 capsule by mouth daily with breakfast.    [provider]  dexamethasone (DECADRON) 4 MG tablet Take 4 mg by mouth See admin instructions. Take 4 mg by mouth two times a day the day before, day of, and day after chemotherapy    [provider]  enoxaparin (LOVENOX) 80 MG/0.8ML injection Inject 0.8 mLs (80 mg total) into the skin 2 (two) times daily. 11/25/20 02/23/21  [place] Donnamarie Poag, DO  folic acid (FOLVITE) 1 MG tablet TAKE 1 TABLET BY MOUTH EVERY DAY Patient taking differently: Take 1 mg by mouth daily with breakfast. 10/18/20   [name], [name], PA-C  furosemide (LASIX) 20 MG tablet Take 20 mg  by mouth as needed for edema.    [provider]  lidocaine-prilocaine (EMLA) cream Apply 1 application topically as needed. Patient taking differently: Apply 1 application topically as needed (as directed). 09/15/20   [name], [name], PA-C  Multiple Vitamin (ONE-A-DAY 55 PLUS PO) Take 1 tablet by mouth daily with breakfast.    [provider]  Multiple Vitamins-Minerals (PRESERVISION AREDS 2) CAPS Take 1 capsule by mouth daily with breakfast.    [provider]  prochlorperazine (COMPAZINE) 10 MG tablet Take 1 tablet (10 mg total) by mouth every 6 (six) hours as needed. Patient taking differently: Take 10 mg by mouth every 6 (six) hours as needed for nausea or vomiting. 08/25/20   [name], [name], PA-C  ramipril (ALTACE) 10 MG capsule TAKE 1 CAPSULE BY MOUTH EVERY DAY Patient taking differently: Take 10 mg by mouth daily. 08/10/20  [name], Mihai, MD    Allergies    Amlodipine, Sulfa antibiotics, Tetanus toxoids, Crestor [rosuvastatin], and Penicillins  Review of Systems   Review of Systems  All other systems reviewed and are negative.  Physical Exam Updated Vital Signs BP 100/67   Pulse 90   Temp 98.2 F (36.8 C) (Oral)   Resp (!) 25   Ht 1.778 m (5\' 10" )   Wt 73.6 kg   SpO2 100%   BMI 23.27 kg/m   Physical Exam Vitals and nursing note reviewed.  Constitutional:      General: He is not in acute distress.    Appearance: Normal appearance. He is well-developed. He is not toxic-appearing.  HENT:     Head: Normocephalic and atraumatic.  Eyes:     General: Lids are normal.     Conjunctiva/sclera: Conjunctivae normal.     Pupils: Pupils are equal, round, and reactive to light.  Neck:     Thyroid: No thyroid mass.     Trachea: No tracheal deviation.  Cardiovascular:     Rate and Rhythm: Normal rate and regular rhythm.     Heart sounds: Normal heart sounds. No murmur heard.   No gallop.  Pulmonary:     Effort: Pulmonary effort  is normal. No respiratory distress.     Breath sounds: Normal breath sounds. No stridor. No decreased breath sounds, wheezing, rhonchi or rales.  Abdominal:     General: There is no distension.     Palpations: Abdomen is soft.     Tenderness: There is no abdominal tenderness. There is no rebound.  Musculoskeletal:        General: No tenderness. Normal range of motion.     Cervical back: Normal range of motion and neck supple.  Skin:    General: Skin is warm and dry.     Findings: No abrasion or rash.  Neurological:     Mental Status: He is alert and oriented to person, place, and time. Mental status is at baseline.     GCS: GCS eye subscore is 4. GCS verbal subscore is 5. GCS motor subscore is 6.     Cranial Nerves: Cranial nerves are intact. No cranial nerve deficit.     Sensory: No sensory deficit.     Motor: Motor function is intact.  Psychiatric:        Attention and Perception: Attention normal.        Speech: Speech normal.        Behavior: Behavior normal.    ED Results / Procedures / Treatments   Labs (all labs ordered are listed, but only abnormal results are displayed) Labs Reviewed  BASIC METABOLIC PANEL  CBC  HEPATIC FUNCTION PANEL  PROTIME-INR  OCCULT BLOOD X 1 CARD TO LAB, STOOL  TROPONIN I (HIGH SENSITIVITY)    EKG EKG Interpretation  Date/Time:  Monday December 06 2020 10:52:38 EDT Ventricular Rate:  101 PR Interval:  146 QRS Duration: 136 QT Interval:  361 QTC Calculation: 468 R Axis:   35 Text Interpretation: Sinus tachycardia Atrial premature complex Probable left atrial enlargement Right bundle branch block Confirmed by Lacretia Leigh ([id number]) on 12/06/2020 12:06:39 PM  Radiology DG Chest 2 View  Result Date: 12/06/2020 CLINICAL DATA:  Chest pain. EXAM: CHEST - 2 VIEW COMPARISON:  November 23, 2020. FINDINGS: The heart size and mediastinal contours are within normal limits. Status post cardiac valve repair. Right internal jugular Port-A-Cath is  unchanged in position. No pneumothorax is noted. Left lung is  clear. Stable probable scarring is noted in the right lung base. The visualized skeletal structures are unremarkable. IMPRESSION: Stable right lung scarring.  No acute abnormality seen. Electronically Signed   By: Marijo Conception M.D.   On: 12/06/2020 11:38    Procedures Procedures   Medications Ordered in ED Medications  lactated ringers bolus 1,000 mL (has no administration in time range)  lactated ringers infusion (has no administration in time range)    ED Course  I have reviewed the triage vital signs and the nursing notes.  Pertinent labs & imaging results that were available during my care of the patient were reviewed by me and considered in my medical decision making (see chart for details).    MDM Rules/Calculators/A&P                          Patient had CT of the chest as well as abdomen due to concern for AAA versus PE.  Cholelithiasis noted on abdominal CT and confirmed by abdominal ultrasound. Patient will medicate for pain here.  Findings consistent with cholecystitis.  Will consult general surgery and likely medicine admit Final Clinical Impression(s) / ED Diagnoses Final diagnoses:  None    Rx / DC Orders ED Discharge Orders     None        Lacretia Leigh, MD 12/06/20 1707

## 2020-12-06 NOTE — ED Provider Notes (Signed)
Emergency Medicine Provider Triage Evaluation Note  Marcus [name] , a 75 y.o. male  was evaluated in triage.  Pt complains of abdominal pain along with burning left chest pain worse with exertion. Prior hx of several clots in the lower left left. Also hx of Aortic aneurysm. No oral intake in 3 days. Previously on warfarin and now on levanox injection.   Review of Systems  Positive: Abdominal pain, chest pain, anorexia, diarrhea Negative: Nausea, vomiting  Physical Exam  BP 99/61 (BP Location: Left Arm)   Pulse (!) 102   Temp 98.2 F (36.8 C) (Oral)   Resp 16   Ht 5\' 10"  (1.778 m)   Wt 73.6 kg   SpO2 99%   BMI 23.27 kg/m  Gen:   Awake, no distress   Resp:  Normal effort  MSK:   Moves extremities without difficulty  Other:    Medical Decision Making  Medically screening exam initiated at 11:29 AM.  Appropriate orders placed.  Marcus [name] was informed that the remainder of the evaluation will be completed by another provider, this initial triage assessment does not replace that evaluation, and the importance of remaining in the ED until their evaluation is complete.  Prior hx of AAA, with blood clots present along with recent change in blood thinners. According to wife at the bedside, he has been acting different and not at his baseline. She feels "he is off".    Marcus Fitting, PA-C 12/06/20 1132    Dorie Rank, MD 12/07/20 [phone]

## 2020-12-06 NOTE — Telephone Encounter (Signed)
Patient's wife called and wanted to let Dr C know that patient was likely to have an emergency cholecystectomy tonight at [facility]

## 2020-12-06 NOTE — ED Notes (Signed)
Nuclear med called and states patient will have study done in the am due to having morphine and dilaudid

## 2020-12-06 NOTE — Telephone Encounter (Signed)
Called patient's wife (DPR). She stated patient has not been eating or drinking. She stated patient has upper abdomen pain and his abdomen is swollen. Patient has had recent history of blood clots. Patient's wife stated patient had barely moved all weekend and can hardly get around due to the pain. Encouraged patient's wife to call 911 or go to the ED, due to patient's symptoms and history of clots. Will send message to Dr. Sallyanne Kuster for further advisement.

## 2020-12-06 NOTE — Telephone Encounter (Signed)
Abdominal Pain -pt is in the [facility] ED now.

## 2020-12-07 ENCOUNTER — Inpatient Hospital Stay: Payer: Medicare HMO | Admitting: Registered Nurse

## 2020-12-07 ENCOUNTER — Inpatient Hospital Stay (HOSPITAL_COMMUNITY): Payer: Medicare HMO

## 2020-12-07 DIAGNOSIS — K668 Other specified disorders of peritoneum: Secondary | ICD-10-CM | POA: Diagnosis not present

## 2020-12-07 DIAGNOSIS — D500 Iron deficiency anemia secondary to blood loss (chronic): Secondary | ICD-10-CM

## 2020-12-07 DIAGNOSIS — C3490 Malignant neoplasm of unspecified part of unspecified bronchus or lung: Secondary | ICD-10-CM

## 2020-12-07 DIAGNOSIS — R1084 Generalized abdominal pain: Secondary | ICD-10-CM

## 2020-12-07 LAB — COMPREHENSIVE METABOLIC PANEL
ALT: 160 U/L — ABNORMAL HIGH (ref 0–44)
AST: 86 U/L — ABNORMAL HIGH (ref 15–41)
Albumin: 2.3 g/dL — ABNORMAL LOW (ref 3.5–5.0)
Alkaline Phosphatase: 115 U/L (ref 38–126)
Anion gap: 7 (ref 5–15)
BUN: 19 mg/dL (ref 8–23)
CO2: 25 mmol/L (ref 22–32)
Calcium: 8.2 mg/dL — ABNORMAL LOW (ref 8.9–10.3)
Chloride: 105 mmol/L (ref 98–111)
Creatinine, Ser: 0.79 mg/dL (ref 0.61–1.24)
GFR, Estimated: >60 mL/min (ref >60)
Glucose, Bld: 96 mg/dL (ref 70–99)
Potassium: 4.1 mmol/L (ref 3.5–5.1)
Sodium: 137 mmol/L (ref 135–145)
Total Bilirubin: 0.9 mg/dL (ref 0.3–1.2)
Total Protein: 6 g/dL — ABNORMAL LOW (ref 6.5–8.1)

## 2020-12-07 LAB — HEPARIN LEVEL (UNFRACTIONATED)
Heparin Unfractionated: 0.71 IU/mL — ABNORMAL HIGH (ref 0.30–0.70)
Heparin Unfractionated: 0.4 IU/mL (ref 0.30–0.70)

## 2020-12-07 LAB — CBC
HCT: 23.9 % — ABNORMAL LOW (ref 39.0–52.0)
Hemoglobin: 7.7 g/dL — ABNORMAL LOW (ref 13.0–17.0)
MCH: 30.3 pg (ref 26.0–34.0)
MCHC: 32.2 g/dL (ref 30.0–36.0)
MCV: 94.1 fL (ref 80.0–100.0)
Platelets: 156 10*3/uL (ref 150–400)
RBC: 2.54 MIL/uL — ABNORMAL LOW (ref 4.22–5.81)
RDW: 20.5 % — ABNORMAL HIGH (ref 11.5–15.5)
WBC: 9.5 10*3/uL (ref 4.0–10.5)
nRBC: 0 % (ref 0.0–0.2)

## 2020-12-07 LAB — PREPARE RBC (CROSSMATCH)

## 2020-12-07 MED ORDER — LORATADINE 10 MG PO TABS
10.0000 mg | ORAL_TABLET | Freq: Every day | ORAL | Status: DC
  Administered 2020-12-07 – 2020-12-12 (×6): 10 mg via ORAL
  Filled 2020-12-07 (×6): qty 1

## 2020-12-07 MED ORDER — LACTATED RINGERS IV SOLN: INTRAVENOUS | Status: AC

## 2020-12-07 MED ORDER — ALUM & MAG HYDROXIDE-SIMETH 200-200-20 MG/5ML PO SUSP
30.0000 mL | ORAL | Status: DC | PRN
  Administered 2020-12-07: 30 mL via ORAL
  Filled 2020-12-07: qty 30

## 2020-12-07 MED ORDER — ENOXAPARIN SODIUM 80 MG/0.8ML IJ SOSY
75.0000 mg | PREFILLED_SYRINGE | Freq: Two times a day (BID) | INTRAMUSCULAR | Status: DC
  Administered 2020-12-07 – 2020-12-12 (×11): 75 mg via SUBCUTANEOUS
  Filled 2020-12-07 (×11): qty 0.8

## 2020-12-07 MED ORDER — CHLORHEXIDINE GLUCONATE CLOTH 2 % EX PADS
6.0000 | MEDICATED_PAD | Freq: Every day | CUTANEOUS | Status: DC
  Administered 2020-12-07 – 2020-12-12 (×6): 6 via TOPICAL

## 2020-12-07 MED ORDER — RAMIPRIL 10 MG PO CAPS
10.0000 mg | ORAL_CAPSULE | Freq: Every day | ORAL | Status: DC
  Administered 2020-12-07 – 2020-12-08 (×2): 10 mg via ORAL
  Filled 2020-12-07 (×2): qty 1

## 2020-12-07 MED ORDER — ALPRAZOLAM 0.25 MG PO TABS
0.2500 mg | ORAL_TABLET | Freq: Three times a day (TID) | ORAL | Status: DC | PRN
  Administered 2020-12-10 – 2020-12-12 (×2): 0.25 mg via ORAL
  Filled 2020-12-07 (×2): qty 1

## 2020-12-07 MED ORDER — SODIUM CHLORIDE 0.9% IV SOLUTION: Freq: Once | INTRAVENOUS | Status: DC

## 2020-12-07 MED ORDER — FOLIC ACID 1 MG PO TABS
1.0000 mg | ORAL_TABLET | Freq: Every day | ORAL | Status: DC
  Administered 2020-12-07 – 2020-12-12 (×6): 1 mg via ORAL
  Filled 2020-12-07 (×6): qty 1

## 2020-12-07 MED ORDER — MIRTAZAPINE 15 MG PO TABS
15.0000 mg | ORAL_TABLET | Freq: Every day | ORAL | Status: DC
  Administered 2020-12-07 – 2020-12-12 (×6): 15 mg via ORAL
  Filled 2020-12-07 (×6): qty 1

## 2020-12-07 MED ORDER — SODIUM CHLORIDE 0.9% FLUSH: 10.0000 mL | INTRAVENOUS | Status: DC | PRN

## 2020-12-07 MED ORDER — TECHNETIUM TC 99M MEBROFENIN IV KIT
5.5000 | PACK | Freq: Once | INTRAVENOUS | Status: AC | PRN
  Administered 2020-12-07: 5.5 via INTRAVENOUS

## 2020-12-07 NOTE — Progress Notes (Addendum)
HEMATOLOGY-ONCOLOGY PROGRESS NOTE  SUBJECTIVE: Marcus [name] is a 75 year old male who is followed by our office for recurrent non-small cell lung cancer.  He is currently receiving palliative systemic chemotherapy with carboplatin for an AUC of 5, Alimta 500 mg per metered squared, and Keytruda 200 mg every 3 weeks.  He is status post 5 cycles of treatment.  Last cycle was given on 12/02/2020.  He presented to the emergency department with abdominal pain.  He has also experienced poor p.o. intake for the past 3 days.  CTA chest negative for PE, no pulmonary infarct or pneumonia, postsurgical change in the right hemithorax without evidence of local lung cancer recurrence.  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  Abdominal ultrasound was performed which showed mild gallbladder sludge, mildly prominent common bile duct centrally with normal distal tapering similar to that seen on prior CT, no intrahepatic ductal dilatation.  He was seen by general surgery who was not convinced that he had cholecystitis.  They recommended confirmatory test with HIDA scan.  They did not feel that he was a good candidate for surgery if truly positive and would recommend percutaneous drainage of the bladder.  HIDA scan was performed earlier today which was normal.  General surgery has signed off.  I saw Marcus [name] in the emergency department today.  His wife is at the bedside.  He continues to have pain in his upper abdomen.  He points to his left upper  abdomen and shows that it radiates to the center and towards the right side of his abdomen.  Pain has improved with pain medication.  He is not having any nausea or vomiting at this time.  He had some diarrhea over the weekend.  He has not been eating or drinking.  He feels though his abdomen is distended.  No bleeding reported.  His wife talk to me outside of his hospital room and feels as though he is depressed and is asking for medication for this.  Additionally, she asked for additional information regarding his lung cancer diagnosis and prognosis.  Oncology History  Adenocarcinoma of right lung, stage 3 ([place])  01/01/2020 Initial Diagnosis   Adenocarcinoma of right lung, stage 3 ([place])   05/17/2020 Cancer Staging   Staging form: Lung, AJCC 8th Edition - Clinical: Stage IIIB (cT3, cN2, cM0) - Signed by Curt Bears, MD on 05/17/2020   Recurrent non-small cell lung cancer ([place])  08/25/2020 Initial Diagnosis   Recurrent non-small cell lung cancer ([place])   09/03/2020 -  Chemotherapy    Patient is on Treatment Plan: LUNG CARBOPLATIN / PEMETREXED / PEMBROLIZUMAB Q21D INDUCTION X 4 CYCLES / MAINTENANCE PEMETREXED + PEMBROLIZUMAB          REVIEW OF SYSTEMS:   Constitutional: Denies fevers, chills Eyes: Denies blurriness of vision Ears, nose, mouth, throat, and face: Denies mucositis or sore throat Respiratory: Denies cough, dyspnea or wheezes Cardiovascular: Denies palpitation, chest discomfort Gastrointestinal: Abdominal pain as noted in the HPI Skin: Denies abnormal skin rashes Lymphatics: Denies new lymphadenopathy or easy bruising Neurological:Denies numbness, tingling or new weaknesses Behavioral/Psych: Mood is stable, no new changes  Extremities: Right  lower extremity edema which has improved All other systems were reviewed with the patient and are negative.  I have reviewed the past medical history, past surgical history, social history and family history with the patient and they  are unchanged from previous note.   PHYSICAL EXAMINATION: ECOG PERFORMANCE STATUS: 2 - Symptomatic, <50% confined to bed  Vitals:   12/07/20 0700 12/07/20 1030  BP: 136/83 122/69  Pulse: (!) 119 (!) 102  Resp: 18 18  Temp:    SpO2: 95% 90%   Filed Weights   12/06/20 1048  Weight: 73.6 kg    Intake/Output from previous day: No intake/output data recorded.  GENERAL:alert, no distress and comfortable SKIN: skin color, texture, turgor are normal, no rashes or significant lesions LUNGS: clear to auscultation and percussion with normal breathing effort HEART: regular rate & rhythm and no murmurs and 1+ right lower extremity edema  ABDOMEN: Positive bowel sounds, soft, right upper quadrant tenderness NEURO: alert & oriented x 3 with fluent speech, no focal motor/sensory deficits  LABORATORY DATA:  I have reviewed the data as listed CMP Latest Ref Rng & Units 12/07/2020 12/06/2020 12/01/2020  Glucose 70 - 99 mg/dL 96 95 100(H)  BUN 8 - 23 mg/dL 19 25(H) 16  Creatinine 0.61 - 1.24 mg/dL 0.79 0.93 0.79  Sodium 135 - 145 mmol/L 137 132(L) 135  Potassium 3.5 - 5.1 mmol/L 4.1 3.9 4.2  Chloride 98 - 111 mmol/L 105 98 102  CO2 22 - 32 mmol/L 25 24 25   Calcium 8.9 - 10.3 mg/dL 8.2(L) 8.2(L) 8.7(L)  Total Protein 6.5 - 8.1 g/dL 6.0(L) 6.6 6.7  Total Bilirubin 0.3 - 1.2 mg/dL 0.9 1.3(H) 0.8  Alkaline Phos 38 - 126 U/L 115 153(H) 119  AST 15 - 41 U/L 86(H) 139(H) 67(H)  ALT 0 - 44 U/L 160(H) 226(H) 50(H)    Lab Results  Component Value Date   WBC 9.5 12/07/2020   HGB 7.7 (L) 12/07/2020   HCT 23.9 (L) 12/07/2020   MCV 94.1 12/07/2020   PLT 156 12/07/2020   NEUTROABS 4.6 12/01/2020    DG Chest 2 View  Result Date: 12/06/2020 CLINICAL DATA:  Chest pain. EXAM: CHEST - 2 VIEW COMPARISON:  November 23, 2020. FINDINGS: The heart size and mediastinal contours are within normal limits. Status post cardiac valve repair. Right internal jugular Port-A-Cath is unchanged in position. No  pneumothorax is noted. Left lung is clear. Stable probable scarring is noted in the right lung base. The visualized skeletal structures are unremarkable. IMPRESSION: Stable right lung scarring.  No acute abnormality seen. Electronically Signed   By: Marijo Conception M.D.   On: 12/06/2020 11:38   CT Angio Chest PE W/Cm &/Or Wo Cm  Result Date: 12/06/2020 CLINICAL DATA:  Concern for pulmonary embolism. History of lung cancer with chemotherapy ongoing. EXAM: CT ANGIOGRAPHY CHEST WITH CONTRAST TECHNIQUE: Multidetector CT imaging of the chest was performed using the standard protocol during bolus administration of intravenous contrast. Multiplanar CT image reconstructions and MIPs were obtained to evaluate the vascular anatomy. CONTRAST:  192m OMNIPAQUE IOHEXOL 350 MG/ML SOLN COMPARISON:  CT chest 11/01/2020 FINDINGS: Cardiovascular: No filling defects within the pulmonary arteries to suggest acute pulmonary embolism. Post CABG anatomy. Mediastinum/Nodes: Port in the anterior chest wall with tip in distal SVC. No axillary or supraclavicular adenopathy. No mediastinal or hilar adenopathy. No pericardial fluid. Esophagus normal. Lungs/Pleura: Volume loss in the RIGHT hemithorax. Suture line in the upper lobe. No suspicious nodularity. Thickening along the fissure  is similar prior. LEFT lung is mildly hyperinflated. No acute findings. Upper Abdomen: Limited view of the liver, kidneys, pancreas are unremarkable. Normal adrenal glands. Musculoskeletal: No aggressive osseous lesion. Review of the MIP images confirms the above findings. IMPRESSION: 1. No evidence acute pulmonary embolism. 2. No pulmonary infarction or pneumonia. 3. Postsurgical change in the RIGHT hemithorax without evidence local lung cancer recurrence. Electronically Signed   By: Suzy Bouchard M.D.   On: 12/06/2020 15:09   NM Hepatobiliary Liver Func  Result Date: 12/07/2020 CLINICAL DATA:  Biliary colic, recurrent, gallbladder dyskinesia suspected  EXAM: NUCLEAR MEDICINE HEPATOBILIARY IMAGING TECHNIQUE: Sequential images of the abdomen were obtained out to 60 minutes following intravenous administration of radiopharmaceutical. RADIOPHARMACEUTICALS:  5.5 mCi Tc-84m Choletec IV COMPARISON:  12/06/2020 FINDINGS: Prompt uptake and biliary excretion of activity by the liver is seen. Gallbladder activity is visualized, consistent with patency of cystic duct. Biliary activity passes into small bowel, consistent with patent common bile duct. IMPRESSION: Normal exam.  Patency of the cystic duct and common bile duct. Electronically Signed   By: NDavina PokeD.O.   On: 12/07/2020 10:01   [place]domen Limited  Result Date: 12/06/2020 CLINICAL DATA:  Right upper quadrant pain EXAM: ULTRASOUND ABDOMEN LIMITED RIGHT UPPER QUADRANT COMPARISON:  12/06/2020 FINDINGS: Gallbladder: Gallbladder is well distended with evidence of gallbladder sludge. No wall thickening or pericholecystic fluid is noted. Common bile duct: Diameter: Prominent up to 1.2 cm with distal normal tapering. No intrahepatic ductal dilatation is noted. Liver: No focal lesion identified. Within normal limits in parenchymal echogenicity. Portal vein is patent on color Doppler imaging with normal direction of blood flow towards the liver. Other: None. IMPRESSION: Mild gallbladder sludge. Mildly prominent common bile duct centrally with normal distal tapering similar to that seen on prior CT. No intrahepatic ductal dilatation is noted. Electronically Signed   By: MInez CatalinaM.D.   On: 12/06/2020 16:52   DG Chest Port 1 View  Result Date: 11/23/2020 CLINICAL DATA:  History of lung cancer. EXAM: PORTABLE CHEST 1 VIEW COMPARISON:  Chest x-ray 09/07/2020 FINDINGS: The cardiac silhouette, mediastinal and hilar contours are within normal limits and stable. Stable surgical changes from cardiac surgery. Right IJ power port in good position without complicating features. Chronic pulmonary scarring changes.   Chronic right-sided pleural-. IMPRESSION: Chronic lung changes but no acute pulmonary findings. Electronically Signed   By: PMarijo SanesM.D.   On: 11/23/2020 17:24   VAS [place]OWER EXTREMITY VENOUS (DVT)  Result Date: 11/23/2020  Lower Venous DVT Study Patient Name:  DFRANKI STEMENHILL  Date of Exam:   11/23/2020 Medical Rec #: [id number]     Accession #:    [id number] of Birth: 906-19-1947      Patient Gender: M Patient Age:   89years Exam Location:  [facility] Procedure:      VAS [place]OWER EXTREMITY VENOUS (DVT) Referring Phys: [facility] --------------------------------------------------------------------------------  Indications: Patient presents today with complaints of acute onset of right lower extremity swelling x 3 days, and right groin pain x 1 day. He denies any symptoms involving the left lower extremity. He denies any SOB.  Risk Factors: Cancer of the lung and currently doing chemotherapy since August 2021. Anticoagulation: Coumadin.  Performing Technologist: KSharlett IlesRVT  Examination Guidelines: A complete evaluation includes B-mode imaging, spectral Doppler, color Doppler, and power Doppler as needed of all accessible portions of each vessel. Bilateral testing is considered an integral part of a complete examination.  Limited examinations for reoccurring indications may be performed as noted. The reflux portion of the exam is performed with the patient in reverse Trendelenburg.  +---------+---------------+---------+-----------+---------------+--------------+ RIGHT    CompressibilityPhasicitySpontaneityProperties     Thrombus Aging +---------+---------------+---------+-----------+---------------+--------------+ CFV      None           No       No         softly         Age                                                        echogenic and  Indeterminate                                              brightly                                                                   echogenic                     +---------+---------------+---------+-----------+---------------+--------------+ SFJ      None           No       No         softly         Acute                                                      echogenic                     +---------+---------------+---------+-----------+---------------+--------------+ FV Prox  None           No       No         softly         Age                                                        echogenic and  Indeterminate                                              brightly                      +---------+---------------+---------+-----------+---------------+--------------+ FV Mid   None           No       No         softly         Acute  echogenic                     +---------+---------------+---------+-----------+---------------+--------------+ FV DistalNone           No       No         softly         Acute                                                      echogenic                     +---------+---------------+---------+-----------+---------------+--------------+ PFV      None           No       No         softly         Age                                                        echogenic and  Indeterminate                                              brightly                                                                  echogenic                     +---------+---------------+---------+-----------+---------------+--------------+ POP      None           No       No         softly         Acute                                                      echogenic                     +---------+---------------+---------+-----------+---------------+--------------+ PTV      None           No       No         softly         Acute                                                       echogenic                     +---------+---------------+---------+-----------+---------------+--------------+  PERO     None           No       No         softly         Acute                                                      echogenic                     +---------+---------------+---------+-----------+---------------+--------------+ Soleal   None           No       No         softly         Acute                                                      echogenic                     +---------+---------------+---------+-----------+---------------+--------------+ Gastroc  None           No       No         softly         Acute                                                      echogenic                     +---------+---------------+---------+-----------+---------------+--------------+ GSV      None           No       No         softly         Acute                                                      echogenic                     +---------+---------------+---------+-----------+---------------+--------------+ SSV      None           No       No         softly         Age                                                        echogenic      Indeterminate  +---------+---------------+---------+-----------+---------------+--------------+ SPJ      None           No       No  softly         Acute                                                      echogenic                     +---------+---------------+---------+-----------+---------------+--------------+ TPT      None           No       No         softly         Acute                                                      echogenic                     +---------+---------------+---------+-----------+---------------+--------------+   Right Technical Findings: Acute non-occlusive thrombus in the distal external iliac vein. Acute occlusive thrombus noted in the  SFJ, GSV, proximal to distal FV, popliteal vein, gastrocnemius vein, TPT, PTV, popliteal vein and SPJ. Age Indeterminate occlusive thrombus noted in the  CFV, ostial FV, DFV and proximal SSV.  +---------+---------------+---------+-----------+---------------+--------------+ LEFT     CompressibilityPhasicitySpontaneityProperties     Thrombus Aging +---------+---------------+---------+-----------+---------------+--------------+ CFV      Full           Yes      Yes                                      +---------+---------------+---------+-----------+---------------+--------------+ SFJ      Full           Yes      Yes                                      +---------+---------------+---------+-----------+---------------+--------------+ FV Prox  Full           Yes      Yes                                      +---------+---------------+---------+-----------+---------------+--------------+ FV Mid   Full           Yes      Yes                                      +---------+---------------+---------+-----------+---------------+--------------+ FV DistalFull           Yes      Yes                                      +---------+---------------+---------+-----------+---------------+--------------+ PFV      None           No       No  softly         Acute                                                      echogenic                     +---------+---------------+---------+-----------+---------------+--------------+ POP      Full           Yes      Yes                                      +---------+---------------+---------+-----------+---------------+--------------+ PTV      None           No       No         softly         Acute                                                      echogenic                     +---------+---------------+---------+-----------+---------------+--------------+ PERO     None           No       No         softly          Acute                                                      echogenic                     +---------+---------------+---------+-----------+---------------+--------------+ Soleal   None           No       No         softly         Acute                                                      echogenic                     +---------+---------------+---------+-----------+---------------+--------------+ Gastroc  Full           Yes      Yes                                      +---------+---------------+---------+-----------+---------------+--------------+ GSV      None           No       No         softly         Acute  echogenic                     +---------+---------------+---------+-----------+---------------+--------------+ SSV      None           No       No         softly         Acute                                                      echogenic                     +---------+---------------+---------+-----------+---------------+--------------+ TPT      None           No       No         softly         Acute                                                      echogenic                     +---------+---------------+---------+-----------+---------------+--------------+  Left Technical Findings: Acute occlusive thrombus in the proximal GSV, not invading the SFJ (.37 cm from junction). Acute occlusive thrombus in the DFV, TPT, PTV, peroneal veins, SSV and soleal vein.  Findings reported to Dr. Gardiner Rhyme at 2:00 pm.  Summary: RIGHT: - Findings consistent with acute deep vein thrombosis involving the SF junction, right femoral vein, right popliteal vein, right posterior tibial veins, right peroneal veins, right soleal veins, right gastrocnemius veins, and TPT and SPJ. - Findings consistent with acute superficial vein thrombosis involving the right great saphenous vein. - Findings consistent  with age indeterminate deep vein thrombosis involving the right common femoral vein, right femoral vein, and right proximal profunda vein. - Findings consistent with age indeterminate superficial vein thrombosis involving the right small saphenous vein. - No cystic structure found in the popliteal fossa. - Findings consistent with acute deep vein thrombosis involving the distal external iliac vein.  LEFT: - Findings consistent with acute deep vein thrombosis involving the left proximal profunda vein, left posterior tibial veins, left peroneal veins, left soleal veins, and TPT. - Findings consistent with acute superficial vein thrombosis involving the left great saphenous vein, and left small saphenous vein. - No cystic structure found in the popliteal fossa.  Incidental findings: Abnormal dilatation of the proximal abdominal aorta, measuring 3.2 cm AP x 3.2 cm TRV. Suggest follow-up for AAA duplex. *See table(s) above for measurements and observations. Electronically signed by Ida Rogue MD on 11/23/2020 at 6:44:57 PM.    Final    CT Angio Abd/Pel W and/or Wo Contrast  Result Date: 12/06/2020 CLINICAL DATA:  Abdominal pain and shortness of breath History of abdominal aortic aneurysm EXAM: CTA ABDOMEN AND PELVIS WITHOUT AND WITH CONTRAST TECHNIQUE: Multidetector CT imaging of the abdomen and pelvis was performed using the standard protocol during bolus administration of intravenous contrast. Multiplanar reconstructed images and MIPs were obtained and reviewed to evaluate the vascular anatomy. CONTRAST:  159m OMNIPAQUE IOHEXOL 350 MG/ML SOLN COMPARISON:  CT abdomen pelvis  08/01/2020 FINDINGS: VASCULAR Aorta: Diffuse atherosclerotic plaque of the abdominal aorta with aneurysm measuring up to 3.2 x 2.5 cm which is similar in size compared to prior examination where it measured 3.0 x 2.7 cm when measured in a similar manner. Celiac: Mild narrowing of the origin of the celiac artery. Evaluation of the branches of  the celiac artery limited due to suboptimal timing of contrast bolus. SMA: No significant stenosis. Renals: Mild narrowing at the origin of the renal arteries. IMA: Patent. Inflow: The right common iliac artery is mildly prominent measuring up to 1.7 cm compared to 1.6 cm on the prior study. There is likely at least moderate stenosis at the origin of the right internal iliac artery. No significant stenosis of the right external iliac or common femoral arteries. The left common iliac artery is markedly tortuous with maximum diameter measuring 2.3 cm the overall diameter is not significantly changed since the prior examination. This is likely the site of focal dissection with partially thrombosed false lumen given appearance on prior CT. 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 No significant stenosis of the left external iliac, internal iliac or common femoral arteries. Proximal Outflow: No significant narrowing of visualized right proximal of flow. There is moderate to severe stenosis of the origin of the left superficial femoral artery. Veins: Acute thrombus noted within the right common femoral vein. Review of the MIP images confirms the above findings. NON-VASCULAR Lower chest: No acute abnormality. Hepatobiliary: No focal hepatic lesion. Mild thickening of the wall the gallbladder. Minimal perihepatic ascites. No bile duct dilatation. Portal vein is patent. Pancreas: Unremarkable. No pancreatic ductal dilatation or surrounding inflammatory changes. Spleen: Normal in size without focal abnormality. Adrenals/Urinary Tract: Adrenal glands are normal. Bilateral simple renal cysts are again seen. No significant abnormality of the bladder or ureters. Stomach/Bowel: Distal colonic diverticulosis without evidence of acute diverticulitis. Distal colonic diverticulosis without evidence of acute diverticulitis.  There has been interval development of omental caking with greatest concentration along the splenic flexure of the colon best seen on images 36-81 of series 4. Lymphatic: No enlarged abdominal or pelvic lymph nodes. Reproductive: Prostate is unremarkable. Other: Small amount of free fluid seen at the pelvic floor. Musculoskeletal: No acute or significant osseous findings. IMPRESSION: VASCULAR 1. Acute right common femoral deep venous thrombosis. 2. Unchanged abdominal aortic aneurysm measuring up to 3.2 x 2.5 cm. Recommend follow-up every 3 years. This recommendation follows ACR consensus guidelines: White Paper of the ACR Incidental Findings Committee II on Vascular Findings. J Am Coll Radiol 2013; 10:789-794. 3. 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 4. 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 5. Please note evaluation arterial vasculature is limited due to suboptimal contrast bolus timing. NON-VASCULAR 1. Mild gallbladder wall thickening is likely reactive. If the patient has symptoms of acute cholecystitis, further evaluation with right upper quadrant ultrasound should be performed. 2. Distal colonic diverticulosis without evidence of acute diverticulitis. 3. Interval development of left upper quadrant omental caking suspicious for progressive metastatic disease. These results were called by telephone at the time of interpretation on 12/06/2020 at 3:55 pm to provider Lacretia Leigh , who verbally acknowledged these results. Electronically Signed   By: Miachel Roux M.D.   On: 12/06/2020 15:57    ASSESSMENT AND PLAN: This is a 75 year old  male with metastatic non-small cell lung cancer initially diagnosed as a stage IIIb non-small cell lung cancer, adenocarcinoma.  He initially presented with a right upper lobe lung mass in addition to right hilar and mediastinal lymphadenopathy diagnosed in August 2021 with  negative actual mutation and PD-L1 expression in the range of 1 to 49%.  He declined enrollment in the alliance clinical trial for treatment with systemic chemotherapy +/- immunotherapy.  He underwent adjuvant systemic chemotherapy with cisplatin 75 mg per metered squared and Alimta 500 mg per metered squared every 3 weeks.  Status post 4 cycles.  He tolerated treatment fairly well with no concerning adverse effects.  He had evidence for disease recurrence in March 2022 with pleural-based metastasis that was biopsy-proven to be recurrent adenocarcinoma of the lung.  Molecular studies by [facility] showed no actionable mutations.  He has been receiving systemic chemotherapy with carboplatin for an AUC of 5, Alimta 500 mg per metered squared, and Keytruda 200 mg IV every 3 weeks.  He is now status post 5 cycles.  He has tolerated his treatment fairly well.  He was due for restaging CT scans prior to his next cycle of treatment.  He presented to the emergency department with abdominal pain.  CT scans were obtained and were initially concerning for possible acute cholecystitis.  This has now been ruled out.  However, scans are concerning for omental caking consistent with worsening metastatic disease. Scan results were discussed with the patient and his wife.  I had additional discussions with the patient's wife outside of his room per her request.  She specifically asked about his prognosis.  I again reviewed the incurable nature of his disease and that treatment is palliative.  I further discussed that omental caking overall infers a poor prognosis.  I am unsure if Dr. Julien Nordmann will offer additional chemotherapy but she will need to discuss this with him at his next follow-up visit.  It is reasonable to have further discussion with the palliative care team regarding goals of care discussion and to consider hospice as an option.  She is agreeable to talking to their team.  Also, will ask for their assistance in pain  management.  His hemoglobin is 7.7 today.  We will give 1 unit PRBCs today.  Recommend PRBC transfusion to keep hemoglobin above 8.  At this time, okay from our standpoint to transition from heparin back to Lovenox if no procedures are planned.  The patient's wife asked for something to help with his depression.  I have started him on Remeron 15 mg at bedtime.   LOS: 1 day   Mikey Bussing, DNP, AGPCNP-BC, AOCNP 12/07/20

## 2020-12-07 NOTE — Progress Notes (Signed)
[facility]                                                                              Patient Demographics  Marcus [name], is a 75 y.o. male, DOB - 1945-05-06, ASN:[id number]  Admit date - 12/06/2020   Admitting Physician Keiva Dina Krystal Eaton, MD  Outpatient Primary MD for the patient is [name], MD  Outpatient specialists:   LOS - 1  days   Medical records reviewed and are as summarized below:    Chief Complaint  Patient presents with   Abdominal Pain   Chest Pain       Brief summary   Patient is a 75 year old male with OSA, HTN, recently diagnosed bilateral DVT, COPD, CAD CABG x2, HTN, HLP severe aortic insufficiency status post mechanical AVR in 2008, on indefinite Lovenox, recurrent stage IIIb lung adeno CA on chemotherapy (followed by Dr. Julien Nordmann) was admitted with abdominal pain.  Patient had chemo on 8/25 (Thursday) without any issues.  However next day he started having abdominal pain, and continued to progressively worsen.  Patient reported pain in the right upper quadrant, sharp, intermittent, 10/10, associated with nausea.  No vomiting.  Had 3 episodes of diarrhea with no hematochezia or melena.  No fevers.  Decreased p.o. intake in the last 3 days.   CT abdomen showed acute right common femoral DVT, mild gallbladder wall thickening, likely reactive however possible acute cholecystitis.  Interval development of left upper quadrant omental caking suspicious for progressive metastatic disease. Ultrasound abdomen showed mild gallbladder sludge.  General surgery was consulted  Assessment & Plan    Acute abdominal pain -Initially, concern about acute cholecystitis.  General surgery was consulted.  -placed on n.p.o. status, IV fluids, IV antibiotics, pain control -HIDA scan negative  - per surgery, no need for cholecystectomy, placed on clears -DC IV Rocephin -CT abdomen also showed interval development of omental caking in LUQ suspicious for  progressive metastatic disease   Active Problems:   History of recurrent non-small cell lung cancer, adenocarcinoma stage IIIb -Followed by Dr. Julien Nordmann, on chemotherapy, received last on 8/25  -CT abdomen also showed interval development of omental caking in LUQ suspicious for progressive metastatic disease - oncology consulted, will await recommendations  History of recent DVT -Dopplers on 8/16 showed extensive bilateral deep and superficial vein thrombosis, while on Coumadin -Patient was placed on therapeutic Lovenox indefinitely.  Currently on IV heparin.    Hypertension, hyperlipidemia -Hold statins due to transaminitis -Resume ramipril   Postoperative atrial fibrillation, mechanical AVR -Currently normal sinus rhythm, history of DCCV in 2009, no recurrence since then -Patient is on anticoagulation for DVTs   Obstructive sleep apnea -Continue CPAP nightly   COPD --currently stable, no wheezing -Placed on duo nebs as needed   Code Status: Full CODE STATUS DVT Prophylaxis:    Currently on IV heparin drip   Level of Care: Level of care: Progressive Family Communication: Discussed all imaging results, lab results, explained to the patient    Disposition Plan:     Status is: Inpatient  Remains inpatient appropriate because:Inpatient level of care appropriate due to severity of illness  Dispo: The patient is from: Home              Anticipated d/c is to: Home              Patient currently is not medically stable to d/c.   Difficult to place patient No      Time Spent in minutes   25 minutes  Procedures:  CT abdomen  Consultants:   General surgery Oncology  Antimicrobials:   Anti-infectives (From admission, onward)    Start     Dose/Rate Route Frequency Ordered Stop   12/07/20 1700  cefTRIAXone (ROCEPHIN) 2 g in sodium chloride 0.9 % 100 mL IVPB       Note to Pharmacy: Pharmacy may adjust dosing strength, schedule, rate of infusion, etc as needed to  optimize therapy   2 g 200 mL/hr over 30 Minutes Intravenous Every 24 hours 12/06/20 1836     12/06/20 1900  cefTRIAXone (ROCEPHIN) 2 g in sodium chloride 0.9 % 100 mL IVPB  Status:  Discontinued        2 g 200 mL/hr over 30 Minutes Intravenous Every 24 hours 12/06/20 1855 12/06/20 1919   12/06/20 1715  cefTRIAXone (ROCEPHIN) 2 g in sodium chloride 0.9 % 100 mL IVPB        2 g 200 mL/hr over 30 Minutes Intravenous  Once 12/06/20 1710 12/06/20 1801          Medications  Scheduled Meds: Continuous Infusions:  cefTRIAXone (ROCEPHIN)  IV     heparin 1,150 Units/hr (12/07/20 0600)   lactated ringers 125 mL/hr at 12/07/20 0506   PRN Meds:.HYDROmorphone (DILAUDID) injection      Subjective:   Oskar Cretella was seen and examined today.  Still has abdominal pain, no chest pain, dizziness, shortness of breath.  No active nausea or vomiting.  No fevers.   Objective:   Vitals:   12/07/20 0600 12/07/20 0630 12/07/20 0700 12/07/20 1030  BP: (!) 144/81 (!) 148/84 136/83 122/69  Pulse: (!) 116 (!) 119 (!) 119 (!) 102  Resp: (!) 21 18 18 18   Temp:      TempSrc:      SpO2: 93% 95% 95% 90%  Weight:      Height:       No intake or output data in the 24 hours ending 12/07/20 1247   Wt Readings from Last 3 Encounters:  12/06/20 73.6 kg  12/03/20 73.6 kg  12/01/20 73.4 kg     Exam General: Alert and oriented x 3, NAD Cardiovascular: S1 S2 auscultated, tachycardia RRR Respiratory: CTA B Gastrointestinal: Soft, RUQ TTP  nondistended, + bowel sounds Ext: 1+ pedal edema right lower extremity Neuro: no new deficits Psych: Normal affect and demeanor, alert and oriented x3    Data Reviewed:  I have personally reviewed following labs and imaging studies  Micro Results Recent Results (from the past 240 hour(s))  Resp Panel by RT-PCR (Flu A&B, Covid) Nasopharyngeal Swab     Status: None   Collection Time: 12/06/20  5:22 PM   Specimen: Nasopharyngeal Swab; Nasopharyngeal(NP)  swabs in vial transport medium  Result Value Ref Range Status   SARS Coronavirus 2 by RT PCR NEGATIVE NEGATIVE Final    Comment: (NOTE) SARS-CoV-2 target nucleic acids are NOT DETECTED.  The SARS-CoV-2 RNA is generally detectable in upper respiratory specimens during the acute phase of infection. The lowest concentration of SARS-CoV-2 viral copies this assay can detect is 138 copies/mL. A negative result does not preclude  SARS-Cov-2 infection and should not be used as the sole basis for treatment or other patient management decisions. A negative result may occur with  improper specimen collection/handling, submission of specimen other than nasopharyngeal swab, presence of viral mutation(s) within the areas targeted by this assay, and inadequate number of viral copies(<138 copies/mL). A negative result must be combined with clinical observations, patient history, and epidemiological information. The expected result is Negative.  Fact Sheet for Patients:  EntrepreneurPulse.com.au  Fact Sheet for Healthcare Providers:  IncredibleEmployment.be  This test is no t yet approved or cleared by the [place] FDA and  has been authorized for detection and/or diagnosis of SARS-CoV-2 by FDA under an Emergency Use Authorization (EUA). This EUA will remain  in effect (meaning this test can be used) for the duration of the COVID-19 declaration under Section 564(b)(1) of the Act, 21 U.S.C.section 360bbb-3(b)(1), unless the authorization is terminated  or revoked sooner.       Influenza A by PCR NEGATIVE NEGATIVE Final   Influenza B by PCR NEGATIVE NEGATIVE Final    Comment: (NOTE) The Xpert Xpress SARS-CoV-2/FLU/RSV plus assay is intended as an aid in the diagnosis of influenza from Nasopharyngeal swab specimens and should not be used as a sole basis for treatment. Nasal washings and aspirates are unacceptable for Xpert Xpress  SARS-CoV-2/FLU/RSV testing.  Fact Sheet for Patients: EntrepreneurPulse.com.au  Fact Sheet for Healthcare Providers: IncredibleEmployment.be  This test is not yet approved or cleared by the [place] FDA and has been authorized for detection and/or diagnosis of SARS-CoV-2 by FDA under an Emergency Use Authorization (EUA). This EUA will remain in effect (meaning this test can be used) for the duration of the COVID-19 declaration under Section 564(b)(1) of the Act, 21 U.S.C. section 360bbb-3(b)(1), unless the authorization is terminated or revoked.  Performed at [facility], [place] [address], [place], [place] [address]   Culture, blood (routine x 2)     Status: None (Preliminary result)   Collection Time: 12/06/20  7:45 PM   Specimen: BLOOD  Result Value Ref Range Status   Specimen Description   Final    BLOOD RIGHT WRIST Performed at [facility] [address], [place], [place] [address]    Special Requests   Final    BOTTLES DRAWN AEROBIC AND ANAEROBIC Blood Culture results may not be optimal due to an inadequate volume of blood received in culture bottles Performed at [facility] [address], [place], [place] [address]    Culture   Final    NO GROWTH < 12 HOURS Performed at [facility] [address] [place], [place] [address]    Report Status PENDING  Incomplete  Culture, blood (routine x 2)     Status: None (Preliminary result)   Collection Time: 12/06/20  8:00 PM   Specimen: BLOOD  Result Value Ref Range Status   Specimen Description   Final    BLOOD PORTA CATH Performed at [facility] [address] [place], [place] [address]    Special Requests   Final    BOTTLES DRAWN AEROBIC AND ANAEROBIC Blood Culture adequate volume Performed at [facility] [address], [place], [place] [address]    Culture   Final     NO GROWTH < 12 HOURS Performed at [facility] [address] [place], [place] [address]    Report Status PENDING  Incomplete    Radiology Reports DG  Chest 2 View  Result Date: 12/06/2020 CLINICAL DATA:  Chest pain. EXAM: CHEST - 2 VIEW COMPARISON:  November 23, 2020. FINDINGS: The heart size and mediastinal contours are within normal limits. Status post cardiac valve repair. Right internal jugular Port-A-Cath is unchanged in position. No pneumothorax is noted. Left lung is clear. Stable probable scarring is noted in the right lung base. The visualized skeletal structures are unremarkable. IMPRESSION: Stable right lung scarring.  No acute abnormality seen. Electronically Signed   By: Marijo Conception M.D.   On: 12/06/2020 11:38   CT Angio Chest PE W/Cm &/Or Wo Cm  Result Date: 12/06/2020 CLINICAL DATA:  Concern for pulmonary embolism. History of lung cancer with chemotherapy ongoing. EXAM: CT ANGIOGRAPHY CHEST WITH CONTRAST TECHNIQUE: Multidetector CT imaging of the chest was performed using the standard protocol during bolus administration of intravenous contrast. Multiplanar CT image reconstructions and MIPs were obtained to evaluate the vascular anatomy. CONTRAST:  167mL OMNIPAQUE IOHEXOL 350 MG/ML SOLN COMPARISON:  CT chest 11/01/2020 FINDINGS: Cardiovascular: No filling defects within the pulmonary arteries to suggest acute pulmonary embolism. Post CABG anatomy. Mediastinum/Nodes: Port in the anterior chest wall with tip in distal SVC. No axillary or supraclavicular adenopathy. No mediastinal or hilar adenopathy. No pericardial fluid. Esophagus normal. Lungs/Pleura: Volume loss in the RIGHT hemithorax. Suture line in the upper lobe. No suspicious nodularity. Thickening along the fissure is similar prior. LEFT lung is mildly hyperinflated. No acute findings. Upper Abdomen: Limited view of the liver, kidneys, pancreas are unremarkable. Normal adrenal glands. Musculoskeletal: No aggressive  osseous lesion. Review of the MIP images confirms the above findings. IMPRESSION: 1. No evidence acute pulmonary embolism. 2. No pulmonary infarction or pneumonia. 3. Postsurgical change in the RIGHT hemithorax without evidence local lung cancer recurrence. Electronically Signed   By: Suzy Bouchard M.D.   On: 12/06/2020 15:09   NM Hepatobiliary Liver Func  Result Date: 12/07/2020 CLINICAL DATA:  Biliary colic, recurrent, gallbladder dyskinesia suspected EXAM: NUCLEAR MEDICINE HEPATOBILIARY IMAGING TECHNIQUE: Sequential images of the abdomen were obtained out to 60 minutes following intravenous administration of radiopharmaceutical. RADIOPHARMACEUTICALS:  5.5 mCi Tc-33m  Choletec IV COMPARISON:  12/06/2020 FINDINGS: Prompt uptake and biliary excretion of activity by the liver is seen. Gallbladder activity is visualized, consistent with patency of cystic duct. Biliary activity passes into small bowel, consistent with patent common bile duct. IMPRESSION: Normal exam.  Patency of the cystic duct and common bile duct. Electronically Signed   By: Davina Poke D.O.   On: 12/07/2020 10:01   US Abdomen Limited  Result Date: 12/06/2020 CLINICAL DATA:  Right upper quadrant pain EXAM: ULTRASOUND ABDOMEN LIMITED RIGHT UPPER QUADRANT COMPARISON:  12/06/2020 FINDINGS: Gallbladder: Gallbladder is well distended with evidence of gallbladder sludge. No wall thickening or pericholecystic fluid is noted. Common bile duct: Diameter: Prominent up to 1.2 cm with distal normal tapering. No intrahepatic ductal dilatation is noted. Liver: No focal lesion identified. Within normal limits in parenchymal echogenicity. Portal vein is patent on color Doppler imaging with normal direction of blood flow towards the liver. Other: None. IMPRESSION: Mild gallbladder sludge. Mildly prominent common bile duct centrally with normal distal tapering similar to that seen on prior CT. No intrahepatic ductal dilatation is noted. Electronically  Signed   By: Inez Catalina M.D.   On: 12/06/2020 16:52   DG Chest Port 1 View  Result Date: 11/23/2020 CLINICAL DATA:  History of lung cancer. EXAM: PORTABLE CHEST 1 VIEW COMPARISON:  Chest x-ray 09/07/2020 FINDINGS: The cardiac silhouette,  mediastinal and hilar contours are within normal limits and stable. Stable surgical changes from cardiac surgery. Right IJ power port in good position without complicating features. Chronic pulmonary scarring changes.  Chronic right-sided pleural-. IMPRESSION: Chronic lung changes but no acute pulmonary findings. Electronically Signed   By: Marijo Sanes M.D.   On: 11/23/2020 17:24   VAS [place] LOWER EXTREMITY VENOUS (DVT)  Result Date: 11/23/2020  Lower Venous DVT Study Patient Name:  Marcus [name]  Date of Exam:   11/23/2020 Medical Rec #: [id number]      Accession #:    [id number] Date of Birth: 1945/06/22       Patient Gender: M Patient Age:   15 years Exam Location:  [facility] Procedure:      VAS [place] LOWER EXTREMITY VENOUS (DVT) Referring Phys: [facility] [name] --------------------------------------------------------------------------------  Indications: Patient presents today with complaints of acute onset of right lower extremity swelling x 3 days, and right groin pain x 1 day. He denies any symptoms involving the left lower extremity. He denies any SOB.  Risk Factors: Cancer of the lung and currently doing chemotherapy since August 2021. Anticoagulation: Coumadin.  Performing Technologist: Sharlett Iles RVT  Examination Guidelines: A complete evaluation includes B-mode imaging, spectral Doppler, color Doppler, and power Doppler as needed of all accessible portions of each vessel. Bilateral testing is considered an integral part of a complete examination. Limited examinations for reoccurring indications may be performed as noted. The reflux portion of the exam is performed with the patient in reverse Trendelenburg.   +---------+---------------+---------+-----------+---------------+--------------+ RIGHT    CompressibilityPhasicitySpontaneityProperties     Thrombus Aging +---------+---------------+---------+-----------+---------------+--------------+ CFV      None           No       No         softly         Age                                                        echogenic and  Indeterminate                                              brightly                                                                  echogenic                     +---------+---------------+---------+-----------+---------------+--------------+ SFJ      None           No       No         softly         Acute  echogenic                     +---------+---------------+---------+-----------+---------------+--------------+ FV Prox  None           No       No         softly         Age                                                        echogenic and  Indeterminate                                              brightly                      +---------+---------------+---------+-----------+---------------+--------------+ FV Mid   None           No       No         softly         Acute                                                      echogenic                     +---------+---------------+---------+-----------+---------------+--------------+ FV DistalNone           No       No         softly         Acute                                                      echogenic                     +---------+---------------+---------+-----------+---------------+--------------+ PFV      None           No       No         softly         Age                                                        echogenic and  Indeterminate                                              brightly  echogenic                     +---------+---------------+---------+-----------+---------------+--------------+ POP      None           No       No         softly         Acute                                                      echogenic                     +---------+---------------+---------+-----------+---------------+--------------+ PTV      None           No       No         softly         Acute                                                      echogenic                     +---------+---------------+---------+-----------+---------------+--------------+ PERO     None           No       No         softly         Acute                                                      echogenic                     +---------+---------------+---------+-----------+---------------+--------------+ Soleal   None           No       No         softly         Acute                                                      echogenic                     +---------+---------------+---------+-----------+---------------+--------------+ Gastroc  None           No       No         softly         Acute                                                      echogenic                     +---------+---------------+---------+-----------+---------------+--------------+  GSV      None           No       No         softly         Acute                                                      echogenic                     +---------+---------------+---------+-----------+---------------+--------------+ SSV      None           No       No         softly         Age                                                        echogenic      Indeterminate  +---------+---------------+---------+-----------+---------------+--------------+ SPJ      None           No       No         softly         Acute                                                       echogenic                     +---------+---------------+---------+-----------+---------------+--------------+ TPT      None           No       No         softly         Acute                                                      echogenic                     +---------+---------------+---------+-----------+---------------+--------------+   Right Technical Findings: Acute non-occlusive thrombus in the distal external iliac vein. Acute occlusive thrombus noted in the SFJ, GSV, proximal to distal FV, popliteal vein, gastrocnemius vein, TPT, PTV, popliteal vein and SPJ. Age Indeterminate occlusive thrombus noted in the  CFV, ostial FV, DFV and proximal SSV.  +---------+---------------+---------+-----------+---------------+--------------+ LEFT     CompressibilityPhasicitySpontaneityProperties     Thrombus Aging +---------+---------------+---------+-----------+---------------+--------------+ CFV      Full           Yes      Yes                                      +---------+---------------+---------+-----------+---------------+--------------+ SFJ      Full  Yes      Yes                                      +---------+---------------+---------+-----------+---------------+--------------+ FV Prox  Full           Yes      Yes                                      +---------+---------------+---------+-----------+---------------+--------------+ FV Mid   Full           Yes      Yes                                      +---------+---------------+---------+-----------+---------------+--------------+ FV DistalFull           Yes      Yes                                      +---------+---------------+---------+-----------+---------------+--------------+ PFV      None           No       No         softly         Acute                                                      echogenic                      +---------+---------------+---------+-----------+---------------+--------------+ POP      Full           Yes      Yes                                      +---------+---------------+---------+-----------+---------------+--------------+ PTV      None           No       No         softly         Acute                                                      echogenic                     +---------+---------------+---------+-----------+---------------+--------------+ PERO     None           No       No         softly         Acute  echogenic                     +---------+---------------+---------+-----------+---------------+--------------+ Soleal   None           No       No         softly         Acute                                                      echogenic                     +---------+---------------+---------+-----------+---------------+--------------+ Gastroc  Full           Yes      Yes                                      +---------+---------------+---------+-----------+---------------+--------------+ GSV      None           No       No         softly         Acute                                                      echogenic                     +---------+---------------+---------+-----------+---------------+--------------+ SSV      None           No       No         softly         Acute                                                      echogenic                     +---------+---------------+---------+-----------+---------------+--------------+ TPT      None           No       No         softly         Acute                                                      echogenic                     +---------+---------------+---------+-----------+---------------+--------------+  Left Technical Findings: Acute occlusive thrombus in the proximal GSV, not invading  the SFJ (.37 cm from junction). Acute occlusive thrombus in the DFV, TPT, PTV, peroneal veins, SSV and soleal vein.  Findings reported to Dr. Gardiner Rhyme at 2:00 pm.  Summary: RIGHT: - Findings consistent with acute  deep vein thrombosis involving the SF junction, right femoral vein, right popliteal vein, right posterior tibial veins, right peroneal veins, right soleal veins, right gastrocnemius veins, and TPT and SPJ. - Findings consistent with acute superficial vein thrombosis involving the right great saphenous vein. - Findings consistent with age indeterminate deep vein thrombosis involving the right common femoral vein, right femoral vein, and right proximal profunda vein. - Findings consistent with age indeterminate superficial vein thrombosis involving the right small saphenous vein. - No cystic structure found in the popliteal fossa. - Findings consistent with acute deep vein thrombosis involving the distal external iliac vein.  LEFT: - Findings consistent with acute deep vein thrombosis involving the left proximal profunda vein, left posterior tibial veins, left peroneal veins, left soleal veins, and TPT. - Findings consistent with acute superficial vein thrombosis involving the left great saphenous vein, and left small saphenous vein. - No cystic structure found in the popliteal fossa.  Incidental findings: Abnormal dilatation of the proximal abdominal aorta, measuring 3.2 cm AP x 3.2 cm TRV. Suggest follow-up for AAA duplex. *See table(s) above for measurements and observations. Electronically signed by Ida Rogue MD on 11/23/2020 at 6:44:57 PM.    Final    CT Angio Abd/Pel W and/or Wo Contrast  Result Date: 12/06/2020 CLINICAL DATA:  Abdominal pain and shortness of breath History of abdominal aortic aneurysm EXAM: CTA ABDOMEN AND PELVIS WITHOUT AND WITH CONTRAST TECHNIQUE: Multidetector CT imaging of the abdomen and pelvis was performed using the standard protocol during bolus administration of  intravenous contrast. Multiplanar reconstructed images and MIPs were obtained and reviewed to evaluate the vascular anatomy. CONTRAST:  153mL OMNIPAQUE IOHEXOL 350 MG/ML SOLN COMPARISON:  CT abdomen pelvis 08/01/2020 FINDINGS: VASCULAR Aorta: Diffuse atherosclerotic plaque of the abdominal aorta with aneurysm measuring up to 3.2 x 2.5 cm which is similar in size compared to prior examination where it measured 3.0 x 2.7 cm when measured in a similar manner. Celiac: Mild narrowing of the origin of the celiac artery. Evaluation of the branches of the celiac artery limited due to suboptimal timing of contrast bolus. SMA: No significant stenosis. Renals: Mild narrowing at the origin of the renal arteries. IMA: Patent. Inflow: The right common iliac artery is mildly prominent measuring up to 1.7 cm compared to 1.6 cm on the prior study. There is likely at least moderate stenosis at the origin of the right internal iliac artery. No significant stenosis of the right external iliac or common femoral arteries. The left common iliac artery is markedly tortuous with maximum diameter measuring 2.3 cm the overall diameter is not significantly changed since the prior examination. This is likely the site of focal dissection with partially thrombosed false lumen given appearance on prior CT. 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 No significant stenosis of the left external iliac, internal iliac or common femoral arteries. Proximal Outflow: No significant narrowing of visualized right proximal of flow. There is moderate to severe stenosis of the origin of the left superficial femoral artery. Veins: Acute thrombus noted within the right common femoral vein. Review of the MIP images confirms the above findings. NON-VASCULAR Lower chest: No acute abnormality. Hepatobiliary: No focal hepatic lesion. Mild thickening of the  wall the gallbladder. Minimal perihepatic ascites. No bile duct dilatation. Portal vein is patent. Pancreas: Unremarkable. No pancreatic ductal dilatation or surrounding inflammatory changes. Spleen: Normal in size without focal  abnormality. Adrenals/Urinary Tract: Adrenal glands are normal. Bilateral simple renal cysts are again seen. No significant abnormality of the bladder or ureters. Stomach/Bowel: Distal colonic diverticulosis without evidence of acute diverticulitis. Distal colonic diverticulosis without evidence of acute diverticulitis. There has been interval development of omental caking with greatest concentration along the splenic flexure of the colon best seen on images 36-81 of series 4. Lymphatic: No enlarged abdominal or pelvic lymph nodes. Reproductive: Prostate is unremarkable. Other: Small amount of free fluid seen at the pelvic floor. Musculoskeletal: No acute or significant osseous findings. IMPRESSION: VASCULAR 1. Acute right common femoral deep venous thrombosis. 2. Unchanged abdominal aortic aneurysm measuring up to 3.2 x 2.5 cm. Recommend follow-up every 3 years. This recommendation follows ACR consensus guidelines: White Paper of the ACR Incidental Findings Committee II on Vascular Findings. J Am Coll Radiol 2013; 10:789-794. 3. 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 4. 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 5. Please note evaluation arterial vasculature is limited due to suboptimal contrast bolus timing. NON-VASCULAR 1. Mild gallbladder wall thickening is likely reactive. If the patient has symptoms of acute cholecystitis, further evaluation with right upper quadrant ultrasound should be performed. 2. Distal colonic diverticulosis without evidence of acute diverticulitis. 3. Interval development of left upper quadrant omental caking suspicious for progressive metastatic disease. These  results were called by telephone at the time of interpretation on 12/06/2020 at 3:55 pm to provider Lacretia Leigh , who verbally acknowledged these results. Electronically Signed   By: Miachel Roux M.D.   On: 12/06/2020 15:57    Lab Data:  CBC: Recent Labs  Lab 12/01/20 0856 12/06/20 1214 12/07/20 0359  WBC 5.9 14.5* 9.5  NEUTROABS 4.6  --   --   HGB 9.0* 8.4* 7.7*  HCT 27.0* 25.6* 23.9*  MCV 90.6 92.4 94.1  PLT 184 203 196   Basic Metabolic Panel: Recent Labs  Lab 12/01/20 0856 12/06/20 1214 12/07/20 0359  NA 135 132* 137  K 4.2 3.9 4.1  CL 102 98 105  CO2 25 24 25   GLUCOSE 100* 95 96  BUN 16 25* 19  CREATININE 0.79 0.93 0.79  CALCIUM 8.7* 8.2* 8.2*   GFR: Estimated Creatinine Clearance: 82.4 mL/min (by C-G formula based on SCr of 0.79 mg/dL). Liver Function Tests: Recent Labs  Lab 12/01/20 0856 12/06/20 1214 12/07/20 0359  AST 67* 139* 86*  ALT 50* 226* 160*  ALKPHOS 119 153* 115  BILITOT 0.8 1.3* 0.9  PROT 6.7 6.6 6.0*  ALBUMIN 2.6* 2.6* 2.3*   No results for input(s): LIPASE, AMYLASE in the last 168 hours. No results for input(s): AMMONIA in the last 168 hours. Coagulation Profile: Recent Labs  Lab 12/06/20 1214  INR 1.1   Cardiac Enzymes: No results for input(s): CKTOTAL, CKMB, CKMBINDEX, TROPONINI in the last 168 hours. BNP (last 3 results) No results for input(s): PROBNP in the last 8760 hours. HbA1C: No results for input(s): HGBA1C in the last 72 hours. CBG: No results for input(s): GLUCAP in the last 168 hours. Lipid Profile: No results for input(s): CHOL, HDL, LDLCALC, TRIG, CHOLHDL, LDLDIRECT in the last 72 hours. Thyroid Function Tests: No results for input(s): TSH, T4TOTAL, FREET4, T3FREE, THYROIDAB in the last 72 hours. Anemia Panel: No results for input(s): VITAMINB12, FOLATE, FERRITIN, TIBC, IRON, RETICCTPCT in the last 72 hours. Urine analysis:    Component Value Date/Time   COLORURINE YELLOW 12/06/2020 1644  APPEARANCEUR HAZY  (A) 12/06/2020 1644   LABSPEC 1.043 (H) 12/06/2020 1644   PHURINE 6.0 12/06/2020 1644   GLUCOSEU NEGATIVE 12/06/2020 1644   GLUCOSEU NEGATIVE 08/20/2020 0957   HGBUR NEGATIVE 12/06/2020 [place] 12/06/2020 1644   BILIRUBINUR negative 08/13/2020 0947   KETONESUR 5 (A) 12/06/2020 1644   PROTEINUR NEGATIVE 12/06/2020 1644   UROBILINOGEN 0.2 08/20/2020 0957   NITRITE NEGATIVE 12/06/2020 [place] 12/06/2020 1644     Milind Raether M.D. [facility] 12/07/2020, 12:47 PM  Available via Epic secure chat 7am-7pm After 7 pm, please refer to night coverage provider listed on amion.

## 2020-12-07 NOTE — Progress Notes (Signed)
ANTICOAGULATION CONSULT NOTE - Follow Up Consult  Pharmacy Consult for Heparin Indication: DVT, hx mechanical AVR  Allergies  Allergen Reactions   Amlodipine Anaphylaxis    Weakness and fatigue, also    Sulfa Antibiotics Shortness Of Breath   Tetanus Toxoids Anaphylaxis   Crestor [Rosuvastatin] Other (See Comments)    Myalgia    Penicillins Hives    Has patient had a PCN reaction causing immediate rash, facial/tongue/throat swelling, SOB or lightheadedness with hypotension: No Has patient had a PCN reaction causing severe rash involving mucus membranes or skin necrosis: Yes Has patient had a PCN reaction that required hospitalization: No Has patient had a PCN reaction occurring within the last 10 years: No If all of the above answers are "NO", then may proceed with Cephalosporin use.     Patient Measurements: Height: 5\' 10"  (177.8 cm) Weight: 73.6 kg (162 lb 3.2 oz) IBW/kg (Calculated) : 73 Heparin Dosing Weight: TBW  Vital Signs: BP: 122/69 (08/30 1030) Pulse Rate: 102 (08/30 1030)  Labs: Recent Labs    12/06/20 1214 12/06/20 1534 12/06/20 1904 12/07/20 0358 12/07/20 0359  HGB 8.4*  --   --   --  7.7*  HCT 25.6*  --   --   --  23.9*  PLT 203  --   --   --  156  APTT  --   --  54*  --   --   LABPROT 14.6  --   --   --   --   INR 1.1  --   --   --   --   HEPARINUNFRC  --   --   --  0.71*  --   CREATININE 0.93  --   --   --  0.79  TROPONINIHS 15 16  --   --   --     Estimated Creatinine Clearance: 82.4 mL/min (by C-G formula based on SCr of 0.79 mg/dL).   Medications:  Infusions:   cefTRIAXone (ROCEPHIN)  IV     heparin 1,150 Units/hr (12/07/20 0600)   lactated ringers 125 mL/hr at 12/07/20 3536    Assessment: 50 yoM admitted for possible cholecystectomy.  Pharmacy is consulted to dose heparin peri-operatively while chronic Lovenox is on hold.   PTA Lovenox 80 mg [place] BID.  LD on 8/29 at 0900.  Baseline CBC:  Hgb low at 8.4, Plt 203  Today,  12/07/2020: Heparin level 0.4 CBC:  Hgb down to 7.7, Plt 156 No bleeding or complications reported.   Goal of Therapy:  Heparin level 0.3-0.7 units/ml Monitor platelets by anticoagulation protocol: Yes   Plan:  Continue heparin IV infusion at 1150 units/hr Per Dr Tana Coast, change back to Lovenox 1 mg/kg (75 mg) [place] q12h    Gretta Arab PharmD, BCPS Clinical Pharmacist [facility] main pharmacy [phone] 12/07/2020 11:27 AM

## 2020-12-07 NOTE — Progress Notes (Signed)
HIDA negative for acute cholecystitis, suspect mild gallbladder wall thickening seen on previous imaging is reactive from ascites. Ok to advance diet as tolerated and no further need for antibiotics from a surgical standpoint. No indication for surgical intervention - general surgery will sign off. Please call if we can be of further assistance.  Norm Parcel, [facility] 12/07/2020, 11:12 AM Please see Amion for pager number during day hours 7:00am-4:30pm

## 2020-12-07 NOTE — Progress Notes (Signed)
   12/07/20 1303  Assess: MEWS Score  Temp 99 F (37.2 C)  BP 131/73  Pulse Rate (!) 107  Resp (!) 24  Level of Consciousness Alert  SpO2 94 %  O2 Device Room Air  Assess: MEWS Score  MEWS Temp 0  MEWS Systolic 0  MEWS Pulse 1  MEWS RR 1  MEWS LOC 0  MEWS Score 2  MEWS Score Color Yellow  Assess: if the MEWS score is Yellow or Red  Were vital signs taken at a resting state? Yes  Focused Assessment No change from prior assessment  Does the patient have a confirmed or suspected source of infection? No  MEWS guidelines implemented *See Row Information* Yes  Take Vital Signs  Increase Vital Sign Frequency  Yellow: Q 2hr X 2 then Q 4hr X 2, if remains yellow, continue Q 4hrs  Escalate  MEWS: Escalate Yellow: discuss with charge nurse/RN and consider discussing with provider and RRT  Notify: Charge Nurse/RN  Name of Charge Nurse/RN Notified Marissa Long, RN  Date Charge Nurse/RN Notified 12/07/20  Time Charge Nurse/RN Notified 1400

## 2020-12-07 NOTE — Progress Notes (Signed)
[place] for Heparin Indication: DVT, hx of mechanical AVR   Allergies  Allergen Reactions   Amlodipine Anaphylaxis    Weakness and fatigue, also    Sulfa Antibiotics Shortness Of Breath   Tetanus Toxoids Anaphylaxis   Crestor [Rosuvastatin] Other (See Comments)    Myalgia    Penicillins Hives    Has patient had a PCN reaction causing immediate rash, facial/tongue/throat swelling, SOB or lightheadedness with hypotension: No Has patient had a PCN reaction causing severe rash involving mucus membranes or skin necrosis: Yes Has patient had a PCN reaction that required hospitalization: No Has patient had a PCN reaction occurring within the last 10 years: No If all of the above answers are "NO", then may proceed with Cephalosporin use.     Patient Measurements: Height: 5\' 10"  (177.8 cm) Weight: 73.6 kg (162 lb 3.2 oz) IBW/kg (Calculated) : 73 Heparin Dosing Weight: TBW  Vital Signs: BP: 121/78 (08/30 0400) Pulse Rate: 106 (08/30 0400)  Labs: Recent Labs    12/06/20 1214 12/06/20 1534 12/06/20 1904 12/07/20 0358 12/07/20 0359  HGB 8.4*  --   --   --  7.7*  HCT 25.6*  --   --   --  23.9*  PLT 203  --   --   --  156  APTT  --   --  54*  --   --   LABPROT 14.6  --   --   --   --   INR 1.1  --   --   --   --   HEPARINUNFRC  --   --   --  0.71*  --   CREATININE 0.93  --   --   --  0.79  TROPONINIHS 15 16  --   --   --      Estimated Creatinine Clearance: 82.4 mL/min (by C-G formula based on SCr of 0.79 mg/dL).   Medical History: Past Medical History:  Diagnosis Date   Atrial fibrillation ([place])    DVT (deep venous thrombosis) (HCC)    Dyspnea    with exertion    Dysrhythmia    Hypertension    Obstructive sleep apnea 10/25/2007   cpap   S/P AVR    2D ECHO, 04/25/2011 - EF >55%, Right ventricle-mild-moderately dilated   Swelling of limb    LEA VENOUS, 08/22/2009 - no evidence of deep vein or superficial thrombosis; partially  rupturing Baker's Cyst    Medications:  Scheduled:  Infusions:   cefTRIAXone (ROCEPHIN)  IV     heparin 1,300 Units/hr (12/06/20 1948)   lactated ringers 125 mL/hr at 12/07/20 0506    Assessment: 34 yoM admitted for possible cholecystectomy.  Pharmacy is consulted to dose heparin peri-operatively while chronic Lovenox is on hold.   PTA Lovenox 80 mg [place] BID.  LD on 8/29 at 0900.  Baseline labs: INR 1.1, CBC:  Hgb low at 8.4, Plt 203  12/07/2020: Heparin level 0.71- slightly above goal range on IV heparin 1300 units/hr No bleeding or complications reported CBC: Hg down to 7.7, pltc down to 156  Goal of Therapy:  Heparin level 0.3-0.7 units/ml Monitor platelets by anticoagulation protocol: Yes   Plan:  Decrease heparin IV infusion to 1150 units/hr Recheck Heparin level 8 hours after rate change Daily heparin level and CBC Follow up surgical plans.    Kern [place], BCPS Clinical Pharmacist [facility] main pharmacy [phone] 12/07/2020 5:35 AM

## 2020-12-07 NOTE — Plan of Care (Signed)
  Problem: Education: Goal: Knowledge of General Education information will improve Description: Including pain rating scale, medication(s)/side effects and non-pharmacologic comfort measures Outcome: Progressing   Problem: Clinical Measurements: Goal: Ability to maintain clinical measurements within normal limits will improve Outcome: Progressing Goal: Will remain free from infection Outcome: Progressing Goal: Diagnostic test results will improve Outcome: Progressing Goal: Cardiovascular complication will be avoided Outcome: Progressing   Problem: Activity: Goal: Risk for activity intolerance will decrease Outcome: Progressing   Problem: Nutrition: Goal: Adequate nutrition will be maintained Outcome: Progressing   Problem: Coping: Goal: Level of anxiety will decrease Outcome: Progressing   Problem: Elimination: Goal: Will not experience complications related to bowel motility Outcome: Progressing Goal: Will not experience complications related to urinary retention Outcome: Progressing   Problem: Pain Managment: Goal: General experience of comfort will improve Outcome: Progressing   Problem: Safety: Goal: Ability to remain free from injury will improve Outcome: Progressing   Problem: Skin Integrity: Goal: Risk for impaired skin integrity will decrease Outcome: Progressing

## 2020-12-07 NOTE — ED Notes (Signed)
Patient sitting up in chair

## 2020-12-08 DIAGNOSIS — R531 Weakness: Secondary | ICD-10-CM

## 2020-12-08 DIAGNOSIS — Z515 Encounter for palliative care: Secondary | ICD-10-CM

## 2020-12-08 DIAGNOSIS — Z7901 Long term (current) use of anticoagulants: Secondary | ICD-10-CM

## 2020-12-08 DIAGNOSIS — Z7189 Other specified counseling: Secondary | ICD-10-CM

## 2020-12-08 DIAGNOSIS — I82409 Acute embolism and thrombosis of unspecified deep veins of unspecified lower extremity: Secondary | ICD-10-CM

## 2020-12-08 DIAGNOSIS — I4891 Unspecified atrial fibrillation: Secondary | ICD-10-CM

## 2020-12-08 LAB — CBC
HCT: 25.2 % — ABNORMAL LOW (ref 39.0–52.0)
Hemoglobin: 8.1 g/dL — ABNORMAL LOW (ref 13.0–17.0)
MCH: 29.5 pg (ref 26.0–34.0)
MCHC: 32.1 g/dL (ref 30.0–36.0)
MCV: 91.6 fL (ref 80.0–100.0)
Platelets: 130 10*3/uL — ABNORMAL LOW (ref 150–400)
RBC: 2.75 MIL/uL — ABNORMAL LOW (ref 4.22–5.81)
RDW: 20 % — ABNORMAL HIGH (ref 11.5–15.5)
WBC: 4.6 10*3/uL (ref 4.0–10.5)
nRBC: 0 % (ref 0.0–0.2)

## 2020-12-08 LAB — BPAM RBC
Blood Product Expiration Date: 202209292359
ISSUE DATE / TIME: 202208302029
Unit Type and Rh: 5100

## 2020-12-08 LAB — TYPE AND SCREEN
ABO/RH(D): O POS
Antibody Screen: NEGATIVE
Unit division: 0

## 2020-12-08 LAB — TROPONIN I (HIGH SENSITIVITY)
Troponin I (High Sensitivity): 28 ng/L — ABNORMAL HIGH (ref <18)
Troponin I (High Sensitivity): 27 ng/L — ABNORMAL HIGH (ref <18)

## 2020-12-08 MED ORDER — AMIODARONE HCL IN DEXTROSE 360-4.14 MG/200ML-% IV SOLN
30.0000 mg/h | INTRAVENOUS | Status: DC
  Administered 2020-12-08 – 2020-12-09 (×2): 30 mg/h via INTRAVENOUS
  Filled 2020-12-08 (×2): qty 200

## 2020-12-08 MED ORDER — BISACODYL 10 MG RE SUPP
10.0000 mg | Freq: Once | RECTAL | Status: AC
  Administered 2020-12-09: 10 mg via RECTAL
  Filled 2020-12-08: qty 1

## 2020-12-08 MED ORDER — AMIODARONE HCL IN DEXTROSE 360-4.14 MG/200ML-% IV SOLN
60.0000 mg/h | INTRAVENOUS | Status: DC
  Administered 2020-12-08 (×2): 60 mg/h via INTRAVENOUS
  Filled 2020-12-08: qty 200

## 2020-12-08 MED ORDER — OXYCODONE HCL 5 MG PO TABS: 5.0000 mg | ORAL_TABLET | ORAL | Status: DC | PRN

## 2020-12-08 MED ORDER — LACTATED RINGERS IV SOLN: INTRAVENOUS | Status: DC

## 2020-12-08 MED ORDER — POLYETHYLENE GLYCOL 3350 17 G PO PACK
17.0000 g | PACK | Freq: Every day | ORAL | Status: DC
  Administered 2020-12-08 – 2020-12-09 (×2): 17 g via ORAL
  Filled 2020-12-08 (×3): qty 1

## 2020-12-08 MED ORDER — AMIODARONE LOAD VIA INFUSION
150.0000 mg | Freq: Once | INTRAVENOUS | Status: AC
  Administered 2020-12-08: 150 mg via INTRAVENOUS
  Filled 2020-12-08: qty 83.34

## 2020-12-08 MED ORDER — MORPHINE SULFATE (CONCENTRATE) 10 MG/0.5ML PO SOLN
10.0000 mg | ORAL | Status: DC | PRN
  Administered 2020-12-09 – 2020-12-10 (×2): 10 mg via ORAL
  Filled 2020-12-08 (×5): qty 0.5

## 2020-12-08 MED ORDER — MAGIC MOUTHWASH W/LIDOCAINE
15.0000 mL | Freq: Four times a day (QID) | ORAL | Status: DC
  Administered 2020-12-08 – 2020-12-11 (×10): 15 mL via ORAL
  Filled 2020-12-08 (×19): qty 15

## 2020-12-08 MED ORDER — NITROGLYCERIN 0.4 MG SL SUBL
0.4000 mg | SUBLINGUAL_TABLET | SUBLINGUAL | Status: DC | PRN
  Filled 2020-12-08: qty 1

## 2020-12-08 NOTE — Consult Note (Signed)
                                                                                 Consultation Note Date: 12/08/2020   Patient Name: Marcus [name]  DOB: 12/25/1945  MRN: [id number]  Age / Sex: 74 y.o., male  PCP: Tabori, Katherine E, MD Referring Physician: Memon, Jehanzeb, MD  Reason for Consultation: Establishing goals of care  HPI/Patient Profile: 74 y.o. male  with past medical history of OSA, HTN, COPD, CAD, CABG X2, HLP severe aortic insufficiency s/p mechanical AVR (2008) on Lovenoc, with recurrent stage IIIb lung adenocarcinoma on chemotherapy (Dr. [name]). He was admitted on 12/06/2020 with c/o abdominal pain. Patient received chemo 8/25 without negative side effects. On 8/26, patient c/o severe abdominal pain, had poor PO intake, and progressive weakness. Patient arrived by car with wife to ED with c/o 10/10, sharp upper quadrant abdominal pain for which workup with CT of abdomen for suspicion of cholecystitis was negative. However, CT revealed acute right common femoral DVT and interval development of LUQ omental caking, suspicious for metastatic disease.   Palliative care was consulted to discuss GOC.   Clinical Assessment and Goals of Care: I have reviewed medical records including EPIC notes, labs and imaging, assessed the patient and then met with patient's wife Patty outside of patient's room to discuss diagnosis prognosis, GOC, EOL wishes, disposition and options. Patty requested that we speak away from the patient because she is concerned with how overwhelmed and anxious the patient already is about his current health situation.   I introduced Palliative Medicine as specialized medical care for people living with serious illness. It focuses on providing relief from the symptoms and stress of a serious illness. The goal is to improve quality of life for both the patient and the family.  We discussed a brief life review of the patient. The patient and his wife live in [place], [place]  and have two children - one son local to [place] and one daughter in [place].   As far as functional status, PTA patient was ambulatory and independent with ADLs.  As far as nutritional status, PTA patient has not wanted to eat over the last 3 days. He has not been nausous but had significant diarrhea.   We discussed patient's current illness and what it means in the larger context of patient's on-going co-morbidities. Patty shared that the patient is depressed and worries tremendously about his health. She reports some cognitive deficits that she attributed to the last round of chemo the patient received last Thursday.   Natural disease trajectory and expectations at EOL were discussed. I reviewed that from the CT scan there has been significant disease progression, including caking of LUQ. She asked appropriate questions regarding prognosis and how much time she has left with him. I reviewed that given the caking and changes seen from this CT in comparison to prior CTs that his prognosis is poor. He has also been having ongoing functional decline. She appreciated this honesty and wanted to know next steps. I reviewed that Palliative Medicine Team will continue to follow the patient throughout this hospitalization and disease trajectory. Our priority is to ensure pain   control and symptom management at this time and we will await further treatment options from the primary team and oncology.  I attempted to elicit values and goals of care important to the patient. Patty had documentation of a HCPOA that shared the patient's desire to not want life prolonging measures or life-sustaining procedures if he is terminally ill. I reviewed with the wife that this would indicate that the patient would want to be a DNR. Patty expressed that the patient has stated in the past that he would not want machines or feeding tubes. We discussed that changing his code status from FULL to DNR would be what the patient would want.    The difference between aggressive medical intervention and comfort care was considered in light of the patient's goals of care. I reviewed that we will monitor the patient over the next 24-48 hours to see if he progresses or declines. I mentioned that should aggressive medical treatment not be available that we would review the patient's options for a pathway of comfort and quality of life.   Discussed with patient/family the importance of continued conversation with family and the medical providers regarding overall plan of care and treatment options, ensuring decisions are within the context of the patient's values and GOCs.    Questions and concerns were addressed. The family was encouraged to call with questions or concerns.  Code Status/Advance Care Planning: DNR  Prognosis:   Unable to determine  Discharge Planning: To Be Determined  Primary Diagnoses: Present on Admission:  Sleep apnea  Hypertension  COPD (chronic obstructive pulmonary disease) ([place])  Adenocarcinoma of right lung, stage 3 (HCC)  Adenocarcinoma of lung, stage 3 (HCC)  Recurrent non-small cell lung cancer (HCC)  DVT (deep venous thrombosis) (HCC)  (Resolved) Acute cholecystitis  CAD (coronary artery disease)   Physical Exam Vitals and nursing note reviewed.  HENT:     Head: Normocephalic and atraumatic.  Pulmonary:     Effort: Pulmonary effort is normal.  Abdominal:     Tenderness: There is generalized abdominal tenderness.  Skin:    General: Skin is warm and dry.  Neurological:     Mental Status: He is alert.  Psychiatric:        Behavior: Behavior normal.    Vital Signs: BP 105/69   Pulse 83   Temp 98.2 F (36.8 C)   Resp (!) 26   Ht 5' 10" (1.778 m)   Wt 73 kg   SpO2 95%   BMI 23.10 kg/m  Pain Scale: 0-10   Pain Score: Asleep SpO2: SpO2: 95 % O2 Device:SpO2: 95 % O2 Flow Rate: .   Palliative Assessment/Data:     I discussed this patient's plan of care with bedside RN  Janett Billow.  Thank you for this consult. Palliative medicine will continue to follow and assist holistically.   Time Total: 70 minutes Greater than 50%  of this time was spent counseling and coordinating care related to the above assessment and plan.  Signed by: Loistine Chance MD. Patient was seen along with PMT colleague Jordan Hawks, DNP, FNP-BC Palliative Medicine    Please contact Palliative Medicine Team phone at [phone] for questions and concerns.  For individual provider: See Shea Evans

## 2020-12-08 NOTE — Progress Notes (Signed)
[facility]                                                                              Patient Demographics  Marcus [name], is a 75 y.o. male, DOB - [birth year], ZOX:[id number]  Admit date - 12/06/2020   Admitting Physician Ripudeep Krystal Eaton, MD  Outpatient Primary MD for the patient is [name], MD  Outpatient specialists:   LOS - 2  days   Medical records reviewed and are as summarized below:    Chief Complaint  Patient presents with   Abdominal Pain   Chest Pain       Brief summary   Patient is a 75 year old male with OSA, HTN, recently diagnosed bilateral DVT, COPD, CAD CABG x2, HTN, HLP severe aortic insufficiency status post mechanical AVR in 2008, on indefinite Lovenox, recurrent stage IIIb lung adeno CA on chemotherapy (followed by Dr. Julien Nordmann) was admitted with abdominal pain.  Patient had chemo on 8/25 (Thursday) without any issues.  However next day he started having abdominal pain, and continued to progressively worsen.  Patient reported pain in the right upper quadrant, sharp, intermittent, 10/10, associated with nausea.  No vomiting.  Had 3 episodes of diarrhea with no hematochezia or melena.  No fevers.  Decreased p.o. intake in the last 3 days.   CT abdomen showed acute right common femoral DVT, mild gallbladder wall thickening, likely reactive however possible acute cholecystitis.  Interval development of left upper quadrant omental caking suspicious for progressive metastatic disease. Ultrasound abdomen showed mild gallbladder sludge.  General surgery was consulted  Assessment & Plan    Acute abdominal pain -Initially, concern about acute cholecystitis.  General surgery was consulted.  -placed on n.p.o. status, IV fluids, IV antibiotics, pain control -HIDA scan negative  - per surgery, no need for cholecystectomy, placed on clears -DC IV Rocephin -CT abdomen also showed interval development of omental caking in LUQ suspicious for  progressive metastatic disease -Treat supportively with pain management   Active Problems:   History of recurrent non-small cell lung cancer, adenocarcinoma stage IIIb -Followed by Dr. Julien Nordmann, on chemotherapy, received last on 8/25  -CT abdomen also showed interval development of omental caking in LUQ suspicious for progressive metastatic disease - oncology consulted, overall prognosis is poor -We will need to follow-up with oncology to discuss further treatment options if further treatment is still desired  History of recent DVT -Dopplers on 8/16 showed extensive bilateral deep and superficial vein thrombosis, while on Coumadin -Patient was placed on therapeutic Lovenox indefinitely.    Hypertension, hyperlipidemia -Hold statins due to transaminitis -Holding ramipril for borderline blood pressures   Postoperative atrial fibrillation, mechanical AVR -Currently normal sinus rhythm, history of DCCV in 2009 -He has developed rapid atrial fibrillation on 8/31 -With borderline blood pressures, rate control would be challenging -Started on IV amiodarone   Obstructive sleep apnea -Continue CPAP nightly   COPD --currently stable, no wheezing -Placed on duo nebs as needed  Goals of care -Seen by palliative care to discuss further goals and his wife feels that patient would want DNR -Further goals to be determined as his clinical condition evolves.  Code Status: DNR DVT Prophylaxis:    Therapeutic Lovenox   Level of Care: Level of care: Progressive Family Communication: Discussed all imaging results, lab results, explained to the patient    Disposition Plan:     Status is: Inpatient  Remains inpatient appropriate because:Inpatient level of care appropriate due to severity of illness  Dispo: The patient is from: Home              Anticipated d/c is to: Home              Patient currently is not medically stable to d/c.   Difficult to place patient No      Time Spent  in minutes   25 minutes  Procedures:  CT abdomen  Consultants:   General surgery Oncology Palliative care  Antimicrobials:   Anti-infectives (From admission, onward)    Start     Dose/Rate Route Frequency Ordered Stop   12/07/20 1700  cefTRIAXone (ROCEPHIN) 2 g in sodium chloride 0.9 % 100 mL IVPB  Status:  Discontinued       Note to Pharmacy: Pharmacy may adjust dosing strength, schedule, rate of infusion, etc as needed to optimize therapy   2 g 200 mL/hr over 30 Minutes Intravenous Every 24 hours 12/06/20 1836 12/07/20 1313   12/06/20 1900  cefTRIAXone (ROCEPHIN) 2 g in sodium chloride 0.9 % 100 mL IVPB  Status:  Discontinued        2 g 200 mL/hr over 30 Minutes Intravenous Every 24 hours 12/06/20 1855 12/06/20 1919   12/06/20 1715  cefTRIAXone (ROCEPHIN) 2 g in sodium chloride 0.9 % 100 mL IVPB        2 g 200 mL/hr over 30 Minutes Intravenous  Once 12/06/20 1710 12/06/20 1801          Medications  Scheduled Meds:  sodium chloride   Intravenous Once   bisacodyl  10 mg Rectal Once   Chlorhexidine Gluconate Cloth  6 each Topical Daily   enoxaparin (LOVENOX) injection  75 mg Subcutaneous W29H   folic acid  1 mg Oral Daily   loratadine  10 mg Oral Daily   magic mouthwash w/lidocaine  15 mL Oral QID   mirtazapine  15 mg Oral QHS   polyethylene glycol  17 g Oral Daily   Continuous Infusions:  amiodarone 60 mg/hr (12/08/20 1542)   Followed by   amiodarone     lactated ringers 75 mL/hr at 12/08/20 1553   PRN Meds:.ALPRAZolam, alum & mag hydroxide-simeth, HYDROmorphone (DILAUDID) injection, morphine CONCENTRATE, nitroGLYCERIN, sodium chloride flush      Subjective:   Marcus [name] was seen and examined today.  Still requiring pain medications for abdominal pain.  Describes painful swallowing.  He also has had poor p.o. intake.  Objective:   Vitals:   12/08/20 1700 12/08/20 1800 12/08/20 1900 12/08/20 2000  BP: 112/61 107/70 98/63 104/60  Pulse: (!) 101 98 80  80  Resp: (!) 31 (!) 25 (!) 33 17  Temp:   99.3 F (37.4 C) 98.9 F (37.2 C)  TempSrc:   Oral Oral  SpO2: 98% 95% 95% 94%  Weight:      Height:        Intake/Output Summary (Last 24 hours) at 12/08/2020 2017 Last data filed at 12/08/2020 1800 Gross per 24 hour  Intake 3073.43 ml  Output 1125 ml  Net 1948.43 ml     Wt Readings from Last 3 Encounters:  12/07/20 73 kg  12/03/20 73.6 kg  12/01/20 73.4 kg     Exam General exam: Alert, awake, oriented x 3 Respiratory system: Clear to auscultation. Respiratory effort normal. Cardiovascular system: Irregularly irregular. No murmurs, rubs, gallops. Gastrointestinal system: Abdomen is nondistended, soft and nontender. No organomegaly or masses felt. Normal bowel sounds heard. Central nervous system: Alert and oriented. No focal neurological deficits. Extremities: Bilateral lower extremity edema Skin: No rashes, lesions or ulcers Psychiatry: Judgement and insight appear normal. Mood & affect appropriate.     Data Reviewed:  I have personally reviewed following labs and imaging studies  Micro Results Recent Results (from the past 240 hour(s))  Resp Panel by RT-PCR (Flu A&B, Covid) Nasopharyngeal Swab     Status: None   Collection Time: 12/06/20  5:22 PM   Specimen: Nasopharyngeal Swab; Nasopharyngeal(NP) swabs in vial transport medium  Result Value Ref Range Status   SARS Coronavirus 2 by RT PCR NEGATIVE NEGATIVE Final    Comment: (NOTE) SARS-CoV-2 target nucleic acids are NOT DETECTED.  The SARS-CoV-2 RNA is generally detectable in upper respiratory specimens during the acute phase of infection. The lowest concentration of SARS-CoV-2 viral copies this assay can detect is 138 copies/mL. A negative result does not preclude SARS-Cov-2 infection and should not be used as the sole basis for treatment or other patient management decisions. A negative result may occur with  improper specimen collection/handling, submission of  specimen other than nasopharyngeal swab, presence of viral mutation(s) within the areas targeted by this assay, and inadequate number of viral copies(<138 copies/mL). A negative result must be combined with clinical observations, patient history, and epidemiological information. The expected result is Negative.  Fact Sheet for Patients:  EntrepreneurPulse.com.au  Fact Sheet for Healthcare Providers:  IncredibleEmployment.be  This test is no t yet approved or cleared by the [place] FDA and  has been authorized for detection and/or diagnosis of SARS-CoV-2 by FDA under an Emergency Use Authorization (EUA). This EUA will remain  in effect (meaning this test can be used) for the duration of the COVID-19 declaration under Section 564(b)(1) of the Act, 21 U.S.C.section 360bbb-3(b)(1), unless the authorization is terminated  or revoked sooner.       Influenza A by PCR NEGATIVE NEGATIVE Final   Influenza B by PCR NEGATIVE NEGATIVE Final    Comment: (NOTE) The Xpert Xpress SARS-CoV-2/FLU/RSV plus assay is intended as an aid in the diagnosis of influenza from Nasopharyngeal swab specimens and should not be used as a sole basis for treatment. Nasal washings and aspirates are unacceptable for Xpert Xpress SARS-CoV-2/FLU/RSV testing.  Fact Sheet for Patients: EntrepreneurPulse.com.au  Fact Sheet for Healthcare Providers: IncredibleEmployment.be  This test is not yet approved or cleared by the [place] FDA and has been authorized for detection and/or diagnosis of SARS-CoV-2 by FDA under an Emergency Use Authorization (EUA). This EUA will remain in effect (meaning this test can be used) for the duration of the COVID-19 declaration under Section 564(b)(1) of the Act, 21 U.S.C. section 360bbb-3(b)(1), unless the authorization is terminated or revoked.  Performed at [facility], [place]  [address] [place], [place] [address]   Culture, blood (routine x 2)     Status: None (Preliminary result)   Collection Time: 12/06/20  7:45 PM   Specimen: BLOOD  Result Value Ref Range Status   Specimen Description   Final    BLOOD RIGHT WRIST Performed at [facility] [place] [address] [place], [place] [address]    Special Requests   Final  BOTTLES DRAWN AEROBIC AND ANAEROBIC Blood Culture results may not be optimal due to an inadequate volume of blood received in culture bottles Performed at [facility], [place] [address] [place], [place] [address]    Culture   Final    NO GROWTH 1 DAY Performed at [facility], [place] [address], [place], [place] [address]    Report Status PENDING  Incomplete  Culture, blood (routine x 2)     Status: None (Preliminary result)   Collection Time: 12/06/20  8:00 PM   Specimen: BLOOD  Result Value Ref Range Status   Specimen Description   Final    BLOOD PORTA CATH Performed at [facility] [address] [place], [place] [address]    Special Requests   Final    BOTTLES DRAWN AEROBIC AND ANAEROBIC Blood Culture adequate volume Performed at [facility] [address] [place], [place] [address]    Culture   Final    NO GROWTH 1 DAY Performed at [facility], [place] [address] [place], [place] [address]    Report Status PENDING  Incomplete    Radiology Reports DG Chest 2 View  Result Date: 12/06/2020 CLINICAL DATA:  Chest pain. EXAM: CHEST - 2 VIEW COMPARISON:  November 23, 2020. FINDINGS: The heart size and mediastinal contours are within normal limits. Status post cardiac valve repair. Right internal jugular Port-A-Cath is unchanged in position. No pneumothorax is noted. Left lung is clear. Stable probable scarring is noted in the right lung base. The visualized skeletal structures are unremarkable. IMPRESSION: Stable right lung scarring.  No acute abnormality  seen. Electronically Signed   By: Marijo Conception M.D.   On: 12/06/2020 11:38   CT Angio Chest PE W/Cm &/Or Wo Cm  Result Date: 12/06/2020 CLINICAL DATA:  Concern for pulmonary embolism. History of lung cancer with chemotherapy ongoing. EXAM: CT ANGIOGRAPHY CHEST WITH CONTRAST TECHNIQUE: Multidetector CT imaging of the chest was performed using the standard protocol during bolus administration of intravenous contrast. Multiplanar CT image reconstructions and MIPs were obtained to evaluate the vascular anatomy. CONTRAST:  155mL OMNIPAQUE IOHEXOL 350 MG/ML SOLN COMPARISON:  CT chest 11/01/2020 FINDINGS: Cardiovascular: No filling defects within the pulmonary arteries to suggest acute pulmonary embolism. Post CABG anatomy. Mediastinum/Nodes: Port in the anterior chest wall with tip in distal SVC. No axillary or supraclavicular adenopathy. No mediastinal or hilar adenopathy. No pericardial fluid. Esophagus normal. Lungs/Pleura: Volume loss in the RIGHT hemithorax. Suture line in the upper lobe. No suspicious nodularity. Thickening along the fissure is similar prior. LEFT lung is mildly hyperinflated. No acute findings. Upper Abdomen: Limited view of the liver, kidneys, pancreas are unremarkable. Normal adrenal glands. Musculoskeletal: No aggressive osseous lesion. Review of the MIP images confirms the above findings. IMPRESSION: 1. No evidence acute pulmonary embolism. 2. No pulmonary infarction or pneumonia. 3. Postsurgical change in the RIGHT hemithorax without evidence local lung cancer recurrence. Electronically Signed   By: Suzy Bouchard M.D.   On: 12/06/2020 15:09   NM Hepatobiliary Liver Func  Result Date: 12/07/2020 CLINICAL DATA:  Biliary colic, recurrent, gallbladder dyskinesia suspected EXAM: NUCLEAR MEDICINE HEPATOBILIARY IMAGING TECHNIQUE: Sequential images of the abdomen were obtained out to 60 minutes following intravenous administration of radiopharmaceutical. RADIOPHARMACEUTICALS:  5.5 mCi  Tc-37m  Choletec IV COMPARISON:  12/06/2020 FINDINGS: Prompt uptake and biliary excretion of activity by the liver is seen. Gallbladder activity is visualized, consistent with patency of cystic duct. Biliary activity passes  into small bowel, consistent with patent common bile duct. IMPRESSION: Normal exam.  Patency of the cystic duct and common bile duct. Electronically Signed   By: Davina Poke D.O.   On: 12/07/2020 10:01   US Abdomen Limited  Result Date: 12/06/2020 CLINICAL DATA:  Right upper quadrant pain EXAM: ULTRASOUND ABDOMEN LIMITED RIGHT UPPER QUADRANT COMPARISON:  12/06/2020 FINDINGS: Gallbladder: Gallbladder is well distended with evidence of gallbladder sludge. No wall thickening or pericholecystic fluid is noted. Common bile duct: Diameter: Prominent up to 1.2 cm with distal normal tapering. No intrahepatic ductal dilatation is noted. Liver: No focal lesion identified. Within normal limits in parenchymal echogenicity. Portal vein is patent on color Doppler imaging with normal direction of blood flow towards the liver. Other: None. IMPRESSION: Mild gallbladder sludge. Mildly prominent common bile duct centrally with normal distal tapering similar to that seen on prior CT. No intrahepatic ductal dilatation is noted. Electronically Signed   By: Inez Catalina M.D.   On: 12/06/2020 16:52   DG Chest Port 1 View  Result Date: 11/23/2020 CLINICAL DATA:  History of lung cancer. EXAM: PORTABLE CHEST 1 VIEW COMPARISON:  Chest x-ray 09/07/2020 FINDINGS: The cardiac silhouette, mediastinal and hilar contours are within normal limits and stable. Stable surgical changes from cardiac surgery. Right IJ power port in good position without complicating features. Chronic pulmonary scarring changes.  Chronic right-sided pleural-. IMPRESSION: Chronic lung changes but no acute pulmonary findings. Electronically Signed   By: Marijo Sanes M.D.   On: 11/23/2020 17:24   VAS [place] LOWER EXTREMITY VENOUS (DVT)  Result  Date: 11/23/2020  Lower Venous DVT Study Patient Name:  Marcus [name]  Date of Exam:   11/23/2020 Medical Rec #: [id number]      Accession #:    [id number] Date of Birth: 1945-04-16       Patient Gender: M Patient Age:   18 years Exam Location:  [facility] Procedure:      VAS [place] LOWER EXTREMITY VENOUS (DVT) Referring Phys: [facility] [name] --------------------------------------------------------------------------------  Indications: Patient presents today with complaints of acute onset of right lower extremity swelling x 3 days, and right groin pain x 1 day. He denies any symptoms involving the left lower extremity. He denies any SOB.  Risk Factors: Cancer of the lung and currently doing chemotherapy since August 2021. Anticoagulation: Coumadin.  Performing Technologist: Sharlett Iles RVT  Examination Guidelines: A complete evaluation includes B-mode imaging, spectral Doppler, color Doppler, and power Doppler as needed of all accessible portions of each vessel. Bilateral testing is considered an integral part of a complete examination. Limited examinations for reoccurring indications may be performed as noted. The reflux portion of the exam is performed with the patient in reverse Trendelenburg.  +---------+---------------+---------+-----------+---------------+--------------+ RIGHT    CompressibilityPhasicitySpontaneityProperties     Thrombus Aging +---------+---------------+---------+-----------+---------------+--------------+ CFV      None           No       No         softly         Age                                                        echogenic and  Indeterminate  brightly                                                                  echogenic                     +---------+---------------+---------+-----------+---------------+--------------+ SFJ      None           No       No         softly         Acute                                                       echogenic                     +---------+---------------+---------+-----------+---------------+--------------+ FV Prox  None           No       No         softly         Age                                                        echogenic and  Indeterminate                                              brightly                      +---------+---------------+---------+-----------+---------------+--------------+ FV Mid   None           No       No         softly         Acute                                                      echogenic                     +---------+---------------+---------+-----------+---------------+--------------+ FV DistalNone           No       No         softly         Acute                                                      echogenic                     +---------+---------------+---------+-----------+---------------+--------------+  PFV      None           No       No         softly         Age                                                        echogenic and  Indeterminate                                              brightly                                                                  echogenic                     +---------+---------------+---------+-----------+---------------+--------------+ POP      None           No       No         softly         Acute                                                      echogenic                     +---------+---------------+---------+-----------+---------------+--------------+ PTV      None           No       No         softly         Acute                                                      echogenic                     +---------+---------------+---------+-----------+---------------+--------------+ PERO     None           No       No         softly         Acute                                                       echogenic                     +---------+---------------+---------+-----------+---------------+--------------+ Soleal   None  No       No         softly         Acute                                                      echogenic                     +---------+---------------+---------+-----------+---------------+--------------+ Gastroc  None           No       No         softly         Acute                                                      echogenic                     +---------+---------------+---------+-----------+---------------+--------------+ GSV      None           No       No         softly         Acute                                                      echogenic                     +---------+---------------+---------+-----------+---------------+--------------+ SSV      None           No       No         softly         Age                                                        echogenic      Indeterminate  +---------+---------------+---------+-----------+---------------+--------------+ SPJ      None           No       No         softly         Acute                                                      echogenic                     +---------+---------------+---------+-----------+---------------+--------------+ TPT      None           No       No         softly  Acute                                                      echogenic                     +---------+---------------+---------+-----------+---------------+--------------+   Right Technical Findings: Acute non-occlusive thrombus in the distal external iliac vein. Acute occlusive thrombus noted in the SFJ, GSV, proximal to distal FV, popliteal vein, gastrocnemius vein, TPT, PTV, popliteal vein and SPJ. Age Indeterminate occlusive thrombus noted in the  CFV, ostial FV, DFV and proximal SSV.   +---------+---------------+---------+-----------+---------------+--------------+ LEFT     CompressibilityPhasicitySpontaneityProperties     Thrombus Aging +---------+---------------+---------+-----------+---------------+--------------+ CFV      Full           Yes      Yes                                      +---------+---------------+---------+-----------+---------------+--------------+ SFJ      Full           Yes      Yes                                      +---------+---------------+---------+-----------+---------------+--------------+ FV Prox  Full           Yes      Yes                                      +---------+---------------+---------+-----------+---------------+--------------+ FV Mid   Full           Yes      Yes                                      +---------+---------------+---------+-----------+---------------+--------------+ FV DistalFull           Yes      Yes                                      +---------+---------------+---------+-----------+---------------+--------------+ PFV      None           No       No         softly         Acute                                                      echogenic                     +---------+---------------+---------+-----------+---------------+--------------+ POP      Full           Yes      Yes                                      +---------+---------------+---------+-----------+---------------+--------------+  PTV      None           No       No         softly         Acute                                                      echogenic                     +---------+---------------+---------+-----------+---------------+--------------+ PERO     None           No       No         softly         Acute                                                      echogenic                     +---------+---------------+---------+-----------+---------------+--------------+  Soleal   None           No       No         softly         Acute                                                      echogenic                     +---------+---------------+---------+-----------+---------------+--------------+ Gastroc  Full           Yes      Yes                                      +---------+---------------+---------+-----------+---------------+--------------+ GSV      None           No       No         softly         Acute                                                      echogenic                     +---------+---------------+---------+-----------+---------------+--------------+ SSV      None           No       No         softly         Acute  echogenic                     +---------+---------------+---------+-----------+---------------+--------------+ TPT      None           No       No         softly         Acute                                                      echogenic                     +---------+---------------+---------+-----------+---------------+--------------+  Left Technical Findings: Acute occlusive thrombus in the proximal GSV, not invading the SFJ (.37 cm from junction). Acute occlusive thrombus in the DFV, TPT, PTV, peroneal veins, SSV and soleal vein.  Findings reported to Dr. Gardiner Rhyme at 2:00 pm.  Summary: RIGHT: - Findings consistent with acute deep vein thrombosis involving the SF junction, right femoral vein, right popliteal vein, right posterior tibial veins, right peroneal veins, right soleal veins, right gastrocnemius veins, and TPT and SPJ. - Findings consistent with acute superficial vein thrombosis involving the right great saphenous vein. - Findings consistent with age indeterminate deep vein thrombosis involving the right common femoral vein, right femoral vein, and right proximal profunda vein. - Findings consistent with age indeterminate  superficial vein thrombosis involving the right small saphenous vein. - No cystic structure found in the popliteal fossa. - Findings consistent with acute deep vein thrombosis involving the distal external iliac vein.  LEFT: - Findings consistent with acute deep vein thrombosis involving the left proximal profunda vein, left posterior tibial veins, left peroneal veins, left soleal veins, and TPT. - Findings consistent with acute superficial vein thrombosis involving the left great saphenous vein, and left small saphenous vein. - No cystic structure found in the popliteal fossa.  Incidental findings: Abnormal dilatation of the proximal abdominal aorta, measuring 3.2 cm AP x 3.2 cm TRV. Suggest follow-up for AAA duplex. *See table(s) above for measurements and observations. Electronically signed by Ida Rogue MD on 11/23/2020 at 6:44:57 PM.    Final    CT Angio Abd/Pel W and/or Wo Contrast  Result Date: 12/06/2020 CLINICAL DATA:  Abdominal pain and shortness of breath History of abdominal aortic aneurysm EXAM: CTA ABDOMEN AND PELVIS WITHOUT AND WITH CONTRAST TECHNIQUE: Multidetector CT imaging of the abdomen and pelvis was performed using the standard protocol during bolus administration of intravenous contrast. Multiplanar reconstructed images and MIPs were obtained and reviewed to evaluate the vascular anatomy. CONTRAST:  180mL OMNIPAQUE IOHEXOL 350 MG/ML SOLN COMPARISON:  CT abdomen pelvis 08/01/2020 FINDINGS: VASCULAR Aorta: Diffuse atherosclerotic plaque of the abdominal aorta with aneurysm measuring up to 3.2 x 2.5 cm which is similar in size compared to prior examination where it measured 3.0 x 2.7 cm when measured in a similar manner. Celiac: Mild narrowing of the origin of the celiac artery. Evaluation of the branches of the celiac artery limited due to suboptimal timing of contrast bolus. SMA: No significant stenosis. Renals: Mild narrowing at the origin of the renal arteries. IMA: Patent. Inflow: The  right common iliac artery is mildly prominent measuring up to 1.7 cm compared to 1.6 cm on the prior study. There is likely at least moderate stenosis at the origin of  the right internal iliac artery. No significant stenosis of the right external iliac or common femoral arteries. The left common iliac artery is markedly tortuous with maximum diameter measuring 2.3 cm the overall diameter is not significantly changed since the prior examination. This is likely the site of focal dissection with partially thrombosed false lumen given appearance on prior CT. 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 No significant stenosis of the left external iliac, internal iliac or common femoral arteries. Proximal Outflow: No significant narrowing of visualized right proximal of flow. There is moderate to severe stenosis of the origin of the left superficial femoral artery. Veins: Acute thrombus noted within the right common femoral vein. Review of the MIP images confirms the above findings. NON-VASCULAR Lower chest: No acute abnormality. Hepatobiliary: No focal hepatic lesion. Mild thickening of the wall the gallbladder. Minimal perihepatic ascites. No bile duct dilatation. Portal vein is patent. Pancreas: Unremarkable. No pancreatic ductal dilatation or surrounding inflammatory changes. Spleen: Normal in size without focal abnormality. Adrenals/Urinary Tract: Adrenal glands are normal. Bilateral simple renal cysts are again seen. No significant abnormality of the bladder or ureters. Stomach/Bowel: Distal colonic diverticulosis without evidence of acute diverticulitis. Distal colonic diverticulosis without evidence of acute diverticulitis. There has been interval development of omental caking with greatest concentration along the splenic flexure of the colon best seen on images 36-81 of series 4. Lymphatic: No enlarged  abdominal or pelvic lymph nodes. Reproductive: Prostate is unremarkable. Other: Small amount of free fluid seen at the pelvic floor. Musculoskeletal: No acute or significant osseous findings. IMPRESSION: VASCULAR 1. Acute right common femoral deep venous thrombosis. 2. Unchanged abdominal aortic aneurysm measuring up to 3.2 x 2.5 cm. Recommend follow-up every 3 years. This recommendation follows ACR consensus guidelines: White Paper of the ACR Incidental Findings Committee II on Vascular Findings. J Am Coll Radiol 2013; 10:789-794. 3. 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 4. 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 5. Please note evaluation arterial vasculature is limited due to suboptimal contrast bolus timing. NON-VASCULAR 1. Mild gallbladder wall thickening is likely reactive. If the patient has symptoms of acute cholecystitis, further evaluation with right upper quadrant ultrasound should be performed. 2. Distal colonic diverticulosis without evidence of acute diverticulitis. 3. Interval development of left upper quadrant omental caking suspicious for progressive metastatic disease. These results were called by telephone at the time of interpretation on 12/06/2020 at 3:55 pm to provider Lacretia Leigh , who verbally acknowledged these results. Electronically Signed   By: Miachel Roux M.D.   On: 12/06/2020 15:57    Lab Data:  CBC: Recent Labs  Lab 12/06/20 1214 12/07/20 0359 12/08/20 0320  WBC 14.5* 9.5 4.6  HGB 8.4* 7.7* 8.1*  HCT 25.6* 23.9* 25.2*  MCV 92.4 94.1 91.6  PLT 203 156 161*   Basic Metabolic Panel: Recent Labs  Lab 12/06/20 1214 12/07/20 0359  NA 132* 137  K 3.9 4.1  CL 98 105  CO2 24 25  GLUCOSE 95 96  BUN 25* 19  CREATININE 0.93 0.79  CALCIUM 8.2* 8.2*   GFR: Estimated Creatinine Clearance: 82.4 mL/min (by C-G formula based on SCr of 0.79 mg/dL). Liver Function  Tests: Recent Labs  Lab 12/06/20 1214 12/07/20 0359  AST 139* 86*  ALT 226* 160*  ALKPHOS 153* 115  BILITOT 1.3* 0.9  PROT 6.6 6.0*  ALBUMIN 2.6* 2.3*   No results for input(s): LIPASE, AMYLASE in the last 168 hours. No results for input(s): AMMONIA in the last 168 hours. Coagulation Profile: Recent Labs  Lab 12/06/20 1214  INR 1.1   Cardiac Enzymes: No results for input(s): CKTOTAL, CKMB, CKMBINDEX, TROPONINI in the last 168 hours. BNP (last 3 results) No results for input(s): PROBNP in the last 8760 hours. HbA1C: No results for input(s): HGBA1C in the last 72 hours. CBG: No results for input(s): GLUCAP in the last 168 hours. Lipid Profile: No results for input(s): CHOL, HDL, LDLCALC, TRIG, CHOLHDL, LDLDIRECT in the last 72 hours. Thyroid Function Tests: No results for input(s): TSH, T4TOTAL, FREET4, T3FREE, THYROIDAB in the last 72 hours. Anemia Panel: No results for input(s): VITAMINB12, FOLATE, FERRITIN, TIBC, IRON, RETICCTPCT in the last 72 hours. Urine analysis:    Component Value Date/Time   COLORURINE YELLOW 12/06/2020 1644   APPEARANCEUR HAZY (A) 12/06/2020 1644   LABSPEC 1.043 (H) 12/06/2020 1644   PHURINE 6.0 12/06/2020 1644   GLUCOSEU NEGATIVE 12/06/2020 1644   GLUCOSEU NEGATIVE 08/20/2020 0957   HGBUR NEGATIVE 12/06/2020 [place] 12/06/2020 1644   BILIRUBINUR negative 08/13/2020 0947   KETONESUR 5 (A) 12/06/2020 1644   PROTEINUR NEGATIVE 12/06/2020 1644   UROBILINOGEN 0.2 08/20/2020 0957   NITRITE NEGATIVE 12/06/2020 [place] 12/06/2020 1644     Kathie Dike M.D. [facility] 12/08/2020, 8:17 PM  Available via Epic secure chat 7am-7pm After 7 pm, please refer to night coverage provider listed on amion.

## 2020-12-08 NOTE — Progress Notes (Signed)
Pt c/o left sided chest burning that radiates from upper abdomen. 8/10, constant pain. IV Dilaudid given per PRN orders. Also noted to have converted to atrial fib on tele from NSR. HR maintaining in the 90's-120's. BP stable. Dr. Roderic Palau paged and made aware of the above findings. Will continue to monitor.

## 2020-12-08 NOTE — Progress Notes (Signed)
   12/08/20 1542  Assess: MEWS Score  Temp 98.2 F (36.8 C)  BP 105/69  ECG Heart Rate (!) 120  Resp (!) 26  Level of Consciousness Alert  SpO2 95 %  O2 Device Room Air  Assess: MEWS Score  MEWS Temp 0  MEWS Systolic 0  MEWS Pulse 2  MEWS RR 2  MEWS LOC 0  MEWS Score 4  MEWS Score Color Red  Assess: if the MEWS score is Yellow or Red  Were vital signs taken at a resting state? Yes  Focused Assessment Change from prior assessment (see assessment flowsheet)  Does the patient meet 2 or more of the SIRS criteria? Yes  Does the patient have a confirmed or suspected source of infection? No  MEWS guidelines implemented *See Row Information* Yes  Treat  MEWS Interventions Administered scheduled meds/treatments  Take Vital Signs  Increase Vital Sign Frequency  Red: Q 1hr X 4 then Q 4hr X 4, if remains red, continue Q 4hrs  Escalate  MEWS: Escalate Red: discuss with charge nurse/RN and provider, consider discussing with RRT  Notify: Charge Nurse/RN  Name of Charge Nurse/RN Notified Rise Paganini, RN  Date Charge Nurse/RN Notified 12/08/20  Time Charge Nurse/RN Notified 76  Notify: Provider  Provider Name/Title Dr. Roderic Palau  Date Provider Notified 12/08/20  Time Provider Notified 1510  Notification Type Page  Notification Reason Change in status  Provider response See new orders  Date of Provider Response 12/08/20  Time of Provider Response 1510  Notify: Rapid Response  Name of Rapid Response RN Notified Sarah  Date Rapid Response Notified 12/08/20  Time Rapid Response Notified 6712  Document  Patient Outcome Stabilized after interventions  Progress note created (see row info) Yes  Assess: SIRS CRITERIA  SIRS Temperature  0  SIRS Pulse 1  SIRS Respirations  1  SIRS WBC 0  SIRS Score Sum  2

## 2020-12-08 NOTE — Progress Notes (Signed)
Pt's BP 90/60's and HR 150's-170's on tele, a.fib. Dr. Roderic Palau made aware and new orders for amio gtt placed. Agricultural consultant and RRT RN made aware.

## 2020-12-09 ENCOUNTER — Other Ambulatory Visit: Payer: Medicare HMO

## 2020-12-09 LAB — COMPREHENSIVE METABOLIC PANEL
ALT: 89 U/L — ABNORMAL HIGH (ref 0–44)
AST: 53 U/L — ABNORMAL HIGH (ref 15–41)
Albumin: 1.9 g/dL — ABNORMAL LOW (ref 3.5–5.0)
Alkaline Phosphatase: 152 U/L — ABNORMAL HIGH (ref 38–126)
Anion gap: 7 (ref 5–15)
BUN: 18 mg/dL (ref 8–23)
CO2: 25 mmol/L (ref 22–32)
Calcium: 7.8 mg/dL — ABNORMAL LOW (ref 8.9–10.3)
Chloride: 106 mmol/L (ref 98–111)
Creatinine, Ser: 0.73 mg/dL (ref 0.61–1.24)
GFR, Estimated: >60 mL/min (ref >60)
Glucose, Bld: 121 mg/dL — ABNORMAL HIGH (ref 70–99)
Potassium: 3.6 mmol/L (ref 3.5–5.1)
Sodium: 138 mmol/L (ref 135–145)
Total Bilirubin: 0.7 mg/dL (ref 0.3–1.2)
Total Protein: 5.4 g/dL — ABNORMAL LOW (ref 6.5–8.1)

## 2020-12-09 LAB — CBC
HCT: 25 % — ABNORMAL LOW (ref 39.0–52.0)
Hemoglobin: 8.4 g/dL — ABNORMAL LOW (ref 13.0–17.0)
MCH: 30.5 pg (ref 26.0–34.0)
MCHC: 33.6 g/dL (ref 30.0–36.0)
MCV: 90.9 fL (ref 80.0–100.0)
Platelets: 121 10*3/uL — ABNORMAL LOW (ref 150–400)
RBC: 2.75 MIL/uL — ABNORMAL LOW (ref 4.22–5.81)
RDW: 19.9 % — ABNORMAL HIGH (ref 11.5–15.5)
WBC: 1.9 10*3/uL — ABNORMAL LOW (ref 4.0–10.5)
nRBC: 0 % (ref 0.0–0.2)

## 2020-12-09 MED ORDER — METOPROLOL TARTRATE 25 MG PO TABS
12.5000 mg | ORAL_TABLET | Freq: Two times a day (BID) | ORAL | Status: DC
  Administered 2020-12-09 – 2020-12-10 (×3): 12.5 mg via ORAL
  Filled 2020-12-09 (×3): qty 1

## 2020-12-09 MED ORDER — FLUTICASONE PROPIONATE 50 MCG/ACT NA SUSP
2.0000 | Freq: Every day | NASAL | Status: DC
  Administered 2020-12-09 – 2020-12-12 (×4): 2 via NASAL
  Filled 2020-12-09: qty 16

## 2020-12-09 NOTE — Telephone Encounter (Signed)
ok 

## 2020-12-09 NOTE — Progress Notes (Signed)
[facility]                                                                              Patient Demographics  Marcus [name], is a 75 y.o. male, DOB - 1945-10-28, GEZ:[id number]  Admit date - 12/06/2020   Admitting Physician Ripudeep Krystal Eaton, MD  Outpatient Primary MD for the patient is [name], MD  Outpatient specialists:   LOS - 3  days   Medical records reviewed and are as summarized below:    Chief Complaint  Patient presents with   Abdominal Pain   Chest Pain       Brief summary   Patient is a 75 year old male with OSA, HTN, recently diagnosed bilateral DVT, COPD, CAD CABG x2, HTN, HLP severe aortic insufficiency status post mechanical AVR in 2008, on indefinite Lovenox, recurrent stage IIIb lung adeno CA on chemotherapy (followed by Dr. Julien Nordmann) was admitted with abdominal pain.  Patient had chemo on 8/25 (Thursday) without any issues.  However next day he started having abdominal pain, and continued to progressively worsen.  Patient reported pain in the right upper quadrant, sharp, intermittent, 10/10, associated with nausea.  No vomiting.  Had 3 episodes of diarrhea with no hematochezia or melena.  No fevers.  Decreased p.o. intake in the last 3 days.   CT abdomen showed acute right common femoral DVT, mild gallbladder wall thickening, likely reactive however possible acute cholecystitis.  Interval development of left upper quadrant omental caking suspicious for progressive metastatic disease. Ultrasound abdomen showed mild gallbladder sludge.  General surgery was consulted  Assessment & Plan    Acute abdominal pain -Initially, concern about acute cholecystitis.  General surgery was consulted.  -placed on n.p.o. status, IV fluids, IV antibiotics, pain control -HIDA scan negative  - per surgery, no need for cholecystectomy, placed on clears -DC IV Rocephin -CT abdomen also showed interval development of omental caking in LUQ suspicious for  progressive metastatic disease -Treat supportively with pain management   Active Problems:   History of recurrent non-small cell lung cancer, adenocarcinoma stage IIIb -Followed by Dr. Julien Nordmann, on chemotherapy, received last on 8/25  -CT abdomen also showed interval development of omental caking in LUQ suspicious for progressive metastatic disease - oncology consulted, overall prognosis is poor -We will need to follow-up with oncology to discuss further treatment options if further treatment is still desired  History of recent DVT -Dopplers on 8/16 showed extensive bilateral deep and superficial vein thrombosis, while on Coumadin -Patient was placed on therapeutic Lovenox indefinitely.    Hypertension, hyperlipidemia -Hold statins due to transaminitis -Holding ramipril for borderline blood pressures   Postoperative atrial fibrillation, mechanical AVR -Currently normal sinus rhythm, history of DCCV in 2009 -He has developed rapid atrial fibrillation on 8/31 -With borderline blood pressures, rate control would be challenging -Started on IV amiodarone -now converted back to sinus rhythm -discontinue amiodarone and started on metoprolol   Obstructive sleep apnea -Continue CPAP nightly   COPD --currently stable, no wheezing -Placed on duo nebs as needed  Goals of care -Seen by palliative care to discuss further goals and his wife feels that patient would  want DNR -Further goals to be determined as his clinical condition evolves. -family meeting set up for 9/2   Code Status: DNR DVT Prophylaxis:    Therapeutic Lovenox   Level of Care: Level of care: Progressive Family Communication: Discussed all imaging results, lab results, explained to the patient    Disposition Plan:     Status is: Inpatient  Remains inpatient appropriate because:Inpatient level of care appropriate due to severity of illness  Dispo: The patient is from: Home              Anticipated d/c is to:  Home              Patient currently is not medically stable to d/c.   Difficult to place patient No      Time Spent in minutes   25 minutes  Procedures:  CT abdomen  Consultants:   General surgery Oncology Palliative care  Antimicrobials:   Anti-infectives (From admission, onward)    Start     Dose/Rate Route Frequency Ordered Stop   12/07/20 1700  cefTRIAXone (ROCEPHIN) 2 g in sodium chloride 0.9 % 100 mL IVPB  Status:  Discontinued       Note to Pharmacy: Pharmacy may adjust dosing strength, schedule, rate of infusion, etc as needed to optimize therapy   2 g 200 mL/hr over 30 Minutes Intravenous Every 24 hours 12/06/20 1836 12/07/20 1313   12/06/20 1900  cefTRIAXone (ROCEPHIN) 2 g in sodium chloride 0.9 % 100 mL IVPB  Status:  Discontinued        2 g 200 mL/hr over 30 Minutes Intravenous Every 24 hours 12/06/20 1855 12/06/20 1919   12/06/20 1715  cefTRIAXone (ROCEPHIN) 2 g in sodium chloride 0.9 % 100 mL IVPB        2 g 200 mL/hr over 30 Minutes Intravenous  Once 12/06/20 1710 12/06/20 1801          Medications  Scheduled Meds:  Chlorhexidine Gluconate Cloth  6 each Topical Daily   enoxaparin (LOVENOX) injection  75 mg Subcutaneous Q12H   fluticasone  2 spray Each Nare Daily   folic acid  1 mg Oral Daily   loratadine  10 mg Oral Daily   magic mouthwash w/lidocaine  15 mL Oral QID   metoprolol tartrate  12.5 mg Oral BID   mirtazapine  15 mg Oral QHS   polyethylene glycol  17 g Oral Daily   Continuous Infusions:   PRN Meds:.ALPRAZolam, alum & mag hydroxide-simeth, HYDROmorphone (DILAUDID) injection, morphine CONCENTRATE, nitroGLYCERIN, sodium chloride flush      Subjective:   Onur Mori was seen and examined today.  Feeling better today. Abd pain is better. Po intake improving  Objective:   Vitals:   12/09/20 0602 12/09/20 1200 12/09/20 1400 12/09/20 1600  BP:  107/74 (!) 111/91 103/68  Pulse: 72 84 92 80  Resp: 19 (!) 21 (!) 22 (!) 24  Temp:   98.1 F (36.7 C)    TempSrc:  Oral    SpO2: 92% 98% 100% 96%  Weight:      Height:        Intake/Output Summary (Last 24 hours) at 12/09/2020 2040 Last data filed at 12/09/2020 2000 Gross per 24 hour  Intake 1409.23 ml  Output 450 ml  Net 959.23 ml     Wt Readings from Last 3 Encounters:  12/07/20 73 kg  12/03/20 73.6 kg  12/01/20 73.4 kg     Exam General exam: Alert, awake, oriented x  3 Respiratory system: Clear to auscultation. Respiratory effort normal. Cardiovascular system:RRR. No murmurs, rubs, gallops. Gastrointestinal system: Abdomen is nondistended, soft and nontender. No organomegaly or masses felt. Normal bowel sounds heard. Central nervous system: Alert and oriented. No focal neurological deficits. Extremities: No C/C/E, +pedal pulses Skin: No rashes, lesions or ulcers Psychiatry: Judgement and insight appear normal. Mood & affect appropriate.    Data Reviewed:  I have personally reviewed following labs and imaging studies  Micro Results Recent Results (from the past 240 hour(s))  Resp Panel by RT-PCR (Flu A&B, Covid) Nasopharyngeal Swab     Status: None   Collection Time: 12/06/20  5:22 PM   Specimen: Nasopharyngeal Swab; Nasopharyngeal(NP) swabs in vial transport medium  Result Value Ref Range Status   SARS Coronavirus 2 by RT PCR NEGATIVE NEGATIVE Final    Comment: (NOTE) SARS-CoV-2 target nucleic acids are NOT DETECTED.  The SARS-CoV-2 RNA is generally detectable in upper respiratory specimens during the acute phase of infection. The lowest concentration of SARS-CoV-2 viral copies this assay can detect is 138 copies/mL. A negative result does not preclude SARS-Cov-2 infection and should not be used as the sole basis for treatment or other patient management decisions. A negative result may occur with  improper specimen collection/handling, submission of specimen other than nasopharyngeal swab, presence of viral mutation(s) within the areas targeted  by this assay, and inadequate number of viral copies(<138 copies/mL). A negative result must be combined with clinical observations, patient history, and epidemiological information. The expected result is Negative.  Fact Sheet for Patients:  EntrepreneurPulse.com.au  Fact Sheet for Healthcare Providers:  IncredibleEmployment.be  This test is no t yet approved or cleared by the [place] FDA and  has been authorized for detection and/or diagnosis of SARS-CoV-2 by FDA under an Emergency Use Authorization (EUA). This EUA will remain  in effect (meaning this test can be used) for the duration of the COVID-19 declaration under Section 564(b)(1) of the Act, 21 U.S.C.section 360bbb-3(b)(1), unless the authorization is terminated  or revoked sooner.       Influenza A by PCR NEGATIVE NEGATIVE Final   Influenza B by PCR NEGATIVE NEGATIVE Final    Comment: (NOTE) The Xpert Xpress SARS-CoV-2/FLU/RSV plus assay is intended as an aid in the diagnosis of influenza from Nasopharyngeal swab specimens and should not be used as a sole basis for treatment. Nasal washings and aspirates are unacceptable for Xpert Xpress SARS-CoV-2/FLU/RSV testing.  Fact Sheet for Patients: EntrepreneurPulse.com.au  Fact Sheet for Healthcare Providers: IncredibleEmployment.be  This test is not yet approved or cleared by the [place] FDA and has been authorized for detection and/or diagnosis of SARS-CoV-2 by FDA under an Emergency Use Authorization (EUA). This EUA will remain in effect (meaning this test can be used) for the duration of the COVID-19 declaration under Section 564(b)(1) of the Act, 21 U.S.C. section 360bbb-3(b)(1), unless the authorization is terminated or revoked.  Performed at [facility], [place] [address], [place], [place] [address]   Culture, blood (routine x 2)     Status: None (Preliminary  result)   Collection Time: 12/06/20  7:45 PM   Specimen: BLOOD  Result Value Ref Range Status   Specimen Description   Final    BLOOD RIGHT WRIST Performed at [facility] [address], [place],  [address]    Special Requests   Final    BOTTLES DRAWN AEROBIC AND ANAEROBIC Blood Culture results may not be optimal due to an inadequate  volume of blood received in culture bottles Performed at [facility] [address] [place], [place] [address]    Culture   Final    NO GROWTH 2 DAYS Performed at [facility] [address], [place], [place] [address]    Report Status PENDING  Incomplete  Culture, blood (routine x 2)     Status: None (Preliminary result)   Collection Time: 12/06/20  8:00 PM   Specimen: BLOOD  Result Value Ref Range Status   Specimen Description   Final    BLOOD PORTA CATH Performed at [facility] [address] [place], [place] [address]    Special Requests   Final    BOTTLES DRAWN AEROBIC AND ANAEROBIC Blood Culture adequate volume Performed at [facility] [address], [place], [place] [address]    Culture   Final    NO GROWTH 2 DAYS Performed at [facility] [address] [place], [place] [address]    Report Status PENDING  Incomplete    Radiology Reports DG Chest 2 View  Result Date: 12/06/2020 CLINICAL DATA:  Chest pain. EXAM: CHEST - 2 VIEW COMPARISON:  November 23, 2020. FINDINGS: The heart size and mediastinal contours are within normal limits. Status post cardiac valve repair. Right internal jugular Port-A-Cath is unchanged in position. No pneumothorax is noted. Left lung is clear. Stable probable scarring is noted in the right lung base. The visualized skeletal structures are unremarkable. IMPRESSION: Stable right lung scarring.  No acute abnormality seen. Electronically Signed   By: Marijo Conception M.D.   On: 12/06/2020 11:38   CT Angio Chest  PE W/Cm &/Or Wo Cm  Result Date: 12/06/2020 CLINICAL DATA:  Concern for pulmonary embolism. History of lung cancer with chemotherapy ongoing. EXAM: CT ANGIOGRAPHY CHEST WITH CONTRAST TECHNIQUE: Multidetector CT imaging of the chest was performed using the standard protocol during bolus administration of intravenous contrast. Multiplanar CT image reconstructions and MIPs were obtained to evaluate the vascular anatomy. CONTRAST:  181mL OMNIPAQUE IOHEXOL 350 MG/ML SOLN COMPARISON:  CT chest 11/01/2020 FINDINGS: Cardiovascular: No filling defects within the pulmonary arteries to suggest acute pulmonary embolism. Post CABG anatomy. Mediastinum/Nodes: Port in the anterior chest wall with tip in distal SVC. No axillary or supraclavicular adenopathy. No mediastinal or hilar adenopathy. No pericardial fluid. Esophagus normal. Lungs/Pleura: Volume loss in the RIGHT hemithorax. Suture line in the upper lobe. No suspicious nodularity. Thickening along the fissure is similar prior. LEFT lung is mildly hyperinflated. No acute findings. Upper Abdomen: Limited view of the liver, kidneys, pancreas are unremarkable. Normal adrenal glands. Musculoskeletal: No aggressive osseous lesion. Review of the MIP images confirms the above findings. IMPRESSION: 1. No evidence acute pulmonary embolism. 2. No pulmonary infarction or pneumonia. 3. Postsurgical change in the RIGHT hemithorax without evidence local lung cancer recurrence. Electronically Signed   By: Suzy Bouchard M.D.   On: 12/06/2020 15:09   NM Hepatobiliary Liver Func  Result Date: 12/07/2020 CLINICAL DATA:  Biliary colic, recurrent, gallbladder dyskinesia suspected EXAM: NUCLEAR MEDICINE HEPATOBILIARY IMAGING TECHNIQUE: Sequential images of the abdomen were obtained out to 60 minutes following intravenous administration of radiopharmaceutical. RADIOPHARMACEUTICALS:  5.5 mCi Tc-15m  Choletec IV COMPARISON:  12/06/2020 FINDINGS: Prompt uptake and biliary excretion of  activity by the liver is seen. Gallbladder activity is visualized, consistent with patency of cystic duct. Biliary activity passes into small bowel, consistent with patent common bile duct. IMPRESSION: Normal exam.  Patency of the  cystic duct and common bile duct. Electronically Signed   By: Davina Poke D.O.   On: 12/07/2020 10:01   US Abdomen Limited  Result Date: 12/06/2020 CLINICAL DATA:  Right upper quadrant pain EXAM: ULTRASOUND ABDOMEN LIMITED RIGHT UPPER QUADRANT COMPARISON:  12/06/2020 FINDINGS: Gallbladder: Gallbladder is well distended with evidence of gallbladder sludge. No wall thickening or pericholecystic fluid is noted. Common bile duct: Diameter: Prominent up to 1.2 cm with distal normal tapering. No intrahepatic ductal dilatation is noted. Liver: No focal lesion identified. Within normal limits in parenchymal echogenicity. Portal vein is patent on color Doppler imaging with normal direction of blood flow towards the liver. Other: None. IMPRESSION: Mild gallbladder sludge. Mildly prominent common bile duct centrally with normal distal tapering similar to that seen on prior CT. No intrahepatic ductal dilatation is noted. Electronically Signed   By: Inez Catalina M.D.   On: 12/06/2020 16:52   DG Chest Port 1 View  Result Date: 11/23/2020 CLINICAL DATA:  History of lung cancer. EXAM: PORTABLE CHEST 1 VIEW COMPARISON:  Chest x-ray 09/07/2020 FINDINGS: The cardiac silhouette, mediastinal and hilar contours are within normal limits and stable. Stable surgical changes from cardiac surgery. Right IJ power port in good position without complicating features. Chronic pulmonary scarring changes.  Chronic right-sided pleural-. IMPRESSION: Chronic lung changes but no acute pulmonary findings. Electronically Signed   By: Marijo Sanes M.D.   On: 11/23/2020 17:24   VAS [place] LOWER EXTREMITY VENOUS (DVT)  Result Date: 11/23/2020  Lower Venous DVT Study Patient Name:  JEX STRAUSBAUGH Devol  Date of Exam:    11/23/2020 Medical Rec #: [id number]      Accession #:    [id number] Date of Birth: [birth year]       Patient Gender: M Patient Age:   50 years Exam Location:  [facility] Procedure:      VAS [place] LOWER EXTREMITY VENOUS (DVT) Referring Phys: [facility] [name] --------------------------------------------------------------------------------  Indications: Patient presents today with complaints of acute onset of right lower extremity swelling x 3 days, and right groin pain x 1 day. He denies any symptoms involving the left lower extremity. He denies any SOB.  Risk Factors: Cancer of the lung and currently doing chemotherapy since August 2021. Anticoagulation: Coumadin.  Performing Technologist: Sharlett Iles RVT  Examination Guidelines: A complete evaluation includes B-mode imaging, spectral Doppler, color Doppler, and power Doppler as needed of all accessible portions of each vessel. Bilateral testing is considered an integral part of a complete examination. Limited examinations for reoccurring indications may be performed as noted. The reflux portion of the exam is performed with the patient in reverse Trendelenburg.  +---------+---------------+---------+-----------+---------------+--------------+ RIGHT    CompressibilityPhasicitySpontaneityProperties     Thrombus Aging +---------+---------------+---------+-----------+---------------+--------------+ CFV      None           No       No         softly         Age                                                        echogenic and  Indeterminate  brightly                                                                  echogenic                     +---------+---------------+---------+-----------+---------------+--------------+ SFJ      None           No       No         softly         Acute                                                      echogenic                      +---------+---------------+---------+-----------+---------------+--------------+ FV Prox  None           No       No         softly         Age                                                        echogenic and  Indeterminate                                              brightly                      +---------+---------------+---------+-----------+---------------+--------------+ FV Mid   None           No       No         softly         Acute                                                      echogenic                     +---------+---------------+---------+-----------+---------------+--------------+ FV DistalNone           No       No         softly         Acute                                                      echogenic                     +---------+---------------+---------+-----------+---------------+--------------+  PFV      None           No       No         softly         Age                                                        echogenic and  Indeterminate                                              brightly                                                                  echogenic                     +---------+---------------+---------+-----------+---------------+--------------+ POP      None           No       No         softly         Acute                                                      echogenic                     +---------+---------------+---------+-----------+---------------+--------------+ PTV      None           No       No         softly         Acute                                                      echogenic                     +---------+---------------+---------+-----------+---------------+--------------+ PERO     None           No       No         softly         Acute                                                      echogenic                      +---------+---------------+---------+-----------+---------------+--------------+ Soleal   None  No       No         softly         Acute                                                      echogenic                     +---------+---------------+---------+-----------+---------------+--------------+ Gastroc  None           No       No         softly         Acute                                                      echogenic                     +---------+---------------+---------+-----------+---------------+--------------+ GSV      None           No       No         softly         Acute                                                      echogenic                     +---------+---------------+---------+-----------+---------------+--------------+ SSV      None           No       No         softly         Age                                                        echogenic      Indeterminate  +---------+---------------+---------+-----------+---------------+--------------+ SPJ      None           No       No         softly         Acute                                                      echogenic                     +---------+---------------+---------+-----------+---------------+--------------+ TPT      None           No       No         softly  Acute                                                      echogenic                     +---------+---------------+---------+-----------+---------------+--------------+   Right Technical Findings: Acute non-occlusive thrombus in the distal external iliac vein. Acute occlusive thrombus noted in the SFJ, GSV, proximal to distal FV, popliteal vein, gastrocnemius vein, TPT, PTV, popliteal vein and SPJ. Age Indeterminate occlusive thrombus noted in the  CFV, ostial FV, DFV and proximal SSV.  +---------+---------------+---------+-----------+---------------+--------------+ LEFT      CompressibilityPhasicitySpontaneityProperties     Thrombus Aging +---------+---------------+---------+-----------+---------------+--------------+ CFV      Full           Yes      Yes                                      +---------+---------------+---------+-----------+---------------+--------------+ SFJ      Full           Yes      Yes                                      +---------+---------------+---------+-----------+---------------+--------------+ FV Prox  Full           Yes      Yes                                      +---------+---------------+---------+-----------+---------------+--------------+ FV Mid   Full           Yes      Yes                                      +---------+---------------+---------+-----------+---------------+--------------+ FV DistalFull           Yes      Yes                                      +---------+---------------+---------+-----------+---------------+--------------+ PFV      None           No       No         softly         Acute                                                      echogenic                     +---------+---------------+---------+-----------+---------------+--------------+ POP      Full           Yes      Yes                                      +---------+---------------+---------+-----------+---------------+--------------+  PTV      None           No       No         softly         Acute                                                      echogenic                     +---------+---------------+---------+-----------+---------------+--------------+ PERO     None           No       No         softly         Acute                                                      echogenic                     +---------+---------------+---------+-----------+---------------+--------------+ Soleal   None           No       No         softly         Acute                                                       echogenic                     +---------+---------------+---------+-----------+---------------+--------------+ Gastroc  Full           Yes      Yes                                      +---------+---------------+---------+-----------+---------------+--------------+ GSV      None           No       No         softly         Acute                                                      echogenic                     +---------+---------------+---------+-----------+---------------+--------------+ SSV      None           No       No         softly         Acute  echogenic                     +---------+---------------+---------+-----------+---------------+--------------+ TPT      None           No       No         softly         Acute                                                      echogenic                     +---------+---------------+---------+-----------+---------------+--------------+  Left Technical Findings: Acute occlusive thrombus in the proximal GSV, not invading the SFJ (.37 cm from junction). Acute occlusive thrombus in the DFV, TPT, PTV, peroneal veins, SSV and soleal vein.  Findings reported to Dr. Gardiner Rhyme at 2:00 pm.  Summary: RIGHT: - Findings consistent with acute deep vein thrombosis involving the SF junction, right femoral vein, right popliteal vein, right posterior tibial veins, right peroneal veins, right soleal veins, right gastrocnemius veins, and TPT and SPJ. - Findings consistent with acute superficial vein thrombosis involving the right great saphenous vein. - Findings consistent with age indeterminate deep vein thrombosis involving the right common femoral vein, right femoral vein, and right proximal profunda vein. - Findings consistent with age indeterminate superficial vein thrombosis involving the right small saphenous vein. - No cystic structure  found in the popliteal fossa. - Findings consistent with acute deep vein thrombosis involving the distal external iliac vein.  LEFT: - Findings consistent with acute deep vein thrombosis involving the left proximal profunda vein, left posterior tibial veins, left peroneal veins, left soleal veins, and TPT. - Findings consistent with acute superficial vein thrombosis involving the left great saphenous vein, and left small saphenous vein. - No cystic structure found in the popliteal fossa.  Incidental findings: Abnormal dilatation of the proximal abdominal aorta, measuring 3.2 cm AP x 3.2 cm TRV. Suggest follow-up for AAA duplex. *See table(s) above for measurements and observations. Electronically signed by Ida Rogue MD on 11/23/2020 at 6:44:57 PM.    Final    CT Angio Abd/Pel W and/or Wo Contrast  Result Date: 12/06/2020 CLINICAL DATA:  Abdominal pain and shortness of breath History of abdominal aortic aneurysm EXAM: CTA ABDOMEN AND PELVIS WITHOUT AND WITH CONTRAST TECHNIQUE: Multidetector CT imaging of the abdomen and pelvis was performed using the standard protocol during bolus administration of intravenous contrast. Multiplanar reconstructed images and MIPs were obtained and reviewed to evaluate the vascular anatomy. CONTRAST:  153mL OMNIPAQUE IOHEXOL 350 MG/ML SOLN COMPARISON:  CT abdomen pelvis 08/01/2020 FINDINGS: VASCULAR Aorta: Diffuse atherosclerotic plaque of the abdominal aorta with aneurysm measuring up to 3.2 x 2.5 cm which is similar in size compared to prior examination where it measured 3.0 x 2.7 cm when measured in a similar manner. Celiac: Mild narrowing of the origin of the celiac artery. Evaluation of the branches of the celiac artery limited due to suboptimal timing of contrast bolus. SMA: No significant stenosis. Renals: Mild narrowing at the origin of the renal arteries. IMA: Patent. Inflow: The right common iliac artery is mildly prominent measuring up to 1.7 cm compared to 1.6 cm on  the prior study. There is likely at least moderate stenosis at the origin of  the right internal iliac artery. No significant stenosis of the right external iliac or common femoral arteries. The left common iliac artery is markedly tortuous with maximum diameter measuring 2.3 cm the overall diameter is not significantly changed since the prior examination. This is likely the site of focal dissection with partially thrombosed false lumen given appearance on prior CT. 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 No significant stenosis of the left external iliac, internal iliac or common femoral arteries. Proximal Outflow: No significant narrowing of visualized right proximal of flow. There is moderate to severe stenosis of the origin of the left superficial femoral artery. Veins: Acute thrombus noted within the right common femoral vein. Review of the MIP images confirms the above findings. NON-VASCULAR Lower chest: No acute abnormality. Hepatobiliary: No focal hepatic lesion. Mild thickening of the wall the gallbladder. Minimal perihepatic ascites. No bile duct dilatation. Portal vein is patent. Pancreas: Unremarkable. No pancreatic ductal dilatation or surrounding inflammatory changes. Spleen: Normal in size without focal abnormality. Adrenals/Urinary Tract: Adrenal glands are normal. Bilateral simple renal cysts are again seen. No significant abnormality of the bladder or ureters. Stomach/Bowel: Distal colonic diverticulosis without evidence of acute diverticulitis. Distal colonic diverticulosis without evidence of acute diverticulitis. There has been interval development of omental caking with greatest concentration along the splenic flexure of the colon best seen on images 36-81 of series 4. Lymphatic: No enlarged abdominal or pelvic lymph nodes. Reproductive: Prostate is unremarkable. Other: Small amount  of free fluid seen at the pelvic floor. Musculoskeletal: No acute or significant osseous findings. IMPRESSION: VASCULAR 1. Acute right common femoral deep venous thrombosis. 2. Unchanged abdominal aortic aneurysm measuring up to 3.2 x 2.5 cm. Recommend follow-up every 3 years. This recommendation follows ACR consensus guidelines: White Paper of the ACR Incidental Findings Committee II on Vascular Findings. J Am Coll Radiol 2013; 10:789-794. 3. 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 4. 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 5. Please note evaluation arterial vasculature is limited due to suboptimal contrast bolus timing. NON-VASCULAR 1. Mild gallbladder wall thickening is likely reactive. If the patient has symptoms of acute cholecystitis, further evaluation with right upper quadrant ultrasound should be performed. 2. Distal colonic diverticulosis without evidence of acute diverticulitis. 3. Interval development of left upper quadrant omental caking suspicious for progressive metastatic disease. These results were called by telephone at the time of interpretation on 12/06/2020 at 3:55 pm to provider Lacretia Leigh , who verbally acknowledged these results. Electronically Signed   By: Miachel Roux M.D.   On: 12/06/2020 15:57    Lab Data:  CBC: Recent Labs  Lab 12/06/20 1214 12/07/20 0359 12/08/20 0320 12/09/20 0335  WBC 14.5* 9.5 4.6 1.9*  HGB 8.4* 7.7* 8.1* 8.4*  HCT 25.6* 23.9* 25.2* 25.0*  MCV 92.4 94.1 91.6 90.9  PLT 203 156 130* 062*   Basic Metabolic Panel: Recent Labs  Lab 12/06/20 1214 12/07/20 0359 12/09/20 0335  NA 132* 137 138  K 3.9 4.1 3.6  CL 98 105 106  CO2 24 25 25   GLUCOSE 95 96 121*  BUN 25* 19 18  CREATININE 0.93 0.79 0.73  CALCIUM 8.2* 8.2* 7.8*   GFR: Estimated Creatinine Clearance: 82.4 mL/min (by C-G formula based on SCr of 0.73 mg/dL). Liver Function  Tests: Recent Labs  Lab 12/06/20 1214 12/07/20 0359 12/09/20 0335  AST 139* 86* 53*  ALT 226* 160* 89*  ALKPHOS 153* 115 152*  BILITOT 1.3* 0.9 0.7  PROT 6.6 6.0* 5.4*  ALBUMIN 2.6* 2.3* 1.9*   No results for input(s): LIPASE, AMYLASE in the last 168 hours. No results for input(s): AMMONIA in the last 168 hours. Coagulation Profile: Recent Labs  Lab 12/06/20 1214  INR 1.1   Cardiac Enzymes: No results for input(s): CKTOTAL, CKMB, CKMBINDEX, TROPONINI in the last 168 hours. BNP (last 3 results) No results for input(s): PROBNP in the last 8760 hours. HbA1C: No results for input(s): HGBA1C in the last 72 hours. CBG: No results for input(s): GLUCAP in the last 168 hours. Lipid Profile: No results for input(s): CHOL, HDL, LDLCALC, TRIG, CHOLHDL, LDLDIRECT in the last 72 hours. Thyroid Function Tests: No results for input(s): TSH, T4TOTAL, FREET4, T3FREE, THYROIDAB in the last 72 hours. Anemia Panel: No results for input(s): VITAMINB12, FOLATE, FERRITIN, TIBC, IRON, RETICCTPCT in the last 72 hours. Urine analysis:    Component Value Date/Time   COLORURINE YELLOW 12/06/2020 1644   APPEARANCEUR HAZY (A) 12/06/2020 1644   LABSPEC 1.043 (H) 12/06/2020 1644   PHURINE 6.0 12/06/2020 1644   GLUCOSEU NEGATIVE 12/06/2020 1644   GLUCOSEU NEGATIVE 08/20/2020 0957   HGBUR NEGATIVE 12/06/2020 [place] 12/06/2020 1644   BILIRUBINUR negative 08/13/2020 0947   KETONESUR 5 (A) 12/06/2020 1644   PROTEINUR NEGATIVE 12/06/2020 1644   UROBILINOGEN 0.2 08/20/2020 0957   NITRITE NEGATIVE 12/06/2020 [place] 12/06/2020 1644     Kathie Dike M.D. [facility] 12/09/2020, 8:40 PM  Available via Epic secure chat 7am-7pm After 7 pm, please refer to night coverage provider listed on amion.

## 2020-12-09 NOTE — Progress Notes (Signed)
Daily Progress Note   Patient Name: Marcus [name]       Date: 12/09/2020 DOB: 01/28/1946  Age: 75 y.o. MRN#: [id number] Attending Physician: Kathie Dike, MD Primary Care Physician: [name], MD Admit Date: 12/06/2020  Reason for Consultation/Follow-up: Establishing goals of care  Subjective:  Awake alert resting in bed, no distress. Feels better than yesterday, admits to fatigue.   Length of Stay: 3  Current Medications: Scheduled Meds:   bisacodyl  10 mg Rectal Once   Chlorhexidine Gluconate Cloth  6 each Topical Daily   enoxaparin (LOVENOX) injection  75 mg Subcutaneous Y09X   folic acid  1 mg Oral Daily   loratadine  10 mg Oral Daily   magic mouthwash w/lidocaine  15 mL Oral QID   mirtazapine  15 mg Oral QHS   polyethylene glycol  17 g Oral Daily    Continuous Infusions:  amiodarone 30 mg/hr (12/09/20 0627)   lactated ringers 75 mL/hr at 12/09/20 0914    PRN Meds: ALPRAZolam, alum & mag hydroxide-simeth, HYDROmorphone (DILAUDID) injection, morphine CONCENTRATE, nitroGLYCERIN, sodium chloride flush  Physical Exam         Awake alert no distress Irregular  Non labored breathing No edema  Vital Signs: BP 95/61 (BP Location: Right Arm)   Pulse 72   Temp 99.2 F (37.3 C) (Oral)   Resp 19   Ht 5\' 10"  (8.338 m)   Wt 73 kg   SpO2 92%   BMI 23.10 kg/m  SpO2: SpO2: 92 % O2 Device: O2 Device: Room Air O2 Flow Rate:    Intake/output summary:  Intake/Output Summary (Last 24 hours) at 12/09/2020 1117 Last data filed at 12/09/2020 1033 Gross per 24 hour  Intake 2502.49 ml  Output 375 ml  Net 2127.49 ml   LBM: Last BM Date: 12/06/20 Baseline Weight: Weight: 73.6 kg Most recent weight: Weight: 73 kg       Palliative Assessment/Data:      Patient  Active Problem List   Diagnosis Date Noted   Acute abdominal pain 12/06/2020   Failure to thrive in adult 12/06/2020   Inguinal hernia, left - reducible 12/06/2020   Chronic anticoagulation on SQ Lovenox (had DVT on Coumadin) 12/06/2020   Recurrent deep vein thrombosis (DVT) ([place]) 12/06/2020   Omental caking LUQ suspicious for metastatic cancer  12/06/2020   DVT (deep venous thrombosis) ([place]) 11/23/2020   Port-A-Cath in place 10/14/2020   Foot pain, bilateral 10/14/2020   Encounter for antineoplastic immunotherapy 09/23/2020   Antineoplastic chemotherapy induced anemia 09/23/2020   Recurrent non-small cell lung cancer ([place]) 08/25/2020   Goals of care, counseling/discussion 08/25/2020   Anemia due to blood loss 08/13/2020   Coagulopathy ([place]) 08/13/2020   Diarrhea 08/13/2020   Diverticulosis of colon 08/13/2020   Flatulence, eructation and gas pain 08/13/2020   Abdominal pain, diffuse 08/13/2020   Personal history of colonic polyps 08/13/2020   Pleuritic pain 08/10/2020   Decreased appetite 08/10/2020   Adenocarcinoma of lung, stage 3 ([place]) 01/28/2020   Physical exam 01/28/2020   Adenocarcinoma of right lung, stage 3 ([place]) 01/01/2020   Encounter for antineoplastic chemotherapy 01/01/2020   Screening for malignant neoplasm of prostate 12/31/2019   Hypocalcemia 12/31/2019   Hypomagnesemia 12/31/2019   Male hypogonadism 12/31/2019   Presence of prosthetic heart valve 12/31/2019   Thrombocytopenia ([place]) 12/31/2019   Thyromegaly 12/31/2019   Vitamin D deficiency 12/31/2019   Abdominal hernia 06/16/2019   COPD (chronic obstructive pulmonary disease) ([place]) 06/16/2019   Postoperative atrial fibrillation ([place]) 12/22/2012   Sleep apnea 11/01/2012   Bradycardia, severe sinus 09/09/2012   CAD (coronary artery disease) 09/09/2012   S/P thoracic aortic aneurysm repair 09/09/2012   Hx of CABG 09/09/2012   Hypercholesterolemia 09/09/2012   Hypertension 09/09/2012   Other long term  (current) drug therapy 06/28/2012   H/O aortic valve replacement 06/28/2012    Palliative Care Assessment & Plan   Patient Profile:  75 y.o. male  with past medical history of OSA, HTN, COPD, CAD, CABG X2, HLP severe aortic insufficiency s/p mechanical AVR (2008) on Lovenoc, with recurrent stage IIIb lung adenocarcinoma on chemotherapy (Dr. Julien Nordmann). He was admitted on 12/06/2020 with c/o abdominal pain. Patient received chemo 8/25 without negative side effects. On 8/26, patient c/o severe abdominal pain, had poor PO intake, and progressive weakness. Patient arrived by car with wife to ED with c/o 10/10, sharp upper quadrant abdominal pain for which workup with CT of abdomen for suspicion of cholecystitis was negative. However, CT revealed acute right common femoral DVT and interval development of LUQ omental caking, suspicious for metastatic disease.    Palliative care was consulted to discuss [place].   Assessment: Acute abdominal pain much improved, patient underwent CT found to have omental caking left upper quadrant suspicious for progressive metastatic disease. A. fib with mechanical aortic valve, on amiodarone History of recent DVT Ongoing functional decline.  Recommendations/Plan: Goals of care discussions with the patient as well as family are ongoing.  Discussed with patient, discussed with wife both inside and outside the room. Family meeting has been scheduled for 12-10-2020 with the patient's wife and son for further goals of care discussions as per patient's wife's request.  Further recommendations to follow.    Code Status:    Code Status Orders  (From admission, onward)           Start     Ordered   12/08/20 1620  Do not attempt resuscitation (DNR)  Continuous       Question Answer Comment  In the event of cardiac or respiratory ARREST Do not call a "code blue"   In the event of cardiac or respiratory ARREST Do not perform Intubation, CPR, defibrillation or ACLS   In the  event of cardiac or respiratory ARREST Use medication by any route, position, wound care, and other measures  to relive pain and suffering. May use oxygen, suction and manual treatment of airway obstruction as needed for comfort.      12/08/20 1619           Code Status History     Date Active Date Inactive Code Status Order ID Comments User Context   12/06/2020 1855 12/08/2020 1619 Full Code [id number]  Mendel Corning, MD ED   11/23/2020 1931 11/25/2020 1858 Full Code [id number]  Elwyn Reach, MD Inpatient   12/08/2019 1815 12/18/2019 1553 Full Code [id number]  Nani Skillern, PA-C Inpatient      Advance Directive Documentation    Flowsheet Row Most Recent Value  Type of Advance Directive Healthcare Power of Attorney, Living will  Pre-existing out of facility DNR order (yellow form or pink MOST form) --  "MOST" Form in Place? --       Prognosis:  Unable to determine  Discharge Planning: To Be Determined  Care plan was discussed with  wife and patient.   Thank you for allowing the Palliative Medicine Team to assist in the care of this patient.   Time In:  11 Time Out: 11.35 Total Time 35 Prolonged Time Billed  no       Greater than 50%  of this time was spent counseling and coordinating care related to the above assessment and plan.  Loistine Chance, MD  Please contact Palliative Medicine Team phone at [phone] for questions and concerns.

## 2020-12-09 NOTE — Progress Notes (Signed)
Palliative Medicine RN Note: Family gave HCPOA/durable POA to our NP Curt Bears, who saw him with Dr Rowe Pavy. Emailed document to HIM to be placed in chart.   Document names [name] N. Bowmer as primary HCPOA with Dickson as secondary, in the even Ms Poitras is not available.  Marjie Skiff Kayshaun Polanco, RN, BSN, [facility] Palliative Medicine Team 12/09/2020 8:31 AM Office [phone]

## 2020-12-10 DIAGNOSIS — R627 Adult failure to thrive: Secondary | ICD-10-CM

## 2020-12-10 LAB — CBC
HCT: 26.5 % — ABNORMAL LOW (ref 39.0–52.0)
Hemoglobin: 8.7 g/dL — ABNORMAL LOW (ref 13.0–17.0)
MCH: 29.9 pg (ref 26.0–34.0)
MCHC: 32.8 g/dL (ref 30.0–36.0)
MCV: 91.1 fL (ref 80.0–100.0)
Platelets: 106 10*3/uL — ABNORMAL LOW (ref 150–400)
RBC: 2.91 MIL/uL — ABNORMAL LOW (ref 4.22–5.81)
RDW: 20 % — ABNORMAL HIGH (ref 11.5–15.5)
WBC: 2.8 10*3/uL — ABNORMAL LOW (ref 4.0–10.5)
nRBC: 0 % (ref 0.0–0.2)

## 2020-12-10 MED ORDER — METOPROLOL TARTRATE 25 MG PO TABS
25.0000 mg | ORAL_TABLET | Freq: Two times a day (BID) | ORAL | Status: DC
  Administered 2020-12-10 – 2020-12-12 (×5): 25 mg via ORAL
  Filled 2020-12-10 (×5): qty 1

## 2020-12-10 NOTE — Progress Notes (Signed)
Daily Progress Note   Patient Name: Marcus [name]       Date: 12/10/2020 DOB: 09-06-1945  Age: 75 y.o. MRN#: [id number] Attending Physician: Kathie Dike, MD Primary Care Physician: [name], MD Admit Date: 12/06/2020  Reason for Consultation/Follow-up: Establishing goals of care  Subjective:  Awake alert resting in bed, no distress. Son and wife at bedside. A family meeting was held, see below.   Length of Stay: 4  Current Medications: Scheduled Meds:   Chlorhexidine Gluconate Cloth  6 each Topical Daily   enoxaparin (LOVENOX) injection  75 mg Subcutaneous Q12H   fluticasone  2 spray Each Nare Daily   folic acid  1 mg Oral Daily   loratadine  10 mg Oral Daily   magic mouthwash w/lidocaine  15 mL Oral QID   metoprolol tartrate  12.5 mg Oral BID   mirtazapine  15 mg Oral QHS   polyethylene glycol  17 g Oral Daily    Continuous Infusions:    PRN Meds: ALPRAZolam, alum & mag hydroxide-simeth, HYDROmorphone (DILAUDID) injection, morphine CONCENTRATE, nitroGLYCERIN, sodium chloride flush  Physical Exam         Awake alert no distress Irregular  Non labored breathing No edema  Vital Signs: BP 115/74   Pulse 87   Temp 98.1 F (36.7 C) (Oral)   Resp (!) 24   Ht _0  (1.778 m)   Wt 73 kg   SpO2 96%   BMI 23.10 kg/m  SpO2: SpO2: 96 % O2 Device: O2 Device: Room Air O2 Flow Rate:    Intake/output summary:  Intake/Output Summary (Last 24 hours) at 12/10/2020 1249 Last data filed at 12/10/2020 0725 Gross per 24 hour  Intake 240 ml  Output 640 ml  Net -400 ml    LBM: Last BM Date: 12/09/20 Baseline Weight: Weight: 73.6 kg Most recent weight: Weight: 73 kg       Palliative Assessment/Data:      Patient Active Problem List   Diagnosis Date Noted    Acute abdominal pain 12/06/2020   Failure to thrive in adult 12/06/2020   Inguinal hernia, left - reducible 12/06/2020   Chronic anticoagulation on SQ Lovenox (had DVT on Coumadin) 12/06/2020   Recurrent deep vein thrombosis (DVT) ([place]) 12/06/2020   Omental caking LUQ suspicious for metastatic cancer 12/06/2020  DVT (deep venous thrombosis) ([place]) 11/23/2020   Port-A-Cath in place 10/14/2020   Foot pain, bilateral 10/14/2020   Encounter for antineoplastic immunotherapy 09/23/2020   Antineoplastic chemotherapy induced anemia 09/23/2020   Recurrent non-small cell lung cancer ([place]) 08/25/2020   Goals of care, counseling/discussion 08/25/2020   Anemia due to blood loss 08/13/2020   Coagulopathy ([place]) 08/13/2020   Diarrhea 08/13/2020   Diverticulosis of colon 08/13/2020   Flatulence, eructation and gas pain 08/13/2020   Abdominal pain, diffuse 08/13/2020   Personal history of colonic polyps 08/13/2020   Pleuritic pain 08/10/2020   Decreased appetite 08/10/2020   Adenocarcinoma of lung, stage 3 ([place]) 01/28/2020   Physical exam 01/28/2020   Adenocarcinoma of right lung, stage 3 ([place]) 01/01/2020   Encounter for antineoplastic chemotherapy 01/01/2020   Screening for malignant neoplasm of prostate 12/31/2019   Hypocalcemia 12/31/2019   Hypomagnesemia 12/31/2019   Male hypogonadism 12/31/2019   Presence of prosthetic heart valve 12/31/2019   Thrombocytopenia ([place]) 12/31/2019   Thyromegaly 12/31/2019   Vitamin D deficiency 12/31/2019   Abdominal hernia 06/16/2019   COPD (chronic obstructive pulmonary disease) ([place]) 06/16/2019   Postoperative atrial fibrillation ([place]) 12/22/2012   Sleep apnea 11/01/2012   Bradycardia, severe sinus 09/09/2012   CAD (coronary artery disease) 09/09/2012   S/P thoracic aortic aneurysm repair 09/09/2012   Hx of CABG 09/09/2012   Hypercholesterolemia 09/09/2012   Hypertension 09/09/2012   Other long term (current) drug therapy 06/28/2012   H/O aortic valve  replacement 06/28/2012    Palliative Care Assessment & Plan   Patient Profile:  75 y.o. male  with past medical history of OSA, HTN, COPD, CAD, CABG X2, HLP severe aortic insufficiency s/p mechanical AVR (2008) on Lovenoc, with recurrent stage IIIb lung adenocarcinoma on chemotherapy (Dr. Julien Nordmann). He was admitted on 12/06/2020 with c/o abdominal pain. Patient received chemo 8/25 without negative side effects. On 8/26, patient c/o severe abdominal pain, had poor PO intake, and progressive weakness. Patient arrived by car with wife to ED with c/o 10/10, sharp upper quadrant abdominal pain for which workup with CT of abdomen for suspicion of cholecystitis was negative. However, CT revealed acute right common femoral DVT and interval development of LUQ omental caking, suspicious for metastatic disease.    Palliative care was consulted to discuss [place].   Assessment: Acute abdominal pain much improved, patient underwent CT found to have omental caking left upper quadrant suspicious for progressive metastatic disease. A. fib with mechanical aortic valve, on amiodarone History of recent DVT Ongoing functional decline.  Recommendations/Plan: Goals of care discussions with the patient as well as family are ongoing.  Discussed with patient, discussed with wife both inside and outside the room.  I met with the patient's wife and son.  Introduced myself and palliative care as follows: Palliative medicine is specialized medical care for people living with serious illness. It focuses on providing relief from the symptoms and stress of a serious illness. The goal is to improve quality of life for both the patient and the family. Goals of care: Broad aims of medical therapy in relation to the patient's values and preferences. Our aim is to provide medical care aimed at enabling patients to achieve the goals that matter most to them, given the circumstances of their particular medical situation and their  constraints.  Life review performed.  Patient's medical history reviewed.  Goals wishes and values attempted to be outlined.  Discussed about disease trajectory of illness and expected prognosis.  Outcome of discussions as  listed below:  DNR DNI Continue to treat the treatable PT consult Home with home health, home based PT along with outpatient palliative care. Family to consider hospice afterwards.  Patient is clear about not wanting any more chemotherapy or cancer related treatments.       Code Status:    Code Status Orders  (From admission, onward)           Start     Ordered   12/08/20 1620  Do not attempt resuscitation (DNR)  Continuous       Question Answer Comment  In the event of cardiac or respiratory ARREST Do not call a "code blue"   In the event of cardiac or respiratory ARREST Do not perform Intubation, CPR, defibrillation or ACLS   In the event of cardiac or respiratory ARREST Use medication by any route, position, wound care, and other measures to relive pain and suffering. May use oxygen, suction and manual treatment of airway obstruction as needed for comfort.      12/08/20 1619           Code Status History     Date Active Date Inactive Code Status Order ID Comments User Context   12/06/2020 1855 12/08/2020 1619 Full Code [id number]  Mendel Corning, MD ED   11/23/2020 1931 11/25/2020 1858 Full Code [id number]  Elwyn Reach, MD Inpatient   12/08/2019 1815 12/18/2019 1553 Full Code [id number]  Nani Skillern, PA-C Inpatient      Advance Directive Documentation    Flowsheet Row Most Recent Value  Type of Advance Directive Healthcare Power of Attorney, Living will  Pre-existing out of facility DNR order (yellow form or pink MOST form) --  "MOST" Form in Place? --       Prognosis:  Likely less than 6 months.   Discharge Planning: To Be Determined  Care plan was discussed with  wife son and patient.   Thank you for allowing the Palliative  Medicine Team to assist in the care of this patient.   Time In:  11 Time Out: 11.35 Total Time 35 Prolonged Time Billed  no       Greater than 50%  of this time was spent counseling and coordinating care related to the above assessment and plan.  Loistine Chance, MD  Please contact Palliative Medicine Team phone at [phone] for questions and concerns.

## 2020-12-10 NOTE — Progress Notes (Signed)
[facility]                                                                              Patient Demographics  Marcus [name], is a 75 y.o. male, DOB - 06-06-1945, DGL:[id number]  Admit date - 12/06/2020   Admitting Physician Ripudeep Krystal Eaton, MD  Outpatient Primary MD for the patient is [name], MD  Outpatient specialists:   LOS - 4  days   Medical records reviewed and are as summarized below:    Chief Complaint  Patient presents with   Abdominal Pain   Chest Pain       Brief summary   Patient is a 75 year old male with OSA, HTN, recently diagnosed bilateral DVT, COPD, CAD CABG x2, HTN, HLP severe aortic insufficiency status post mechanical AVR in 2008, on indefinite Lovenox, recurrent stage IIIb lung adeno CA on chemotherapy (followed by Dr. Julien Nordmann) was admitted with abdominal pain.  Patient had chemo on 8/25 (Thursday) without any issues.  However next day he started having abdominal pain, and continued to progressively worsen.  Patient reported pain in the right upper quadrant, sharp, intermittent, 10/10, associated with nausea.  No vomiting.  Had 3 episodes of diarrhea with no hematochezia or melena.  No fevers.  Decreased p.o. intake in the last 3 days.   CT abdomen showed acute right common femoral DVT, mild gallbladder wall thickening, likely reactive however possible acute cholecystitis.  Interval development of left upper quadrant omental caking suspicious for progressive metastatic disease. Ultrasound abdomen showed mild gallbladder sludge.  General surgery was consulted  Assessment & Plan    Acute abdominal pain -Initially, concern about acute cholecystitis.  General surgery was consulted.  -placed on n.p.o. status, IV fluids, IV antibiotics, pain control -HIDA scan negative  - per surgery, no need for cholecystectomy, placed on clears -DC IV Rocephin -CT abdomen also showed interval development of omental caking in LUQ suspicious for  progressive metastatic disease -Treat supportively with pain management   Active Problems:   History of recurrent non-small cell lung cancer, adenocarcinoma stage IIIb -Followed by Dr. Julien Nordmann, on chemotherapy, received last on 8/25  -CT abdomen also showed interval development of omental caking in LUQ suspicious for progressive metastatic disease - oncology consulted, overall prognosis is poor -Patient has expressed a desire to not continue with further chemotherapy  History of recent DVT -Dopplers on 8/16 showed extensive bilateral deep and superficial vein thrombosis, while on Coumadin -Patient was placed on therapeutic Lovenox indefinitely.    Hypertension, hyperlipidemia -Hold statins due to transaminitis -Holding ramipril for borderline blood pressures   Postoperative atrial fibrillation, mechanical AVR -Currently normal sinus rhythm, history of DCCV in 2009 -He has developed rapid atrial fibrillation on 8/31 -With borderline blood pressures, rate control would be challenging -Started on IV amiodarone with improvement of heart rate -Patient was ambulated on 9/2 became significantly tachycardic in atrial fibrillation -Metoprolol dose has been adjusted, continue to monitor   Obstructive sleep apnea -Continue CPAP nightly   COPD --currently stable, no wheezing -Placed on duo nebs as needed  Goals of care -Seen by palliative care to discuss further goals and his  wife feels that patient would want DNR -After further discussions with the patient and family, they have elected to have the patient discharged home with home health and outpatient palliative care services to follow -Physical therapy evaluation has been requested   Code Status: DNR DVT Prophylaxis:    Therapeutic Lovenox   Level of Care: Level of care: Progressive Family Communication: Discussed all imaging results, lab results, explained to the patient    Disposition Plan:     Status is:  Inpatient  Remains inpatient appropriate because:Inpatient level of care appropriate due to severity of illness  Dispo: The patient is from: Home              Anticipated d/c is to: Home              Patient currently is not medically stable to d/c.   Difficult to place patient No      Time Spent in minutes   25 minutes  Procedures:  CT abdomen  Consultants:   General surgery Oncology Palliative care  Antimicrobials:   Anti-infectives (From admission, onward)    Start     Dose/Rate Route Frequency Ordered Stop   12/07/20 1700  cefTRIAXone (ROCEPHIN) 2 g in sodium chloride 0.9 % 100 mL IVPB  Status:  Discontinued       Note to Pharmacy: Pharmacy may adjust dosing strength, schedule, rate of infusion, etc as needed to optimize therapy   2 g 200 mL/hr over 30 Minutes Intravenous Every 24 hours 12/06/20 1836 12/07/20 1313   12/06/20 1900  cefTRIAXone (ROCEPHIN) 2 g in sodium chloride 0.9 % 100 mL IVPB  Status:  Discontinued        2 g 200 mL/hr over 30 Minutes Intravenous Every 24 hours 12/06/20 1855 12/06/20 1919   12/06/20 1715  cefTRIAXone (ROCEPHIN) 2 g in sodium chloride 0.9 % 100 mL IVPB        2 g 200 mL/hr over 30 Minutes Intravenous  Once 12/06/20 1710 12/06/20 1801          Medications  Scheduled Meds:  Chlorhexidine Gluconate Cloth  6 each Topical Daily   enoxaparin (LOVENOX) injection  75 mg Subcutaneous Q12H   fluticasone  2 spray Each Nare Daily   folic acid  1 mg Oral Daily   loratadine  10 mg Oral Daily   magic mouthwash w/lidocaine  15 mL Oral QID   metoprolol tartrate  25 mg Oral BID   mirtazapine  15 mg Oral QHS   polyethylene glycol  17 g Oral Daily   Continuous Infusions:   PRN Meds:.ALPRAZolam, alum & mag hydroxide-simeth, HYDROmorphone (DILAUDID) injection, morphine CONCENTRATE, nitroGLYCERIN, sodium chloride flush      Subjective:   Marcus [name] feels better today.  P.o. intake improving.  Reports having a large bowel movement  yesterday.  Pain medication is helping with abdominal pain.  Objective:   Vitals:   12/10/20 1021 12/10/20 1324 12/10/20 1745 12/10/20 1753  BP: 115/74 109/71  122/71  Pulse: 87 95 (!) 160 93  Resp:  19    Temp:  97.9 F (36.6 C)    TempSrc:  Oral    SpO2:  95%    Weight:      Height:        Intake/Output Summary (Last 24 hours) at 12/10/2020 1904 Last data filed at 12/10/2020 1521 Gross per 24 hour  Intake --  Output 490 ml  Net -490 ml     Wt Readings from Last  3 Encounters:  12/07/20 73 kg  12/03/20 73.6 kg  12/01/20 73.4 kg     Exam General exam: Alert, awake, oriented x 3 Respiratory system: Clear to auscultation. Respiratory effort normal. Cardiovascular system:RRR. No murmurs, rubs, gallops. Gastrointestinal system: Abdomen is nondistended, soft and nontender. No organomegaly or masses felt. Normal bowel sounds heard. Central nervous system: Alert and oriented. No focal neurological deficits. Extremities: No C/C/E, +pedal pulses Skin: No rashes, lesions or ulcers Psychiatry: Judgement and insight appear normal. Mood & affect appropriate.     Data Reviewed:  I have personally reviewed following labs and imaging studies  Micro Results Recent Results (from the past 240 hour(s))  Resp Panel by RT-PCR (Flu A&B, Covid) Nasopharyngeal Swab     Status: None   Collection Time: 12/06/20  5:22 PM   Specimen: Nasopharyngeal Swab; Nasopharyngeal(NP) swabs in vial transport medium  Result Value Ref Range Status   SARS Coronavirus 2 by RT PCR NEGATIVE NEGATIVE Final    Comment: (NOTE) SARS-CoV-2 target nucleic acids are NOT DETECTED.  The SARS-CoV-2 RNA is generally detectable in upper respiratory specimens during the acute phase of infection. The lowest concentration of SARS-CoV-2 viral copies this assay can detect is 138 copies/mL. A negative result does not preclude SARS-Cov-2 infection and should not be used as the sole basis for treatment or other patient  management decisions. A negative result may occur with  improper specimen collection/handling, submission of specimen other than nasopharyngeal swab, presence of viral mutation(s) within the areas targeted by this assay, and inadequate number of viral copies(<138 copies/mL). A negative result must be combined with clinical observations, patient history, and epidemiological information. The expected result is Negative.  Fact Sheet for Patients:  EntrepreneurPulse.com.au  Fact Sheet for Healthcare Providers:  IncredibleEmployment.be  This test is no t yet approved or cleared by the [place] FDA and  has been authorized for detection and/or diagnosis of SARS-CoV-2 by FDA under an Emergency Use Authorization (EUA). This EUA will remain  in effect (meaning this test can be used) for the duration of the COVID-19 declaration under Section 564(b)(1) of the Act, 21 U.S.C.section 360bbb-3(b)(1), unless the authorization is terminated  or revoked sooner.       Influenza A by PCR NEGATIVE NEGATIVE Final   Influenza B by PCR NEGATIVE NEGATIVE Final    Comment: (NOTE) The Xpert Xpress SARS-CoV-2/FLU/RSV plus assay is intended as an aid in the diagnosis of influenza from Nasopharyngeal swab specimens and should not be used as a sole basis for treatment. Nasal washings and aspirates are unacceptable for Xpert Xpress SARS-CoV-2/FLU/RSV testing.  Fact Sheet for Patients: EntrepreneurPulse.com.au  Fact Sheet for Healthcare Providers: IncredibleEmployment.be  This test is not yet approved or cleared by the [place] FDA and has been authorized for detection and/or diagnosis of SARS-CoV-2 by FDA under an Emergency Use Authorization (EUA). This EUA will remain in effect (meaning this test can be used) for the duration of the COVID-19 declaration under Section 564(b)(1) of the Act, 21 U.S.C. section 360bbb-3(b)(1),  unless the authorization is terminated or revoked.  Performed at [facility], [place] [address] [place], [place] [address]   Culture, blood (routine x 2)     Status: None (Preliminary result)   Collection Time: 12/06/20  7:45 PM   Specimen: BLOOD  Result Value Ref Range Status   Specimen Description   Final    BLOOD RIGHT WRIST Performed at [facility] [address] [place], [place] [address]  Special Requests   Final    BOTTLES DRAWN AEROBIC AND ANAEROBIC Blood Culture results may not be optimal due to an inadequate volume of blood received in culture bottles Performed at [facility] [address], [place], [place] [address]    Culture   Final    NO GROWTH 3 DAYS Performed at [facility], [place] [address] [place], [place] [address]    Report Status PENDING  Incomplete  Culture, blood (routine x 2)     Status: None (Preliminary result)   Collection Time: 12/06/20  8:00 PM   Specimen: BLOOD  Result Value Ref Range Status   Specimen Description   Final    BLOOD PORTA CATH Performed at [facility] [address] [place], [place] [address]    Special Requests   Final    BOTTLES DRAWN AEROBIC AND ANAEROBIC Blood Culture adequate volume Performed at [facility] [address], [place], [place] [address]    Culture   Final    NO GROWTH 3 DAYS Performed at [facility], [place] [address], [place], [place] [address]    Report Status PENDING  Incomplete    Radiology Reports DG Chest 2 View  Result Date: 12/06/2020 CLINICAL DATA:  Chest pain. EXAM: CHEST - 2 VIEW COMPARISON:  November 23, 2020. FINDINGS: The heart size and mediastinal contours are within normal limits. Status post cardiac valve repair. Right internal jugular Port-A-Cath is unchanged in position. No pneumothorax is noted. Left lung is clear. Stable probable scarring is noted in the right lung base. The  visualized skeletal structures are unremarkable. IMPRESSION: Stable right lung scarring.  No acute abnormality seen. Electronically Signed   By: Marijo Conception M.D.   On: 12/06/2020 11:38   CT Angio Chest PE W/Cm &/Or Wo Cm  Result Date: 12/06/2020 CLINICAL DATA:  Concern for pulmonary embolism. History of lung cancer with chemotherapy ongoing. EXAM: CT ANGIOGRAPHY CHEST WITH CONTRAST TECHNIQUE: Multidetector CT imaging of the chest was performed using the standard protocol during bolus administration of intravenous contrast. Multiplanar CT image reconstructions and MIPs were obtained to evaluate the vascular anatomy. CONTRAST:  155mL OMNIPAQUE IOHEXOL 350 MG/ML SOLN COMPARISON:  CT chest 11/01/2020 FINDINGS: Cardiovascular: No filling defects within the pulmonary arteries to suggest acute pulmonary embolism. Post CABG anatomy. Mediastinum/Nodes: Port in the anterior chest wall with tip in distal SVC. No axillary or supraclavicular adenopathy. No mediastinal or hilar adenopathy. No pericardial fluid. Esophagus normal. Lungs/Pleura: Volume loss in the RIGHT hemithorax. Suture line in the upper lobe. No suspicious nodularity. Thickening along the fissure is similar prior. LEFT lung is mildly hyperinflated. No acute findings. Upper Abdomen: Limited view of the liver, kidneys, pancreas are unremarkable. Normal adrenal glands. Musculoskeletal: No aggressive osseous lesion. Review of the MIP images confirms the above findings. IMPRESSION: 1. No evidence acute pulmonary embolism. 2. No pulmonary infarction or pneumonia. 3. Postsurgical change in the RIGHT hemithorax without evidence local lung cancer recurrence. Electronically Signed   By: Suzy Bouchard M.D.   On: 12/06/2020 15:09   NM Hepatobiliary Liver Func  Result Date: 12/07/2020 CLINICAL DATA:  Biliary colic, recurrent, gallbladder dyskinesia suspected EXAM: NUCLEAR MEDICINE HEPATOBILIARY IMAGING TECHNIQUE: Sequential images of the abdomen were obtained  out to 60 minutes following intravenous administration of radiopharmaceutical. RADIOPHARMACEUTICALS:  5.5 mCi Tc-51m  Choletec IV COMPARISON:  12/06/2020 FINDINGS: Prompt uptake and biliary excretion of activity by the liver is seen. Gallbladder activity is visualized, consistent  with patency of cystic duct. Biliary activity passes into small bowel, consistent with patent common bile duct. IMPRESSION: Normal exam.  Patency of the cystic duct and common bile duct. Electronically Signed   By: Davina Poke D.O.   On: 12/07/2020 10:01   US Abdomen Limited  Result Date: 12/06/2020 CLINICAL DATA:  Right upper quadrant pain EXAM: ULTRASOUND ABDOMEN LIMITED RIGHT UPPER QUADRANT COMPARISON:  12/06/2020 FINDINGS: Gallbladder: Gallbladder is well distended with evidence of gallbladder sludge. No wall thickening or pericholecystic fluid is noted. Common bile duct: Diameter: Prominent up to 1.2 cm with distal normal tapering. No intrahepatic ductal dilatation is noted. Liver: No focal lesion identified. Within normal limits in parenchymal echogenicity. Portal vein is patent on color Doppler imaging with normal direction of blood flow towards the liver. Other: None. IMPRESSION: Mild gallbladder sludge. Mildly prominent common bile duct centrally with normal distal tapering similar to that seen on prior CT. No intrahepatic ductal dilatation is noted. Electronically Signed   By: Inez Catalina M.D.   On: 12/06/2020 16:52   DG Chest Port 1 View  Result Date: 11/23/2020 CLINICAL DATA:  History of lung cancer. EXAM: PORTABLE CHEST 1 VIEW COMPARISON:  Chest x-ray 09/07/2020 FINDINGS: The cardiac silhouette, mediastinal and hilar contours are within normal limits and stable. Stable surgical changes from cardiac surgery. Right IJ power port in good position without complicating features. Chronic pulmonary scarring changes.  Chronic right-sided pleural-. IMPRESSION: Chronic lung changes but no acute pulmonary findings.  Electronically Signed   By: Marijo Sanes M.D.   On: 11/23/2020 17:24   VAS [place] LOWER EXTREMITY VENOUS (DVT)  Result Date: 11/23/2020  Lower Venous DVT Study Patient Name:  Marcus [name]  Date of Exam:   11/23/2020 Medical Rec #: [id number]      Accession #:    [id number] Date of Birth: 08/29/1945       Patient Gender: M Patient Age:   56 years Exam Location:  [facility] Procedure:      VAS [place] LOWER EXTREMITY VENOUS (DVT) Referring Phys: [facility] [name] --------------------------------------------------------------------------------  Indications: Patient presents today with complaints of acute onset of right lower extremity swelling x 3 days, and right groin pain x 1 day. He denies any symptoms involving the left lower extremity. He denies any SOB.  Risk Factors: Cancer of the lung and currently doing chemotherapy since August 2021. Anticoagulation: Coumadin.  Performing Technologist: Sharlett Iles RVT  Examination Guidelines: A complete evaluation includes B-mode imaging, spectral Doppler, color Doppler, and power Doppler as needed of all accessible portions of each vessel. Bilateral testing is considered an integral part of a complete examination. Limited examinations for reoccurring indications may be performed as noted. The reflux portion of the exam is performed with the patient in reverse Trendelenburg.  +---------+---------------+---------+-----------+---------------+--------------+ RIGHT    CompressibilityPhasicitySpontaneityProperties     Thrombus Aging +---------+---------------+---------+-----------+---------------+--------------+ CFV      None           No       No         softly         Age                                                        echogenic and  Indeterminate  brightly                                                                  echogenic                      +---------+---------------+---------+-----------+---------------+--------------+ SFJ      None           No       No         softly         Acute                                                      echogenic                     +---------+---------------+---------+-----------+---------------+--------------+ FV Prox  None           No       No         softly         Age                                                        echogenic and  Indeterminate                                              brightly                      +---------+---------------+---------+-----------+---------------+--------------+ FV Mid   None           No       No         softly         Acute                                                      echogenic                     +---------+---------------+---------+-----------+---------------+--------------+ FV DistalNone           No       No         softly         Acute                                                      echogenic                     +---------+---------------+---------+-----------+---------------+--------------+  PFV      None           No       No         softly         Age                                                        echogenic and  Indeterminate                                              brightly                                                                  echogenic                     +---------+---------------+---------+-----------+---------------+--------------+ POP      None           No       No         softly         Acute                                                      echogenic                     +---------+---------------+---------+-----------+---------------+--------------+ PTV      None           No       No         softly         Acute                                                      echogenic                      +---------+---------------+---------+-----------+---------------+--------------+ PERO     None           No       No         softly         Acute                                                      echogenic                     +---------+---------------+---------+-----------+---------------+--------------+ Soleal   None  No       No         softly         Acute                                                      echogenic                     +---------+---------------+---------+-----------+---------------+--------------+ Gastroc  None           No       No         softly         Acute                                                      echogenic                     +---------+---------------+---------+-----------+---------------+--------------+ GSV      None           No       No         softly         Acute                                                      echogenic                     +---------+---------------+---------+-----------+---------------+--------------+ SSV      None           No       No         softly         Age                                                        echogenic      Indeterminate  +---------+---------------+---------+-----------+---------------+--------------+ SPJ      None           No       No         softly         Acute                                                      echogenic                     +---------+---------------+---------+-----------+---------------+--------------+ TPT      None           No       No         softly  Acute                                                      echogenic                     +---------+---------------+---------+-----------+---------------+--------------+   Right Technical Findings: Acute non-occlusive thrombus in the distal external iliac vein. Acute occlusive thrombus noted in the SFJ, GSV, proximal to distal FV,  popliteal vein, gastrocnemius vein, TPT, PTV, popliteal vein and SPJ. Age Indeterminate occlusive thrombus noted in the  CFV, ostial FV, DFV and proximal SSV.  +---------+---------------+---------+-----------+---------------+--------------+ LEFT     CompressibilityPhasicitySpontaneityProperties     Thrombus Aging +---------+---------------+---------+-----------+---------------+--------------+ CFV      Full           Yes      Yes                                      +---------+---------------+---------+-----------+---------------+--------------+ SFJ      Full           Yes      Yes                                      +---------+---------------+---------+-----------+---------------+--------------+ FV Prox  Full           Yes      Yes                                      +---------+---------------+---------+-----------+---------------+--------------+ FV Mid   Full           Yes      Yes                                      +---------+---------------+---------+-----------+---------------+--------------+ FV DistalFull           Yes      Yes                                      +---------+---------------+---------+-----------+---------------+--------------+ PFV      None           No       No         softly         Acute                                                      echogenic                     +---------+---------------+---------+-----------+---------------+--------------+ POP      Full           Yes      Yes                                      +---------+---------------+---------+-----------+---------------+--------------+  PTV      None           No       No         softly         Acute                                                      echogenic                     +---------+---------------+---------+-----------+---------------+--------------+ PERO     None           No       No         softly         Acute                                                       echogenic                     +---------+---------------+---------+-----------+---------------+--------------+ Soleal   None           No       No         softly         Acute                                                      echogenic                     +---------+---------------+---------+-----------+---------------+--------------+ Gastroc  Full           Yes      Yes                                      +---------+---------------+---------+-----------+---------------+--------------+ GSV      None           No       No         softly         Acute                                                      echogenic                     +---------+---------------+---------+-----------+---------------+--------------+ SSV      None           No       No         softly         Acute  echogenic                     +---------+---------------+---------+-----------+---------------+--------------+ TPT      None           No       No         softly         Acute                                                      echogenic                     +---------+---------------+---------+-----------+---------------+--------------+  Left Technical Findings: Acute occlusive thrombus in the proximal GSV, not invading the SFJ (.37 cm from junction). Acute occlusive thrombus in the DFV, TPT, PTV, peroneal veins, SSV and soleal vein.  Findings reported to Dr. Gardiner Rhyme at 2:00 pm.  Summary: RIGHT: - Findings consistent with acute deep vein thrombosis involving the SF junction, right femoral vein, right popliteal vein, right posterior tibial veins, right peroneal veins, right soleal veins, right gastrocnemius veins, and TPT and SPJ. - Findings consistent with acute superficial vein thrombosis involving the right great saphenous vein. - Findings consistent with age indeterminate deep vein  thrombosis involving the right common femoral vein, right femoral vein, and right proximal profunda vein. - Findings consistent with age indeterminate superficial vein thrombosis involving the right small saphenous vein. - No cystic structure found in the popliteal fossa. - Findings consistent with acute deep vein thrombosis involving the distal external iliac vein.  LEFT: - Findings consistent with acute deep vein thrombosis involving the left proximal profunda vein, left posterior tibial veins, left peroneal veins, left soleal veins, and TPT. - Findings consistent with acute superficial vein thrombosis involving the left great saphenous vein, and left small saphenous vein. - No cystic structure found in the popliteal fossa.  Incidental findings: Abnormal dilatation of the proximal abdominal aorta, measuring 3.2 cm AP x 3.2 cm TRV. Suggest follow-up for AAA duplex. *See table(s) above for measurements and observations. Electronically signed by Ida Rogue MD on 11/23/2020 at 6:44:57 PM.    Final    CT Angio Abd/Pel W and/or Wo Contrast  Result Date: 12/06/2020 CLINICAL DATA:  Abdominal pain and shortness of breath History of abdominal aortic aneurysm EXAM: CTA ABDOMEN AND PELVIS WITHOUT AND WITH CONTRAST TECHNIQUE: Multidetector CT imaging of the abdomen and pelvis was performed using the standard protocol during bolus administration of intravenous contrast. Multiplanar reconstructed images and MIPs were obtained and reviewed to evaluate the vascular anatomy. CONTRAST:  190mL OMNIPAQUE IOHEXOL 350 MG/ML SOLN COMPARISON:  CT abdomen pelvis 08/01/2020 FINDINGS: VASCULAR Aorta: Diffuse atherosclerotic plaque of the abdominal aorta with aneurysm measuring up to 3.2 x 2.5 cm which is similar in size compared to prior examination where it measured 3.0 x 2.7 cm when measured in a similar manner. Celiac: Mild narrowing of the origin of the celiac artery. Evaluation of the branches of the celiac artery limited due to  suboptimal timing of contrast bolus. SMA: No significant stenosis. Renals: Mild narrowing at the origin of the renal arteries. IMA: Patent. Inflow: The right common iliac artery is mildly prominent measuring up to 1.7 cm compared to 1.6 cm on the prior study. There is likely at least moderate stenosis at the origin of  the right internal iliac artery. No significant stenosis of the right external iliac or common femoral arteries. The left common iliac artery is markedly tortuous with maximum diameter measuring 2.3 cm the overall diameter is not significantly changed since the prior examination. This is likely the site of focal dissection with partially thrombosed false lumen given appearance on prior CT. 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 No significant stenosis of the left external iliac, internal iliac or common femoral arteries. Proximal Outflow: No significant narrowing of visualized right proximal of flow. There is moderate to severe stenosis of the origin of the left superficial femoral artery. Veins: Acute thrombus noted within the right common femoral vein. Review of the MIP images confirms the above findings. NON-VASCULAR Lower chest: No acute abnormality. Hepatobiliary: No focal hepatic lesion. Mild thickening of the wall the gallbladder. Minimal perihepatic ascites. No bile duct dilatation. Portal vein is patent. Pancreas: Unremarkable. No pancreatic ductal dilatation or surrounding inflammatory changes. Spleen: Normal in size without focal abnormality. Adrenals/Urinary Tract: Adrenal glands are normal. Bilateral simple renal cysts are again seen. No significant abnormality of the bladder or ureters. Stomach/Bowel: Distal colonic diverticulosis without evidence of acute diverticulitis. Distal colonic diverticulosis without evidence of acute diverticulitis. There has been interval  development of omental caking with greatest concentration along the splenic flexure of the colon best seen on images 36-81 of series 4. Lymphatic: No enlarged abdominal or pelvic lymph nodes. Reproductive: Prostate is unremarkable. Other: Small amount of free fluid seen at the pelvic floor. Musculoskeletal: No acute or significant osseous findings. IMPRESSION: VASCULAR 1. Acute right common femoral deep venous thrombosis. 2. Unchanged abdominal aortic aneurysm measuring up to 3.2 x 2.5 cm. Recommend follow-up every 3 years. This recommendation follows ACR consensus guidelines: White Paper of the ACR Incidental Findings Committee II on Vascular Findings. J Am Coll Radiol 2013; 10:789-794. 3. 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 4. 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 5. Please note evaluation arterial vasculature is limited due to suboptimal contrast bolus timing. NON-VASCULAR 1. Mild gallbladder wall thickening is likely reactive. If the patient has symptoms of acute cholecystitis, further evaluation with right upper quadrant ultrasound should be performed. 2. Distal colonic diverticulosis without evidence of acute diverticulitis. 3. Interval development of left upper quadrant omental caking suspicious for progressive metastatic disease. These results were called by telephone at the time of interpretation on 12/06/2020 at 3:55 pm to provider Lacretia Leigh , who verbally acknowledged these results. Electronically Signed   By: Miachel Roux M.D.   On: 12/06/2020 15:57    Lab Data:  CBC: Recent Labs  Lab 12/06/20 1214 12/07/20 0359 12/08/20 0320 12/09/20 0335 12/10/20 0430  WBC 14.5* 9.5 4.6 1.9* 2.8*  HGB 8.4* 7.7* 8.1* 8.4* 8.7*  HCT 25.6* 23.9* 25.2* 25.0* 26.5*  MCV 92.4 94.1 91.6 90.9 91.1  PLT 203 156 130* 121* 503*   Basic Metabolic Panel: Recent Labs  Lab 12/06/20 1214 12/07/20 0359  12/09/20 0335  NA 132* 137 138  K 3.9 4.1 3.6  CL 98 105 106  CO2 24 25 25   GLUCOSE 95 96 121*  BUN 25* 19 18  CREATININE 0.93 0.79 0.73  CALCIUM 8.2* 8.2* 7.8*   GFR: Estimated Creatinine Clearance: 82.4 mL/min (by C-G formula based on SCr of 0.73  mg/dL). Liver Function Tests: Recent Labs  Lab 12/06/20 1214 12/07/20 0359 12/09/20 0335  AST 139* 86* 53*  ALT 226* 160* 89*  ALKPHOS 153* 115 152*  BILITOT 1.3* 0.9 0.7  PROT 6.6 6.0* 5.4*  ALBUMIN 2.6* 2.3* 1.9*   No results for input(s): LIPASE, AMYLASE in the last 168 hours. No results for input(s): AMMONIA in the last 168 hours. Coagulation Profile: Recent Labs  Lab 12/06/20 1214  INR 1.1   Cardiac Enzymes: No results for input(s): CKTOTAL, CKMB, CKMBINDEX, TROPONINI in the last 168 hours. BNP (last 3 results) No results for input(s): PROBNP in the last 8760 hours. HbA1C: No results for input(s): HGBA1C in the last 72 hours. CBG: No results for input(s): GLUCAP in the last 168 hours. Lipid Profile: No results for input(s): CHOL, HDL, LDLCALC, TRIG, CHOLHDL, LDLDIRECT in the last 72 hours. Thyroid Function Tests: No results for input(s): TSH, T4TOTAL, FREET4, T3FREE, THYROIDAB in the last 72 hours. Anemia Panel: No results for input(s): VITAMINB12, FOLATE, FERRITIN, TIBC, IRON, RETICCTPCT in the last 72 hours. Urine analysis:    Component Value Date/Time   COLORURINE YELLOW 12/06/2020 1644   APPEARANCEUR HAZY (A) 12/06/2020 1644   LABSPEC 1.043 (H) 12/06/2020 1644   PHURINE 6.0 12/06/2020 1644   GLUCOSEU NEGATIVE 12/06/2020 1644   GLUCOSEU NEGATIVE 08/20/2020 0957   HGBUR NEGATIVE 12/06/2020 [place] 12/06/2020 1644   BILIRUBINUR negative 08/13/2020 0947   KETONESUR 5 (A) 12/06/2020 1644   PROTEINUR NEGATIVE 12/06/2020 1644   UROBILINOGEN 0.2 08/20/2020 0957   NITRITE NEGATIVE 12/06/2020 [place] 12/06/2020 1644     Kathie Dike M.D. [facility] 12/10/2020, 7:04 PM  Available via Epic secure chat 7am-7pm After 7 pm, please refer to night coverage provider listed on amion.

## 2020-12-11 LAB — BASIC METABOLIC PANEL
Anion gap: 7 (ref 5–15)
BUN: 18 mg/dL (ref 8–23)
CO2: 25 mmol/L (ref 22–32)
Calcium: 7.4 mg/dL — ABNORMAL LOW (ref 8.9–10.3)
Chloride: 102 mmol/L (ref 98–111)
Creatinine, Ser: 0.72 mg/dL (ref 0.61–1.24)
GFR, Estimated: >60 mL/min (ref >60)
Glucose, Bld: 103 mg/dL — ABNORMAL HIGH (ref 70–99)
Potassium: 3.5 mmol/L (ref 3.5–5.1)
Sodium: 134 mmol/L — ABNORMAL LOW (ref 135–145)

## 2020-12-11 LAB — CBC
HCT: 25 % — ABNORMAL LOW (ref 39.0–52.0)
Hemoglobin: 8.3 g/dL — ABNORMAL LOW (ref 13.0–17.0)
MCH: 30.2 pg (ref 26.0–34.0)
MCHC: 33.2 g/dL (ref 30.0–36.0)
MCV: 90.9 fL (ref 80.0–100.0)
Platelets: 91 10*3/uL — ABNORMAL LOW (ref 150–400)
RBC: 2.75 MIL/uL — ABNORMAL LOW (ref 4.22–5.81)
RDW: 19.9 % — ABNORMAL HIGH (ref 11.5–15.5)
WBC: 3.2 10*3/uL — ABNORMAL LOW (ref 4.0–10.5)
nRBC: 0 % (ref 0.0–0.2)

## 2020-12-11 MED ORDER — POLYETHYLENE GLYCOL 3350 17 G PO PACK: 17.0000 g | PACK | Freq: Every day | ORAL | Status: DC | PRN

## 2020-12-11 MED ORDER — LOPERAMIDE HCL 2 MG PO CAPS
2.0000 mg | ORAL_CAPSULE | Freq: Once | ORAL | Status: AC
  Administered 2020-12-11: 2 mg via ORAL
  Filled 2020-12-11: qty 1

## 2020-12-11 NOTE — Evaluation (Signed)
Physical Therapy Evaluation Patient Details Name: Marcus [name] MRN: [id number] DOB: 05-22-1945 Today's Date: 12/11/2020   History of Present Illness  Pt admitted from home 2* severe abdominal pain possibly 2* acute cholecystitis.  Pt with hx of a-fib, COPD, CAD, CABG, severe aortic insufficiency s/p mechanical AVR, Lung CA and adeno Carcinoma (currently on chemo) as well as recent DVT.  Clinical Impression  Pt admitted as above and presenting with functional mobility limitations 2* mild generalized weakness, ambulatory balance deficits and questionable safety awareness.  Pt very pleasant and motivated to return home with assist of family.    Follow Up Recommendations Home health PT (dependent on acute stay progress)    Equipment Recommendations  None recommended by PT    Recommendations for Other Services       Precautions / Restrictions Precautions Precautions: Fall Restrictions Weight Bearing Restrictions: No      Mobility  Bed Mobility Overal bed mobility: Modified Independent             General bed mobility comments: Increased time but no physical assist    Transfers Overall transfer level: Needs assistance Equipment used: None Transfers: Sit to/from Stand Sit to Stand: Min guard         General transfer comment: steady assist for initial mild retropulsion  Ambulation/Gait Ambulation/Gait assistance: Min assist;Min guard Gait Distance (Feet): 470 Feet Assistive device: None;Rolling walker (2 wheeled) Gait Pattern/deviations: Step-through pattern;Shuffle;Trunk flexed;Wide base of support;Staggering right;Staggering left;Scissoring     General Gait Details: Pt ambulated ~70 feet sans AD but with noted staggering left/right and scissoring gait requiring assist to prevent falls.  Pt ambulated additional 300' with RW, min guard and cues for posture and position from RW.  Pt ambulated additional 100' sans AD with noted improvement instability.  Spouse reports  that pt has really not been up to walk for six days  Stairs            Wheelchair Mobility    Modified Rankin (Stroke Patients Only)       Balance Overall balance assessment: Needs assistance Sitting-balance support: No upper extremity supported;Feet supported Sitting balance-Leahy Scale: Good     Standing balance support: No upper extremity supported Standing balance-Leahy Scale: Fair (but progressing toward good with increased time on feet)                               Pertinent Vitals/Pain Pain Assessment: No/denies pain    Home Living Family/patient expects to be discharged to:: Private residence Living Arrangements: Spouse/significant other Available Help at Discharge: Available 24 hours/day;Family Type of Home: House Home Access: Stairs to enter   [employer] of Steps: 1 Home Layout: One level Home Equipment:  (has access to RW)      Prior Function Level of Independence: Independent               Hand Dominance        Extremity/Trunk Assessment   Upper Extremity Assessment Upper Extremity Assessment: Overall WFL for tasks assessed (~4/5)    Lower Extremity Assessment Lower Extremity Assessment: Overall WFL for tasks assessed (~4/5)    Cervical / Trunk Assessment Cervical / Trunk Assessment: Normal  Communication   Communication: No difficulties  Cognition Arousal/Alertness: Awake/alert Behavior During Therapy: Flat affect  General Comments: Noted slow processing; and repetition of instruction requiring intermittently for pt to grasp      General Comments      Exercises     Assessment/Plan    PT Assessment Patient needs continued PT services  PT Problem List Decreased strength;Decreased activity tolerance;Decreased balance;Decreased mobility;Decreased knowledge of use of DME;Decreased cognition       PT Treatment Interventions DME instruction;Gait  training;Stair training;Functional mobility training;Therapeutic activities;Therapeutic exercise;Balance training;Patient/family education    PT Goals (Current goals can be found in the Care Plan section)  Acute Rehab PT Goals Patient Stated Goal: HOME PT Goal Formulation: With patient/family Time For Goal Achievement: 12/25/20 Potential to Achieve Goals: Good    Frequency Min 3X/week   Barriers to discharge        Co-evaluation               AM-PAC PT "6 Clicks" Mobility  Outcome Measure Help needed turning from your back to your side while in a flat bed without using bedrails?: None Help needed moving from lying on your back to sitting on the side of a flat bed without using bedrails?: None Help needed moving to and from a bed to a chair (including a wheelchair)?: A Little Help needed standing up from a chair using your arms (e.g., wheelchair or bedside chair)?: A Little Help needed to walk in hospital room?: A Little Help needed climbing 3-5 steps with a railing? : A Little 6 Click Score: 20    End of Session Equipment Utilized During Treatment: Gait belt Activity Tolerance: Patient tolerated treatment well Patient left: in chair;with call bell/phone within reach;with chair alarm set;with family/visitor present Nurse Communication: Mobility status PT Visit Diagnosis: Unsteadiness on feet (R26.81);Muscle weakness (generalized) (M62.81);Difficulty in walking, not elsewhere classified (R26.2)    Time: 8638-1771 PT Time Calculation (min) (ACUTE ONLY): 22 min   Charges:   PT Evaluation $PT Eval Low Complexity: 1 Low          Debe Coder PT Acute Rehabilitation Services Pager [phone] Office [phone]   Aleighya Mcanelly 12/11/2020, 1:58 PM

## 2020-12-11 NOTE — Progress Notes (Signed)
Daily Progress Note   Patient Name: Marcus [name]       Date: 12/11/2020 DOB: 05-21-1945  Age: 75 y.o. MRN#: [id number] Attending Physician: Kathie Dike, MD Primary Care Physician: [name], MD Admit Date: 12/06/2020  Reason for Consultation/Follow-up: Establishing goals of care  Subjective:  Awake alert resting in bed, no distress.   wife at bedside Patient ambulating more, feels better. Denies complaints.  Wants regular diet.   Length of Stay: 5  Current Medications: Scheduled Meds:   Chlorhexidine Gluconate Cloth  6 each Topical Daily   enoxaparin (LOVENOX) injection  75 mg Subcutaneous Q12H   fluticasone  2 spray Each Nare Daily   folic acid  1 mg Oral Daily   loratadine  10 mg Oral Daily   magic mouthwash w/lidocaine  15 mL Oral QID   metoprolol tartrate  25 mg Oral BID   mirtazapine  15 mg Oral QHS   polyethylene glycol  17 g Oral Daily    Continuous Infusions:    PRN Meds: ALPRAZolam, alum & mag hydroxide-simeth, HYDROmorphone (DILAUDID) injection, morphine CONCENTRATE, nitroGLYCERIN, sodium chloride flush  Physical Exam         Awake alert no distress Irregular  Non labored breathing No edema  Vital Signs: BP 111/74   Pulse (!) 101   Temp 98.2 F (36.8 C) (Oral)   Resp (!) 22   Ht 5\' 10"  (1.778 m)   Wt 73 kg   SpO2 92%   BMI 23.10 kg/m  SpO2: SpO2: 92 % O2 Device: O2 Device: Room Air O2 Flow Rate:    Intake/output summary:  Intake/Output Summary (Last 24 hours) at 12/11/2020 1213 Last data filed at 12/11/2020 0400 Gross per 24 hour  Intake --  Output 750 ml  Net -750 ml    LBM: Last BM Date: 12/09/20 Baseline Weight: Weight: 73.6 kg Most recent weight: Weight: 73 kg       Palliative Assessment/Data:      Patient Active  Problem List   Diagnosis Date Noted   Acute abdominal pain 12/06/2020   Failure to thrive in adult 12/06/2020   Inguinal hernia, left - reducible 12/06/2020   Chronic anticoagulation on SQ Lovenox (had DVT on Coumadin) 12/06/2020   Recurrent deep vein thrombosis (DVT) ([place]) 12/06/2020   Omental caking LUQ suspicious for  metastatic cancer 12/06/2020   DVT (deep venous thrombosis) ([place]) 11/23/2020   Port-A-Cath in place 10/14/2020   Foot pain, bilateral 10/14/2020   Encounter for antineoplastic immunotherapy 09/23/2020   Antineoplastic chemotherapy induced anemia 09/23/2020   Recurrent non-small cell lung cancer ([place]) 08/25/2020   Goals of care, counseling/discussion 08/25/2020   Anemia due to blood loss 08/13/2020   Coagulopathy ([place]) 08/13/2020   Diarrhea 08/13/2020   Diverticulosis of colon 08/13/2020   Flatulence, eructation and gas pain 08/13/2020   Abdominal pain, diffuse 08/13/2020   Personal history of colonic polyps 08/13/2020   Pleuritic pain 08/10/2020   Decreased appetite 08/10/2020   Adenocarcinoma of lung, stage 3 ([place]) 01/28/2020   Physical exam 01/28/2020   Adenocarcinoma of right lung, stage 3 ([place]) 01/01/2020   Encounter for antineoplastic chemotherapy 01/01/2020   Screening for malignant neoplasm of prostate 12/31/2019   Hypocalcemia 12/31/2019   Hypomagnesemia 12/31/2019   Male hypogonadism 12/31/2019   Presence of prosthetic heart valve 12/31/2019   Thrombocytopenia ([place]) 12/31/2019   Thyromegaly 12/31/2019   Vitamin D deficiency 12/31/2019   Abdominal hernia 06/16/2019   COPD (chronic obstructive pulmonary disease) ([place]) 06/16/2019   Postoperative atrial fibrillation ([place]) 12/22/2012   Sleep apnea 11/01/2012   Bradycardia, severe sinus 09/09/2012   CAD (coronary artery disease) 09/09/2012   S/P thoracic aortic aneurysm repair 09/09/2012   Hx of CABG 09/09/2012   Hypercholesterolemia 09/09/2012   Hypertension 09/09/2012   Other long term (current) drug  therapy 06/28/2012   H/O aortic valve replacement 06/28/2012    Palliative Care Assessment & Plan   Patient Profile:  75 y.o. male  with past medical history of OSA, HTN, COPD, CAD, CABG X2, HLP severe aortic insufficiency s/p mechanical AVR (2008) on Lovenoc, with recurrent stage IIIb lung adenocarcinoma on chemotherapy (Dr. Julien Nordmann). He was admitted on 12/06/2020 with c/o abdominal pain. Patient received chemo 8/25 without negative side effects. On 8/26, patient c/o severe abdominal pain, had poor PO intake, and progressive weakness. Patient arrived by car with wife to ED with c/o 10/10, sharp upper quadrant abdominal pain for which workup with CT of abdomen for suspicion of cholecystitis was negative. However, CT revealed acute right common femoral DVT and interval development of LUQ omental caking, suspicious for metastatic disease.    Palliative care was consulted to discuss [place].   Assessment: Acute abdominal pain much improved, patient underwent CT found to have omental caking left upper quadrant suspicious for progressive metastatic disease. A. fib with mechanical aortic valve, on amiodarone History of recent DVT Ongoing functional decline.  Recommendations/Plan:   DNR DNI Continue to treat the treatable PT consult Home with home health, home based PT along with outpatient palliative care. Family to consider hospice afterwards.  Patient is clear about not wanting any more chemotherapy or cancer related treatments.  Agree with Xanax PRN as well as PO Morphine for symptom management.      Code Status:    Code Status Orders  (From admission, onward)           Start     Ordered   12/08/20 1620  Do not attempt resuscitation (DNR)  Continuous       Question Answer Comment  In the event of cardiac or respiratory ARREST Do not call a "code blue"   In the event of cardiac or respiratory ARREST Do not perform Intubation, CPR, defibrillation or ACLS   In the event of cardiac or  respiratory ARREST Use medication by any route, position, wound  care, and other measures to relive pain and suffering. May use oxygen, suction and manual treatment of airway obstruction as needed for comfort.      12/08/20 1619           Code Status History     Date Active Date Inactive Code Status Order ID Comments User Context   12/06/2020 1855 12/08/2020 1619 Full Code [id number]  Mendel Corning, MD ED   11/23/2020 1931 11/25/2020 1858 Full Code [id number]  Elwyn Reach, MD Inpatient   12/08/2019 1815 12/18/2019 1553 Full Code [id number]  Nani Skillern, PA-C Inpatient      Advance Directive Documentation    Flowsheet Row Most Recent Value  Type of Advance Directive Healthcare Power of Attorney, Living will  Pre-existing out of facility DNR order (yellow form or pink MOST form) --  "MOST" Form in Place? --       Prognosis:  Likely less than 6 months.   Discharge Planning: Home with home health care PT and outpatient palliative.   Care plan was discussed with  wife  and patient.   Thank you for allowing the Palliative Medicine Team to assist in the care of this patient.   Time In:  11 Time Out: 11.25 Total Time 25 Prolonged Time Billed  no       Greater than 50%  of this time was spent counseling and coordinating care related to the above assessment and plan.  Loistine Chance, MD  Please contact Palliative Medicine Team phone at [phone] for questions and concerns.

## 2020-12-11 NOTE — Progress Notes (Signed)
[facility]                                                                              Patient Demographics  Marcus [name], is a 75 y.o. male, DOB - [birth year], GEX:[id number]  Admit date - 12/06/2020   Admitting Physician Ripudeep Krystal Eaton, MD  Outpatient Primary MD for the patient is [name], MD  Outpatient specialists:   LOS - 5  days   Medical records reviewed and are as summarized below:    Chief Complaint  Patient presents with   Abdominal Pain   Chest Pain       Brief summary   Patient is a 75 year old male with OSA, HTN, recently diagnosed bilateral DVT, COPD, CAD CABG x2, HTN, HLP severe aortic insufficiency status post mechanical AVR in 2008, on indefinite Lovenox, recurrent stage IIIb lung adeno CA on chemotherapy (followed by Dr. Julien Nordmann) was admitted with abdominal pain.  Patient had chemo on 8/25 (Thursday) without any issues.  However next day he started having abdominal pain, and continued to progressively worsen.  Patient reported pain in the right upper quadrant, sharp, intermittent, 10/10, associated with nausea.  No vomiting.  Had 3 episodes of diarrhea with no hematochezia or melena.  No fevers.  Decreased p.o. intake in the last 3 days.   CT abdomen showed acute right common femoral DVT, mild gallbladder wall thickening, likely reactive however possible acute cholecystitis.  Interval development of left upper quadrant omental caking suspicious for progressive metastatic disease. Ultrasound abdomen showed mild gallbladder sludge.  General surgery was consulted  Assessment & Plan    Acute abdominal pain -Initially, concern about acute cholecystitis.  General surgery was consulted.  -placed on n.p.o. status, IV fluids, IV antibiotics, pain control -HIDA scan negative  - per surgery, no need for cholecystectomy, placed on clears -DC IV Rocephin -CT abdomen also showed interval development of omental caking in LUQ suspicious for  progressive metastatic disease -Treat supportively with pain management   Active Problems:   History of recurrent non-small cell lung cancer, adenocarcinoma stage IIIb -Followed by Dr. Julien Nordmann, on chemotherapy, received last on 8/25  -CT abdomen also showed interval development of omental caking in LUQ suspicious for progressive metastatic disease - oncology consulted, overall prognosis is poor -Patient has expressed a desire to not continue with further chemotherapy  History of recent DVT -Dopplers on 8/16 showed extensive bilateral deep and superficial vein thrombosis, while on Coumadin -Patient was placed on therapeutic Lovenox indefinitely.    Hypertension, hyperlipidemia -Hold statins due to transaminitis -Holding ramipril for borderline blood pressures   Postoperative atrial fibrillation, mechanical AVR -Currently normal sinus rhythm, history of DCCV in 2009 -He has developed rapid atrial fibrillation on 8/31 -With borderline blood pressures, rate control would be challenging -Started on IV amiodarone with improvement of heart rate -Patient was ambulated on 9/2 became significantly tachycardic in atrial fibrillation -Metoprolol dose has been adjusted, continue to monitor   Obstructive sleep apnea -Continue CPAP nightly   COPD --currently stable, no wheezing -Placed on duo nebs as needed  Goals of care -Seen by palliative care to discuss further goals and his  wife feels that patient would want DNR -After further discussions with the patient and family, they have elected to have the patient discharged home with home health and outpatient palliative care services to follow -Physical therapy evaluated the patient and recommended home health PT   Code Status: DNR DVT Prophylaxis:    Therapeutic Lovenox   Level of Care: Level of care: Progressive Family Communication: Discussed all imaging results, lab results, explained to the patient    Disposition Plan:     Status  is: Inpatient  Remains inpatient appropriate because:Inpatient level of care appropriate due to severity of illness  Dispo: The patient is from: Home              Anticipated d/c is to: Home              Patient currently is not medically stable to d/c.   Difficult to place patient No      Time Spent in minutes   25 minutes  Procedures:  CT abdomen  Consultants:   General surgery Oncology Palliative care  Antimicrobials:   Anti-infectives (From admission, onward)    Start     Dose/Rate Route Frequency Ordered Stop   12/07/20 1700  cefTRIAXone (ROCEPHIN) 2 g in sodium chloride 0.9 % 100 mL IVPB  Status:  Discontinued       Note to Pharmacy: Pharmacy may adjust dosing strength, schedule, rate of infusion, etc as needed to optimize therapy   2 g 200 mL/hr over 30 Minutes Intravenous Every 24 hours 12/06/20 1836 12/07/20 1313   12/06/20 1900  cefTRIAXone (ROCEPHIN) 2 g in sodium chloride 0.9 % 100 mL IVPB  Status:  Discontinued        2 g 200 mL/hr over 30 Minutes Intravenous Every 24 hours 12/06/20 1855 12/06/20 1919   12/06/20 1715  cefTRIAXone (ROCEPHIN) 2 g in sodium chloride 0.9 % 100 mL IVPB        2 g 200 mL/hr over 30 Minutes Intravenous  Once 12/06/20 1710 12/06/20 1801          Medications  Scheduled Meds:  Chlorhexidine Gluconate Cloth  6 each Topical Daily   enoxaparin (LOVENOX) injection  75 mg Subcutaneous Q12H   fluticasone  2 spray Each Nare Daily   folic acid  1 mg Oral Daily   loratadine  10 mg Oral Daily   magic mouthwash w/lidocaine  15 mL Oral QID   metoprolol tartrate  25 mg Oral BID   mirtazapine  15 mg Oral QHS   Continuous Infusions:   PRN Meds:.ALPRAZolam, alum & mag hydroxide-simeth, HYDROmorphone (DILAUDID) injection, morphine CONCENTRATE, nitroGLYCERIN, polyethylene glycol, sodium chloride flush      Subjective:   Yaniv Lage continues to feel better.  Tolerating p.o. intake.  He is having bowel movements.  Was able to  ambulate with physical therapy today.  Objective:   Vitals:   12/10/20 2121 12/11/20 0423 12/11/20 0500 12/11/20 1353  BP: 104/68 111/74  128/80  Pulse: 88 (!) 101  (!) 101  Resp: (!) 22  (!) 22 20  Temp: 98.5 F (36.9 C) 98.2 F (36.8 C)  97.8 F (36.6 C)  TempSrc: Oral Oral  Oral  SpO2: 95% 92%  98%  Weight:      Height:        Intake/Output Summary (Last 24 hours) at 12/11/2020 1934 Last data filed at 12/11/2020 0400 Gross per 24 hour  Intake --  Output 700 ml  Net -700 ml  Wt Readings from Last 3 Encounters:  12/07/20 73 kg  12/03/20 73.6 kg  12/01/20 73.4 kg     Exam General exam: Alert, awake, oriented x 3 Respiratory system: Clear to auscultation. Respiratory effort normal. Cardiovascular system: Irregular. No murmurs, rubs, gallops. Gastrointestinal system: Abdomen is nondistended, soft and nontender. No organomegaly or masses felt. Normal bowel sounds heard. Central nervous system: Alert and oriented. No focal neurological deficits. Extremities: Right lower extremity edema Skin: No rashes, lesions or ulcers Psychiatry: Judgement and insight appear normal. Mood & affect appropriate.      Data Reviewed:  I have personally reviewed following labs and imaging studies  Micro Results Recent Results (from the past 240 hour(s))  Resp Panel by RT-PCR (Flu A&B, Covid) Nasopharyngeal Swab     Status: None   Collection Time: 12/06/20  5:22 PM   Specimen: Nasopharyngeal Swab; Nasopharyngeal(NP) swabs in vial transport medium  Result Value Ref Range Status   SARS Coronavirus 2 by RT PCR NEGATIVE NEGATIVE Final    Comment: (NOTE) SARS-CoV-2 target nucleic acids are NOT DETECTED.  The SARS-CoV-2 RNA is generally detectable in upper respiratory specimens during the acute phase of infection. The lowest concentration of SARS-CoV-2 viral copies this assay can detect is 138 copies/mL. A negative result does not preclude SARS-Cov-2 infection and should not be used as  the sole basis for treatment or other patient management decisions. A negative result may occur with  improper specimen collection/handling, submission of specimen other than nasopharyngeal swab, presence of viral mutation(s) within the areas targeted by this assay, and inadequate number of viral copies(<138 copies/mL). A negative result must be combined with clinical observations, patient history, and epidemiological information. The expected result is Negative.  Fact Sheet for Patients:  EntrepreneurPulse.com.au  Fact Sheet for Healthcare Providers:  IncredibleEmployment.be  This test is no t yet approved or cleared by the [place] FDA and  has been authorized for detection and/or diagnosis of SARS-CoV-2 by FDA under an Emergency Use Authorization (EUA). This EUA will remain  in effect (meaning this test can be used) for the duration of the COVID-19 declaration under Section 564(b)(1) of the Act, 21 U.S.C.section 360bbb-3(b)(1), unless the authorization is terminated  or revoked sooner.       Influenza A by PCR NEGATIVE NEGATIVE Final   Influenza B by PCR NEGATIVE NEGATIVE Final    Comment: (NOTE) The Xpert Xpress SARS-CoV-2/FLU/RSV plus assay is intended as an aid in the diagnosis of influenza from Nasopharyngeal swab specimens and should not be used as a sole basis for treatment. Nasal washings and aspirates are unacceptable for Xpert Xpress SARS-CoV-2/FLU/RSV testing.  Fact Sheet for Patients: EntrepreneurPulse.com.au  Fact Sheet for Healthcare Providers: IncredibleEmployment.be  This test is not yet approved or cleared by the [place] FDA and has been authorized for detection and/or diagnosis of SARS-CoV-2 by FDA under an Emergency Use Authorization (EUA). This EUA will remain in effect (meaning this test can be used) for the duration of the COVID-19 declaration under Section 564(b)(1) of  the Act, 21 U.S.C. section 360bbb-3(b)(1), unless the authorization is terminated or revoked.  Performed at [facility] [place] [address], [place], [place] [address]   Culture, blood (routine x 2)     Status: None (Preliminary result)   Collection Time: 12/06/20  7:45 PM   Specimen: BLOOD  Result Value Ref Range Status   Specimen Description   Final    BLOOD RIGHT WRIST Performed at [facility]  [address], [place], [place] [address]    Special Requests   Final    BOTTLES DRAWN AEROBIC AND ANAEROBIC Blood Culture results may not be optimal due to an inadequate volume of blood received in culture bottles Performed at [facility] [address] [place], [place] [address]    Culture   Final    NO GROWTH 4 DAYS Performed at [facility], [place] [address], [place], [place] [address]    Report Status PENDING  Incomplete  Culture, blood (routine x 2)     Status: None (Preliminary result)   Collection Time: 12/06/20  8:00 PM   Specimen: BLOOD  Result Value Ref Range Status   Specimen Description   Final    BLOOD PORTA CATH Performed at [facility] [address] [place], [place] [address]    Special Requests   Final    BOTTLES DRAWN AEROBIC AND ANAEROBIC Blood Culture adequate volume Performed at [facility] [address] [place], [place] [address]    Culture   Final    NO GROWTH 4 DAYS Performed at [facility], [place] [address], [place], [place] [address]    Report Status PENDING  Incomplete    Radiology Reports DG Chest 2 View  Result Date: 12/06/2020 CLINICAL DATA:  Chest pain. EXAM: CHEST - 2 VIEW COMPARISON:  November 23, 2020. FINDINGS: The heart size and mediastinal contours are within normal limits. Status post cardiac valve repair. Right internal jugular Port-A-Cath is unchanged in position. No pneumothorax is noted. Left lung is clear. Stable probable  scarring is noted in the right lung base. The visualized skeletal structures are unremarkable. IMPRESSION: Stable right lung scarring.  No acute abnormality seen. Electronically Signed   By: Marijo Conception M.D.   On: 12/06/2020 11:38   CT Angio Chest PE W/Cm &/Or Wo Cm  Result Date: 12/06/2020 CLINICAL DATA:  Concern for pulmonary embolism. History of lung cancer with chemotherapy ongoing. EXAM: CT ANGIOGRAPHY CHEST WITH CONTRAST TECHNIQUE: Multidetector CT imaging of the chest was performed using the standard protocol during bolus administration of intravenous contrast. Multiplanar CT image reconstructions and MIPs were obtained to evaluate the vascular anatomy. CONTRAST:  176mL OMNIPAQUE IOHEXOL 350 MG/ML SOLN COMPARISON:  CT chest 11/01/2020 FINDINGS: Cardiovascular: No filling defects within the pulmonary arteries to suggest acute pulmonary embolism. Post CABG anatomy. Mediastinum/Nodes: Port in the anterior chest wall with tip in distal SVC. No axillary or supraclavicular adenopathy. No mediastinal or hilar adenopathy. No pericardial fluid. Esophagus normal. Lungs/Pleura: Volume loss in the RIGHT hemithorax. Suture line in the upper lobe. No suspicious nodularity. Thickening along the fissure is similar prior. LEFT lung is mildly hyperinflated. No acute findings. Upper Abdomen: Limited view of the liver, kidneys, pancreas are unremarkable. Normal adrenal glands. Musculoskeletal: No aggressive osseous lesion. Review of the MIP images confirms the above findings. IMPRESSION: 1. No evidence acute pulmonary embolism. 2. No pulmonary infarction or pneumonia. 3. Postsurgical change in the RIGHT hemithorax without evidence local lung cancer recurrence. Electronically Signed   By: Suzy Bouchard M.D.   On: 12/06/2020 15:09   NM Hepatobiliary Liver Func  Result Date: 12/07/2020 CLINICAL DATA:  Biliary colic, recurrent, gallbladder dyskinesia suspected EXAM: NUCLEAR MEDICINE HEPATOBILIARY IMAGING TECHNIQUE:  Sequential images of the abdomen were obtained out to 60 minutes following intravenous administration of radiopharmaceutical. RADIOPHARMACEUTICALS:  5.5 mCi Tc-42m  Choletec IV COMPARISON:  12/06/2020 FINDINGS: Prompt uptake and biliary excretion of activity by the  liver is seen. Gallbladder activity is visualized, consistent with patency of cystic duct. Biliary activity passes into small bowel, consistent with patent common bile duct. IMPRESSION: Normal exam.  Patency of the cystic duct and common bile duct. Electronically Signed   By: Davina Poke D.O.   On: 12/07/2020 10:01   US Abdomen Limited  Result Date: 12/06/2020 CLINICAL DATA:  Right upper quadrant pain EXAM: ULTRASOUND ABDOMEN LIMITED RIGHT UPPER QUADRANT COMPARISON:  12/06/2020 FINDINGS: Gallbladder: Gallbladder is well distended with evidence of gallbladder sludge. No wall thickening or pericholecystic fluid is noted. Common bile duct: Diameter: Prominent up to 1.2 cm with distal normal tapering. No intrahepatic ductal dilatation is noted. Liver: No focal lesion identified. Within normal limits in parenchymal echogenicity. Portal vein is patent on color Doppler imaging with normal direction of blood flow towards the liver. Other: None. IMPRESSION: Mild gallbladder sludge. Mildly prominent common bile duct centrally with normal distal tapering similar to that seen on prior CT. No intrahepatic ductal dilatation is noted. Electronically Signed   By: Inez Catalina M.D.   On: 12/06/2020 16:52   DG Chest Port 1 View  Result Date: 11/23/2020 CLINICAL DATA:  History of lung cancer. EXAM: PORTABLE CHEST 1 VIEW COMPARISON:  Chest x-ray 09/07/2020 FINDINGS: The cardiac silhouette, mediastinal and hilar contours are within normal limits and stable. Stable surgical changes from cardiac surgery. Right IJ power port in good position without complicating features. Chronic pulmonary scarring changes.  Chronic right-sided pleural-. IMPRESSION: Chronic lung  changes but no acute pulmonary findings. Electronically Signed   By: Marijo Sanes M.D.   On: 11/23/2020 17:24   VAS [place] LOWER EXTREMITY VENOUS (DVT)  Result Date: 11/23/2020  Lower Venous DVT Study Patient Name:  PHILIPPE GANG Teschner  Date of Exam:   11/23/2020 Medical Rec #: [id number]      Accession #:    [id number] Date of Birth: Sep 27, 1945       Patient Gender: M Patient Age:   27 years Exam Location:  [facility] Procedure:      VAS [place] LOWER EXTREMITY VENOUS (DVT) Referring Phys: [facility] [name] --------------------------------------------------------------------------------  Indications: Patient presents today with complaints of acute onset of right lower extremity swelling x 3 days, and right groin pain x 1 day. He denies any symptoms involving the left lower extremity. He denies any SOB.  Risk Factors: Cancer of the lung and currently doing chemotherapy since August 2021. Anticoagulation: Coumadin.  Performing Technologist: Sharlett Iles RVT  Examination Guidelines: A complete evaluation includes B-mode imaging, spectral Doppler, color Doppler, and power Doppler as needed of all accessible portions of each vessel. Bilateral testing is considered an integral part of a complete examination. Limited examinations for reoccurring indications may be performed as noted. The reflux portion of the exam is performed with the patient in reverse Trendelenburg.  +---------+---------------+---------+-----------+---------------+--------------+ RIGHT    CompressibilityPhasicitySpontaneityProperties     Thrombus Aging +---------+---------------+---------+-----------+---------------+--------------+ CFV      None           No       No         softly         Age                                                        echogenic and  Indeterminate  brightly                                                                  echogenic                      +---------+---------------+---------+-----------+---------------+--------------+ SFJ      None           No       No         softly         Acute                                                      echogenic                     +---------+---------------+---------+-----------+---------------+--------------+ FV Prox  None           No       No         softly         Age                                                        echogenic and  Indeterminate                                              brightly                      +---------+---------------+---------+-----------+---------------+--------------+ FV Mid   None           No       No         softly         Acute                                                      echogenic                     +---------+---------------+---------+-----------+---------------+--------------+ FV DistalNone           No       No         softly         Acute                                                      echogenic                     +---------+---------------+---------+-----------+---------------+--------------+  PFV      None           No       No         softly         Age                                                        echogenic and  Indeterminate                                              brightly                                                                  echogenic                     +---------+---------------+---------+-----------+---------------+--------------+ POP      None           No       No         softly         Acute                                                      echogenic                     +---------+---------------+---------+-----------+---------------+--------------+ PTV      None           No       No         softly         Acute                                                      echogenic                      +---------+---------------+---------+-----------+---------------+--------------+ PERO     None           No       No         softly         Acute                                                      echogenic                     +---------+---------------+---------+-----------+---------------+--------------+ Soleal   None  No       No         softly         Acute                                                      echogenic                     +---------+---------------+---------+-----------+---------------+--------------+ Gastroc  None           No       No         softly         Acute                                                      echogenic                     +---------+---------------+---------+-----------+---------------+--------------+ GSV      None           No       No         softly         Acute                                                      echogenic                     +---------+---------------+---------+-----------+---------------+--------------+ SSV      None           No       No         softly         Age                                                        echogenic      Indeterminate  +---------+---------------+---------+-----------+---------------+--------------+ SPJ      None           No       No         softly         Acute                                                      echogenic                     +---------+---------------+---------+-----------+---------------+--------------+ TPT      None           No       No         softly  Acute                                                      echogenic                     +---------+---------------+---------+-----------+---------------+--------------+   Right Technical Findings: Acute non-occlusive thrombus in the distal external iliac vein. Acute occlusive thrombus noted in the SFJ, GSV, proximal to distal FV,  popliteal vein, gastrocnemius vein, TPT, PTV, popliteal vein and SPJ. Age Indeterminate occlusive thrombus noted in the  CFV, ostial FV, DFV and proximal SSV.  +---------+---------------+---------+-----------+---------------+--------------+ LEFT     CompressibilityPhasicitySpontaneityProperties     Thrombus Aging +---------+---------------+---------+-----------+---------------+--------------+ CFV      Full           Yes      Yes                                      +---------+---------------+---------+-----------+---------------+--------------+ SFJ      Full           Yes      Yes                                      +---------+---------------+---------+-----------+---------------+--------------+ FV Prox  Full           Yes      Yes                                      +---------+---------------+---------+-----------+---------------+--------------+ FV Mid   Full           Yes      Yes                                      +---------+---------------+---------+-----------+---------------+--------------+ FV DistalFull           Yes      Yes                                      +---------+---------------+---------+-----------+---------------+--------------+ PFV      None           No       No         softly         Acute                                                      echogenic                     +---------+---------------+---------+-----------+---------------+--------------+ POP      Full           Yes      Yes                                      +---------+---------------+---------+-----------+---------------+--------------+  PTV      None           No       No         softly         Acute                                                      echogenic                     +---------+---------------+---------+-----------+---------------+--------------+ PERO     None           No       No         softly         Acute                                                       echogenic                     +---------+---------------+---------+-----------+---------------+--------------+ Soleal   None           No       No         softly         Acute                                                      echogenic                     +---------+---------------+---------+-----------+---------------+--------------+ Gastroc  Full           Yes      Yes                                      +---------+---------------+---------+-----------+---------------+--------------+ GSV      None           No       No         softly         Acute                                                      echogenic                     +---------+---------------+---------+-----------+---------------+--------------+ SSV      None           No       No         softly         Acute  echogenic                     +---------+---------------+---------+-----------+---------------+--------------+ TPT      None           No       No         softly         Acute                                                      echogenic                     +---------+---------------+---------+-----------+---------------+--------------+  Left Technical Findings: Acute occlusive thrombus in the proximal GSV, not invading the SFJ (.37 cm from junction). Acute occlusive thrombus in the DFV, TPT, PTV, peroneal veins, SSV and soleal vein.  Findings reported to Dr. Gardiner Rhyme at 2:00 pm.  Summary: RIGHT: - Findings consistent with acute deep vein thrombosis involving the SF junction, right femoral vein, right popliteal vein, right posterior tibial veins, right peroneal veins, right soleal veins, right gastrocnemius veins, and TPT and SPJ. - Findings consistent with acute superficial vein thrombosis involving the right great saphenous vein. - Findings consistent with age indeterminate deep vein  thrombosis involving the right common femoral vein, right femoral vein, and right proximal profunda vein. - Findings consistent with age indeterminate superficial vein thrombosis involving the right small saphenous vein. - No cystic structure found in the popliteal fossa. - Findings consistent with acute deep vein thrombosis involving the distal external iliac vein.  LEFT: - Findings consistent with acute deep vein thrombosis involving the left proximal profunda vein, left posterior tibial veins, left peroneal veins, left soleal veins, and TPT. - Findings consistent with acute superficial vein thrombosis involving the left great saphenous vein, and left small saphenous vein. - No cystic structure found in the popliteal fossa.  Incidental findings: Abnormal dilatation of the proximal abdominal aorta, measuring 3.2 cm AP x 3.2 cm TRV. Suggest follow-up for AAA duplex. *See table(s) above for measurements and observations. Electronically signed by Ida Rogue MD on 11/23/2020 at 6:44:57 PM.    Final    CT Angio Abd/Pel W and/or Wo Contrast  Result Date: 12/06/2020 CLINICAL DATA:  Abdominal pain and shortness of breath History of abdominal aortic aneurysm EXAM: CTA ABDOMEN AND PELVIS WITHOUT AND WITH CONTRAST TECHNIQUE: Multidetector CT imaging of the abdomen and pelvis was performed using the standard protocol during bolus administration of intravenous contrast. Multiplanar reconstructed images and MIPs were obtained and reviewed to evaluate the vascular anatomy. CONTRAST:  175mL OMNIPAQUE IOHEXOL 350 MG/ML SOLN COMPARISON:  CT abdomen pelvis 08/01/2020 FINDINGS: VASCULAR Aorta: Diffuse atherosclerotic plaque of the abdominal aorta with aneurysm measuring up to 3.2 x 2.5 cm which is similar in size compared to prior examination where it measured 3.0 x 2.7 cm when measured in a similar manner. Celiac: Mild narrowing of the origin of the celiac artery. Evaluation of the branches of the celiac artery limited due to  suboptimal timing of contrast bolus. SMA: No significant stenosis. Renals: Mild narrowing at the origin of the renal arteries. IMA: Patent. Inflow: The right common iliac artery is mildly prominent measuring up to 1.7 cm compared to 1.6 cm on the prior study. There is likely at least moderate stenosis at the origin of  the right internal iliac artery. No significant stenosis of the right external iliac or common femoral arteries. The left common iliac artery is markedly tortuous with maximum diameter measuring 2.3 cm the overall diameter is not significantly changed since the prior examination. This is likely the site of focal dissection with partially thrombosed false lumen given appearance on prior CT. 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 No significant stenosis of the left external iliac, internal iliac or common femoral arteries. Proximal Outflow: No significant narrowing of visualized right proximal of flow. There is moderate to severe stenosis of the origin of the left superficial femoral artery. Veins: Acute thrombus noted within the right common femoral vein. Review of the MIP images confirms the above findings. NON-VASCULAR Lower chest: No acute abnormality. Hepatobiliary: No focal hepatic lesion. Mild thickening of the wall the gallbladder. Minimal perihepatic ascites. No bile duct dilatation. Portal vein is patent. Pancreas: Unremarkable. No pancreatic ductal dilatation or surrounding inflammatory changes. Spleen: Normal in size without focal abnormality. Adrenals/Urinary Tract: Adrenal glands are normal. Bilateral simple renal cysts are again seen. No significant abnormality of the bladder or ureters. Stomach/Bowel: Distal colonic diverticulosis without evidence of acute diverticulitis. Distal colonic diverticulosis without evidence of acute diverticulitis. There has been interval  development of omental caking with greatest concentration along the splenic flexure of the colon best seen on images 36-81 of series 4. Lymphatic: No enlarged abdominal or pelvic lymph nodes. Reproductive: Prostate is unremarkable. Other: Small amount of free fluid seen at the pelvic floor. Musculoskeletal: No acute or significant osseous findings. IMPRESSION: VASCULAR 1. Acute right common femoral deep venous thrombosis. 2. Unchanged abdominal aortic aneurysm measuring up to 3.2 x 2.5 cm. Recommend follow-up every 3 years. This recommendation follows ACR consensus guidelines: White Paper of the ACR Incidental Findings Committee II on Vascular Findings. J Am Coll Radiol 2013; 10:789-794. 3. 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 4. 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 5. Please note evaluation arterial vasculature is limited due to suboptimal contrast bolus timing. NON-VASCULAR 1. Mild gallbladder wall thickening is likely reactive. If the patient has symptoms of acute cholecystitis, further evaluation with right upper quadrant ultrasound should be performed. 2. Distal colonic diverticulosis without evidence of acute diverticulitis. 3. Interval development of left upper quadrant omental caking suspicious for progressive metastatic disease. These results were called by telephone at the time of interpretation on 12/06/2020 at 3:55 pm to provider Lacretia Leigh , who verbally acknowledged these results. Electronically Signed   By: Miachel Roux M.D.   On: 12/06/2020 15:57    Lab Data:  CBC: Recent Labs  Lab 12/07/20 0359 12/08/20 0320 12/09/20 0335 12/10/20 0430 12/11/20 1202  WBC 9.5 4.6 1.9* 2.8* 3.2*  HGB 7.7* 8.1* 8.4* 8.7* 8.3*  HCT 23.9* 25.2* 25.0* 26.5* 25.0*  MCV 94.1 91.6 90.9 91.1 90.9  PLT 156 130* 121* 106* 91*   Basic Metabolic Panel: Recent Labs  Lab 12/06/20 1214 12/07/20 0359  12/09/20 0335 12/11/20 1202  NA 132* 137 138 134*  K 3.9 4.1 3.6 3.5  CL 98 105 106 102  CO2 24 25 25 25   GLUCOSE 95 96 121* 103*  BUN 25* 19 18 18   CREATININE 0.93 0.79 0.73 0.72  CALCIUM 8.2* 8.2* 7.8* 7.4*   GFR: Estimated Creatinine Clearance:  82.4 mL/min (by C-G formula based on SCr of 0.72 mg/dL). Liver Function Tests: Recent Labs  Lab 12/06/20 1214 12/07/20 0359 12/09/20 0335  AST 139* 86* 53*  ALT 226* 160* 89*  ALKPHOS 153* 115 152*  BILITOT 1.3* 0.9 0.7  PROT 6.6 6.0* 5.4*  ALBUMIN 2.6* 2.3* 1.9*   No results for input(s): LIPASE, AMYLASE in the last 168 hours. No results for input(s): AMMONIA in the last 168 hours. Coagulation Profile: Recent Labs  Lab 12/06/20 1214  INR 1.1   Cardiac Enzymes: No results for input(s): CKTOTAL, CKMB, CKMBINDEX, TROPONINI in the last 168 hours. BNP (last 3 results) No results for input(s): PROBNP in the last 8760 hours. HbA1C: No results for input(s): HGBA1C in the last 72 hours. CBG: No results for input(s): GLUCAP in the last 168 hours. Lipid Profile: No results for input(s): CHOL, HDL, LDLCALC, TRIG, CHOLHDL, LDLDIRECT in the last 72 hours. Thyroid Function Tests: No results for input(s): TSH, T4TOTAL, FREET4, T3FREE, THYROIDAB in the last 72 hours. Anemia Panel: No results for input(s): VITAMINB12, FOLATE, FERRITIN, TIBC, IRON, RETICCTPCT in the last 72 hours. Urine analysis:    Component Value Date/Time   COLORURINE YELLOW 12/06/2020 1644   APPEARANCEUR HAZY (A) 12/06/2020 1644   LABSPEC 1.043 (H) 12/06/2020 1644   PHURINE 6.0 12/06/2020 1644   GLUCOSEU NEGATIVE 12/06/2020 1644   GLUCOSEU NEGATIVE 08/20/2020 0957   HGBUR NEGATIVE 12/06/2020 [place] 12/06/2020 1644   BILIRUBINUR negative 08/13/2020 0947   KETONESUR 5 (A) 12/06/2020 1644   PROTEINUR NEGATIVE 12/06/2020 1644   UROBILINOGEN 0.2 08/20/2020 0957   NITRITE NEGATIVE 12/06/2020 [place] 12/06/2020 1644      Kathie Dike M.D. [facility] 12/11/2020, 7:34 PM  Available via Epic secure chat 7am-7pm After 7 pm, please refer to night coverage provider listed on amion.

## 2020-12-12 DIAGNOSIS — Z515 Encounter for palliative care: Secondary | ICD-10-CM

## 2020-12-12 LAB — CULTURE, BLOOD (ROUTINE X 2)
Culture: NO GROWTH
Culture: NO GROWTH
Special Requests: ADEQUATE

## 2020-12-12 LAB — CBC
HCT: 22.8 % — ABNORMAL LOW (ref 39.0–52.0)
Hemoglobin: 7.4 g/dL — ABNORMAL LOW (ref 13.0–17.0)
MCH: 30.2 pg (ref 26.0–34.0)
MCHC: 32.5 g/dL (ref 30.0–36.0)
MCV: 93.1 fL (ref 80.0–100.0)
Platelets: 82 10*3/uL — ABNORMAL LOW (ref 150–400)
RBC: 2.45 MIL/uL — ABNORMAL LOW (ref 4.22–5.81)
RDW: 19.5 % — ABNORMAL HIGH (ref 11.5–15.5)
WBC: 3.4 10*3/uL — ABNORMAL LOW (ref 4.0–10.5)
nRBC: 0 % (ref 0.0–0.2)

## 2020-12-12 LAB — PREPARE RBC (CROSSMATCH)

## 2020-12-12 MED ORDER — HEPARIN SOD (PORK) LOCK FLUSH 100 UNIT/ML IV SOLN
500.0000 [IU] | INTRAVENOUS | Status: DC
  Administered 2020-12-12: 500 [IU]
  Filled 2020-12-12: qty 5

## 2020-12-12 MED ORDER — POLYETHYLENE GLYCOL 3350 17 G PO PACK: 17.0000 g | PACK | Freq: Every day | ORAL | 0 refills | Status: AC | PRN

## 2020-12-12 MED ORDER — METOPROLOL TARTRATE 25 MG PO TABS: 25.0000 mg | ORAL_TABLET | Freq: Two times a day (BID) | ORAL | 1 refills | Status: AC

## 2020-12-12 MED ORDER — MIRTAZAPINE 15 MG PO TABS: 15.0000 mg | ORAL_TABLET | Freq: Every day | ORAL | 1 refills | Status: DC

## 2020-12-12 MED ORDER — MORPHINE SULFATE (CONCENTRATE) 10 MG/0.5ML PO SOLN: 10.0000 mg | ORAL | 0 refills | Status: DC | PRN

## 2020-12-12 MED ORDER — ALTEPLASE 2 MG IJ SOLR
2.0000 mg | Freq: Once | INTRAMUSCULAR | Status: DC
  Filled 2020-12-12: qty 2

## 2020-12-12 MED ORDER — HEPARIN SOD (PORK) LOCK FLUSH 100 UNIT/ML IV SOLN
500.0000 [IU] | INTRAVENOUS | Status: DC | PRN
  Filled 2020-12-12: qty 5

## 2020-12-12 MED ORDER — SODIUM CHLORIDE 0.9% IV SOLUTION: Freq: Once | INTRAVENOUS | Status: DC

## 2020-12-12 NOTE — Plan of Care (Signed)
  Problem: Education: Goal: Knowledge of General Education information will improve Description: Including pain rating scale, medication(s)/side effects and non-pharmacologic comfort measures Outcome: Progressing   Problem: Health Behavior/Discharge Planning: Goal: Ability to manage health-related needs will improve Outcome: Progressing   Problem: Clinical Measurements: Goal: Will remain free from infection Outcome: Progressing   

## 2020-12-12 NOTE — Progress Notes (Signed)
Blood return very sluggish with numerous attempts for reposition/interventions related to port acces.TPA ordered, however, upon close assessment, the accessed port was slightly tilted. When manipulated, great blood return. Deaccessed and reaccessed port per protocol with GBR. Flushes easily. TPA order discontinued, as medication not needed at this time. Primary RN aware.

## 2020-12-12 NOTE — Discharge Summary (Signed)
Physician Discharge Summary  [name]:[id number] DOB: [birth year] DOA: 12/06/2020  PCP: Marcus Minium, MD  Admit date: 12/06/2020 Discharge date: 12/12/2020  Admitted From: Home Disposition: Home  Recommendations for Outpatient Follow-up:  Palliative care follow-up as outpatient to further address goals of care Follow-up with oncology as needed Repeat CBC in 1 week  Home Health: Home health PT, OT Equipment/Devices:  Discharge Condition: Stable CODE STATUS: DNR Diet recommendation: Heart healthy  Brief/Interim Summary: Patient is a 75 year old male with OSA, HTN, recently diagnosed bilateral DVT, COPD, CAD CABG x2, HTN, HLP severe aortic insufficiency status post mechanical AVR in 2008, on indefinite Lovenox, recurrent stage IIIb lung adeno CA on chemotherapy (followed by Dr. Julien Nordmann) was admitted with abdominal pain.  Patient had chemo on 8/25 (Thursday) without any issues.  However next day he started having abdominal pain, and continued to progressively worsen.  Patient reported pain in the right upper quadrant, sharp, intermittent, 10/10, associated with nausea.  No vomiting.  Had 3 episodes of diarrhea with no hematochezia or melena.  No fevers.  Decreased p.o. intake in the last 3 days.   CT abdomen showed acute right common femoral DVT, mild gallbladder wall thickening, likely reactive however possible acute cholecystitis.  Interval development of left upper quadrant omental caking suspicious for progressive metastatic disease. Ultrasound abdomen showed mild gallbladder sludge.  General surgery was consulted  Discharge Diagnoses:  Principal Problem:   Abdominal pain, diffuse Active Problems:   H/O aortic valve replacement   CAD (coronary artery disease)   S/P thoracic aortic aneurysm repair   Hx of CABG   Hypertension   Sleep apnea   COPD (chronic obstructive pulmonary disease) (HCC)   Adenocarcinoma of right lung, stage 3 (HCC)   Adenocarcinoma of lung, stage  3 (HCC)   Recurrent non-small cell lung cancer (HCC)   DVT (deep venous thrombosis) (HCC)   Failure to thrive in adult   Inguinal hernia, left - reducible   Chronic anticoagulation on SQ Lovenox (had DVT on Coumadin)   Recurrent deep vein thrombosis (DVT) (HCC)   Omental caking LUQ suspicious for metastatic cancer   Palliative care patient  Acute abdominal pain -Initially, concern about acute cholecystitis.  General surgery was consulted.  -placed on n.p.o. status, IV fluids, IV antibiotics, pain control -HIDA scan negative  - per surgery, no need for cholecystectomy, placed on clears -DC IV Rocephin -CT abdomen also showed interval development of omental caking in LUQ suspicious for progressive metastatic disease -Treat supportively with pain management   Active Problems:   History of recurrent non-small cell lung cancer, adenocarcinoma stage IIIb -Followed by Dr. Julien Nordmann, on chemotherapy, received last on 8/25  -CT abdomen also showed interval development of omental caking in LUQ suspicious for progressive metastatic disease - oncology consulted, overall prognosis is poor -Patient has expressed a desire to not continue with further chemotherapy   History of recent DVT -Dopplers on 8/16 showed extensive bilateral deep and superficial vein thrombosis, while on Coumadin -Patient was placed on therapeutic Lovenox indefinitely.     Hypertension, hyperlipidemia -Hold statins due to transaminitis -Holding ramipril for borderline blood pressures   Postoperative atrial fibrillation, mechanical AVR -Currently normal sinus rhythm, history of DCCV in 2009 -He has developed rapid atrial fibrillation on 8/31 -With borderline blood pressures, rate control would be challenging -Started on IV amiodarone with improvement of heart rate -Patient was ambulated on 9/2 became significantly tachycardic in atrial fibrillation -Metoprolol dose has been adjusted -Heart rate was subsequently  reasonably controlled, even with ambulation   Obstructive sleep apnea -Continue CPAP nightly   COPD --currently stable, no wheezing -Placed on duo nebs as needed  Anemia -Suspect related to recent chemotherapy -He did not have any evidence of bleeding -Hemoglobin down to 7.4 on the day of discharge -He was transfused 1 unit PRBC prior to discharge -He can have repeat CBC in 1 week   Goals of care -Seen by palliative care to discuss further goals and his wife feels that patient would want DNR -After further discussions with the patient and family, they have elected to have the patient discharged home with home health and outpatient palliative care services to follow -Physical therapy evaluated the patient and recommended home health PT  Discharge Instructions  Discharge Instructions     Diet - low sodium heart healthy   Complete by: As directed    Increase activity slowly   Complete by: As directed       Allergies as of 12/12/2020       Reactions   Amlodipine Anaphylaxis   Weakness and fatigue, also   Sulfa Antibiotics Shortness Of Breath   Tetanus Toxoids Anaphylaxis   Crestor [rosuvastatin] Other (See Comments)   Myalgia    Penicillins Hives   Has patient had a PCN reaction causing immediate rash, facial/tongue/throat swelling, SOB or lightheadedness with hypotension: No Has patient had a PCN reaction causing severe rash involving mucus membranes or skin necrosis: Yes Has patient had a PCN reaction that required hospitalization: No Has patient had a PCN reaction occurring within the last 10 years: No If all of the above answers are "NO", then may proceed with Cephalosporin use.        Medication List     STOP taking these medications    atorvastatin 80 MG tablet Commonly known as: LIPITOR   dexamethasone 4 MG tablet Commonly known as: DECADRON   ramipril 10 MG capsule Commonly known as: ALTACE       TAKE these medications    acetaminophen 500 MG  tablet Commonly known as: TYLENOL Take 500 mg by mouth every 6 (six) hours as needed for mild pain or headache.   ALPRAZolam 0.25 MG tablet Commonly known as: XANAX TAKE 1 TABLET (0.25 MG TOTAL) BY MOUTH EVERY 8 (EIGHT) HOURS AS NEEDED FOR UP TO 20 DOSES FOR ANXIETY.   cetirizine 10 MG tablet Commonly known as: ZYRTEC Take 10 mg by mouth at bedtime.   enoxaparin 80 MG/0.8ML injection Commonly known as: LOVENOX Inject 0.8 mLs (80 mg total) into the skin 2 (two) times daily.   folic acid 1 MG tablet Commonly known as: FOLVITE TAKE 1 TABLET BY MOUTH EVERY DAY What changed: when to take this   furosemide 20 MG tablet Commonly known as: LASIX Take 20 mg by mouth as needed for edema.   lidocaine-prilocaine cream Commonly known as: EMLA Apply 1 application topically as needed. What changed: reasons to take this   metoprolol tartrate 25 MG tablet Commonly known as: LOPRESSOR Take 1 tablet (25 mg total) by mouth 2 (two) times daily.   mirtazapine 15 MG tablet Commonly known as: REMERON Take 1 tablet (15 mg total) by mouth at bedtime.   morphine CONCENTRATE 10 MG/0.5ML Soln concentrated solution Take 0.5 mLs (10 mg total) by mouth every 4 (four) hours as needed for moderate pain.   ONE-A-DAY 55 PLUS PO Take 1 tablet by mouth daily with breakfast.   polyethylene glycol 17 g packet Commonly known as: MIRALAX /  GLYCOLAX Take 17 g by mouth daily as needed for mild constipation.   PreserVision AREDS 2 Caps Take 1 capsule by mouth daily with breakfast.   prochlorperazine 10 MG tablet Commonly known as: COMPAZINE Take 1 tablet (10 mg total) by mouth every 6 (six) hours as needed. What changed: reasons to take this   QUNOL ULTRA COQ10 PO Take 1 capsule by mouth daily with breakfast.        Allergies  Allergen Reactions   Amlodipine Anaphylaxis    Weakness and fatigue, also    Sulfa Antibiotics Shortness Of Breath   Tetanus Toxoids Anaphylaxis   Crestor  [Rosuvastatin] Other (See Comments)    Myalgia    Penicillins Hives    Has patient had a PCN reaction causing immediate rash, facial/tongue/throat swelling, SOB or lightheadedness with hypotension: No Has patient had a PCN reaction causing severe rash involving mucus membranes or skin necrosis: Yes Has patient had a PCN reaction that required hospitalization: No Has patient had a PCN reaction occurring within the last 10 years: No If all of the above answers are "NO", then may proceed with Cephalosporin use.     Consultations: Oncology Palliative care General surgery   Procedures/Studies: DG Chest 2 View  Result Date: 12/06/2020 CLINICAL DATA:  Chest pain. EXAM: CHEST - 2 VIEW COMPARISON:  November 23, 2020. FINDINGS: The heart size and mediastinal contours are within normal limits. Status post cardiac valve repair. Right internal jugular Port-A-Cath is unchanged in position. No pneumothorax is noted. Left lung is clear. Stable probable scarring is noted in the right lung base. The visualized skeletal structures are unremarkable. IMPRESSION: Stable right lung scarring.  No acute abnormality seen. Electronically Signed   By: Marijo Conception M.D.   On: 12/06/2020 11:38   CT Angio Chest PE W/Cm &/Or Wo Cm  Result Date: 12/06/2020 CLINICAL DATA:  Concern for pulmonary embolism. History of lung cancer with chemotherapy ongoing. EXAM: CT ANGIOGRAPHY CHEST WITH CONTRAST TECHNIQUE: Multidetector CT imaging of the chest was performed using the standard protocol during bolus administration of intravenous contrast. Multiplanar CT image reconstructions and MIPs were obtained to evaluate the vascular anatomy. CONTRAST:  155mL OMNIPAQUE IOHEXOL 350 MG/ML SOLN COMPARISON:  CT chest 11/01/2020 FINDINGS: Cardiovascular: No filling defects within the pulmonary arteries to suggest acute pulmonary embolism. Post CABG anatomy. Mediastinum/Nodes: Port in the anterior chest wall with tip in distal SVC. No axillary or  supraclavicular adenopathy. No mediastinal or hilar adenopathy. No pericardial fluid. Esophagus normal. Lungs/Pleura: Volume loss in the RIGHT hemithorax. Suture line in the upper lobe. No suspicious nodularity. Thickening along the fissure is similar prior. LEFT lung is mildly hyperinflated. No acute findings. Upper Abdomen: Limited view of the liver, kidneys, pancreas are unremarkable. Normal adrenal glands. Musculoskeletal: No aggressive osseous lesion. Review of the MIP images confirms the above findings. IMPRESSION: 1. No evidence acute pulmonary embolism. 2. No pulmonary infarction or pneumonia. 3. Postsurgical change in the RIGHT hemithorax without evidence local lung cancer recurrence. Electronically Signed   By: Suzy Bouchard M.D.   On: 12/06/2020 15:09   NM Hepatobiliary Liver Func  Result Date: 12/07/2020 CLINICAL DATA:  Biliary colic, recurrent, gallbladder dyskinesia suspected EXAM: NUCLEAR MEDICINE HEPATOBILIARY IMAGING TECHNIQUE: Sequential images of the abdomen were obtained out to 60 minutes following intravenous administration of radiopharmaceutical. RADIOPHARMACEUTICALS:  5.5 mCi Tc-27m  Choletec IV COMPARISON:  12/06/2020 FINDINGS: Prompt uptake and biliary excretion of activity by the liver is seen. Gallbladder activity is visualized, consistent with patency of  cystic duct. Biliary activity passes into small bowel, consistent with patent common bile duct. IMPRESSION: Normal exam.  Patency of the cystic duct and common bile duct. Electronically Signed   By: Davina Poke D.O.   On: 12/07/2020 10:01   US Abdomen Limited  Result Date: 12/06/2020 CLINICAL DATA:  Right upper quadrant pain EXAM: ULTRASOUND ABDOMEN LIMITED RIGHT UPPER QUADRANT COMPARISON:  12/06/2020 FINDINGS: Gallbladder: Gallbladder is well distended with evidence of gallbladder sludge. No wall thickening or pericholecystic fluid is noted. Common bile duct: Diameter: Prominent up to 1.2 cm with distal normal tapering.  No intrahepatic ductal dilatation is noted. Liver: No focal lesion identified. Within normal limits in parenchymal echogenicity. Portal vein is patent on color Doppler imaging with normal direction of blood flow towards the liver. Other: None. IMPRESSION: Mild gallbladder sludge. Mildly prominent common bile duct centrally with normal distal tapering similar to that seen on prior CT. No intrahepatic ductal dilatation is noted. Electronically Signed   By: Inez Catalina M.D.   On: 12/06/2020 16:52   DG Chest Port 1 View  Result Date: 11/23/2020 CLINICAL DATA:  History of lung cancer. EXAM: PORTABLE CHEST 1 VIEW COMPARISON:  Chest x-ray 09/07/2020 FINDINGS: The cardiac silhouette, mediastinal and hilar contours are within normal limits and stable. Stable surgical changes from cardiac surgery. Right IJ power port in good position without complicating features. Chronic pulmonary scarring changes.  Chronic right-sided pleural-. IMPRESSION: Chronic lung changes but no acute pulmonary findings. Electronically Signed   By: Marijo Sanes M.D.   On: 11/23/2020 17:24   VAS [place] LOWER EXTREMITY VENOUS (DVT)  Result Date: 11/23/2020  Lower Venous DVT Study Patient Name:  Marcus [name] Thivierge  Date of Exam:   11/23/2020 Medical Rec #: [id number]      Accession #:    [id number] Date of Birth: 05-07-1945       Patient Gender: M Patient Age:   56 years Exam Location:  [facility] Procedure:      VAS [place] LOWER EXTREMITY VENOUS (DVT) Referring Phys: [facility] [name] --------------------------------------------------------------------------------  Indications: Patient presents today with complaints of acute onset of right lower extremity swelling x 3 days, and right groin pain x 1 day. He denies any symptoms involving the left lower extremity. He denies any SOB.  Risk Factors: Cancer of the lung and currently doing chemotherapy since August 2021. Anticoagulation: Coumadin.  Performing Technologist: Sharlett Iles RVT  Examination Guidelines: A  complete evaluation includes B-mode imaging, spectral Doppler, color Doppler, and power Doppler as needed of all accessible portions of each vessel. Bilateral testing is considered an integral part of a complete examination. Limited examinations for reoccurring indications may be performed as noted. The reflux portion of the exam is performed with the patient in reverse Trendelenburg.  +---------+---------------+---------+-----------+---------------+--------------+ RIGHT    CompressibilityPhasicitySpontaneityProperties     Thrombus Aging +---------+---------------+---------+-----------+---------------+--------------+ CFV      None           No       No         softly         Age                                                        echogenic and  Indeterminate  brightly                                                                  echogenic                     +---------+---------------+---------+-----------+---------------+--------------+ SFJ      None           No       No         softly         Acute                                                      echogenic                     +---------+---------------+---------+-----------+---------------+--------------+ FV Prox  None           No       No         softly         Age                                                        echogenic and  Indeterminate                                              brightly                      +---------+---------------+---------+-----------+---------------+--------------+ FV Mid   None           No       No         softly         Acute                                                      echogenic                     +---------+---------------+---------+-----------+---------------+--------------+ FV DistalNone           No       No         softly         Acute                                                       echogenic                     +---------+---------------+---------+-----------+---------------+--------------+  PFV      None           No       No         softly         Age                                                        echogenic and  Indeterminate                                              brightly                                                                  echogenic                     +---------+---------------+---------+-----------+---------------+--------------+ POP      None           No       No         softly         Acute                                                      echogenic                     +---------+---------------+---------+-----------+---------------+--------------+ PTV      None           No       No         softly         Acute                                                      echogenic                     +---------+---------------+---------+-----------+---------------+--------------+ PERO     None           No       No         softly         Acute                                                      echogenic                     +---------+---------------+---------+-----------+---------------+--------------+ Soleal   None  No       No         softly         Acute                                                      echogenic                     +---------+---------------+---------+-----------+---------------+--------------+ Gastroc  None           No       No         softly         Acute                                                      echogenic                     +---------+---------------+---------+-----------+---------------+--------------+ GSV      None           No       No         softly         Acute                                                      echogenic                      +---------+---------------+---------+-----------+---------------+--------------+ SSV      None           No       No         softly         Age                                                        echogenic      Indeterminate  +---------+---------------+---------+-----------+---------------+--------------+ SPJ      None           No       No         softly         Acute                                                      echogenic                     +---------+---------------+---------+-----------+---------------+--------------+ TPT      None           No       No         softly  Acute                                                      echogenic                     +---------+---------------+---------+-----------+---------------+--------------+   Right Technical Findings: Acute non-occlusive thrombus in the distal external iliac vein. Acute occlusive thrombus noted in the SFJ, GSV, proximal to distal FV, popliteal vein, gastrocnemius vein, TPT, PTV, popliteal vein and SPJ. Age Indeterminate occlusive thrombus noted in the  CFV, ostial FV, DFV and proximal SSV.  +---------+---------------+---------+-----------+---------------+--------------+ LEFT     CompressibilityPhasicitySpontaneityProperties     Thrombus Aging +---------+---------------+---------+-----------+---------------+--------------+ CFV      Full           Yes      Yes                                      +---------+---------------+---------+-----------+---------------+--------------+ SFJ      Full           Yes      Yes                                      +---------+---------------+---------+-----------+---------------+--------------+ FV Prox  Full           Yes      Yes                                      +---------+---------------+---------+-----------+---------------+--------------+ FV Mid   Full           Yes      Yes                                       +---------+---------------+---------+-----------+---------------+--------------+ FV DistalFull           Yes      Yes                                      +---------+---------------+---------+-----------+---------------+--------------+ PFV      None           No       No         softly         Acute                                                      echogenic                     +---------+---------------+---------+-----------+---------------+--------------+ POP      Full           Yes      Yes                                      +---------+---------------+---------+-----------+---------------+--------------+  PTV      None           No       No         softly         Acute                                                      echogenic                     +---------+---------------+---------+-----------+---------------+--------------+ PERO     None           No       No         softly         Acute                                                      echogenic                     +---------+---------------+---------+-----------+---------------+--------------+ Soleal   None           No       No         softly         Acute                                                      echogenic                     +---------+---------------+---------+-----------+---------------+--------------+ Gastroc  Full           Yes      Yes                                      +---------+---------------+---------+-----------+---------------+--------------+ GSV      None           No       No         softly         Acute                                                      echogenic                     +---------+---------------+---------+-----------+---------------+--------------+ SSV      None           No       No         softly         Acute  echogenic                      +---------+---------------+---------+-----------+---------------+--------------+ TPT      None           No       No         softly         Acute                                                      echogenic                     +---------+---------------+---------+-----------+---------------+--------------+  Left Technical Findings: Acute occlusive thrombus in the proximal GSV, not invading the SFJ (.37 cm from junction). Acute occlusive thrombus in the DFV, TPT, PTV, peroneal veins, SSV and soleal vein.  Findings reported to Dr. Gardiner Rhyme at 2:00 pm.  Summary: RIGHT: - Findings consistent with acute deep vein thrombosis involving the SF junction, right femoral vein, right popliteal vein, right posterior tibial veins, right peroneal veins, right soleal veins, right gastrocnemius veins, and TPT and SPJ. - Findings consistent with acute superficial vein thrombosis involving the right great saphenous vein. - Findings consistent with age indeterminate deep vein thrombosis involving the right common femoral vein, right femoral vein, and right proximal profunda vein. - Findings consistent with age indeterminate superficial vein thrombosis involving the right small saphenous vein. - No cystic structure found in the popliteal fossa. - Findings consistent with acute deep vein thrombosis involving the distal external iliac vein.  LEFT: - Findings consistent with acute deep vein thrombosis involving the left proximal profunda vein, left posterior tibial veins, left peroneal veins, left soleal veins, and TPT. - Findings consistent with acute superficial vein thrombosis involving the left great saphenous vein, and left small saphenous vein. - No cystic structure found in the popliteal fossa.  Incidental findings: Abnormal dilatation of the proximal abdominal aorta, measuring 3.2 cm AP x 3.2 cm TRV. Suggest follow-up for AAA duplex. *See table(s) above for measurements and observations. Electronically signed by  Ida Rogue MD on 11/23/2020 at 6:44:57 PM.    Final    CT Angio Abd/Pel W and/or Wo Contrast  Result Date: 12/06/2020 CLINICAL DATA:  Abdominal pain and shortness of breath History of abdominal aortic aneurysm EXAM: CTA ABDOMEN AND PELVIS WITHOUT AND WITH CONTRAST TECHNIQUE: Multidetector CT imaging of the abdomen and pelvis was performed using the standard protocol during bolus administration of intravenous contrast. Multiplanar reconstructed images and MIPs were obtained and reviewed to evaluate the vascular anatomy. CONTRAST:  150mL OMNIPAQUE IOHEXOL 350 MG/ML SOLN COMPARISON:  CT abdomen pelvis 08/01/2020 FINDINGS: VASCULAR Aorta: Diffuse atherosclerotic plaque of the abdominal aorta with aneurysm measuring up to 3.2 x 2.5 cm which is similar in size compared to prior examination where it measured 3.0 x 2.7 cm when measured in a similar manner. Celiac: Mild narrowing of the origin of the celiac artery. Evaluation of the branches of the celiac artery limited due to suboptimal timing of contrast bolus. SMA: No significant stenosis. Renals: Mild narrowing at the origin of the renal arteries. IMA: Patent. Inflow: The right common iliac artery is mildly prominent measuring up to 1.7 cm compared to 1.6 cm on the prior study. There is likely at least moderate stenosis at the origin of  the right internal iliac artery. No significant stenosis of the right external iliac or common femoral arteries. The left common iliac artery is markedly tortuous with maximum diameter measuring 2.3 cm the overall diameter is not significantly changed since the prior examination. This is likely the site of focal dissection with partially thrombosed false lumen given appearance on prior CT. 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 No significant stenosis of the left external iliac, internal iliac or common femoral  arteries. Proximal Outflow: No significant narrowing of visualized right proximal of flow. There is moderate to severe stenosis of the origin of the left superficial femoral artery. Veins: Acute thrombus noted within the right common femoral vein. Review of the MIP images confirms the above findings. NON-VASCULAR Lower chest: No acute abnormality. Hepatobiliary: No focal hepatic lesion. Mild thickening of the wall the gallbladder. Minimal perihepatic ascites. No bile duct dilatation. Portal vein is patent. Pancreas: Unremarkable. No pancreatic ductal dilatation or surrounding inflammatory changes. Spleen: Normal in size without focal abnormality. Adrenals/Urinary Tract: Adrenal glands are normal. Bilateral simple renal cysts are again seen. No significant abnormality of the bladder or ureters. Stomach/Bowel: Distal colonic diverticulosis without evidence of acute diverticulitis. Distal colonic diverticulosis without evidence of acute diverticulitis. There has been interval development of omental caking with greatest concentration along the splenic flexure of the colon best seen on images 36-81 of series 4. Lymphatic: No enlarged abdominal or pelvic lymph nodes. Reproductive: Prostate is unremarkable. Other: Small amount of free fluid seen at the pelvic floor. Musculoskeletal: No acute or significant osseous findings. IMPRESSION: VASCULAR 1. Acute right common femoral deep venous thrombosis. 2. Unchanged abdominal aortic aneurysm measuring up to 3.2 x 2.5 cm. Recommend follow-up every 3 years. This recommendation follows ACR consensus guidelines: White Paper of the ACR Incidental Findings Committee II on Vascular Findings. J Am Coll Radiol 2013; 10:789-794. 3. 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 4. 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 5. Please note evaluation arterial vasculature is limited due to  suboptimal contrast bolus timing. NON-VASCULAR 1. Mild gallbladder wall thickening is likely reactive. If the patient has symptoms of acute cholecystitis, further evaluation with right upper quadrant ultrasound should be performed. 2. Distal colonic diverticulosis without evidence of acute diverticulitis. 3. Interval development of left upper quadrant omental caking suspicious for progressive metastatic disease. These results were called by telephone at the time of interpretation on 12/06/2020 at 3:55 pm to provider Lacretia Leigh , who verbally acknowledged these results. Electronically Signed   By: Miachel Roux M.D.   On: 12/06/2020 15:57      Subjective: Feels well.  Does not have any abdominal pain.  No shortness of breath.  He is having bowel movements.  Discharge Exam: Vitals:   12/12/20 1445 12/12/20 1549 12/12/20 1606 12/12/20 1845  BP: 120/77 116/76  117/72  Pulse: 90 94 90 90  Resp: 19 (!) 23 20 18   Temp: 97.9 F (36.6 C) 97.6 F (36.4 C) 97.6 F (36.4 C) 98.7 F (37.1 C)  TempSrc: Oral Oral Oral Oral  SpO2: 97% 97% 97% 96%  Weight:      Height:        General: Pt is alert, awake, not in acute distress Cardiovascular: RRR, S1/S2 +, no rubs, no gallops Respiratory: CTA bilaterally, no wheezing, no  rhonchi Abdominal: Soft, NT, ND, bowel sounds + Extremities: no edema, no cyanosis    The results of significant diagnostics from this hospitalization (including imaging, microbiology, ancillary and laboratory) are listed below for reference.     Microbiology: Recent Results (from the past 240 hour(s))  Resp Panel by RT-PCR (Flu A&B, Covid) Nasopharyngeal Swab     Status: None   Collection Time: 12/06/20  5:22 PM   Specimen: Nasopharyngeal Swab; Nasopharyngeal(NP) swabs in vial transport medium  Result Value Ref Range Status   SARS Coronavirus 2 by RT PCR NEGATIVE NEGATIVE Final    Comment: (NOTE) SARS-CoV-2 target nucleic acids are NOT DETECTED.  The SARS-CoV-2 RNA is  generally detectable in upper respiratory specimens during the acute phase of infection. The lowest concentration of SARS-CoV-2 viral copies this assay can detect is 138 copies/mL. A negative result does not preclude SARS-Cov-2 infection and should not be used as the sole basis for treatment or other patient management decisions. A negative result may occur with  improper specimen collection/handling, submission of specimen other than nasopharyngeal swab, presence of viral mutation(s) within the areas targeted by this assay, and inadequate number of viral copies(<138 copies/mL). A negative result must be combined with clinical observations, patient history, and epidemiological information. The expected result is Negative.  Fact Sheet for Patients:  EntrepreneurPulse.com.au  Fact Sheet for Healthcare Providers:  IncredibleEmployment.be  This test is no t yet approved or cleared by the [place] FDA and  has been authorized for detection and/or diagnosis of SARS-CoV-2 by FDA under an Emergency Use Authorization (EUA). This EUA will remain  in effect (meaning this test can be used) for the duration of the COVID-19 declaration under Section 564(b)(1) of the Act, 21 U.S.C.section 360bbb-3(b)(1), unless the authorization is terminated  or revoked sooner.       Influenza A by PCR NEGATIVE NEGATIVE Final   Influenza B by PCR NEGATIVE NEGATIVE Final    Comment: (NOTE) The Xpert Xpress SARS-CoV-2/FLU/RSV plus assay is intended as an aid in the diagnosis of influenza from Nasopharyngeal swab specimens and should not be used as a sole basis for treatment. Nasal washings and aspirates are unacceptable for Xpert Xpress SARS-CoV-2/FLU/RSV testing.  Fact Sheet for Patients: EntrepreneurPulse.com.au  Fact Sheet for Healthcare Providers: IncredibleEmployment.be  This test is not yet approved or cleared by the [place] FDA and has been authorized for detection and/or diagnosis of SARS-CoV-2 by FDA under an Emergency Use Authorization (EUA). This EUA will remain in effect (meaning this test can be used) for the duration of the COVID-19 declaration under Section 564(b)(1) of the Act, 21 U.S.C. section 360bbb-3(b)(1), unless the authorization is terminated or revoked.  Performed at [facility], [facility] [address] [place], [place] [address]   Culture, blood (routine x 2)     Status: None   Collection Time: 12/06/20  7:45 PM   Specimen: BLOOD  Result Value Ref Range Status   Specimen Description   Final    BLOOD RIGHT WRIST Performed at [facility] [address] [place], [place] [address]    Special Requests   Final    BOTTLES DRAWN AEROBIC AND ANAEROBIC Blood Culture results may not be optimal due to an inadequate volume of blood received in culture bottles Performed at [facility] [address] [place], [place] [address]    Culture   Final    NO GROWTH 5 DAYS Performed at [facility], [place]  [address], [place], [place] [address]    Report Status 12/12/2020 FINAL  Final  Culture, blood (routine x 2)     Status: None   Collection Time: 12/06/20  8:00 PM   Specimen: BLOOD  Result Value Ref Range Status   Specimen Description   Final    BLOOD PORTA CATH Performed at [facility] [address] [place], [place] [address]    Special Requests   Final    BOTTLES DRAWN AEROBIC AND ANAEROBIC Blood Culture adequate volume Performed at [facility] [address], [place], [place] [address]    Culture   Final    NO GROWTH 5 DAYS Performed at [facility], [place] [address] [place], [place] [address]    Report Status 12/12/2020 FINAL  Final     Labs: BNP (last 3 results) No results for input(s): BNP in the last 8760 hours. Basic Metabolic Panel: Recent Labs  Lab 12/06/20 1214  12/07/20 0359 12/09/20 0335 12/11/20 1202  NA 132* 137 138 134*  K 3.9 4.1 3.6 3.5  CL 98 105 106 102  CO2 24 25 25 25   GLUCOSE 95 96 121* 103*  BUN 25* 19 18 18   CREATININE 0.93 0.79 0.73 0.72  CALCIUM 8.2* 8.2* 7.8* 7.4*   Liver Function Tests: Recent Labs  Lab 12/06/20 1214 12/07/20 0359 12/09/20 0335  AST 139* 86* 53*  ALT 226* 160* 89*  ALKPHOS 153* 115 152*  BILITOT 1.3* 0.9 0.7  PROT 6.6 6.0* 5.4*  ALBUMIN 2.6* 2.3* 1.9*   No results for input(s): LIPASE, AMYLASE in the last 168 hours. No results for input(s): AMMONIA in the last 168 hours. CBC: Recent Labs  Lab 12/08/20 0320 12/09/20 0335 12/10/20 0430 12/11/20 1202 12/12/20 0328  WBC 4.6 1.9* 2.8* 3.2* 3.4*  HGB 8.1* 8.4* 8.7* 8.3* 7.4*  HCT 25.2* 25.0* 26.5* 25.0* 22.8*  MCV 91.6 90.9 91.1 90.9 93.1  PLT 130* 121* 106* 91* 82*   Cardiac Enzymes: No results for input(s): CKTOTAL, CKMB, CKMBINDEX, TROPONINI in the last 168 hours. BNP: Invalid input(s): POCBNP CBG: No results for input(s): GLUCAP in the last 168 hours. D-Dimer No results for input(s): DDIMER in the last 72 hours. Hgb A1c No results for input(s): HGBA1C in the last 72 hours. Lipid Profile No results for input(s): CHOL, HDL, LDLCALC, TRIG, CHOLHDL, LDLDIRECT in the last 72 hours. Thyroid function studies No results for input(s): TSH, T4TOTAL, T3FREE, THYROIDAB in the last 72 hours.  Invalid input(s): FREET3 Anemia work up No results for input(s): VITAMINB12, FOLATE, FERRITIN, TIBC, IRON, RETICCTPCT in the last 72 hours. Urinalysis    Component Value Date/Time   COLORURINE YELLOW 12/06/2020 1644   APPEARANCEUR HAZY (A) 12/06/2020 1644   LABSPEC 1.043 (H) 12/06/2020 1644   PHURINE 6.0 12/06/2020 1644   GLUCOSEU NEGATIVE 12/06/2020 1644   GLUCOSEU NEGATIVE 08/20/2020 0957   HGBUR NEGATIVE 12/06/2020 1644   BILIRUBINUR NEGATIVE 12/06/2020 1644   BILIRUBINUR negative 08/13/2020 0947   KETONESUR 5 (A) 12/06/2020 1644   PROTEINUR  NEGATIVE 12/06/2020 1644   UROBILINOGEN 0.2 08/20/2020 0957   NITRITE NEGATIVE 12/06/2020 [place] 12/06/2020 1644   Sepsis Labs Invalid input(s): PROCALCITONIN,  WBC,  LACTICIDVEN Microbiology Recent Results (from the past 240 hour(s))  Resp Panel by RT-PCR (Flu A&B, Covid) Nasopharyngeal Swab     Status: None   Collection Time: 12/06/20  5:22 PM   Specimen: Nasopharyngeal Swab; Nasopharyngeal(NP) swabs in vial transport medium  Result  Value Ref Range Status   SARS Coronavirus 2 by RT PCR NEGATIVE NEGATIVE Final    Comment: (NOTE) SARS-CoV-2 target nucleic acids are NOT DETECTED.  The SARS-CoV-2 RNA is generally detectable in upper respiratory specimens during the acute phase of infection. The lowest concentration of SARS-CoV-2 viral copies this assay can detect is 138 copies/mL. A negative result does not preclude SARS-Cov-2 infection and should not be used as the sole basis for treatment or other patient management decisions. A negative result may occur with  improper specimen collection/handling, submission of specimen other than nasopharyngeal swab, presence of viral mutation(s) within the areas targeted by this assay, and inadequate number of viral copies(<138 copies/mL). A negative result must be combined with clinical observations, patient history, and epidemiological information. The expected result is Negative.  Fact Sheet for Patients:  EntrepreneurPulse.com.au  Fact Sheet for Healthcare Providers:  IncredibleEmployment.be  This test is no t yet approved or cleared by the [place] FDA and  has been authorized for detection and/or diagnosis of SARS-CoV-2 by FDA under an Emergency Use Authorization (EUA). This EUA will remain  in effect (meaning this test can be used) for the duration of the COVID-19 declaration under Section 564(b)(1) of the Act, 21 U.S.C.section 360bbb-3(b)(1), unless the authorization is  terminated  or revoked sooner.       Influenza A by PCR NEGATIVE NEGATIVE Final   Influenza B by PCR NEGATIVE NEGATIVE Final    Comment: (NOTE) The Xpert Xpress SARS-CoV-2/FLU/RSV plus assay is intended as an aid in the diagnosis of influenza from Nasopharyngeal swab specimens and should not be used as a sole basis for treatment. Nasal washings and aspirates are unacceptable for Xpert Xpress SARS-CoV-2/FLU/RSV testing.  Fact Sheet for Patients: EntrepreneurPulse.com.au  Fact Sheet for Healthcare Providers: IncredibleEmployment.be  This test is not yet approved or cleared by the [place] FDA and has been authorized for detection and/or diagnosis of SARS-CoV-2 by FDA under an Emergency Use Authorization (EUA). This EUA will remain in effect (meaning this test can be used) for the duration of the COVID-19 declaration under Section 564(b)(1) of the Act, 21 U.S.C. section 360bbb-3(b)(1), unless the authorization is terminated or revoked.  Performed at [facility], [place] [address], [place], [place] [address]   Culture, blood (routine x 2)     Status: None   Collection Time: 12/06/20  7:45 PM   Specimen: BLOOD  Result Value Ref Range Status   Specimen Description   Final    BLOOD RIGHT WRIST Performed at [facility] [address] [place], [place] [address]    Special Requests   Final    BOTTLES DRAWN AEROBIC AND ANAEROBIC Blood Culture results may not be optimal due to an inadequate volume of blood received in culture bottles Performed at [facility] [address], [place], [place] [address]    Culture   Final    NO GROWTH 5 DAYS Performed at [facility], [place] [address], [place], [place] [address]    Report Status 12/12/2020 FINAL  Final  Culture, blood (routine x 2)     Status: None   Collection Time: 12/06/20  8:00 PM   Specimen: BLOOD  Result Value Ref Range  Status   Specimen Description   Final    BLOOD PORTA CATH Performed at [facility] [address], [place], [place] [address]    Special Requests   Final    BOTTLES DRAWN AEROBIC AND  ANAEROBIC Blood Culture adequate volume Performed at [facility] [address], [place], [place] [address]    Culture   Final    NO GROWTH 5 DAYS Performed at [facility], [place] [address], [place], [place] [address]    Report Status 12/12/2020 FINAL  Final     Time coordinating discharge: 46mins  SIGNED:   Kathie Dike, MD  [facility] 12/12/2020, 7:54 PM   If 7PM-7AM, please contact night-coverage [email or web address]

## 2020-12-12 NOTE — Plan of Care (Signed)
Patient discharged per MD order

## 2020-12-12 NOTE — Progress Notes (Signed)
[facility])  Referral received for outpatient palliative care services once pt is discharged (with anticipation of hospice once pt works with [facility] PT/OT).  [facility] will continue to follow and begin outpt palliative care once Marcus [name] is back in the home.  Venia Carbon RN, BSN, [facility] Liaison

## 2020-12-12 NOTE — TOC Transition Note (Signed)
Transition of Care [facility]) - CM/SW Discharge Note   Patient Details  Name: Marcus [name] MRN: [id number] Date of Birth: 12/01/1945  Transition of Care [facility]) CM/SW Contact:  Illene Regulus, LCSW Phone Number: 12/12/2020, 3:20 PM   Clinical Narrative:    CSW spoke with Anderson Malta confirmed acceptance of palliative referral , CSW confirmed with Verda Cumins acceptance of [facility] services.     Barriers to Discharge: No Barriers Identified   Patient Goals and CMS Choice     Choice offered to / list presented to : Patient  Discharge Placement                       Discharge Plan and Services                          HH Arranged: PT, OT, RN [facility] Agency: [place] Date [place]: 12/12/20 Time [place]: [phone] Representative spoke with at [place]: cheryl  Social Determinants of Health ([place]) Interventions     Readmission Risk Interventions Readmission Risk Prevention Plan 11/25/2020  PCP or Specialist Appt within 3-5 Days Complete  Some recent data might be hidden

## 2020-12-12 NOTE — Progress Notes (Signed)
Patient transported off unit at 2000 hrs to be transported home by wife.

## 2020-12-13 LAB — BPAM RBC
Blood Product Expiration Date: 202209292359
ISSUE DATE / TIME: 202209041540
Unit Type and Rh: 5100

## 2020-12-13 LAB — TYPE AND SCREEN
ABO/RH(D): O POS
Antibody Screen: NEGATIVE
Unit division: 0

## 2020-12-14 ENCOUNTER — Telehealth: Payer: Self-pay | Admitting: Family Medicine

## 2020-12-14 ENCOUNTER — Encounter: Payer: Self-pay | Admitting: Internal Medicine

## 2020-12-15 ENCOUNTER — Other Ambulatory Visit: Payer: Medicare HMO

## 2020-12-15 ENCOUNTER — Ambulatory Visit: Payer: Medicare HMO

## 2020-12-15 ENCOUNTER — Ambulatory Visit: Payer: Medicare HMO | Admitting: Physician Assistant

## 2020-12-16 ENCOUNTER — Other Ambulatory Visit: Payer: Self-pay | Admitting: Internal Medicine

## 2020-12-16 ENCOUNTER — Encounter: Payer: Self-pay | Admitting: Internal Medicine

## 2020-12-16 ENCOUNTER — Other Ambulatory Visit (HOSPITAL_COMMUNITY): Payer: Self-pay

## 2020-12-16 DIAGNOSIS — I2510 Atherosclerotic heart disease of native coronary artery without angina pectoris: Secondary | ICD-10-CM | POA: Diagnosis not present

## 2020-12-16 DIAGNOSIS — Z951 Presence of aortocoronary bypass graft: Secondary | ICD-10-CM | POA: Diagnosis not present

## 2020-12-16 DIAGNOSIS — C3491 Malignant neoplasm of unspecified part of right bronchus or lung: Secondary | ICD-10-CM | POA: Diagnosis not present

## 2020-12-16 DIAGNOSIS — E785 Hyperlipidemia, unspecified: Secondary | ICD-10-CM | POA: Diagnosis not present

## 2020-12-16 DIAGNOSIS — G4733 Obstructive sleep apnea (adult) (pediatric): Secondary | ICD-10-CM | POA: Diagnosis not present

## 2020-12-16 DIAGNOSIS — J449 Chronic obstructive pulmonary disease, unspecified: Secondary | ICD-10-CM | POA: Diagnosis not present

## 2020-12-16 DIAGNOSIS — I4891 Unspecified atrial fibrillation: Secondary | ICD-10-CM | POA: Diagnosis not present

## 2020-12-16 DIAGNOSIS — K810 Acute cholecystitis: Secondary | ICD-10-CM | POA: Diagnosis not present

## 2020-12-16 DIAGNOSIS — I82413 Acute embolism and thrombosis of femoral vein, bilateral: Secondary | ICD-10-CM | POA: Diagnosis not present

## 2020-12-16 DIAGNOSIS — I10 Essential (primary) hypertension: Secondary | ICD-10-CM | POA: Diagnosis not present

## 2020-12-16 MED ORDER — MORPHINE SULFATE (CONCENTRATE) 10 MG/0.5ML PO SOLN
10.0000 mg | ORAL | 0 refills | Status: AC | PRN
  Filled 2020-12-16: qty 180, 60d supply, fill #0

## 2020-12-16 NOTE — Progress Notes (Signed)
Order(s) created erroneously. Erroneous order ID: [id number]  Order moved by: Emerson Monte  Order move date/time: 12/16/2020 9:43 AM  Source Patient: [id number]  Source Contact: 07/20/2047  Destination Patient: [id number]  Destination Contact: 07/19/2017

## 2020-12-16 NOTE — Progress Notes (Signed)
Order(s) created erroneously. Erroneous order ID: [id number]  Order moved by: Emerson Monte  Order move date/time: 12/16/2020 9:43 AM  Source Patient: [id number]  Source Contact: 07/20/2047  Destination Patient: [id number]  Destination Contact: 07/19/2017

## 2020-12-16 NOTE — Telephone Encounter (Signed)
Call provided number, option 2, went to voicemail.  Left voicemail to call back and I can provide further information at that time if needed for palliative care consult.

## 2020-12-17 ENCOUNTER — Other Ambulatory Visit (HOSPITAL_COMMUNITY): Payer: Self-pay

## 2020-12-17 ENCOUNTER — Telehealth: Payer: Self-pay

## 2020-12-17 ENCOUNTER — Other Ambulatory Visit: Payer: Self-pay | Admitting: Internal Medicine

## 2020-12-17 MED ORDER — MORPHINE SULFATE (CONCENTRATE) 10 MG /0.5 ML PO SOLN: 10.0000 mg | ORAL | 0 refills | Status: DC | PRN

## 2020-12-17 NOTE — Telephone Encounter (Signed)
Stacy returning Dr Vonna Kotyk call about the palliative care consult. I let her know that he was busy with patients at the moment but I would let him know she called and for him to call her back at his earliest convenience.

## 2020-12-17 NOTE — Telephone Encounter (Signed)
Chart reviewed from recent hospitalization:  -CT abdomen also showed interval development of omental caking in LUQ suspicious for progressive metastatic disease - oncology consulted, overall prognosis is poor -Patient has expressed a desire to not continue with further chemotherapy   Called Stacy at [facility]. Verbal order provided for palliative care consult. Will forward to PCP as FYI.

## 2020-12-17 NOTE — Progress Notes (Deleted)
[name] OFFICE PROGRESS NOTE  [name], MD [address] [place] [address]  DIAGNOSIS: Recurrent non-small cell lung cancer, adenocarcinoma initially diagnosed as stage IIIb (T3, N2, M0) non-small cell lung cancer, adenocarcinoma presented with right upper lobe lung mass in addition to right hilar and mediastinal lymphadenopathy. Diagnosed in December 08, 2019. In April 2022, he had signs of disease recurrence with extensive pleural involvement, likely extending into extrapleural fat, in the RIGHT chest compatible with disease recurrence and associated with nodal disease in the mediastinum. 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 Equivocal uptake in the LEFT adrenal gland.  She had further evidence of disease progression August 2022 with progressive omental caking.   Molecular Studies: Negative for actionable mutations   PDL1: 1-49%  PRIOR THERAPY:  1) Right upper lobectomy with lymph node sampling under the care of Dr. Kipp Brood on December 08, 2019. 2) Adjuvant systemic chemotherapy with Cisplatin 75 mg/m2 and Alimta 500 mg/m2 IV every 3 weeks. Last dose on 04/13/20.  Status post 4 cycles. 3) Palliative systemic chemotherapy with carboplatin for an AUC of 5, Alimta, 500 mg/m2, and Keytruda 200 mg/m2. First dose expected on 09/01/20. Status post 6 cycles.  Starting from cycle #5, the patient started on maintenance Alimta and Keytruda.  CURRENT THERAPY: ***  INTERVAL HISTORY: Marcus [name] 75 y.o. male returns to clinic today for follow-up visit accompanied by his wife.  The patient was recently hospitalized from 12/06/2020 to 12/12/2020.  The patient was admitted for the chief complaint of abdominal pain.  He also has an acute DVT in the right common femoral vein.  Initial imaging showed concern for acute cholecystitis however his HIDA scan was negative and no surgery was recommended.  He received IV antibiotics and pain  control.  Unfortunately, the patient scan also showed progressive disease with omental caking concerning for metastatic disease.  The patient did not want further chemotherapy and wished to be discharged with home health and  ***. palliative care.  Regarding pain management, he is currently taking ****.   He was started on Remeron for an antidepressant as well as to help with his decreased appetite.   Overall, the patient wishes to **.  He denies any fever, chills, or night sweats.  His pain is ***he denies any significant cough, dyspnea on exertion, and denies any hemoptysis.  Right-sided chest pain?  He denies any nausea, vomiting, diarrhea, or constipation.  He denies any headache or visual changes.  He denies any rashes or skin changes.  He is here today for evaluation and for more detailed discussion about his current condition and recommended treatment options.     MEDICAL HISTORY: Past Medical History:  Diagnosis Date   Atrial fibrillation ([place])    DVT (deep venous thrombosis) ([facility])    Dyspnea    with exertion    Dysrhythmia    Hypertension    Obstructive sleep apnea 10/25/2007   cpap   S/P AVR    2D ECHO, 04/25/2011 - EF >55%, Right ventricle-mild-moderately dilated   Swelling of limb    LEA VENOUS, 08/22/2009 - no evidence of deep vein or superficial thrombosis; partially rupturing Baker's Cyst    ALLERGIES:  is allergic to amlodipine, sulfa antibiotics, tetanus toxoids, crestor [rosuvastatin], and penicillins.  MEDICATIONS:  Current Outpatient Medications  Medication Sig Dispense Refill   acetaminophen (TYLENOL) 500 MG tablet Take 500 mg by mouth every 6 (six) hours as needed for  mild pain or headache.     ALPRAZolam (XANAX) 0.25 MG tablet TAKE 1 TABLET (0.25 MG TOTAL) BY MOUTH EVERY 8 (EIGHT) HOURS AS NEEDED FOR UP TO 20 DOSES FOR ANXIETY. 20 tablet 0   cetirizine (ZYRTEC) 10 MG tablet Take 10 mg by mouth at bedtime.     Coenzyme Q10-Vitamin E (QUNOL ULTRA COQ10 PO) Take 1  capsule by mouth daily with breakfast.     enoxaparin (LOVENOX) 80 MG/0.8ML injection Inject 0.8 mLs (80 mg total) into the skin 2 (two) times daily. 48 mL 2   folic acid (FOLVITE) 1 MG tablet TAKE 1 TABLET BY MOUTH EVERY DAY (Patient taking differently: Take 1 mg by mouth daily with breakfast.) 30 tablet 2   furosemide (LASIX) 20 MG tablet Take 20 mg by mouth as needed for edema.     lidocaine-prilocaine (EMLA) cream Apply 1 application topically as needed. (Patient taking differently: Apply 1 application topically as needed (as directed).) 30 g 2   metoprolol tartrate (LOPRESSOR) 25 MG tablet Take 1 tablet (25 mg total) by mouth 2 (two) times daily. 60 tablet 1   mirtazapine (REMERON) 15 MG tablet Take 1 tablet (15 mg total) by mouth at bedtime. 30 tablet 1   Morphine Sulfate (MORPHINE CONCENTRATE) 10 MG/0.5ML SOLN concentrated solution Take 0.5 mLs (10 mg total) by mouth every 4 (four) hours as needed for moderate pain. 180 mL 0   Multiple Vitamin (ONE-A-DAY 55 PLUS PO) Take 1 tablet by mouth daily with breakfast.     Multiple Vitamins-Minerals (PRESERVISION AREDS 2) CAPS Take 1 capsule by mouth daily with breakfast.     polyethylene glycol (MIRALAX / GLYCOLAX) 17 g packet Take 17 g by mouth daily as needed for mild constipation. 14 each 0   prochlorperazine (COMPAZINE) 10 MG tablet Take 1 tablet (10 mg total) by mouth every 6 (six) hours as needed. (Patient taking differently: Take 10 mg by mouth every 6 (six) hours as needed for nausea or vomiting.) 30 tablet 2   No current facility-administered medications for this visit.    SURGICAL HISTORY:  Past Surgical History:  Procedure Laterality Date   CARDIAC CATHETERIZATION Bilateral 05/10/2007   Significant 1-vessel disease, severely dilated aortic root with moderate severe aortic insufficiency   CARDIAC SURGERY     CARDIOVERSION  08/02/2007   150 Joule biphasic shock with restoration of sinus rhythm. Heart rate 60.   cataract surgery       COLONOSCOPY WITH PROPOFOL N/A 12/15/2016   Procedure: COLONOSCOPY WITH PROPOFOL;  Surgeon: Hung, Patrick, MD;  Location: [facility];  Service: Endoscopy;  Laterality: N/A;   EYE SURGERY     INTERCOSTAL NERVE BLOCK Right 12/08/2019   Procedure: INTERCOSTAL NERVE BLOCK;  Surgeon: Lightfoot, Harrell O, MD;  Location: [facility] OR;  Service: Thoracic;  Laterality: Right;   IR IMAGING GUIDED PORT INSERTION  09/28/2020   IR THORACENTESIS ASP PLEURAL SPACE W/IMG GUIDE  08/05/2020   NM MYOVIEW LTD  04/08/2007   No evidence of inducible myocardial ischemia   NODE DISSECTION Right 12/08/2019   Procedure: NODE DISSECTION;  Surgeon: Lightfoot, Harrell O, MD;  Location: [facility] OR;  Service: Thoracic;  Laterality: Right;   THYROIDECTOMY, PARTIAL     torn meniscus in right knee surgery       REVIEW OF SYSTEMS:   Review of Systems  Constitutional: Negative for appetite change, chills, fatigue, fever and unexpected weight change.  HENT:   Negative for mouth sores, nosebleeds, sore throat and trouble swallowing.     Eyes: Negative for eye problems and icterus.  Respiratory: Negative for cough, hemoptysis, shortness of breath and wheezing.   Cardiovascular: Negative for chest pain and leg swelling.  Gastrointestinal: Negative for abdominal pain, constipation, diarrhea, nausea and vomiting.  Genitourinary: Negative for bladder incontinence, difficulty urinating, dysuria, frequency and hematuria.   Musculoskeletal: Negative for back pain, gait problem, neck pain and neck stiffness.  Skin: Negative for itching and rash.  Neurological: Negative for dizziness, extremity weakness, gait problem, headaches, light-headedness and seizures.  Hematological: Negative for adenopathy. Does not bruise/bleed easily.  Psychiatric/Behavioral: Negative for confusion, depression and sleep disturbance. The patient is not nervous/anxious.     PHYSICAL EXAMINATION:  There were no vitals taken for this visit.  ECOG PERFORMANCE STATUS: {CHL  ONC ECOG [id number]  Physical Exam  Constitutional: Oriented to person, place, and time and well-developed, well-nourished, and in no distress. No distress.  HENT:  Head: Normocephalic and atraumatic.  Mouth/Throat: Oropharynx is clear and moist. No oropharyngeal exudate.  Eyes: Conjunctivae are normal. Right eye exhibits no discharge. Left eye exhibits no discharge. No scleral icterus.  Neck: Normal range of motion. Neck supple.  Cardiovascular: Normal rate, regular rhythm, normal heart sounds and intact distal pulses.   Pulmonary/Chest: Effort normal and breath sounds normal. No respiratory distress. No wheezes. No rales.  Abdominal: Soft. Bowel sounds are normal. Exhibits no distension and no mass. There is no tenderness.  Musculoskeletal: Normal range of motion. Exhibits no edema.  Lymphadenopathy:    No cervical adenopathy.  Neurological: Alert and oriented to person, place, and time. Exhibits normal muscle tone. Gait normal. Coordination normal.  Skin: Skin is warm and dry. No rash noted. Not diaphoretic. No erythema. No pallor.  Psychiatric: Mood, memory and judgment normal.  Vitals reviewed.  LABORATORY DATA: Lab Results  Component Value Date   WBC 3.4 (L) 12/12/2020   HGB 7.4 (L) 12/12/2020   HCT 22.8 (L) 12/12/2020   MCV 93.1 12/12/2020   PLT 82 (L) 12/12/2020      Chemistry      Component Value Date/Time   NA 134 (L) 12/11/2020 1202   NA 139 10/28/2019 1057   K 3.5 12/11/2020 1202   CL 102 12/11/2020 1202   CO2 25 12/11/2020 1202   BUN 18 12/11/2020 1202   BUN 10 10/28/2019 1057   CREATININE 0.72 12/11/2020 1202   CREATININE 0.79 12/01/2020 0856      Component Value Date/Time   CALCIUM 7.4 (L) 12/11/2020 1202   ALKPHOS 152 (H) 12/09/2020 0335   AST 53 (H) 12/09/2020 0335   AST 67 (H) 12/01/2020 0856   ALT 89 (H) 12/09/2020 0335   ALT 50 (H) 12/01/2020 0856   BILITOT 0.7 12/09/2020 0335   BILITOT 0.8 12/01/2020 0856       RADIOGRAPHIC  STUDIES:  DG Chest 2 View  Result Date: 12/06/2020 CLINICAL DATA:  Chest pain. EXAM: CHEST - 2 VIEW COMPARISON:  November 23, 2020. FINDINGS: The heart size and mediastinal contours are within normal limits. Status post cardiac valve repair. Right internal jugular Port-A-Cath is unchanged in position. No pneumothorax is noted. Left lung is clear. Stable probable scarring is noted in the right lung base. The visualized skeletal structures are unremarkable. IMPRESSION: Stable right lung scarring.  No acute abnormality seen. Electronically Signed   By: Marijo Conception M.D.   On: 12/06/2020 11:38   CT Angio Chest PE W/Cm &/Or Wo Cm  Result Date: 12/06/2020 CLINICAL DATA:  Concern for pulmonary embolism. History of  lung cancer with chemotherapy ongoing. EXAM: CT ANGIOGRAPHY CHEST WITH CONTRAST TECHNIQUE: Multidetector CT imaging of the chest was performed using the standard protocol during bolus administration of intravenous contrast. Multiplanar CT image reconstructions and MIPs were obtained to evaluate the vascular anatomy. CONTRAST:  132m OMNIPAQUE IOHEXOL 350 MG/ML SOLN COMPARISON:  CT chest 11/01/2020 FINDINGS: Cardiovascular: No filling defects within the pulmonary arteries to suggest acute pulmonary embolism. Post CABG anatomy. Mediastinum/Nodes: Port in the anterior chest wall with tip in distal SVC. No axillary or supraclavicular adenopathy. No mediastinal or hilar adenopathy. No pericardial fluid. Esophagus normal. Lungs/Pleura: Volume loss in the RIGHT hemithorax. Suture line in the upper lobe. No suspicious nodularity. Thickening along the fissure is similar prior. LEFT lung is mildly hyperinflated. No acute findings. Upper Abdomen: Limited view of the liver, kidneys, pancreas are unremarkable. Normal adrenal glands. Musculoskeletal: No aggressive osseous lesion. Review of the MIP images confirms the above findings. IMPRESSION: 1. No evidence acute pulmonary embolism. 2. No pulmonary infarction or  pneumonia. 3. Postsurgical change in the RIGHT hemithorax without evidence local lung cancer recurrence. Electronically Signed   By: SSuzy BouchardM.D.   On: 12/06/2020 15:09   NM Hepatobiliary Liver Func  Result Date: 12/07/2020 CLINICAL DATA:  Biliary colic, recurrent, gallbladder dyskinesia suspected EXAM: NUCLEAR MEDICINE HEPATOBILIARY IMAGING TECHNIQUE: Sequential images of the abdomen were obtained out to 60 minutes following intravenous administration of radiopharmaceutical. RADIOPHARMACEUTICALS:  5.5 mCi Tc-933mCholetec IV COMPARISON:  12/06/2020 FINDINGS: Prompt uptake and biliary excretion of activity by the liver is seen. Gallbladder activity is visualized, consistent with patency of cystic duct. Biliary activity passes into small bowel, consistent with patent common bile duct. IMPRESSION: Normal exam.  Patency of the cystic duct and common bile duct. Electronically Signed   By: NiDavina Poke.O.   On: 12/07/2020 10:01   USKoreabdomen Limited  Result Date: 12/06/2020 CLINICAL DATA:  Right upper quadrant pain EXAM: ULTRASOUND ABDOMEN LIMITED RIGHT UPPER QUADRANT COMPARISON:  12/06/2020 FINDINGS: Gallbladder: Gallbladder is well distended with evidence of gallbladder sludge. No wall thickening or pericholecystic fluid is noted. Common bile duct: Diameter: Prominent up to 1.2 cm with distal normal tapering. No intrahepatic ductal dilatation is noted. Liver: No focal lesion identified. Within normal limits in parenchymal echogenicity. Portal vein is patent on color Doppler imaging with normal direction of blood flow towards the liver. Other: None. IMPRESSION: Mild gallbladder sludge. Mildly prominent common bile duct centrally with normal distal tapering similar to that seen on prior CT. No intrahepatic ductal dilatation is noted. Electronically Signed   By: MaInez Catalina.D.   On: 12/06/2020 16:52   DG Chest Port 1 View  Result Date: 11/23/2020 CLINICAL DATA:  History of lung cancer. EXAM:  PORTABLE CHEST 1 VIEW COMPARISON:  Chest x-ray 09/07/2020 FINDINGS: The cardiac silhouette, mediastinal and hilar contours are within normal limits and stable. Stable surgical changes from cardiac surgery. Right IJ power port in good position without complicating features. Chronic pulmonary scarring changes.  Chronic right-sided pleural-. IMPRESSION: Chronic lung changes but no acute pulmonary findings. Electronically Signed   By: P.Marijo Sanes.D.   On: 11/23/2020 17:24   VAS [place]OWER EXTREMITY VENOUS (DVT)  Result Date: 11/23/2020  Lower Venous DVT Study Patient Name:  DORYLEIGH BUENGERILL  Date of Exam:   11/23/2020 Medical Rec #: [id number]    Accession #:    [id number] of Birth: [birth year]     Patient Gender: M Patient Age:  74 years Exam Location:  [facility] Procedure:      VAS [place] LOWER EXTREMITY VENOUS (DVT) Referring Phys: [facility] [name] --------------------------------------------------------------------------------  Indications: Patient presents today with complaints of acute onset of right lower extremity swelling x 3 days, and right groin pain x 1 day. He denies any symptoms involving the left lower extremity. He denies any SOB.  Risk Factors: Cancer of the lung and currently doing chemotherapy since August 2021. Anticoagulation: Coumadin.  Performing Technologist: Sharlett Iles RVT  Examination Guidelines: A complete evaluation includes B-mode imaging, spectral Doppler, color Doppler, and power Doppler as needed of all accessible portions of each vessel. Bilateral testing is considered an integral part of a complete examination. Limited examinations for reoccurring indications may be performed as noted. The reflux portion of the exam is performed with the patient in reverse Trendelenburg.  +---------+---------------+---------+-----------+---------------+--------------+ RIGHT    CompressibilityPhasicitySpontaneityProperties     Thrombus Aging  +---------+---------------+---------+-----------+---------------+--------------+ CFV      None           No       No         softly         Age                                                        echogenic and  Indeterminate                                              brightly                                                                  echogenic                     +---------+---------------+---------+-----------+---------------+--------------+ SFJ      None           No       No         softly         Acute                                                      echogenic                     +---------+---------------+---------+-----------+---------------+--------------+ FV Prox  None           No       No         softly         Age  echogenic and  Indeterminate                                              brightly                      +---------+---------------+---------+-----------+---------------+--------------+ FV Mid   None           No       No         softly         Acute                                                      echogenic                     +---------+---------------+---------+-----------+---------------+--------------+ FV DistalNone           No       No         softly         Acute                                                      echogenic                     +---------+---------------+---------+-----------+---------------+--------------+ PFV      None           No       No         softly         Age                                                        echogenic and  Indeterminate                                              brightly                                                                  echogenic                     +---------+---------------+---------+-----------+---------------+--------------+ POP       None           No       No         softly         Acute  echogenic                     +---------+---------------+---------+-----------+---------------+--------------+ PTV      None           No       No         softly         Acute                                                      echogenic                     +---------+---------------+---------+-----------+---------------+--------------+ PERO     None           No       No         softly         Acute                                                      echogenic                     +---------+---------------+---------+-----------+---------------+--------------+ Soleal   None           No       No         softly         Acute                                                      echogenic                     +---------+---------------+---------+-----------+---------------+--------------+ Gastroc  None           No       No         softly         Acute                                                      echogenic                     +---------+---------------+---------+-----------+---------------+--------------+ GSV      None           No       No         softly         Acute                                                      echogenic                     +---------+---------------+---------+-----------+---------------+--------------+   SSV      None           No       No         softly         Age                                                        echogenic      Indeterminate  +---------+---------------+---------+-----------+---------------+--------------+ SPJ      None           No       No         softly         Acute                                                      echogenic                     +---------+---------------+---------+-----------+---------------+--------------+ TPT       None           No       No         softly         Acute                                                      echogenic                     +---------+---------------+---------+-----------+---------------+--------------+   Right Technical Findings: Acute non-occlusive thrombus in the distal external iliac vein. Acute occlusive thrombus noted in the SFJ, GSV, proximal to distal FV, popliteal vein, gastrocnemius vein, TPT, PTV, popliteal vein and SPJ. Age Indeterminate occlusive thrombus noted in the  CFV, ostial FV, DFV and proximal SSV.  +---------+---------------+---------+-----------+---------------+--------------+ LEFT     CompressibilityPhasicitySpontaneityProperties     Thrombus Aging +---------+---------------+---------+-----------+---------------+--------------+ CFV      Full           Yes      Yes                                      +---------+---------------+---------+-----------+---------------+--------------+ SFJ      Full           Yes      Yes                                      +---------+---------------+---------+-----------+---------------+--------------+ FV Prox  Full           Yes      Yes                                      +---------+---------------+---------+-----------+---------------+--------------+ FV Mid   Full  Yes      Yes                                      +---------+---------------+---------+-----------+---------------+--------------+ FV DistalFull           Yes      Yes                                      +---------+---------------+---------+-----------+---------------+--------------+ PFV      None           No       No         softly         Acute                                                      echogenic                     +---------+---------------+---------+-----------+---------------+--------------+ POP      Full           Yes      Yes                                       +---------+---------------+---------+-----------+---------------+--------------+ PTV      None           No       No         softly         Acute                                                      echogenic                     +---------+---------------+---------+-----------+---------------+--------------+ PERO     None           No       No         softly         Acute                                                      echogenic                     +---------+---------------+---------+-----------+---------------+--------------+ Soleal   None           No       No         softly         Acute                                                        echogenic                     +---------+---------------+---------+-----------+---------------+--------------+ Gastroc  Full           Yes      Yes                                      +---------+---------------+---------+-----------+---------------+--------------+ GSV      None           No       No         softly         Acute                                                      echogenic                     +---------+---------------+---------+-----------+---------------+--------------+ SSV      None           No       No         softly         Acute                                                      echogenic                     +---------+---------------+---------+-----------+---------------+--------------+ TPT      None           No       No         softly         Acute                                                      echogenic                     +---------+---------------+---------+-----------+---------------+--------------+  Left Technical Findings: Acute occlusive thrombus in the proximal GSV, not invading the SFJ (.37 cm from junction). Acute occlusive thrombus in the DFV, TPT, PTV, peroneal veins, SSV and soleal vein.  Findings reported to Dr. Schumann at 2:00 pm.   Summary: RIGHT: - Findings consistent with acute deep vein thrombosis involving the SF junction, right femoral vein, right popliteal vein, right posterior tibial veins, right peroneal veins, right soleal veins, right gastrocnemius veins, and TPT and SPJ. - Findings consistent with acute superficial vein thrombosis involving the right great saphenous vein. - Findings consistent with age indeterminate deep vein thrombosis involving the right common femoral vein, right femoral vein, and right proximal profunda vein. - Findings consistent with age indeterminate superficial vein thrombosis involving the right small saphenous vein. - No cystic structure found in the popliteal fossa. - Findings consistent with acute deep vein thrombosis involving the distal external iliac vein.  LEFT: - Findings consistent with acute deep vein thrombosis   involving the left proximal profunda vein, left posterior tibial veins, left peroneal veins, left soleal veins, and TPT. - Findings consistent with acute superficial vein thrombosis involving the left great saphenous vein, and left small saphenous vein. - No cystic structure found in the popliteal fossa.  Incidental findings: Abnormal dilatation of the proximal abdominal aorta, measuring 3.2 cm AP x 3.2 cm TRV. Suggest follow-up for AAA duplex. *See table(s) above for measurements and observations. Electronically signed by Timothy Gollan MD on 11/23/2020 at 6:44:57 PM.    Final    CT Angio Abd/Pel W and/or Wo Contrast  Result Date: 12/06/2020 CLINICAL DATA:  Abdominal pain and shortness of breath History of abdominal aortic aneurysm EXAM: CTA ABDOMEN AND PELVIS WITHOUT AND WITH CONTRAST TECHNIQUE: Multidetector CT imaging of the abdomen and pelvis was performed using the standard protocol during bolus administration of intravenous contrast. Multiplanar reconstructed images and MIPs were obtained and reviewed to evaluate the vascular anatomy. CONTRAST:  100mL OMNIPAQUE IOHEXOL 350 MG/ML  SOLN COMPARISON:  CT abdomen pelvis 08/01/2020 FINDINGS: VASCULAR Aorta: Diffuse atherosclerotic plaque of the abdominal aorta with aneurysm measuring up to 3.2 x 2.5 cm which is similar in size compared to prior examination where it measured 3.0 x 2.7 cm when measured in a similar manner. Celiac: Mild narrowing of the origin of the celiac artery. Evaluation of the branches of the celiac artery limited due to suboptimal timing of contrast bolus. SMA: No significant stenosis. Renals: Mild narrowing at the origin of the renal arteries. IMA: Patent. Inflow: The right common iliac artery is mildly prominent measuring up to 1.7 cm compared to 1.6 cm on the prior study. There is likely at least moderate stenosis at the origin of the right internal iliac artery. No significant stenosis of the right external iliac or common femoral arteries. The left common iliac artery is markedly tortuous with maximum diameter measuring 2.3 cm the overall diameter is not significantly changed since the prior examination. This is likely the site of focal dissection with partially thrombosed false lumen given appearance on prior CT. 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 No significant stenosis of the left external iliac, internal iliac or common femoral arteries. Proximal Outflow: No significant narrowing of visualized right proximal of flow. There is moderate to severe stenosis of the origin of the left superficial femoral artery. Veins: Acute thrombus noted within the right common femoral vein. Review of the MIP images confirms the above findings. NON-VASCULAR Lower chest: No acute abnormality. Hepatobiliary: No focal hepatic lesion. Mild thickening of the wall the gallbladder. Minimal perihepatic ascites. No bile duct dilatation. Portal vein is patent. Pancreas: Unremarkable. No pancreatic ductal dilatation or surrounding  inflammatory changes. Spleen: Normal in size without focal abnormality. Adrenals/Urinary Tract: Adrenal glands are normal. Bilateral simple renal cysts are again seen. No significant abnormality of the bladder or ureters. Stomach/Bowel: Distal colonic diverticulosis without evidence of acute diverticulitis. Distal colonic diverticulosis without evidence of acute diverticulitis. There has been interval development of omental caking with greatest concentration along the splenic flexure of the colon best seen on images 36-81 of series 4. Lymphatic: No enlarged abdominal or pelvic lymph nodes. Reproductive: Prostate is unremarkable. Other: Small amount of free fluid seen at the pelvic floor. Musculoskeletal: No acute or significant osseous findings. IMPRESSION: VASCULAR 1. Acute right common femoral deep venous thrombosis. 2. Unchanged abdominal aortic aneurysm measuring up to 3.2 x   2.5 cm. Recommend follow-up every 3 years. This recommendation follows ACR consensus guidelines: White Paper of the ACR Incidental Findings Committee II on Vascular Findings. J Am Coll Radiol 2013; 10:789-794. 3. 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 4. 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 5. Please note evaluation arterial vasculature is limited due to suboptimal contrast bolus timing. NON-VASCULAR 1. Mild gallbladder wall thickening is likely reactive. If the patient has symptoms of acute cholecystitis, further evaluation with right upper quadrant ultrasound should be performed. 2. Distal colonic diverticulosis without evidence of acute diverticulitis. 3. Interval development of left upper quadrant omental caking suspicious for progressive metastatic disease. These results were called by telephone at the time of interpretation on 12/06/2020 at 3:55 pm to provider ANTHONY ALLEN , who verbally acknowledged these results.  Electronically Signed   By: Farhaan  Mir M.D.   On: 12/06/2020 15:57     ASSESSMENT/PLAN:  This is a very pleasant 74-year-old Caucasian male with recurrent lung cancer, initially diagnosed with stage IIIb non-small cell lung cancer, adenocarcinoma.  The patient presented with a right upper lobe lung mass in addition to right hilar and mediastinal lymphadenopathy.  He was diagnosed in August 2021.  He does not have any actionable mutations. His PD-L1 expression is estimated to be in the range of 1 to 49%. He had evidence of disease recurrence in April 2022 with extensive pleural involvement, likely extending into extrapleural fat, in the RIGHT chest compatible with disease recurrence and associated with nodal disease in the mediastinum. 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 Equivocal uptake in the LEFT adrenal gland.   The patient declined enrollment in the alliance clinical trial for treatment with systemic chemotherapy plus or minus immunotherapy.   He underwent adjuvant systemic chemotherapy with cisplatin 75 mg/M2 and Alimta 500 mg/M2 every 3 weeks.  Status post 6 cycles.  He tolerated his treatment fairly well with no concerning adverse effects.  Unfortunately, the patient was found to have evidence of disease progression with progressive omental caking.  The patient was seen with Dr. [name] today.  Dr. [name] had a lengthy discussion with the patient today about his current condition and recommended treatment options.  Overall, the patient has a poor prognosis.  The patient is not interested in any further chemotherapy.  We will refer the patient to hospice.  Pain?  Remeron  We will not schedule any further appointments at this time but would be happy to see the patient on an as-needed basis.  The patient was advised to call immediately if he has any concerning symptoms in the interval. The patient voices understanding of current disease status and treatment  options and is in agreement with the current care plan. All questions were answered. The patient knows to call the clinic with any problems, questions or concerns. We can certainly see the patient much sooner if necessary        No orders of the defined types were placed in this encounter.    I spent {CHL ONC TIME VISIT - SWIFT:1154000130} counseling the patient face to face. The total time spent in the appointment was {CHL ONC TIME VISIT - SWIFT:1154000130}.  [name] [name], PA-C 12/17/20  

## 2020-12-17 NOTE — Telephone Encounter (Signed)
[facility]   PT for strengthen and gate training 1st week 1 time week  2nd week 2 time a week  3rd week  1 time a week  4th week 1 time a week  5th week 1 time a week  6th week 0 vist 7th week 1 time a week  8th 0 visit  9th week 1 time a week   Phone [phone] [facility]) Fax [phone]

## 2020-12-17 NOTE — Telephone Encounter (Signed)
Called and scheduled patient with Dr. Radford Pax on 12/1.

## 2020-12-19 ENCOUNTER — Encounter: Payer: Self-pay | Admitting: Family Medicine

## 2020-12-20 ENCOUNTER — Other Ambulatory Visit (HOSPITAL_COMMUNITY): Payer: Self-pay

## 2020-12-21 ENCOUNTER — Other Ambulatory Visit (HOSPITAL_COMMUNITY): Payer: Self-pay

## 2020-12-21 ENCOUNTER — Other Ambulatory Visit: Payer: Self-pay

## 2020-12-21 ENCOUNTER — Inpatient Hospital Stay: Payer: Medicare HMO | Attending: Internal Medicine

## 2020-12-21 ENCOUNTER — Ambulatory Visit (INDEPENDENT_AMBULATORY_CARE_PROVIDER_SITE_OTHER): Payer: Medicare HMO | Admitting: Family Medicine

## 2020-12-21 ENCOUNTER — Telehealth: Payer: Self-pay

## 2020-12-21 ENCOUNTER — Encounter: Payer: Self-pay | Admitting: Family Medicine

## 2020-12-21 ENCOUNTER — Other Ambulatory Visit: Payer: Self-pay | Admitting: Physician Assistant

## 2020-12-21 ENCOUNTER — Inpatient Hospital Stay: Payer: Medicare HMO

## 2020-12-21 ENCOUNTER — Inpatient Hospital Stay (HOSPITAL_BASED_OUTPATIENT_CLINIC_OR_DEPARTMENT_OTHER): Payer: Medicare HMO | Admitting: Internal Medicine

## 2020-12-21 VITALS — BP 110/72 | HR 94 | Temp 97.8°F | Resp 15 | Ht 70.0 in | Wt 158.6 lb

## 2020-12-21 VITALS — BP 132/89 | HR 95 | Temp 97.5°F | Resp 20 | Ht 70.0 in | Wt 159.2 lb

## 2020-12-21 DIAGNOSIS — R109 Unspecified abdominal pain: Secondary | ICD-10-CM | POA: Diagnosis not present

## 2020-12-21 DIAGNOSIS — K668 Other specified disorders of peritoneum: Secondary | ICD-10-CM | POA: Diagnosis not present

## 2020-12-21 DIAGNOSIS — R627 Adult failure to thrive: Secondary | ICD-10-CM

## 2020-12-21 DIAGNOSIS — Z95828 Presence of other vascular implants and grafts: Secondary | ICD-10-CM

## 2020-12-21 DIAGNOSIS — D500 Iron deficiency anemia secondary to blood loss (chronic): Secondary | ICD-10-CM | POA: Diagnosis not present

## 2020-12-21 DIAGNOSIS — C3490 Malignant neoplasm of unspecified part of unspecified bronchus or lung: Secondary | ICD-10-CM

## 2020-12-21 DIAGNOSIS — Z7189 Other specified counseling: Secondary | ICD-10-CM | POA: Diagnosis not present

## 2020-12-21 DIAGNOSIS — Z86718 Personal history of other venous thrombosis and embolism: Secondary | ICD-10-CM | POA: Diagnosis not present

## 2020-12-21 DIAGNOSIS — E860 Dehydration: Secondary | ICD-10-CM | POA: Diagnosis present

## 2020-12-21 DIAGNOSIS — C782 Secondary malignant neoplasm of pleura: Secondary | ICD-10-CM | POA: Diagnosis not present

## 2020-12-21 DIAGNOSIS — C3411 Malignant neoplasm of upper lobe, right bronchus or lung: Secondary | ICD-10-CM | POA: Insufficient documentation

## 2020-12-21 DIAGNOSIS — C3491 Malignant neoplasm of unspecified part of right bronchus or lung: Secondary | ICD-10-CM

## 2020-12-21 LAB — CBC WITH DIFFERENTIAL (CANCER CENTER ONLY)
Abs Immature Granulocytes: 0.09 10*3/uL — ABNORMAL HIGH (ref 0.00–0.07)
Basophils Absolute: 0 10*3/uL (ref 0.0–0.1)
Basophils Relative: 0 %
Eosinophils Absolute: 0 10*3/uL (ref 0.0–0.5)
Eosinophils Relative: 0 %
HCT: 30.1 % — ABNORMAL LOW (ref 39.0–52.0)
Hemoglobin: 10 g/dL — ABNORMAL LOW (ref 13.0–17.0)
Immature Granulocytes: 1 %
Lymphocytes Relative: 8 %
Lymphs Abs: 0.7 10*3/uL (ref 0.7–4.0)
MCH: 30.9 pg (ref 26.0–34.0)
MCHC: 33.2 g/dL (ref 30.0–36.0)
MCV: 92.9 fL (ref 80.0–100.0)
Monocytes Absolute: 0.9 10*3/uL (ref 0.1–1.0)
Monocytes Relative: 11 %
Neutro Abs: 6.7 10*3/uL (ref 1.7–7.7)
Neutrophils Relative %: 80 %
Platelet Count: 317 10*3/uL (ref 150–400)
RBC: 3.24 MIL/uL — ABNORMAL LOW (ref 4.22–5.81)
RDW: 19.8 % — ABNORMAL HIGH (ref 11.5–15.5)
WBC Count: 8.4 10*3/uL (ref 4.0–10.5)
nRBC: 0 % (ref 0.0–0.2)

## 2020-12-21 LAB — SAMPLE TO BLOOD BANK

## 2020-12-21 MED ORDER — HEPARIN SOD (PORK) LOCK FLUSH 100 UNIT/ML IV SOLN
500.0000 [IU] | Freq: Once | INTRAVENOUS | Status: AC
  Administered 2020-12-21: 500 [IU]

## 2020-12-21 MED ORDER — SODIUM CHLORIDE 0.9 % IV SOLN: Freq: Once | INTRAVENOUS | Status: AC

## 2020-12-21 MED ORDER — MEGESTROL ACETATE 400 MG/10ML PO SUSP
800.0000 mg | Freq: Every day | ORAL | 0 refills | Status: AC
  Filled 2020-12-21: qty 240, 12d supply, fill #0

## 2020-12-21 MED ORDER — MEGESTROL ACETATE 400 MG/10ML PO SUSP: 800.0000 mg | Freq: Every day | ORAL | 0 refills | Status: DC

## 2020-12-21 MED ORDER — SODIUM CHLORIDE 0.9% FLUSH
10.0000 mL | Freq: Once | INTRAVENOUS | Status: AC
Start: 2020-12-21 — End: 2020-12-21
  Administered 2020-12-21: 10 mL

## 2020-12-21 NOTE — Progress Notes (Signed)
   Subjective:    Patient ID: Marcus [name], male    DOB: Nov 10, 1945, 75 y.o.   MRN: [id number]  [facility] f/u- pt was admitted 8/29-9/4 w/ severe abd pain associated w/ nausea and diarrhea.  CT showed acute R common femoral DVT, gallbladder wall thickening, and LUQ omental caking suspicious for progressive metastatic disease.  HIDA scan was negative.  Plan was to tx pain supportively.  During his hospitalization he decided that he wanted to stop cancer tx.  Hgb at d/c was 7.4 and he got 1 unit PRBC prior to leaving.  No evidence of bleeding found.  Pt was given morphine for pain but he has not had to use this as pain has been better.  Home health has been out 3x since d/c.  Biggest concern is ongoing weakness.  Wife reports since coming home he is not eating or drinking.  He states he is not hungry.  Very little PO intake.  Pt has a port but has very poor peripheral access.   Review of Systems For ROS see HPI   This visit occurred during the SARS-CoV-2 public health emergency.  Safety protocols were in place, including screening questions prior to the visit, additional usage of staff PPE, and extensive cleaning of exam room while observing appropriate contact time as indicated for disinfecting solutions.      Objective:   Physical Exam Vitals reviewed.  Constitutional:      General: He is not in acute distress.    Appearance: He is ill-appearing.     Comments: Pale, lethargic  HENT:     Head: Normocephalic and atraumatic.  Cardiovascular:     Pulses: Normal pulses.  Pulmonary:     Effort: Pulmonary effort is normal.  Abdominal:     General: There is distension.     Tenderness: There is abdominal tenderness. There is no guarding or rebound.  Musculoskeletal:     Right lower leg: Edema present.     Left lower leg: Edema present.  Skin:    General: Skin is warm and dry.     Coloration: Skin is pale.  Neurological:     Mental Status: He is alert.  Psychiatric:     Comments: Flat  affect, withdrawn          Assessment & Plan:

## 2020-12-21 NOTE — Telephone Encounter (Signed)
Attempted to contact patient's wife [name] to schedule a Palliative Care consult appointment. No answer left a message to return call.

## 2020-12-21 NOTE — Patient Instructions (Addendum)
Dr Inda Merlin and Rubin Payor are both in agreement w/ labs today and appt if possible I sent in the Megace to improve his appetite At this time, I want him to be as comfortable as possible and I think we need Hospice to do that.  Cassie can talk to you about that this afternoon Call with any questions or concerns Hang in there!!!

## 2020-12-21 NOTE — Telephone Encounter (Signed)
Spoke with patient's wife Marcus [name] and scheduled an in-person Palliative Consult for 01/18/21 @ 9:30 AM  with Dr. Hollace Kinnier. Documentation will be noted in [place]. Marcus [name] is aware that PC visits will be combination of in person and virtual due to location.   COVID screening was negative. No pets in home. Patient lives with wife.  Consent obtained; updated Outlook/Netsmart/Team List and Epic.   Family is aware they may be receiving a call from provider the day before or day of to confirm appointment.

## 2020-12-21 NOTE — Assessment & Plan Note (Addendum)
After calling and speaking w/ Oncology, they were under the assumption pt had been d/c'd on Hospice.  In fact, he had been d/c'd w/ Home Health and Palliative care.  Per the most recent phone notes, [place] wants to do PT to build strength and balance.  He is not a candidate for this at this time.  Both pt and wife seem to be confused as to the next steps.  They are in agreement that he is not doing any additional treatment but they seem to think that bringing in Hospice is premature and want to know what the plan is going forward.  I encouraged comfort care as the plan but there seems to be a disconnect as to what that means.  Mentioned this to oncology when I called.  Total time spent w/ pt and wife>45 minutes- providing counseling, coordinating oncology care, reviewing recent hospitalization and discussing goals.

## 2020-12-21 NOTE — Telephone Encounter (Signed)
I'm not sure pt is appropriate for home health PT as his care team is recommending Hospice

## 2020-12-21 NOTE — Assessment & Plan Note (Signed)
Pt is not eating or drinking since being d/c'd from the hospital.  He has no appetite.  + abdominal pain.  Is very weak and lethargic.  Suspect his electrolytes are off but he doesn't have peripheral access and we can't access his port.  He will need to go to the [facility] to have labs drawn.  Wife and pt want medication to stimulate his appetite.  I told them I can prescribe Megace but in the setting of terminal cancer, I don't know what benefit this will provide.  Prescription sent.

## 2020-12-21 NOTE — Assessment & Plan Note (Signed)
Pt's Hgb was 7.4 when he was d/c'd on 9/4.  He did get 1 unit prior to d/c but no f/u labs were drawn.  Again, I cannot access his port here in the office and due to dehydration he doesn't have peripheral access.  Arranged to have CBC done at [facility].

## 2020-12-21 NOTE — Telephone Encounter (Signed)
Needed rx sent to [place] outpt instead. Updated pharmacy per patient request

## 2020-12-21 NOTE — Telephone Encounter (Signed)
I informed Marcus [name], she said that she would reach out to the rest of his care team as well

## 2020-12-21 NOTE — Progress Notes (Signed)
[place] [phone]   [phone]  OFFICE PROGRESS NOTE  [name], MD [address] [place]  [id number]  DIAGNOSIS: Recurrent non-small cell lung cancer, adenocarcinoma initially diagnosed as stage IIIB (T3, N2, M0) non-small cell lung cancer, adenocarcinoma presented with right upper lobe lung mass in addition to right hilar and mediastinal lymphadenopathy. Diagnosed in December 08, 2019. In April 2022, he had signs of disease recurrence with extensive pleural involvement, likely extending into extrapleural fat, in the RIGHT chest compatible with disease recurrence and associated with nodal disease in the mediastinum. 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 Equivocal uptake in the LEFT adrenal gland.    Molecular Studies: Negative for actionable mutations   PDL1: 1-49%   PRIOR THERAPY:  1) Right upper lobectomy with lymph node sampling under the care of Dr. Kipp Brood on December 08, 2019. 2) Adjuvant systemic chemotherapy with Cisplatin 75 mg/m2 and Alimta 500 mg/m2 IV every 3 weeks. Last dose on 04/13/20.  Status post 4 cycles. 3) Palliative systemic chemotherapy with carboplatin for an AUC of 5, Alimta, 500 mg/m2, and Keytruda 200 mg/m2. First dose expected on 09/01/20.  Status post 5 cycles.  Starting from cycle #5 the patient will be on maintenance treatment with Alimta and Keytruda every 3 weeks.  This treatment was discontinued secondary to disease progression.   CURRENT THERAPY: Palliative and hospice care.  INTERVAL HISTORY: JACOBS GOLAB 75 y.o. male returns to the clinic today for follow-up visit accompanied by his wife.  The patient continues to have increasing fatigue and weakness as well as lack of appetite and weight loss.  He has abdominal distention as well as abdominal pain but no significant nausea, vomiting, diarrhea or constipation.  He was admitted to the hospital recently twice  for the venous thrombosis.  He was on treatment with Coumadin because of his mechanical valve and his treatment was switched to Lovenox subcutaneously.  He is tolerating this treatment well.  During his evaluation he had CT angiogram of the chest that showed no evidence for pulmonary embolism and no evidence for disease progression in the chest but CT of the abdomen pelvis showed omental caking suspicious for metastatic disease progression in the abdomen.  The patient is here today for evaluation and recommendation regarding his condition.  MEDICAL HISTORY: Past Medical History:  Diagnosis Date   Atrial fibrillation ([facility])    DVT (deep venous thrombosis) ([facility])    Dyspnea    with exertion    Dysrhythmia    Hypertension    Obstructive sleep apnea 10/25/2007   cpap   S/P AVR    2D ECHO, 04/25/2011 - EF >55%, Right ventricle-mild-moderately dilated   Swelling of limb    LEA VENOUS, 08/22/2009 - no evidence of deep vein or superficial thrombosis; partially rupturing Baker's Cyst    ALLERGIES:  is allergic to amlodipine, sulfa antibiotics, tetanus toxoids, crestor [rosuvastatin], and penicillins.  MEDICATIONS:  Current Outpatient Medications  Medication Sig Dispense Refill   acetaminophen (TYLENOL) 500 MG tablet Take 500 mg by mouth every 6 (six) hours as needed for mild pain or headache.     ALPRAZolam (XANAX) 0.25 MG tablet TAKE 1 TABLET (0.25 MG TOTAL) BY MOUTH EVERY 8 (EIGHT) HOURS AS NEEDED FOR UP TO 20 DOSES FOR ANXIETY. 20 tablet 0   cetirizine (ZYRTEC) 10 MG tablet Take 10 mg by mouth at bedtime.     Coenzyme Q10-Vitamin E (  QUNOL ULTRA COQ10 PO) Take 1 capsule by mouth daily with breakfast.     enoxaparin (LOVENOX) 80 MG/0.8ML injection Inject 0.8 mLs (80 mg total) into the skin 2 (two) times daily. 48 mL 2   folic acid (FOLVITE) 1 MG tablet TAKE 1 TABLET BY MOUTH EVERY DAY (Patient taking differently: Take 1 mg by mouth daily with breakfast.) 30 tablet 2   furosemide (LASIX) 20 MG  tablet Take 20 mg by mouth as needed for edema.     lidocaine-prilocaine (EMLA) cream Apply 1 application topically as needed. (Patient taking differently: Apply 1 application topically as needed (as directed).) 30 g 2   megestrol (MEGACE) 400 MG/10ML suspension Take 20 mLs (800 mg total) by mouth daily. 240 mL 0   metoprolol tartrate (LOPRESSOR) 25 MG tablet Take 1 tablet (25 mg total) by mouth 2 (two) times daily. 60 tablet 1   Morphine Sulfate (MORPHINE CONCENTRATE) 10 MG/0.5ML SOLN concentrated solution Take 0.5 mLs (10 mg total) by mouth every 4 (four) hours as needed for moderate pain. 180 mL 0   Multiple Vitamin (ONE-A-DAY 55 PLUS PO) Take 1 tablet by mouth daily with breakfast.     Multiple Vitamins-Minerals (PRESERVISION AREDS 2) CAPS Take 1 capsule by mouth daily with breakfast.     polyethylene glycol (MIRALAX / GLYCOLAX) 17 g packet Take 17 g by mouth daily as needed for mild constipation. 14 each 0   prochlorperazine (COMPAZINE) 10 MG tablet Take 1 tablet (10 mg total) by mouth every 6 (six) hours as needed. (Patient taking differently: Take 10 mg by mouth every 6 (six) hours as needed for nausea or vomiting.) 30 tablet 2   No current facility-administered medications for this visit.    SURGICAL HISTORY:  Past Surgical History:  Procedure Laterality Date   CARDIAC CATHETERIZATION Bilateral 05/10/2007   Significant 1-vessel disease, severely dilated aortic root with moderate severe aortic insufficiency   CARDIAC SURGERY     CARDIOVERSION  08/02/2007   150 Joule biphasic shock with restoration of sinus rhythm. Heart rate 60.   cataract surgery      COLONOSCOPY WITH PROPOFOL N/A 12/15/2016   Procedure: COLONOSCOPY WITH PROPOFOL;  Surgeon: Carol Ada, MD;  Location: [facility];  Service: Endoscopy;  Laterality: N/A;   EYE SURGERY     INTERCOSTAL NERVE BLOCK Right 12/08/2019   Procedure: INTERCOSTAL NERVE BLOCK;  Surgeon: [name], MD;  Location: [facility];  Service:  Thoracic;  Laterality: Right;   IR IMAGING GUIDED PORT INSERTION  09/28/2020   IR THORACENTESIS ASP PLEURAL SPACE W/IMG GUIDE  08/05/2020   NM MYOVIEW LTD  04/08/2007   No evidence of inducible myocardial ischemia   NODE DISSECTION Right 12/08/2019   Procedure: NODE DISSECTION;  Surgeon: [name], MD;  Location: [facility];  Service: Thoracic;  Laterality: Right;   THYROIDECTOMY, PARTIAL     torn meniscus in right knee surgery       REVIEW OF SYSTEMS:  Constitutional: positive for anorexia, fatigue, and weight loss Eyes: negative Ears, nose, mouth, throat, and face: negative Respiratory: positive for cough Cardiovascular: negative Gastrointestinal: positive for abdominal pain and abdominal distention Genitourinary:negative Integument/breast: negative Hematologic/lymphatic: negative Musculoskeletal:positive for muscle weakness Neurological: negative Behavioral/Psych: negative Endocrine: negative Allergic/Immunologic: negative   PHYSICAL EXAMINATION: General appearance: alert, cooperative, fatigued, and no distress Head: Normocephalic, without obvious abnormality, atraumatic Neck: no adenopathy, no JVD, supple, symmetrical, trachea midline, and thyroid not enlarged, symmetric, no tenderness/mass/nodules Lymph nodes: Cervical, supraclavicular, and axillary nodes normal. Resp:  clear to auscultation bilaterally Back: symmetric, no curvature. ROM normal. No CVA tenderness. Cardio: regular rate and rhythm, S1, S2 normal, no murmur, click, rub or gallop GI: soft, non-tender; bowel sounds normal; no masses,  no organomegaly Extremities: extremities normal, atraumatic, no cyanosis or edema Neurologic: Alert and oriented X 3, normal strength and tone. Normal symmetric reflexes. Normal coordination and gait  ECOG PERFORMANCE STATUS: 1 - Symptomatic but completely ambulatory  Blood pressure 132/89, pulse 95, temperature (!) 97.5 F (36.4 C), temperature source Oral, resp. rate 20,  height 5' 10"  (1.778 m), weight 159 lb 3.2 oz (72.2 kg), SpO2 96 %.  LABORATORY DATA: Lab Results  Component Value Date   WBC 3.4 (L) 12/12/2020   HGB 7.4 (L) 12/12/2020   HCT 22.8 (L) 12/12/2020   MCV 93.1 12/12/2020   PLT 82 (L) 12/12/2020      Chemistry      Component Value Date/Time   NA 134 (L) 12/11/2020 1202   NA 139 10/28/2019 1057   K 3.5 12/11/2020 1202   CL 102 12/11/2020 1202   CO2 25 12/11/2020 1202   BUN 18 12/11/2020 1202   BUN 10 10/28/2019 1057   CREATININE 0.72 12/11/2020 1202   CREATININE 0.79 12/01/2020 0856      Component Value Date/Time   CALCIUM 7.4 (L) 12/11/2020 1202   ALKPHOS 152 (H) 12/09/2020 0335   AST 53 (H) 12/09/2020 0335   AST 67 (H) 12/01/2020 0856   ALT 89 (H) 12/09/2020 0335   ALT 50 (H) 12/01/2020 0856   BILITOT 0.7 12/09/2020 0335   BILITOT 0.8 12/01/2020 0856       RADIOGRAPHIC STUDIES: DG Chest 2 View  Result Date: 12/06/2020 CLINICAL DATA:  Chest pain. EXAM: CHEST - 2 VIEW COMPARISON:  November 23, 2020. FINDINGS: The heart size and mediastinal contours are within normal limits. Status post cardiac valve repair. Right internal jugular Port-A-Cath is unchanged in position. No pneumothorax is noted. Left lung is clear. Stable probable scarring is noted in the right lung base. The visualized skeletal structures are unremarkable. IMPRESSION: Stable right lung scarring.  No acute abnormality seen. Electronically Signed   By: Marijo Conception M.D.   On: 12/06/2020 11:38   CT Angio Chest PE W/Cm &/Or Wo Cm  Result Date: 12/06/2020 CLINICAL DATA:  Concern for pulmonary embolism. History of lung cancer with chemotherapy ongoing. EXAM: CT ANGIOGRAPHY CHEST WITH CONTRAST TECHNIQUE: Multidetector CT imaging of the chest was performed using the standard protocol during bolus administration of intravenous contrast. Multiplanar CT image reconstructions and MIPs were obtained to evaluate the vascular anatomy. CONTRAST:  168m OMNIPAQUE IOHEXOL 350  MG/ML SOLN COMPARISON:  CT chest 11/01/2020 FINDINGS: Cardiovascular: No filling defects within the pulmonary arteries to suggest acute pulmonary embolism. Post CABG anatomy. Mediastinum/Nodes: Port in the anterior chest wall with tip in distal SVC. No axillary or supraclavicular adenopathy. No mediastinal or hilar adenopathy. No pericardial fluid. Esophagus normal. Lungs/Pleura: Volume loss in the RIGHT hemithorax. Suture line in the upper lobe. No suspicious nodularity. Thickening along the fissure is similar prior. LEFT lung is mildly hyperinflated. No acute findings. Upper Abdomen: Limited view of the liver, kidneys, pancreas are unremarkable. Normal adrenal glands. Musculoskeletal: No aggressive osseous lesion. Review of the MIP images confirms the above findings. IMPRESSION: 1. No evidence acute pulmonary embolism. 2. No pulmonary infarction or pneumonia. 3. Postsurgical change in the RIGHT hemithorax without evidence local lung cancer recurrence. Electronically Signed   By: SHelane GuntherD.  On: 12/06/2020 15:09   NM Hepatobiliary Liver Func  Result Date: 12/07/2020 CLINICAL DATA:  Biliary colic, recurrent, gallbladder dyskinesia suspected EXAM: NUCLEAR MEDICINE HEPATOBILIARY IMAGING TECHNIQUE: Sequential images of the abdomen were obtained out to 60 minutes following intravenous administration of radiopharmaceutical. RADIOPHARMACEUTICALS:  5.5 mCi Tc-42m Choletec IV COMPARISON:  12/06/2020 FINDINGS: Prompt uptake and biliary excretion of activity by the liver is seen. Gallbladder activity is visualized, consistent with patency of cystic duct. Biliary activity passes into small bowel, consistent with patent common bile duct. IMPRESSION: Normal exam.  Patency of the cystic duct and common bile duct. Electronically Signed   By: NDavina PokeD.O.   On: 12/07/2020 10:01   [place]Abdomen Limited  Result Date: 12/06/2020 CLINICAL DATA:  Right upper quadrant pain EXAM: ULTRASOUND ABDOMEN LIMITED RIGHT  UPPER QUADRANT COMPARISON:  12/06/2020 FINDINGS: Gallbladder: Gallbladder is well distended with evidence of gallbladder sludge. No wall thickening or pericholecystic fluid is noted. Common bile duct: Diameter: Prominent up to 1.2 cm with distal normal tapering. No intrahepatic ductal dilatation is noted. Liver: No focal lesion identified. Within normal limits in parenchymal echogenicity. Portal vein is patent on color Doppler imaging with normal direction of blood flow towards the liver. Other: None. IMPRESSION: Mild gallbladder sludge. Mildly prominent common bile duct centrally with normal distal tapering similar to that seen on prior CT. No intrahepatic ductal dilatation is noted. Electronically Signed   By: MInez CatalinaM.D.   On: 12/06/2020 16:52   DG Chest Port 1 View  Result Date: 11/23/2020 CLINICAL DATA:  History of lung cancer. EXAM: PORTABLE CHEST 1 VIEW COMPARISON:  Chest x-ray 09/07/2020 FINDINGS: The cardiac silhouette, mediastinal and hilar contours are within normal limits and stable. Stable surgical changes from cardiac surgery. Right IJ power port in good position without complicating features. Chronic pulmonary scarring changes.  Chronic right-sided pleural-. IMPRESSION: Chronic lung changes but no acute pulmonary findings. Electronically Signed   By: PMarijo SanesM.D.   On: 11/23/2020 17:24   VAS [place]OWER EXTREMITY VENOUS (DVT)  Result Date: 11/23/2020  Lower Venous DVT Study Patient Name:  DHARIM BIHILL  Date of Exam:   11/23/2020 Medical Rec #: [id number]     Accession #:    [id number] of Birth: [birth year]      Patient Gender: M Patient Age:   89years Exam Location:  [facility] Procedure:      VAS [place]OWER EXTREMITY VENOUS (DVT) Referring Phys: MHosp Hermanos MelendezCROITORU --------------------------------------------------------------------------------  Indications: Patient presents today with complaints of acute onset of right lower extremity swelling x 3 days, and right groin pain x 1 day. He  denies any symptoms involving the left lower extremity. He denies any SOB.  Risk Factors: Cancer of the lung and currently doing chemotherapy since August 2021. Anticoagulation: Coumadin.  Performing Technologist: KSharlett IlesRVT  Examination Guidelines: A complete evaluation includes B-mode imaging, spectral Doppler, color Doppler, and power Doppler as needed of all accessible portions of each vessel. Bilateral testing is considered an integral part of a complete examination. Limited examinations for reoccurring indications may be performed as noted. The reflux portion of the exam is performed with the patient in reverse Trendelenburg.  +---------+---------------+---------+-----------+---------------+--------------+ RIGHT    CompressibilityPhasicitySpontaneityProperties     Thrombus Aging +---------+---------------+---------+-----------+---------------+--------------+ CFV      None           No       No         softly  Age                                                        echogenic and  Indeterminate                                              brightly                                                                  echogenic                     +---------+---------------+---------+-----------+---------------+--------------+ SFJ      None           No       No         softly         Acute                                                      echogenic                     +---------+---------------+---------+-----------+---------------+--------------+ FV Prox  None           No       No         softly         Age                                                        echogenic and  Indeterminate                                              brightly                      +---------+---------------+---------+-----------+---------------+--------------+ FV Mid   None           No       No         softly         Acute                                                       echogenic                     +---------+---------------+---------+-----------+---------------+--------------+ FV DistalNone  No       No         softly         Acute                                                      echogenic                     +---------+---------------+---------+-----------+---------------+--------------+ PFV      None           No       No         softly         Age                                                        echogenic and  Indeterminate                                              brightly                                                                  echogenic                     +---------+---------------+---------+-----------+---------------+--------------+ POP      None           No       No         softly         Acute                                                      echogenic                     +---------+---------------+---------+-----------+---------------+--------------+ PTV      None           No       No         softly         Acute                                                      echogenic                     +---------+---------------+---------+-----------+---------------+--------------+ PERO     None           No       No  softly         Acute                                                      echogenic                     +---------+---------------+---------+-----------+---------------+--------------+ Soleal   None           No       No         softly         Acute                                                      echogenic                     +---------+---------------+---------+-----------+---------------+--------------+ Gastroc  None           No       No         softly         Acute                                                      echogenic                      +---------+---------------+---------+-----------+---------------+--------------+ GSV      None           No       No         softly         Acute                                                      echogenic                     +---------+---------------+---------+-----------+---------------+--------------+ SSV      None           No       No         softly         Age                                                        echogenic      Indeterminate  +---------+---------------+---------+-----------+---------------+--------------+ SPJ      None           No       No         softly         Acute  echogenic                     +---------+---------------+---------+-----------+---------------+--------------+ TPT      None           No       No         softly         Acute                                                      echogenic                     +---------+---------------+---------+-----------+---------------+--------------+   Right Technical Findings: Acute non-occlusive thrombus in the distal external iliac vein. Acute occlusive thrombus noted in the SFJ, GSV, proximal to distal FV, popliteal vein, gastrocnemius vein, TPT, PTV, popliteal vein and SPJ. Age Indeterminate occlusive thrombus noted in the  CFV, ostial FV, DFV and proximal SSV.  +---------+---------------+---------+-----------+---------------+--------------+ LEFT     CompressibilityPhasicitySpontaneityProperties     Thrombus Aging +---------+---------------+---------+-----------+---------------+--------------+ CFV      Full           Yes      Yes                                      +---------+---------------+---------+-----------+---------------+--------------+ SFJ      Full           Yes      Yes                                      +---------+---------------+---------+-----------+---------------+--------------+ FV  Prox  Full           Yes      Yes                                      +---------+---------------+---------+-----------+---------------+--------------+ FV Mid   Full           Yes      Yes                                      +---------+---------------+---------+-----------+---------------+--------------+ FV DistalFull           Yes      Yes                                      +---------+---------------+---------+-----------+---------------+--------------+ PFV      None           No       No         softly         Acute  echogenic                     +---------+---------------+---------+-----------+---------------+--------------+ POP      Full           Yes      Yes                                      +---------+---------------+---------+-----------+---------------+--------------+ PTV      None           No       No         softly         Acute                                                      echogenic                     +---------+---------------+---------+-----------+---------------+--------------+ PERO     None           No       No         softly         Acute                                                      echogenic                     +---------+---------------+---------+-----------+---------------+--------------+ Soleal   None           No       No         softly         Acute                                                      echogenic                     +---------+---------------+---------+-----------+---------------+--------------+ Gastroc  Full           Yes      Yes                                      +---------+---------------+---------+-----------+---------------+--------------+ GSV      None           No       No         softly         Acute                                                      echogenic                      +---------+---------------+---------+-----------+---------------+--------------+  SSV      None           No       No         softly         Acute                                                      echogenic                     +---------+---------------+---------+-----------+---------------+--------------+ TPT      None           No       No         softly         Acute                                                      echogenic                     +---------+---------------+---------+-----------+---------------+--------------+  Left Technical Findings: Acute occlusive thrombus in the proximal GSV, not invading the SFJ (.37 cm from junction). Acute occlusive thrombus in the DFV, TPT, PTV, peroneal veins, SSV and soleal vein.  Findings reported to Dr. Gardiner Rhyme at 2:00 pm.  Summary: RIGHT: - Findings consistent with acute deep vein thrombosis involving the SF junction, right femoral vein, right popliteal vein, right posterior tibial veins, right peroneal veins, right soleal veins, right gastrocnemius veins, and TPT and SPJ. - Findings consistent with acute superficial vein thrombosis involving the right great saphenous vein. - Findings consistent with age indeterminate deep vein thrombosis involving the right common femoral vein, right femoral vein, and right proximal profunda vein. - Findings consistent with age indeterminate superficial vein thrombosis involving the right small saphenous vein. - No cystic structure found in the popliteal fossa. - Findings consistent with acute deep vein thrombosis involving the distal external iliac vein.  LEFT: - Findings consistent with acute deep vein thrombosis involving the left proximal profunda vein, left posterior tibial veins, left peroneal veins, left soleal veins, and TPT. - Findings consistent with acute superficial vein thrombosis involving the left great saphenous vein, and left small saphenous vein. - No cystic structure found in the  popliteal fossa.  Incidental findings: Abnormal dilatation of the proximal abdominal aorta, measuring 3.2 cm AP x 3.2 cm TRV. Suggest follow-up for AAA duplex. *See table(s) above for measurements and observations. Electronically signed by Ida Rogue MD on 11/23/2020 at 6:44:57 PM.    Final    CT Angio Abd/Pel W and/or Wo Contrast  Result Date: 12/06/2020 CLINICAL DATA:  Abdominal pain and shortness of breath History of abdominal aortic aneurysm EXAM: CTA ABDOMEN AND PELVIS WITHOUT AND WITH CONTRAST TECHNIQUE: Multidetector CT imaging of the abdomen and pelvis was performed using the standard protocol during bolus administration of intravenous contrast. Multiplanar reconstructed images and MIPs were obtained and reviewed to evaluate the vascular anatomy. CONTRAST:  16m OMNIPAQUE IOHEXOL 350 MG/ML SOLN COMPARISON:  CT abdomen pelvis 08/01/2020 FINDINGS: VASCULAR Aorta: Diffuse atherosclerotic plaque of the abdominal aorta with aneurysm measuring up to 3.2 x 2.5 cm which is  similar in size compared to prior examination where it measured 3.0 x 2.7 cm when measured in a similar manner. Celiac: Mild narrowing of the origin of the celiac artery. Evaluation of the branches of the celiac artery limited due to suboptimal timing of contrast bolus. SMA: No significant stenosis. Renals: Mild narrowing at the origin of the renal arteries. IMA: Patent. Inflow: The right common iliac artery is mildly prominent measuring up to 1.7 cm compared to 1.6 cm on the prior study. There is likely at least moderate stenosis at the origin of the right internal iliac artery. No significant stenosis of the right external iliac or common femoral arteries. The left common iliac artery is markedly tortuous with maximum diameter measuring 2.3 cm the overall diameter is not significantly changed since the prior examination. This is likely the site of focal dissection with partially thrombosed false lumen given appearance on prior CT.  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 No significant stenosis of the left external iliac, internal iliac or common femoral arteries. Proximal Outflow: No significant narrowing of visualized right proximal of flow. There is moderate to severe stenosis of the origin of the left superficial femoral artery. Veins: Acute thrombus noted within the right common femoral vein. Review of the MIP images confirms the above findings. NON-VASCULAR Lower chest: No acute abnormality. Hepatobiliary: No focal hepatic lesion. Mild thickening of the wall the gallbladder. Minimal perihepatic ascites. No bile duct dilatation. Portal vein is patent. Pancreas: Unremarkable. No pancreatic ductal dilatation or surrounding inflammatory changes. Spleen: Normal in size without focal abnormality. Adrenals/Urinary Tract: Adrenal glands are normal. Bilateral simple renal cysts are again seen. No significant abnormality of the bladder or ureters. Stomach/Bowel: Distal colonic diverticulosis without evidence of acute diverticulitis. Distal colonic diverticulosis without evidence of acute diverticulitis. There has been interval development of omental caking with greatest concentration along the splenic flexure of the colon best seen on images 36-81 of series 4. Lymphatic: No enlarged abdominal or pelvic lymph nodes. Reproductive: Prostate is unremarkable. Other: Small amount of free fluid seen at the pelvic floor. Musculoskeletal: No acute or significant osseous findings. IMPRESSION: VASCULAR 1. Acute right common femoral deep venous thrombosis. 2. Unchanged abdominal aortic aneurysm measuring up to 3.2 x 2.5 cm. Recommend follow-up every 3 years. This recommendation follows ACR consensus guidelines: White Paper of the ACR Incidental Findings Committee II on Vascular Findings. J Am Coll Radiol 2013; 10:789-794. 3. 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 4. 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 5. Please note evaluation arterial vasculature is limited due to suboptimal contrast bolus timing. NON-VASCULAR 1. Mild gallbladder wall thickening is likely reactive. If the patient has symptoms of acute cholecystitis, further evaluation with right upper quadrant ultrasound should be performed. 2. Distal colonic diverticulosis without evidence of acute diverticulitis. 3. Interval development of left upper quadrant omental caking suspicious for progressive metastatic disease. These results were called by telephone at the time of interpretation on 12/06/2020 at 3:55 pm to provider Lacretia Leigh , who verbally acknowledged these results. Electronically Signed   By: Miachel Roux M.D.   On: 12/06/2020 15:57    ASSESSMENT AND PLAN: This is a very pleasant 75 years old white male with metastatic non-small cell lung cancer initially diagnosed as stage IIIb non-small cell lung cancer, adenocarcinoma  presented with right upper lobe lung mass in addition to right hilar and mediastinal lymphadenopathy diagnosed in August 2021 with negative actionable mutation and PD-L1 expression in the range of 1-49%. The patient declined enrollment in the alliance clinical trial for treatment with systemic chemotherapy plus/minus immunotherapy. He underwent adjuvant systemic chemotherapy with cisplatin 75 mg/M2 and Alimta 500 mg/M2 every 3 weeks.  Status post 4 cycles.  He tolerated his treatment fairly well with no concerning adverse effects. The patient had evidence for disease recurrence in March 2022 with pleural-based metastasis that was biopsy-proven to be recurrent adenocarcinoma of the lung. He had molecular studies by [employer] that showed no actionable mutations. The patient underwent systemic chemotherapy with carboplatin for AUC of 5, Alimta 500  Mg/M2 and Keytruda 200 Mg IV every 3 weeks status post 5 cycles.  Unfortunately recent CT scan of the abdomen pelvis showed evidence for disease progression in the abdomen with omental caking suspicious for worsening disease in the abdomen.  He has no evidence for disease progression in the chest. I had a lengthy discussion with the patient and his wife about his current condition and treatment options.  I discussed with them treatment with second line chemotherapy but this will be tough on the patient especially with his current status.  The patient and his wife also mentions that they are not interested in any additional chemotherapy at this point. I discussed with him the option of palliative care and hospice referral and they are in agreement with this plan. For the dehydration and lack of appetite, will arrange for the patient to receive 1 L of normal saline in the clinic today. For the recent history of deep venous thrombosis as well as a history of mechanical valve, the patient will continue his current treatment with Lovenox. He was advised to call immediately if he has any other concerning symptoms in the interval. The patient voices understanding of current disease status and treatment options and is in agreement with the current care plan.  All questions were answered. The patient knows to call the clinic with any problems, questions or concerns. We can certainly see the patient much sooner if necessary.  The total time spent in the appointment was 40 minutes.  Disclaimer: This note was dictated with voice recognition software. Similar sounding words can inadvertently be transcribed and may not be corrected upon review.

## 2020-12-21 NOTE — Assessment & Plan Note (Signed)
Ongoing issue for pt.  Has diffuse metastatic cancer and omental caking.  He has morphine available but is not taking it.  Encouraged him to use the pain meds as needed.

## 2020-12-22 ENCOUNTER — Other Ambulatory Visit: Payer: Self-pay

## 2020-12-22 ENCOUNTER — Telehealth: Payer: Self-pay

## 2020-12-22 ENCOUNTER — Telehealth: Payer: Self-pay | Admitting: Family Medicine

## 2020-12-22 DIAGNOSIS — C3490 Malignant neoplasm of unspecified part of unspecified bronchus or lung: Secondary | ICD-10-CM

## 2020-12-22 NOTE — Telephone Encounter (Signed)
I have confirmed with pts wife that their address is in [place], [place]. I have therefore called [facility] at [phone] and spoke with Caryl Pina who advised to fax this referral to [phone]. Referral, labs, demographics, insurance and recent labs have all been faxed.

## 2020-12-22 NOTE — Telephone Encounter (Signed)
Mrs. Banfill called stated pt stopped chemo, given 3 months to live, he is on lovenox still. She stated that they had received a call wanting to schedule a scan of his heart. They are wanting a callback.

## 2020-12-22 NOTE — Telephone Encounter (Signed)
Spoke with the patient's wife. She has been made aware that the test did not need to be completed at this time. She has been offered our condolences and to please keep [place] updated.

## 2020-12-22 NOTE — Telephone Encounter (Signed)
I have spoken with Horris Latino w/[facility] to make this referral. All requested information has been provided.

## 2020-12-22 NOTE — Telephone Encounter (Signed)
This order should come from oncology as they made the referral

## 2020-12-22 NOTE — Telephone Encounter (Signed)
..  Home Health Verbal Orders  Agency:  [facility]  Caller: (Contact and title)Toni - Clinical Manager Call back #: [phone]    Requesting OT/ PT/ Skilled nursing/ Social Work/ Speech:  Hospice Order  Reason for Request:  Patient is requesting   Frequency:     HH needs F2F w/in last 30 days

## 2020-12-22 NOTE — Telephone Encounter (Signed)
Marcus [name] w/[facility] has called back to advise they cannot provide hospice care for this pt as it is out of their service area.

## 2020-12-23 ENCOUNTER — Ambulatory Visit: Payer: Medicare HMO | Admitting: Physician Assistant

## 2020-12-23 ENCOUNTER — Telehealth: Payer: Self-pay

## 2020-12-23 ENCOUNTER — Ambulatory Visit: Payer: Medicare HMO

## 2020-12-23 ENCOUNTER — Other Ambulatory Visit: Payer: Medicare HMO

## 2020-12-23 NOTE — Telephone Encounter (Signed)
Left vm for Vivien Rota informing her. Invited her to call back if she needed anything else

## 2020-12-23 NOTE — Telephone Encounter (Signed)
Called and spoke with patient wife about concerns. Wife stated that when patient was discharged that they were told that he needed in home health service then. She also stated that you were already familiar with this and that you were on board. Patient needs a referral to hospice. Ok to place?

## 2020-12-23 NOTE — Telephone Encounter (Signed)
Received a call from [place] with [facility], states they are wondering if they can get a referral to hospice.

## 2020-12-24 ENCOUNTER — Telehealth: Payer: Self-pay | Admitting: Medical Oncology

## 2020-12-24 NOTE — Telephone Encounter (Signed)
Per Octavia Bruckner, pt requested referral to [facility]. Referral sent.

## 2020-12-24 NOTE — Telephone Encounter (Signed)
The referral to Hospice should come from his Oncology team

## 2020-12-24 NOTE — Telephone Encounter (Signed)
Called and informed patient and wife that  per provider the referral has been placed as of 12/22/20 by his oncology team, Dr Inda Merlin. Wife understood. No further concerns at this time.

## 2020-12-24 NOTE — Telephone Encounter (Signed)
And upon chart review, was placed on 9/14 by Dr Inda Merlin

## 2020-12-27 ENCOUNTER — Inpatient Hospital Stay: Payer: Medicare HMO | Admitting: Family Medicine

## 2021-01-03 ENCOUNTER — Encounter: Payer: Self-pay | Admitting: Family Medicine

## 2021-01-03 ENCOUNTER — Encounter: Payer: Self-pay | Admitting: Internal Medicine

## 2021-01-04 NOTE — Telephone Encounter (Signed)
See message from Pharm D.

## 2021-01-08 DEATH — deceased

## 2021-01-13 ENCOUNTER — Other Ambulatory Visit: Payer: Medicare HMO

## 2021-01-13 ENCOUNTER — Ambulatory Visit: Payer: Medicare HMO

## 2021-01-13 ENCOUNTER — Ambulatory Visit: Payer: Medicare HMO | Admitting: Internal Medicine

## 2021-02-01 ENCOUNTER — Telehealth: Payer: Self-pay | Admitting: Medical Oncology

## 2021-02-01 NOTE — Telephone Encounter (Signed)
Wife requests records from 08/16 admission and discharge summary and same for 08/29 and 09/4. Emailed to wife.

## 2021-02-04 ENCOUNTER — Other Ambulatory Visit (HOSPITAL_COMMUNITY): Payer: Self-pay

## 2021-02-15 ENCOUNTER — Telehealth: Payer: Self-pay | Admitting: Medical Oncology

## 2021-02-15 NOTE — Telephone Encounter (Signed)
Cancer Policy information -Assisted wife with obtaining dx code and diagnostic testing.

## 2021-02-24 ENCOUNTER — Telehealth: Payer: Self-pay

## 2021-02-24 NOTE — Telephone Encounter (Signed)
Home Health Form placed in [place] bin with Charge Form

## 2021-03-07 NOTE — Telephone Encounter (Signed)
Form completed and placed in basket  

## 2021-03-10 ENCOUNTER — Telehealth: Payer: Self-pay | Admitting: Medical Oncology

## 2021-03-10 ENCOUNTER — Encounter: Payer: Self-pay | Admitting: Medical Oncology

## 2021-03-10 ENCOUNTER — Ambulatory Visit: Payer: Medicare HMO | Admitting: Cardiology

## 2021-03-10 NOTE — Telephone Encounter (Signed)
I requested a call back .

## 2021-03-15 ENCOUNTER — Encounter: Payer: Self-pay | Admitting: Medical Oncology

## 2021-03-15 ENCOUNTER — Telehealth: Payer: Self-pay | Admitting: Medical Oncology

## 2021-03-15 NOTE — Telephone Encounter (Signed)
Letter for [facility] for [employer] is pending MD signature.  Wife requests to email her Marcus [name] 's letter and copies of records sent to [facility] . E mail is "[email or web address]".

## 2021-03-22 ENCOUNTER — Telehealth: Payer: Self-pay | Admitting: Medical Oncology

## 2021-03-22 NOTE — Telephone Encounter (Signed)
I returned pts call and LVM to call me tomorrow.

## 2021-03-23 ENCOUNTER — Telehealth: Payer: Self-pay | Admitting: Medical Oncology

## 2021-03-23 NOTE — Telephone Encounter (Signed)
Returned pt call. Unable to get pt on the phone.

## 2021-06-14 ENCOUNTER — Ambulatory Visit: Payer: Medicare HMO | Admitting: Cardiovascular Disease

## 2021-12-15 IMAGING — CT CT PELVIS W/ CM
2 of 3 series · 17 of 46 positions shown, 19 images · IV contrast (omnipaque)
Comparison: Abdominal CT 04/07/2006.  Head CT 08/20/2020

CLINICAL DATA: Abdominal pain with hernia suspected. Evaluate for
left inguinal hernia.

EXAM:
CT PELVIS WITH CONTRAST
TECHNIQUE: Multidetector CT imaging of the pelvis was performed using the
standard protocol following the bolus administration of intravenous
contrast.
CONTRAST:  100mL OMNIPAQUE IOHEXOL 300 MG/ML  SOLN

[Series 2: axial st · axial · 0.91mm/px · z∈[+1088,+1363]mm · 14 of 65 slices shown, 16 images]
[im 5/65  soft-tissue]
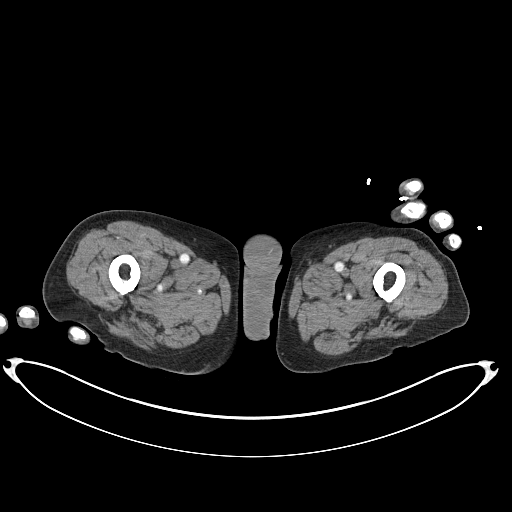
[im 5/65  bone]
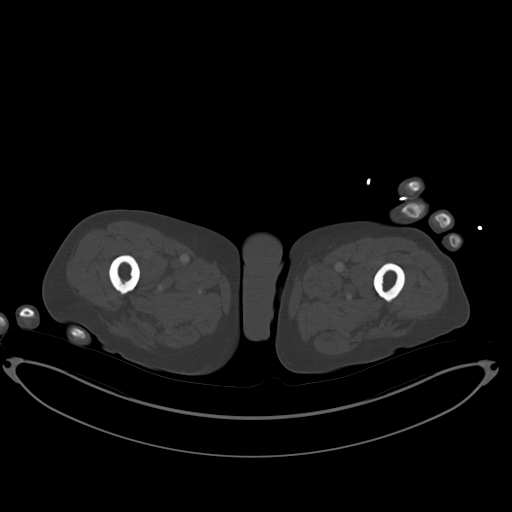
[im 9/65  soft-tissue]
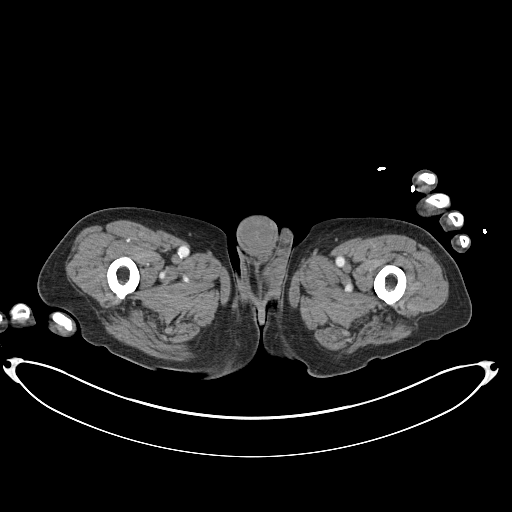
[im 13/65  soft-tissue]
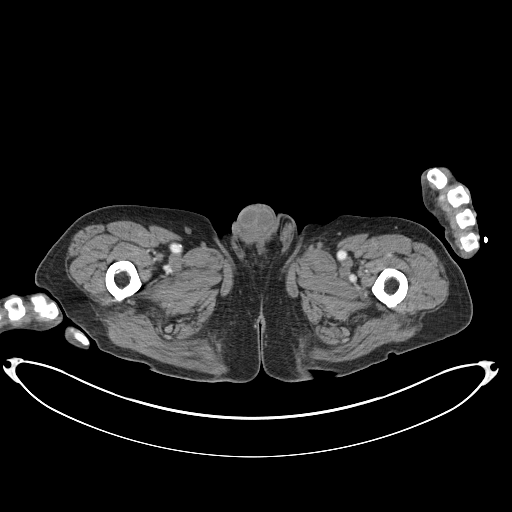
[im 17/65  soft-tissue]
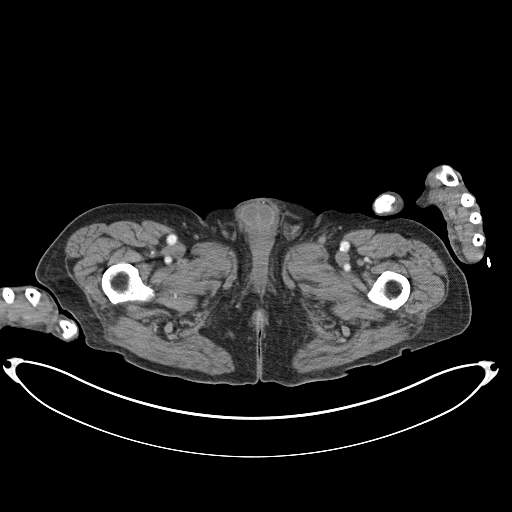
[im 21/65  soft-tissue]
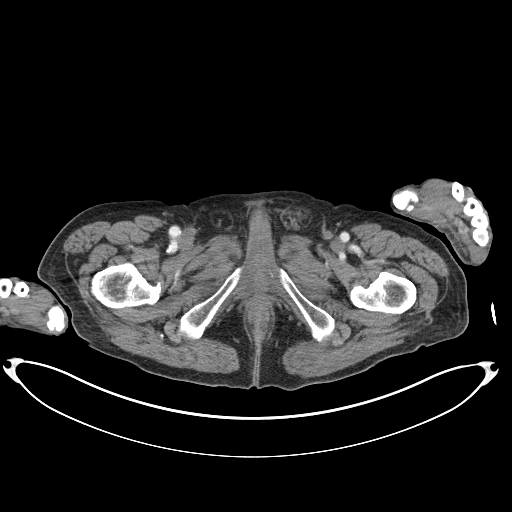
[im 25/65  soft-tissue]
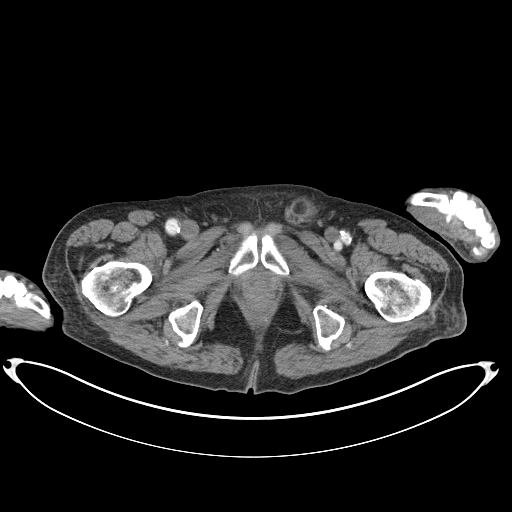
[im 29/65  soft-tissue]
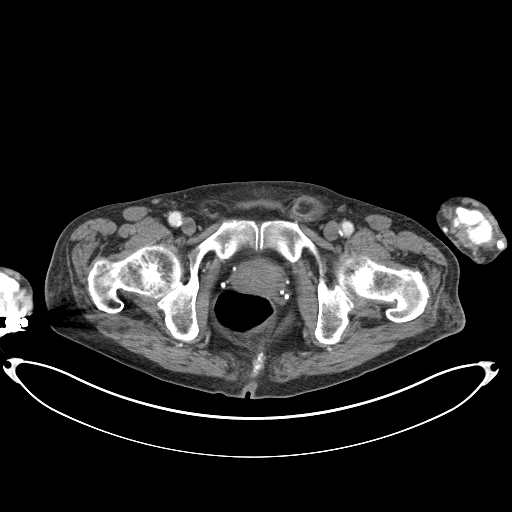
[im 36/65  soft-tissue]
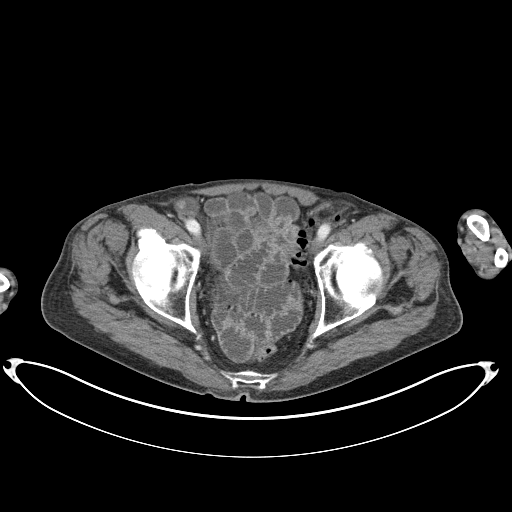
[im 40/65  soft-tissue]
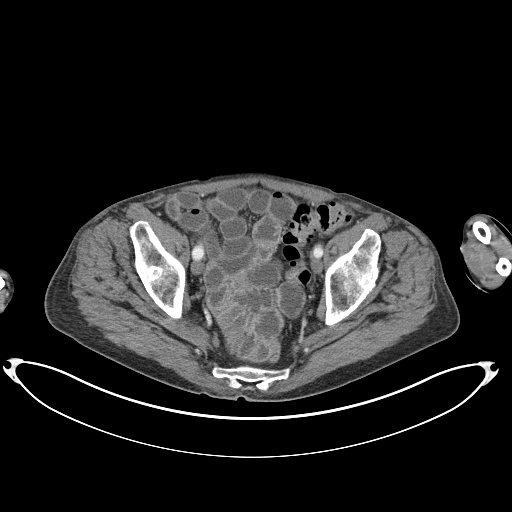
[im 40/65  bone]
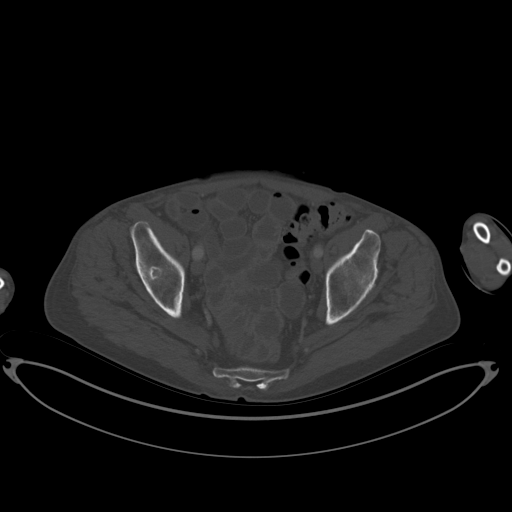
[im 44/65  soft-tissue]
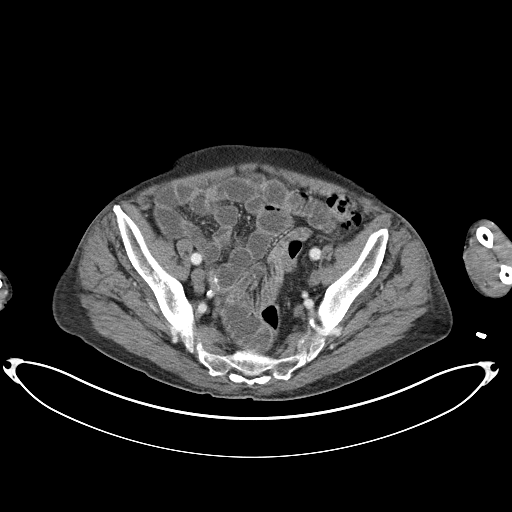
[im 48/65  soft-tissue]
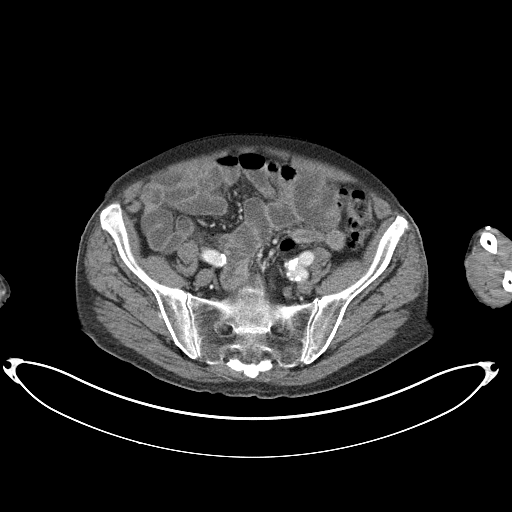
[im 52/65  soft-tissue]
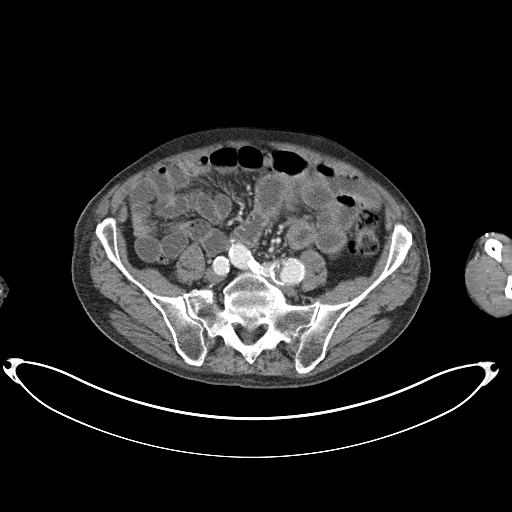
[im 56/65  soft-tissue]
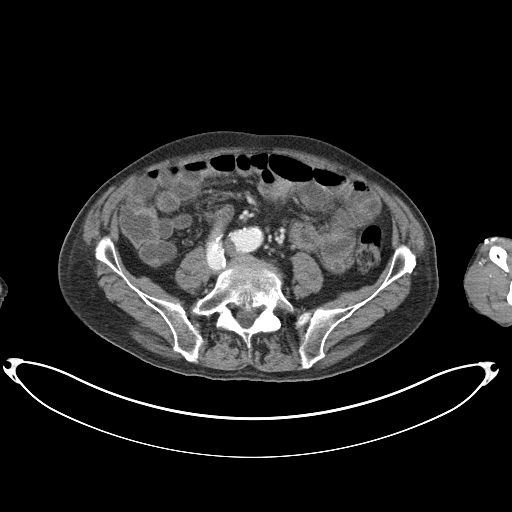
[im 60/65  soft-tissue]
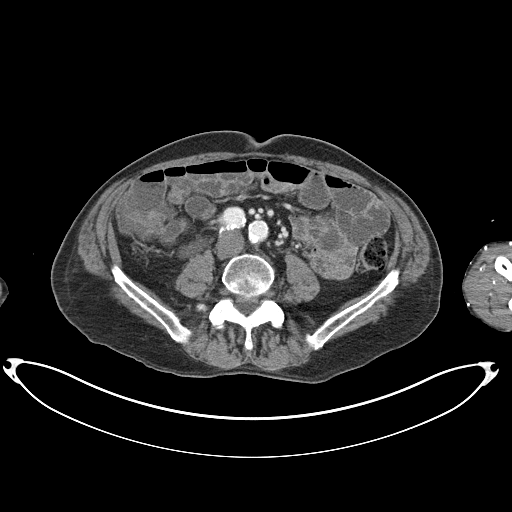

[Series 4: coronal st · coronal · 0.63mm/px · 3 of 118 slices shown]
[im 40/118  soft-tissue]
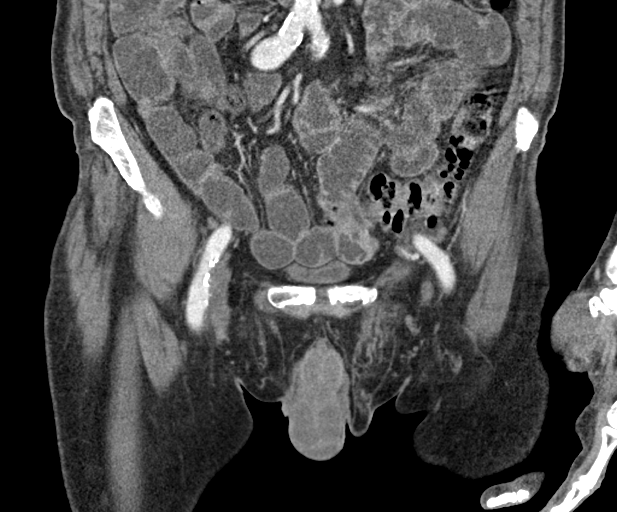
[im 53/118  soft-tissue]
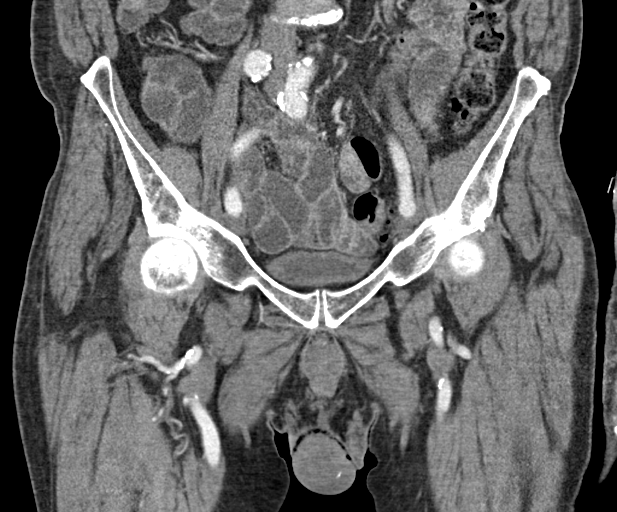
[im 66/118  soft-tissue]
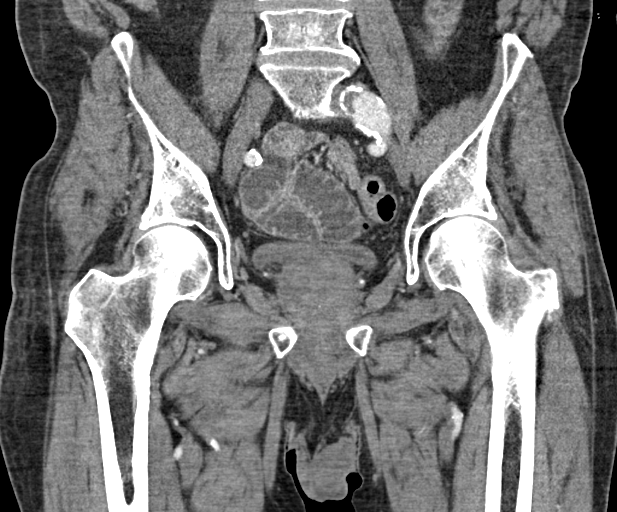

[17 of 46 positions shown; findings below may reference images not displayed]

FINDINGS: Urinary Tract:  Collapsed bladder.  No explanation for symptoms

Bowel: Left colonic diverticulosis. No visible obstruction or
inflammation

Vascular/Lymphatic: Atherosclerosis with aortic and iliac tortuosity
and chronic aneurysm at the left common iliac bifurcation which
measures 2.7 cm in diameter on coronal reformats. The aneurysm is
primarily fusiform but does have a proximally projected saccular
component which is smaller.

Reproductive:  Negative

Other: Fatty left inguinal hernia. The herniated fat has a stranded
appearance with discrete rim when compared to prior PET CT or fat
herniation was very subtle. There is fatty expansion of the right
inguinal canal without discrete hernia.

Musculoskeletal: Lumbar spine degeneration.
IMPRESSION: Fatty left inguinal hernia. The herniated fat appears congested
since prior, suggesting fat ischemia/infarct.

## 2023-02-28 NOTE — Telephone Encounter (Signed)
Telephone call
